# Patient Record
Sex: Female | Born: 1955
Health system: Southern US, Community
[De-identification: ages and names within clinical notes are randomized; demographics above are authoritative.]

## PROBLEM LIST (undated history)

## (undated) DIAGNOSIS — E8809 Other disorders of plasma-protein metabolism, not elsewhere classified: Secondary | ICD-10-CM

## (undated) DIAGNOSIS — E559 Vitamin D deficiency, unspecified: Secondary | ICD-10-CM

## (undated) DIAGNOSIS — F411 Generalized anxiety disorder: Secondary | ICD-10-CM

## (undated) DIAGNOSIS — R630 Anorexia: Secondary | ICD-10-CM

## (undated) DIAGNOSIS — R32 Unspecified urinary incontinence: Secondary | ICD-10-CM

## (undated) DIAGNOSIS — N816 Rectocele: Secondary | ICD-10-CM

## (undated) DIAGNOSIS — C9000 Multiple myeloma not having achieved remission: Secondary | ICD-10-CM

## (undated) DIAGNOSIS — G4700 Insomnia, unspecified: Secondary | ICD-10-CM

## (undated) DIAGNOSIS — E7800 Pure hypercholesterolemia, unspecified: Secondary | ICD-10-CM

## (undated) DIAGNOSIS — R5383 Other fatigue: Secondary | ICD-10-CM

## (undated) DIAGNOSIS — R609 Edema, unspecified: Secondary | ICD-10-CM

## (undated) DIAGNOSIS — R338 Other retention of urine: Secondary | ICD-10-CM

## (undated) DIAGNOSIS — I519 Heart disease, unspecified: Secondary | ICD-10-CM

## (undated) DIAGNOSIS — C801 Malignant (primary) neoplasm, unspecified: Secondary | ICD-10-CM

## (undated) DIAGNOSIS — K219 Gastro-esophageal reflux disease without esophagitis: Secondary | ICD-10-CM

## (undated) DIAGNOSIS — M5137 Other intervertebral disc degeneration, lumbosacral region: Secondary | ICD-10-CM

## (undated) DIAGNOSIS — M2170 Unequal limb length (acquired), unspecified site: Secondary | ICD-10-CM

## (undated) DIAGNOSIS — I351 Nonrheumatic aortic (valve) insufficiency: Secondary | ICD-10-CM

## (undated) DIAGNOSIS — G2581 Restless legs syndrome: Secondary | ICD-10-CM

## (undated) DIAGNOSIS — R112 Nausea with vomiting, unspecified: Secondary | ICD-10-CM

## (undated) DIAGNOSIS — M51379 Other intervertebral disc degeneration, lumbosacral region without mention of lumbar back pain or lower extremity pain: Secondary | ICD-10-CM

## (undated) DIAGNOSIS — M5030 Other cervical disc degeneration, unspecified cervical region: Secondary | ICD-10-CM

## (undated) DIAGNOSIS — Z9889 Other specified postprocedural states: Secondary | ICD-10-CM

## (undated) DIAGNOSIS — G4730 Sleep apnea, unspecified: Secondary | ICD-10-CM

## (undated) DIAGNOSIS — K760 Fatty (change of) liver, not elsewhere classified: Secondary | ICD-10-CM

## (undated) DIAGNOSIS — M47812 Spondylosis without myelopathy or radiculopathy, cervical region: Secondary | ICD-10-CM

## (undated) DIAGNOSIS — D649 Anemia, unspecified: Secondary | ICD-10-CM

## (undated) DIAGNOSIS — K5900 Constipation, unspecified: Secondary | ICD-10-CM

## (undated) DIAGNOSIS — I340 Nonrheumatic mitral (valve) insufficiency: Secondary | ICD-10-CM

## (undated) DIAGNOSIS — J343 Hypertrophy of nasal turbinates: Secondary | ICD-10-CM

## (undated) DIAGNOSIS — I253 Aneurysm of heart: Secondary | ICD-10-CM

## (undated) DIAGNOSIS — R7303 Prediabetes: Secondary | ICD-10-CM

## (undated) DIAGNOSIS — D219 Benign neoplasm of connective and other soft tissue, unspecified: Secondary | ICD-10-CM

## (undated) DIAGNOSIS — N9989 Other postprocedural complications and disorders of genitourinary system: Secondary | ICD-10-CM

## (undated) DIAGNOSIS — Z87448 Personal history of other diseases of urinary system: Secondary | ICD-10-CM

## (undated) DIAGNOSIS — R600 Localized edema: Secondary | ICD-10-CM

## (undated) DIAGNOSIS — R109 Unspecified abdominal pain: Secondary | ICD-10-CM

## (undated) DIAGNOSIS — D892 Hypergammaglobulinemia, unspecified: Secondary | ICD-10-CM

## (undated) DIAGNOSIS — R911 Solitary pulmonary nodule: Secondary | ICD-10-CM

## (undated) DIAGNOSIS — M47816 Spondylosis without myelopathy or radiculopathy, lumbar region: Secondary | ICD-10-CM

## (undated) DIAGNOSIS — R87629 Unspecified abnormal cytological findings in specimens from vagina: Secondary | ICD-10-CM

## (undated) DIAGNOSIS — N390 Urinary tract infection, site not specified: Secondary | ICD-10-CM

## (undated) DIAGNOSIS — J342 Deviated nasal septum: Secondary | ICD-10-CM

## (undated) DIAGNOSIS — T63441A Toxic effect of venom of bees, accidental (unintentional), initial encounter: Secondary | ICD-10-CM

## (undated) DIAGNOSIS — I071 Rheumatic tricuspid insufficiency: Secondary | ICD-10-CM

## (undated) DIAGNOSIS — R55 Syncope and collapse: Secondary | ICD-10-CM

## (undated) DIAGNOSIS — E785 Hyperlipidemia, unspecified: Secondary | ICD-10-CM

## (undated) DIAGNOSIS — G43109 Migraine with aura, not intractable, without status migrainosus: Secondary | ICD-10-CM

## (undated) DIAGNOSIS — G43909 Migraine, unspecified, not intractable, without status migrainosus: Secondary | ICD-10-CM

## (undated) DIAGNOSIS — Z973 Presence of spectacles and contact lenses: Secondary | ICD-10-CM

## (undated) DIAGNOSIS — I5189 Other ill-defined heart diseases: Secondary | ICD-10-CM

## (undated) DIAGNOSIS — R253 Fasciculation: Secondary | ICD-10-CM

## (undated) HISTORY — DX: Rectocele: N81.6

## (undated) HISTORY — DX: Anorexia: R63.0

## (undated) HISTORY — DX: Insomnia, unspecified: G47.00

## (undated) HISTORY — DX: Unspecified abnormal cytological findings in specimens from vagina: R87.629

## (undated) HISTORY — DX: Generalized anxiety disorder: F41.1

## (undated) HISTORY — DX: Prediabetes: R73.03

## (undated) HISTORY — DX: Pure hypercholesterolemia, unspecified: E78.00

## (undated) HISTORY — DX: Multiple myeloma not having achieved remission: C90.00

## (undated) HISTORY — DX: Constipation, unspecified: K59.00

## (undated) HISTORY — DX: Migraine with aura, not intractable, without status migrainosus: G43.109

## (undated) HISTORY — DX: Vitamin D deficiency, unspecified: E55.9

## (undated) HISTORY — DX: Unspecified abdominal pain: R10.9

## (undated) HISTORY — DX: Benign neoplasm of connective and other soft tissue, unspecified: D21.9

## (undated) HISTORY — PX: WISDOM TOOTH EXTRACTION: SHX21

## (undated) HISTORY — DX: Unequal limb length (acquired), unspecified site: M21.70

## (undated) HISTORY — DX: Unspecified urinary incontinence: R32

## (undated) HISTORY — DX: Migraine, unspecified, not intractable, without status migrainosus: G43.909

## (undated) HISTORY — DX: Toxic effect of venom of bees, accidental (unintentional), initial encounter: T63.441A

## (undated) HISTORY — PX: COLONOSCOPY: SHX174

## (undated) HISTORY — DX: Malignant (primary) neoplasm, unspecified: C80.1

## (undated) HISTORY — PX: BREAST BIOPSY: SHX20

## (undated) HISTORY — DX: Urinary tract infection, site not specified: N39.0

## (undated) HISTORY — DX: Other fatigue: R53.83

---

## 1995-09-26 HISTORY — PX: ABDOMINAL HYSTERECTOMY: SHX81

## 1997-09-25 DIAGNOSIS — C801 Malignant (primary) neoplasm, unspecified: Secondary | ICD-10-CM

## 1997-09-25 HISTORY — PX: THYMECTOMY: SHX1063

## 1997-09-25 HISTORY — DX: Malignant (primary) neoplasm, unspecified: C80.1

## 1998-08-13 ENCOUNTER — Ambulatory Visit (HOSPITAL_COMMUNITY): Admission: RE | Admit: 1998-08-13 | Discharge: 1998-08-13 | Payer: Self-pay | Admitting: Endocrinology

## 1998-08-13 ENCOUNTER — Encounter: Payer: Self-pay | Admitting: Endocrinology

## 1998-08-25 ENCOUNTER — Inpatient Hospital Stay (HOSPITAL_COMMUNITY): Admission: AD | Admit: 1998-08-25 | Discharge: 1998-08-28 | Payer: Self-pay | Admitting: General Surgery

## 1998-08-27 ENCOUNTER — Encounter: Payer: Self-pay | Admitting: Endocrinology

## 1998-09-02 ENCOUNTER — Encounter: Payer: Self-pay | Admitting: Thoracic Surgery

## 1998-09-02 ENCOUNTER — Inpatient Hospital Stay (HOSPITAL_COMMUNITY): Admission: RE | Admit: 1998-09-02 | Discharge: 1998-09-07 | Payer: Self-pay | Admitting: Thoracic Surgery

## 1998-09-03 ENCOUNTER — Encounter: Payer: Self-pay | Admitting: Thoracic Surgery

## 1998-09-04 ENCOUNTER — Encounter: Payer: Self-pay | Admitting: Thoracic Surgery

## 1998-09-05 ENCOUNTER — Encounter: Payer: Self-pay | Admitting: Thoracic Surgery

## 1998-09-06 ENCOUNTER — Encounter: Payer: Self-pay | Admitting: Thoracic Surgery

## 1998-09-07 ENCOUNTER — Encounter: Payer: Self-pay | Admitting: Thoracic Surgery

## 1998-09-08 ENCOUNTER — Encounter: Admission: RE | Admit: 1998-09-08 | Discharge: 1998-12-07 | Payer: Self-pay | Admitting: Radiation Oncology

## 1998-12-08 ENCOUNTER — Encounter: Admission: RE | Admit: 1998-12-08 | Discharge: 1999-03-08 | Payer: Self-pay | Admitting: Radiation Oncology

## 1999-08-26 ENCOUNTER — Encounter: Admission: RE | Admit: 1999-08-26 | Discharge: 1999-08-26 | Payer: Self-pay | Admitting: Hematology and Oncology

## 1999-08-26 ENCOUNTER — Encounter: Payer: Self-pay | Admitting: Hematology and Oncology

## 1999-09-08 ENCOUNTER — Ambulatory Visit (HOSPITAL_COMMUNITY): Admission: RE | Admit: 1999-09-08 | Discharge: 1999-09-08 | Payer: Self-pay | Admitting: Hematology and Oncology

## 1999-09-08 ENCOUNTER — Encounter: Payer: Self-pay | Admitting: Hematology and Oncology

## 1999-09-21 ENCOUNTER — Encounter: Admission: RE | Admit: 1999-09-21 | Discharge: 1999-11-07 | Payer: Self-pay | Admitting: Neurological Surgery

## 2000-01-03 ENCOUNTER — Encounter: Payer: Self-pay | Admitting: Obstetrics and Gynecology

## 2000-01-03 ENCOUNTER — Encounter: Admission: RE | Admit: 2000-01-03 | Discharge: 2000-01-03 | Payer: Self-pay | Admitting: Obstetrics and Gynecology

## 2000-02-24 ENCOUNTER — Other Ambulatory Visit: Admission: RE | Admit: 2000-02-24 | Discharge: 2000-02-24 | Payer: Self-pay | Admitting: Obstetrics and Gynecology

## 2000-02-28 ENCOUNTER — Encounter: Payer: Self-pay | Admitting: Hematology & Oncology

## 2000-02-28 ENCOUNTER — Encounter: Admission: RE | Admit: 2000-02-28 | Discharge: 2000-02-28 | Payer: Self-pay | Admitting: Hematology & Oncology

## 2000-09-04 ENCOUNTER — Encounter: Payer: Self-pay | Admitting: Hematology & Oncology

## 2000-09-04 ENCOUNTER — Encounter: Admission: RE | Admit: 2000-09-04 | Discharge: 2000-09-04 | Payer: Self-pay | Admitting: Hematology & Oncology

## 2001-02-20 ENCOUNTER — Encounter: Admission: RE | Admit: 2001-02-20 | Discharge: 2001-02-20 | Payer: Self-pay | Admitting: Hematology & Oncology

## 2001-02-20 ENCOUNTER — Encounter: Payer: Self-pay | Admitting: Hematology & Oncology

## 2001-03-27 ENCOUNTER — Other Ambulatory Visit: Admission: RE | Admit: 2001-03-27 | Discharge: 2001-03-27 | Payer: Self-pay | Admitting: Obstetrics and Gynecology

## 2001-08-12 ENCOUNTER — Encounter: Payer: Self-pay | Admitting: Hematology & Oncology

## 2001-08-12 ENCOUNTER — Encounter: Admission: RE | Admit: 2001-08-12 | Discharge: 2001-08-12 | Payer: Self-pay | Admitting: Hematology & Oncology

## 2001-09-04 ENCOUNTER — Encounter: Payer: Self-pay | Admitting: Thoracic Surgery

## 2001-09-04 ENCOUNTER — Encounter: Admission: RE | Admit: 2001-09-04 | Discharge: 2001-09-04 | Payer: Self-pay | Admitting: Thoracic Surgery

## 2002-02-04 ENCOUNTER — Encounter: Admission: RE | Admit: 2002-02-04 | Discharge: 2002-02-04 | Payer: Self-pay | Admitting: Family Medicine

## 2002-02-04 ENCOUNTER — Encounter: Payer: Self-pay | Admitting: Family Medicine

## 2002-03-05 ENCOUNTER — Encounter: Payer: Self-pay | Admitting: Thoracic Surgery

## 2002-03-05 ENCOUNTER — Encounter: Admission: RE | Admit: 2002-03-05 | Discharge: 2002-03-05 | Payer: Self-pay | Admitting: Thoracic Surgery

## 2002-04-04 ENCOUNTER — Encounter: Payer: Self-pay | Admitting: Family Medicine

## 2002-04-04 ENCOUNTER — Encounter: Admission: RE | Admit: 2002-04-04 | Discharge: 2002-04-04 | Payer: Self-pay | Admitting: Family Medicine

## 2002-09-23 ENCOUNTER — Encounter: Admission: RE | Admit: 2002-09-23 | Discharge: 2002-09-23 | Payer: Self-pay | Admitting: Thoracic Surgery

## 2002-09-23 ENCOUNTER — Encounter: Payer: Self-pay | Admitting: Thoracic Surgery

## 2002-10-29 ENCOUNTER — Encounter: Admission: RE | Admit: 2002-10-29 | Discharge: 2002-10-29 | Payer: Self-pay | Admitting: Obstetrics and Gynecology

## 2002-10-29 ENCOUNTER — Encounter: Payer: Self-pay | Admitting: Obstetrics and Gynecology

## 2003-03-26 ENCOUNTER — Encounter: Payer: Self-pay | Admitting: Thoracic Surgery

## 2003-03-26 ENCOUNTER — Encounter: Admission: RE | Admit: 2003-03-26 | Discharge: 2003-03-26 | Payer: Self-pay | Admitting: Thoracic Surgery

## 2003-09-29 ENCOUNTER — Encounter: Admission: RE | Admit: 2003-09-29 | Discharge: 2003-09-29 | Payer: Self-pay | Admitting: Thoracic Surgery

## 2004-04-28 ENCOUNTER — Encounter: Admission: RE | Admit: 2004-04-28 | Discharge: 2004-04-28 | Payer: Self-pay | Admitting: Hematology and Oncology

## 2004-06-13 ENCOUNTER — Ambulatory Visit (HOSPITAL_COMMUNITY): Admission: RE | Admit: 2004-06-13 | Discharge: 2004-06-13 | Payer: Self-pay | Admitting: Obstetrics and Gynecology

## 2004-10-20 ENCOUNTER — Encounter: Admission: RE | Admit: 2004-10-20 | Discharge: 2004-10-20 | Payer: Self-pay | Admitting: Hematology and Oncology

## 2004-11-16 ENCOUNTER — Ambulatory Visit (HOSPITAL_COMMUNITY): Admission: RE | Admit: 2004-11-16 | Discharge: 2004-11-16 | Payer: Self-pay | Admitting: Gastroenterology

## 2005-06-23 ENCOUNTER — Ambulatory Visit (HOSPITAL_BASED_OUTPATIENT_CLINIC_OR_DEPARTMENT_OTHER): Admission: RE | Admit: 2005-06-23 | Discharge: 2005-06-23 | Payer: Self-pay | Admitting: Family Medicine

## 2005-07-02 ENCOUNTER — Ambulatory Visit: Payer: Self-pay | Admitting: Internal Medicine

## 2005-07-26 ENCOUNTER — Ambulatory Visit (HOSPITAL_COMMUNITY): Admission: RE | Admit: 2005-07-26 | Discharge: 2005-07-26 | Payer: Self-pay | Admitting: Obstetrics and Gynecology

## 2005-10-23 ENCOUNTER — Encounter: Admission: RE | Admit: 2005-10-23 | Discharge: 2005-10-23 | Payer: Self-pay | Admitting: Hematology and Oncology

## 2006-01-24 ENCOUNTER — Encounter: Payer: Self-pay | Admitting: General Surgery

## 2006-07-16 ENCOUNTER — Ambulatory Visit: Payer: Self-pay | Admitting: Pulmonary Disease

## 2006-07-30 ENCOUNTER — Ambulatory Visit (HOSPITAL_COMMUNITY): Admission: RE | Admit: 2006-07-30 | Discharge: 2006-07-30 | Payer: Self-pay | Admitting: Obstetrics and Gynecology

## 2006-08-09 ENCOUNTER — Encounter: Admission: RE | Admit: 2006-08-09 | Discharge: 2006-08-09 | Payer: Self-pay | Admitting: Obstetrics and Gynecology

## 2006-08-15 ENCOUNTER — Ambulatory Visit: Payer: Self-pay | Admitting: Pulmonary Disease

## 2006-10-22 ENCOUNTER — Encounter: Admission: RE | Admit: 2006-10-22 | Discharge: 2006-10-22 | Payer: Self-pay | Admitting: Hematology and Oncology

## 2007-03-19 ENCOUNTER — Ambulatory Visit (HOSPITAL_BASED_OUTPATIENT_CLINIC_OR_DEPARTMENT_OTHER): Admission: RE | Admit: 2007-03-19 | Discharge: 2007-03-19 | Payer: Self-pay | Admitting: Pulmonary Disease

## 2007-03-30 ENCOUNTER — Ambulatory Visit: Payer: Self-pay | Admitting: Pulmonary Disease

## 2007-04-23 ENCOUNTER — Ambulatory Visit: Payer: Self-pay | Admitting: Pulmonary Disease

## 2007-04-23 LAB — CONVERTED CEMR LAB: Ferritin: 29.2 ng/mL (ref 10.0–291.0)

## 2007-08-12 ENCOUNTER — Ambulatory Visit (HOSPITAL_COMMUNITY): Admission: RE | Admit: 2007-08-12 | Discharge: 2007-08-12 | Payer: Self-pay | Admitting: Obstetrics and Gynecology

## 2007-08-16 ENCOUNTER — Encounter: Admission: RE | Admit: 2007-08-16 | Discharge: 2007-08-16 | Payer: Self-pay | Admitting: Obstetrics and Gynecology

## 2007-08-21 ENCOUNTER — Encounter: Admission: RE | Admit: 2007-08-21 | Discharge: 2007-08-21 | Payer: Self-pay | Admitting: Obstetrics and Gynecology

## 2007-10-25 ENCOUNTER — Encounter: Admission: RE | Admit: 2007-10-25 | Discharge: 2007-10-25 | Payer: Self-pay | Admitting: Hematology and Oncology

## 2007-11-06 ENCOUNTER — Encounter: Payer: Self-pay | Admitting: Pulmonary Disease

## 2007-12-03 DIAGNOSIS — G4733 Obstructive sleep apnea (adult) (pediatric): Secondary | ICD-10-CM

## 2007-12-03 DIAGNOSIS — C37 Malignant neoplasm of thymus: Secondary | ICD-10-CM | POA: Insufficient documentation

## 2007-12-03 DIAGNOSIS — G2581 Restless legs syndrome: Secondary | ICD-10-CM | POA: Insufficient documentation

## 2007-12-03 HISTORY — DX: Malignant neoplasm of thymus: C37

## 2007-12-03 HISTORY — DX: Obstructive sleep apnea (adult) (pediatric): G47.33

## 2007-12-04 ENCOUNTER — Ambulatory Visit: Payer: Self-pay | Admitting: Pulmonary Disease

## 2007-12-04 DIAGNOSIS — R0602 Shortness of breath: Secondary | ICD-10-CM

## 2007-12-04 DIAGNOSIS — E785 Hyperlipidemia, unspecified: Secondary | ICD-10-CM

## 2007-12-04 HISTORY — DX: Shortness of breath: R06.02

## 2007-12-04 HISTORY — DX: Hyperlipidemia, unspecified: E78.5

## 2007-12-05 ENCOUNTER — Ambulatory Visit (HOSPITAL_COMMUNITY): Admission: RE | Admit: 2007-12-05 | Discharge: 2007-12-05 | Payer: Self-pay | Admitting: Pulmonary Disease

## 2007-12-06 ENCOUNTER — Telehealth: Payer: Self-pay | Admitting: Pulmonary Disease

## 2007-12-06 ENCOUNTER — Ambulatory Visit: Admission: RE | Admit: 2007-12-06 | Discharge: 2007-12-06 | Payer: Self-pay | Admitting: Pulmonary Disease

## 2007-12-06 ENCOUNTER — Encounter: Payer: Self-pay | Admitting: Pulmonary Disease

## 2007-12-10 ENCOUNTER — Telehealth: Payer: Self-pay | Admitting: Pulmonary Disease

## 2007-12-16 ENCOUNTER — Telehealth: Payer: Self-pay | Admitting: Pulmonary Disease

## 2007-12-16 ENCOUNTER — Encounter: Payer: Self-pay | Admitting: Pulmonary Disease

## 2007-12-16 DIAGNOSIS — R0902 Hypoxemia: Secondary | ICD-10-CM | POA: Insufficient documentation

## 2007-12-16 HISTORY — DX: Hypoxemia: R09.02

## 2007-12-18 ENCOUNTER — Ambulatory Visit (HOSPITAL_COMMUNITY): Admission: RE | Admit: 2007-12-18 | Discharge: 2007-12-18 | Payer: Self-pay | Admitting: Pulmonary Disease

## 2007-12-27 ENCOUNTER — Telehealth: Payer: Self-pay | Admitting: Pulmonary Disease

## 2007-12-30 ENCOUNTER — Encounter: Payer: Self-pay | Admitting: Pulmonary Disease

## 2007-12-30 ENCOUNTER — Telehealth (INDEPENDENT_AMBULATORY_CARE_PROVIDER_SITE_OTHER): Payer: Self-pay | Admitting: *Deleted

## 2008-02-26 ENCOUNTER — Encounter: Payer: Self-pay | Admitting: Pulmonary Disease

## 2008-03-11 ENCOUNTER — Telehealth (INDEPENDENT_AMBULATORY_CARE_PROVIDER_SITE_OTHER): Payer: Self-pay | Admitting: *Deleted

## 2008-03-12 ENCOUNTER — Telehealth (INDEPENDENT_AMBULATORY_CARE_PROVIDER_SITE_OTHER): Payer: Self-pay | Admitting: *Deleted

## 2008-03-16 ENCOUNTER — Inpatient Hospital Stay (HOSPITAL_BASED_OUTPATIENT_CLINIC_OR_DEPARTMENT_OTHER): Admission: RE | Admit: 2008-03-16 | Discharge: 2008-03-16 | Payer: Self-pay | Admitting: Interventional Cardiology

## 2008-08-12 ENCOUNTER — Encounter: Admission: RE | Admit: 2008-08-12 | Discharge: 2008-08-12 | Payer: Self-pay | Admitting: Obstetrics and Gynecology

## 2008-10-21 ENCOUNTER — Encounter: Admission: RE | Admit: 2008-10-21 | Discharge: 2008-10-21 | Payer: Self-pay | Admitting: Hematology and Oncology

## 2009-11-25 ENCOUNTER — Encounter: Admission: RE | Admit: 2009-11-25 | Discharge: 2009-11-25 | Payer: Self-pay | Admitting: Hematology and Oncology

## 2009-11-29 ENCOUNTER — Encounter: Admission: RE | Admit: 2009-11-29 | Discharge: 2009-11-29 | Payer: Self-pay | Admitting: Obstetrics and Gynecology

## 2010-10-16 ENCOUNTER — Encounter: Payer: Self-pay | Admitting: Obstetrics and Gynecology

## 2010-11-03 ENCOUNTER — Other Ambulatory Visit (HOSPITAL_COMMUNITY): Payer: Self-pay | Admitting: Obstetrics and Gynecology

## 2010-11-03 DIAGNOSIS — Z1231 Encounter for screening mammogram for malignant neoplasm of breast: Secondary | ICD-10-CM

## 2010-12-06 ENCOUNTER — Ambulatory Visit (HOSPITAL_COMMUNITY): Payer: 59

## 2010-12-08 ENCOUNTER — Ambulatory Visit (HOSPITAL_COMMUNITY)
Admission: RE | Admit: 2010-12-08 | Discharge: 2010-12-08 | Disposition: A | Discharge status: Home / Self Care | Payer: 59 | Source: Ambulatory Visit | Attending: Obstetrics and Gynecology | Admitting: Obstetrics and Gynecology

## 2010-12-08 DIAGNOSIS — Z1231 Encounter for screening mammogram for malignant neoplasm of breast: Secondary | ICD-10-CM

## 2011-02-07 NOTE — Procedures (Signed)
Paula Johnson, Paula Johnson                   ACCOUNT NO.:  [id number]   MEDICAL RECORD NO.:  [id number]          PATIENT TYPE:  OUT   LOCATION:  SLEEP CENTER                 FACILITY:  [facility]   PHYSICIAN:  Barbaraann Share, MD,FCCPDATE OF BIRTH:  June 14, 1956   DATE OF STUDY:  03/19/2007                            NOCTURNAL POLYSOMNOGRAM   REFERRING PHYSICIAN:  Barbaraann Share, MD,FCCP   LOCATION:  Sleep Lab.   INDICATION FOR THE STUDY:  Hypersomnia with sleep apnea.   EPWORTH SLEEPINESS SCORE:  13.   MEDICATIONS:   SLEEP ARCHITECTURE:  The patient had a total sleep time of 375 minutes  with adequate slow-wave sleep for age but decreased REM.  Sleep-onset  latency was normal and REM onset was normal as well.  Sleep efficiency  was decreased at 83%.   RESPIRATORY DATA:  The patient was found to have 6 hypopneas and one  obstructive apnea for an apnea-hypopnea index of 1.1 events per hour.  The events were not positional and there was only light intermittent  snoring noted.  It should be noted that the patient's study was done  with a bruxism guard in place.   OXYGEN DATA:  There was O2 desaturation transiently to 86%, however no  prolonged episodes of O2 desaturation were noted.   CARDIAC DATA:  No clinically significant arrhythmias were present.   MOVEMENT-PARASOMNIA:  The patient was found to have 171 leg jerks with 6  per hour resulting in arousal or awakening.   IMPRESSIONS-RECOMMENDATIONS:  1. Small numbers of obstructive events which do not meet the apnea-      hypopnea index criteria for the obstructive sleep apnea syndrome.  2. Very large numbers of leg jerks with significant sleep disruption.      I suspect this represents a primary movement disorder of sleep and      is probably the etiology for the patient's disturbed sleep.      Barbaraann Share, MD,FCCP  Diplomate, American Board of Sleep  Medicine  Electronically Signed     KMC/MEDQ  D:  03/28/2007 18:47:09  T:   03/29/2007 10:49:09  Job:  [id number]

## 2011-02-07 NOTE — Cardiovascular Report (Signed)
Paula Johnson, Paula Johnson NO.:  [id number]   MEDICAL RECORD NO.:  [id number]          PATIENT TYPE:  OIB   LOCATION:  1963                         FACILITY:  [facility]   PHYSICIAN:  Lyn Records, M.D.   DATE OF BIRTH:  [birth year]   DATE OF PROCEDURE:  DATE OF DISCHARGE:                            CARDIAC CATHETERIZATION   INDICATIONS FOR PROCEDURE:  Ms. [name] has been having exertional dyspnea  and increased heart rate with moderate physical activity.  LV function  was demonstrated to be normal by echo and Cardiolite by anteroapical  ischemia was demonstrated by perfusion analysis.  This study is being  done to rule out the possibility of obstructive coronary disease related  to prior radiation therapy greater than 10 years ago as part of  management of a thymoma.   PROCEDURES PERFORMED:  1. Left heart cath.  2. Selective coronary angio.  3. Left ventriculography.   DESCRIPTION:  After informed consent, and no sedation at the patient's  request, a 4-French sheath was placed in the right femoral artery using  a modified Seldinger technique.  A 4-French A2 multipurpose catheter was  used for hemodynamic recordings, the left ventriculography by hand  injection, and native right coronary angiography.  The patient also  underwent left coronary angiography using a preformed 4-French #4 left  Judkins catheter.  The patient tolerated the procedure without  complications.   RESULTS:  1. Hemodynamic data.      a.     Aortic pressure 113/70.      b.     Left ventricular pressure 119/810.   1. Left ventriculography:  The LV cavity size and function are normal.      The end-diastolic pressure is elevated.  No mitral regurgitation is      noted.  EF is estimated to be 60%.   1. Coronary angiography.      a.     Left main coronary:  The left main coronary artery is       normal.      b.     Left anterior descending coronary:  LAD reaches the distal       anterior  interventricular groove.  It does not wrap around the       apex.  A large diagonal arises proximally.  This is in the same       region as origin of the large septal perforator.  LAD system is       normal..      c.     The circumflex artery:  Circumflex coronary artery contains       a large first obtuse marginal, a smaller second obtuse marginal,       and a large third obtuse marginal.  PDA arises distally from the       circumflex.  The entire circumflex system is normal.   D.  The right coronary:  The right coronary is nondominant and is free  of obstruction.   CONCLUSION:  1. Normal coronary arteries.  2. Normal left ventricular systolic function.  The  upper normal to      mildly elevated LVEDP raises the possibility of diastolic      dysfunction.  This could be related to some radiation-induced      myocardial fibrosis.   PLAN:  No restrictions on physical activities.  We encouraged exertional  aerobic activities.  Consider beta-blocker therapy for diastolic  dysfunction if exertional intolerance continues.      Lyn Records, M.D.  Electronically Signed     Lyn Records, M.D.  Electronically Signed    HWS/MEDQ  D:  03/16/2008  T:  03/16/2008  Job:  [id number]

## 2011-02-10 NOTE — Assessment & Plan Note (Signed)
[facility]                               PULMONARY OFFICE NOTE   Paula Johnson, Paula Johnson                            MRN:          [id number]  DATE:07/16/2006                            DOB:          14-Aug-1956    HISTORY OF PRESENT ILLNESS:  This is a 55 year old female whom I have been  asked to see for obstructive sleep apnea.  The patient was diagnosed with  mild to moderate sleep apnea in 2006, with a respiratory disturbance index  of 17 events per hour.  She ultimately underwent a CPAP titration to a final  pressure of 10 cm.  The patient was also noted to have very large numbers of  leg jerks with some sleep disruption.  The patient currently is on a full  face mask, due to frequent mouth opening, as well as heat and humidity.  Her  CPAP machine remains at 10 cm of pressure.  Her main complaint at this time  is that she has significant air gulping and will awake, where she is very,  very bloated.  This can lead to significant abdominal pain in the mornings.  She typically will awaken multiple times during the night with mask leaks,  as well as pain over the bridge of her nose.  She does not feel that mask  fit is adequate currently.  Prior to the CPAP, the patient states that she  did have snoring as well as snoring arousals.  No one had noted pauses in  her breathing during her sleep.  The patient did have sleep pressure  appearance of inactivity and would often take a nap in her car at lunch.  She has had no significant symptoms of restless leg syndrome and no one has  ever commented to her that she had difficulty with leg jerks during the  night; however, she does not have a bed partner currently.  She will  typically go to bed between 11 and 11:30, and get up at 6 a.m. to start her  day.  She is not rested upon arising prior to CPAP.   PAST MEDICAL HISTORY:  1. Significant for a history of thymic cancer with prior resection.  2. Status post  hysterectomy.   CURRENT MEDICATIONS:  1. A multivitamin daily.  2. Calcium daily.   ALLERGIES:  PENICILLIN.   SOCIAL HISTORY:  She is married and has children.  She has never smoked.   FAMILY HISTORY:  Remarkable for her father having had emphysema, as well as  heart disease.  Her son also had asthma and allergies.   REVIEW OF SYSTEMS:  As per the history of present illness, also as  documented in the chart.   PHYSICAL EXAMINATION:  GENERAL:  She is an overweight white female, in no  acute distress.  VITAL SIGNS:  Blood pressure 126/78, pulse 77, temperature 98.2 degrees,  weight 154 pounds.  O2 saturation on room air is 98%.  HEENT:  Pupils equal, round, reactive to light and accommodation.  Extraocular muscles intact.  Nares show mild septal deviation.  Oropharynx  shows normal uvula and palate.  NECK:  Supple, without jugular venous distention or lymphadenopathy.  There  is no palpable thyromegaly.  CHEST:  Totally clear.  HEART:  A regular rate and rhythm.  No murmurs, rubs or gallops.  ABDOMEN:  Soft, nontender with good bowel sounds.  GENITOURINARY/RECTAL/BREASTS:  Exam was not done and not indicated.  EXTREMITIES:  The lower extremities are without edema.  Good pulses  distally.  There is no calf tenderness.  NEUROLOGIC:  Alert and oriented, without any obvious motor deficits.   IMPRESSION:  Mild to moderate obstructive sleep apnea in the past, with  difficulty tolerating CPAP, secondary to air gulping with bloating and  abdominal pain.  Perhaps the patient would do better with a nasal mask and  the addition of a chin strap to help with mouth opening.  Patients who use a  full face mask tend to have a little bit more air gulping than those who do  not.  I also had a long discussion with the patient about the use of an oral  appliance for mild to moderate sleep apnea, and how some patients do very  well with this.  She will look this up on the Internet and give me a call  if  she is interested.   PLAN:  1. Will change the patient to a nasal mask with chin strap and decrease      her pressure to 8 cm for now, to help with air gulping and see if we      can improve tolerance.  2. The patient will do some research on her oral appliance and give me a      call if she is interested.  3. The patient will follow up in four weeks, or sooner if there are      problems.    ______________________________  Barbaraann Share, MD,FCCP    [facility]/MedQ  DD: 07/30/2006  DT: 07/31/2006  Job #: [id number]   cc:   Onalee Hua L. Annalee Genta, M.D.  Danielle L. Mahaffey, M.D.

## 2011-02-10 NOTE — Assessment & Plan Note (Signed)
[facility]                             PULMONARY OFFICE NOTE   [name], [name]                          MRN:          [id number]  DATE:04/23/2007                            DOB:          01-08-1956    SUBJECTIVE:  [name] comes in today for followup after her fitting for  an oral appliance and a follow-up sleep study.  This was done on March 19, 2007, where the patient was found to have six hypopneas and one  obstructive apnea for an apnea/hypopnea index of 1.1 per hour.  This is  much improved from her prior study that showed 17 events per hour in  2006.  The patient then wearing the oral appliance and is quite happy  with it.  She is sleeping well and has increased energy and alertness  during the day.  She was found on her follow-up study to have 171 leg  jerks with six per hour, resulting in arousal or awakening.  When I  questioned her closer, she does have symptoms that are classic with a  restless leg syndrome.   PHYSICAL EXAMINATION:  GENERAL:  She is a thin white female, in no acute  distress.  VITAL SIGNS:  Blood pressure 118/70, pulse 85, temperature 97.8 degrees,  weight 147 pounds.  O2 saturation on room air 97%.   IMPRESSION:  1. Obstructive sleep apnea, which has responded very nicely to therapy      with oral appliance:  The patient seems to be doing very well with      this and will continue.  Follow-up study shows no clinically      significant sleep disordered breathing.  2. Restless leg syndrome:  The patient has a classic history for this      and very large numbers of leg jerks on her most recent sleep study.      I really think that we need to give her a trial of Requip, to see      if this will help.  Will start it in an escalating fashion, up to 1      mg q.h.s.  She is to call me in approximately two to three weeks      and let me know how things have gone on this.   PLAN:  1. Start Requip in an escalating dose to 1  mg q.h.s.  The patient is      to give me followup on how things are going.  2. Check iron panel, in light of her restless leg syndrome.  3. The patient will follow up with Dr. [name] as needed for      maintenance of her oral appliance.  4. She is to call me if she has any return of symptoms, consistent      with sleep disordered breathing.     Barbaraann Share, MD,FCCP  Electronically Signed   KMC/MedQ  DD: 05/07/2007  DT: 05/08/2007  Job #: [id number]   cc:   [name], M.D.  Kinnie Scales. Annalee Genta, M.D.  Danielle L.  Mahaffey, M.D.

## 2011-02-10 NOTE — Op Note (Signed)
NAMECHEROKEE, BOCCIO                   ACCOUNT NO.:  [id number]   MEDICAL RECORD NO.:  [id number]          PATIENT TYPE:  AMB   LOCATION:  ENDO                         FACILITY:  [facility]   PHYSICIAN:  Anselmo Rod, M.D.  DATE OF BIRTH:  1956/05/16   DATE OF PROCEDURE:  11/16/2004  DATE OF DISCHARGE:                                 OPERATIVE REPORT   PROCEDURE PERFORMED:  Screening colonoscopy.   ENDOSCOPIST:  Charna Elizabeth, M.D.   INSTRUMENT USED:  Olympus video colonoscope.   INDICATIONS FOR PROCEDURE:  The patient is a 55 year old white female with a  personal history of cancer of the thymus (squamous cell carcinoma)  undergoing screening colonoscopy.  She has had a history of guaiac positive  stools as well.  Rule out colonic polyps, masses, etc.   PREPROCEDURE PREPARATION:  Informed consent was procured from the patient.  The patient was fasted for eight hours prior to the procedure and prepped  with a bottle of magnesium citrate and a gallon of GoLYTELY the night prior  to the procedure.  The risks and benefits of the procedure including a 10%  miss rate for colon polyps or cancers was discussed with the patient as  well.   PREPROCEDURE PHYSICAL:  The patient had stable vital signs.  Neck supple.  Surgical scar present at the base of the neck from previous thymectomy.  Chest clear to auscultation.  S1 and S2 regular.  Abdomen soft with normal  bowel sounds.   DESCRIPTION OF PROCEDURE:  The patient was placed in left lateral decubitus  position and sedated with 75 mg of Demerol and 7.5 mg of Versed in slow  incremental doses.  Once the patient was adequately sedated and maintained  on low flow oxygen and continuous cardiac monitoring, the Olympus video  colonoscope was advanced from the rectum with difficulty.  There was a large  amount of residual stool in the colon. Multiple washes were done.  Small  lesions could have been missed.  No masses, polyps, erosions or ulcerations  or diverticula were seen.  Retroflexion in the rectum revealed no  abnormalities.   IMPRESSION:  1.  Unrevealing colonoscopy up to the cecum.  2.  Significant amount of residual stool in the colon.  Multiple washes      done, small lesions could have been missed.   RECOMMENDATIONS:  1.  Repeat colonoscopy is recommended in the next five years unless the      patient develops any abnormal symptoms in the interim.  2.  Repeat guaiacs will be done in the near future and recommendations made      thereafter.      JNM/MEDQ  D:  11/16/2004  T:  11/16/2004  Job:  [id number]   cc:   Duwayne Heck L. Adonis Housekeeper, M.D.  [address]  [place] [address]  [place] [address]  Fax: [phone]   Malachi Pro. Ambrose Mantle, M.D.  [address]  [place] [address]  Fax: [phone]   Lynett Fish, M.D.  [address]  [place]  [place] [address]  Fax: [phone]

## 2011-02-10 NOTE — Procedures (Signed)
Paula Johnson, Paula Johnson                   ACCOUNT NO.:  [id number]   MEDICAL RECORD NO.:  [id number]          PATIENT TYPE:  OUT   LOCATION:  SLEEP CENTER                 FACILITY:  [facility]   PHYSICIAN:  Clinton D. Maple Hudson, M.D. DATE OF BIRTH:  [birth year]   DATE OF STUDY:  06/23/2005                              NOCTURNAL POLYSOMNOGRAM   REFERRING PHYSICIAN:  Dr. Doreatha Lew   DATE OF STUDY:  June 23, 2005   INDICATION FOR STUDY:  Insomnia with sleep apnea. Epworth sleepiness score  14/24, BMI 21.4, weight 145 pounds.   SLEEP ARCHITECTURE:  Total sleep time 329 minutes with sleep efficiency 75%.  Stage I was 19%, stage II 55%, stages III and IV 17%, REM 9% of total sleep  time. Sleep latency 5 minutes, REM latency 72 minutes, awake after sleep  onset 103 minutes, arousal index 61.6 which is increased. No bedtime  medication taken.   RESPIRATORY DATA:  Split study protocol. Apnea hypopnea index (AHI, RDI)  16.6 obstructive events per hour indicating moderate obstructive sleep  apnea/hypopnea syndrome before CPAP. There were 5 obstructive apneas and 38  hypopneas before CPAP. Events were not positional. REM AHI 2.1. CPAP was  titrated to 10 CWP, AHI 0 per hour. A petite Respironics ComfortGel nasal  mask was used with heated humidifier. Titration was difficult because of  frequent movement arousals, see below.   OXYGEN DATA:  Mild to moderate snoring with oxygen desaturation to a nadir  of 91% before CPAP. Mean oxygen saturation with CPAP control was 97% on room  air.   CARDIAC DATA:  Normal sinus rhythm.   MOVEMENT/PARASOMNIA:  A total of 246 limb jerks were recorded of which 66  were associated with arousal or awakening for an abnormal periodic limb  movement with arousal index of 12 per hour.   IMPRESSION/RECOMMENDATION:  1.  Moderate obstructive sleep apnea/hypopnea syndrome, AHI 16.6 per hour      with mild to moderate snoring and normal oxygenation. Events were not   positional.  2.  Successful continuous positive airway pressure titration to 10 CWP, AHI      0 per hour. A petite Respironics ComfortGel nasal mask was used with      heated humidifier.  3.  Significant periodic limb movement with arousal syndrome, 12 per hour.      This score may be increased during sleep disturbance associated with      continuous positive airway pressure titration but it is still likely to      reflect a significant factor affecting sleep. She may benefit from use      of a sleeping pill during initial home trial of      continuous positive airway pressure. Consider reevaluating leg jerks      clinically once she has adjusted to continuous positive airway pressure      to determine whether ongoing therapy is needed for this problem as well.      Clinton D. Maple Hudson, M.D.  Diplomate, Biomedical engineer of Sleep Medicine  Electronically Signed     CDY/MEDQ  D:  07/02/2005 84:13:24  T:  07/02/2005 10:53:07  Job:  [id number]

## 2011-11-08 ENCOUNTER — Other Ambulatory Visit (HOSPITAL_COMMUNITY): Payer: Self-pay | Admitting: Obstetrics and Gynecology

## 2011-11-08 DIAGNOSIS — Z1231 Encounter for screening mammogram for malignant neoplasm of breast: Secondary | ICD-10-CM

## 2011-12-12 ENCOUNTER — Ambulatory Visit (HOSPITAL_COMMUNITY)
Admission: RE | Admit: 2011-12-12 | Discharge: 2011-12-12 | Disposition: A | Discharge status: Home / Self Care | Payer: 59 | Source: Ambulatory Visit | Attending: Obstetrics and Gynecology | Admitting: Obstetrics and Gynecology

## 2011-12-12 DIAGNOSIS — Z1231 Encounter for screening mammogram for malignant neoplasm of breast: Secondary | ICD-10-CM | POA: Insufficient documentation

## 2011-12-25 ENCOUNTER — Ambulatory Visit
Admission: RE | Admit: 2011-12-25 | Discharge: 2011-12-25 | Disposition: A | Discharge status: Home / Self Care | Payer: 59 | Source: Ambulatory Visit | Attending: Hematology and Oncology | Admitting: Hematology and Oncology

## 2011-12-25 ENCOUNTER — Other Ambulatory Visit: Payer: Self-pay | Admitting: Hematology and Oncology

## 2011-12-25 DIAGNOSIS — C37 Malignant neoplasm of thymus: Secondary | ICD-10-CM

## 2012-10-08 ENCOUNTER — Other Ambulatory Visit: Payer: Self-pay | Admitting: Dermatology

## 2013-01-22 ENCOUNTER — Other Ambulatory Visit (HOSPITAL_COMMUNITY): Payer: Self-pay | Admitting: Obstetrics and Gynecology

## 2013-01-22 DIAGNOSIS — Z1231 Encounter for screening mammogram for malignant neoplasm of breast: Secondary | ICD-10-CM

## 2013-01-23 ENCOUNTER — Ambulatory Visit (HOSPITAL_COMMUNITY)
Admission: RE | Admit: 2013-01-23 | Discharge: 2013-01-23 | Disposition: A | Discharge status: Home / Self Care | Payer: 59 | Source: Ambulatory Visit | Attending: Obstetrics and Gynecology | Admitting: Obstetrics and Gynecology

## 2013-01-23 DIAGNOSIS — Z1231 Encounter for screening mammogram for malignant neoplasm of breast: Secondary | ICD-10-CM | POA: Insufficient documentation

## 2013-01-27 ENCOUNTER — Other Ambulatory Visit: Payer: Self-pay | Admitting: Obstetrics and Gynecology

## 2013-01-27 DIAGNOSIS — R928 Other abnormal and inconclusive findings on diagnostic imaging of breast: Secondary | ICD-10-CM

## 2013-02-06 ENCOUNTER — Other Ambulatory Visit: Payer: Self-pay | Admitting: Obstetrics and Gynecology

## 2013-02-06 ENCOUNTER — Ambulatory Visit
Admission: RE | Admit: 2013-02-06 | Discharge: 2013-02-06 | Disposition: A | Discharge status: Home / Self Care | Payer: 59 | Source: Ambulatory Visit | Attending: Obstetrics and Gynecology | Admitting: Obstetrics and Gynecology

## 2013-02-06 DIAGNOSIS — R928 Other abnormal and inconclusive findings on diagnostic imaging of breast: Secondary | ICD-10-CM

## 2013-02-11 ENCOUNTER — Other Ambulatory Visit: Payer: Self-pay | Admitting: Obstetrics and Gynecology

## 2013-02-11 ENCOUNTER — Ambulatory Visit
Admission: RE | Admit: 2013-02-11 | Discharge: 2013-02-11 | Disposition: A | Discharge status: Home / Self Care | Payer: 59 | Source: Ambulatory Visit | Attending: Obstetrics and Gynecology | Admitting: Obstetrics and Gynecology

## 2013-02-11 DIAGNOSIS — R928 Other abnormal and inconclusive findings on diagnostic imaging of breast: Secondary | ICD-10-CM

## 2015-01-11 ENCOUNTER — Encounter: Payer: Self-pay | Admitting: Obstetrics and Gynecology

## 2015-01-27 ENCOUNTER — Encounter: Payer: Self-pay | Admitting: Obstetrics and Gynecology

## 2015-01-27 ENCOUNTER — Ambulatory Visit (INDEPENDENT_AMBULATORY_CARE_PROVIDER_SITE_OTHER): Payer: 59 | Admitting: Obstetrics and Gynecology

## 2015-01-27 VITALS — BP 120/74 | HR 80 | Resp 16 | Ht 68.0 in | Wt 132.0 lb

## 2015-01-27 DIAGNOSIS — N993 Prolapse of vaginal vault after hysterectomy: Secondary | ICD-10-CM

## 2015-01-27 DIAGNOSIS — N8110 Cystocele, unspecified: Secondary | ICD-10-CM

## 2015-01-27 DIAGNOSIS — IMO0002 Reserved for concepts with insufficient information to code with codable children: Secondary | ICD-10-CM

## 2015-01-27 DIAGNOSIS — N393 Stress incontinence (female) (male): Secondary | ICD-10-CM

## 2015-01-27 DIAGNOSIS — N816 Rectocele: Secondary | ICD-10-CM | POA: Diagnosis not present

## 2015-01-27 NOTE — Progress Notes (Signed)
Patient ID: Paula Johnson, female   DOB: 1956/03/28, 59 y.o.   MRN: [id number] GYNECOLOGY  VISIT   HPI: 59 y.o.   Married  Caucasian  female   G2P2002 with No LMP recorded. Patient has had a hysterectomy.   here for evaluation of vaginal prolapse which is progressive. Status post TAH - fibroids - 1997 - Dr. Ulanda Edison. Ovaries remain.   Pushes the prolapse back up.  Some decrease in stream of flow of urine.  Uncertain if emptying completely.  Told she has a cystocele and rectocele.  After a BM has to replace the prolapse.  No loss of control of stool.  Occasional leak of urine with sneeze or exercise such as yoga.  Just a little leak.   History of microscopic hematuria on occasion.  Told unremarkable by urology. History of UTIs in past.  Saw Dr. Terance Hart for this 20 years ago and was on abx for 3 months.  History of radiation to thymus due to malignancy.  Told she has already has the maximum possible.  She is very concerned about mammograms.   Used progesterone cream OTC.  Uses this to treat headaches.   Sees Dr. Ulanda Edison for annual exams.   GYNECOLOGIC HISTORY: No LMP recorded. Patient has had a hysterectomy. Contraception:  Hysterectomy  Menopausal hormone therapy: none Last pap:  2015 NFA:OZHYQM of HPV testing Last mammogram: 01-24-13 fibroglandular pattern; Rt. Breast possible mass, underwent ultrasound guided biopsy and cyst collapsed during biopsy procedure:[facility]        OB History    Gravida Para Term Preterm AB TAB SAB Ectopic Multiple Living   2 2 2       2          Patient Active Problem List   Diagnosis Date Noted  . HYPOXEMIA 12/16/2007  . HYPERLIPIDEMIA 12/04/2007  . DYSPNEA 12/04/2007  . MALIGNANT NEOPLASM OF THYMUS 12/03/2007  . OBSTRUCTIVE SLEEP APNEA 12/03/2007  . RESTLESS LEGS SYNDROME 12/03/2007    Past Medical History  Diagnosis Date  . Cancer 1999    squamous cell carcinoma of thymus  . Prediabetes   . Fibroid     reason for  Hysterectomy  . Urinary incontinence     Past Surgical History  Procedure Laterality Date  . Thymectomy  1999  . Abdominal hysterectomy  1997    TAH--ovaries remain--Dr. Newton Pigg    Current Outpatient Prescriptions  Medication Sig Dispense Refill  . co-enzyme Q-10 30 MG capsule Take 30 mg by mouth 3 (three) times daily.    Marland Kitchen UNABLE TO FIND Med Name: Provex CV Takes daily    . UNABLE TO FIND Med Name:  Recover A1 Takes daily    . UNABLE TO FIND Med Name: [place] UNABLE TO FIND Med Name: Replenex Extra Strength Takes daily    . UNABLE TO FIND Med Name: Vitality [place] Omega-3 Takes daily    . UNABLE TO FIND Med Name: Acuity [place] Takes daily    . UNABLE TO FIND Med Name: CardiOmega EPA Takes daily    . UNABLE TO FIND Med Name: NutraView Takes daily    . UNABLE TO FIND Med Name: CellWise Takes daily    . UNABLE TO FIND Med Name: GHR Complex (growth hormone releaser) Takes daily     No current facility-administered medications for this visit.     ALLERGIES: Ampicillin  Family History  Problem Relation Age of Onset  . COPD Father  dec age 72  . Diabetes Brother   . Other Brother     committed suicide age 56  . Hyperlipidemia Mother   . Diabetes Maternal Grandmother     History   Social History  . Marital Status: Widowed    Spouse Name: N/A  . Number of Children: N/A  . Years of Education: N/A   Occupational History  . Not on file.   Social History Main Topics  . Smoking status: Never Smoker   . Smokeless tobacco: Not on file  . Alcohol Use: No  . Drug Use: No  . Sexual Activity:    Partners: Male    Birth Control/ Protection: Surgical     Comment: TAH--ovaries remain   Other Topics Concern  . Not on file   Social History Narrative  . No narrative on file    ROS:  Pertinent items are noted in HPI.  PHYSICAL EXAMINATION:    BP 120/74 mmHg  Pulse 80  Resp 16  Ht 5\' 8"  (1.727 m)  Wt 132 lb (59.875 kg)  BMI 20.08 kg/m2      General appearance: alert, cooperative and appears stated age Lungs: clear to auscultation bilaterally Heart: regular rate and rhythm Abdomen: soft, non-tender; no masses,  no organomegaly No abnormal inguinal nodes palpated  Pelvic: External genitalia:  no lesions              Urethra:  normal appearing urethra with no masses, tenderness or lesions              Bartholins and Skenes: normal                 Vagina: normal appearing vagina with normal color and discharge, no lesions.  Third degree cystocele.  Almost second vaginal vault prolapse.  First degree rectocele.               Cervix: normal appearance                   Bimanual Exam:  Uterus:   absent                                      Adnexa: normal adnexa in size, nontender and no masses                                      Rectovaginal:  Yes.                                                                Confirms                                      Anus:  normal sphincter tone, no lesions  ASSESSMENT  Status post TAH.  Pelvic organ prolapse.  Mild genuine stress incontinence. History of microscopic hematuria.   PLAN  I have had a comprehensive discussion with the patient regarding prolapse and urinary incontinence.  I have provided reading materials from [employer] regarding prolapse and incontinence in general as  well as medical and surgical treatment for these conditions. Medical treatments may include physical therapy, pessary use, anticholinergic/antimuscarinic therapy.   We discussed routes of prolapse surgery including - abdominal sacrocolpopexy with permanent graft  and anterior and posterior colporrhaphy. - laparoscopic sacrocolpopexy with permanent graft and anterior and posterior colporrhaphy.  Patient understands that I currently do not perform this procedure.  - vaginal approach with anterior and posterior colporrhaphy with possible sacrospinous fixation using native tissue repair or augmentation with bovine  or porcine graft.  We discussed graft issues using either biological or permanent grafts.   We reviewed risks of recurrence of prolapse being up to 30% and that a sacrocolpopexy would be less likely to result in recurrent prolapse.   We discussed midurethral slings for urinary stress incontinence.  We reviewed urodynamic testing prior to proceeding with any surgical repair.  Patient is interested in returning for a pessary fitting and will schedule a follow up appointment for this.   She may consider physical therapy with Ileana Roup at [facility].    An After Visit Summary was printed and given to the patient.  __45____ minutes face to face time of which over 50% was spent in counseling.

## 2015-02-03 ENCOUNTER — Ambulatory Visit (INDEPENDENT_AMBULATORY_CARE_PROVIDER_SITE_OTHER): Payer: 59 | Admitting: Obstetrics and Gynecology

## 2015-02-03 ENCOUNTER — Encounter: Payer: Self-pay | Admitting: Obstetrics and Gynecology

## 2015-02-03 VITALS — BP 110/70 | HR 92 | Resp 16 | Ht 68.0 in | Wt 132.0 lb

## 2015-02-03 DIAGNOSIS — N993 Prolapse of vaginal vault after hysterectomy: Secondary | ICD-10-CM

## 2015-02-03 NOTE — Progress Notes (Signed)
GYNECOLOGY  VISIT   HPI: 59 y.o.   Widowed  Caucasian  female   G2P2002 with No LMP recorded. Patient has had a hysterectomy.   here for  Pessary Fitting. Has mild urinary incontinence also - stress.   GYNECOLOGIC HISTORY: No LMP recorded. Patient has had a hysterectomy. Contraception: Post Hysterectomy  Menopausal hormone therapy: OTC Progesterone cream Last mammogram: 01/2013 BIRADS4: suspicious abnormality. [place] right breast Bx revealed fibrocystic change, No evidence of malignancy.  Last pap smear: 2015 Normal         OB History    Gravida Para Term Preterm AB TAB SAB Ectopic Multiple Living   2 2 2       2          Patient Active Problem List   Diagnosis Date Noted  . HYPOXEMIA 12/16/2007  . HYPERLIPIDEMIA 12/04/2007  . DYSPNEA 12/04/2007  . MALIGNANT NEOPLASM OF THYMUS 12/03/2007  . OBSTRUCTIVE SLEEP APNEA 12/03/2007  . RESTLESS LEGS SYNDROME 12/03/2007    Past Medical History  Diagnosis Date  . Cancer 1999    squamous cell carcinoma of thymus  . Prediabetes   . Fibroid     reason for Hysterectomy  . Urinary incontinence     Past Surgical History  Procedure Laterality Date  . Thymectomy  1999  . Abdominal hysterectomy  1997    TAH--ovaries remain--Dr. Newton Pigg    Current Outpatient Prescriptions  Medication Sig Dispense Refill  . co-enzyme Q-10 30 MG capsule Take 30 mg by mouth 3 (three) times daily.    . Estradiol-Estriol-Progesterone (BIEST/PROGESTERONE) CREA Place onto the skin.    Marland Kitchen UNABLE TO FIND Med Name: Provex CV Takes daily    . UNABLE TO FIND Med Name:  Recover A1 Takes daily    . UNABLE TO FIND Med Name: [place] UNABLE TO FIND Med Name: Replenex Extra Strength Takes daily    . UNABLE TO FIND Med Name: Vitality [place] Omega-3 Takes daily    . UNABLE TO FIND Med Name: Acuity [place] Takes daily    . UNABLE TO FIND Med Name: CardiOmega EPA Takes daily    . UNABLE TO FIND Med Name: NutraView Takes daily    . UNABLE TO FIND  Med Name: CellWise Takes daily    . UNABLE TO FIND Med Name: GHR Complex (growth hormone releaser) Takes daily     No current facility-administered medications for this visit.     ALLERGIES: Ampicillin  Family History  Problem Relation Age of Onset  . COPD Father     dec age 54  . Diabetes Brother   . Other Brother     committed suicide age 15  . Hyperlipidemia Mother   . Diabetes Maternal Grandmother     History   Social History  . Marital Status: Widowed    Spouse Name: N/A  . Number of Children: N/A  . Years of Education: N/A   Occupational History  . Not on file.   Social History Main Topics  . Smoking status: Never Smoker   . Smokeless tobacco: Not on file  . Alcohol Use: No  . Drug Use: No  . Sexual Activity:    Partners: Male    Birth Control/ Protection: Surgical     Comment: TAH--ovaries remain   Other Topics Concern  . Not on file   Social History Narrative  . No narrative on file    ROS:  Pertinent items are noted in HPI.  PHYSICAL EXAMINATION:    BP 110/70 mmHg  Pulse 92  Resp 16  Ht 5\' 8"  (1.727 m)  Wt 132 lb (59.875 kg)  BMI 20.08 kg/m2     General appearance: alert, cooperative and appears stated age  Pelvic: External genitalia:  no lesions              Urethra:  normal appearing urethra with no masses, tenderness or lesions              Bartholins and Skenes: normal                 Vagina: normal appearing vagina with normal color and discharge, no lesions.  Third degree cystocele and second degree apical prolapse.               Cervix:  absent                   Bimanual Exam:  Uterus:   absent                                      Adnexa: normal adnexa in size, nontender and no masses                                     Pessary fitting performed.  Patient was able to push out largest incontinence dish #5) with support.  Ring pessary with support #6 comfortable and did not come out with maneuvers.    ASSESSMENT  Mild stress  incontinence.  Post hysterectomy vaginal prolapse.  PLAN  #6 ring with support pessary fitted.  Will have patient return when pessary arrives.  Pessary care discussed. Discussed local vaginal estrogen which we will not use at this time.   An After Visit Summary was printed and given to the patient.  __15____ minutes face to face time of which over 50% was spent in counseling.

## 2015-02-08 ENCOUNTER — Telehealth: Payer: Self-pay | Admitting: *Deleted

## 2015-02-08 NOTE — Telephone Encounter (Signed)
Left voicemail for patient to call back to make new pessary insertion appt.

## 2015-02-11 ENCOUNTER — Telehealth: Payer: Self-pay | Admitting: Obstetrics and Gynecology

## 2015-02-11 NOTE — Telephone Encounter (Signed)
Pt states she was just made aware of a conference call that she is scheduled for that is the same time as her 9:30am appointment on 02/17/15 with Dr Quincy Simmonds. Pt would like to reschedule her office visit to something sooner if possible.

## 2015-02-11 NOTE — Telephone Encounter (Signed)
Left message to call Kaitlyn at [phone]. 

## 2015-02-12 NOTE — Telephone Encounter (Signed)
Spoke with patient. Patient would like to move 02/17/2015 for pessary placement up if possible. Advised no current appointments available before 5/25 as Dr>Silva will be out of the office 5/23 and 5/24 performing surgery. Patient is agreeable and will keep appointment as scheduled for 5/25 at 9:30am with Dr.Silva.  Routing to provider for final review. Patient agreeable to disposition. Will close encounter.

## 2015-02-17 ENCOUNTER — Ambulatory Visit (INDEPENDENT_AMBULATORY_CARE_PROVIDER_SITE_OTHER): Payer: 59 | Admitting: Obstetrics and Gynecology

## 2015-02-17 ENCOUNTER — Encounter: Payer: Self-pay | Admitting: Obstetrics and Gynecology

## 2015-02-17 VITALS — BP 118/80 | HR 80 | Resp 16 | Wt 131.0 lb

## 2015-02-17 DIAGNOSIS — N993 Prolapse of vaginal vault after hysterectomy: Secondary | ICD-10-CM

## 2015-02-17 NOTE — Patient Instructions (Signed)
Please clean pessary with antibacterial soap and water.   Call for any vaginal bleeding.

## 2015-02-17 NOTE — Progress Notes (Signed)
Patient ID: Paula Johnson, female   DOB: [birth year], 59 y.o.   MRN: [id number] GYNECOLOGY  VISIT   HPI: 59 y.o.   Widowed  Caucasian  female   G2P2002 with No LMP recorded. Patient has had a hysterectomy.   here for pessary insertion.    GYNECOLOGIC HISTORY: No LMP recorded. Patient has had a hysterectomy. Contraception: hysterectomy Menopausal hormone therapy: OTC Progesterone cream Last mammogram: 01/2013 Bi Rad 4: suspicious abnormality,  [place] Rt. Breast Bx revealed fibrocystic change, no evidence of malignancy. Last pap smear: 2015 normal        OB History    Gravida Para Term Preterm AB TAB SAB Ectopic Multiple Living   2 2 2       2          Patient Active Problem List   Diagnosis Date Noted  . HYPOXEMIA 12/16/2007  . HYPERLIPIDEMIA 12/04/2007  . DYSPNEA 12/04/2007  . MALIGNANT NEOPLASM OF THYMUS 12/03/2007  . OBSTRUCTIVE SLEEP APNEA 12/03/2007  . RESTLESS LEGS SYNDROME 12/03/2007    Past Medical History  Diagnosis Date  . Cancer 1999    squamous cell carcinoma of thymus  . Prediabetes   . Fibroid     reason for Hysterectomy  . Urinary incontinence     Past Surgical History  Procedure Laterality Date  . Thymectomy  1999  . Abdominal hysterectomy  1997    TAH--ovaries remain--Dr. Newton Pigg    Current Outpatient Prescriptions  Medication Sig Dispense Refill  . co-enzyme Q-10 30 MG capsule Take 30 mg by mouth 3 (three) times daily.    . Estradiol-Estriol-Progesterone (BIEST/PROGESTERONE) CREA Place onto the skin.    Marland Kitchen UNABLE TO FIND Med Name: Provex CV Takes daily    . UNABLE TO FIND Med Name:  Recover A1 Takes daily    . UNABLE TO FIND Med Name: [place] UNABLE TO FIND Med Name: Replenex Extra Strength Takes daily    . UNABLE TO FIND Med Name: Vitality [place] Omega-3 Takes daily    . UNABLE TO FIND Med Name: Acuity [place] Takes daily    . UNABLE TO FIND Med Name: CardiOmega EPA Takes daily    . UNABLE TO FIND Med Name: NutraView Takes  daily    . UNABLE TO FIND Med Name: CellWise Takes daily    . UNABLE TO FIND Med Name: GHR Complex (growth hormone releaser) Takes daily     No current facility-administered medications for this visit.     ALLERGIES: Ampicillin  Family History  Problem Relation Age of Onset  . COPD Father     dec age 38  . Diabetes Brother   . Other Brother     committed suicide age 78  . Hyperlipidemia Mother   . Diabetes Maternal Grandmother     History   Social History  . Marital Status: Widowed    Spouse Name: N/A  . Number of Children: N/A  . Years of Education: N/A   Occupational History  . Not on file.   Social History Main Topics  . Smoking status: Never Smoker   . Smokeless tobacco: Not on file  . Alcohol Use: No  . Drug Use: No  . Sexual Activity:    Partners: Male    Birth Control/ Protection: Surgical     Comment: TAH--ovaries remain   Other Topics Concern  . Not on file   Social History Narrative    ROS:  Pertinent items are noted in  HPI.  PHYSICAL EXAMINATION:    There were no vitals taken for this visit.    General appearance: alert, cooperative and appears stated age   56 fitting and placement.   Premier ring with support pessary - Lot number [id number] - fitted and placed.  Comfortable with many maneuvers.  Patient able to place, remove and void following placement.   Chaperone was present for exam.  ASSESSMENT  Post hysterectomy prolapse of vaginal vault. Successful pessary placement.   PLAN  Counseled regarding care of pessary. Return in  1 -2 weeks, sooner if develops bleeding.   An After Visit Summary was printed and given to the patient.  __10____ minutes face to face time of which over 50% was spent in counseling.

## 2015-03-01 ENCOUNTER — Telehealth: Payer: Self-pay | Admitting: Obstetrics and Gynecology

## 2015-03-01 NOTE — Telephone Encounter (Signed)
Spoke with patient. Patient had pessary inserted on 02/17/2015 with Dr.Silva. Patient would like to set up pessary recheck at this time. Requesting appointment for this week as she will be out of town next week. Appointment scheduled for 6/8 at 11am with Dr.Silva. Patient is agreeable to date and time.  Routing to provider for final review. Patient agreeable to disposition. Will close encounter.

## 2015-03-01 NOTE — Telephone Encounter (Signed)
Patient states that Dr. Quincy Simmonds said she wanted her to come in for a check up. Patient states she is going out of town in a week so she needs call back ASAP.

## 2015-03-03 ENCOUNTER — Ambulatory Visit (INDEPENDENT_AMBULATORY_CARE_PROVIDER_SITE_OTHER): Payer: 59 | Admitting: Obstetrics and Gynecology

## 2015-03-03 ENCOUNTER — Encounter: Payer: Self-pay | Admitting: Obstetrics and Gynecology

## 2015-03-03 VITALS — BP 110/70 | HR 70 | Ht 68.0 in | Wt 131.0 lb

## 2015-03-03 DIAGNOSIS — N993 Prolapse of vaginal vault after hysterectomy: Secondary | ICD-10-CM | POA: Diagnosis not present

## 2015-03-03 NOTE — Progress Notes (Signed)
Patient ID: Paula Johnson, female   DOB: 09-08-1956, 59 y.o.   MRN: [id number] GYNECOLOGY  VISIT   HPI: 59 y.o.   Widowed  Caucasian  female   G2P2002 with No LMP recorded. Patient has had a hysterectomy.   here for pessary check. Patient states leaking more with urination since pessary insertion.  She also states at times, pessary does come out with bowel movements.  Premier ring with support pessary - Lot number [id number] - fitted and placed with last office visit.  Having stress incontinence.  Wears a panty line.  Takes pessary out to have a BM.  Overall still feels this is better than having surgery.  No pain or bleeding.   GYNECOLOGIC HISTORY: No LMP recorded. Patient has had a hysterectomy. Contraception: Hysterectomy Menopausal hormone therapy: n/a Last mammogram:  01/2013 Bi Rads 4: suspicious abnormality.  [place] Rt. Breast Bx revealed fibrocystic change, no evidence of malignancy. Last pap smear: 2015 normal        OB History    Gravida Para Term Preterm AB TAB SAB Ectopic Multiple Living   2 2 2       2          Patient Active Problem List   Diagnosis Date Noted  . HYPOXEMIA 12/16/2007  . HYPERLIPIDEMIA 12/04/2007  . DYSPNEA 12/04/2007  . MALIGNANT NEOPLASM OF THYMUS 12/03/2007  . OBSTRUCTIVE SLEEP APNEA 12/03/2007  . RESTLESS LEGS SYNDROME 12/03/2007    Past Medical History  Diagnosis Date  . Cancer 1999    squamous cell carcinoma of thymus  . Prediabetes   . Fibroid     reason for Hysterectomy  . Urinary incontinence     Past Surgical History  Procedure Laterality Date  . Thymectomy  1999  . Abdominal hysterectomy  1997    TAH--ovaries remain--Dr. Newton Pigg    Current Outpatient Prescriptions  Medication Sig Dispense Refill  . co-enzyme Q-10 30 MG capsule Take 30 mg by mouth 3 (three) times daily.    . Estradiol-Estriol-Progesterone (BIEST/PROGESTERONE) CREA Place onto the skin.    Marland Kitchen UNABLE TO FIND Med Name: Provex CV Takes daily    . UNABLE TO FIND  Med Name:  Recover A1 Takes daily    . UNABLE TO FIND Med Name: [place] UNABLE TO FIND Med Name: Replenex Extra Strength Takes daily    . UNABLE TO FIND Med Name: Vitality [place] Omega-3 Takes daily    . UNABLE TO FIND Med Name: Acuity [place] Takes daily    . UNABLE TO FIND Med Name: CardiOmega EPA Takes daily    . UNABLE TO FIND Med Name: NutraView Takes daily    . UNABLE TO FIND Med Name: CellWise Takes daily    . UNABLE TO FIND Med Name: GHR Complex (growth hormone releaser) Takes daily     No current facility-administered medications for this visit.     ALLERGIES: Ampicillin  Family History  Problem Relation Age of Onset  . COPD Father     dec age 61  . Diabetes Brother   . Other Brother     committed suicide age 66  . Hyperlipidemia Mother   . Diabetes Maternal Grandmother     History   Social History  . Marital Status: Widowed    Spouse Name: N/A  . Number of Children: N/A  . Years of Education: N/A   Occupational History  . Not on file.   Social History Main Topics  .  Smoking status: Never Smoker   . Smokeless tobacco: Never Used  . Alcohol Use: No  . Drug Use: No  . Sexual Activity:    Partners: Male    Birth Control/ Protection: Surgical     Comment: TAH--ovaries remain   Other Topics Concern  . Not on file   Social History Narrative    ROS:  Pertinent items are noted in HPI.  PHYSICAL EXAMINATION:    BP 110/70 mmHg  Pulse 70  Ht 5\' 8"  (1.727 m)  Wt 131 lb (59.421 kg)  BMI 19.92 kg/m2    General appearance: alert, cooperative and appears stated age    Pelvic: External genitalia:  no lesions              Urethra:  normal appearing urethra with no masses, tenderness or lesions              Bartholins and Skenes: normal                 Vagina:  Vaginal vault with 3 cm erythema at cuff.              Cervix: absent             Pessary ring with support removed, cleansed and replaced at the end of the visit.   #5  incontinence dish fitted and comfortable.  Did maneuvers and did not fall out . Will order this for patient per her request.   Chaperone was present for exam.  ASSESSMENT  Incomplete vaginal vault prolapse.  Ring with support causing stress incontinence. Mild erythema at cuff but no erosion or ulcer noted.   PLAN   Will order #5 incontinence dish.  Mechanism of action of in incontinence dish discussed.  Patient would like to try this. Consider estrogen cream.   An After Visit Summary was printed and given to the patient.  __15____ minutes face to face time of which over 50% was spent in counseling.

## 2015-03-10 ENCOUNTER — Telehealth: Payer: Self-pay

## 2015-03-10 NOTE — Telephone Encounter (Signed)
Spoke with patient and notified received pessary.  Made appointment for pessary insertion for 04-05-15 2:30pm.

## 2015-03-26 ENCOUNTER — Ambulatory Visit: Payer: Self-pay | Admitting: Obstetrics and Gynecology

## 2015-04-05 ENCOUNTER — Ambulatory Visit (INDEPENDENT_AMBULATORY_CARE_PROVIDER_SITE_OTHER): Payer: 59 | Admitting: Obstetrics and Gynecology

## 2015-04-05 ENCOUNTER — Encounter: Payer: Self-pay | Admitting: Obstetrics and Gynecology

## 2015-04-05 VITALS — BP 120/78 | HR 86 | Ht 68.0 in | Wt 130.2 lb

## 2015-04-05 DIAGNOSIS — N393 Stress incontinence (female) (male): Secondary | ICD-10-CM | POA: Diagnosis not present

## 2015-04-05 DIAGNOSIS — N993 Prolapse of vaginal vault after hysterectomy: Secondary | ICD-10-CM

## 2015-04-05 NOTE — Progress Notes (Signed)
GYNECOLOGY  VISIT   HPI: 59 y.o.   Widowed  Caucasian  female   G2P2002 with No LMP recorded. Patient has had a hysterectomy.   here for pessary insertion #5 incontinence dish has arrived.  Had stress incontinence with pessary ring with support.  Patient is able to remove and replace pessary.   Not ready for surgical intervention at this time.  GYNECOLOGIC HISTORY: No LMP recorded. Patient has had a hysterectomy. Contraception: TAH Menopausal hormone therapy: Biest/Progesterone Cream Last mammogram: 02/11/13 breast biopsy fibrocystic changes-benign Last pap smear: 2015 wnl        OB History    Gravida Para Term Preterm AB TAB SAB Ectopic Multiple Living   2 2 2       2          Patient Active Problem List   Diagnosis Date Noted  . HYPOXEMIA 12/16/2007  . HYPERLIPIDEMIA 12/04/2007  . DYSPNEA 12/04/2007  . MALIGNANT NEOPLASM OF THYMUS 12/03/2007  . OBSTRUCTIVE SLEEP APNEA 12/03/2007  . RESTLESS LEGS SYNDROME 12/03/2007    Past Medical History  Diagnosis Date  . Cancer 1999    squamous cell carcinoma of thymus  . Prediabetes   . Fibroid     reason for Hysterectomy  . Urinary incontinence     Past Surgical History  Procedure Laterality Date  . Thymectomy  1999  . Abdominal hysterectomy  1997    TAH--ovaries remain--Dr. Newton Pigg    Current Outpatient Prescriptions  Medication Sig Dispense Refill  . co-enzyme Q-10 30 MG capsule Take 30 mg by mouth 3 (three) times daily.    . Estradiol-Estriol-Progesterone (BIEST/PROGESTERONE) CREA Place onto the skin.    Marland Kitchen UNABLE TO FIND Med Name: Provex CV Takes daily    . UNABLE TO FIND Med Name:  Recover A1 Takes daily    . UNABLE TO FIND Med Name: [place] UNABLE TO FIND Med Name: Replenex Extra Strength Takes daily    . UNABLE TO FIND Med Name: Vitality [place] Omega-3 Takes daily    . UNABLE TO FIND Med Name: Acuity [place] Takes daily    . UNABLE TO FIND Med Name: CardiOmega EPA Takes daily    . UNABLE  TO FIND Med Name: NutraView Takes daily    . UNABLE TO FIND Med Name: CellWise Takes daily    . UNABLE TO FIND Med Name: GHR Complex (growth hormone releaser) Takes daily     No current facility-administered medications for this visit.     ALLERGIES: Ampicillin  Family History  Problem Relation Age of Onset  . COPD Father     dec age 15  . Diabetes Brother   . Other Brother     committed suicide age 79  . Hyperlipidemia Mother   . Diabetes Maternal Grandmother     History   Social History  . Marital Status: Widowed    Spouse Name: N/A  . Number of Children: N/A  . Years of Education: N/A   Occupational History  . Not on file.   Social History Main Topics  . Smoking status: Never Smoker   . Smokeless tobacco: Never Used  . Alcohol Use: No  . Drug Use: No  . Sexual Activity:    Partners: Male    Birth Control/ Protection: Surgical     Comment: TAH--ovaries remain   Other Topics Concern  . Not on file   Social History Narrative    ROS:  Pertinent items are noted in  HPI.  PHYSICAL EXAMINATION:    Ht 5\' 8"  (1.727 m)    General appearance: alert, cooperative and appears stated age   Pessary ring with support removed, cleansed, and given to patient in a biobag. New pessary placed Integra Miltex. #5 incontinence dish - lot number [id number].  Pessary comfortable and patient able to do maneuvers and maintain the pessary comfortably.   Chaperone was present for exam.  ASSESSMENT  Vaginal vault prolapse.  Stress incontinence with reduction of prolapse. Status post TAH.  PLAN  Counseled regarding use and goals of the incontinence dish. Will return prn.    An After Visit Summary was printed and given to the patient.  ___10__ minutes face to face time of which over 50% was spent in counseling.

## 2015-04-21 ENCOUNTER — Encounter: Payer: Self-pay | Admitting: Obstetrics and Gynecology

## 2015-05-10 ENCOUNTER — Telehealth: Payer: Self-pay | Admitting: Obstetrics and Gynecology

## 2015-05-10 NOTE — Telephone Encounter (Signed)
Spoke with patient. Patient states that she had light spotting on 05/06/2015 with pessary. Was also experiencing a thick "mucous" discharge. Patient removed her pessary on 05/07/2015. Has not experiencing any further bleeding or discharge. Can not remember the last time she removed and replaced her pessary but feels it was close to the time of having some bleeding. Placed and removed pessary on Saturday 8/13 to go dancing. Denies any problems with reinsertion and removal. Requesting to be seen with Dr.Silva for recheck. Offered appointment on Wednesday but patient declines due to having another appointment. Appointment scheduled for 8/19 at 3:45pm with Dr.Silva. Agreeable to date and time.  Routing to provider for final review. Patient agreeable to disposition. Will close encounter.   Patient aware provider will review message and nurse will return call if any additional advice or change of disposition.

## 2015-05-10 NOTE — Telephone Encounter (Signed)
Patient is asking to see Dr.Silva for a recheck on her pessary. Patient is having some problems with her pessary. Last seen 04/05/15.

## 2015-05-14 ENCOUNTER — Ambulatory Visit (INDEPENDENT_AMBULATORY_CARE_PROVIDER_SITE_OTHER): Payer: 59 | Admitting: Obstetrics and Gynecology

## 2015-05-14 ENCOUNTER — Encounter: Payer: Self-pay | Admitting: Obstetrics and Gynecology

## 2015-05-14 VITALS — BP 130/70 | HR 80 | Ht 68.0 in | Wt 133.4 lb

## 2015-05-14 DIAGNOSIS — N760 Acute vaginitis: Secondary | ICD-10-CM

## 2015-05-14 DIAGNOSIS — N993 Prolapse of vaginal vault after hysterectomy: Secondary | ICD-10-CM | POA: Diagnosis not present

## 2015-05-14 DIAGNOSIS — T836XXA Infection and inflammatory reaction due to prosthetic device, implant and graft in genital tract, initial encounter: Secondary | ICD-10-CM | POA: Diagnosis not present

## 2015-05-14 DIAGNOSIS — N952 Postmenopausal atrophic vaginitis: Secondary | ICD-10-CM

## 2015-05-14 DIAGNOSIS — T8369XA Infection and inflammatory reaction due to other prosthetic device, implant and graft in genital tract, initial encounter: Principal | ICD-10-CM

## 2015-05-14 NOTE — Progress Notes (Signed)
Patient ID: Paula Johnson, female   DOB: [birth year], 59 y.o.   MRN: [id number] GYNECOLOGY  VISIT   HPI: 58 y.o.   Widowed  Caucasian  female   G2P2002 with No LMP recorded. Patient has had a hysterectomy.   here for pessary check.  Wearing every day until bleeding started.  Had some bleeding but this has resolved.   Incontinence dish and the ring with support do not stay in place behind the pubic symphysis.  The incontinence dish is uncomfortable.  The ring with support also protrudes but it is not uncomfortable on the urethral the way the incontinence dish is.   No painful urination or bowel movements.  GYNECOLOGIC HISTORY: No LMP recorded. Patient has had a hysterectomy. Contraception: Hysterectomy Menopausal hormone therapy: Estradiol/Progesterone cream Last mammogram: 01/2013 [facility] 4:Suspicious abnormality;US Rt.Breast revealed irregular bordered hypoechoic lesion at 4:00 position 3cm from nipple--US guided bx revealed fibrocystic changes in Rt.Br.   Last pap smear: 2015 Neg        OB History    Gravida Para Term Preterm AB TAB SAB Ectopic Multiple Living   2 2 2       2          Patient Active Problem List   Diagnosis Date Noted  . HYPOXEMIA 12/16/2007  . HYPERLIPIDEMIA 12/04/2007  . DYSPNEA 12/04/2007  . MALIGNANT NEOPLASM OF THYMUS 12/03/2007  . OBSTRUCTIVE SLEEP APNEA 12/03/2007  . RESTLESS LEGS SYNDROME 12/03/2007    Past Medical History  Diagnosis Date  . Cancer 1999    squamous cell carcinoma of thymus  . Prediabetes   . Fibroid     reason for Hysterectomy  . Urinary incontinence     Past Surgical History  Procedure Laterality Date  . Thymectomy  1999  . Abdominal hysterectomy  1997    TAH--ovaries remain--Dr. Newton Pigg    Current Outpatient Prescriptions  Medication Sig Dispense Refill  . co-enzyme Q-10 30 MG capsule Take 30 mg by mouth 3 (three) times daily.    . Estradiol-Estriol-Progesterone (BIEST/PROGESTERONE) CREA Place onto the skin.     Marland Kitchen UNABLE TO FIND Med Name: Provex CV Takes daily    . UNABLE TO FIND Med Name:  Recover A1 Takes daily    . UNABLE TO FIND Med Name: [place] UNABLE TO FIND Med Name: Replenex Extra Strength Takes daily    . UNABLE TO FIND Med Name: Vitality [place] Omega-3 Takes daily    . UNABLE TO FIND Med Name: Acuity [place] Takes daily    . UNABLE TO FIND Med Name: CardiOmega EPA Takes daily    . UNABLE TO FIND Med Name: NutraView Takes daily    . UNABLE TO FIND Med Name: CellWise Takes daily    . UNABLE TO FIND Med Name: GHR Complex (growth hormone releaser) Takes daily     No current facility-administered medications for this visit.     ALLERGIES: Ampicillin  Family History  Problem Relation Age of Onset  . COPD Father     dec age 33  . Diabetes Brother   . Other Brother     committed suicide age 32  . Hyperlipidemia Mother   . Diabetes Maternal Grandmother     Social History   Social History  . Marital Status: Widowed    Spouse Name: N/A  . Number of Children: N/A  . Years of Education: N/A   Occupational History  . Not on file.   Social History Main  Topics  . Smoking status: Never Smoker   . Smokeless tobacco: Never Used  . Alcohol Use: No  . Drug Use: No  . Sexual Activity:    Partners: Male    Birth Control/ Protection: Surgical     Comment: TAH--ovaries remain   Other Topics Concern  . Not on file   Social History Narrative    ROS:  Pertinent items are noted in HPI.  PHYSICAL EXAMINATION:    BP 130/70 mmHg  Pulse 80  Ht 5\' 8"  (1.727 m)  Wt 133 lb 6.4 oz (60.51 kg)  BMI 20.29 kg/m2    General appearance: alert, cooperative and appears stated age   Pelvic: External genitalia:  no lesions              Urethra:  normal appearing urethra with no masses, tenderness or lesions              Bartholins and Skenes: normal                 Vagina: normal appearing vagina with normal color and discharge, no lesions.  Mild erythema along the left  vaginal wall.  No ulceration or granulation tissue. Vault prolapse noted.              Cervix: absent     Bimanual Exam:  Uterus:  uterus absent              Adnexa: no mass, fullness, tenderness          Chaperone was present for exam.  ASSESSMENT  Vaginal vault prolapse.  Vaginal atrophy.  Some intolerance of tissues to pessary use.  Using [place] for mammogram.   PLAN  Counseled regarding vaginal atrophy and use of local vaginal estrogen.  This will improve the tolerance of the vaginal mucosa to the pessary.  Discussed the need to do mammogram prior to prescribing this medication.  I did discuss the potential risks of DVT, PE, MI, stroke, and breast cancer with estrogen use.  We reviewed that there are other pessaries with more holding power such as the Gelhorn, and patient understands this is more difficult for patients to manage.   ACOG handout on prolapse to patient.   An After Visit Summary was printed and given to the patient.  ___15___ minutes face to face time of which over 50% was spent in counseling.

## 2015-05-16 ENCOUNTER — Encounter: Payer: Self-pay | Admitting: Obstetrics and Gynecology

## 2015-05-28 ENCOUNTER — Other Ambulatory Visit: Payer: Self-pay

## 2015-05-28 DIAGNOSIS — Z1231 Encounter for screening mammogram for malignant neoplasm of breast: Secondary | ICD-10-CM

## 2015-06-23 ENCOUNTER — Ambulatory Visit
Admission: RE | Admit: 2015-06-23 | Discharge: 2015-06-23 | Disposition: A | Discharge status: Home / Self Care | Payer: 59 | Source: Ambulatory Visit

## 2015-06-23 DIAGNOSIS — Z1231 Encounter for screening mammogram for malignant neoplasm of breast: Secondary | ICD-10-CM

## 2015-06-25 ENCOUNTER — Telehealth: Payer: Self-pay | Admitting: Obstetrics and Gynecology

## 2015-06-25 NOTE — Telephone Encounter (Signed)
Patient says she had her mammogram done 06/23/2015 and Dr. Quincy Simmonds was going to prescribe some vaginal estrogen for her.  Patient is going out of town for 2 weeks tomorrow, needs this sent in if not she'll have to wait a couple weeks.  Best contact # [phone] (Work) [facility]acy: [facility]

## 2015-06-25 NOTE — Telephone Encounter (Signed)
Spoke with patient. Advised the final result from her mammogram performed on 06/23/2015 has not been released. Will need this result report for Dr.Silva to review prior to prescribing vaginal estrogen. Advised will continue to look for the result through out the day. If result comes through I will have Dr.Silva review them so that rx can be sent in if appropriate. Advised I will let her know towards the end of the work day if we have or have not received this report. Patient is agreeable.

## 2015-06-25 NOTE — Telephone Encounter (Signed)
Left detailed message for patient at number provided [phone]. Okay per ROI. Advised final result from mammogram has not return yet. Once we have received this it will be reviewed by Dr.Silva and rx will be prescribed if appropriate. Advised to return call with any further questions.  Routing to provider for final review. Patient agreeable to disposition. Will close encounter.

## 2015-07-15 ENCOUNTER — Other Ambulatory Visit: Payer: Self-pay | Admitting: Obstetrics and Gynecology

## 2015-07-15 ENCOUNTER — Telehealth: Payer: Self-pay | Admitting: Obstetrics and Gynecology

## 2015-07-15 MED ORDER — ESTRADIOL 0.1 MG/GM VA CREA: TOPICAL_CREAM | VAGINAL | Status: DC

## 2015-07-15 NOTE — Telephone Encounter (Signed)
I sent her Rx in to [facility] for Estrace vaginal cream.

## 2015-07-15 NOTE — Telephone Encounter (Signed)
Patient calling to check on status of her mammogram results from [facility]. She said, "Once those results are confirmed I'd like my estradiol called in please." Pharmacy on file is correct.

## 2015-07-15 NOTE — Telephone Encounter (Signed)
Patient had bilateral screening mammogram on 06/23/2015 at the [facility]. Results are available in EPIC. Patient is requesting review for start on Estradiol. Routing to Oval for review and advise.

## 2015-07-16 NOTE — Telephone Encounter (Signed)
Left message to call Paula Johnson at [phone]. 

## 2015-07-20 NOTE — Telephone Encounter (Signed)
Left message to call Kaitlyn at [phone]. 

## 2015-07-20 NOTE — Telephone Encounter (Signed)
Spoke with patient. Advised of message as seen below from Dr.Silva. Patient is agreeable.  Routing to provider for final review. Patient agreeable to disposition. Will close encounter.  

## 2015-08-26 DIAGNOSIS — C9000 Multiple myeloma not having achieved remission: Secondary | ICD-10-CM

## 2015-08-26 HISTORY — DX: Multiple myeloma not having achieved remission: C90.00

## 2015-09-03 ENCOUNTER — Telehealth: Payer: Self-pay | Admitting: Hematology

## 2015-09-03 NOTE — Telephone Encounter (Signed)
Pt aware of np appt. 09/29/15@10 :70 Referring  Dr. Moreen Fowler DX -Anemia

## 2015-09-13 ENCOUNTER — Ambulatory Visit: Payer: Self-pay | Admitting: Hematology

## 2015-09-26 HISTORY — PX: BONE MARROW TRANSPLANT: SHX200

## 2015-09-29 ENCOUNTER — Ambulatory Visit (HOSPITAL_BASED_OUTPATIENT_CLINIC_OR_DEPARTMENT_OTHER): Payer: BLUE CROSS/BLUE SHIELD | Admitting: Hematology

## 2015-09-29 ENCOUNTER — Encounter: Payer: Self-pay | Admitting: Hematology

## 2015-09-29 ENCOUNTER — Other Ambulatory Visit: Payer: Self-pay | Admitting: Family Medicine

## 2015-09-29 ENCOUNTER — Ambulatory Visit
Admission: RE | Admit: 2015-09-29 | Discharge: 2015-09-29 | Disposition: A | Discharge status: Home / Self Care | Payer: BLUE CROSS/BLUE SHIELD | Source: Ambulatory Visit | Attending: Family Medicine | Admitting: Family Medicine

## 2015-09-29 VITALS — BP 146/89 | HR 101 | Temp 97.7°F | Resp 18 | Ht 68.0 in | Wt 130.6 lb

## 2015-09-29 DIAGNOSIS — D649 Anemia, unspecified: Secondary | ICD-10-CM | POA: Diagnosis not present

## 2015-09-29 DIAGNOSIS — D892 Hypergammaglobulinemia, unspecified: Secondary | ICD-10-CM | POA: Insufficient documentation

## 2015-09-29 DIAGNOSIS — R0789 Other chest pain: Secondary | ICD-10-CM

## 2015-09-29 DIAGNOSIS — Z85230 Personal history of malignant carcinoid tumor of thymus: Secondary | ICD-10-CM

## 2015-09-29 DIAGNOSIS — E8809 Other disorders of plasma-protein metabolism, not elsewhere classified: Secondary | ICD-10-CM

## 2015-09-29 DIAGNOSIS — C9000 Multiple myeloma not having achieved remission: Secondary | ICD-10-CM | POA: Diagnosis not present

## 2015-09-29 NOTE — Progress Notes (Signed)
.    HEMATOLOGY/ONCOLOGY CONSULTATION NOTE  Date of Service: 09/29/2015  Patient Care Team: David Swayne, MD as PCP - General (Family Medicine)  CHIEF COMPLAINTS/PURPOSE OF CONSULTATION:  anemia  HISTORY OF PRESENTING ILLNESS:  Referring office. Paula Johnson is a wonderful 60 y.o. female who has been referred to us by Dr PCP@ for evaluation and management of "Anemia"  Patient has a history of thymic carcinoma diagnosed in 1999 treated with thymectomy and postoperative radiation, obstructive sleep apnea treated with an oral appliance, restless leg syndrome, prediabetes was apparently in her usual state of health until a few months ago. She noted some gradually increasing fatigue, frequent headaches and numbness in the feet over the last few months. Blood work done at work and with her primary care physician showed normal hemoglobin of 12.3 in 06/2014. Her hemoglobin dropped down to 11 on 05/04/2015 and then 9.9 on 07/13/2015 with normal MCV. No evidence of bleeding. She had a colonoscopy and EGD on 08/16/2015 which he reports was unrevealing.  Last available outside labs from 07/27/2015 showed a hemoglobin of 10.1 with normal MCV. Her total protein and gammaglobulin fraction were noted to progressively increase since last year based on outside available labs. She notes no fevers or chills. No requirement for blood transfusions. No  Insect bites. No onset travel.  She notes that she was yawning/stretching and felt like one of her ribs popped. She subsequently had a thickened anterior chest wall pain and had an extended did not show any issues. Would still continues to have chest pain with palpation. No cough.  Increasing headaches over the last few months. No focal neurological deficits. Normal aura. Difficult to localize.  No significant weight loss. Good appetite. Overall feels well. Has been working full time. Has been on several different nutritional supplements.  Her labs showed normal  calcium levels. No overt signs of kidney failure.   No other specific focal symptoms. MEDICAL HISTORY:  Past Medical History  Diagnosis Date  . Cancer (HCC) 1999    squamous cell carcinoma of thymus  . Prediabetes   . Fibroid     reason for Hysterectomy  . Urinary incontinence   Vitamin D deficiency Hyperlipidemia-diet-controlled History of obstructive sleep apnea followed by Dr. Clance -currently using an oral appliance at night. History of restless leg syndrome Follows with Dr. Smith in cardiology for palpitations. Has had a previous workup with stress echo and cardiac catheterization in June 2009 History of thymic carcinoma in 1999 status post thymectomy and radiation.  SURGICAL HISTORY: Past Surgical History  Procedure Laterality Date  . Thymectomy  1999  . Abdominal hysterectomy  1997    TAH--ovaries remain--Dr. Thomas Henley    SOCIAL HISTORY: Social History   Social History  . Marital Status: Widowed    Spouse Name: N/A  . Number of Children: N/A  . Years of Education: N/A   Occupational History  . Not on file.   Social History Main Topics  . Smoking status: Never Smoker   . Smokeless tobacco: Never Used  . Alcohol Use: No  . Drug Use: No  . Sexual Activity:    Partners: Male    Birth Control/ Protection: Surgical     Comment: TAH--ovaries remain   Other Topics Concern  . 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  FAMILY HISTORY: Family History  Problem Relation Age of Onset  . COPD Father     dec age 85  .   Diabetes Brother   . Other Brother     committed suicide age 47  . Hyperlipidemia Mother   . Diabetes Maternal Grandmother     ALLERGIES:  is allergic to ampicillin.  MEDICATIONS:  Current Outpatient Prescriptions  Medication Sig Dispense Refill  . co-enzyme Q-10 30 MG capsule Take 30 mg by mouth 3 (three) times daily.    Marland Kitchen estradiol (ESTRACE) 0.1 MG/GM vaginal cream Use 1/2 g vaginally every  night for the first 2 weeks, then use 1/2 g vaginally two or three times per week. 30 g 3  . meloxicam (MOBIC) 15 MG tablet Take 15 mg by mouth daily.    Marland Kitchen UNABLE TO FIND Med Name: Provex CV Takes daily    . UNABLE TO FIND Med Name:  Recover A1 Takes daily    . UNABLE TO FIND Med Name: [place] UNABLE TO FIND Med Name: Replenex Extra Strength Takes daily    . UNABLE TO FIND Med Name: Vitality [place] Omega-3 Takes daily    . UNABLE TO FIND Med Name: Acuity [place] Takes daily    . UNABLE TO FIND Med Name: CardiOmega EPA Takes daily    . UNABLE TO FIND Med Name: NutraView Takes daily    . UNABLE TO FIND Med Name: CellWise Takes daily    . UNABLE TO FIND Med Name: GHR Complex (growth hormone releaser) Takes daily     No current facility-administered medications for this visit.   Facility-Administered Medications Ordered in Other Visits  Medication Dose Route Frequency Provider Last Rate Last Dose  . fentaNYL (SUBLIMAZE) 100 MCG/2ML injection           . midazolam (VERSED) 2 MG/2ML injection             REVIEW OF SYSTEMS:    10 Point review of Systems was done is negative except as noted above.  PHYSICAL EXAMINATION: ECOG PERFORMANCE STATUS: 1 - Symptomatic but completely ambulatory  . Filed Vitals:   09/29/15 1118  BP: 146/89  Pulse: 101  Temp: 97.7 F (36.5 C)  Resp: 18   Filed Weights   09/29/15 1118  Weight: 130 lb 9.6 oz (59.24 kg)   .Body mass index is 19.86 kg/(m^2).  GENERAL:alert, in no acute distress and comfortable SKIN: skin color, texture, turgor are normal, no rashes or significant lesions EYES: normal, conjunctiva are pink and non-injected, sclera clear OROPHARYNX:no exudate, no erythema and lips, buccal mucosa, and tongue normal  NECK: supple, no JVD, thyroid normal size, non-tender, without nodularity LYMPH:  no palpable lymphadenopathy in the cervical, axillary or inguinal LUNGS:tenderness to palpation over her right costochondral  junction. clear to auscultation with normal respiratory effort HEART: regular rate & rhythm,  no murmurs and no lower extremity edema ABDOMEN: abdomen soft, non-tender, normoactive bowel sounds  Musculoskeletal: no cyanosis of digits and no clubbing no focal tenderness to palpation over spine. PSYCH: alert & oriented x 3 with fluent speech NEURO: no focal motor/sensory deficits  LABORATORY DATA:  I have reviewed the data as listed                      Peripheral Blood Smear 09/30/2015:  Significant rouleaux formation. No increased schistocytes. Adequate platelets. No platelet clumping. No increased blasts.  RADIOGRAPHIC STUDIES: I have personally reviewed the radiological images as listed and agreed with the findings in the report. Dg Chest 2 View  09/29/2015  CLINICAL DATA:  Cough.  Heard pop in  center of chest.  Pain. EXAM: CHEST  2 VIEW COMPARISON:  12/25/2011 FINDINGS: Prior median sternotomy. Heart and mediastinal contours are within normal limits. No focal opacities or effusions. No acute bony abnormality. IMPRESSION: No active cardiopulmonary disease. Electronically Signed   By: Kevin  Dover M.D.   On: 09/29/2015 16:43   Nm Pet Image Initial (pi) Whole Body  10/12/2015  CLINICAL DATA:  Initial treatment strategy for concern for multiple myeloma. Staging. History of malignant neoplasm of thymus. EXAM: NUCLEAR MEDICINE PET WHOLE BODY TECHNIQUE: 7.2 mCi F-18 FDG was injected intravenously. Full-ring PET imaging was performed from the vertex to the feet after the radiotracer. CT data was obtained and used for attenuation correction and anatomic localization. FASTING BLOOD GLUCOSE:  Value:  93 mg/dl COMPARISON:  Chest radiograph 09/29/2015. FINDINGS: HEAD/ NECK No areas of abnormal hypermetabolism. CHEST No extraosseous hypermetabolism identified. ABDOMEN/PELVIS No extraosseous hypermetabolism identified. SKELETON Hypermetabolism which corresponds to subtle fracture involving  the lateral left fourth rib. This measures a S.U.V. max of 5.5, including on image 94/ series 4. No well-defined lytic lesion identified. Hypermetabolism within the sternum is favored to be postsurgical, status post median sternotomy. Equivocal heterogeneous activity involving the pelvis and thoracolumbar spine. There is paucity of activity involving the upper thoracic spine, favored to be due to prior radiation. CT IMAGES PERFORMED FOR ATTENUATION CORRECTION Innumerable lytic lesions throughout the calvarium, including on image 10/series 4. No cervical adenopathy. Left greater than right carotid atherosclerosis. Mild cardiomegaly. Vague increased density in the left upper lobe is favored to represent subsegmental atelectasis. Punctate upper pole left renal collecting system calculus. Hysterectomy. Pessary in place. Heterogeneous marrow density throughout. Innumerable small lytic lesions including within the iliac bones bilaterally. Right-sided vertebral body lesion at approximately T7 is relatively ill-defined, including on image 89/ series 4. Approximately 13 mm. IMPRESSION: 1. Extensive heterogeneous marrow density with innumerable lytic lesions, especially in the calvarium. Findings are consistent with multiple myeloma. Disease is relatively PET occutl. A left lateral fourth rib fracture is hypermetabolic and could be pathologic. Heterogeneous activity within the pelvis and spine is indeterminate. 2.   No evidence of extraosseous myeloma. Electronically Signed   By: Kyle  Talbot M.D.   On: 10/12/2015 16:02   Ct Biopsy  10/13/2015  CLINICAL DATA:  Anemia, plasma cell dyscrasia, concern for myeloma EXAM: CT GUIDED RIGHT ILIAC BONE MARROW ASPIRATION AND CORE BIOPSY Date:  1/18/20171/18/2017 9:34 am Radiologist:  M. Trevor Shick, MD Guidance:  CT FLUOROSCOPY TIME:  NONE. MEDICATIONS AND MEDICAL HISTORY: 2 mg Versed, 75 mcg fentanyl ANESTHESIA/SEDATION: 10 minutes, the patient's level of consciousness and  physiological status was monitored by radiology nursing. CONTRAST:  None. COMPLICATIONS: None immediate PROCEDURE: Informed consent was obtained from the patient following explanation of the procedure, risks, benefits and alternatives. The patient understands, agrees and consents for the procedure. All questions were addressed. A time out was performed. The patient was positioned prone and noncontrast localization CT was performed of the pelvis to demonstrate the iliac marrow spaces. Maximal barrier sterile technique utilized including caps, mask, sterile gowns, sterile gloves, large sterile drape, hand hygiene, and betadine prep. Under sterile conditions and local anesthesia, an 11 gauge coaxial bone biopsy needle was advanced into the right iliac marrow space. Needle position was confirmed with CT imaging. Initially, bone marrow aspiration was performed. Next, the 11 gauge outer cannula was utilized to obtain a right iliac bone marrow core biopsy. Needle was removed. Hemostasis was obtained with compression. The patient tolerated the procedure well. Samples   were prepared with the cytotechnologist. No immediate complications. IMPRESSION: CT guided right iliac bone marrow aspiration and core biopsy. Electronically Signed   By: Jerilynn Mages.  Shick M.D.   On: 10/13/2015 10:00    ASSESSMENT & PLAN:   61 year old female with  #1 worsening normocytic anemia likely due to IgA lambda multiple myeloma. #2 IgA lambda multiple myeloma. Patient has an M protein spike of 4.9 g/dL. Creatinine within normal limits. No hypercalcemia. PET CT scan shows innumerable small lytic lesions.elevated sedimentation rate and B2 microglobulin as noted above.  #3 numbness in feet could be related to paraproteinemia associated neuropathy. #4 history of thymic carcinoma status post resection and postoperative radiation in 1999. Thoracic spine marrow paucity likely due to previous radiation. #5 prediabetes #6 mild sleep apnea treated with oral  prosthesis Plan -Patient is to have her bone marrow biopsy today on 10/13/2015. We will follow-up on the results of these. -I discussed findings with her previous oncologist Dr. Sonny Dandy as per patient's request. -We will likely plan to treat her with 3 drug regimen likely VRd given normal kidney function. -Plan to consolidate with an Auto HSCT is patient has decent response. -Patient is been set up to return to care to discuss bone marrow biopsy results and define final treatment plan on 10/20/2015 with [name].   All of the patients questions were answered to her apparent satisfaction. The patient knows to call the clinic with any problems, questions or concerns.  I spent 60 minutes counseling the patient face to face. The total time spent in the appointment was 70 minutes and more than 50% was on counseling and direct patient cares.    Sullivan Lone MD [place] AAHIVMS [facility] Hematology/Oncology Physician [facility]  (Office):       [phone] (Work cell):  [phone] (Fax):           [phone]  09/29/2015 11:27 AM

## 2015-10-04 ENCOUNTER — Telehealth: Payer: Self-pay | Admitting: *Deleted

## 2015-10-04 NOTE — Telephone Encounter (Signed)
[facility] called results of 09-30-2015 Total Protein Electrophoresis = 10.3 high alert.  Will notify [name].

## 2015-10-05 ENCOUNTER — Other Ambulatory Visit: Payer: Self-pay | Admitting: Orthopedic Surgery

## 2015-10-05 ENCOUNTER — Telehealth: Payer: Self-pay | Admitting: Hematology

## 2015-10-05 DIAGNOSIS — E8809 Other disorders of plasma-protein metabolism, not elsewhere classified: Secondary | ICD-10-CM | POA: Insufficient documentation

## 2015-10-05 DIAGNOSIS — R109 Unspecified abdominal pain: Secondary | ICD-10-CM

## 2015-10-05 NOTE — Telephone Encounter (Signed)
Lt mess for pt regarding future appt 10/20/15 at 9:30am and informed that she will be contacted regarding a PET and CT Biopsy

## 2015-10-07 ENCOUNTER — Other Ambulatory Visit: Payer: Self-pay

## 2015-10-08 ENCOUNTER — Encounter: Payer: Self-pay | Admitting: Hematology

## 2015-10-08 ENCOUNTER — Other Ambulatory Visit: Payer: Self-pay | Admitting: *Deleted

## 2015-10-08 NOTE — Progress Notes (Signed)
Called Paula Johnson to confirm appt. on 10/19/14 with [name]. Patient requested to have her lab work done by [facility]. Forwarded to Dr. Grier Mitts nurse to call Paula Johnson regarding lab request.

## 2015-10-12 ENCOUNTER — Other Ambulatory Visit: Payer: Self-pay | Admitting: Physician Assistant

## 2015-10-12 ENCOUNTER — Encounter (HOSPITAL_COMMUNITY)
Admission: RE | Admit: 2015-10-12 | Discharge: 2015-10-12 | Disposition: A | Discharge status: Home / Self Care | Payer: BLUE CROSS/BLUE SHIELD | Source: Ambulatory Visit | Attending: Hematology | Admitting: Hematology

## 2015-10-12 ENCOUNTER — Other Ambulatory Visit: Payer: Self-pay | Admitting: Radiology

## 2015-10-12 DIAGNOSIS — E8809 Other disorders of plasma-protein metabolism, not elsewhere classified: Secondary | ICD-10-CM | POA: Diagnosis present

## 2015-10-12 LAB — GLUCOSE, CAPILLARY: GLUCOSE-CAPILLARY: 93 mg/dL (ref 65–99)

## 2015-10-12 MED ORDER — FLUDEOXYGLUCOSE F - 18 (FDG) INJECTION
7.2000 | Freq: Once | INTRAVENOUS | Status: AC | PRN
  Administered 2015-10-12: 7.2 via INTRAVENOUS

## 2015-10-13 ENCOUNTER — Other Ambulatory Visit: Payer: Self-pay | Admitting: Hematology

## 2015-10-13 ENCOUNTER — Ambulatory Visit (HOSPITAL_COMMUNITY)
Admission: RE | Admit: 2015-10-13 | Discharge: 2015-10-13 | Disposition: A | Discharge status: Home / Self Care | Payer: BLUE CROSS/BLUE SHIELD | Source: Ambulatory Visit | Attending: Hematology | Admitting: Hematology

## 2015-10-13 ENCOUNTER — Encounter (HOSPITAL_COMMUNITY): Payer: Self-pay

## 2015-10-13 DIAGNOSIS — C801 Malignant (primary) neoplasm, unspecified: Secondary | ICD-10-CM | POA: Diagnosis not present

## 2015-10-13 DIAGNOSIS — D649 Anemia, unspecified: Secondary | ICD-10-CM | POA: Diagnosis present

## 2015-10-13 DIAGNOSIS — Z01812 Encounter for preprocedural laboratory examination: Secondary | ICD-10-CM | POA: Diagnosis present

## 2015-10-13 DIAGNOSIS — Z8249 Family history of ischemic heart disease and other diseases of the circulatory system: Secondary | ICD-10-CM | POA: Diagnosis not present

## 2015-10-13 DIAGNOSIS — Z833 Family history of diabetes mellitus: Secondary | ICD-10-CM | POA: Insufficient documentation

## 2015-10-13 DIAGNOSIS — E8809 Other disorders of plasma-protein metabolism, not elsewhere classified: Secondary | ICD-10-CM

## 2015-10-13 DIAGNOSIS — R7303 Prediabetes: Secondary | ICD-10-CM | POA: Diagnosis not present

## 2015-10-13 DIAGNOSIS — Z85238 Personal history of other malignant neoplasm of thymus: Secondary | ICD-10-CM | POA: Insufficient documentation

## 2015-10-13 HISTORY — DX: Other specified postprocedural states: R11.2

## 2015-10-13 HISTORY — DX: Other specified postprocedural states: Z98.890

## 2015-10-13 LAB — BONE MARROW EXAM

## 2015-10-13 LAB — CBC
HEMATOCRIT: 27.1 % — AB (ref 36.0–46.0)
HEMOGLOBIN: 8.8 g/dL — AB (ref 12.0–15.0)
MCH: 30.8 pg (ref 26.0–34.0)
MCHC: 32.5 g/dL (ref 30.0–36.0)
MCV: 94.8 fL (ref 78.0–100.0)
Platelets: 335 10*3/uL (ref 150–400)
RBC: 2.86 MIL/uL — ABNORMAL LOW (ref 3.87–5.11)
RDW: 16.9 % — AB (ref 11.5–15.5)
WBC: 5.2 10*3/uL (ref 4.0–10.5)

## 2015-10-13 LAB — PROTIME-INR
INR: 1.13 (ref 0.00–1.49)
Prothrombin Time: 14.7 seconds (ref 11.6–15.2)

## 2015-10-13 LAB — APTT: aPTT: 30 seconds (ref 24–37)

## 2015-10-13 MED ORDER — FENTANYL CITRATE (PF) 100 MCG/2ML IJ SOLN
INTRAMUSCULAR | Status: AC
  Filled 2015-10-13: qty 2

## 2015-10-13 MED ORDER — SODIUM CHLORIDE 0.9 % IV SOLN
Freq: Once | INTRAVENOUS | Status: AC
  Administered 2015-10-13: 07:00:00 via INTRAVENOUS

## 2015-10-13 MED ORDER — MIDAZOLAM HCL 2 MG/2ML IJ SOLN
INTRAMUSCULAR | Status: AC | PRN
  Administered 2015-10-13 (×2): 0.5 mg via INTRAVENOUS
  Administered 2015-10-13: 1 mg via INTRAVENOUS

## 2015-10-13 MED ORDER — MIDAZOLAM HCL 2 MG/2ML IJ SOLN
INTRAMUSCULAR | Status: AC
  Filled 2015-10-13: qty 4

## 2015-10-13 MED ORDER — FENTANYL CITRATE (PF) 100 MCG/2ML IJ SOLN
INTRAMUSCULAR | Status: AC | PRN
  Administered 2015-10-13: 50 ug via INTRAVENOUS
  Administered 2015-10-13: 25 ug via INTRAVENOUS

## 2015-10-13 NOTE — H&P (Signed)
Chief Complaint: Patient was seen in consultation today for CT-guided bone marrow biopsy  Referring Physician(s): Kale,Gautam Kishore  History of Present Illness: Paula Johnson is a 60 y.o. female with history of squamous cell carcinoma of the thymus in 1999. She now presents with an abnormal serum protein electrophoresis and bony lytic lesions on recent PET scan. She is scheduled today for CT-guided bone marrow biopsy for further evaluation to rule out multiple myeloma.  Past Medical History  Diagnosis Date  . Cancer ([place]) 1999    squamous cell carcinoma of thymus  . Prediabetes   . Fibroid     reason for Hysterectomy  . Urinary incontinence   . PONV (postoperative nausea and vomiting)     thinks morphine caused nausea  . Pain aggravated by activities of daily living     states she pulled something at right sternum in dec 2016    Past Surgical History  Procedure Laterality Date  . Thymectomy  1999  . Abdominal hysterectomy  1997    TAH--ovaries remain--Dr. Newton Pigg    Allergies: Ampicillin  Medications: Prior to Admission medications   Medication Sig Start Date End Date Taking? Authorizing Provider  co-enzyme Q-10 30 MG capsule Take 30 mg by mouth 3 (three) times daily.    Historical Provider, MD  estradiol (ESTRACE) 0.1 MG/GM vaginal cream Use 1/2 g vaginally every night for the first 2 weeks, then use 1/2 g vaginally two or three times per week. 07/15/15   [place], MD  meloxicam (MOBIC) 15 MG tablet Take 15 mg by mouth daily.    Historical Provider, MD  UNABLE TO FIND Med Name: Provex CV Takes daily    Historical Provider, MD  UNABLE TO FIND Med Name:  Recover A1 Takes daily    Historical Provider, MD  UNABLE TO FIND Med Name: [place] Provider, MD  UNABLE TO FIND Med Name: Replenex Extra Strength Takes daily    Historical Provider, MD  UNABLE TO FIND Med Name: Vitality [place] Omega-3 Takes daily    Historical  Provider, MD  [place] Name: Acuity [place] Takes daily    Historical Provider, MD  UNABLE TO FIND Med Name: CardiOmega EPA Takes daily    Historical Provider, MD  UNABLE TO FIND Med Name: Charlynn Grimes daily    Historical Provider, MD  UNABLE TO FIND Med Name: CellWise Takes daily    Historical Provider, MD  UNABLE TO FIND Med Name: GHR Complex (growth hormone releaser) Takes daily    Historical Provider, MD     Family History  Problem Relation Age of Onset  . COPD Father     dec age 15  . Diabetes Brother   . Other Brother     committed suicide age 62  . Hyperlipidemia Mother   . Diabetes Maternal Grandmother     Social History   Social History  . Marital Status: Widowed    Spouse Name: N/A  . Number of Children: N/A  . Years of Education: N/A   Social History Main Topics  . Smoking status: Never Smoker   . Smokeless tobacco: Never Used  . Alcohol Use: No  . Drug Use: No  . Sexual Activity:    Partners: Male    Birth Control/ Protection: Surgical     Comment: TAH--ovaries remain   Other Topics Concern  . None   Social History Narrative  Review of Systems  Constitutional: Negative for fever and chills.  Respiratory: Negative for cough and shortness of breath.   Cardiovascular: Positive for chest pain.  Gastrointestinal: Negative for nausea, vomiting, abdominal pain and blood in stool.  Genitourinary: Negative for dysuria and hematuria.  Musculoskeletal: Negative for back pain.  Neurological: Negative for headaches.    Vital Signs: Blood pressure 144/97, temperature 98.6, heart rate 80, respirations 18, oxygen saturation is 100% room air   Physical Exam  Constitutional: She is oriented to person, place, and time. She appears well-developed and well-nourished.  Cardiovascular: Normal rate and regular rhythm.   Pulmonary/Chest: Effort normal.  Few right basilar crackles, left lung clear  Abdominal: Soft. Bowel sounds are normal. There is no  tenderness.  Musculoskeletal: Normal range of motion. She exhibits no edema.  Neurological: She is alert and oriented to person, place, and time.    Mallampati Score:     Imaging: Dg Chest 2 View  09/29/2015  CLINICAL DATA:  Cough.  Heard pop in center of chest.  Pain. EXAM: CHEST  2 VIEW COMPARISON:  12/25/2011 FINDINGS: Prior median sternotomy. Heart and mediastinal contours are within normal limits. No focal opacities or effusions. No acute bony abnormality. IMPRESSION: No active cardiopulmonary disease. Electronically Signed   By: Rolm Baptise M.D.   On: 09/29/2015 16:43   Nm Pet Image Initial (pi) Whole Body  10/12/2015  CLINICAL DATA:  Initial treatment strategy for concern for multiple myeloma. Staging. History of malignant neoplasm of thymus. EXAM: NUCLEAR MEDICINE PET WHOLE BODY TECHNIQUE: 7.2 mCi F-18 FDG was injected intravenously. Full-ring PET imaging was performed from the vertex to the feet after the radiotracer. CT data was obtained and used for attenuation correction and anatomic localization. FASTING BLOOD GLUCOSE:  Value:  93 mg/dl COMPARISON:  Chest radiograph 09/29/2015. FINDINGS: HEAD/ NECK No areas of abnormal hypermetabolism. CHEST No extraosseous hypermetabolism identified. ABDOMEN/PELVIS No extraosseous hypermetabolism identified. SKELETON Hypermetabolism which corresponds to subtle fracture involving the lateral left fourth rib. This measures a S.U.V. max of 5.5, including on image 94/ series 4. No well-defined lytic lesion identified. Hypermetabolism within the sternum is favored to be postsurgical, status post median sternotomy. Equivocal heterogeneous activity involving the pelvis and thoracolumbar spine. There is paucity of activity involving the upper thoracic spine, favored to be due to prior radiation. CT IMAGES PERFORMED FOR ATTENUATION CORRECTION Innumerable lytic lesions throughout the calvarium, including on image 10/series 4. No cervical adenopathy. Left greater  than right carotid atherosclerosis. Mild cardiomegaly. Vague increased density in the left upper lobe is favored to represent subsegmental atelectasis. Punctate upper pole left renal collecting system calculus. Hysterectomy. Pessary in place. Heterogeneous marrow density throughout. Innumerable small lytic lesions including within the iliac bones bilaterally. Right-sided vertebral body lesion at approximately T7 is relatively ill-defined, including on image 89/ series 4. Approximately 13 mm. IMPRESSION: 1. Extensive heterogeneous marrow density with innumerable lytic lesions, especially in the calvarium. Findings are consistent with multiple myeloma. Disease is relatively PET occutl. A left lateral fourth rib fracture is hypermetabolic and could be pathologic. Heterogeneous activity within the pelvis and spine is indeterminate. 2.   No evidence of extraosseous myeloma. Electronically Signed   By: Abigail Miyamoto M.D.   On: 10/12/2015 16:02    Labs:  CBC:  Recent Labs  10/13/15 0715  WBC 5.2  HGB 8.8*  HCT 27.1*  PLT 335    COAGS:  Recent Labs  10/13/15 0715  INR 1.13  APTT 30    BMP: No  results for input(s): NA, K, CL, CO2, GLUCOSE, BUN, CALCIUM, CREATININE, GFRNONAA, GFRAA in the last 8760 hours.  Invalid input(s): CMP  LIVER FUNCTION TESTS: No results for input(s): BILITOT, AST, ALT, ALKPHOS, PROT, ALBUMIN in the last 8760 hours.  TUMOR MARKERS: No results for input(s): AFPTM, CEA, CA199, CHROMGRNA in the last 8760 hours.  Assessment and Plan: 60 y.o. female with history of squamous cell carcinoma of the thymus in 1999. She now presents with an abnormal serum protein electrophoresis and bony lytic lesions on recent PET scan. She is scheduled today for CT-guided bone marrow biopsy for further evaluation to rule out multiple myeloma.Risks and benefits discussed with the patient /husband including, but not limited to bleeding, infection, damage to adjacent structures or low yield  requiring additional tests.All of the patient's questions were answered, patient is agreeable to proceed.Consent signed and in chart.      Thank you for this interesting consult.  I greatly enjoyed meeting KALEISHA BHARGAVA and look forward to participating in their care.  A copy of this report was sent to the requesting provider on this date.  Electronically Signed: D. Rowe Robert 10/13/2015, 8:22 AM   I spent a total of   15 minutes in face to face in clinical consultation, greater than 50% of which was counseling/coordinating care for CT-guided bone marrow biopsy

## 2015-10-13 NOTE — Procedures (Signed)
Successful RT ILIAC BM ASP AND CORE BX No comp Stable Path pending Full report in PACS EBL 0

## 2015-10-13 NOTE — Discharge Instructions (Signed)
Moderate Conscious Sedation, Adult Sedation is the use of medicines to promote relaxation and relieve discomfort and anxiety. Moderate conscious sedation is a type of sedation. Under moderate conscious sedation you are less alert than normal but are still able to respond to instructions or stimulation. Moderate conscious sedation is used during short medical and dental procedures. It is milder than deep sedation or general anesthesia and allows you to return to your regular activities sooner. LET [facility] CARE PROVIDER KNOW ABOUT:   Any allergies you have.  All medicines you are taking, including vitamins, herbs, eye drops, creams, and over-the-counter medicines.  Use of steroids (by mouth or creams).  Previous problems you or members of your family have had with the use of anesthetics.  Any blood disorders you have.  Previous surgeries you have had.  Medical conditions you have.  Possibility of pregnancy, if this applies.  Use of cigarettes, alcohol, or illegal drugs. RISKS AND COMPLICATIONS Generally, this is a safe procedure. However, as with any procedure, problems can occur. Possible problems include:  Oversedation.  Trouble breathing on your own. You may need to have a breathing tube until you are awake and breathing on your own.  Allergic reaction to any of the medicines used for the procedure. BEFORE THE PROCEDURE  You may have blood tests done. These tests can help show how well your kidneys and liver are working. They can also show how well your blood clots.  A physical exam will be done.  Only take medicines as directed by your health care provider. You may need to stop taking medicines (such as blood thinners, aspirin, or nonsteroidal anti-inflammatory drugs) before the procedure.   Do not eat or drink at least 6 hours before the procedure or as directed by your health care provider.  Arrange for a responsible adult, family member, or friend to take you home  after the procedure. He or she should stay with you for at least 24 hours after the procedure, until the medicine has worn off. PROCEDURE   An intravenous (IV) catheter will be inserted into one of your veins. Medicine will be able to flow directly into your body through this catheter. You may be given medicine through this tube to help prevent pain and help you relax.  The medical or dental procedure will be done. AFTER THE PROCEDURE  You will stay in a recovery area until the medicine has worn off. Your blood pressure and pulse will be checked.   Depending on the procedure you had, you may be allowed to go home when you can tolerate liquids and your pain is under control.   This information is not intended to replace advice given to you by your health care provider. Make sure you discuss any questions you have with your health care provider.   Document Released: 06/06/2001 Document Revised: 10/02/2014 Document Reviewed: 05/19/2013 Elsevier Interactive Patient Education 2016 [employer]  Moderate Conscious Sedation, Adult, Care After Refer to this sheet in the next few weeks. These instructions provide you with information on caring for yourself after your procedure. Your health care provider may also give you more specific instructions. Your treatment has been planned according to current medical practices, but problems sometimes occur. Call your health care provider if you have any problems or questions after your procedure. WHAT TO EXPECT AFTER THE PROCEDURE  After your procedure:  You may feel sleepy, clumsy, and have poor balance for several hours.  Vomiting may occur if you eat too  soon after the procedure. HOME CARE INSTRUCTIONS  Do not participate in any activities where you could become injured for at least 24 hours. Do not:  Drive.  Swim.  Ride a bicycle.  Operate heavy machinery.  Cook.  Use power tools.  Climb ladders.  Work from a high place.  Do not  make important decisions or sign legal documents until you are improved.  If you vomit, drink water, juice, or soup when you can drink without vomiting. Make sure you have little or no nausea before eating solid foods.  Only take over-the-counter or prescription medicines for pain, discomfort, or fever as directed by your health care provider.  Make sure you and your family fully understand everything about the medicines given to you, including what side effects may occur.  You should not drink alcohol, take sleeping pills, or take medicines that cause drowsiness for at least 24 hours.  If you smoke, do not smoke without supervision.  If you are feeling better, you may resume normal activities 24 hours after you were sedated.  Keep all appointments with your health care provider. SEEK MEDICAL CARE IF:  Your skin is pale or bluish in color.  You continue to feel nauseous or vomit.  Your pain is getting worse and is not helped by medicine.  You have bleeding or swelling.  You are still sleepy or feeling clumsy after 24 hours. SEEK IMMEDIATE MEDICAL CARE IF:  You develop a rash.  You have difficulty breathing.  You develop any type of allergic problem.  You have a fever. MAKE SURE YOU:  Understand these instructions.  Will watch your condition.  Will get help right away if you are not doing well or get worse.   This information is not intended to replace advice given to you by your health care provider. Make sure you discuss any questions you have with your health care provider.   Document Released: 07/02/2013 Document Revised: 10/02/2014 Document Reviewed: 07/02/2013 Elsevier Interactive Patient Education 2016 [place].  Bone Marrow Aspiration and Bone Marrow Biopsy, Care After Refer to this sheet in the next few weeks. These instructions provide you with information about caring for yourself after your procedure. Your health care provider may also give you more  specific instructions. Your treatment has been planned according to current medical practices, but problems sometimes occur. Call your health care provider if you have any problems or questions after your procedure. WHAT TO EXPECT AFTER THE PROCEDURE After your procedure, it is common to have:  Soreness or tenderness around the puncture site.  Bruising. HOME CARE INSTRUCTIONS  Take medicines only as directed by your health care provider.  Follow your health care provider's instructions about:  Puncture site care.  Bandage (dressing) changes and removal.  Bathe and shower as directed by your health care provider.  Check your puncture site every day for signs of infection. Watch for:  Redness, swelling, or pain.  Fluid, blood, or pus.  Return to your normal activities as directed by your health care provider.  Keep all follow-up visits as directed by your health care provider. This is important. SEEK MEDICAL CARE IF:  You have a fever.  You have uncontrollable bleeding.  You have redness, swelling, or pain at the site of your puncture.  You have fluid, blood, or pus coming from your puncture site.   This information is not intended to replace advice given to you by your health care provider. Make sure you discuss any questions you  have with your health care provider.   Document Released: 03/31/2005 Document Revised: 01/26/2015 Document Reviewed: 09/02/2014 Elsevier Interactive Patient Education 2016 [place]. Bone Marrow Aspiration and Bone Marrow Biopsy Bone marrow aspiration and bone marrow biopsy are procedures that are done to diagnose blood disorders. You may also have one of these procedures to help diagnose infections or some types of cancer. Bone marrow is the soft tissue that is inside your bones. Blood cells are produced in bone marrow. For bone marrow aspiration, a sample of tissue in liquid form is removed from inside your bone. For a bone marrow biopsy, a  small core of bone marrow tissue is removed. Then these samples are examined under a microscope or tested in a lab. You may need these procedures if you have an abnormal complete blood count (CBC). The aspiration or biopsy sample is usually taken from the top of your hip bone. Sometimes, an aspiration sample is taken from your chest bone (sternum). LET [facility] CARE PROVIDER KNOW ABOUT:  Any allergies you have.  All medicines you are taking, including vitamins, herbs, eye drops, creams, and over-the-counter medicines.  Previous problems you or members of your family have had with the use of anesthetics.  Any blood disorders you have.  Previous surgeries you have had.  Any medical conditions you may have.  Whether you are pregnant or you think that you may be pregnant. RISKS AND COMPLICATIONS Generally, this is a safe procedure. However, problems may occur, including:  Infection.  Bleeding. BEFORE THE PROCEDURE  Ask your health care provider about:  Changing or stopping your regular medicines. This is especially important if you are taking diabetes medicines or blood thinners.  Taking medicines such as aspirin and ibuprofen. These medicines can thin your blood. Do not take these medicines before your procedure if your health care provider instructs you not to.  Plan to have someone take you home after the procedure.  If you go home right after the procedure, plan to have someone with you for 24 hours. PROCEDURE   An IV tube may be inserted into one of your veins.  The injection site will be cleaned with a germ-killing solution (antiseptic).  You will be given one or more of the following:  A medicine that helps you relax (sedative).  A medicine that numbs the area (local anesthetic).  The bone marrow sample will be removed as follows:  For an aspiration, a hollow needle will be inserted through your skin and into your bone. Bone marrow fluid will be drawn up into a  syringe.  For a biopsy, your health care provider will use a hollow needle to remove a core of tissue from your bone marrow.  The needle will be removed.  A bandage (dressing) will be placed over the insertion site and taped in place. The procedure may vary among health care providers and hospitals. AFTER THE PROCEDURE  Your blood pressure, heart rate, breathing rate, and blood oxygen level will be monitored often until the medicines you were given have worn off.  Return to your normal activities as directed by your health care provider.   This information is not intended to replace advice given to you by your health care provider. Make sure you discuss any questions you have with your health care provider.   Document Released: 09/14/2004 Document Revised: 01/26/2015 Document Reviewed: 09/02/2014 Elsevier Interactive Patient Education [employer].

## 2015-10-19 ENCOUNTER — Other Ambulatory Visit: Payer: Self-pay | Admitting: Hematology

## 2015-10-19 DIAGNOSIS — C9000 Multiple myeloma not having achieved remission: Secondary | ICD-10-CM

## 2015-10-19 HISTORY — DX: Multiple myeloma not having achieved remission: C90.00

## 2015-10-19 MED ORDER — PROCHLORPERAZINE MALEATE 10 MG PO TABS: 10.0000 mg | ORAL_TABLET | Freq: Four times a day (QID) | ORAL | Status: DC | PRN

## 2015-10-19 MED ORDER — LENALIDOMIDE 25 MG PO CAPS: 25.0000 mg | ORAL_CAPSULE | Freq: Every day | ORAL | Status: DC

## 2015-10-19 MED ORDER — DEXAMETHASONE 4 MG PO TABS: ORAL_TABLET | ORAL | Status: DC

## 2015-10-19 MED ORDER — ACYCLOVIR 400 MG PO TABS: 400.0000 mg | ORAL_TABLET | Freq: Two times a day (BID) | ORAL | Status: DC

## 2015-10-19 MED ORDER — ONDANSETRON HCL 8 MG PO TABS: 8.0000 mg | ORAL_TABLET | Freq: Two times a day (BID) | ORAL | Status: DC | PRN

## 2015-10-19 MED ORDER — LORAZEPAM 0.5 MG PO TABS: 0.5000 mg | ORAL_TABLET | Freq: Four times a day (QID) | ORAL | Status: DC | PRN

## 2015-10-20 ENCOUNTER — Encounter: Payer: Self-pay | Admitting: Hematology

## 2015-10-20 ENCOUNTER — Telehealth: Payer: Self-pay | Admitting: Hematology

## 2015-10-20 ENCOUNTER — Other Ambulatory Visit: Payer: Self-pay | Admitting: *Deleted

## 2015-10-20 ENCOUNTER — Other Ambulatory Visit: Payer: Self-pay

## 2015-10-20 ENCOUNTER — Ambulatory Visit (HOSPITAL_BASED_OUTPATIENT_CLINIC_OR_DEPARTMENT_OTHER): Payer: BLUE CROSS/BLUE SHIELD | Admitting: Hematology

## 2015-10-20 ENCOUNTER — Telehealth: Payer: Self-pay | Admitting: *Deleted

## 2015-10-20 VITALS — BP 137/80 | HR 95 | Temp 98.3°F | Resp 18 | Ht 68.0 in | Wt 128.5 lb

## 2015-10-20 DIAGNOSIS — C9000 Multiple myeloma not having achieved remission: Secondary | ICD-10-CM

## 2015-10-20 DIAGNOSIS — D630 Anemia in neoplastic disease: Secondary | ICD-10-CM | POA: Diagnosis not present

## 2015-10-20 DIAGNOSIS — R530 Neoplastic (malignant) related fatigue: Secondary | ICD-10-CM

## 2015-10-20 MED ORDER — ASPIRIN EC 325 MG PO TBEC: 325.0000 mg | DELAYED_RELEASE_TABLET | Freq: Every day | ORAL | Status: DC

## 2015-10-20 MED ORDER — LENALIDOMIDE 25 MG PO CAPS: 25.0000 mg | ORAL_CAPSULE | Freq: Every day | ORAL | Status: DC

## 2015-10-20 MED ORDER — LORAZEPAM 0.5 MG PO TABS: 0.5000 mg | ORAL_TABLET | Freq: Four times a day (QID) | ORAL | Status: DC | PRN

## 2015-10-20 NOTE — Telephone Encounter (Signed)
Per staff message and POF I have scheduled appts. Advised scheduler of appts. JMW  

## 2015-10-20 NOTE — Telephone Encounter (Signed)
per pof to sch pt appt-MW sch pt trmt-gave pt copyo f avs °

## 2015-10-21 ENCOUNTER — Other Ambulatory Visit: Payer: Self-pay

## 2015-10-21 ENCOUNTER — Encounter: Payer: Self-pay | Admitting: *Deleted

## 2015-10-21 NOTE — Progress Notes (Signed)
Reviewed side effects of Revlimid, Decadron and Velcade in chemo class today.  See education flowsheet

## 2015-10-21 NOTE — Progress Notes (Signed)
Paula Johnson    HEMATOLOGY/ONCOLOGY CONSULTATION NOTE  Date of Service: 10/21/2015  Patient Care Team: [name], MD as PCP - General (Family Medicine)  CHIEF COMPLAINTS/PURPOSE OF CONSULTATION:  Follow up for new diagnosis of multiple myeloma  DIAGNOSIS  IgA lambda R-ISS stage II multiple myeloma with innumerable lytic lesions in the calvarium, lesion in T7 vertebra and multiple lesions in the pelvis.  No significant bone pain at this time.  Planned treatment VRd x 4 cycles Zometa q4weeks Consolidation with Autologous HSCT at [facility] treatment  Prophylactic medications - aspirin 325 mg by mouth daily. - Acyclovir 400 mg oral twice daily.  HISTORY OF PRESENTING ILLNESS: Please see my initial consultation for details of her initial presentation.  INTERVAL HISTORY  Paula Johnson is here for her scheduled followup to review the results of her PET/CT scan, bone marrow biopsy and for further discussions regarding treatment of her underlying condition.  We discussed in details with results of these examinations that confirm a diagnosis of IgA lambda multiple myeloma. We discussed the meaning of his diagnosis, natural history, prognosis based on R-ISS stage II disease and treatment options.  She was given copies of all her imaging results as well as bone marrow biopsy results. She notes some fatigue from her anemia but no other acute new concerns.  MEDICAL HISTORY:  Past Medical History  Diagnosis Date  . Cancer ([place]) 1999    squamous cell carcinoma of thymus  . Prediabetes   . Fibroid     reason for Hysterectomy  . Urinary incontinence   . PONV (postoperative nausea and vomiting)     thinks morphine caused nausea  . Pain aggravated by activities of daily living     states she pulled something at right sternum in dec 2016    SURGICAL HISTORY: Past Surgical History  Procedure Laterality Date  . Thymectomy  1999  . Abdominal hysterectomy  1997    [facility]--ovaries remain--Dr. Newton Pigg      SOCIAL HISTORY: Social History   Social History  . Marital Status: Widowed    Spouse Name: N/A  . Number of Children: N/A  . Years of Education: N/A   Occupational History  . Not on file.   Social History Main Topics  . Smoking status: Never Smoker   . Smokeless tobacco: Never Used  . Alcohol Use: No  . Drug Use: No  . Sexual Activity:    Partners: Male    Birth Control/ Protection: Surgical     Comment: [facility]--ovaries remain   Other Topics Concern  . Not on file   Social History Narrative    FAMILY HISTORY: Family History  Problem Relation Age of Onset  . COPD Father     dec age 76  . Diabetes Brother   . Other Brother     committed suicide age 54  . Hyperlipidemia Mother   . Diabetes Maternal Grandmother     ALLERGIES:  is allergic to ampicillin.  MEDICATIONS:  Current Outpatient Prescriptions  Medication Sig Dispense Refill  . acyclovir (ZOVIRAX) 400 MG tablet Take 1 tablet (400 mg total) by mouth 2 (two) times daily. 60 tablet 3  . aspirin EC 325 MG tablet Take 1 tablet (325 mg total) by mouth daily. 60 tablet 3  . dexamethasone (DECADRON) 4 MG tablet Take 10 tablets (40 mg) on days 1, 8, and 15 of chemo. Repeat every 21 days. 30 tablet 3  . lenalidomide (REVLIMID) 25 MG capsule Take 1 capsule (25 mg total) by  mouth daily. Take 1 capsule on days 1-14. Repeat every 21 days. Authorization # [id number] (Valid for 30days) 14 capsule 3  . LORazepam (ATIVAN) 0.5 MG tablet Take 1 tablet (0.5 mg total) by mouth every 6 (six) hours as needed (Nausea or vomiting). 30 tablet 0  . ondansetron (ZOFRAN) 8 MG tablet Take 1 tablet (8 mg total) by mouth 2 (two) times daily as needed (Nausea or vomiting). 30 tablet 1  . prochlorperazine (COMPAZINE) 10 MG tablet Take 1 tablet (10 mg total) by mouth every 6 (six) hours as needed (Nausea or vomiting). 30 tablet 1   No current facility-administered medications for this visit.    REVIEW OF SYSTEMS:    10 Point review of  Systems was done is negative except as noted above.  PHYSICAL EXAMINATION: ECOG PERFORMANCE STATUS: 1 - Symptomatic but completely ambulatory  . Filed Vitals:   10/20/15 1012  BP: 137/80  Pulse: 95  Temp: 98.3 F (36.8 C)  Resp: 18   Filed Weights   10/20/15 1012  Weight: 128 lb 8 oz (58.287 kg)   .Body mass index is 19.54 kg/(m^2).  GENERAL:alert, in no acute distress and comfortable SKIN: skin color, texture, turgor are normal, no rashes or significant lesions EYES: normal, conjunctiva are pink and non-injected, sclera clear OROPHARYNX:no exudate, no erythema and lips, buccal mucosa, and tongue normal  NECK: supple, no JVD, thyroid normal size, non-tender, without nodularity LYMPH:  no palpable lymphadenopathy in the cervical, axillary or inguinal LUNGS: clear to auscultation with normal respiratory effort HEART: regular rate & rhythm,  no murmurs and no lower extremity edema ABDOMEN: abdomen soft, non-tender, normoactive bowel sounds  Musculoskeletal: no cyanosis of digits and no clubbing  PSYCH: alert & oriented x 3 with fluent speech NEURO: no focal motor/sensory deficits  LABORATORY DATA:  I have reviewed the data as listed Outside labs from 10/19/2015 showed  CBC with a hemoglobin of 9.3, MCV 28.7, WBC count of 5.7k, platelets 366k CMP showed sodium of 136, potassium 4.3, creatinine 0.76, calcium 9.5, albumin 3.1, total protein 10.6. Paula Johnson CBC Latest Ref Rng 10/13/2015  WBC 4.0 - 10.5 K/uL 5.2  Hemoglobin 12.0 - 15.0 g/dL 8.8(L)  Hematocrit 36.0 - 46.0 % 27.1(L)  Platelets 150 - 400 K/uL 335    .No flowsheet data found.   Outside labs SPEP with 2 monoclonal bands 3.7 g/dL and the second band 1.2 g/dL (total 4.9 g/dl M protein). IFE Showed monoclonal IgA lambda protein.   LDH 141  Sedimentation rate 143 Beta-2 microglobulin 4.85 Albumin 3.5     CYTOGENETICS:  About 75% clone of plasma cells showing t(11:14) mutation consistent with multiple myeloma on  FISH  RADIOGRAPHIC STUDIES: I have personally reviewed the radiological images as listed and agreed with the findings in the report. Dg Chest 2 View  09/29/2015  CLINICAL DATA:  Cough.  Heard pop in center of chest.  Pain. EXAM: CHEST  2 VIEW COMPARISON:  12/25/2011 FINDINGS: Prior median sternotomy. Heart and mediastinal contours are within normal limits. No focal opacities or effusions. No acute bony abnormality. IMPRESSION: No active cardiopulmonary disease. Electronically Signed   By: Rolm Baptise M.D.   On: 09/29/2015 16:43   Nm Pet Image Initial (pi) Whole Body  10/12/2015  CLINICAL DATA:  Initial treatment strategy for concern for multiple myeloma. Staging. History of malignant neoplasm of thymus. EXAM: NUCLEAR MEDICINE PET WHOLE BODY TECHNIQUE: 7.2 mCi F-18 FDG was injected intravenously. Full-ring PET imaging was performed from the vertex to the  feet after the radiotracer. CT data was obtained and used for attenuation correction and anatomic localization. FASTING BLOOD GLUCOSE:  Value:  93 mg/dl COMPARISON:  Chest radiograph 09/29/2015. FINDINGS: HEAD/ NECK No areas of abnormal hypermetabolism. CHEST No extraosseous hypermetabolism identified. ABDOMEN/PELVIS No extraosseous hypermetabolism identified. SKELETON Hypermetabolism which corresponds to subtle fracture involving the lateral left fourth rib. This measures a S.U.V. max of 5.5, including on image 94/ series 4. No well-defined lytic lesion identified. Hypermetabolism within the sternum is favored to be postsurgical, status post median sternotomy. Equivocal heterogeneous activity involving the pelvis and thoracolumbar spine. There is paucity of activity involving the upper thoracic spine, favored to be due to prior radiation. CT IMAGES PERFORMED FOR ATTENUATION CORRECTION Innumerable lytic lesions throughout the calvarium, including on image 10/series 4. No cervical adenopathy. Left greater than right carotid atherosclerosis. Mild cardiomegaly.  Vague increased density in the left upper lobe is favored to represent subsegmental atelectasis. Punctate upper pole left renal collecting system calculus. Hysterectomy. Pessary in place. Heterogeneous marrow density throughout. Innumerable small lytic lesions including within the iliac bones bilaterally. Right-sided vertebral body lesion at approximately T7 is relatively ill-defined, including on image 89/ series 4. Approximately 13 mm. IMPRESSION: 1. Extensive heterogeneous marrow density with innumerable lytic lesions, especially in the calvarium. Findings are consistent with multiple myeloma. Disease is relatively PET occutl. A left lateral fourth rib fracture is hypermetabolic and could be pathologic. Heterogeneous activity within the pelvis and spine is indeterminate. 2.   No evidence of extraosseous myeloma. Electronically Signed   By: Abigail Miyamoto M.D.   On: 10/12/2015 16:02   Ct Biopsy  10/13/2015  CLINICAL DATA:  Anemia, plasma cell dyscrasia, concern for myeloma EXAM: CT GUIDED RIGHT ILIAC BONE MARROW ASPIRATION AND CORE BIOPSY Date:  1/18/20171/18/2017 9:34 am Radiologist:  M. Daryll Brod, MD Guidance:  CT FLUOROSCOPY TIME:  NONE. MEDICATIONS AND MEDICAL HISTORY: 2 mg Versed, 75 mcg fentanyl ANESTHESIA/SEDATION: 10 minutes, the patient's level of consciousness and physiological status was monitored by radiology nursing. CONTRAST:  None. COMPLICATIONS: None immediate PROCEDURE: Informed consent was obtained from the patient following explanation of the procedure, risks, benefits and alternatives. The patient understands, agrees and consents for the procedure. All questions were addressed. A time out was performed. The patient was positioned prone and noncontrast localization CT was performed of the pelvis to demonstrate the iliac marrow spaces. Maximal barrier sterile technique utilized including caps, mask, sterile gowns, sterile gloves, large sterile drape, hand hygiene, and betadine prep. Under  sterile conditions and local anesthesia, an 11 gauge coaxial bone biopsy needle was advanced into the right iliac marrow space. Needle position was confirmed with CT imaging. Initially, bone marrow aspiration was performed. Next, the 11 gauge outer cannula was utilized to obtain a right iliac bone marrow core biopsy. Needle was removed. Hemostasis was obtained with compression. The patient tolerated the procedure well. Samples were prepared with the cytotechnologist. No immediate complications. IMPRESSION: CT guided right iliac bone marrow aspiration and core biopsy. Electronically Signed   By: Jerilynn Mages.  Shick M.D.   On: 10/13/2015 10:00    ASSESSMENT & PLAN:    Very pleasant 60 year old Caucasian female with  1) newly diagnosed R-ISS Stage II IgA lambda multiple myeloma with standard risk cytogenetics t (11;14). PET/CT shows evidence of lytic bone lesions in the calvarium, iliac bones  and T7. No hypercalcemia or renal failure at this time. Pretreatment M spike of 4.9 in 2 monoclonal bands.  #2 anemia due to multiple myeloma #3 fatigue due  to multiple abdomen anemia. #4 multiple bone metastases from multiple myeloma. Plan  -A discussed in detail the patient's diagnosis, test results, prognosis, natural history of disease and treatment options. -After significant discussion patient is willing to proceed with VRd regimen  -Zometa every 4 weekly for bone metastases. -Aspirin daily for VTE prophylaxis with Revlimid -acyclovir for shingles prophylaxis ( patient notes that she did get a shingles vaccine about 2 years ago). -given information for Dr. Dione Housekeeper for a 2nd opinion at [facility] -Would plan to do 4 cycles of VRd and then consider consolidation with an Autologus HSCT. -Recommended the patient stop all her multiple nutritional supplements to avoid risk of medication interaction - patient is agreeable to doing this. -She will also stop her esterase ointment to reduce her risk of  VTE -pre-medications for the treatment protocol ordered. -chemo-counselling appointment with chemo-nurses.  RTC with Dr Irene Limbo in 2 weeks for toxicity check.  All of the patients and her partners questions were answered to their apparent satisfaction. The patient knows to call the clinic with any problems, questions or concerns.  I spent 40 minutes counseling the patient face to face. The total time spent in the appointment was 50 minutes and more than 50% was on counseling and direct patient cares.    Sullivan Lone MD [place] AAHIVMS [facility] Hematology/Oncology Physician [facility]  (Office):       [phone] (Work cell):  [phone] (Fax):           [phone]

## 2015-10-22 LAB — CHROMOSOME ANALYSIS, BONE MARROW

## 2015-10-22 LAB — TISSUE HYBRIDIZATION (BONE MARROW)-NCBH

## 2015-10-25 ENCOUNTER — Ambulatory Visit (HOSPITAL_BASED_OUTPATIENT_CLINIC_OR_DEPARTMENT_OTHER): Payer: BLUE CROSS/BLUE SHIELD

## 2015-10-25 ENCOUNTER — Other Ambulatory Visit (HOSPITAL_BASED_OUTPATIENT_CLINIC_OR_DEPARTMENT_OTHER): Payer: BLUE CROSS/BLUE SHIELD

## 2015-10-25 DIAGNOSIS — Z5112 Encounter for antineoplastic immunotherapy: Secondary | ICD-10-CM | POA: Diagnosis not present

## 2015-10-25 DIAGNOSIS — C9000 Multiple myeloma not having achieved remission: Secondary | ICD-10-CM | POA: Diagnosis not present

## 2015-10-25 LAB — COMPREHENSIVE METABOLIC PANEL
ALBUMIN: 3 g/dL — AB (ref 3.5–5.0)
ALK PHOS: 57 U/L (ref 40–150)
ALT: 15 U/L (ref 0–55)
AST: 16 U/L (ref 5–34)
Anion Gap: 16 mEq/L — ABNORMAL HIGH (ref 3–11)
BILIRUBIN TOTAL: 0.36 mg/dL (ref 0.20–1.20)
BUN: 29.8 mg/dL — AB (ref 7.0–26.0)
CALCIUM: 10.4 mg/dL (ref 8.4–10.4)
CO2: 18 mEq/L — ABNORMAL LOW (ref 22–29)
CREATININE: 1 mg/dL (ref 0.6–1.1)
Chloride: 104 mEq/L (ref 98–109)
EGFR: 61 mL/min/{1.73_m2} — ABNORMAL LOW (ref >90)
Glucose: 168 mg/dl — ABNORMAL HIGH (ref 70–140)
POTASSIUM: 4.4 meq/L (ref 3.5–5.1)
Sodium: 138 mEq/L (ref 136–145)

## 2015-10-25 LAB — CBC & DIFF AND RETIC
BASO%: 0.7 % (ref 0.0–2.0)
BASOS ABS: 0 10*3/uL (ref 0.0–0.1)
EOS%: 0.1 % (ref 0.0–7.0)
Eosinophils Absolute: 0 10*3/uL (ref 0.0–0.5)
HEMATOCRIT: 27.8 % — AB (ref 34.8–46.6)
HEMOGLOBIN: 9.2 g/dL — AB (ref 11.6–15.9)
Immature Retic Fract: 15.2 % — ABNORMAL HIGH (ref 1.60–10.00)
LYMPH%: 15.7 % (ref 14.0–49.7)
MCH: 30.1 pg (ref 25.1–34.0)
MCHC: 33.2 g/dL (ref 31.5–36.0)
MCV: 90.7 fL (ref 79.5–101.0)
MONO#: 0.1 10*3/uL (ref 0.1–0.9)
MONO%: 1.6 % (ref 0.0–14.0)
NEUT#: 5.2 10*3/uL (ref 1.5–6.5)
NEUT%: 81.9 % — ABNORMAL HIGH (ref 38.4–76.8)
Platelets: 357 10*3/uL (ref 145–400)
RBC: 3.07 10*6/uL — ABNORMAL LOW (ref 3.70–5.45)
RDW: 17 % — AB (ref 11.2–14.5)
RETIC %: 1.63 % (ref 0.70–2.10)
Retic Ct Abs: 50.04 10*3/uL (ref 33.70–90.70)
WBC: 6.3 10*3/uL (ref 3.9–10.3)
lymph#: 1 10*3/uL (ref 0.9–3.3)

## 2015-10-25 MED ORDER — SODIUM CHLORIDE 0.9 % IV SOLN: Freq: Once | INTRAVENOUS | Status: DC

## 2015-10-25 MED ORDER — ZOLEDRONIC ACID 4 MG/100ML IV SOLN
4.0000 mg | Freq: Once | INTRAVENOUS | Status: AC
  Administered 2015-10-25: 4 mg via INTRAVENOUS
  Filled 2015-10-25: qty 100

## 2015-10-25 MED ORDER — BORTEZOMIB CHEMO SQ INJECTION 3.5 MG (2.5MG/ML)
1.3000 mg/m2 | Freq: Once | INTRAMUSCULAR | Status: AC
  Administered 2015-10-25: 2.25 mg via SUBCUTANEOUS
  Filled 2015-10-25: qty 2.25

## 2015-10-25 MED ORDER — PROCHLORPERAZINE MALEATE 10 MG PO TABS
ORAL_TABLET | ORAL | Status: AC
  Filled 2015-10-25: qty 1

## 2015-10-25 MED ORDER — PROCHLORPERAZINE MALEATE 10 MG PO TABS
10.0000 mg | ORAL_TABLET | Freq: Once | ORAL | Status: AC
  Administered 2015-10-25: 10 mg via ORAL

## 2015-10-25 MED ORDER — SODIUM CHLORIDE 0.9 % IV SOLN
Freq: Once | INTRAVENOUS | Status: AC
  Administered 2015-10-25: 16:00:00 via INTRAVENOUS

## 2015-10-25 MED ORDER — BORTEZOMIB CHEMO IV INJECTION 3.5 MG: 1.3000 mg/m2 | Freq: Once | INTRAMUSCULAR | Status: DC

## 2015-10-25 MED ORDER — PROCHLORPERAZINE MALEATE 10 MG PO TABS: 10.0000 mg | ORAL_TABLET | Freq: Once | ORAL | Status: DC

## 2015-10-25 NOTE — Patient Instructions (Signed)
[facility] Discharge Instructions for Patients Receiving Chemotherapy  Today you received the following chemotherapy agents velcade injection.   If you develop nausea and vomiting that is not controlled by your nausea medication, call the clinic.   BELOW ARE SYMPTOMS THAT SHOULD BE REPORTED IMMEDIATELY:  *FEVER GREATER THAN 100.5 F  *CHILLS WITH OR WITHOUT FEVER  NAUSEA AND VOMITING THAT IS NOT CONTROLLED WITH YOUR NAUSEA MEDICATION  *UNUSUAL SHORTNESS OF BREATH  *UNUSUAL BRUISING OR BLEEDING  TENDERNESS IN MOUTH AND THROAT WITH OR WITHOUT PRESENCE OF ULCERS  *URINARY PROBLEMS  *BOWEL PROBLEMS  UNUSUAL RASH Items with * indicate a potential emergency and should be followed up as soon as possible.  Feel free to call the clinic you have any questions or concerns. The clinic phone number is [phone].  Please show the CHEMO ALERT CARD at check-in to the Emergency Department and triage nurse.   

## 2015-10-26 ENCOUNTER — Other Ambulatory Visit: Payer: Self-pay | Admitting: Hematology

## 2015-10-26 LAB — VITAMIN D 25 HYDROXY (VIT D DEFICIENCY, FRACTURES): VIT D 25 HYDROXY: 50.3 ng/mL (ref 30.0–100.0)

## 2015-10-27 LAB — MULTIPLE MYELOMA PANEL, SERUM
ALBUMIN/GLOB SERPL: 0.6 — AB (ref 0.7–1.7)
Albumin SerPl Elph-Mcnc: 4.1 g/dL (ref 2.9–4.4)
Alpha 1: 0.3 g/dL (ref 0.0–0.4)
Alpha2 Glob SerPl Elph-Mcnc: 0.7 g/dL (ref 0.4–1.0)
B-GLOBULIN SERPL ELPH-MCNC: 6 g/dL — AB (ref 0.7–1.3)
GAMMA GLOB SERPL ELPH-MCNC: 0.2 g/dL — AB (ref 0.4–1.8)
GLOBULIN, TOTAL: 7.2 g/dL — AB (ref 2.2–3.9)
IgA, Qn, Serum: 6305 mg/dL — ABNORMAL HIGH (ref 87–352)
IgG, Qn, Serum: 311 mg/dL — ABNORMAL LOW (ref 700–1600)
IgM, Qn, Serum: 10 mg/dL — ABNORMAL LOW (ref 26–217)
M PROTEIN SERPL ELPH-MCNC: 5 g/dL — AB
TOTAL PROTEIN: 11.3 g/dL — AB (ref 6.0–8.5)

## 2015-10-28 ENCOUNTER — Other Ambulatory Visit (HOSPITAL_BASED_OUTPATIENT_CLINIC_OR_DEPARTMENT_OTHER): Payer: BLUE CROSS/BLUE SHIELD

## 2015-10-28 ENCOUNTER — Telehealth: Payer: Self-pay | Admitting: Pharmacist

## 2015-10-28 ENCOUNTER — Ambulatory Visit (HOSPITAL_BASED_OUTPATIENT_CLINIC_OR_DEPARTMENT_OTHER): Payer: BLUE CROSS/BLUE SHIELD

## 2015-10-28 DIAGNOSIS — Z5112 Encounter for antineoplastic immunotherapy: Secondary | ICD-10-CM

## 2015-10-28 DIAGNOSIS — C9000 Multiple myeloma not having achieved remission: Secondary | ICD-10-CM

## 2015-10-28 LAB — COMPREHENSIVE METABOLIC PANEL
ALBUMIN: 2.7 g/dL — AB (ref 3.5–5.0)
ALK PHOS: 55 U/L (ref 40–150)
ALT: 14 U/L (ref 0–55)
ANION GAP: 13 meq/L — AB (ref 3–11)
AST: 17 U/L (ref 5–34)
BUN: 25.4 mg/dL (ref 7.0–26.0)
CALCIUM: 8.4 mg/dL (ref 8.4–10.4)
CHLORIDE: 106 meq/L (ref 98–109)
CO2: 21 mEq/L — ABNORMAL LOW (ref 22–29)
CREATININE: 1.1 mg/dL (ref 0.6–1.1)
EGFR: 53 mL/min/{1.73_m2} — ABNORMAL LOW (ref >90)
Glucose: 97 mg/dl (ref 70–140)
Potassium: 4.2 mEq/L (ref 3.5–5.1)
Sodium: 140 mEq/L (ref 136–145)
TOTAL PROTEIN: 11.1 g/dL — AB (ref 6.4–8.3)

## 2015-10-28 LAB — CBC WITH DIFFERENTIAL/PLATELET
BASO%: 0.4 % (ref 0.0–2.0)
Basophils Absolute: 0 10*3/uL (ref 0.0–0.1)
EOS%: 2 % (ref 0.0–7.0)
Eosinophils Absolute: 0.1 10*3/uL (ref 0.0–0.5)
HEMATOCRIT: 26.6 % — AB (ref 34.8–46.6)
HEMOGLOBIN: 8.5 g/dL — AB (ref 11.6–15.9)
LYMPH#: 1.9 10*3/uL (ref 0.9–3.3)
LYMPH%: 42.2 % (ref 14.0–49.7)
MCH: 29.7 pg (ref 25.1–34.0)
MCHC: 32 g/dL (ref 31.5–36.0)
MCV: 93 fL (ref 79.5–101.0)
MONO#: 0.6 10*3/uL (ref 0.1–0.9)
MONO%: 12.5 % (ref 0.0–14.0)
NEUT#: 2 10*3/uL (ref 1.5–6.5)
NEUT%: 42.9 % (ref 38.4–76.8)
PLATELETS: 284 10*3/uL (ref 145–400)
RBC: 2.86 10*6/uL — ABNORMAL LOW (ref 3.70–5.45)
RDW: 17.2 % — AB (ref 11.2–14.5)
WBC: 4.6 10*3/uL (ref 3.9–10.3)

## 2015-10-28 MED ORDER — BORTEZOMIB CHEMO SQ INJECTION 3.5 MG (2.5MG/ML)
1.3000 mg/m2 | Freq: Once | INTRAMUSCULAR | Status: AC
  Administered 2015-10-28: 2.25 mg via SUBCUTANEOUS
  Filled 2015-10-28: qty 2.25

## 2015-10-28 MED ORDER — PROCHLORPERAZINE MALEATE 10 MG PO TABS
10.0000 mg | ORAL_TABLET | Freq: Once | ORAL | Status: AC
  Administered 2015-10-28: 10 mg via ORAL

## 2015-10-28 MED ORDER — PROCHLORPERAZINE MALEATE 10 MG PO TABS
ORAL_TABLET | ORAL | Status: AC
Start: 2015-10-28 — End: 2015-10-28
  Filled 2015-10-28: qty 1

## 2015-10-28 NOTE — Telephone Encounter (Signed)
10/28/15: Revlimid still not delivered to the patient. Rx has been sent to multiple specialty pharmacies. First sent to [facility]. Once insurance authorization obtained it was determined that [facility] specialty would need to fill Revlimid. Rx faxed to [facility] on 10/27/15. I verified the Rx with [facility] for Revlimid on 2/2. Patient will call today to request delivery. Per [name]: at this point since today is Day 4 and patient will not receive Revlimid until day 5 cycle 1 at the earliest we will hold off on starting Revlimid until cycle 2 (11/15/15).

## 2015-10-28 NOTE — Patient Instructions (Signed)
[place] Discharge Instructions for Patients Receiving Chemotherapy  Today you received the following chemotherapy agents: Velcade.  To help prevent nausea and vomiting after your treatment, we encourage you to take your nausea medication: compazine 10 mg every 6 hours as needed, Zofran 8 mg every 12 hours as needed.   If you develop nausea and vomiting that is not controlled by your nausea medication, call the clinic.   BELOW ARE SYMPTOMS THAT SHOULD BE REPORTED IMMEDIATELY:  *FEVER GREATER THAN 100.5 F  *CHILLS WITH OR WITHOUT FEVER  NAUSEA AND VOMITING THAT IS NOT CONTROLLED WITH YOUR NAUSEA MEDICATION  *UNUSUAL SHORTNESS OF BREATH  *UNUSUAL BRUISING OR BLEEDING  TENDERNESS IN MOUTH AND THROAT WITH OR WITHOUT PRESENCE OF ULCERS  *URINARY PROBLEMS  *BOWEL PROBLEMS  UNUSUAL RASH Items with * indicate a potential emergency and should be followed up as soon as possible.  Feel free to call the clinic you have any questions or concerns. The clinic phone number is [phone].  Please show the [place] at check-in to the Emergency Department and triage nurse.

## 2015-11-01 ENCOUNTER — Encounter: Payer: Self-pay | Admitting: Hematology

## 2015-11-01 ENCOUNTER — Telehealth: Payer: Self-pay | Admitting: Hematology

## 2015-11-01 ENCOUNTER — Other Ambulatory Visit (HOSPITAL_BASED_OUTPATIENT_CLINIC_OR_DEPARTMENT_OTHER): Payer: BLUE CROSS/BLUE SHIELD

## 2015-11-01 ENCOUNTER — Ambulatory Visit (HOSPITAL_BASED_OUTPATIENT_CLINIC_OR_DEPARTMENT_OTHER): Payer: BLUE CROSS/BLUE SHIELD

## 2015-11-01 ENCOUNTER — Ambulatory Visit (HOSPITAL_BASED_OUTPATIENT_CLINIC_OR_DEPARTMENT_OTHER): Payer: BLUE CROSS/BLUE SHIELD | Admitting: Hematology

## 2015-11-01 VITALS — BP 148/82 | HR 92 | Temp 98.3°F | Resp 18 | Ht 68.0 in | Wt 130.7 lb

## 2015-11-01 DIAGNOSIS — R21 Rash and other nonspecific skin eruption: Secondary | ICD-10-CM | POA: Insufficient documentation

## 2015-11-01 DIAGNOSIS — Z5112 Encounter for antineoplastic immunotherapy: Secondary | ICD-10-CM

## 2015-11-01 DIAGNOSIS — C9000 Multiple myeloma not having achieved remission: Secondary | ICD-10-CM | POA: Diagnosis not present

## 2015-11-01 DIAGNOSIS — D630 Anemia in neoplastic disease: Secondary | ICD-10-CM

## 2015-11-01 DIAGNOSIS — R530 Neoplastic (malignant) related fatigue: Secondary | ICD-10-CM | POA: Diagnosis not present

## 2015-11-01 HISTORY — DX: Rash and other nonspecific skin eruption: R21

## 2015-11-01 LAB — COMPREHENSIVE METABOLIC PANEL
ALT: 23 U/L (ref 0–55)
ANION GAP: 15 meq/L — AB (ref 3–11)
AST: 18 U/L (ref 5–34)
Albumin: 2.9 g/dL — ABNORMAL LOW (ref 3.5–5.0)
Alkaline Phosphatase: 56 U/L (ref 40–150)
BILIRUBIN TOTAL: 0.31 mg/dL (ref 0.20–1.20)
BUN: 21.6 mg/dL (ref 7.0–26.0)
CHLORIDE: 103 meq/L (ref 98–109)
CO2: 21 meq/L — AB (ref 22–29)
CREATININE: 0.9 mg/dL (ref 0.6–1.1)
Calcium: 9.2 mg/dL (ref 8.4–10.4)
EGFR: 71 mL/min/{1.73_m2} — ABNORMAL LOW (ref >90)
GLUCOSE: 179 mg/dL — AB (ref 70–140)
Potassium: 4.6 mEq/L (ref 3.5–5.1)
Sodium: 139 mEq/L (ref 136–145)
Total Protein: 12 g/dL — ABNORMAL HIGH (ref 6.4–8.3)

## 2015-11-01 LAB — CBC WITH DIFFERENTIAL/PLATELET
BASO%: 0.2 % (ref 0.0–2.0)
Basophils Absolute: 0 10*3/uL (ref 0.0–0.1)
EOS ABS: 0 10*3/uL (ref 0.0–0.5)
EOS%: 0.2 % (ref 0.0–7.0)
HEMATOCRIT: 28.6 % — AB (ref 34.8–46.6)
HGB: 9.2 g/dL — ABNORMAL LOW (ref 11.6–15.9)
LYMPH#: 0.8 10*3/uL — AB (ref 0.9–3.3)
LYMPH%: 17.3 % (ref 14.0–49.7)
MCH: 30 pg (ref 25.1–34.0)
MCHC: 32.2 g/dL (ref 31.5–36.0)
MCV: 93.2 fL (ref 79.5–101.0)
MONO#: 0.2 10*3/uL (ref 0.1–0.9)
MONO%: 4.3 % (ref 0.0–14.0)
NEUT#: 3.7 10*3/uL (ref 1.5–6.5)
NEUT%: 78 % — AB (ref 38.4–76.8)
PLATELETS: 276 10*3/uL (ref 145–400)
RBC: 3.07 10*6/uL — ABNORMAL LOW (ref 3.70–5.45)
RDW: 17.5 % — ABNORMAL HIGH (ref 11.2–14.5)
WBC: 4.7 10*3/uL (ref 3.9–10.3)

## 2015-11-01 MED ORDER — PROCHLORPERAZINE MALEATE 10 MG PO TABS
ORAL_TABLET | ORAL | Status: AC
  Filled 2015-11-01: qty 1

## 2015-11-01 MED ORDER — BORTEZOMIB CHEMO SQ INJECTION 3.5 MG (2.5MG/ML)
1.3000 mg/m2 | Freq: Once | INTRAMUSCULAR | Status: AC
  Administered 2015-11-01: 2.25 mg via SUBCUTANEOUS
  Filled 2015-11-01: qty 2.25

## 2015-11-01 MED ORDER — ONDANSETRON HCL 8 MG PO TABS
ORAL_TABLET | ORAL | Status: AC
  Filled 2015-11-01: qty 1

## 2015-11-01 MED ORDER — PROCHLORPERAZINE MALEATE 10 MG PO TABS
10.0000 mg | ORAL_TABLET | Freq: Once | ORAL | Status: AC
  Administered 2015-11-01: 10 mg via ORAL

## 2015-11-01 NOTE — Patient Instructions (Signed)
[place] Discharge Instructions for Patients Receiving Chemotherapy  Today you received the following chemotherapy agents: Velcade.  To help prevent nausea and vomiting after your treatment, we encourage you to take your nausea medication: compazine 10 mg every 6 hours as needed, Zofran 8 mg every 12 hours as needed.   If you develop nausea and vomiting that is not controlled by your nausea medication, call the clinic.   BELOW ARE SYMPTOMS THAT SHOULD BE REPORTED IMMEDIATELY:  *FEVER GREATER THAN 100.5 F  *CHILLS WITH OR WITHOUT FEVER  NAUSEA AND VOMITING THAT IS NOT CONTROLLED WITH YOUR NAUSEA MEDICATION  *UNUSUAL SHORTNESS OF BREATH  *UNUSUAL BRUISING OR BLEEDING  TENDERNESS IN MOUTH AND THROAT WITH OR WITHOUT PRESENCE OF ULCERS  *URINARY PROBLEMS  *BOWEL PROBLEMS  UNUSUAL RASH Items with * indicate a potential emergency and should be followed up as soon as possible.  Feel free to call the clinic you have any questions or concerns. The clinic phone number is [phone].  Please show the [place] at check-in to the Emergency Department and triage nurse.

## 2015-11-01 NOTE — Telephone Encounter (Signed)
per pof tos ch pt appt-sent MW email to sch pt trmt-will call pt after reply °

## 2015-11-02 ENCOUNTER — Telehealth: Payer: Self-pay | Admitting: *Deleted

## 2015-11-02 NOTE — Telephone Encounter (Signed)
Per staff message and POF I have scheduled appts. Advised scheduler of appts. JMW  

## 2015-11-03 ENCOUNTER — Telehealth: Payer: Self-pay | Admitting: Hematology

## 2015-11-03 NOTE — Telephone Encounter (Signed)
per pof to sch pt appt-cld & spoke to pt and gave appts

## 2015-11-04 ENCOUNTER — Encounter: Payer: Self-pay | Admitting: Hematology

## 2015-11-04 ENCOUNTER — Other Ambulatory Visit (HOSPITAL_BASED_OUTPATIENT_CLINIC_OR_DEPARTMENT_OTHER): Payer: BLUE CROSS/BLUE SHIELD

## 2015-11-04 ENCOUNTER — Ambulatory Visit (HOSPITAL_BASED_OUTPATIENT_CLINIC_OR_DEPARTMENT_OTHER): Payer: BLUE CROSS/BLUE SHIELD

## 2015-11-04 VITALS — BP 152/92 | HR 80 | Temp 97.2°F

## 2015-11-04 DIAGNOSIS — C9000 Multiple myeloma not having achieved remission: Secondary | ICD-10-CM

## 2015-11-04 DIAGNOSIS — Z5112 Encounter for antineoplastic immunotherapy: Secondary | ICD-10-CM | POA: Diagnosis not present

## 2015-11-04 LAB — CBC WITH DIFFERENTIAL/PLATELET
BASO%: 1.2 % (ref 0.0–2.0)
BASOS ABS: 0 10*3/uL (ref 0.0–0.1)
EOS ABS: 0.1 10*3/uL (ref 0.0–0.5)
EOS%: 1.4 % (ref 0.0–7.0)
HEMATOCRIT: 24.5 % — AB (ref 34.8–46.6)
HEMOGLOBIN: 8.1 g/dL — AB (ref 11.6–15.9)
LYMPH#: 1.5 10*3/uL (ref 0.9–3.3)
LYMPH%: 36.5 % (ref 14.0–49.7)
MCH: 29.9 pg (ref 25.1–34.0)
MCHC: 32.9 g/dL (ref 31.5–36.0)
MCV: 90.9 fL (ref 79.5–101.0)
MONO#: 0.7 10*3/uL (ref 0.1–0.9)
MONO%: 17.9 % — ABNORMAL HIGH (ref 0.0–14.0)
NEUT%: 43 % (ref 38.4–76.8)
NEUTROS ABS: 1.8 10*3/uL (ref 1.5–6.5)
Platelets: 229 10*3/uL (ref 145–400)
RBC: 2.7 10*6/uL — ABNORMAL LOW (ref 3.70–5.45)
RDW: 17.1 % — AB (ref 11.2–14.5)
WBC: 4.1 10*3/uL (ref 3.9–10.3)

## 2015-11-04 LAB — COMPREHENSIVE METABOLIC PANEL
ALBUMIN: 2.7 g/dL — AB (ref 3.5–5.0)
ALK PHOS: 58 U/L (ref 40–150)
ALT: 25 U/L (ref 0–55)
AST: 19 U/L (ref 5–34)
Anion Gap: 14 mEq/L — ABNORMAL HIGH (ref 3–11)
BUN: 18.6 mg/dL (ref 7.0–26.0)
CO2: 23 meq/L (ref 22–29)
Calcium: 9.1 mg/dL (ref 8.4–10.4)
Chloride: 104 mEq/L (ref 98–109)
Creatinine: 0.9 mg/dL (ref 0.6–1.1)
EGFR: 74 mL/min/{1.73_m2} — ABNORMAL LOW (ref >90)
GLUCOSE: 104 mg/dL (ref 70–140)
POTASSIUM: 3.9 meq/L (ref 3.5–5.1)
SODIUM: 140 meq/L (ref 136–145)
TOTAL PROTEIN: 10.9 g/dL — AB (ref 6.4–8.3)
Total Bilirubin: 0.38 mg/dL (ref 0.20–1.20)

## 2015-11-04 MED ORDER — PROCHLORPERAZINE MALEATE 10 MG PO TABS
ORAL_TABLET | ORAL | Status: AC
  Filled 2015-11-04: qty 1

## 2015-11-04 MED ORDER — PROCHLORPERAZINE MALEATE 10 MG PO TABS
10.0000 mg | ORAL_TABLET | Freq: Once | ORAL | Status: AC
  Administered 2015-11-04: 10 mg via ORAL

## 2015-11-04 MED ORDER — BORTEZOMIB CHEMO SQ INJECTION 3.5 MG (2.5MG/ML)
1.3000 mg/m2 | Freq: Once | INTRAMUSCULAR | Status: AC
  Administered 2015-11-04: 2.25 mg via SUBCUTANEOUS
  Filled 2015-11-04: qty 2.25

## 2015-11-04 NOTE — Patient Instructions (Signed)
[facility] Discharge Instructions for Patients Receiving Chemotherapy  Today you received the following chemotherapy agents: Velcade.  To help prevent nausea and vomiting after your treatment, we encourage you to take your nausea medication: Compazine 10 mg every 6 hours as needed, Zofran 8 mg every 12 hours as needed.   If you develop nausea and vomiting that is not controlled by your nausea medication, call the clinic.   BELOW ARE SYMPTOMS THAT SHOULD BE REPORTED IMMEDIATELY:  *FEVER GREATER THAN 100.5 F  *CHILLS WITH OR WITHOUT FEVER  NAUSEA AND VOMITING THAT IS NOT CONTROLLED WITH YOUR NAUSEA MEDICATION  *UNUSUAL SHORTNESS OF BREATH  *UNUSUAL BRUISING OR BLEEDING  TENDERNESS IN MOUTH AND THROAT WITH OR WITHOUT PRESENCE OF ULCERS  *URINARY PROBLEMS  *BOWEL PROBLEMS  UNUSUAL RASH Items with * indicate a potential emergency and should be followed up as soon as possible.  Feel free to call the clinic you have any questions or concerns. The clinic phone number is [phone].  Please show the [place] at check-in to the Emergency Department and triage nurse.

## 2015-11-04 NOTE — Progress Notes (Signed)
Reviewed labs and pt status. Pt states she is feeling very fatigued, HGB 8.1 today and BP 152/92. Notified [name]. OK to treat per Darden Dates, RN per [name].  Pt will have labs again on Monday-may need transfusion next week. Pt advised of this and also to report any increasing fatigue, weakness, lightheadedness. Pt. Voiced understanding.

## 2015-11-04 NOTE — Progress Notes (Signed)
recd form in box

## 2015-11-05 ENCOUNTER — Ambulatory Visit (HOSPITAL_BASED_OUTPATIENT_CLINIC_OR_DEPARTMENT_OTHER): Payer: BLUE CROSS/BLUE SHIELD | Admitting: Nurse Practitioner

## 2015-11-05 ENCOUNTER — Encounter: Payer: Self-pay | Admitting: Hematology

## 2015-11-05 ENCOUNTER — Encounter (HOSPITAL_COMMUNITY): Payer: Self-pay

## 2015-11-05 ENCOUNTER — Telehealth: Payer: Self-pay | Admitting: *Deleted

## 2015-11-05 ENCOUNTER — Ambulatory Visit (HOSPITAL_COMMUNITY)
Admission: RE | Admit: 2015-11-05 | Discharge: 2015-11-05 | Disposition: A | Discharge status: Home / Self Care | Payer: BLUE CROSS/BLUE SHIELD | Source: Ambulatory Visit | Attending: Nurse Practitioner | Admitting: Nurse Practitioner

## 2015-11-05 ENCOUNTER — Ambulatory Visit (HOSPITAL_BASED_OUTPATIENT_CLINIC_OR_DEPARTMENT_OTHER): Payer: BLUE CROSS/BLUE SHIELD

## 2015-11-05 ENCOUNTER — Other Ambulatory Visit: Payer: Self-pay | Admitting: *Deleted

## 2015-11-05 VITALS — BP 155/74 | HR 117 | Temp 101.4°F | Resp 20 | Wt 135.1 lb

## 2015-11-05 DIAGNOSIS — C9000 Multiple myeloma not having achieved remission: Secondary | ICD-10-CM | POA: Diagnosis not present

## 2015-11-05 DIAGNOSIS — R509 Fever, unspecified: Secondary | ICD-10-CM | POA: Diagnosis not present

## 2015-11-05 DIAGNOSIS — R319 Hematuria, unspecified: Secondary | ICD-10-CM

## 2015-11-05 DIAGNOSIS — R21 Rash and other nonspecific skin eruption: Secondary | ICD-10-CM

## 2015-11-05 DIAGNOSIS — C37 Malignant neoplasm of thymus: Secondary | ICD-10-CM

## 2015-11-05 DIAGNOSIS — N200 Calculus of kidney: Secondary | ICD-10-CM | POA: Diagnosis not present

## 2015-11-05 DIAGNOSIS — R310 Gross hematuria: Secondary | ICD-10-CM | POA: Insufficient documentation

## 2015-11-05 LAB — COMPREHENSIVE METABOLIC PANEL
ALBUMIN: 2.8 g/dL — AB (ref 3.5–5.0)
ALK PHOS: 57 U/L (ref 40–150)
ALT: 26 U/L (ref 0–55)
AST: 25 U/L (ref 5–34)
Anion Gap: 15 mEq/L — ABNORMAL HIGH (ref 3–11)
BUN: 19.6 mg/dL (ref 7.0–26.0)
CO2: 19 mEq/L — ABNORMAL LOW (ref 22–29)
CREATININE: 0.8 mg/dL (ref 0.6–1.1)
Calcium: 9 mg/dL (ref 8.4–10.4)
Chloride: 104 mEq/L (ref 98–109)
EGFR: 75 mL/min/{1.73_m2} — ABNORMAL LOW (ref >90)
GLUCOSE: 88 mg/dL (ref 70–140)
Potassium: 3.9 mEq/L (ref 3.5–5.1)
SODIUM: 139 meq/L (ref 136–145)
TOTAL PROTEIN: 11.1 g/dL — AB (ref 6.4–8.3)
Total Bilirubin: 0.36 mg/dL (ref 0.20–1.20)

## 2015-11-05 LAB — URINALYSIS, MICROSCOPIC - CHCC
BILIRUBIN (URINE): NEGATIVE
GLUCOSE UR CHCC: NEGATIVE mg/dL
KETONES: NEGATIVE mg/dL
Leukocyte Esterase: NEGATIVE
Nitrite: NEGATIVE
PROTEIN: 100 mg/dL
Specific Gravity, Urine: 1.02 (ref 1.003–1.035)
Urobilinogen, UR: 0.2 mg/dL (ref 0.2–1)
pH: 5 (ref 4.6–8.0)

## 2015-11-05 LAB — CBC WITH DIFFERENTIAL/PLATELET
BASO%: 0.2 % (ref 0.0–2.0)
BASOS ABS: 0 10*3/uL (ref 0.0–0.1)
EOS ABS: 0 10*3/uL (ref 0.0–0.5)
EOS%: 0.4 % (ref 0.0–7.0)
HEMATOCRIT: 26.8 % — AB (ref 34.8–46.6)
HEMOGLOBIN: 8.8 g/dL — AB (ref 11.6–15.9)
LYMPH#: 0.7 10*3/uL — AB (ref 0.9–3.3)
LYMPH%: 8.7 % — ABNORMAL LOW (ref 14.0–49.7)
MCH: 29.9 pg (ref 25.1–34.0)
MCHC: 32.8 g/dL (ref 31.5–36.0)
MCV: 91.2 fL (ref 79.5–101.0)
MONO#: 0.4 10*3/uL (ref 0.1–0.9)
MONO%: 5.7 % (ref 0.0–14.0)
NEUT%: 85 % — ABNORMAL HIGH (ref 38.4–76.8)
NEUTROS ABS: 6.5 10*3/uL (ref 1.5–6.5)
Platelets: 245 10*3/uL (ref 145–400)
RBC: 2.94 10*6/uL — ABNORMAL LOW (ref 3.70–5.45)
RDW: 17.1 % — AB (ref 11.2–14.5)
WBC: 7.7 10*3/uL (ref 3.9–10.3)

## 2015-11-05 MED ORDER — ACETAMINOPHEN 325 MG PO TABS
ORAL_TABLET | ORAL | Status: AC
  Filled 2015-11-05: qty 2

## 2015-11-05 MED ORDER — SODIUM CHLORIDE 0.9 % IV SOLN
INTRAVENOUS | Status: AC
  Administered 2015-11-05: 13:00:00 via INTRAVENOUS

## 2015-11-05 MED ORDER — ACETAMINOPHEN 325 MG PO TABS
650.0000 mg | ORAL_TABLET | Freq: Once | ORAL | Status: AC
  Administered 2015-11-05: 650 mg via ORAL

## 2015-11-05 NOTE — Telephone Encounter (Signed)
Pt called and left message re: Pt had SQ Velcade yesterday ( not first time ).  Pt complained of fatigue, had chills and aching all over last night - woke up at 1 am and had difficulty going back to sleep.  After getting to work, pt developed shaking chills , " I could hardly hold the cursor to work with the computer " per pt.  Pt denied fever, denied nausea/vomiting.  Darden Dates, RN desk nurse notified.  OK for pt to see symptom management provider as per desk nurse. Spoke with pt and instructed pt to come in for labs and to see Jenny Reichmann, NP symptom management today.  Pt voiced understanding. Pt's  Work  Designer, industrial/product     [phone].

## 2015-11-05 NOTE — Progress Notes (Signed)
i placed form for [name] to sign

## 2015-11-05 NOTE — Patient Instructions (Signed)

## 2015-11-06 LAB — URINE CULTURE

## 2015-11-06 NOTE — Progress Notes (Signed)
[facility]    HEMATOLOGY/ONCOLOGY CONSULTATION NOTE  Date of Service: .11/01/2015  Patient Care Team: [name], MD as PCP - General (Family Medicine)  CHIEF COMPLAINTS/PURPOSE OF CONSULTATION:  Follow up for new diagnosis of multiple myeloma  DIAGNOSIS  IgA lambda R-ISS stage II multiple myeloma with innumerable lytic lesions in the calvarium, lesion in T7 vertebra and multiple lesions in the pelvis.  No significant bone pain at this time.  Planned treatment Vd for cycle 1 since insurance dealys with obtaining Revlimid VRd x 3-4 cycles after Vd Zometa q4weeks Consolidation with Autologous HSCT at [facility] treatment  Prophylactic medications - aspirin 325 mg by mouth daily. - Acyclovir 400 mg oral twice daily.  HISTORY OF PRESENTING ILLNESS: Please see my initial consultation for details of her initial presentation.  INTERVAL HISTORY  [facility] is here for her scheduled followup for MM. She is here for C1D8. Tolerated D1 well but had some fatigue and nausea and a little macular rash on her RLE extremity over anterior thigh that is nearly resolved now. No mucosal involvement. No lip /tongue or throat swelling. Notes some fatigue but has been trying to work full time. No fevers/chills no other acute new symptoms  She notes some fatigue from her anemia but no other acute new concerns. She understandably expresses some frustration with dealing with insurance company to acquire the Revlimid and is quite thankful to [place] our pharmacist for his help with this.   MEDICAL HISTORY:  Past Medical History  Diagnosis Date  . Prediabetes   . Fibroid     reason for Hysterectomy  . Urinary incontinence   . PONV (postoperative nausea and vomiting)     thinks morphine caused nausea  . Pain aggravated by activities of daily living     states she pulled something at right sternum in dec 2016  . Cancer ([place]) 1999    squamous cell carcinoma of thymus    SURGICAL HISTORY: Past Surgical History   Procedure Laterality Date  . Thymectomy  1999  . Abdominal hysterectomy  1997    TA[facility]--ovaries remain--Dr. Newton Pigg    SOCIAL HISTORY: Social History   Social History  . Marital Status: Widowed    Spouse Name: N/A  . Number of Children: N/A  . Years of Education: N/A   Occupational History  . Not on file.   Social History Main Topics  . Smoking status: Never Smoker   . Smokeless tobacco: Never Used  . Alcohol Use: No  . Drug Use: No  . Sexual Activity:    Partners: Male    Birth Control/ Protection: Surgical     Comment: TAH--ovaries remain   Other Topics Concern  . Not on file   Social History Narrative    FAMILY HISTORY: Family History  Problem Relation Age of Onset  . COPD Father     dec age 9  . Diabetes Brother   . Other Brother     committed suicide age 16  . Hyperlipidemia Mother   . Diabetes Maternal Grandmother     ALLERGIES:  is allergic to ampicillin.  MEDICATIONS:  Current Outpatient Prescriptions  Medication Sig Dispense Refill  . acyclovir (ZOVIRAX) 400 MG tablet Take 1 tablet (400 mg total) by mouth 2 (two) times daily. 60 tablet 3  . aspirin EC 325 MG tablet Take 1 tablet (325 mg total) by mouth daily. 60 tablet 3  . cetirizine (ZYRTEC) 5 MG tablet Take 5 mg by mouth daily.    [facility]  dexamethasone (DECADRON) 4 MG tablet Take 10 tablets (40 mg) on days 1, 8, and 15 of chemo. Repeat every 21 days. 30 tablet 3  . famotidine (PEPCID) 10 MG tablet Take 10 mg by mouth 2 (two) times daily.    [facility] lenalidomide (REVLIMID) 25 MG capsule Take 1 capsule (25 mg total) by mouth daily. Take 1 capsule on days 1-14. Repeat every 21 days. Authorization # [id number] (Valid for 30days) 14 capsule 3  . LORazepam (ATIVAN) 0.5 MG tablet Take 1 tablet (0.5 mg total) by mouth every 6 (six) hours as needed (Nausea or vomiting). 30 tablet 0  . ondansetron (ZOFRAN) 8 MG tablet Take 1 tablet (8 mg total) by mouth 2 (two) times daily as needed (Nausea or vomiting). 30 tablet  1  . prochlorperazine (COMPAZINE) 10 MG tablet Take 1 tablet (10 mg total) by mouth every 6 (six) hours as needed (Nausea or vomiting). 30 tablet 1   No current facility-administered medications for this visit.    REVIEW OF SYSTEMS:    10 Point review of Systems was done is negative except as noted above.  PHYSICAL EXAMINATION: ECOG PERFORMANCE STATUS: 1 - Symptomatic but completely ambulatory  . Filed Vitals:   11/01/15 1526  BP: 148/82  Pulse: 92  Temp: 98.3 F (36.8 C)  Resp: 18   Filed Weights   11/01/15 1526  Weight: 130 lb 11.2 oz (59.285 kg)   .Body mass index is 19.88 kg/(m^2).  GENERAL:alert, in no acute distress and comfortable SKIN: skin color, texture, turgor are normal, no rashes or significant lesions EYES: normal, conjunctiva are pink and non-injected, sclera clear OROPHARYNX:no exudate, no erythema and lips, buccal mucosa, and tongue normal  NECK: supple, no JVD, thyroid normal size, non-tender, without nodularity LYMPH:  no palpable lymphadenopathy in the cervical, axillary or inguinal LUNGS: clear to auscultation with normal respiratory effort HEART: regular rate & rhythm,  no murmurs and no lower extremity edema ABDOMEN: abdomen soft, non-tender, normoactive bowel sounds  Musculoskeletal: no cyanosis of digits and no clubbing  PSYCH: alert & oriented x 3 with fluent speech NEURO: no focal motor/sensory deficits  LABORATORY DATA:  I have reviewed the data as listed Outside labs from 10/19/2015 showed  CBC with a hemoglobin of 9.3, MCV 28.7, WBC count of 5.7k, platelets 366k CMP showed sodium of 136, potassium 4.3, creatinine 0.76, calcium 9.5, albumin 3.1, total protein 10.6. [facility] CBC Latest Ref Rng 11/01/2015  WBC 3.9 - 10.3 10e3/uL 4.7  Hemoglobin 11.6 - 15.9 g/dL 9.2(L)  Hematocrit 34.8 - 46.6 % 28.6(L)  Platelets 145 - 400 10e3/uL 276    . CMP Latest Ref Rng 11/01/2015  Glucose 70 - 140 mg/dl 179(H)  BUN 7.0 - 26.0 mg/dL 21.6  Creatinine 0.6 -  1.1 mg/dL 0.9  Sodium 136 - 145 mEq/L 139  Potassium 3.5 - 5.1 mEq/L 4.6  CO2 22 - 29 mEq/L 21(L)  Calcium 8.4 - 10.4 mg/dL 9.2  Total Protein 6.4 - 8.3 g/dL 12.0(H)  Total Bilirubin 0.20 - 1.20 mg/dL 0.31  Alkaline Phos 40 - 150 U/L 56  AST 5 - 34 U/L 18  ALT 0 - 55 U/L 23     Outside labs SPEP with 2 monoclonal bands 3.7 g/dL and the second band 1.2 g/dL (total 4.9 g/dl M protein). IFE Showed monoclonal IgA lambda protein.   LDH 141  Sedimentation rate 143 Beta-2 microglobulin 4.85 Albumin 3.5     CYTOGENETICS:  About 75% clone of plasma cells showing t(11:14) mutation  consistent with multiple myeloma on FISH  RADIOGRAPHIC STUDIES: I have personally reviewed the radiological images as listed and agreed with the findings in the report. Ct Abdomen Pelvis Wo Contrast  11/05/2015  CLINICAL DATA:  Gross hematuria. Fever and chills. Multiple myeloma. EXAM: CT ABDOMEN AND PELVIS WITHOUT CONTRAST TECHNIQUE: Multidetector CT imaging of the abdomen and pelvis was performed following the standard protocol without IV contrast. COMPARISON:  PET-CT on 10/12/2015 FINDINGS: Lower chest:  No acute findings. Hepatobiliary: No mass visualized on this un-enhanced exam. Gallbladder is unremarkable. Pancreas: No mass or inflammatory process identified on this un-enhanced exam. Spleen: Within normal limits in size. Adrenals/Urinary Tract: A 2 mm nonobstructive calculus is again seen in the upper pole of the left kidney. No right renal calculi identified. No evidence of ureteral calculi or hydronephrosis involving either kidney. No bladder calculi identified. Stomach/Bowel: No evidence of obstruction, inflammatory process, or abnormal fluid collections. Large colonic stool burden again noted. Vascular/Lymphatic: No pathologically enlarged lymph nodes. No evidence of abdominal aortic aneurysm. Reproductive: Prior hysterectomy again noted. Adnexal regions are unremarkable. Vaginal pessary again noted. Other:  None. Musculoskeletal: Multiple small lucent bone lesions again seen throughout the visualized skeleton, without significant change. This is consistent with patient's known multiple myeloma. No pathologic fractures identified. IMPRESSION: Tiny nonobstructive left renal calculus. No evidence of ureteral calculi, hydronephrosis, or other acute findings. Large stool burden again noted; suggest clinical correlation for possible constipation. No significant change and widespread small lytic bone lesions, consistent with known multiple myeloma. Electronically Signed   By: Earle Gell M.D.   On: 11/05/2015 14:57   Nm Pet Image Initial (pi) Whole Body  10/12/2015  CLINICAL DATA:  Initial treatment strategy for concern for multiple myeloma. Staging. History of malignant neoplasm of thymus. EXAM: NUCLEAR MEDICINE PET WHOLE BODY TECHNIQUE: 7.2 mCi F-18 FDG was injected intravenously. Full-ring PET imaging was performed from the vertex to the feet after the radiotracer. CT data was obtained and used for attenuation correction and anatomic localization. FASTING BLOOD GLUCOSE:  Value:  93 mg/dl COMPARISON:  Chest radiograph 09/29/2015. FINDINGS: HEAD/ NECK No areas of abnormal hypermetabolism. CHEST No extraosseous hypermetabolism identified. ABDOMEN/PELVIS No extraosseous hypermetabolism identified. SKELETON Hypermetabolism which corresponds to subtle fracture involving the lateral left fourth rib. This measures a S.U.V. max of 5.5, including on image 94/ series 4. No well-defined lytic lesion identified. Hypermetabolism within the sternum is favored to be postsurgical, status post median sternotomy. Equivocal heterogeneous activity involving the pelvis and thoracolumbar spine. There is paucity of activity involving the upper thoracic spine, favored to be due to prior radiation. CT IMAGES PERFORMED FOR ATTENUATION CORRECTION Innumerable lytic lesions throughout the calvarium, including on image 10/series 4. No cervical  adenopathy. Left greater than right carotid atherosclerosis. Mild cardiomegaly. Vague increased density in the left upper lobe is favored to represent subsegmental atelectasis. Punctate upper pole left renal collecting system calculus. Hysterectomy. Pessary in place. Heterogeneous marrow density throughout. Innumerable small lytic lesions including within the iliac bones bilaterally. Right-sided vertebral body lesion at approximately T7 is relatively ill-defined, including on image 89/ series 4. Approximately 13 mm. IMPRESSION: 1. Extensive heterogeneous marrow density with innumerable lytic lesions, especially in the calvarium. Findings are consistent with multiple myeloma. Disease is relatively PET occutl. A left lateral fourth rib fracture is hypermetabolic and could be pathologic. Heterogeneous activity within the pelvis and spine is indeterminate. 2.   No evidence of extraosseous myeloma. Electronically Signed   By: Abigail Miyamoto M.D.   On: 10/12/2015 16:02  Ct Biopsy  10/13/2015  CLINICAL DATA:  Anemia, plasma cell dyscrasia, concern for myeloma EXAM: CT GUIDED RIGHT ILIAC BONE MARROW ASPIRATION AND CORE BIOPSY Date:  1/18/20171/18/2017 9:34 am Radiologist:  M. Daryll Brod, MD Guidance:  CT FLUOROSCOPY TIME:  NONE. MEDICATIONS AND MEDICAL HISTORY: 2 mg Versed, 75 mcg fentanyl ANESTHESIA/SEDATION: 10 minutes, the patient's level of consciousness and physiological status was monitored by radiology nursing. CONTRAST:  None. COMPLICATIONS: None immediate PROCEDURE: Informed consent was obtained from the patient following explanation of the procedure, risks, benefits and alternatives. The patient understands, agrees and consents for the procedure. All questions were addressed. A time out was performed. The patient was positioned prone and noncontrast localization CT was performed of the pelvis to demonstrate the iliac marrow spaces. Maximal barrier sterile technique utilized including caps, mask, sterile gowns,  sterile gloves, large sterile drape, hand hygiene, and betadine prep. Under sterile conditions and local anesthesia, an 11 gauge coaxial bone biopsy needle was advanced into the right iliac marrow space. Needle position was confirmed with CT imaging. Initially, bone marrow aspiration was performed. Next, the 11 gauge outer cannula was utilized to obtain a right iliac bone marrow core biopsy. Needle was removed. Hemostasis was obtained with compression. The patient tolerated the procedure well. Samples were prepared with the cytotechnologist. No immediate complications. IMPRESSION: CT guided right iliac bone marrow aspiration and core biopsy. Electronically Signed   By: Jerilynn Mages.  Shick M.D.   On: 10/13/2015 10:00    ASSESSMENT & PLAN:    Very pleasant 60 year old Caucasian female with  1) newly diagnosed R-ISS Stage II IgA lambda multiple myeloma with standard risk cytogenetics t (11;14). PET/CT shows evidence of lytic bone lesions in the calvarium, iliac bones  and T7. No hypercalcemia or renal failure at this time. Pretreatment M spike of 4.9 in 2 monoclonal bands. BM bx with 69% plasma cells with Lambda light chain restriction  #2 anemia due to multiple myeloma #3 fatigue due to multiple myeloma and Anemia #4 multiple bone metastases from multiple myeloma. #5 Mild RLE macular rash likely from Velcade - resolving. Plan -patient received 1st cycle with Vd alone with twice weekly Velcade. -will add Revlimid from cycle 2 once available. Would avoid starting it mid-cycle. --Zometa every 4 weekly for bone metastases. -might consider splitting Dexamethasone dose if intolerance to D4 and D11 Velcade. -Aspirin daily for VTE prophylaxis with Revlimid -acyclovir for shingles prophylaxis ( patient notes that she did get a shingles vaccine about 2 years ago). -Would plan to do 4 cycles of VRd and then consider consolidation with an [employer] HSCT. -counseled on need for good hydration and focus on good po  intake. -OTC Cetrizine and Famotidine for rash. May use topical hydrocortisone for localized rash. She was educated to call us immediately if the rash is worsening.  RTC with Dr Irene Limbo in 2 weeks for continued followup.  I spent 20 minutes counseling the patient face to face. The total time spent in the appointment was 50 minutes and more than 50% was on counseling and direct patient cares.    Sullivan Lone MD [place] AAHIVMS [facility] Hematology/Oncology Physician [facility]  (Office):       [phone] (Work cell):  [phone] (Fax):           [phone]

## 2015-11-08 ENCOUNTER — Telehealth: Payer: Self-pay | Admitting: *Deleted

## 2015-11-08 ENCOUNTER — Other Ambulatory Visit: Payer: Self-pay | Admitting: *Deleted

## 2015-11-08 ENCOUNTER — Other Ambulatory Visit (HOSPITAL_BASED_OUTPATIENT_CLINIC_OR_DEPARTMENT_OTHER): Payer: BLUE CROSS/BLUE SHIELD

## 2015-11-08 ENCOUNTER — Encounter: Payer: Self-pay | Admitting: Nurse Practitioner

## 2015-11-08 ENCOUNTER — Encounter: Payer: Self-pay | Admitting: Hematology

## 2015-11-08 DIAGNOSIS — C9000 Multiple myeloma not having achieved remission: Secondary | ICD-10-CM | POA: Diagnosis not present

## 2015-11-08 DIAGNOSIS — R319 Hematuria, unspecified: Secondary | ICD-10-CM

## 2015-11-08 HISTORY — DX: Hematuria, unspecified: R31.9

## 2015-11-08 LAB — COMPREHENSIVE METABOLIC PANEL
ALT: 26 U/L (ref 0–55)
AST: 16 U/L (ref 5–34)
Albumin: 2.9 g/dL — ABNORMAL LOW (ref 3.5–5.0)
Alkaline Phosphatase: 55 U/L (ref 40–150)
Anion Gap: 14 mEq/L — ABNORMAL HIGH (ref 3–11)
BUN: 24.4 mg/dL (ref 7.0–26.0)
CHLORIDE: 103 meq/L (ref 98–109)
CO2: 20 meq/L — AB (ref 22–29)
Calcium: 9.5 mg/dL (ref 8.4–10.4)
Creatinine: 0.9 mg/dL (ref 0.6–1.1)
EGFR: 72 mL/min/{1.73_m2} — AB (ref >90)
GLUCOSE: 142 mg/dL — AB (ref 70–140)
POTASSIUM: 4.6 meq/L (ref 3.5–5.1)
SODIUM: 137 meq/L (ref 136–145)
Total Bilirubin: 0.46 mg/dL (ref 0.20–1.20)
Total Protein: 10.6 g/dL — ABNORMAL HIGH (ref 6.4–8.3)

## 2015-11-08 LAB — CBC WITH DIFFERENTIAL/PLATELET
BASO%: 0.1 % (ref 0.0–2.0)
BASOS ABS: 0 10*3/uL (ref 0.0–0.1)
EOS ABS: 0 10*3/uL (ref 0.0–0.5)
EOS%: 0.1 % (ref 0.0–7.0)
HCT: 27.6 % — ABNORMAL LOW (ref 34.8–46.6)
HGB: 9 g/dL — ABNORMAL LOW (ref 11.6–15.9)
LYMPH%: 12.1 % — AB (ref 14.0–49.7)
MCH: 29.4 pg (ref 25.1–34.0)
MCHC: 32.5 g/dL (ref 31.5–36.0)
MCV: 90.4 fL (ref 79.5–101.0)
MONO#: 0.1 10*3/uL (ref 0.1–0.9)
MONO%: 2.7 % (ref 0.0–14.0)
NEUT#: 3.3 10*3/uL (ref 1.5–6.5)
NEUT%: 85 % — ABNORMAL HIGH (ref 38.4–76.8)
Platelets: 215 10*3/uL (ref 145–400)
RBC: 3.05 10*6/uL — AB (ref 3.70–5.45)
RDW: 17 % — ABNORMAL HIGH (ref 11.2–14.5)
WBC: 3.9 10*3/uL (ref 3.9–10.3)
lymph#: 0.5 10*3/uL — ABNORMAL LOW (ref 0.9–3.3)

## 2015-11-08 LAB — INFLUENZA A AND B
Influenza A Ag, EIA: NEGATIVE
Influenza B Ag, EIA: NEGATIVE

## 2015-11-08 NOTE — Assessment & Plan Note (Signed)
Patient has a scattered, pink/red rash to her lower abdomen and to the tops of her thighs.  She states it is only occasionally pruritic.  She has been taking Benadryl intermittently for treatment of the rash.  It does appear that the rash is a drug rash; most likely secondary to the Velcade injections.  Will continue to monitor closely.  Patient was advised that she may try Benadryl 25 mg every 6 hours and Pepcid 20 mg every 12 hours.

## 2015-11-08 NOTE — Progress Notes (Signed)
Faxed [employer] [phone] and leaving copy for patient to pk up today.

## 2015-11-08 NOTE — Assessment & Plan Note (Signed)
Patient received cycle 1, day 11 of her Velcade injection just just today, 11/04/2015.  She received her last Zometa infusion on 10/25/2015.  Blood counts obtained today revealed a WBC of 7.7, ANC 6.5, hemoglobin has improved from 8.1 up to 8.8, and platelet count is 245.  Patient is scheduled to return for labs only on 11/08/2015.  She is scheduled for labs, visit, and her next Velcade injection on 11/15/2015.

## 2015-11-08 NOTE — Telephone Encounter (Signed)
Called pt to follow up after visit to [facility] clinic on Friday.  Pt states she is feeling well and has not had any additional fever over the weekend.  Informed pt that blood/urine cultures are negative to date.  Pt appreciative for call.

## 2015-11-08 NOTE — Assessment & Plan Note (Addendum)
Patient states that she has developed fever as high as 103.0 within the past 24-48 hours.  She has also had some chills.  She denies any URI, UTI, or GI upset symptoms.  She feels very tired and generally weak.  She has taken intermittent Tylenol with only minimal effectiveness.  On presentation to the [facility].  Patient's heart rate was 117 and her temperature is 103.0.  Labs obtained reveal a white count of 7.7.  O2 sat was 99% on room air.  Urinalysis obtained today revealed gross hematuria; but no symptoms of infection.  Lung sounds clear bilaterally; and patient appears nontoxic.  Currently awaiting both blood cultures 2 and urine cultures.  Also, stopped patient for influenza; which was negative.  Patient was given Tylenol; and temperature came down to 1-1.3.  Patient appeared to feel much better once her fever went down.  [name] in; and had a long discussion with the patient and her family member.  Pending results of the urine and blood cultures-and negative influenza swabbing; elevated fevers post Velcade injections could be a side effect of the patient's Velcade.  [name] .  Advised patient that she may very well need to take dexamethasone for a little longer following each Velcade injections for better tolerance.  Patient was advised to go directly to the emergency department for any worsening symptoms whatsoever.

## 2015-11-08 NOTE — Progress Notes (Signed)
SYMPTOM MANAGEMENT CLINIC   HPI: Paula Johnson 60 y.o. female diagnosed with multiple myeloma with bone metastasis.  Currently undergoing Velcade and Zometa therapy.   Patient states that she has developed fever as high as 103.0 within the past 24-48 hours.  She has also had some chills.  She denies any URI, UTI, or GI upset symptoms.  She feels very tired and generally weak.  She has taken intermittent Tylenol with only minimal effectiveness.  On presentation to the [place].  Patient's heart rate was 117 and her temperature is 103.0.  Labs obtained reveal a white count of 7.7.  O2 sat was 99% on room air.  Urinalysis obtained today revealed gross hematuria; but no symptoms of infection.  Lung sounds clear bilaterally; and patient appears nontoxic.  Currently awaiting both blood cultures 2 and urine cultures.  Also, stopped patient for influenza; which was negative.  Patient was given Tylenol; and temperature came down to 1-1.3.  Patient appeared to feel much better once her fever went down.  [name] in; and had a long discussion with the patient and her family member.  Pending results of the urine and blood cultures-and negative influenza swabbing; elevated fevers post Velcade injections could be a side effect of the patient's Velcade.  [name] .  Advised patient that she may very well need to take dexamethasone for a little longer following each Velcade injections for better tolerance.  Patient was advised to go directly to the emergency department for any worsening symptoms whatsoever.   HPI  Review of Systems  Constitutional: Positive for fever, chills and malaise/fatigue.  Skin: Positive for itching and rash.  Neurological: Positive for weakness.  All other systems reviewed and are negative.   Past Medical History  Diagnosis Date  . Prediabetes   . Fibroid     reason for Hysterectomy  . Urinary incontinence   . PONV (postoperative nausea and vomiting)    thinks morphine caused nausea  . Pain aggravated by activities of daily living     states she pulled something at right sternum in dec 2016  . Cancer ([place]) 1999    squamous cell carcinoma of thymus    Past Surgical History  Procedure Laterality Date  . Thymectomy  1999  . Abdominal hysterectomy  1997    TAH--ovaries remain--Dr. Newton Pigg    has MALIGNANT NEOPLASM OF THYMUS; HYPERLIPIDEMIA; OBSTRUCTIVE SLEEP APNEA; RESTLESS LEGS SYNDROME; DYSPNEA; HYPOXEMIA; Anemia; Hypergammaglobulinemia; Plasma cell dyscrasia; Absolute anemia; Multiple myeloma ([place]); Metastatic multiple myeloma to bone ([place]); Rash; Fever; and Hematuria on her problem list.    is allergic to ampicillin.    Medication List       This list is accurate as of: 11/05/15 11:59 PM.  Always use your most recent med list.               acyclovir 400 MG tablet  Commonly known as:  ZOVIRAX  Take 1 tablet (400 mg total) by mouth 2 (two) times daily.     aspirin EC 325 MG tablet  Take 1 tablet (325 mg total) by mouth daily.     cetirizine 5 MG tablet  Commonly known as:  ZYRTEC  Take 5 mg by mouth daily.     dexamethasone 4 MG tablet  Commonly known as:  DECADRON  Take 10 tablets (40 mg) on days 1, 8, and 15 of chemo. Repeat every 21 days.     famotidine 10 MG tablet  Commonly known as:  PEPCID  Take 10 mg by mouth 2 (two) times daily.     lenalidomide 25 MG capsule  Commonly known as:  REVLIMID  Take 1 capsule (25 mg total) by mouth daily. Take 1 capsule on days 1-14. Repeat every 21 days. Authorization # [id number] (Valid for 30days)     LORazepam 0.5 MG tablet  Commonly known as:  ATIVAN  Take 1 tablet (0.5 mg total) by mouth every 6 (six) hours as needed (Nausea or vomiting).     ondansetron 8 MG tablet  Commonly known as:  ZOFRAN  Take 1 tablet (8 mg total) by mouth 2 (two) times daily as needed (Nausea or vomiting).     prochlorperazine 10 MG tablet  Commonly known as:  COMPAZINE  Take 1 tablet  (10 mg total) by mouth every 6 (six) hours as needed (Nausea or vomiting).         PHYSICAL EXAMINATION  Oncology Vitals 11/05/2015 11/05/2015  Height - -  Weight - 61.281 kg  Weight (lbs) - 135 lbs 2 oz  BMI (kg/m2) - -  Temp 101.4 103  Pulse - 117  Resp - 20  SpO2 - 99  BSA (m2) - -   BP Readings from Last 2 Encounters:  11/05/15 155/74  11/04/15 152/92    Physical Exam  Constitutional: She is oriented to person, place, and time. She appears malnourished. She appears unhealthy. She appears cachectic.  HENT:  Head: Normocephalic and atraumatic.  Mouth/Throat: Oropharynx is clear and moist.  Eyes: Conjunctivae and EOM are normal. Pupils are equal, round, and reactive to light. Right eye exhibits no discharge. Left eye exhibits no discharge. No scleral icterus.  Neck: Normal range of motion. Neck supple. No JVD present. No tracheal deviation present. No thyromegaly present.  Cardiovascular: Regular rhythm, normal heart sounds and intact distal pulses.   Pulmonary/Chest: Effort normal and breath sounds normal. No respiratory distress. She has no wheezes. She has no rales. She exhibits no tenderness.  Abdominal: Soft. Bowel sounds are normal. She exhibits no distension and no mass. There is no tenderness. There is no rebound and no guarding.  Musculoskeletal: Normal range of motion. She exhibits no edema or tenderness.  Lymphadenopathy:    She has no cervical adenopathy.  Neurological: She is alert and oriented to person, place, and time. Gait normal.  Skin: Skin is warm and dry. Rash noted. No erythema. There is pallor.  Rash to lower abdomen and thighs.  Psychiatric: Affect normal.    LABORATORY DATA:. Appointment on 11/05/2015  Component Date Value Ref Range Status  . WBC 11/05/2015 7.7  3.9 - 10.3 10e3/uL Final  . NEUT# 11/05/2015 6.5  1.5 - 6.5 10e3/uL Final  . HGB 11/05/2015 8.8* 11.6 - 15.9 g/dL Final  . HCT 11/05/2015 26.8* 34.8 - 46.6 % Final  . Platelets  11/05/2015 245  145 - 400 10e3/uL Final  . MCV 11/05/2015 91.2  79.5 - 101.0 fL Final  . MCH 11/05/2015 29.9  25.1 - 34.0 pg Final  . MCHC 11/05/2015 32.8  31.5 - 36.0 g/dL Final  . RBC 11/05/2015 2.94* 3.70 - 5.45 10e6/uL Final  . RDW 11/05/2015 17.1* 11.2 - 14.5 % Final  . lymph# 11/05/2015 0.7* 0.9 - 3.3 10e3/uL Final  . MONO# 11/05/2015 0.4  0.1 - 0.9 10e3/uL Final  . Eosinophils Absolute 11/05/2015 0.0  0.0 - 0.5 10e3/uL Final  . Basophils Absolute 11/05/2015 0.0  0.0 - 0.1 10e3/uL Final  . NEUT% 11/05/2015 85.0* 38.4 - 76.8 %  Final  . LYMPH% 11/05/2015 8.7* 14.0 - 49.7 % Final  . MONO% 11/05/2015 5.7  0.0 - 14.0 % Final  . EOS% 11/05/2015 0.4  0.0 - 7.0 % Final  . BASO% 11/05/2015 0.2  0.0 - 2.0 % Final  . Sodium 11/05/2015 139  136 - 145 mEq/L Final  . Potassium 11/05/2015 3.9  3.5 - 5.1 mEq/L Final  . Chloride 11/05/2015 104  98 - 109 mEq/L Final  . CO2 11/05/2015 19* 22 - 29 mEq/L Final  . Glucose 11/05/2015 88  70 - 140 mg/dl Final   Glucose reference range is for nonfasting patients. Fasting glucose reference range is 70- 100.  Marland Kitchen BUN 11/05/2015 19.6  7.0 - 26.0 mg/dL Final  . Creatinine 11/05/2015 0.8  0.6 - 1.1 mg/dL Final  . Total Bilirubin 11/05/2015 0.36  0.20 - 1.20 mg/dL Final  . Alkaline Phosphatase 11/05/2015 57  40 - 150 U/L Final  . AST 11/05/2015 25  5 - 34 U/L Final  . ALT 11/05/2015 26  0 - 55 U/L Final  . Total Protein 11/05/2015 11.1* 6.4 - 8.3 g/dL Final  . Albumin 11/05/2015 2.8* 3.5 - 5.0 g/dL Final  . Calcium 11/05/2015 9.0  8.4 - 10.4 mg/dL Final  . Anion Gap 11/05/2015 15* 3 - 11 mEq/L Final  . EGFR 11/05/2015 75* >90 ml/min/1.73 m2 Final   eGFR is calculated using the CKD-EPI Creatinine Equation (2009)  . Glucose 11/05/2015 Negative  Negative mg/dL Final  . Bilirubin (Urine) 11/05/2015 Negative  Negative Final  . Ketones 11/05/2015 Negative  Negative mg/dL Final  . Specific Gravity, Urine 11/05/2015 1.020  1.003 - 1.035 Final  . Blood 11/05/2015  Large  Negative Final  . pH 11/05/2015 5.0  4.6 - 8.0 Final  . Protein 11/05/2015 100  Negative- <30 mg/dL Final  . Urobilinogen, UR 11/05/2015 0.2  0.2 - 1 mg/dL Final  . Nitrite 11/05/2015 Negative  Negative Final  . Leukocyte Esterase 11/05/2015 Negative  Negative Final  . RBC / HPF 11/05/2015 3-6  0 - 2 Final  . WBC, UA 11/05/2015 0-2  0 - 2 Final  . Epithelial Cells 11/05/2015 Few  Negative- Few Final  . Urine Culture, Routine 11/05/2015 Final report   Final  . Urine Culture result 1 11/05/2015 Comment   Final   Comment: Mixed urogenital flora Less than 10,000 colonies/mL   . BLOOD CULTURE, ROUTINE 11/05/2015 Preliminary report   Preliminary  . RESULT 1 11/05/2015 Comment   Preliminary   No growth in 36 - 48 hours.  Marland Kitchen BLOOD CULTURE, ROUTINE 11/05/2015 Preliminary report   Preliminary  . RESULT 1 11/05/2015 Comment   Preliminary   No growth in 36 - 48 hours.  . Influenza A Ag, EIA 11/05/2015 Negative  Negative Final  . Influenza B Ag, EIA 11/05/2015 Negative  Negative Final  . Influenza Comment 11/05/2015 See note   Final  . Please note: 11/05/2015 Comment   Final   Comment: Because a negative rapid influenza antigen EIA test may not rule-out influenza viral infection, the CDC and other public health agencies have recommended that confirmatory testing using viral culture or nucleic acid amplification should be considered when clinical signs and symptoms are consistent with influenza-like illness, as well as to assist in detecting other respiratory viruses that can produce similar clinical symptoms. (Reference: https://lee-mcguire.com/ guidance_ridt.pdf)   Appointment on 11/04/2015  Component Date Value Ref Range Status  . WBC 11/04/2015 4.1  3.9 - 10.3 10e3/uL Final  .  NEUT# 11/04/2015 1.8  1.5 - 6.5 10e3/uL Final  . HGB 11/04/2015 8.1* 11.6 - 15.9 g/dL Final  . HCT 11/04/2015 24.5* 34.8 - 46.6 % Final  . Platelets 11/04/2015 229  145 - 400  10e3/uL Final  . MCV 11/04/2015 90.9  79.5 - 101.0 fL Final  . MCH 11/04/2015 29.9  25.1 - 34.0 pg Final  . MCHC 11/04/2015 32.9  31.5 - 36.0 g/dL Final  . RBC 11/04/2015 2.70* 3.70 - 5.45 10e6/uL Final  . RDW 11/04/2015 17.1* 11.2 - 14.5 % Final  . lymph# 11/04/2015 1.5  0.9 - 3.3 10e3/uL Final  . MONO# 11/04/2015 0.7  0.1 - 0.9 10e3/uL Final  . Eosinophils Absolute 11/04/2015 0.1  0.0 - 0.5 10e3/uL Final  . Basophils Absolute 11/04/2015 0.0  0.0 - 0.1 10e3/uL Final  . NEUT% 11/04/2015 43.0  38.4 - 76.8 % Final  . LYMPH% 11/04/2015 36.5  14.0 - 49.7 % Final  . MONO% 11/04/2015 17.9* 0.0 - 14.0 % Final  . EOS% 11/04/2015 1.4  0.0 - 7.0 % Final  . BASO% 11/04/2015 1.2  0.0 - 2.0 % Final  . Sodium 11/04/2015 140  136 - 145 mEq/L Final  . Potassium 11/04/2015 3.9  3.5 - 5.1 mEq/L Final  . Chloride 11/04/2015 104  98 - 109 mEq/L Final  . CO2 11/04/2015 23  22 - 29 mEq/L Final  . Glucose 11/04/2015 104  70 - 140 mg/dl Final   Glucose reference range is for nonfasting patients. Fasting glucose reference range is 70- 100.  Marland Kitchen BUN 11/04/2015 18.6  7.0 - 26.0 mg/dL Final  . Creatinine 11/04/2015 0.9  0.6 - 1.1 mg/dL Final  . Total Bilirubin 11/04/2015 0.38  0.20 - 1.20 mg/dL Final  . Alkaline Phosphatase 11/04/2015 58  40 - 150 U/L Final  . AST 11/04/2015 19  5 - 34 U/L Final  . ALT 11/04/2015 25  0 - 55 U/L Final  . Total Protein 11/04/2015 10.9* 6.4 - 8.3 g/dL Final  . Albumin 11/04/2015 2.7* 3.5 - 5.0 g/dL Final  . Calcium 11/04/2015 9.1  8.4 - 10.4 mg/dL Final  . Anion Gap 11/04/2015 14* 3 - 11 mEq/L Final  . EGFR 11/04/2015 74* >90 ml/min/1.73 m2 Final   eGFR is calculated using the CKD-EPI Creatinine Equation (2009)     RADIOGRAPHIC STUDIES: Ct Abdomen Pelvis Wo Contrast  11/05/2015  CLINICAL DATA:  Gross hematuria. Fever and chills. Multiple myeloma. EXAM: CT ABDOMEN AND PELVIS WITHOUT CONTRAST TECHNIQUE: Multidetector CT imaging of the abdomen and pelvis was performed following the  standard protocol without IV contrast. COMPARISON:  PET-CT on 10/12/2015 FINDINGS: Lower chest:  No acute findings. Hepatobiliary: No mass visualized on this un-enhanced exam. Gallbladder is unremarkable. Pancreas: No mass or inflammatory process identified on this un-enhanced exam. Spleen: Within normal limits in size. Adrenals/Urinary Tract: A 2 mm nonobstructive calculus is again seen in the upper pole of the left kidney. No right renal calculi identified. No evidence of ureteral calculi or hydronephrosis involving either kidney. No bladder calculi identified. Stomach/Bowel: No evidence of obstruction, inflammatory process, or abnormal fluid collections. Large colonic stool burden again noted. Vascular/Lymphatic: No pathologically enlarged lymph nodes. No evidence of abdominal aortic aneurysm. Reproductive: Prior hysterectomy again noted. Adnexal regions are unremarkable. Vaginal pessary again noted. Other: None. Musculoskeletal: Multiple small lucent bone lesions again seen throughout the visualized skeleton, without significant change. This is consistent with patient's known multiple myeloma. No pathologic fractures identified. IMPRESSION: Tiny nonobstructive left renal calculus.  No evidence of ureteral calculi, hydronephrosis, or other acute findings. Large stool burden again noted; suggest clinical correlation for possible constipation. No significant change and widespread small lytic bone lesions, consistent with known multiple myeloma. Electronically Signed   By: Earle Gell M.D.   On: 11/05/2015 14:57    ASSESSMENT/PLAN:    Rash Patient has a scattered, pink/red rash to her lower abdomen and to the tops of her thighs.  She states it is only occasionally pruritic.  She has been taking Benadryl intermittently for treatment of the rash.  It does appear that the rash is a drug rash; most likely secondary to the Velcade injections.  Will continue to monitor closely.  Patient was advised that she may  try Benadryl 25 mg every 6 hours and Pepcid 20 mg every 12 hours.  Multiple myeloma ([place]) Patient received cycle 1, day 11 of her Velcade injection just just today, 11/04/2015.  She received her last Zometa infusion on 10/25/2015.  Blood counts obtained today revealed a WBC of 7.7, ANC 6.5, hemoglobin has improved from 8.1 up to 8.8, and platelet count is 245.  Patient is scheduled to return for labs only on 11/08/2015.  She is scheduled for labs, visit, and her next Velcade injection on 11/15/2015.  Fever Patient states that she has developed fever as high as 103.0 within the past 24-48 hours.  She has also had some chills.  She denies any URI, UTI, or GI upset symptoms.  She feels very tired and generally weak.  She has taken intermittent Tylenol with only minimal effectiveness.  On presentation to the [facility].  Patient's heart rate was 117 and her temperature is 103.0.  Labs obtained reveal a white count of 7.7.  O2 sat was 99% on room air.  Urinalysis obtained today revealed gross hematuria; but no symptoms of infection.  Lung sounds clear bilaterally; and patient appears nontoxic.  Currently awaiting both blood cultures 2 and urine cultures.  Also, stopped patient for influenza; which was negative.  Patient was given Tylenol; and temperature came down to 1-1.3.  Patient appeared to feel much better once her fever went down.  [name] in; and had a long discussion with the patient and her family member.  Pending results of the urine and blood cultures-and negative influenza swabbing; elevated fevers post Velcade injections could be a side effect of the patient's Velcade.  [name] .  Advised patient that she may very well need to take dexamethasone for a little longer following each Velcade injections for better tolerance.  Patient was advised to go directly to the emergency department for any worsening symptoms whatsoever.   Hematuria Urinalysis was completely normal; with the  exception of gross hematuria.  Patient denies any UTI symptoms whatsoever.  However, she does have a fever that was elevated at 103.0 earlier today.  Pending urine culture results at present time.  Did obtain a CT without contrast of the abdomen/pelvis stone study; which revealed: IMPRESSION: Tiny nonobstructive left renal calculus. No evidence of ureteral calculi, hydronephrosis, or other acute findings.  Large stool burden again noted; suggest clinical correlation for possible constipation.  No significant change and widespread small lytic bone lesions, consistent with known multiple myeloma.  Advised patient that CT findings revealed a small nonobstructive stone only; which could be associated with hematuria.  Will continue to monitor closely.    Patient stated understanding of all instructions; and was in agreement with this plan of care. The patient knows to call  the clinic with any problems, questions or concerns.   This was a shared visit with [name] today.   Total time spent with patient was 40 minutes;  with greater than 75 percent of that time spent in face to face counseling regarding patient's symptoms,  and coordination of care and follow up.  Disclaimer:This dictation was prepared with Dragon/digital dictation along with [employer]. Any transcriptional errors that result from this process are unintentional.  Drue Second, NP 11/08/2015     Addendum  Patient was personally seen and examined and discussed in details with Selena Lesser and all relevant labs reviewed. Above documentation reflected our combined assessment and plan.  Sullivan Lone MD MS

## 2015-11-08 NOTE — Assessment & Plan Note (Signed)
Urinalysis was completely normal; with the exception of gross hematuria.  Patient denies any UTI symptoms whatsoever.  However, she does have a fever that was elevated at 103.0 earlier today.  Pending urine culture results at present time.  Did obtain a CT without contrast of the abdomen/pelvis stone study; which revealed: IMPRESSION: Tiny nonobstructive left renal calculus. No evidence of ureteral calculi, hydronephrosis, or other acute findings.  Large stool burden again noted; suggest clinical correlation for possible constipation.  No significant change and widespread small lytic bone lesions, consistent with known multiple myeloma.  Advised patient that CT findings revealed a small nonobstructive stone only; which could be associated with hematuria.  Will continue to monitor closely.

## 2015-11-08 NOTE — Telephone Encounter (Signed)
LVM with patient instructing her to take 20mg  Dexamethasone on day 1,4,8,11 of chemo cycle instead of the 40mg  dexamethasone previous prescribed.  Instructed patient to call for any questions.  Will see patient in office on first day of chemo to reiterate directions.

## 2015-11-10 ENCOUNTER — Telehealth: Payer: Self-pay | Admitting: *Deleted

## 2015-11-10 NOTE — Telephone Encounter (Signed)
Per staff message patient request later appt times. I have moved appts and called patient with appts

## 2015-11-11 LAB — CULTURE, BLOOD (SINGLE)

## 2015-11-15 ENCOUNTER — Ambulatory Visit (HOSPITAL_BASED_OUTPATIENT_CLINIC_OR_DEPARTMENT_OTHER): Payer: BLUE CROSS/BLUE SHIELD

## 2015-11-15 ENCOUNTER — Ambulatory Visit (HOSPITAL_BASED_OUTPATIENT_CLINIC_OR_DEPARTMENT_OTHER): Payer: BLUE CROSS/BLUE SHIELD | Admitting: Hematology

## 2015-11-15 ENCOUNTER — Other Ambulatory Visit (HOSPITAL_BASED_OUTPATIENT_CLINIC_OR_DEPARTMENT_OTHER): Payer: BLUE CROSS/BLUE SHIELD

## 2015-11-15 ENCOUNTER — Encounter: Payer: Self-pay | Admitting: Hematology

## 2015-11-15 VITALS — BP 127/78 | HR 104 | Temp 97.7°F | Resp 18 | Ht 68.0 in | Wt 131.9 lb

## 2015-11-15 DIAGNOSIS — C9000 Multiple myeloma not having achieved remission: Secondary | ICD-10-CM

## 2015-11-15 DIAGNOSIS — D630 Anemia in neoplastic disease: Secondary | ICD-10-CM

## 2015-11-15 DIAGNOSIS — Z5112 Encounter for antineoplastic immunotherapy: Secondary | ICD-10-CM | POA: Diagnosis not present

## 2015-11-15 LAB — COMPREHENSIVE METABOLIC PANEL
ALBUMIN: 3.1 g/dL — AB (ref 3.5–5.0)
ALK PHOS: 82 U/L (ref 40–150)
ALT: 14 U/L (ref 0–55)
ANION GAP: 11 meq/L (ref 3–11)
AST: 14 U/L (ref 5–34)
BUN: 21.9 mg/dL (ref 7.0–26.0)
CALCIUM: 9.3 mg/dL (ref 8.4–10.4)
CHLORIDE: 104 meq/L (ref 98–109)
CO2: 23 mEq/L (ref 22–29)
Creatinine: 1.1 mg/dL (ref 0.6–1.1)
EGFR: 53 mL/min/{1.73_m2} — AB (ref >90)
Glucose: 143 mg/dl — ABNORMAL HIGH (ref 70–140)
POTASSIUM: 4.8 meq/L (ref 3.5–5.1)
Sodium: 138 mEq/L (ref 136–145)
Total Bilirubin: 0.36 mg/dL (ref 0.20–1.20)
Total Protein: 10.7 g/dL — ABNORMAL HIGH (ref 6.4–8.3)

## 2015-11-15 LAB — CBC WITH DIFFERENTIAL/PLATELET
BASO%: 0 % (ref 0.0–2.0)
BASOS ABS: 0 10*3/uL (ref 0.0–0.1)
EOS ABS: 0 10*3/uL (ref 0.0–0.5)
EOS%: 0 % (ref 0.0–7.0)
HEMATOCRIT: 28.3 % — AB (ref 34.8–46.6)
HEMOGLOBIN: 9.2 g/dL — AB (ref 11.6–15.9)
LYMPH#: 0.7 10*3/uL — AB (ref 0.9–3.3)
LYMPH%: 10.3 % — ABNORMAL LOW (ref 14.0–49.7)
MCH: 30.3 pg (ref 25.1–34.0)
MCHC: 32.5 g/dL (ref 31.5–36.0)
MCV: 93.1 fL (ref 79.5–101.0)
MONO#: 0.2 10*3/uL (ref 0.1–0.9)
MONO%: 2.6 % (ref 0.0–14.0)
NEUT#: 5.6 10*3/uL (ref 1.5–6.5)
NEUT%: 87.1 % — AB (ref 38.4–76.8)
PLATELETS: 416 10*3/uL — AB (ref 145–400)
RBC: 3.04 10*6/uL — ABNORMAL LOW (ref 3.70–5.45)
RDW: 17.4 % — AB (ref 11.2–14.5)
WBC: 6.4 10*3/uL (ref 3.9–10.3)

## 2015-11-15 MED ORDER — PROCHLORPERAZINE MALEATE 10 MG PO TABS
ORAL_TABLET | ORAL | Status: AC
  Filled 2015-11-15: qty 1

## 2015-11-15 MED ORDER — BORTEZOMIB CHEMO SQ INJECTION 3.5 MG (2.5MG/ML)
1.3000 mg/m2 | Freq: Once | INTRAMUSCULAR | Status: AC
  Administered 2015-11-15: 2.25 mg via SUBCUTANEOUS
  Filled 2015-11-15: qty 2.25

## 2015-11-15 MED ORDER — SENNOSIDES-DOCUSATE SODIUM 8.6-50 MG PO TABS: 2.0000 | ORAL_TABLET | Freq: Every day | ORAL | Status: DC

## 2015-11-15 MED ORDER — PROCHLORPERAZINE MALEATE 10 MG PO TABS
10.0000 mg | ORAL_TABLET | Freq: Once | ORAL | Status: AC
  Administered 2015-11-15: 10 mg via ORAL

## 2015-11-15 NOTE — Patient Instructions (Signed)
[facility] Discharge Instructions for Patients Receiving Chemotherapy  Today you received the following chemotherapy agents: Velcade.  To help prevent nausea and vomiting after your treatment, we encourage you to take your nausea medication: Compazine 10 mg every 6 hours as needed, Zofran 8 mg every 12 hours as needed.   If you develop nausea and vomiting that is not controlled by your nausea medication, call the clinic.   BELOW ARE SYMPTOMS THAT SHOULD BE REPORTED IMMEDIATELY:  *FEVER GREATER THAN 100.5 F  *CHILLS WITH OR WITHOUT FEVER  NAUSEA AND VOMITING THAT IS NOT CONTROLLED WITH YOUR NAUSEA MEDICATION  *UNUSUAL SHORTNESS OF BREATH  *UNUSUAL BRUISING OR BLEEDING  TENDERNESS IN MOUTH AND THROAT WITH OR WITHOUT PRESENCE OF ULCERS  *URINARY PROBLEMS  *BOWEL PROBLEMS  UNUSUAL RASH Items with * indicate a potential emergency and should be followed up as soon as possible.  Feel free to call the clinic you have any questions or concerns. The clinic phone number is [phone].  Please show the [facility] at check-in to the Emergency Department and triage nurse.

## 2015-11-16 ENCOUNTER — Encounter: Payer: Self-pay | Admitting: Pharmacist

## 2015-11-16 NOTE — Progress Notes (Signed)
Oral Chemotherapy Follow-Up Form  Original Start date of oral chemotherapy: __2/20/17__   Called patient today to follow up regarding patient's oral chemotherapy medication: _Revlimid/velcade_  Pt is doing well today. Started Revlimid with cycle 2 yesterday. Also received velcade yesterday. No issues so far with treatment. There was some delay and issues with insurance regarding her Revlimid. She finally received this from [place] I believe. No other issues to report    Will follow up and call patient again in _2 weeks at end of 2nd cycle___   Thank you,  Montel Clock, PharmD, [facility]

## 2015-11-17 ENCOUNTER — Other Ambulatory Visit: Payer: Self-pay | Admitting: *Deleted

## 2015-11-17 ENCOUNTER — Telehealth: Payer: Self-pay | Admitting: *Deleted

## 2015-11-17 DIAGNOSIS — C9000 Multiple myeloma not having achieved remission: Secondary | ICD-10-CM

## 2015-11-17 MED ORDER — LENALIDOMIDE 25 MG PO CAPS: 25.0000 mg | ORAL_CAPSULE | Freq: Every day | ORAL | Status: DC

## 2015-11-17 NOTE — Telephone Encounter (Signed)
"  I'm calling to let you all know I woke up at 1:30 am with temperature of 100.9.  At 5:30 am temp = 99.3.  I did feel cold when Temperature was up.  No cough, congestion.  Bladder and bowels working well.  Skin is red as usual from injection.  I hurt in my back but I took a fall over the weekend.  No nausea.  I'm at work and can be reached via mobile [phone]."

## 2015-11-17 NOTE — Telephone Encounter (Signed)
Called patient to discuss signs/symptoms. Patient is currently feeling well with no complaints. No fever at this current time. Patient denies SOB/chest pain/congestion. Informed patient to call if she has any other symptoms. Patient verbalized understanding.

## 2015-11-17 NOTE — Progress Notes (Signed)
Marland Kitchen    HEMATOLOGY/ONCOLOGY CONSULTATION NOTE  Date of Service: .11/15/2015  Patient Care Team: [name], MD as PCP - General (Family Medicine)  CHIEF COMPLAINTS/PURPOSE OF CONSULTATION:  Follow up for new diagnosis of multiple myeloma  DIAGNOSIS  IgA lambda R-ISS stage II multiple myeloma with innumerable lytic lesions in the calvarium, lesion in T7 vertebra and multiple lesions in the pelvis.  No significant bone pain at this time.  Planned treatment Vd for cycle 1 VRd x 3-4 cycles after Vd Zometa q4weeks Consolidation with Autologous HSCT after treatment  Prophylactic medications - aspirin 325 mg by mouth daily. - Acyclovir 400 mg oral twice daily.  HISTORY OF PRESENTING ILLNESS: Please see my initial consultation for details of her initial presentation.  INTERVAL HISTORY  Paula Johnson is here for her scheduled followup for MM. She was seen with my nurse practitioner on 11/08/2015 when she had some new rash on her thigh and fevers off of 103 cultures were negative. Patient's fevers resolved.  thought to be likely related to her Velcade. We decided to split her dexamethasone so she takes 20 mg before day 1, day 4, day 8 and day 11 Velcade and the full dose of 40 mg on day 15. After doing that her rash has resolved .no further fevers . She is starting cycle 2 today and has taken her first dose of Revlimid and has had no issues with that thus far. Notes some fatigue and wishes she could get more time off of work to focus on other well less activities including meditation and exercise but notes that she does have to continue work and wants to save her vacation time for the bone marrow transplant.   MEDICAL HISTORY:  Past Medical History  Diagnosis Date  . Prediabetes   . Fibroid     reason for Hysterectomy  . Urinary incontinence   . PONV (postoperative nausea and vomiting)     thinks morphine caused nausea  . Pain aggravated by activities of daily living     states she  pulled something at right sternum in dec 2016  . Cancer ([place]) 1999    squamous cell carcinoma of thymus    SURGICAL HISTORY: Past Surgical History  Procedure Laterality Date  . Thymectomy  1999  . Abdominal hysterectomy  1997    TAH--ovaries remain--Paula. Newton Pigg    SOCIAL HISTORY: Social History   Social History  . Marital Status: Widowed    Spouse Name: N/A  . Number of Children: N/A  . Years of Education: N/A   Occupational History  . Not on file.   Social History Main Topics  . Smoking status: Never Smoker   . Smokeless tobacco: Never Used  . Alcohol Use: No  . Drug Use: No  . Sexual Activity:    Partners: Male    Birth Control/ Protection: Surgical     Comment: TAH--ovaries remain   Other Topics Concern  . Not on file   Social History Narrative    FAMILY HISTORY: Family History  Problem Relation Age of Onset  . COPD Father     dec age 27  . Diabetes Brother   . Other Brother     committed suicide age 10  . Hyperlipidemia Mother   . Diabetes Maternal Grandmother     ALLERGIES:  is allergic to ampicillin.  MEDICATIONS:  Current Outpatient Prescriptions  Medication Sig Dispense Refill  . acyclovir (ZOVIRAX) 400 MG tablet Take 1 tablet (400 mg total) by mouth  2 (two) times daily. 60 tablet 3  . aspirin EC 325 MG tablet Take 1 tablet (325 mg total) by mouth daily. 60 tablet 3  . cetirizine (ZYRTEC) 5 MG tablet Take 5 mg by mouth daily.    Marland Kitchen dexamethasone (DECADRON) 4 MG tablet Take 10 tablets (40 mg) on days 1, 8, and 15 of chemo. Repeat every 21 days. 30 tablet 3  . famotidine (PEPCID) 10 MG tablet Take 10 mg by mouth 2 (two) times daily.    Derrill Memo ON 12/06/2015] lenalidomide (REVLIMID) 25 MG capsule Take 1 capsule (25 mg total) by mouth daily. Take 1 capsule on days 1-14. Repeat every 21 days. Authorization # [id number] (Valid for 30days) 14 capsule 3  . LORazepam (ATIVAN) 0.5 MG tablet Take 1 tablet (0.5 mg total) by mouth every 6 (six) hours as  needed (Nausea or vomiting). 30 tablet 0  . ondansetron (ZOFRAN) 8 MG tablet Take 1 tablet (8 mg total) by mouth 2 (two) times daily as needed (Nausea or vomiting). 30 tablet 1  . prochlorperazine (COMPAZINE) 10 MG tablet Take 1 tablet (10 mg total) by mouth every 6 (six) hours as needed (Nausea or vomiting). 30 tablet 1  . senna-docusate (SENNA S) 8.6-50 MG tablet Take 2 tablets by mouth at bedtime. 60 tablet 1   No current facility-administered medications for this visit.    REVIEW OF SYSTEMS:    10 Point review of Systems was done is negative except as noted above.  PHYSICAL EXAMINATION: ECOG PERFORMANCE STATUS: 1 - Symptomatic but completely ambulatory  . Filed Vitals:   11/15/15 1515  BP: 127/78  Pulse: 104  Temp: 97.7 F (36.5 C)  Resp: 18   Filed Weights   11/15/15 1515  Weight: 131 lb 14.4 oz (59.829 kg)   .Body mass index is 20.06 kg/(m^2).  GENERAL:alert, in no acute distress and comfortable SKIN: skin color, texture, turgor are normal, no rashes or significant lesions EYES: normal, conjunctiva are pink and non-injected, sclera clear OROPHARYNX:no exudate, no erythema and lips, buccal mucosa, and tongue normal  NECK: supple, no JVD, thyroid normal size, non-tender, without nodularity LYMPH:  no palpable lymphadenopathy in the cervical, axillary or inguinal LUNGS: clear to auscultation with normal respiratory effort HEART: regular rate & rhythm,  no murmurs and no lower extremity edema ABDOMEN: abdomen soft, non-tender, normoactive bowel sounds  Musculoskeletal: no cyanosis of digits and no clubbing  PSYCH: alert & oriented x 3 with fluent speech NEURO: no focal motor/sensory deficits  LABORATORY DATA:  I have reviewed the data as listed   Outside labs SPEP with 2 monoclonal bands 3.7 g/dL and the second band 1.2 g/dL (total 4.9 g/dl M protein). IFE Showed monoclonal IgA lambda protein.   LDH 141  Sedimentation rate 143 Beta-2 microglobulin 4.85 Albumin  3.5     CYTOGENETICS:  About 75% clone of plasma cells showing t(11:14) mutation consistent with multiple myeloma on FISH  RADIOGRAPHIC STUDIES: I have personally reviewed the radiological images as listed and agreed with the findings in the report. Ct Abdomen Pelvis Wo Contrast  11/05/2015  CLINICAL DATA:  Gross hematuria. Fever and chills. Multiple myeloma. EXAM: CT ABDOMEN AND PELVIS WITHOUT CONTRAST TECHNIQUE: Multidetector CT imaging of the abdomen and pelvis was performed following the standard protocol without IV contrast. COMPARISON:  PET-CT on 10/12/2015 FINDINGS: Lower chest:  No acute findings. Hepatobiliary: No mass visualized on this un-enhanced exam. Gallbladder is unremarkable. Pancreas: No mass or inflammatory process identified on this un-enhanced exam. Spleen:  Within normal limits in size. Adrenals/Urinary Tract: A 2 mm nonobstructive calculus is again seen in the upper pole of the left kidney. No right renal calculi identified. No evidence of ureteral calculi or hydronephrosis involving either kidney. No bladder calculi identified. Stomach/Bowel: No evidence of obstruction, inflammatory process, or abnormal fluid collections. Large colonic stool burden again noted. Vascular/Lymphatic: No pathologically enlarged lymph nodes. No evidence of abdominal aortic aneurysm. Reproductive: Prior hysterectomy again noted. Adnexal regions are unremarkable. Vaginal pessary again noted. Other: None. Musculoskeletal: Multiple small lucent bone lesions again seen throughout the visualized skeleton, without significant change. This is consistent with patient's known multiple myeloma. No pathologic fractures identified. IMPRESSION: Tiny nonobstructive left renal calculus. No evidence of ureteral calculi, hydronephrosis, or other acute findings. Large stool burden again noted; suggest clinical correlation for possible constipation. No significant change and widespread small lytic bone lesions, consistent  with known multiple myeloma. Electronically Signed   By: Earle Gell M.D.   On: 11/05/2015 14:57    ASSESSMENT & PLAN:    Very pleasant 60 year old Caucasian female with  1) Newly diagnosed R-ISS Stage II IgA lambda multiple myeloma with standard risk cytogenetics t (11;14). PET/CT shows evidence of lytic bone lesions in the calvarium, iliac bones  and T7. No hypercalcemia or renal failure at this time. Pretreatment M spike of 4.9 in 2 monoclonal bands. BM bx with 69% plasma cells with Lambda light chain restriction Patient is status post cycle 1 of Vd she tolerated well other than some grade 1 rash. Also noted to have fevers from Velcade. Symptoms have been well controlled after we split dosed her dexamethasone to take 20 mg before each dose of Velcade on day 1, 4, 8 and 11 and 40 mg on day 15.  #2 anemia due to multiple myeloma- gradually improving #3 fatigue due to multiple myeloma and Anemia -improving #4 multiple bone metastases from multiple myeloma. - On Zometa no bone pains currently. #5 Mild B/L thigh macular rash likely from Velcade - resolved. Has been taking antihistamines and is using dexamethasone pre-Velcade as instructed and this seems to have resolved the rash. Plan -patient has completed 1st cycle with Vd alone with twice weekly Velcade. -She has been started her second cycle of treatment with VRd today. Has  the first dose of lenalidomide without any issues. --Zometa every 4 weekly for bone metastases. -Aspirin daily for VTE prophylaxis with Revlimid -acyclovir for shingles prophylaxis ( patient notes that she did get a shingles vaccine about 2 years ago). -Would plan to do 4 cycles of VRd and then consider consolidation with an Autologus HSCT. -counseled on need for good hydration and focus on good po intake. -OTC Cetrizine and Famotidine for rash. May use topical hydrocortisone for localized rash. She was educated to call us   immediately if the rash is  worsening.  RTC with Paula Johnson in 2 weeks for continued followup. Cbc, cmp Weekly and myeloma panel q 3weeks before each cycle of treatment.  I spent 25 minutes counseling the patient face to face. The total time spent in the appointment was 30 minutes and more than 50% was on counseling and direct patient cares.    Paula Lone MD [place] AAHIVMS [facility] Hematology/Oncology Physician [facility]  (Office):       [phone] (Work cell):  [phone] (Fax):           [phone]

## 2015-11-18 ENCOUNTER — Telehealth: Payer: Self-pay | Admitting: Hematology

## 2015-11-18 ENCOUNTER — Telehealth: Payer: Self-pay | Admitting: *Deleted

## 2015-11-18 ENCOUNTER — Ambulatory Visit (HOSPITAL_BASED_OUTPATIENT_CLINIC_OR_DEPARTMENT_OTHER): Payer: BLUE CROSS/BLUE SHIELD

## 2015-11-18 ENCOUNTER — Other Ambulatory Visit: Payer: Self-pay

## 2015-11-18 VITALS — BP 132/80 | HR 91 | Temp 97.5°F | Resp 16

## 2015-11-18 DIAGNOSIS — C9000 Multiple myeloma not having achieved remission: Secondary | ICD-10-CM

## 2015-11-18 DIAGNOSIS — Z5112 Encounter for antineoplastic immunotherapy: Secondary | ICD-10-CM | POA: Diagnosis not present

## 2015-11-18 MED ORDER — PROCHLORPERAZINE MALEATE 10 MG PO TABS
10.0000 mg | ORAL_TABLET | Freq: Once | ORAL | Status: AC
  Administered 2015-11-18: 10 mg via ORAL

## 2015-11-18 MED ORDER — BORTEZOMIB CHEMO SQ INJECTION 3.5 MG (2.5MG/ML)
1.3000 mg/m2 | Freq: Once | INTRAMUSCULAR | Status: AC
  Administered 2015-11-18: 2.25 mg via SUBCUTANEOUS
  Filled 2015-11-18: qty 2.25

## 2015-11-18 MED ORDER — PROCHLORPERAZINE MALEATE 10 MG PO TABS
ORAL_TABLET | ORAL | Status: AC
  Filled 2015-11-18: qty 1

## 2015-11-18 NOTE — Telephone Encounter (Signed)
Per staff message and POF I have scheduled appts. Advised scheduler of appts. JMW  

## 2015-11-18 NOTE — Telephone Encounter (Signed)
per pof to sch pt appt-sent MW email to [facility] trmt-pt to ge updated copy today @ appt

## 2015-11-18 NOTE — Patient Instructions (Signed)
[facility] Discharge Instructions for Patients Receiving Chemotherapy  Today you received the following chemotherapy agents velcade   To help prevent nausea and vomiting after your treatment, we encourage you to take your nausea medication as directed  If you develop nausea and vomiting that is not controlled by your nausea medication, call the clinic.   BELOW ARE SYMPTOMS THAT SHOULD BE REPORTED IMMEDIATELY:  *FEVER GREATER THAN 100.5 F  *CHILLS WITH OR WITHOUT FEVER  NAUSEA AND VOMITING THAT IS NOT CONTROLLED WITH YOUR NAUSEA MEDICATION  *UNUSUAL SHORTNESS OF BREATH  *UNUSUAL BRUISING OR BLEEDING  TENDERNESS IN MOUTH AND THROAT WITH OR WITHOUT PRESENCE OF ULCERS  *URINARY PROBLEMS  *BOWEL PROBLEMS  UNUSUAL RASH Items with * indicate a potential emergency and should be followed up as soon as possible.  Feel free to call the clinic you have any questions or concerns. The clinic phone number is [phone].  

## 2015-11-20 ENCOUNTER — Encounter (HOSPITAL_COMMUNITY): Payer: Self-pay | Admitting: Emergency Medicine

## 2015-11-20 ENCOUNTER — Emergency Department (HOSPITAL_COMMUNITY): Payer: BLUE CROSS/BLUE SHIELD

## 2015-11-20 ENCOUNTER — Emergency Department (HOSPITAL_COMMUNITY)
Admission: EM | Admit: 2015-11-20 | Discharge: 2015-11-20 | Disposition: A | Discharge status: Home / Self Care | Payer: BLUE CROSS/BLUE SHIELD | Attending: Emergency Medicine | Admitting: Emergency Medicine

## 2015-11-20 DIAGNOSIS — Z79899 Other long term (current) drug therapy: Secondary | ICD-10-CM | POA: Diagnosis not present

## 2015-11-20 DIAGNOSIS — Z880 Allergy status to penicillin: Secondary | ICD-10-CM | POA: Diagnosis not present

## 2015-11-20 DIAGNOSIS — Z7982 Long term (current) use of aspirin: Secondary | ICD-10-CM | POA: Insufficient documentation

## 2015-11-20 DIAGNOSIS — Z8579 Personal history of other malignant neoplasms of lymphoid, hematopoietic and related tissues: Secondary | ICD-10-CM | POA: Diagnosis not present

## 2015-11-20 DIAGNOSIS — R109 Unspecified abdominal pain: Secondary | ICD-10-CM | POA: Diagnosis present

## 2015-11-20 DIAGNOSIS — Z85828 Personal history of other malignant neoplasm of skin: Secondary | ICD-10-CM | POA: Diagnosis not present

## 2015-11-20 DIAGNOSIS — Z86018 Personal history of other benign neoplasm: Secondary | ICD-10-CM | POA: Diagnosis not present

## 2015-11-20 LAB — CBC WITH DIFFERENTIAL/PLATELET
BASOS PCT: 1 %
Basophils Absolute: 0 10*3/uL (ref 0.0–0.1)
EOS ABS: 0 10*3/uL (ref 0.0–0.7)
EOS PCT: 2 %
HCT: 28.4 % — ABNORMAL LOW (ref 36.0–46.0)
HEMOGLOBIN: 9.3 g/dL — AB (ref 12.0–15.0)
Lymphocytes Relative: 29 %
Lymphs Abs: 0.6 10*3/uL — ABNORMAL LOW (ref 0.7–4.0)
MCH: 30.6 pg (ref 26.0–34.0)
MCHC: 32.7 g/dL (ref 30.0–36.0)
MCV: 93.4 fL (ref 78.0–100.0)
MONOS PCT: 13 %
Monocytes Absolute: 0.3 10*3/uL (ref 0.1–1.0)
NEUTROS PCT: 55 %
Neutro Abs: 1.1 10*3/uL — ABNORMAL LOW (ref 1.7–7.7)
PLATELETS: 325 10*3/uL (ref 150–400)
RBC: 3.04 MIL/uL — AB (ref 3.87–5.11)
RDW: 16.8 % — ABNORMAL HIGH (ref 11.5–15.5)
WBC: 2 10*3/uL — AB (ref 4.0–10.5)

## 2015-11-20 LAB — COMPREHENSIVE METABOLIC PANEL
ALBUMIN: 3.2 g/dL — AB (ref 3.5–5.0)
ALK PHOS: 70 U/L (ref 38–126)
ALT: 15 U/L (ref 14–54)
ANION GAP: 11 (ref 5–15)
AST: 16 U/L (ref 15–41)
BUN: 21 mg/dL — ABNORMAL HIGH (ref 6–20)
CALCIUM: 9 mg/dL (ref 8.9–10.3)
CO2: 26 mmol/L (ref 22–32)
Chloride: 104 mmol/L (ref 101–111)
Creatinine, Ser: 0.79 mg/dL (ref 0.44–1.00)
GFR calc non Af Amer: >60 mL/min (ref >60)
GLUCOSE: 82 mg/dL (ref 65–99)
POTASSIUM: 4.1 mmol/L (ref 3.5–5.1)
SODIUM: 141 mmol/L (ref 135–145)
TOTAL PROTEIN: 9.2 g/dL — AB (ref 6.5–8.1)
Total Bilirubin: 0.4 mg/dL (ref 0.3–1.2)

## 2015-11-20 LAB — URINALYSIS, ROUTINE W REFLEX MICROSCOPIC
BILIRUBIN URINE: NEGATIVE
Glucose, UA: NEGATIVE mg/dL
KETONES UR: NEGATIVE mg/dL
LEUKOCYTES UA: NEGATIVE
NITRITE: NEGATIVE
PROTEIN: 30 mg/dL — AB
Specific Gravity, Urine: 1.02 (ref 1.005–1.030)
pH: 7 (ref 5.0–8.0)

## 2015-11-20 LAB — I-STAT CG4 LACTIC ACID, ED: Lactic Acid, Venous: 0.76 mmol/L (ref 0.5–2.0)

## 2015-11-20 LAB — URINE MICROSCOPIC-ADD ON

## 2015-11-20 NOTE — ED Notes (Signed)
Pt reports R flank pain since yesterday afternoon. Denies dysuria or blood in urine. Pt on chemo; last dose Thursday. Has had a fever at home.

## 2015-11-20 NOTE — ED Provider Notes (Signed)
CSN: [id number]     Arrival date & time 11/20/15  1137 History   First MD Initiated Contact with Patient 11/20/15 1303     Chief Complaint  Patient presents with  . Flank Pain  . on chemo    (Consider location/radiation/quality/duration/timing/severity/associated sxs/prior Treatment) HPI 60 y.o. female with a hx of Multiple Myeloma, presents to the Emergency Department today complaining of right flank pain that started yesterday afternoon. Notes the pain was gradual throughout the day. States that the pain is 7/10 stabbing sensation with movement. When lying still she does not have any pain. No dysuria. No hematuria. Patient is currently being treated for Multiple Myeloma with chemotherapy. Usually develops fever after treatments. No fever currently. No CP/SOB/ABD pain. No N/V/D. No headache. No numbness/tingling.    Past Medical History  Diagnosis Date  . Prediabetes   . Fibroid     reason for Hysterectomy  . Urinary incontinence   . PONV (postoperative nausea and vomiting)     thinks morphine caused nausea  . Pain aggravated by activities of daily living     states she pulled something at right sternum in dec 2016  . Cancer ([place]) 1999    squamous cell carcinoma of thymus   Past Surgical History  Procedure Laterality Date  . Thymectomy  1999  . Abdominal hysterectomy  1997    TAH--ovaries remain--Dr. Newton Pigg   Family History  Problem Relation Age of Onset  . COPD Father     dec age 65  . Diabetes Brother   . Other Brother     committed suicide age 44  . Hyperlipidemia Mother   . Diabetes Maternal Grandmother    Social History  Substance Use Topics  . Smoking status: Never Smoker   . Smokeless tobacco: Never Used  . Alcohol Use: No   OB History    Gravida Para Term Preterm AB TAB SAB Ectopic Multiple Living   2 2 2       2      Review of Systems ROS reviewed and all are negative for acute change except as noted in the HPI.  Allergies  Ampicillin  Home  Medications   Prior to Admission medications   Medication Sig Start Date End Date Taking? Authorizing Provider  acyclovir (ZOVIRAX) 400 MG tablet Take 1 tablet (400 mg total) by mouth 2 (two) times daily. 10/19/15   Brunetta Genera, MD  aspirin EC 325 MG tablet Take 1 tablet (325 mg total) by mouth daily. 10/20/15   Brunetta Genera, MD  cetirizine (ZYRTEC) 5 MG tablet Take 5 mg by mouth daily.    Historical Provider, MD  dexamethasone (DECADRON) 4 MG tablet Take 10 tablets (40 mg) on days 1, 8, and 15 of chemo. Repeat every 21 days. 10/19/15   Brunetta Genera, MD  famotidine (PEPCID) 10 MG tablet Take 10 mg by mouth 2 (two) times daily.    Historical Provider, MD  lenalidomide (REVLIMID) 25 MG capsule Take 1 capsule (25 mg total) by mouth daily. Take 1 capsule on days 1-14. Repeat every 21 days. Authorization # [id number] (Valid for 30days) 12/06/15   Brunetta Genera, MD  LORazepam (ATIVAN) 0.5 MG tablet Take 1 tablet (0.5 mg total) by mouth every 6 (six) hours as needed (Nausea or vomiting). 10/20/15   Brunetta Genera, MD  ondansetron (ZOFRAN) 8 MG tablet Take 1 tablet (8 mg total) by mouth 2 (two) times daily as needed (Nausea or vomiting). 10/19/15   Gautam  Juleen China, MD  prochlorperazine (COMPAZINE) 10 MG tablet Take 1 tablet (10 mg total) by mouth every 6 (six) hours as needed (Nausea or vomiting). 10/19/15   Brunetta Genera, MD  senna-docusate (SENNA S) 8.6-50 MG tablet Take 2 tablets by mouth at bedtime. 11/15/15   Brunetta Genera, MD   BP 115/75 mmHg  Pulse 95  Temp(Src) 97.9 F (36.6 C) (Oral)  Resp 16  SpO2 98%   Physical Exam  Constitutional: She is oriented to person, place, and time. She appears well-developed and well-nourished.  HENT:  Head: Normocephalic and atraumatic.  Eyes: EOM are normal. Pupils are equal, round, and reactive to light.  Neck: Normal range of motion. Neck supple. No tracheal deviation present.  Cardiovascular: Normal rate, regular  rhythm and normal heart sounds.   Pulmonary/Chest: Effort normal and breath sounds normal. No respiratory distress. She has no wheezes. She has no rales.  Abdominal: Soft. Normal appearance and bowel sounds are normal. There is no tenderness. There is CVA tenderness. There is no rebound, no tenderness at McBurney's point and negative Murphy's sign.  Musculoskeletal: Normal range of motion.  Neurological: She is alert and oriented to person, place, and time.  Skin: Skin is warm and dry.  Psychiatric: She has a normal mood and affect. Her behavior is normal. Thought content normal.  Nursing note and vitals reviewed.  ED Course  Procedures (including critical care time) Labs Review Labs Reviewed  COMPREHENSIVE METABOLIC PANEL - Abnormal; Notable for the following:    BUN 21 (*)    Total Protein 9.2 (*)    Albumin 3.2 (*)    All other components within normal limits  URINALYSIS, ROUTINE W REFLEX MICROSCOPIC (NOT AT [facility]) - Abnormal; Notable for the following:    Hgb urine dipstick LARGE (*)    Protein, ur 30 (*)    All other components within normal limits  CBC WITH DIFFERENTIAL/PLATELET - Abnormal; Notable for the following:    WBC 2.0 (*)    RBC 3.04 (*)    Hemoglobin 9.3 (*)    HCT 28.4 (*)    RDW 16.8 (*)    Neutro Abs 1.1 (*)    Lymphs Abs 0.6 (*)    All other components within normal limits  URINE MICROSCOPIC-ADD ON - Abnormal; Notable for the following:    Squamous Epithelial / LPF 0-5 (*)    Bacteria, UA RARE (*)    All other components within normal limits  URINE CULTURE  I-STAT CG4 LACTIC ACID, ED  I-STAT CG4 LACTIC ACID, ED   Imaging Review Ct Renal Stone Study  11/20/2015  CLINICAL DATA:  Right flank pain EXAM: CT ABDOMEN AND PELVIS WITHOUT CONTRAST TECHNIQUE: Multidetector CT imaging of the abdomen and pelvis was performed following the standard protocol without IV contrast. COMPARISON:  11/05/2015 FINDINGS: The lung bases are free of acute infiltrate or sizable  effusion. The liver, gallbladder, spleen, adrenal glands and pancreas are within normal limits. Ingested material is noted within the proximal duodenum. The left kidney again demonstrates some nonobstructing renal calculi. No ureteral stones are noted. The right kidney shows no obstructive change or renal calculi. The bladder is partially distended. A cervical cerclage ring is noted. Persistent fecal material is noted throughout the colon consistent with a degree of constipation. The appendix is within normal limits. The uterus has been surgically removed. No pelvic mass lesion is noted. No acute bony abnormality is seen. Scattered spinal lucencies are again identified throughout the bony structures consistent  with the given clinical history of multiple myeloma. IMPRESSION: Stable nonobstructing left renal calculi. Stable changes of multiple myeloma. Large stool burden consistent with constipation stable from the prior study. Electronically Signed   By: Inez Catalina M.D.   On: 11/20/2015 14:39   I have personally reviewed and evaluated these images and lab results as part of my medical decision-making.   EKG Interpretation None      MDM  I have reviewed and evaluated the relevant laboratory values. I have reviewed and evaluated the relevant imaging studies. I have reviewed the relevant previous healthcare records. I obtained HPI from historian. Patient discussed with supervising physician  ED Course:  Assessment: 34y F with hx Multiple Myeloma presents with right flank pain since yesterday. On exam, pt in NAD. Nontoxic/nonseptic appearing. VSS. Afebrile. Lungs CTA. Heart RRR. Abdomen nontender/soft. Positive CVA tenderness. Due to fever, I ordered a lactate, which was negative. CMP WNL. CBC showed leukopenia, but patient had just received chemotherapy this week. Cautioned on infection risk. UA showed large Hgb. Ordered CT Renal, which was unremarkable. Most likely flank pain is musculoskeletal. No  signs of infection/pyelonephritis. Will DC home with symptomatic relief and follow up with PCP. Patient understands and agrees with plan. At time of discharge, Patient is in no acute distress. Vital Signs are stable. Patient is able to ambulate. Patient able to tolerate PO.     Disposition/Plan:  DC Home Additional Verbal discharge instructions given and discussed with patient.  Pt Instructed to f/u with PCP in the next 48-72 hours for evaluation and treatment of symptoms. Return precautions given Pt acknowledges and agrees with plan  Supervising Physician Nat Christen, MD   Final diagnoses:  Flank pain      Shary Decamp, PA-C 11/20/15 [place], MD 11/21/15 [phone]

## 2015-11-20 NOTE — ED Notes (Signed)
Family at bedside. 

## 2015-11-20 NOTE — ED Notes (Signed)
Patient is resting comfortably. 

## 2015-11-20 NOTE — Discharge Instructions (Signed)
Please read and follow all provided instructions.  Your diagnoses today include:  1. Flank pain    Tests performed today include:  Vital signs. See below for your results today.   Medications prescribed:  None   Home care instructions:  Follow any educational materials contained in this packet.  Follow-up instructions: Please follow-up with your primary care provider in the next 48-72 hours for further evaluation of symptoms and treatment   Return instructions:   Please return to the Emergency Department if you do not get better, if you get worse, or new symptoms OR  - Fever (temperature greater than 101.28F)  - Bleeding that does not stop with holding pressure to the area    -Severe pain (please note that you may be more sore the day after your accident)  - Chest Pain  - Difficulty breathing  - Severe nausea or vomiting  - Inability to tolerate food and liquids  - Passing out  - Skin becoming red around your wounds  - Change in mental status (confusion or lethargy)  - New numbness or weakness     Please return if you have any other emergent concerns.  Additional Information:  Your vital signs today were: BP 117/74 mmHg   Pulse 97   Temp(Src) 97.9 F (36.6 C) (Oral)   Resp 16   SpO2 99% If your blood pressure (BP) was elevated above 135/85 this visit, please have this repeated by your doctor within one month. ---------------

## 2015-11-22 ENCOUNTER — Other Ambulatory Visit (HOSPITAL_BASED_OUTPATIENT_CLINIC_OR_DEPARTMENT_OTHER): Payer: BLUE CROSS/BLUE SHIELD

## 2015-11-22 ENCOUNTER — Ambulatory Visit (HOSPITAL_BASED_OUTPATIENT_CLINIC_OR_DEPARTMENT_OTHER): Payer: BLUE CROSS/BLUE SHIELD

## 2015-11-22 ENCOUNTER — Telehealth: Payer: Self-pay | Admitting: Hematology

## 2015-11-22 VITALS — BP 125/67 | HR 87 | Temp 98.2°F | Resp 16

## 2015-11-22 DIAGNOSIS — Z5112 Encounter for antineoplastic immunotherapy: Secondary | ICD-10-CM | POA: Diagnosis not present

## 2015-11-22 DIAGNOSIS — C9000 Multiple myeloma not having achieved remission: Secondary | ICD-10-CM | POA: Diagnosis not present

## 2015-11-22 LAB — CBC & DIFF AND RETIC
BASO%: 0.4 % (ref 0.0–2.0)
BASOS ABS: 0 10*3/uL (ref 0.0–0.1)
EOS ABS: 0 10*3/uL (ref 0.0–0.5)
EOS%: 0 % (ref 0.0–7.0)
HEMATOCRIT: 28.4 % — AB (ref 34.8–46.6)
HEMOGLOBIN: 9.2 g/dL — AB (ref 11.6–15.9)
IMMATURE RETIC FRACT: 5.5 % (ref 1.60–10.00)
LYMPH#: 0.5 10*3/uL — AB (ref 0.9–3.3)
LYMPH%: 18.1 % (ref 14.0–49.7)
MCH: 29.8 pg (ref 25.1–34.0)
MCHC: 32.4 g/dL (ref 31.5–36.0)
MCV: 91.9 fL (ref 79.5–101.0)
MONO#: 0.1 10*3/uL (ref 0.1–0.9)
MONO%: 3.5 % (ref 0.0–14.0)
NEUT#: 2 10*3/uL (ref 1.5–6.5)
NEUT%: 78 % — AB (ref 38.4–76.8)
PLATELETS: 260 10*3/uL (ref 145–400)
RBC: 3.09 10*6/uL — ABNORMAL LOW (ref 3.70–5.45)
RDW: 16.8 % — ABNORMAL HIGH (ref 11.2–14.5)
RETIC CT ABS: 22.87 10*3/uL — AB (ref 33.70–90.70)
Retic %: 0.74 % (ref 0.70–2.10)
WBC: 2.6 10*3/uL — ABNORMAL LOW (ref 3.9–10.3)

## 2015-11-22 LAB — COMPREHENSIVE METABOLIC PANEL
ALBUMIN: 3.3 g/dL — AB (ref 3.5–5.0)
ALT: 14 U/L (ref 0–55)
ANION GAP: 14 meq/L — AB (ref 3–11)
AST: 15 U/L (ref 5–34)
Alkaline Phosphatase: 81 U/L (ref 40–150)
BILIRUBIN TOTAL: 0.4 mg/dL (ref 0.20–1.20)
BUN: 24.8 mg/dL (ref 7.0–26.0)
CALCIUM: 9 mg/dL (ref 8.4–10.4)
CO2: 19 mEq/L — ABNORMAL LOW (ref 22–29)
CREATININE: 0.9 mg/dL (ref 0.6–1.1)
Chloride: 100 mEq/L (ref 98–109)
EGFR: 71 mL/min/{1.73_m2} — ABNORMAL LOW (ref >90)
Glucose: 165 mg/dl — ABNORMAL HIGH (ref 70–140)
Potassium: 4.5 mEq/L (ref 3.5–5.1)
Sodium: 133 mEq/L — ABNORMAL LOW (ref 136–145)
TOTAL PROTEIN: 9.7 g/dL — AB (ref 6.4–8.3)

## 2015-11-22 LAB — URINE CULTURE: Culture: 20000

## 2015-11-22 MED ORDER — SODIUM CHLORIDE 0.9 % IV SOLN
Freq: Once | INTRAVENOUS | Status: AC
  Administered 2015-11-22: 16:00:00 via INTRAVENOUS

## 2015-11-22 MED ORDER — PROCHLORPERAZINE MALEATE 10 MG PO TABS
ORAL_TABLET | ORAL | Status: AC
  Filled 2015-11-22: qty 1

## 2015-11-22 MED ORDER — PROCHLORPERAZINE MALEATE 10 MG PO TABS
10.0000 mg | ORAL_TABLET | Freq: Once | ORAL | Status: AC
  Administered 2015-11-22: 10 mg via ORAL

## 2015-11-22 MED ORDER — BORTEZOMIB CHEMO SQ INJECTION 3.5 MG (2.5MG/ML)
1.3000 mg/m2 | Freq: Once | INTRAMUSCULAR | Status: AC
  Administered 2015-11-22: 2.25 mg via SUBCUTANEOUS
  Filled 2015-11-22: qty 2.25

## 2015-11-22 MED ORDER — ZOLEDRONIC ACID 4 MG/100ML IV SOLN
4.0000 mg | Freq: Once | INTRAVENOUS | Status: AC
  Administered 2015-11-22: 4 mg via INTRAVENOUS
  Filled 2015-11-22: qty 100

## 2015-11-22 NOTE — Telephone Encounter (Signed)
Patient called to cx 3/2 and 3/16 lab. Per patient labs were once every Monday/Thursday but is now only done on Mondays - confirmed with desk nurse and cxd labs for 3/2 and 3/16 - patient aware and has infusion times for 3/2 and 3/16. Other appointments remain the same.

## 2015-11-22 NOTE — Patient Instructions (Addendum)
[facility] Discharge Instructions for Patients Receiving Chemotherapy  Today you received the following chemotherapy agents: Velcade.  To help prevent nausea and vomiting after your treatment, we encourage you to take your nausea medication: Lorazepam, Compazine and Zofran as directed.   If you develop nausea and vomiting that is not controlled by your nausea medication, call the clinic.   BELOW ARE SYMPTOMS THAT SHOULD BE REPORTED IMMEDIATELY:  *FEVER GREATER THAN 100.5 F  *CHILLS WITH OR WITHOUT FEVER  NAUSEA AND VOMITING THAT IS NOT CONTROLLED WITH YOUR NAUSEA MEDICATION  *UNUSUAL SHORTNESS OF BREATH  *UNUSUAL BRUISING OR BLEEDING  TENDERNESS IN MOUTH AND THROAT WITH OR WITHOUT PRESENCE OF ULCERS  *URINARY PROBLEMS  *BOWEL PROBLEMS  UNUSUAL RASH Items with * indicate a potential emergency and should be followed up as soon as possible.  Feel free to call the clinic you have any questions or concerns. The clinic phone number is [phone].  Please show the [facility] at check-in to the Emergency Department and triage nurse.  Zoledronic Acid injection (Paget's Disease, Osteoporosis) What is this medicine? ZOLEDRONIC ACID (ZOE le dron ik AS id) lowers the amount of calcium loss from bone. It is used to treat Paget's disease and osteoporosis in women. This medicine may be used for other purposes; ask your health care provider or pharmacist if you have questions. What should I tell my health care provider before I take this medicine? They need to know if you have any of these conditions: -aspirin-sensitive asthma -cancer, especially if you are receiving medicines used to treat cancer -dental disease or wear dentures -infection -kidney disease -low levels of calcium in the blood -past surgery on the parathyroid gland or intestines -receiving corticosteroids like dexamethasone or prednisone -an unusual or allergic reaction to zoledronic acid, other  medicines, foods, dyes, or preservatives -pregnant or trying to get pregnant -breast-feeding How should I use this medicine? This medicine is for infusion into a vein. It is given by a health care professional in a hospital or clinic setting. Talk to your pediatrician regarding the use of this medicine in children. This medicine is not approved for use in children. Overdosage: If you think you have taken too much of this medicine contact a poison control center or emergency room at once. NOTE: This medicine is only for you. Do not share this medicine with others. What if I miss a dose? It is important not to miss your dose. Call your doctor or health care professional if you are unable to keep an appointment. What may interact with this medicine? -certain antibiotics given by injection -NSAIDs, medicines for pain and inflammation, like ibuprofen or naproxen -some diuretics like bumetanide, furosemide -teriparatide This list may not describe all possible interactions. Give your health care provider a list of all the medicines, herbs, non-prescription drugs, or dietary supplements you use. Also tell them if you smoke, drink alcohol, or use illegal drugs. Some items may interact with your medicine. What should I watch for while using this medicine? Visit your doctor or health care professional for regular checkups. It may be some time before you see the benefit from this medicine. Do not stop taking your medicine unless your doctor tells you to. Your doctor may order blood tests or other tests to see how you are doing. Women should inform their doctor if they wish to become pregnant or think they might be pregnant. There is a potential for serious side effects to an unborn child. Talk  to your health care professional or pharmacist for more information. You should make sure that you get enough calcium and vitamin D while you are taking this medicine. Discuss the foods you eat and the vitamins you take  with your health care professional. Some people who take this medicine have severe bone, joint, and/or muscle pain. This medicine may also increase your risk for jaw problems or a broken thigh bone. Tell your doctor right away if you have severe pain in your jaw, bones, joints, or muscles. Tell your doctor if you have any pain that does not go away or that gets worse. Tell your dentist and dental surgeon that you are taking this medicine. You should not have major dental surgery while on this medicine. See your dentist to have a dental exam and fix any dental problems before starting this medicine. Take good care of your teeth while on this medicine. Make sure you see your dentist for regular follow-up appointments. What side effects may I notice from receiving this medicine? Side effects that you should report to your doctor or health care professional as soon as possible: -allergic reactions like skin rash, itching or hives, swelling of the face, lips, or tongue -anxiety, confusion, or depression -breathing problems -changes in vision -eye pain -feeling faint or lightheaded, falls -jaw pain, especially after dental work -mouth sores -muscle cramps, stiffness, or weakness -redness, blistering, peeling or loosening of the skin, including inside the mouth -trouble passing urine or change in the amount of urine Side effects that usually do not require medical attention (report to your doctor or health care professional if they continue or are bothersome): -bone, joint, or muscle pain -constipation -diarrhea -fever -hair loss -irritation at site where injected -loss of appetite -nausea, vomiting -stomach upset -trouble sleeping -trouble swallowing -weak or tired This list may not describe all possible side effects. Call your doctor for medical advice about side effects. You may report side effects to FDA at 1-800-FDA-1088. Where should I keep my medicine? This drug is given in a hospital  or clinic and will not be stored at home. NOTE: This sheet is a summary. It may not cover all possible information. If you have questions about this medicine, talk to your doctor, pharmacist, or health care provider.    2016, [employer]. (2014-02-07 14:19:57)

## 2015-11-23 ENCOUNTER — Other Ambulatory Visit: Payer: Self-pay | Admitting: Hematology

## 2015-11-23 ENCOUNTER — Other Ambulatory Visit: Payer: Self-pay | Admitting: *Deleted

## 2015-11-23 ENCOUNTER — Telehealth: Payer: Self-pay | Admitting: *Deleted

## 2015-11-23 ENCOUNTER — Telehealth: Payer: Self-pay | Admitting: Hematology and Oncology

## 2015-11-23 DIAGNOSIS — C9000 Multiple myeloma not having achieved remission: Secondary | ICD-10-CM

## 2015-11-23 LAB — KAPPA/LAMBDA LIGHT CHAINS
IG LAMBDA FREE LIGHT CHAIN: 80.95 mg/L — AB (ref 5.71–26.30)
Ig Kappa Free Light Chain: 8.4 mg/L (ref 3.30–19.40)
KAPPA/LAMBDA FLC RATIO: 0.1 — AB (ref 0.26–1.65)

## 2015-11-23 NOTE — Telephone Encounter (Signed)
per pof to r/s pt appt to [facility] & Friday per pt req-sent MW email to sch trmt-will call pt after reply

## 2015-11-23 NOTE — Telephone Encounter (Signed)
"  I received Velcade yesterday.  I'm having same symptoms of chills, increase temp = 99.9 and nausea.   I've taken compazine and no vomiting.  Able to eat and drink.  I am having numbness to the tip of my rt. Finger but I had this before ai was diagnosed and it's getting back to normal.  I have pain in my left leg.  I'm at [facility].  Does he need to see me or should I go home?"   Mobile number [phone]."  Provider seeing patient at time of call.  Collaborative nurse notified and will notify provider and call patient.  Shafer notified.  These symptoms are not new, says she will head home.

## 2015-11-24 ENCOUNTER — Telehealth: Payer: Self-pay | Admitting: *Deleted

## 2015-11-24 ENCOUNTER — Telehealth: Payer: Self-pay | Admitting: Hematology

## 2015-11-24 LAB — MULTIPLE MYELOMA PANEL, SERUM
Albumin SerPl Elph-Mcnc: 3.8 g/dL (ref 2.9–4.4)
Albumin/Glob SerPl: 0.7 (ref 0.7–1.7)
Alpha 1: 0.4 g/dL (ref 0.0–0.4)
Alpha2 Glob SerPl Elph-Mcnc: 0.9 g/dL (ref 0.4–1.0)
B-GLOBULIN SERPL ELPH-MCNC: 3.9 g/dL — AB (ref 0.7–1.3)
Gamma Glob SerPl Elph-Mcnc: 0.3 g/dL — ABNORMAL LOW (ref 0.4–1.8)
Globulin, Total: 5.5 g/dL — ABNORMAL HIGH (ref 2.2–3.9)
IGM (IMMUNOGLOBIN M), SRM: 10 mg/dL — AB (ref 26–217)
IgA, Qn, Serum: 2943 mg/dL — ABNORMAL HIGH (ref 87–352)
IgG, Qn, Serum: 328 mg/dL — ABNORMAL LOW (ref 700–1600)
M Protein SerPl Elph-Mcnc: 2.8 g/dL — ABNORMAL HIGH
TOTAL PROTEIN: 9.3 g/dL — AB (ref 6.0–8.5)

## 2015-11-24 NOTE — Telephone Encounter (Signed)
per pof to sch pt appt-cld & spoke to pt and gave pt time & date of appt °

## 2015-11-24 NOTE — Telephone Encounter (Signed)
I have canceled appts for treatment next week, and labs on Friday

## 2015-11-24 NOTE — Telephone Encounter (Signed)
Per staff message and POF I have scheduled appts. Advised scheduler of appts. JMW  

## 2015-11-25 ENCOUNTER — Other Ambulatory Visit: Payer: Self-pay

## 2015-11-25 ENCOUNTER — Other Ambulatory Visit: Payer: Self-pay | Admitting: *Deleted

## 2015-11-25 ENCOUNTER — Ambulatory Visit: Payer: Self-pay

## 2015-11-25 NOTE — Progress Notes (Signed)
Faxed new revlimid prescription to [facility] #: [phone]

## 2015-11-26 ENCOUNTER — Ambulatory Visit (HOSPITAL_BASED_OUTPATIENT_CLINIC_OR_DEPARTMENT_OTHER): Payer: BLUE CROSS/BLUE SHIELD

## 2015-11-26 ENCOUNTER — Encounter (HOSPITAL_COMMUNITY): Payer: Self-pay

## 2015-11-26 VITALS — BP 124/74 | HR 84 | Temp 98.4°F | Resp 18

## 2015-11-26 DIAGNOSIS — C9000 Multiple myeloma not having achieved remission: Secondary | ICD-10-CM

## 2015-11-26 DIAGNOSIS — Z5112 Encounter for antineoplastic immunotherapy: Secondary | ICD-10-CM | POA: Diagnosis not present

## 2015-11-26 MED ORDER — PROCHLORPERAZINE MALEATE 10 MG PO TABS
10.0000 mg | ORAL_TABLET | Freq: Once | ORAL | Status: AC
  Administered 2015-11-26: 10 mg via ORAL

## 2015-11-26 MED ORDER — PROCHLORPERAZINE MALEATE 10 MG PO TABS
ORAL_TABLET | ORAL | Status: AC
  Filled 2015-11-26: qty 1

## 2015-11-26 MED ORDER — BORTEZOMIB CHEMO SQ INJECTION 3.5 MG (2.5MG/ML)
1.3000 mg/m2 | Freq: Once | INTRAMUSCULAR | Status: AC
  Administered 2015-11-26: 2.25 mg via SUBCUTANEOUS
  Filled 2015-11-26: qty 2.25

## 2015-11-29 MED ORDER — LENALIDOMIDE 25 MG PO CAPS: 25.0000 mg | ORAL_CAPSULE | Freq: Every day | ORAL | Status: DC

## 2015-11-30 ENCOUNTER — Ambulatory Visit (INDEPENDENT_AMBULATORY_CARE_PROVIDER_SITE_OTHER): Payer: BLUE CROSS/BLUE SHIELD | Admitting: Internal Medicine

## 2015-11-30 ENCOUNTER — Encounter: Payer: Self-pay | Admitting: Internal Medicine

## 2015-11-30 ENCOUNTER — Ambulatory Visit: Payer: Self-pay

## 2015-11-30 VITALS — BP 112/72 | HR 88 | Ht 68.5 in | Wt 132.8 lb

## 2015-11-30 DIAGNOSIS — R05 Cough: Secondary | ICD-10-CM

## 2015-11-30 DIAGNOSIS — R059 Cough, unspecified: Secondary | ICD-10-CM

## 2015-11-30 MED ORDER — PANTOPRAZOLE SODIUM 40 MG PO TBEC: 40.0000 mg | DELAYED_RELEASE_TABLET | Freq: Every day | ORAL | Status: DC

## 2015-11-30 MED ORDER — PREDNISONE 10 MG PO TABS: ORAL_TABLET | ORAL | Status: DC

## 2015-11-30 NOTE — Patient Instructions (Addendum)
Pantoprazole (protonix) 40 mg   Take  30-60 min before first meal of the day and Pepcid (famotidine)  20 mg one  One hour before  bedtime until return to office - this is the best way to tell whether stomach acid is contributing to your problem.    Prednisone 10 mg take  4 each am x 2 days,   2 each am x 2 days,  1 each am x 2 days and stop   For drainage / throat tickle try take CHLORPHENIRAMINE  4 mg - take one every 4 hours as needed - available over the counter- may cause drowsiness so start with just a bedtime dose or two an hour before bed  and see how you tolerate it before trying in daytime    GERD (REFLUX)  is an extremely common cause of respiratory symptoms just like yours , many times with no obvious heartburn at all.    It can be treated with medication, but also with lifestyle changes including elevation of the head of your bed (ideally with 6 inch  bed blocks),  Smoking cessation, avoidance of late meals, excessive alcohol, and avoid fatty foods, chocolate, peppermint, colas, red wine, and acidic juices such as orange juice.  NO MINT OR MENTHOL PRODUCTS SO NO COUGH DROPS  USE SUGARLESS CANDY INSTEAD (Jolley ranchers or Stover's or Life Savers) or even ice chips will also do - the key is to swallow to prevent all throat clearing. NO OIL BASED VITAMINS - use powdered substitutes.   For cough try Delsym liquid 2 tsp every 12 hours as needed   If not satisfied return in one month

## 2015-11-30 NOTE — Progress Notes (Signed)
Subjective:    Patient ID: Paula Johnson, female    DOB: 1956-07-04,     MRN: [id number]  HPI  7 yowf never smoker / passive smoke only as child/ healthy until around summer of 2015 with incessant daily cough assoc self referred for cough    11/30/2015 1st [facility] office visit/ Oseias Horsey   Chief Complaint  Patient presents with  . Pulmonary Consult    Self referral. Pt c/o cough for the past 1.5 yrs. She has noticed that cough is triggered by laughing and taking a deep breath. She has recently started on zyrtec and cough has improved. Cough is non prod.   zyrtec/pepcid (taking for itching from chemo for MM)  did seem to help but otherwise daily dry cough x 1.5 years sporadic pattern during the day assoc with urge to clear throat not disturbing sleep     Kouffman Reflux v Neurogenic Cough Differentiator Reflux Comments  Do you awaken from a sound sleep coughing violently?                            With trouble breathing? No   Do you have choking episodes when you cannot  Get enough air, gasping for air ?              sometimes   Do you usually cough when you lie down into  The bed, or when you just lie down to rest ?                          Yes   Do you usually cough after meals or eating?         Yes   Do you cough when (or after) you bend over?    no   GERD SCORE     Kouffman Reflux v Neurogenic Cough Differentiator Neurogenic   Do you more-or-less cough all day long? spells   Does change of temperature make you cough? no   Does laughing or chuckling cause you to cough? YES   Do fumes (perfume, automobile fumes, burned  Toast, etc.,) cause you to cough ?      no   Does speaking, singing, or talking on the phone cause you to cough   ?               YES    Neurogenic/Airway score     Worse on lying down and then resolved until gets out of the shower   No obvious other patterns in day to day or daytime variabilty or assoc sob or cp or chest tightness, subjective wheeze  overt sinus or hb symptoms. No unusual exp hx or h/o childhood pna/ asthma or knowledge of premature birth.  Sleeping ok without nocturnal  or early am exacerbation  of respiratory  c/o's or need for noct saba. Also denies any obvious fluctuation of symptoms with weather or environmental changes or other aggravating or alleviating factors except as outlined above   Current Medications, Allergies, Complete Past Medical History, Past Surgical History, Family History, and Social History were reviewed in [employer] record.              Review of Systems  Constitutional: Negative for fever, chills and unexpected weight change.  HENT: Positive for postnasal drip. Negative for congestion, dental problem, ear pain, nosebleeds, rhinorrhea, sinus pressure, sneezing, sore throat, trouble swallowing and voice  change.   Eyes: Negative for visual disturbance.  Respiratory: Positive for cough. Negative for choking and shortness of breath.   Cardiovascular: Negative for chest pain and leg swelling.  Gastrointestinal: Negative for vomiting, abdominal pain and diarrhea.  Genitourinary: Negative for difficulty urinating.  Musculoskeletal: Negative for arthralgias.  Skin: Negative for rash.  Neurological: Negative for tremors, syncope and headaches.  Hematological: Does not bruise/bleed easily.       Objective:   Physical Exam  amb wf nad  Wt Readings from Last 3 Encounters:  11/30/15 132 lb 12.8 oz (60.238 kg)  11/15/15 131 lb 14.4 oz (59.829 kg)  11/05/15 135 lb 1.6 oz (61.281 kg)    Vital signs reviewed   HEENT: nl dentition, turbinates, and oropharynx. Nl external ear canals without cough reflex   NECK :  without JVD/Nodes/TM/ nl carotid upstrokes bilaterally   LUNGS: no acc muscle use,  Nl contour chest which is clear to A and P bilaterally without cough on insp or exp maneuvers   CV:  RRR  no s3 or murmur or increase in P2, no edema   ABD:  soft and  nontender with nl inspiratory excursion in the supine position. No bruits or organomegaly, bowel sounds nl  MS:  Nl gait/ ext warm without deformities, calf tenderness, cyanosis or clubbing No obvious joint restrictions   SKIN: warm and dry without lesions    NEURO:  alert, approp, nl sensorium with  no motor deficits    I personally reviewed images and agree with radiology impression as follows:  CXR:  09/29/15 No active cardiopulmonary disease.               Assessment & Plan:

## 2015-12-01 ENCOUNTER — Other Ambulatory Visit (HOSPITAL_BASED_OUTPATIENT_CLINIC_OR_DEPARTMENT_OTHER): Payer: BLUE CROSS/BLUE SHIELD

## 2015-12-01 ENCOUNTER — Encounter: Payer: Self-pay | Admitting: Hematology

## 2015-12-01 ENCOUNTER — Telehealth: Payer: Self-pay | Admitting: Hematology

## 2015-12-01 ENCOUNTER — Ambulatory Visit (HOSPITAL_BASED_OUTPATIENT_CLINIC_OR_DEPARTMENT_OTHER): Payer: BLUE CROSS/BLUE SHIELD | Admitting: Hematology

## 2015-12-01 VITALS — BP 121/71 | HR 96 | Temp 98.5°F | Resp 18 | Ht 68.5 in | Wt 132.0 lb

## 2015-12-01 DIAGNOSIS — R05 Cough: Secondary | ICD-10-CM | POA: Diagnosis not present

## 2015-12-01 DIAGNOSIS — D630 Anemia in neoplastic disease: Secondary | ICD-10-CM

## 2015-12-01 DIAGNOSIS — C9000 Multiple myeloma not having achieved remission: Secondary | ICD-10-CM

## 2015-12-01 DIAGNOSIS — D6489 Other specified anemias: Secondary | ICD-10-CM

## 2015-12-01 LAB — CBC & DIFF AND RETIC
BASO%: 0.4 % (ref 0.0–2.0)
BASOS ABS: 0 10*3/uL (ref 0.0–0.1)
EOS ABS: 0 10*3/uL (ref 0.0–0.5)
EOS%: 1.1 % (ref 0.0–7.0)
HEMATOCRIT: 27 % — AB (ref 34.8–46.6)
HEMOGLOBIN: 8.7 g/dL — AB (ref 11.6–15.9)
Immature Retic Fract: 13.1 % — ABNORMAL HIGH (ref 1.60–10.00)
LYMPH%: 38.7 % (ref 14.0–49.7)
MCH: 29.7 pg (ref 25.1–34.0)
MCHC: 32.2 g/dL (ref 31.5–36.0)
MCV: 92.2 fL (ref 79.5–101.0)
MONO#: 0.5 10*3/uL (ref 0.1–0.9)
MONO%: 18.3 % — AB (ref 0.0–14.0)
NEUT%: 41.5 % (ref 38.4–76.8)
NEUTROS ABS: 1.2 10*3/uL — AB (ref 1.5–6.5)
PLATELETS: 150 10*3/uL (ref 145–400)
RBC: 2.93 10*6/uL — ABNORMAL LOW (ref 3.70–5.45)
RDW: 16.6 % — ABNORMAL HIGH (ref 11.2–14.5)
Retic %: 1.08 % (ref 0.70–2.10)
Retic Ct Abs: 31.64 10*3/uL — ABNORMAL LOW (ref 33.70–90.70)
WBC: 2.8 10*3/uL — AB (ref 3.9–10.3)
lymph#: 1.1 10*3/uL (ref 0.9–3.3)

## 2015-12-01 LAB — COMPREHENSIVE METABOLIC PANEL
ALBUMIN: 3.4 g/dL — AB (ref 3.5–5.0)
ALK PHOS: 100 U/L (ref 40–150)
ALT: 21 U/L (ref 0–55)
ANION GAP: 12 meq/L — AB (ref 3–11)
AST: 15 U/L (ref 5–34)
BILIRUBIN TOTAL: 0.63 mg/dL (ref 0.20–1.20)
BUN: 20.8 mg/dL (ref 7.0–26.0)
CALCIUM: 8.7 mg/dL (ref 8.4–10.4)
CO2: 23 mEq/L (ref 22–29)
Chloride: 103 mEq/L (ref 98–109)
Creatinine: 0.8 mg/dL (ref 0.6–1.1)
EGFR: 76 mL/min/{1.73_m2} — AB (ref >90)
Glucose: 94 mg/dl (ref 70–140)
POTASSIUM: 4.2 meq/L (ref 3.5–5.1)
Sodium: 138 mEq/L (ref 136–145)
Total Protein: 7.9 g/dL (ref 6.4–8.3)

## 2015-12-01 NOTE — Telephone Encounter (Signed)
perp of to sch pt appt-pt aware °

## 2015-12-01 NOTE — Progress Notes (Signed)
Marland Kitchen    HEMATOLOGY/ONCOLOGY CONSULTATION NOTE  Date of Service: .12/01/2015  Patient Care Team: [name], MD as PCP - General (Family Medicine)  CHIEF COMPLAINTS/PURPOSE OF CONSULTATION:  Follow up for multiple myeloma  DIAGNOSIS  IgA lambda R-ISS stage II multiple myeloma with innumerable lytic lesions in the calvarium, lesion in T7 vertebra and multiple lesions in the pelvis.  No significant bone pain at this time.  Planned treatment Vd for cycle 1 VRd x 3-4 cycles after Vd Zometa q4weeks Consolidation with Autologous HSCT after treatment  Prophylactic medications - aspirin 325 mg by mouth daily. - Acyclovir 400 mg oral twice daily.  HISTORY OF PRESENTING ILLNESS: Please see my initial consultation for details of her initial presentation.  INTERVAL HISTORY  Paula Johnson is here for her scheduled followup for MM  After having completed her second cycle with Velcade twice weekly and Revlimid plus dexamethasone.  She notes that naproxen has  Prevented fevers from her Velcade shock.  No further skin rashes noted.  Feels a little less focused with Revlimid but has tolerated it well otherwise.  No other acute new concerns at this time.  MEDICAL HISTORY:  Past Medical History  Diagnosis Date  . Prediabetes   . Fibroid     reason for Hysterectomy  . Urinary incontinence   . PONV (postoperative nausea and vomiting)     thinks morphine caused nausea  . Pain aggravated by activities of daily living     states she pulled something at right sternum in dec 2016  . Cancer ([place]) 1999    squamous cell carcinoma of thymus    SURGICAL HISTORY: Past Surgical History  Procedure Laterality Date  . Thymectomy  1999  . Abdominal hysterectomy  1997    [facility]--ovaries remain--Dr. Newton Pigg    SOCIAL HISTORY: Social History   Social History  . Marital Status: Widowed    Spouse Name: N/A  . Number of Children: N/A  . Years of Education: N/A   Occupational History  . Not on  file.   Social History Main Topics  . Smoking status: Never Smoker   . Smokeless tobacco: Never Used  . Alcohol Use: No  . Drug Use: No  . Sexual Activity:    Partners: Male    Birth Control/ Protection: Surgical     Comment: [facility]--ovaries remain   Other Topics Concern  . Not on file   Social History Narrative    FAMILY HISTORY: Family History  Problem Relation Age of Onset  . COPD Father     dec age 34/s-smoked  . Diabetes Brother   . Other Brother     committed suicide age 41  . Hyperlipidemia Mother   . Diabetes Maternal Grandmother     ALLERGIES:  is allergic to ampicillin.  MEDICATIONS:  Current Outpatient Prescriptions  Medication Sig Dispense Refill  . acyclovir (ZOVIRAX) 400 MG tablet Take 1 tablet (400 mg total) by mouth 2 (two) times daily. 60 tablet 3  . aspirin EC 325 MG tablet Take 1 tablet (325 mg total) by mouth daily. 60 tablet 3  . cetirizine (ZYRTEC) 10 MG tablet Take 10 mg by mouth daily.    . cholecalciferol (VITAMIN D) 1000 units tablet Take 2,000 Units by mouth daily.    Marland Kitchen dexamethasone (DECADRON) 4 MG tablet Take 10 tablets (40 mg) on days 1, 8, and 15 of chemo. Repeat every 21 days. 30 tablet 3  . famotidine (PEPCID) 10 MG tablet Take 10 mg by mouth  2 (two) times daily.    Derrill Memo ON 12/06/2015] lenalidomide (REVLIMID) 25 MG capsule Take 1 capsule (25 mg total) by mouth daily. Take 1 capsule on days 1-14. Repeat every 21 days. Authorization # [id number] 14 capsule 0  . LORazepam (ATIVAN) 0.5 MG tablet Take 1 tablet (0.5 mg total) by mouth every 6 (six) hours as needed (Nausea or vomiting). 30 tablet 0  . ondansetron (ZOFRAN) 8 MG tablet Take 1 tablet (8 mg total) by mouth 2 (two) times daily as needed (Nausea or vomiting). 30 tablet 1  . pantoprazole (PROTONIX) 40 MG tablet Take 1 tablet (40 mg total) by mouth daily. Take 30-60 min before first meal of the day 30 tablet 2  . predniSONE (DELTASONE) 10 MG tablet Take  4 each am x 2 days,   2 each am x 2  days,  1 each am x 2 days and stop 14 tablet 0  . prochlorperazine (COMPAZINE) 10 MG tablet Take 1 tablet (10 mg total) by mouth every 6 (six) hours as needed (Nausea or vomiting). 30 tablet 1  . senna-docusate (SENNA S) 8.6-50 MG tablet Take 2 tablets by mouth at bedtime. 60 tablet 1   No current facility-administered medications for this visit.    REVIEW OF SYSTEMS:    10 Point review of Systems was done is negative except as noted above.  PHYSICAL EXAMINATION: ECOG PERFORMANCE STATUS: 1 - Symptomatic but completely ambulatory  . Filed Vitals:   12/01/15 1522  BP: 121/71  Pulse: 96  Temp: 98.5 F (36.9 C)  Resp: 18   Filed Weights   12/01/15 1522  Weight: 132 lb (59.875 kg)   .Body mass index is 19.78 kg/(m^2).  GENERAL:alert, in no acute distress and comfortable SKIN: skin color, texture, turgor are normal, no rashes or significant lesions EYES: normal, conjunctiva are pink and non-injected, sclera clear OROPHARYNX:no exudate, no erythema and lips, buccal mucosa, and tongue normal  NECK: supple, no JVD, thyroid normal size, non-tender, without nodularity LYMPH:  no palpable lymphadenopathy in the cervical, axillary or inguinal LUNGS: clear to auscultation with normal respiratory effort HEART: regular rate & rhythm,  no murmurs and no lower extremity edema ABDOMEN: abdomen soft, non-tender, normoactive bowel sounds  Musculoskeletal: no cyanosis of digits and no clubbing  PSYCH: alert & oriented x 3 with fluent speech NEURO: no focal motor/sensory deficits  LABORATORY DATA:  I have reviewed the data as listed   Outside labs SPEP with 2 monoclonal bands 3.7 g/dL and the second band 1.2 g/dL (total 4.9 g/dl M protein). IFE Showed monoclonal IgA lambda protein.   LDH 141  Sedimentation rate 143 Beta-2 microglobulin 4.85 Albumin 3.5       CYTOGENETICS:  About 75% clone of plasma cells showing t(11:14) mutation consistent with multiple myeloma on  FISH  RADIOGRAPHIC STUDIES: I have personally reviewed the radiological images as listed and agreed with the findings in the report. Ct Abdomen Pelvis Wo Contrast  11/05/2015  CLINICAL DATA:  Gross hematuria. Fever and chills. Multiple myeloma. EXAM: CT ABDOMEN AND PELVIS WITHOUT CONTRAST TECHNIQUE: Multidetector CT imaging of the abdomen and pelvis was performed following the standard protocol without IV contrast. COMPARISON:  PET-CT on 10/12/2015 FINDINGS: Lower chest:  No acute findings. Hepatobiliary: No mass visualized on this un-enhanced exam. Gallbladder is unremarkable. Pancreas: No mass or inflammatory process identified on this un-enhanced exam. Spleen: Within normal limits in size. Adrenals/Urinary Tract: A 2 mm nonobstructive calculus is again seen in the upper pole of  the left kidney. No right renal calculi identified. No evidence of ureteral calculi or hydronephrosis involving either kidney. No bladder calculi identified. Stomach/Bowel: No evidence of obstruction, inflammatory process, or abnormal fluid collections. Large colonic stool burden again noted. Vascular/Lymphatic: No pathologically enlarged lymph nodes. No evidence of abdominal aortic aneurysm. Reproductive: Prior hysterectomy again noted. Adnexal regions are unremarkable. Vaginal pessary again noted. Other: None. Musculoskeletal: Multiple small lucent bone lesions again seen throughout the visualized skeleton, without significant change. This is consistent with patient's known multiple myeloma. No pathologic fractures identified. IMPRESSION: Tiny nonobstructive left renal calculus. No evidence of ureteral calculi, hydronephrosis, or other acute findings. Large stool burden again noted; suggest clinical correlation for possible constipation. No significant change and widespread small lytic bone lesions, consistent with known multiple myeloma. Electronically Signed   By: Earle Gell M.D.   On: 11/05/2015 14:57   Ct Renal Stone  Study  11/20/2015  CLINICAL DATA:  Right flank pain EXAM: CT ABDOMEN AND PELVIS WITHOUT CONTRAST TECHNIQUE: Multidetector CT imaging of the abdomen and pelvis was performed following the standard protocol without IV contrast. COMPARISON:  11/05/2015 FINDINGS: The lung bases are free of acute infiltrate or sizable effusion. The liver, gallbladder, spleen, adrenal glands and pancreas are within normal limits. Ingested material is noted within the proximal duodenum. The left kidney again demonstrates some nonobstructing renal calculi. No ureteral stones are noted. The right kidney shows no obstructive change or renal calculi. The bladder is partially distended. A cervical cerclage ring is noted. Persistent fecal material is noted throughout the colon consistent with a degree of constipation. The appendix is within normal limits. The uterus has been surgically removed. No pelvic mass lesion is noted. No acute bony abnormality is seen. Scattered spinal lucencies are again identified throughout the bony structures consistent with the given clinical history of multiple myeloma. IMPRESSION: Stable nonobstructing left renal calculi. Stable changes of multiple myeloma. Large stool burden consistent with constipation stable from the prior study. Electronically Signed   By: Inez Catalina M.D.   On: 11/20/2015 14:39    ASSESSMENT & PLAN:    Very pleasant 60 year old Caucasian female with  1) Newly diagnosed R-ISS Stage II IgA lambda multiple myeloma with standard risk cytogenetics t (11;14). PET/CT shows evidence of lytic bone lesions in the calvarium, iliac bones  and T7. No hypercalcemia or renal failure at this time. Pretreatment M spike of 5 in 2 monoclonal bands. BM bx with 69% plasma cells with Lambda light chain restriction Patient is status post cycle 1 of Vd and 1 cycle of VRd and has she tolerated well other than some grade 1 rash. Also noted to have fevers from Velcade which are controlled with naproxen.    #2 anemia due to multiple myeloma- gradually improving #3 fatigue due to multiple myeloma and Anemia -improving #4 multiple bone metastases from multiple myeloma. - On Zometa no bone pains currently. #5 Mild B/L thigh macular rash likely from Velcade - resolved. Has been taking antihistamines and is using dexamethasone pre-Velcade as instructed and this seems to have resolved the rash. Plan -- Patient's labs are stable. No  Prohibitive toxicities -- M protein has improved from 5 to 2.8 after 2 cycles of treatment. -- continue with treatment as per current plan. --Zometa every 4 weekly for bone metastases. -Aspirin daily for VTE prophylaxis with Revlimid -acyclovir for shingles prophylaxis ( patient notes that she did get a shingles vaccine about 2 years ago). -Would plan to do 4 cycles of VRd and  then consider consolidation with an Autologus HSCT. -counseled on need for good hydration and focus on good po intake. -OTC Cetrizine and Famotidine for rash. May use topical hydrocortisone for localized rash.  - we'll give the patient had early referral to Dr. Samule Ohm at [facility] so that she has adequate time to setup the patient for consideration of Auto HSCT. -patient would need a Rpt PET/CT and BM Bx post treatment prior to HD ctx + Auto HSCT -- would allow transplant team to have this done.   #6 chronic cough likely related to postnasal drip vs GERD.  Has improved with Zyrtec and Pepcid.  Plan - continue antihistamines - saline nasal spray - steam inhalation. - consider Flonase if required.  RTC with Dr Irene Limbo in 2 weeks for continued followup. Cbc, cmp Weekly and myeloma panel q 3weeks before each cycle of treatment.  I spent 25 minutes counseling the patient face to face. The total time spent in the appointment was 30 minutes and more than 50% was on counseling and direct patient cares.    Sullivan Lone MD [place] AAHIVMS [facility] Hematology/Oncology Physician [facility]  (Office):       [phone] (Work cell):  [phone] (Fax):           [phone]

## 2015-12-01 NOTE — Assessment & Plan Note (Signed)
The most common causes of chronic cough in immunocompetent adults include the following: upper airway cough syndrome (UACS), previously referred to as postnasal drip syndrome (PNDS), which is caused by variety of rhinosinus conditions; (2) asthma; (3) GERD; (4) chronic bronchitis from cigarette smoking or other inhaled environmental irritants; (5) nonasthmatic eosinophilic bronchitis; and (6) bronchiectasis.   These conditions, singly or in combination, have accounted for up to 94% of the causes of chronic cough in prospective studies.   Other conditions have constituted no >6% of the causes in prospective studies These have included bronchogenic carcinoma, chronic interstitial pneumonia, sarcoidosis, left ventricular failure, ACEI-induced cough, and aspiration from a condition associated with pharyngeal dysfunction.    Chronic cough is often simultaneously caused by more than one condition. A single cause has been found from 38 to 82% of the time, multiple causes from 18 to 62%. Multiply caused cough has been the result of three diseases up to 42% of the time.       Based on hx and exam, this is most likely:  Classic Upper airway cough syndrome, so named because it's frequently impossible to sort out how much is  CR/sinusitis with freq throat clearing (which can be related to primary GERD)   vs  causing  secondary (" extra esophageal")  GERD from wide swings in gastric pressure that occur with throat clearing, often  promoting self use of mint and menthol lozenges that reduce the lower esophageal sphincter tone and exacerbate the problem further in a cyclical fashion.   These are the same pts (now being labeled as having "irritable larynx syndrome" by some cough centers) who not infrequently have a history of having failed to tolerate ace inhibitors,  dry powder inhalers or biphosphonates or report having atypical reflux symptoms that don't respond to standard doses of PPI , and are easily confused as  having aecopd or asthma flares by even experienced allergists/ pulmonologists.   The first step is to maximize GERD rx  and eliminate pnds with H1 if tolerates and use hard rock candy for cyclical coughing then regroup if the cough persists in 4 weeks allergy eval and ? Trial of neurontin next for she surely has a component of neurogenic cough as well.  I had an extended discussion with the patient reviewing all relevant studies completed to date and  lasting 35 min  1) Explained: The standardized cough guidelines published in Chest by Lissa Morales in 2006 are still the best available and consist of a multiple step process (up to 12!) , not a single office visit,  and are intended  to address this problem logically,  with an alogrithm dependent on response to empiric treatment at  each progressive step  to determine a specific diagnosis with  minimal addtional testing needed. Therefore if adherence is an issue or can't be accurately verified,  it's very unlikely the standard evaluation and treatment will be successful here.    Furthermore, response to therapy (other than acute cough suppression, which should only be used short term with avoidance of narcotic containing cough syrups if possible), can be a gradual process for which the patient may perceive immediate benefit.  Unlike going to an eye doctor where the best perscription is almost always the first one and is immediately effective, this is almost never the case in the management of chronic cough syndromes. Therefore the patient needs to commit up front to consistently adhere to recommendations  for up to 6 weeks of therapy directed at the  likely underlying problem(s) before the response can be reasonably evaluated.     2) Each maintenance medication was reviewed in detail including most importantly the difference between maintenance and prns and under what circumstances the prns are to be triggered using an action plan format that is not  reflected in the computer generated alphabetically organized AVS.    Please see instructions for details which were reviewed in writing and the patient given a copy highlighting the part that I personally wrote and discussed at today's ov.   See instructions for specific recommendations which were reviewed directly with the patient who was given a copy with highlighter outlining the key components.

## 2015-12-03 ENCOUNTER — Telehealth: Payer: Self-pay | Admitting: Pharmacist

## 2015-12-03 ENCOUNTER — Ambulatory Visit: Payer: Self-pay

## 2015-12-03 NOTE — Telephone Encounter (Signed)
12/03/15: Attempted to reach patient for follow up on oral medication: Revlimid/Velcade. No answer. Left VM for patient to call back with any questions or issues.   Thank you,  Montel Clock, PharmD, [facility] [phone]

## 2015-12-06 ENCOUNTER — Ambulatory Visit: Payer: Self-pay

## 2015-12-06 ENCOUNTER — Other Ambulatory Visit: Payer: Self-pay

## 2015-12-07 ENCOUNTER — Other Ambulatory Visit (HOSPITAL_BASED_OUTPATIENT_CLINIC_OR_DEPARTMENT_OTHER): Payer: BLUE CROSS/BLUE SHIELD

## 2015-12-07 ENCOUNTER — Ambulatory Visit (HOSPITAL_BASED_OUTPATIENT_CLINIC_OR_DEPARTMENT_OTHER): Payer: BLUE CROSS/BLUE SHIELD

## 2015-12-07 VITALS — BP 104/62 | HR 88 | Resp 18

## 2015-12-07 DIAGNOSIS — C9000 Multiple myeloma not having achieved remission: Secondary | ICD-10-CM | POA: Diagnosis not present

## 2015-12-07 DIAGNOSIS — Z5112 Encounter for antineoplastic immunotherapy: Secondary | ICD-10-CM | POA: Diagnosis not present

## 2015-12-07 LAB — COMPREHENSIVE METABOLIC PANEL
ALT: 16 U/L (ref 0–55)
ANION GAP: 10 meq/L (ref 3–11)
AST: 14 U/L (ref 5–34)
Albumin: 3.6 g/dL (ref 3.5–5.0)
Alkaline Phosphatase: 118 U/L (ref 40–150)
BILIRUBIN TOTAL: 0.52 mg/dL (ref 0.20–1.20)
BUN: 16.5 mg/dL (ref 7.0–26.0)
CO2: 22 meq/L (ref 22–29)
CREATININE: 0.8 mg/dL (ref 0.6–1.1)
Calcium: 8.8 mg/dL (ref 8.4–10.4)
Chloride: 107 mEq/L (ref 98–109)
EGFR: 83 mL/min/{1.73_m2} — ABNORMAL LOW (ref >90)
Glucose: 145 mg/dl — ABNORMAL HIGH (ref 70–140)
Potassium: 4.6 mEq/L (ref 3.5–5.1)
Sodium: 139 mEq/L (ref 136–145)
TOTAL PROTEIN: 8 g/dL (ref 6.4–8.3)

## 2015-12-07 LAB — CBC & DIFF AND RETIC
BASO%: 0 % (ref 0.0–2.0)
BASOS ABS: 0 10*3/uL (ref 0.0–0.1)
EOS ABS: 0 10*3/uL (ref 0.0–0.5)
EOS%: 0 % (ref 0.0–7.0)
HEMATOCRIT: 26.7 % — AB (ref 34.8–46.6)
HGB: 8.8 g/dL — ABNORMAL LOW (ref 11.6–15.9)
IMMATURE RETIC FRACT: 13.8 % — AB (ref 1.60–10.00)
LYMPH#: 0.4 10*3/uL — AB (ref 0.9–3.3)
LYMPH%: 16.2 % (ref 14.0–49.7)
MCH: 30.1 pg (ref 25.1–34.0)
MCHC: 33 g/dL (ref 31.5–36.0)
MCV: 91.4 fL (ref 79.5–101.0)
MONO#: 0.1 10*3/uL (ref 0.1–0.9)
MONO%: 5.1 % (ref 0.0–14.0)
NEUT#: 1.8 10*3/uL (ref 1.5–6.5)
NEUT%: 78.7 % — AB (ref 38.4–76.8)
PLATELETS: 445 10*3/uL — AB (ref 145–400)
RBC: 2.92 10*6/uL — AB (ref 3.70–5.45)
RDW: 16.9 % — ABNORMAL HIGH (ref 11.2–14.5)
RETIC CT ABS: 40 10*3/uL (ref 33.70–90.70)
Retic %: 1.37 % (ref 0.70–2.10)
WBC: 2.3 10*3/uL — ABNORMAL LOW (ref 3.9–10.3)

## 2015-12-07 MED ORDER — PROCHLORPERAZINE MALEATE 10 MG PO TABS
ORAL_TABLET | ORAL | Status: AC
  Filled 2015-12-07: qty 1

## 2015-12-07 MED ORDER — PROCHLORPERAZINE MALEATE 10 MG PO TABS
10.0000 mg | ORAL_TABLET | Freq: Once | ORAL | Status: AC
  Administered 2015-12-07: 10 mg via ORAL

## 2015-12-07 MED ORDER — BORTEZOMIB CHEMO SQ INJECTION 3.5 MG (2.5MG/ML)
1.3000 mg/m2 | Freq: Once | INTRAMUSCULAR | Status: AC
  Administered 2015-12-07: 2.25 mg via SUBCUTANEOUS
  Filled 2015-12-07: qty 2.25

## 2015-12-07 NOTE — Patient Instructions (Signed)
[facility] Discharge Instructions for Patients Receiving Chemotherapy  Today you received the following chemotherapy agents: Velcade.  To help prevent nausea and vomiting after your treatment, we encourage you to take your nausea medication: Lorazepam, Compazine and Zofran as directed.   If you develop nausea and vomiting that is not controlled by your nausea medication, call the clinic.   BELOW ARE SYMPTOMS THAT SHOULD BE REPORTED IMMEDIATELY:  *FEVER GREATER THAN 100.5 F  *CHILLS WITH OR WITHOUT FEVER  NAUSEA AND VOMITING THAT IS NOT CONTROLLED WITH YOUR NAUSEA MEDICATION  *UNUSUAL SHORTNESS OF BREATH  *UNUSUAL BRUISING OR BLEEDING  TENDERNESS IN MOUTH AND THROAT WITH OR WITHOUT PRESENCE OF ULCERS  *URINARY PROBLEMS  *BOWEL PROBLEMS  UNUSUAL RASH Items with * indicate a potential emergency and should be followed up as soon as possible.  Feel free to call the clinic you have any questions or concerns. The clinic phone number is [phone].  Please show the [place] at check-in to the Emergency Department and triage nurse.  Zoledronic Acid injection (Paget's Disease, Osteoporosis) What is this medicine? ZOLEDRONIC ACID (ZOE le dron ik AS id) lowers the amount of calcium loss from bone. It is used to treat Paget's disease and osteoporosis in women. This medicine may be used for other purposes; ask your health care provider or pharmacist if you have questions. What should I tell my health care provider before I take this medicine? They need to know if you have any of these conditions: -aspirin-sensitive asthma -cancer, especially if you are receiving medicines used to treat cancer -dental disease or wear dentures -infection -kidney disease -low levels of calcium in the blood -past surgery on the parathyroid gland or intestines -receiving corticosteroids like dexamethasone or prednisone -an unusual or allergic reaction to zoledronic acid, other  medicines, foods, dyes, or preservatives -pregnant or trying to get pregnant -breast-feeding How should I use this medicine? This medicine is for infusion into a vein. It is given by a health care professional in a hospital or clinic setting. Talk to your pediatrician regarding the use of this medicine in children. This medicine is not approved for use in children. Overdosage: If you think you have taken too much of this medicine contact a poison control center or emergency room at once. NOTE: This medicine is only for you. Do not share this medicine with others. What if I miss a dose? It is important not to miss your dose. Call your doctor or health care professional if you are unable to keep an appointment. What may interact with this medicine? -certain antibiotics given by injection -NSAIDs, medicines for pain and inflammation, like ibuprofen or naproxen -some diuretics like bumetanide, furosemide -teriparatide This list may not describe all possible interactions. Give your health care provider a list of all the medicines, herbs, non-prescription drugs, or dietary supplements you use. Also tell them if you smoke, drink alcohol, or use illegal drugs. Some items may interact with your medicine. What should I watch for while using this medicine? Visit your doctor or health care professional for regular checkups. It may be some time before you see the benefit from this medicine. Do not stop taking your medicine unless your doctor tells you to. Your doctor may order blood tests or other tests to see how you are doing. Women should inform their doctor if they wish to become pregnant or think they might be pregnant. There is a potential for serious side effects to an unborn child. Talk  to your health care professional or pharmacist for more information. You should make sure that you get enough calcium and vitamin D while you are taking this medicine. Discuss the foods you eat and the vitamins you take  with your health care professional. Some people who take this medicine have severe bone, joint, and/or muscle pain. This medicine may also increase your risk for jaw problems or a broken thigh bone. Tell your doctor right away if you have severe pain in your jaw, bones, joints, or muscles. Tell your doctor if you have any pain that does not go away or that gets worse. Tell your dentist and dental surgeon that you are taking this medicine. You should not have major dental surgery while on this medicine. See your dentist to have a dental exam and fix any dental problems before starting this medicine. Take good care of your teeth while on this medicine. Make sure you see your dentist for regular follow-up appointments. What side effects may I notice from receiving this medicine? Side effects that you should report to your doctor or health care professional as soon as possible: -allergic reactions like skin rash, itching or hives, swelling of the face, lips, or tongue -anxiety, confusion, or depression -breathing problems -changes in vision -eye pain -feeling faint or lightheaded, falls -jaw pain, especially after dental work -mouth sores -muscle cramps, stiffness, or weakness -redness, blistering, peeling or loosening of the skin, including inside the mouth -trouble passing urine or change in the amount of urine Side effects that usually do not require medical attention (report to your doctor or health care professional if they continue or are bothersome): -bone, joint, or muscle pain -constipation -diarrhea -fever -hair loss -irritation at site where injected -loss of appetite -nausea, vomiting -stomach upset -trouble sleeping -trouble swallowing -weak or tired This list may not describe all possible side effects. Call your doctor for medical advice about side effects. You may report side effects to FDA at 1-800-FDA-1088. Where should I keep my medicine? This drug is given in a hospital  or clinic and will not be stored at home. NOTE: This sheet is a summary. It may not cover all possible information. If you have questions about this medicine, talk to your doctor, pharmacist, or health care provider.    2016, Elsevier/Gold Standard. (2014-02-07 14:19:57)

## 2015-12-09 ENCOUNTER — Ambulatory Visit: Payer: Self-pay

## 2015-12-09 ENCOUNTER — Other Ambulatory Visit: Payer: Self-pay

## 2015-12-10 ENCOUNTER — Other Ambulatory Visit: Payer: Self-pay

## 2015-12-10 ENCOUNTER — Ambulatory Visit (HOSPITAL_BASED_OUTPATIENT_CLINIC_OR_DEPARTMENT_OTHER): Payer: BLUE CROSS/BLUE SHIELD

## 2015-12-10 VITALS — BP 127/74 | HR 92 | Temp 97.9°F | Resp 16

## 2015-12-10 DIAGNOSIS — Z5112 Encounter for antineoplastic immunotherapy: Secondary | ICD-10-CM

## 2015-12-10 DIAGNOSIS — C9000 Multiple myeloma not having achieved remission: Secondary | ICD-10-CM | POA: Diagnosis not present

## 2015-12-10 MED ORDER — BORTEZOMIB CHEMO SQ INJECTION 3.5 MG (2.5MG/ML)
1.3000 mg/m2 | Freq: Once | INTRAMUSCULAR | Status: AC
  Administered 2015-12-10: 2.25 mg via SUBCUTANEOUS
  Filled 2015-12-10: qty 2.25

## 2015-12-10 MED ORDER — PROCHLORPERAZINE MALEATE 10 MG PO TABS
ORAL_TABLET | ORAL | Status: AC
  Filled 2015-12-10: qty 1

## 2015-12-10 MED ORDER — PROCHLORPERAZINE MALEATE 10 MG PO TABS
10.0000 mg | ORAL_TABLET | Freq: Once | ORAL | Status: AC
  Administered 2015-12-10: 10 mg via ORAL

## 2015-12-10 NOTE — Patient Instructions (Signed)
[facility] Discharge Instructions for Patients Receiving Chemotherapy  Today you received the following chemotherapy agents velcade   To help prevent nausea and vomiting after your treatment, we encourage you to take your nausea medication as directed  If you develop nausea and vomiting that is not controlled by your nausea medication, call the clinic.   BELOW ARE SYMPTOMS THAT SHOULD BE REPORTED IMMEDIATELY:  *FEVER GREATER THAN 100.5 F  *CHILLS WITH OR WITHOUT FEVER  NAUSEA AND VOMITING THAT IS NOT CONTROLLED WITH YOUR NAUSEA MEDICATION  *UNUSUAL SHORTNESS OF BREATH  *UNUSUAL BRUISING OR BLEEDING  TENDERNESS IN MOUTH AND THROAT WITH OR WITHOUT PRESENCE OF ULCERS  *URINARY PROBLEMS  *BOWEL PROBLEMS  UNUSUAL RASH Items with * indicate a potential emergency and should be followed up as soon as possible.  Feel free to call the clinic you have any questions or concerns. The clinic phone number is [phone].  

## 2015-12-13 ENCOUNTER — Ambulatory Visit: Payer: Self-pay

## 2015-12-14 ENCOUNTER — Encounter: Payer: Self-pay | Admitting: Hematology

## 2015-12-14 ENCOUNTER — Other Ambulatory Visit (HOSPITAL_BASED_OUTPATIENT_CLINIC_OR_DEPARTMENT_OTHER): Payer: BLUE CROSS/BLUE SHIELD

## 2015-12-14 ENCOUNTER — Ambulatory Visit (HOSPITAL_BASED_OUTPATIENT_CLINIC_OR_DEPARTMENT_OTHER): Payer: BLUE CROSS/BLUE SHIELD

## 2015-12-14 ENCOUNTER — Ambulatory Visit (HOSPITAL_BASED_OUTPATIENT_CLINIC_OR_DEPARTMENT_OTHER): Payer: BLUE CROSS/BLUE SHIELD | Admitting: Hematology

## 2015-12-14 ENCOUNTER — Other Ambulatory Visit: Payer: Self-pay

## 2015-12-14 VITALS — BP 116/70 | HR 87 | Temp 98.2°F | Resp 18 | Ht 68.5 in | Wt 132.0 lb

## 2015-12-14 DIAGNOSIS — R530 Neoplastic (malignant) related fatigue: Secondary | ICD-10-CM

## 2015-12-14 DIAGNOSIS — D6481 Anemia due to antineoplastic chemotherapy: Secondary | ICD-10-CM | POA: Diagnosis not present

## 2015-12-14 DIAGNOSIS — C9000 Multiple myeloma not having achieved remission: Secondary | ICD-10-CM

## 2015-12-14 DIAGNOSIS — Z5112 Encounter for antineoplastic immunotherapy: Secondary | ICD-10-CM | POA: Diagnosis not present

## 2015-12-14 DIAGNOSIS — D630 Anemia in neoplastic disease: Secondary | ICD-10-CM

## 2015-12-14 DIAGNOSIS — R21 Rash and other nonspecific skin eruption: Secondary | ICD-10-CM

## 2015-12-14 DIAGNOSIS — R05 Cough: Secondary | ICD-10-CM

## 2015-12-14 LAB — CBC & DIFF AND RETIC
BASO%: 0.4 % (ref 0.0–2.0)
BASOS ABS: 0 10*3/uL (ref 0.0–0.1)
EOS ABS: 0 10*3/uL (ref 0.0–0.5)
EOS%: 0 % (ref 0.0–7.0)
HEMATOCRIT: 27.9 % — AB (ref 34.8–46.6)
HGB: 9 g/dL — ABNORMAL LOW (ref 11.6–15.9)
Immature Retic Fract: 14.3 % — ABNORMAL HIGH (ref 1.60–10.00)
LYMPH#: 0.4 10*3/uL — AB (ref 0.9–3.3)
LYMPH%: 15.4 % (ref 14.0–49.7)
MCH: 29.9 pg (ref 25.1–34.0)
MCHC: 32.3 g/dL (ref 31.5–36.0)
MCV: 92.7 fL (ref 79.5–101.0)
MONO#: 0 10*3/uL — ABNORMAL LOW (ref 0.1–0.9)
MONO%: 0.7 % (ref 0.0–14.0)
NEUT%: 83.5 % — ABNORMAL HIGH (ref 38.4–76.8)
NEUTROS ABS: 2.2 10*3/uL (ref 1.5–6.5)
Platelets: 320 10*3/uL (ref 145–400)
RBC: 3.01 10*6/uL — ABNORMAL LOW (ref 3.70–5.45)
RDW: 17.5 % — AB (ref 11.2–14.5)
RETIC %: 2.82 % — AB (ref 0.70–2.10)
RETIC CT ABS: 84.88 10*3/uL (ref 33.70–90.70)
WBC: 2.7 10*3/uL — AB (ref 3.9–10.3)

## 2015-12-14 LAB — COMPREHENSIVE METABOLIC PANEL
ALK PHOS: 113 U/L (ref 40–150)
ALT: 24 U/L (ref 0–55)
AST: 20 U/L (ref 5–34)
Albumin: 3.9 g/dL (ref 3.5–5.0)
Anion Gap: 9 mEq/L (ref 3–11)
BILIRUBIN TOTAL: 0.78 mg/dL (ref 0.20–1.20)
BUN: 17.4 mg/dL (ref 7.0–26.0)
CALCIUM: 8.7 mg/dL (ref 8.4–10.4)
CO2: 24 mEq/L (ref 22–29)
Chloride: 107 mEq/L (ref 98–109)
Creatinine: 0.7 mg/dL (ref 0.6–1.1)
EGFR: 87 mL/min/{1.73_m2} — AB (ref >90)
GLUCOSE: 152 mg/dL — AB (ref 70–140)
Potassium: 4.2 mEq/L (ref 3.5–5.1)
SODIUM: 140 meq/L (ref 136–145)
Total Protein: 7.7 g/dL (ref 6.4–8.3)

## 2015-12-14 MED ORDER — BORTEZOMIB CHEMO SQ INJECTION 3.5 MG (2.5MG/ML)
1.3000 mg/m2 | Freq: Once | INTRAMUSCULAR | Status: AC
  Administered 2015-12-14: 2.25 mg via SUBCUTANEOUS
  Filled 2015-12-14: qty 2.25

## 2015-12-14 MED ORDER — PROCHLORPERAZINE MALEATE 10 MG PO TABS
10.0000 mg | ORAL_TABLET | Freq: Once | ORAL | Status: AC
  Administered 2015-12-14: 10 mg via ORAL

## 2015-12-14 MED ORDER — LENALIDOMIDE 25 MG PO CAPS: 25.0000 mg | ORAL_CAPSULE | Freq: Every day | ORAL | Status: DC

## 2015-12-14 MED ORDER — PROCHLORPERAZINE MALEATE 10 MG PO TABS
ORAL_TABLET | ORAL | Status: AC
  Filled 2015-12-14: qty 1

## 2015-12-14 NOTE — Patient Instructions (Signed)
[facility] Discharge Instructions for Patients Receiving Chemotherapy  Today you received the following chemotherapy agents velcade   To help prevent nausea and vomiting after your treatment, we encourage you to take your nausea medication as directed  If you develop nausea and vomiting that is not controlled by your nausea medication, call the clinic.   BELOW ARE SYMPTOMS THAT SHOULD BE REPORTED IMMEDIATELY:  *FEVER GREATER THAN 100.5 F  *CHILLS WITH OR WITHOUT FEVER  NAUSEA AND VOMITING THAT IS NOT CONTROLLED WITH YOUR NAUSEA MEDICATION  *UNUSUAL SHORTNESS OF BREATH  *UNUSUAL BRUISING OR BLEEDING  TENDERNESS IN MOUTH AND THROAT WITH OR WITHOUT PRESENCE OF ULCERS  *URINARY PROBLEMS  *BOWEL PROBLEMS  UNUSUAL RASH Items with * indicate a potential emergency and should be followed up as soon as possible.  Feel free to call the clinic you have any questions or concerns. The clinic phone number is [phone].  

## 2015-12-14 NOTE — Progress Notes (Signed)
Patient states she took her decadron at home this morning.

## 2015-12-15 LAB — KAPPA/LAMBDA LIGHT CHAINS
Ig Kappa Free Light Chain: 10.22 mg/L (ref 3.30–19.40)
Ig Lambda Free Light Chain: 39.18 mg/L — ABNORMAL HIGH (ref 5.71–26.30)
Kappa/Lambda FluidC Ratio: 0.26 (ref 0.26–1.65)

## 2015-12-15 NOTE — Progress Notes (Signed)
Marland Kitchen    HEMATOLOGY/ONCOLOGY CLINIC NOTE  Date of Service: .12/14/2015   Patient Care Team: [name], MD as PCP - General (Family Medicine)  CHIEF COMPLAINTS/PURPOSE OF CONSULTATION:  Follow up for multiple myeloma  DIAGNOSIS  IgA lambda R-ISS stage II multiple myeloma with innumerable lytic lesions in the calvarium, lesion in T7 vertebra and multiple lesions in the pelvis.  No significant bone pain at this time.  Current treatment Current C3 D8 of treatment.  Planned treatment Vd for cycle 1 VRd x 3-4 cycles after Vd Zometa q4weeks Consolidation with Autologous HSCT after treatment  Prophylactic medications - aspirin 325 mg by mouth daily. - Acyclovir 400 mg oral twice daily.  HISTORY OF PRESENTING ILLNESS: Please see my initial consultation for details of her initial presentation.  INTERVAL HISTORY  Paula Johnson is here for her scheduled followup for MM .   She is here for her cycle 3 day 8 of treatment.  Notes some new mild rash on her abdomen bilaterally which is improving.  With significant improvement in her rash . She continues to be on her antihistamines and has been using some topical hydrocortisone with improvement in her rash.  grade 2 fatigue but is working full-time. No fevers/chills -controlled with the use of naproxen. Notes no tingling and numbness in her hands and feet. Notes a little bit of moodiness. No other acute new concerns.   No dental ulcers or other issues. Continue to receive Zometa q 4weeks without any issues.  MEDICAL HISTORY:  Past Medical History  Diagnosis Date  . Prediabetes   . Fibroid     reason for Hysterectomy  . Urinary incontinence   . PONV (postoperative nausea and vomiting)     thinks morphine caused nausea  . Pain aggravated by activities of daily living     states she pulled something at right sternum in dec 2016  . Cancer ([place]) 1999    squamous cell carcinoma of thymus    SURGICAL HISTORY: Past Surgical History    Procedure Laterality Date  . Thymectomy  1999  . Abdominal hysterectomy  1997    TAH--ovaries remain--Dr. Newton Pigg    SOCIAL HISTORY: Social History   Social History  . Marital Status: Widowed    Spouse Name: N/A  . Number of Children: N/A  . Years of Education: N/A   Occupational History  . Not on file.   Social History Main Topics  . Smoking status: Never Smoker   . Smokeless tobacco: Never Used  . Alcohol Use: No  . Drug Use: No  . Sexual Activity:    Partners: Male    Birth Control/ Protection: Surgical     Comment: TAH--ovaries remain   Other Topics Concern  . Not on file   Social History Narrative    FAMILY HISTORY: Family History  Problem Relation Age of Onset  . COPD Father     dec age 97/s-smoked  . Diabetes Brother   . Other Brother     committed suicide age 67  . Hyperlipidemia Mother   . Diabetes Maternal Grandmother     ALLERGIES:  is allergic to ampicillin.  MEDICATIONS:  Current Outpatient Prescriptions  Medication Sig Dispense Refill  . acyclovir (ZOVIRAX) 400 MG tablet Take 1 tablet (400 mg total) by mouth 2 (two) times daily. 60 tablet 3  . aspirin EC 325 MG tablet Take 1 tablet (325 mg total) by mouth daily. 60 tablet 3  . cetirizine (ZYRTEC) 10 MG tablet Take 10  mg by mouth daily.    . cholecalciferol (VITAMIN D) 1000 units tablet Take 2,000 Units by mouth daily.    Marland Kitchen dexamethasone (DECADRON) 4 MG tablet Take 10 tablets (40 mg) on days 1, 8, and 15 of chemo. Repeat every 21 days. (Patient taking differently: Take 5 tablets (20 mg) on days 1, 8, and 15 of chemo. Repeat every 21 days.) 30 tablet 3  . famotidine (PEPCID) 10 MG tablet Take 10 mg by mouth 2 (two) times daily.    Marland Kitchen lenalidomide (REVLIMID) 25 MG capsule Take 1 capsule (25 mg total) by mouth daily. Take 1 capsule on days 1-14. Repeat every 21 days. Authorization # [id number] 22 capsule 1  . LORazepam (ATIVAN) 0.5 MG tablet Take 1 tablet (0.5 mg total) by mouth every 6 (six)  hours as needed (Nausea or vomiting). 30 tablet 0  . ondansetron (ZOFRAN) 8 MG tablet Take 1 tablet (8 mg total) by mouth 2 (two) times daily as needed (Nausea or vomiting). 30 tablet 1  . prochlorperazine (COMPAZINE) 10 MG tablet Take 1 tablet (10 mg total) by mouth every 6 (six) hours as needed (Nausea or vomiting). 30 tablet 1  . senna-docusate (SENNA S) 8.6-50 MG tablet Take 2 tablets by mouth at bedtime. 60 tablet 1   No current facility-administered medications for this visit.    REVIEW OF SYSTEMS:    10 Point review of Systems was done is negative except as noted above.  PHYSICAL EXAMINATION: ECOG PERFORMANCE STATUS: 1 - Symptomatic but completely ambulatory  . Filed Vitals:   12/14/15 1521  BP: 116/70  Pulse: 87  Temp: 98.2 F (36.8 C)  Resp: 18   Filed Weights   12/14/15 1521  Weight: 132 lb (59.875 kg)   .Body mass index is 19.78 kg/(m^2).  GENERAL:alert, in no acute distress and comfortable SKIN: skin color, texture, turgor are normal, no rashes or significant lesions EYES: normal, conjunctiva are pink and non-injected, sclera clear OROPHARYNX:no exudate, no erythema and lips, buccal mucosa, and tongue normal  NECK: supple, no JVD, thyroid normal size, non-tender, without nodularity LYMPH:  no palpable lymphadenopathy in the cervical, axillary or inguinal LUNGS: clear to auscultation with normal respiratory effort HEART: regular rate & rhythm,  no murmurs and no lower extremity edema ABDOMEN: abdomen soft, non-tender, normoactive bowel sounds  Musculoskeletal: no cyanosis of digits and no clubbing  PSYCH: alert & oriented x 3 with fluent speech NEURO: no focal motor/sensory deficits  LABORATORY DATA:  I have reviewed the data as listed . CBC Latest Ref Rng 12/14/2015 12/07/2015 12/01/2015  WBC 3.9 - 10.3 10e3/uL 2.7(L) 2.3(L) 2.8(L)  Hemoglobin 11.6 - 15.9 g/dL 9.0(L) 8.8(L) 8.7(L)  Hematocrit 34.8 - 46.6 % 27.9(L) 26.7(L) 27.0(L)  Platelets 145 - 400 10e3/uL  320 445(H) 150    . CMP Latest Ref Rng 12/14/2015 12/07/2015 12/01/2015  Glucose 70 - 140 mg/dl 152(H) 145(H) 94  BUN 7.0 - 26.0 mg/dL 17.4 16.5 20.8  Creatinine 0.6 - 1.1 mg/dL 0.7 0.8 0.8  Sodium 136 - 145 mEq/L 140 139 138  Potassium 3.5 - 5.1 mEq/L 4.2 4.6 4.2  CO2 22 - 29 mEq/L 24 22 23   Calcium 8.4 - 10.4 mg/dL 8.7 8.8 8.7  Total Protein 6.4 - 8.3 g/dL 7.7 8.0 7.9  Total Bilirubin 0.20 - 1.20 mg/dL 0.78 0.52 0.63  Alkaline Phos 40 - 150 U/L 113 118 100  AST 5 - 34 U/L 20 14 15   ALT 0 - 55 U/L 24 16 21  LDH 141  Sedimentation rate 143 Beta-2 microglobulin 4.85 Albumin 3.5       CYTOGENETICS:  About 75% clone of plasma cells showing t(11:14) mutation consistent with multiple myeloma on FISH  RADIOGRAPHIC STUDIES: I have personally reviewed the radiological images as listed and agreed with the findings in the report. Ct Renal Stone Study  11/20/2015  CLINICAL DATA:  Right flank pain EXAM: CT ABDOMEN AND PELVIS WITHOUT CONTRAST TECHNIQUE: Multidetector CT imaging of the abdomen and pelvis was performed following the standard protocol without IV contrast. COMPARISON:  11/05/2015 FINDINGS: The lung bases are free of acute infiltrate or sizable effusion. The liver, gallbladder, spleen, adrenal glands and pancreas are within normal limits. Ingested material is noted within the proximal duodenum. The left kidney again demonstrates some nonobstructing renal calculi. No ureteral stones are noted. The right kidney shows no obstructive change or renal calculi. The bladder is partially distended. A cervical cerclage ring is noted. Persistent fecal material is noted throughout the colon consistent with a degree of constipation. The appendix is within normal limits. The uterus has been surgically removed. No pelvic mass lesion is noted. No acute bony abnormality is seen. Scattered spinal lucencies are again identified throughout the bony structures consistent with the given clinical history  of multiple myeloma. IMPRESSION: Stable nonobstructing left renal calculi. Stable changes of multiple myeloma. Large stool burden consistent with constipation stable from the prior study. Electronically Signed   By: Inez Catalina M.D.   On: 11/20/2015 14:39    ASSESSMENT & PLAN:    Very pleasant 60 year old Caucasian female with  1) Newly diagnosed R-ISS Stage II IgA lambda multiple myeloma with standard risk cytogenetics t (11;14). PET/CT shows evidence of lytic bone lesions in the calvarium, iliac bones  and T7. No hypercalcemia or renal failure at this time. Pretreatment M spike of 5 in 2 monoclonal bands. BM bx with 69% plasma cells with Lambda light chain restriction Patient is status post cycle 1 of Vd and 1 cycle of VRd and has she tolerated well other than some grade 1 rash. Also noted to have fevers from Velcade which are controlled with naproxen.   After 2 cycles M-protein was down from 5g/dl to 2.8g/dl. Total protein continues to improve. #2 anemia due to multiple myeloma and treatment medications- gradually improving #3 fatigue due to multiple myeloma and Anemia -improving #4 multiple bone metastases from multiple myeloma. - On Zometa no bone pains currently. #5 Mild B/L thigh macular rash likely from Velcade - resolved. Now has developed mild rash over the b/l anterior abdominal wall. Has been taking antihistamines and is using dexamethasone pre-Velcade as instructed. Plan -- Patient's labs are stable. No  Prohibitive toxicitie -- continue with treatment as per current plan. --Zometa every 4 weekly for bone metastases. -Aspirin daily for VTE prophylaxis with Revlimid -acyclovir for shingles prophylaxis ( patient notes that she did get a shingles vaccine about 2 years ago). -Would plan to do 4 cycles of VRd and then consider consolidation with an Autologus HSCT. -counseled on need for good hydration and focus on good po intake. -OTC Cetrizine and Famotidine for rash. May use  topical hydrocortisone for localized rash.  - we have sent in referral to Dr. Samule Ohm at [facility] so that she has adequate time to setup the patient for consideration of Auto HSCT. -patient would need a Rpt PET/CT and BM Bx post treatment prior to HD ctx + Auto HSCT -- would allow transplant team to have this done.   #6  chronic cough likely related to postnasal drip vs GERD.  Has improved with Zyrtec and Pepcid. resolved  Plan - continue antihistamines - saline nasal spray prn - steam inhalation.prn  RTC with Dr Irene Limbo in 2 weeks for continued followup. Cbc, cmp Weekly and myeloma panel q 3weeks before each cycle of treatment.  I spent 15 minutes counseling the patient face to face. The total time spent in the appointment was 20 minutes and more than 50% was on counseling and direct patient cares.    Sullivan Lone MD [place] AAHIVMS [facility] Hematology/Oncology Physician [facility]  (Office):       [phone] (Work cell):  [phone] (Fax):           [phone]

## 2015-12-16 ENCOUNTER — Ambulatory Visit: Payer: Self-pay

## 2015-12-17 ENCOUNTER — Other Ambulatory Visit: Payer: Self-pay

## 2015-12-17 ENCOUNTER — Ambulatory Visit (HOSPITAL_BASED_OUTPATIENT_CLINIC_OR_DEPARTMENT_OTHER): Payer: BLUE CROSS/BLUE SHIELD | Admitting: Nurse Practitioner

## 2015-12-17 ENCOUNTER — Ambulatory Visit (HOSPITAL_BASED_OUTPATIENT_CLINIC_OR_DEPARTMENT_OTHER): Payer: BLUE CROSS/BLUE SHIELD

## 2015-12-17 VITALS — BP 116/72 | HR 86 | Temp 96.9°F | Resp 16

## 2015-12-17 DIAGNOSIS — R600 Localized edema: Secondary | ICD-10-CM

## 2015-12-17 DIAGNOSIS — R609 Edema, unspecified: Secondary | ICD-10-CM

## 2015-12-17 DIAGNOSIS — C9000 Multiple myeloma not having achieved remission: Secondary | ICD-10-CM | POA: Diagnosis not present

## 2015-12-17 DIAGNOSIS — Z5112 Encounter for antineoplastic immunotherapy: Secondary | ICD-10-CM | POA: Diagnosis not present

## 2015-12-17 LAB — MULTIPLE MYELOMA PANEL, SERUM
ALBUMIN SERPL ELPH-MCNC: 3.9 g/dL (ref 2.9–4.4)
ALBUMIN/GLOB SERPL: 1.2 (ref 0.7–1.7)
ALPHA 1: 0.3 g/dL (ref 0.0–0.4)
ALPHA2 GLOB SERPL ELPH-MCNC: 0.8 g/dL (ref 0.4–1.0)
B-GLOBULIN SERPL ELPH-MCNC: 1 g/dL (ref 0.7–1.3)
GAMMA GLOB SERPL ELPH-MCNC: 1.3 g/dL (ref 0.4–1.8)
GLOBULIN, TOTAL: 3.3 g/dL (ref 2.2–3.9)
IGG (IMMUNOGLOBIN G), SERUM: 515 mg/dL — AB (ref 700–1600)
IgA, Qn, Serum: 995 mg/dL — ABNORMAL HIGH (ref 87–352)
IgM, Qn, Serum: 17 mg/dL — ABNORMAL LOW (ref 26–217)
M PROTEIN SERPL ELPH-MCNC: 0.5 g/dL — AB
Total Protein: 7.2 g/dL (ref 6.0–8.5)

## 2015-12-17 MED ORDER — PROCHLORPERAZINE MALEATE 10 MG PO TABS
10.0000 mg | ORAL_TABLET | Freq: Once | ORAL | Status: AC
  Administered 2015-12-17: 10 mg via ORAL

## 2015-12-17 MED ORDER — BORTEZOMIB CHEMO SQ INJECTION 3.5 MG (2.5MG/ML)
1.3000 mg/m2 | Freq: Once | INTRAMUSCULAR | Status: AC
  Administered 2015-12-17: 2.25 mg via SUBCUTANEOUS
  Filled 2015-12-17: qty 2.25

## 2015-12-17 MED ORDER — PROCHLORPERAZINE MALEATE 10 MG PO TABS
ORAL_TABLET | ORAL | Status: AC
  Filled 2015-12-17: qty 1

## 2015-12-17 MED ORDER — ZOLEDRONIC ACID 4 MG/100ML IV SOLN
4.0000 mg | Freq: Once | INTRAVENOUS | Status: AC
  Administered 2015-12-17: 4 mg via INTRAVENOUS
  Filled 2015-12-17: qty 100

## 2015-12-17 NOTE — Patient Instructions (Signed)
[facility] Discharge Instructions for Patients Receiving Chemotherapy  Today you received the following chemotherapy agent: Velcade  You also received Zometa today   To help prevent nausea and vomiting after your treatment, we encourage you to take your nausea medication as prescribed.    If you develop nausea and vomiting that is not controlled by your nausea medication, call the clinic.   BELOW ARE SYMPTOMS THAT SHOULD BE REPORTED IMMEDIATELY:  *FEVER GREATER THAN 100.5 F  *CHILLS WITH OR WITHOUT FEVER  NAUSEA AND VOMITING THAT IS NOT CONTROLLED WITH YOUR NAUSEA MEDICATION  *UNUSUAL SHORTNESS OF BREATH  *UNUSUAL BRUISING OR BLEEDING  TENDERNESS IN MOUTH AND THROAT WITH OR WITHOUT PRESENCE OF ULCERS  *URINARY PROBLEMS  *BOWEL PROBLEMS  UNUSUAL RASH Items with * indicate a potential emergency and should be followed up as soon as possible.  Feel free to call the clinic you have any questions or concerns. The clinic phone number is [phone].  Please show the [place] at check-in to the Emergency Department and triage nurse.

## 2015-12-17 NOTE — Progress Notes (Signed)
Patient reports new onset swelling in ankles x3 days. right slightly more than left. Selena Lesser, NP at chairside to assess. Patient on aspirin per [name]. Denies CV/resp symptoms such as chest pain, shortness or breath, difficulty breathing. Recommended elevating legs while resting, decrease salt, OK to wear compression hose. Doppler ordered, [facility] RN to contact with schedule on Monday.  Patient informed to report chest pain, SOB, increased swelling in one extremity. Patient verbalizes understanding.

## 2015-12-19 ENCOUNTER — Encounter: Payer: Self-pay | Admitting: Nurse Practitioner

## 2015-12-19 ENCOUNTER — Other Ambulatory Visit: Payer: Self-pay | Admitting: Nurse Practitioner

## 2015-12-19 DIAGNOSIS — R609 Edema, unspecified: Secondary | ICD-10-CM | POA: Insufficient documentation

## 2015-12-19 NOTE — Assessment & Plan Note (Signed)
Patient presented to the cancer Center to receive cycle 3, day 11 of her Velcade injections.  He further notes for details of today's visit.  Patient is scheduled to return on 12/28/2015 for labs, visit, and her next Velcade injection.

## 2015-12-19 NOTE — Assessment & Plan Note (Signed)
Patient reports trace edema to bilateral lower extremities.  She denies any calf pain, chest pain, or shortness of breath.  Exam today revealed minimal edema to her bilateral lower legs; with the right slightly larger than the left.  There was no calf tenderness.  Due to the late hour of the day-discussed option of going to the emergency department for Doppler ultrasound to rule out DVT.  Patient prefers to wait until Monday, 12/20/2015 to undergo a Doppler ultrasound to rule out DVT.  Patient was advised to go directly to the emergency department over the weekend she develops any worsening symptoms whatsoever.  The plan is to arrange the Doppler ultrasound for Monday morning, 12/20/2015.

## 2015-12-19 NOTE — Progress Notes (Signed)
SYMPTOM MANAGEMENT CLINIC   HPI: Paula Johnson 60 y.o. female diagnosed with multiple myeloma with bone metastasis.  Currently undergoing Velcade injections.     HPI  Review of Systems  Musculoskeletal:       Edema to bilateral lower extremities.  All other systems reviewed and are negative.   Past Medical History  Diagnosis Date  . Prediabetes   . Fibroid     reason for Hysterectomy  . Urinary incontinence   . PONV (postoperative nausea and vomiting)     thinks morphine caused nausea  . Pain aggravated by activities of daily living     states she pulled something at right sternum in dec 2016  . Cancer ([place]) 1999    squamous cell carcinoma of thymus    Past Surgical History  Procedure Laterality Date  . Thymectomy  1999  . Abdominal hysterectomy  1997    TAH--ovaries remain--Dr. Newton Pigg    has MALIGNANT NEOPLASM OF THYMUS; HYPERLIPIDEMIA; OBSTRUCTIVE SLEEP APNEA; RESTLESS LEGS SYNDROME; DYSPNEA; HYPOXEMIA; Anemia; Hypergammaglobulinemia; Plasma cell dyscrasia; Absolute anemia; Multiple myeloma ([place]); Metastatic multiple myeloma to bone ([place]); Rash; Fever; Hematuria; Cough; and Peripheral edema on her problem list.    is allergic to ampicillin.    Medication List       This list is accurate as of: 12/17/15 11:59 PM.  Always use your most recent med list.               acyclovir 400 MG tablet  Commonly known as:  ZOVIRAX  Take 1 tablet (400 mg total) by mouth 2 (two) times daily.     aspirin EC 325 MG tablet  Take 1 tablet (325 mg total) by mouth daily.     cetirizine 10 MG tablet  Commonly known as:  ZYRTEC  Take 10 mg by mouth daily.     cholecalciferol 1000 units tablet  Commonly known as:  VITAMIN D  Take 2,000 Units by mouth daily.     dexamethasone 4 MG tablet  Commonly known as:  DECADRON  Take 10 tablets (40 mg) on days 1, 8, and 15 of chemo. Repeat every 21 days.     famotidine 10 MG tablet  Commonly known as:  PEPCID  Take 10  mg by mouth 2 (two) times daily.     lenalidomide 25 MG capsule  Commonly known as:  REVLIMID  Take 1 capsule (25 mg total) by mouth daily. Take 1 capsule on days 1-14. Repeat every 21 days. Authorization # [id number]     LORazepam 0.5 MG tablet  Commonly known as:  ATIVAN  Take 1 tablet (0.5 mg total) by mouth every 6 (six) hours as needed (Nausea or vomiting).     ondansetron 8 MG tablet  Commonly known as:  ZOFRAN  Take 1 tablet (8 mg total) by mouth 2 (two) times daily as needed (Nausea or vomiting).     prochlorperazine 10 MG tablet  Commonly known as:  COMPAZINE  Take 1 tablet (10 mg total) by mouth every 6 (six) hours as needed (Nausea or vomiting).     senna-docusate 8.6-50 MG tablet  Commonly known as:  SENNA S  Take 2 tablets by mouth at bedtime.         PHYSICAL EXAMINATION  Oncology Vitals 12/17/2015 12/14/2015  Height - 174 cm  Weight - 59.875 kg  Weight (lbs) - 132 lbs  BMI (kg/m2) - 19.78 kg/m2  Temp 96.9 98.2  Pulse 86 87  Resp 16  18  SpO2 100 100  BSA (m2) - 1.7 m2   BP Readings from Last 2 Encounters:  12/17/15 116/72  12/14/15 116/70    Physical Exam  Constitutional: She is well-developed, well-nourished, and in no distress.  HENT:  Head: Normocephalic and atraumatic.  Eyes: Conjunctivae and EOM are normal. Pupils are equal, round, and reactive to light. Right eye exhibits no discharge. Left eye exhibits no discharge. No scleral icterus.  Musculoskeletal: She exhibits edema. She exhibits no tenderness.  Trace bilateral lower extremity edema; with the right slightly larger than the left.  No Tenderness with palpation.    LABORATORY DATA:. No visits with results within 3 Day(s) from this visit. Latest known visit with results is:  Appointment on 12/14/2015  Component Date Value Ref Range Status  . IgG, Qn, Serum 12/14/2015 515* 700 - 1600 mg/dL Final  . IgA, Qn, Serum 12/14/2015 995* 87 - 352 mg/dL Final   Comment: Results confirmed  on dilution.   . IgM, Qn, Serum 12/14/2015 17* 26 - 217 mg/dL Final   Result confirmed on concentration.  . Total Protein 12/14/2015 7.2  6.0 - 8.5 g/dL Final  . Albumin SerPl Elph-Mcnc 12/14/2015 3.9  2.9 - 4.4 g/dL Final  . Alpha 1 12/14/2015 0.3  0.0 - 0.4 g/dL Final  . Alpha2 Glob SerPl Elph-Mcnc 12/14/2015 0.8  0.4 - 1.0 g/dL Final  . B-Globulin SerPl Elph-Mcnc 12/14/2015 1.0  0.7 - 1.3 g/dL Final  . Gamma Glob SerPl Elph-Mcnc 12/14/2015 1.3  0.4 - 1.8 g/dL Final  . M Protein SerPl Elph-Mcnc 12/14/2015 0.5* Not Observed g/dL Final  . Globulin, Total 12/14/2015 3.3  2.2 - 3.9 g/dL Final  . Albumin/Glob SerPl 12/14/2015 1.2  0.7 - 1.7 Final  . IFE 1 12/14/2015 Comment   Final   Comment: Immunofixation shows IgA monoclonal protein with lambda light chain specificity.   . Please Note 12/14/2015 Comment   Final   Comment: Protein electrophoresis scan will follow via computer, mail, or courier delivery.   Loletha Grayer Kappa Free Light Chain 12/14/2015 10.22  3.30 - 19.40 mg/L Final  . Ig Lambda Free Light Chain 12/14/2015 39.18* 5.71 - 26.30 mg/L Final  . Kappa/Lambda FluidC Ratio 12/14/2015 0.26  0.26 - 1.65 Final  . WBC 12/14/2015 2.7* 3.9 - 10.3 10e3/uL Final  . NEUT# 12/14/2015 2.2  1.5 - 6.5 10e3/uL Final  . HGB 12/14/2015 9.0* 11.6 - 15.9 g/dL Final  . HCT 12/14/2015 27.9* 34.8 - 46.6 % Final  . Platelets 12/14/2015 320  145 - 400 10e3/uL Final  . MCV 12/14/2015 92.7  79.5 - 101.0 fL Final  . MCH 12/14/2015 29.9  25.1 - 34.0 pg Final  . MCHC 12/14/2015 32.3  31.5 - 36.0 g/dL Final  . RBC 12/14/2015 3.01* 3.70 - 5.45 10e6/uL Final  . RDW 12/14/2015 17.5* 11.2 - 14.5 % Final  . lymph# 12/14/2015 0.4* 0.9 - 3.3 10e3/uL Final  . MONO# 12/14/2015 0.0* 0.1 - 0.9 10e3/uL Final  . Eosinophils Absolute 12/14/2015 0.0  0.0 - 0.5 10e3/uL Final  . Basophils Absolute 12/14/2015 0.0  0.0 - 0.1 10e3/uL Final  . NEUT% 12/14/2015 83.5* 38.4 - 76.8 % Final  . LYMPH% 12/14/2015 15.4  14.0 - 49.7 %  Final  . MONO% 12/14/2015 0.7  0.0 - 14.0 % Final  . EOS% 12/14/2015 0.0  0.0 - 7.0 % Final  . BASO% 12/14/2015 0.4  0.0 - 2.0 % Final  . Retic % 12/14/2015 2.82* 0.70 - 2.10 %  Final  . Retic Ct Abs 12/14/2015 84.88  33.70 - 90.70 10e3/uL Final  . Immature Retic Fract 12/14/2015 14.30* 1.60 - 10.00 % Final  . Sodium 12/14/2015 140  136 - 145 mEq/L Final  . Potassium 12/14/2015 4.2  3.5 - 5.1 mEq/L Final  . Chloride 12/14/2015 107  98 - 109 mEq/L Final  . CO2 12/14/2015 24  22 - 29 mEq/L Final  . Glucose 12/14/2015 152* 70 - 140 mg/dl Final   Glucose reference range is for nonfasting patients. Fasting glucose reference range is 70- 100.  Marland Kitchen BUN 12/14/2015 17.4  7.0 - 26.0 mg/dL Final  . Creatinine 12/14/2015 0.7  0.6 - 1.1 mg/dL Final  . Total Bilirubin 12/14/2015 0.78  0.20 - 1.20 mg/dL Final  . Alkaline Phosphatase 12/14/2015 113  40 - 150 U/L Final  . AST 12/14/2015 20  5 - 34 U/L Final  . ALT 12/14/2015 24  0 - 55 U/L Final  . Total Protein 12/14/2015 7.7  6.4 - 8.3 g/dL Final  . Albumin 12/14/2015 3.9  3.5 - 5.0 g/dL Final  . Calcium 12/14/2015 8.7  8.4 - 10.4 mg/dL Final  . Anion Gap 12/14/2015 9  3 - 11 mEq/L Final  . EGFR 12/14/2015 87* >90 ml/min/1.73 m2 Final   eGFR is calculated using the CKD-EPI Creatinine Equation (2009)     RADIOGRAPHIC STUDIES: No results found.  ASSESSMENT/PLAN:    Multiple myeloma ([place]) Patient presented to the cancer Center to receive cycle 3, day 11 of her Velcade injections.  He further notes for details of today's visit.  Patient is scheduled to return on 12/28/2015 for labs, visit, and her next Velcade injection.  Peripheral edema Patient reports trace edema to bilateral lower extremities.  She denies any calf pain, chest pain, or shortness of breath.  Exam today revealed minimal edema to her bilateral lower legs; with the right slightly larger than the left.  There was no calf tenderness.  Due to the late hour of the day-discussed option  of going to the emergency department for Doppler ultrasound to rule out DVT.  Patient prefers to wait until Monday, 12/20/2015 to undergo a Doppler ultrasound to rule out DVT.  Patient was advised to go directly to the emergency department over the weekend she develops any worsening symptoms whatsoever.  The plan is to arrange the Doppler ultrasound for Monday morning, 12/20/2015.  Patient stated understanding of all instructions; and was in agreement with this plan of care. The patient knows to call the clinic with any problems, questions or concerns.   Review/collaboration with [name] regarding all aspects of patient's visit today.   Total time spent with patient was 15 minutes;  with greater than 75 percent of that time spent in face to face counseling regarding patient's symptoms,  and coordination of care and follow up.  Disclaimer:This dictation was prepared with Dragon/digital dictation along with [employer]. Any transcriptional errors that result from this process are unintentional.  Drue Second, NP 12/19/2015

## 2015-12-20 ENCOUNTER — Telehealth: Payer: Self-pay | Admitting: *Deleted

## 2015-12-20 ENCOUNTER — Encounter (HOSPITAL_COMMUNITY): Payer: Self-pay

## 2015-12-20 ENCOUNTER — Telehealth: Payer: Self-pay | Admitting: Hematology

## 2015-12-20 ENCOUNTER — Ambulatory Visit (HOSPITAL_COMMUNITY)
Admission: RE | Admit: 2015-12-20 | Discharge: 2015-12-20 | Disposition: A | Discharge status: Home / Self Care | Payer: BLUE CROSS/BLUE SHIELD | Source: Ambulatory Visit | Attending: Nurse Practitioner | Admitting: Nurse Practitioner

## 2015-12-20 ENCOUNTER — Other Ambulatory Visit: Payer: Self-pay | Admitting: Hematology

## 2015-12-20 DIAGNOSIS — R609 Edema, unspecified: Secondary | ICD-10-CM | POA: Diagnosis not present

## 2015-12-20 DIAGNOSIS — R7303 Prediabetes: Secondary | ICD-10-CM | POA: Diagnosis not present

## 2015-12-20 DIAGNOSIS — R600 Localized edema: Secondary | ICD-10-CM | POA: Diagnosis present

## 2015-12-20 MED ORDER — LENALIDOMIDE 25 MG PO CAPS
25.0000 mg | ORAL_CAPSULE | Freq: Every day | ORAL | Status: DC
Start: 2015-12-20 — End: 2016-01-06

## 2015-12-20 NOTE — Progress Notes (Signed)
Received call from Ms Turnquist to send a new prescription to [place] for her Revlimid.  She will need to restart therapy next Tuesday December 28, 2015.

## 2015-12-20 NOTE — Telephone Encounter (Signed)
per pogf to sch pt appt-cld pt and gave tp time & date of appt /location for doppler sch

## 2015-12-20 NOTE — Progress Notes (Signed)
VASCULAR LAB PRELIMINARY  PRELIMINARY  PRELIMINARY  PRELIMINARY  Right lower extremity venous duplex completed.    Preliminary report:  Right:  No evidence of DVT, superficial thrombosis, or Baker's cyst.  Brinton Brandel, RVS 12/20/2015, 5:29 PM

## 2015-12-20 NOTE — Telephone Encounter (Signed)
Vascular US/Doppler Right leg ordered. Urgent POF sent to schedule doppler for today and notify pt.

## 2015-12-20 NOTE — Telephone Encounter (Signed)
"  I did not hear from [place] last week for Bone Marrow Transplant.  [name] wanted me to call his nurse if I did not hear from [facility]."  Call transferred ext [phone].

## 2015-12-21 ENCOUNTER — Ambulatory Visit: Payer: Self-pay

## 2015-12-21 ENCOUNTER — Other Ambulatory Visit: Payer: Self-pay

## 2015-12-21 ENCOUNTER — Telehealth: Payer: Self-pay | Admitting: *Deleted

## 2015-12-21 ENCOUNTER — Ambulatory Visit: Payer: Self-pay | Admitting: Hematology

## 2015-12-21 NOTE — Progress Notes (Signed)
RX for Revlimid e-scribed to [employer].  Female of non child bearing potential Auth# [id number]

## 2015-12-21 NOTE — Telephone Encounter (Signed)
LM to advise pt result of the doppler was negative.

## 2015-12-24 ENCOUNTER — Encounter: Payer: Self-pay | Admitting: Hematology

## 2015-12-24 ENCOUNTER — Telehealth: Payer: Self-pay | Admitting: Pharmacist

## 2015-12-24 ENCOUNTER — Ambulatory Visit: Payer: Self-pay

## 2015-12-24 ENCOUNTER — Other Ambulatory Visit: Payer: Self-pay

## 2015-12-24 NOTE — Progress Notes (Signed)
10/29/15 faxed - I sent to medical records

## 2015-12-24 NOTE — Telephone Encounter (Signed)
Attempted to reach pt by phone to discuss Revlimid tx. Left vm to have pt call [facility] to discuss any concerns/issues. Kennith Center, Pharm.D., CPP 12/24/2015@12 :42 PM

## 2015-12-28 ENCOUNTER — Ambulatory Visit (HOSPITAL_BASED_OUTPATIENT_CLINIC_OR_DEPARTMENT_OTHER): Payer: BLUE CROSS/BLUE SHIELD

## 2015-12-28 ENCOUNTER — Encounter: Payer: Self-pay | Admitting: Pharmacist

## 2015-12-28 ENCOUNTER — Other Ambulatory Visit (HOSPITAL_BASED_OUTPATIENT_CLINIC_OR_DEPARTMENT_OTHER): Payer: BLUE CROSS/BLUE SHIELD

## 2015-12-28 ENCOUNTER — Ambulatory Visit (HOSPITAL_BASED_OUTPATIENT_CLINIC_OR_DEPARTMENT_OTHER): Payer: BLUE CROSS/BLUE SHIELD | Admitting: Hematology

## 2015-12-28 ENCOUNTER — Encounter: Payer: Self-pay | Admitting: Hematology

## 2015-12-28 VITALS — BP 106/67 | HR 87 | Temp 98.1°F | Resp 17 | Wt 129.8 lb

## 2015-12-28 DIAGNOSIS — C9000 Multiple myeloma not having achieved remission: Secondary | ICD-10-CM

## 2015-12-28 DIAGNOSIS — Z5112 Encounter for antineoplastic immunotherapy: Secondary | ICD-10-CM

## 2015-12-28 DIAGNOSIS — R21 Rash and other nonspecific skin eruption: Secondary | ICD-10-CM

## 2015-12-28 DIAGNOSIS — D630 Anemia in neoplastic disease: Secondary | ICD-10-CM | POA: Diagnosis not present

## 2015-12-28 DIAGNOSIS — R5383 Other fatigue: Secondary | ICD-10-CM | POA: Diagnosis not present

## 2015-12-28 DIAGNOSIS — R05 Cough: Secondary | ICD-10-CM

## 2015-12-28 LAB — CBC WITH DIFFERENTIAL/PLATELET
BASO%: 0.3 % (ref 0.0–2.0)
BASOS ABS: 0 10*3/uL (ref 0.0–0.1)
EOS%: 0 % (ref 0.0–7.0)
Eosinophils Absolute: 0 10*3/uL (ref 0.0–0.5)
HEMATOCRIT: 29.7 % — AB (ref 34.8–46.6)
HGB: 9.7 g/dL — ABNORMAL LOW (ref 11.6–15.9)
LYMPH#: 0.4 10*3/uL — AB (ref 0.9–3.3)
LYMPH%: 10.1 % — ABNORMAL LOW (ref 14.0–49.7)
MCH: 30.5 pg (ref 25.1–34.0)
MCHC: 32.9 g/dL (ref 31.5–36.0)
MCV: 92.9 fL (ref 79.5–101.0)
MONO#: 0.1 10*3/uL (ref 0.1–0.9)
MONO%: 3.6 % (ref 0.0–14.0)
NEUT#: 3.1 10*3/uL (ref 1.5–6.5)
NEUT%: 86 % — AB (ref 38.4–76.8)
PLATELETS: 432 10*3/uL — AB (ref 145–400)
RBC: 3.19 10*6/uL — ABNORMAL LOW (ref 3.70–5.45)
RDW: 19 % — ABNORMAL HIGH (ref 11.2–14.5)
WBC: 3.6 10*3/uL — ABNORMAL LOW (ref 3.9–10.3)

## 2015-12-28 LAB — COMPREHENSIVE METABOLIC PANEL
ALK PHOS: 107 U/L (ref 40–150)
ALT: 28 U/L (ref 0–55)
AST: 19 U/L (ref 5–34)
Albumin: 4 g/dL (ref 3.5–5.0)
Anion Gap: 8 mEq/L (ref 3–11)
BUN: 18.5 mg/dL (ref 7.0–26.0)
CALCIUM: 9.2 mg/dL (ref 8.4–10.4)
CHLORIDE: 109 meq/L (ref 98–109)
CO2: 24 meq/L (ref 22–29)
Creatinine: 0.8 mg/dL (ref 0.6–1.1)
EGFR: 83 mL/min/{1.73_m2} — AB (ref >90)
GLUCOSE: 128 mg/dL (ref 70–140)
POTASSIUM: 4.2 meq/L (ref 3.5–5.1)
Sodium: 141 mEq/L (ref 136–145)
Total Bilirubin: 0.65 mg/dL (ref 0.20–1.20)
Total Protein: 7.7 g/dL (ref 6.4–8.3)

## 2015-12-28 MED ORDER — PROCHLORPERAZINE MALEATE 10 MG PO TABS
ORAL_TABLET | ORAL | Status: AC
  Filled 2015-12-28: qty 1

## 2015-12-28 MED ORDER — BORTEZOMIB CHEMO SQ INJECTION 3.5 MG (2.5MG/ML)
1.3000 mg/m2 | Freq: Once | INTRAMUSCULAR | Status: AC
  Administered 2015-12-28: 2.25 mg via SUBCUTANEOUS
  Filled 2015-12-28: qty 2.25

## 2015-12-28 MED ORDER — PROCHLORPERAZINE MALEATE 10 MG PO TABS
10.0000 mg | ORAL_TABLET | Freq: Once | ORAL | Status: AC
  Administered 2015-12-28: 10 mg via ORAL

## 2015-12-28 NOTE — Progress Notes (Signed)
Oral Chemotherapy Follow-Up Form  Original Start date of oral chemotherapy: __2/20/17__   follow up regarding patient's oral chemotherapy medication: _Revlimid/velcade_  Pt is doing well today. Saw patient in the infusion area. Started Revlimid with cycle 4 today. Also received velcade today. No issues so far with treatment. Some fatigue but otherwise doing well. She would have 1 day of chills/sweats etc after each velcade but after using Naproxen prior to velcade those symptoms have not returned. No other issues to report  Will follow up and call patient again in _2 weeks at end of 4th cycle___   Thank you,  Montel Clock, PharmD, [facility]

## 2015-12-28 NOTE — Patient Instructions (Signed)
Bortezomib injection What is this medicine? BORTEZOMIB (bor TEZ oh mib) is a medicine that targets proteins in cancer cells and stops the cancer cells from growing. It is used to treat multiple myeloma and mantle-cell lymphoma. This medicine may be used for other purposes; ask your health care provider or pharmacist if you have questions. What should I tell my health care provider before I take this medicine? They need to know if you have any of these conditions: -diabetes -heart disease -irregular heartbeat -liver disease -on hemodialysis -low blood counts, like low white blood cells, platelets, or hemoglobin -peripheral neuropathy -taking medicine for blood pressure -an unusual or allergic reaction to bortezomib, mannitol, boron, other medicines, foods, dyes, or preservatives -pregnant or trying to get pregnant -breast-feeding How should I use this medicine? This medicine is for injection into a vein or for injection under the skin. It is given by a health care professional in a hospital or clinic setting. Talk to your pediatrician regarding the use of this medicine in children. Special care may be needed. Overdosage: If you think you have taken too much of this medicine contact a poison control center or emergency room at once. NOTE: This medicine is only for you. Do not share this medicine with others. What if I miss a dose? It is important not to miss your dose. Call your doctor or health care professional if you are unable to keep an appointment. What may interact with this medicine? This medicine may interact with the following medications: -ketoconazole -rifampin -ritonavir -St. John's Wort This list may not describe all possible interactions. Give your health care provider a list of all the medicines, herbs, non-prescription drugs, or dietary supplements you use. Also tell them if you smoke, drink alcohol, or use illegal drugs. Some items may interact with your medicine. What  should I watch for while using this medicine? Visit your doctor for checks on your progress. This drug may make you feel generally unwell. This is not uncommon, as chemotherapy can affect healthy cells as well as cancer cells. Report any side effects. Continue your course of treatment even though you feel ill unless your doctor tells you to stop. You may get drowsy or dizzy. Do not drive, use machinery, or do anything that needs mental alertness until you know how this medicine affects you. Do not stand or sit up quickly, especially if you are an older patient. This reduces the risk of dizzy or fainting spells. In some cases, you may be given additional medicines to help with side effects. Follow all directions for their use. Call your doctor or health care professional for advice if you get a fever, chills or sore throat, or other symptoms of a cold or flu. Do not treat yourself. This drug decreases your body's ability to fight infections. Try to avoid being around people who are sick. This medicine may increase your risk to bruise or bleed. Call your doctor or health care professional if you notice any unusual bleeding. You may need blood work done while you are taking this medicine. In some patients, this medicine may cause a serious brain infection that may cause death. If you have any problems seeing, thinking, speaking, walking, or standing, tell your doctor right away. If you cannot reach your doctor, urgently seek other source of medical care. Do not become pregnant while taking this medicine. Women should inform their doctor if they wish to become pregnant or think they might be pregnant. There is a potential for serious  side effects to an unborn child. Talk to your health care professional or pharmacist for more information. Do not breast-feed an infant while taking this medicine. Check with your doctor or health care professional if you get an attack of severe diarrhea, nausea and vomiting, or if  you sweat a lot. The loss of too much body fluid can make it dangerous for you to take this medicine. What side effects may I notice from receiving this medicine? Side effects that you should report to your doctor or health care professional as soon as possible: -allergic reactions like skin rash, itching or hives, swelling of the face, lips, or tongue -breathing problems -changes in hearing -changes in vision -fast, irregular heartbeat -feeling faint or lightheaded, falls -pain, tingling, numbness in the hands or feet -right upper belly pain -seizures -swelling of the ankles, feet, hands -unusual bleeding or bruising -unusually weak or tired -vomiting -yellowing of the eyes or skin Side effects that usually do not require medical attention (report to your doctor or health care professional if they continue or are bothersome): -changes in emotions or moods -constipation -diarrhea -loss of appetite -headache -irritation at site where injected -nausea This list may not describe all possible side effects. Call your doctor for medical advice about side effects. You may report side effects to FDA at 1-800-FDA-1088. Where should I keep my medicine? This drug is given in a hospital or clinic and will not be stored at home. NOTE: This sheet is a summary. It may not cover all possible information. If you have questions about this medicine, talk to your doctor, pharmacist, or health care provider.    2016, Elsevier/Gold Standard. (2014-11-10 14:47:04)

## 2015-12-29 ENCOUNTER — Telehealth: Payer: Self-pay | Admitting: Hematology

## 2015-12-29 NOTE — Progress Notes (Signed)
Paula Johnson    HEMATOLOGY/ONCOLOGY CLINIC NOTE  Date of Service: .12/28/2015  Patient Care Team: [name], MD as PCP - General (Family Medicine)  CHIEF COMPLAINTS/PURPOSE OF CONSULTATION:  Follow up for multiple myeloma  DIAGNOSIS  IgA lambda R-ISS stage II multiple myeloma with innumerable lytic lesions in the calvarium, lesion in T7 vertebra and multiple lesions in the pelvis.  No significant bone pain at this time.  Current treatment Current C3 D8 of treatment.  Planned treatment Vd for cycle 1 VRd x 3-4 cycles after Vd Zometa q4weeks Consolidation with Autologous HSCT after treatment  Prophylactic medications - aspirin 325 mg by mouth daily. - Acyclovir 400 mg oral twice daily.  HISTORY OF PRESENTING ILLNESS: Please see my initial consultation for details of her initial presentation.  INTERVAL HISTORY  Paula Johnson is here for her scheduled followup for MM . She has now completed 3 cycles of her planned treatment.  Notes minimal resolving rash on her flanks similar to previous.   No fevers or chills.  His glad that her M protein is down to 0.5 g/dL. She was able to see with Dr. Samule Ohm at [facility] her consultation note is not available at this time.  Patient notes that she was told that her transplant will probably be considered in June 2017. Labs today are stable.  She is due to start her fourth cycle of treatment today. No other acute new concerns.   MEDICAL HISTORY:  Past Medical History  Diagnosis Date  . Prediabetes   . Fibroid     reason for Hysterectomy  . Urinary incontinence   . PONV (postoperative nausea and vomiting)     thinks morphine caused nausea  . Pain aggravated by activities of daily living     states she pulled something at right sternum in dec 2016  . Cancer ([place]) 1999    squamous cell carcinoma of thymus    SURGICAL HISTORY: Past Surgical History  Procedure Laterality Date  . Thymectomy  1999  . Abdominal hysterectomy  1997   TA[facility]--ovaries remain--Dr. Newton Pigg    SOCIAL HISTORY: Social History   Social History  . Marital Status: Widowed    Spouse Name: N/A  . Number of Children: N/A  . Years of Education: N/A   Occupational History  . Not on file.   Social History Main Topics  . Smoking status: Never Smoker   . Smokeless tobacco: Never Used  . Alcohol Use: No  . Drug Use: No  . Sexual Activity:    Partners: Male    Birth Control/ Protection: Surgical     Comment: TAH--ovaries remain   Other Topics Concern  . Not on file   Social History Narrative    FAMILY HISTORY: Family History  Problem Relation Age of Onset  . COPD Father     dec age 40/s-smoked  . Diabetes Brother   . Other Brother     committed suicide age 59  . Hyperlipidemia Mother   . Diabetes Maternal Grandmother     ALLERGIES:  is allergic to ampicillin.  MEDICATIONS:  Current Outpatient Prescriptions  Medication Sig Dispense Refill  . acyclovir (ZOVIRAX) 400 MG tablet Take 1 tablet (400 mg total) by mouth 2 (two) times daily. 60 tablet 3  . aspirin EC 325 MG tablet Take 1 tablet (325 mg total) by mouth daily. 60 tablet 3  . cetirizine (ZYRTEC) 10 MG tablet Take 10 mg by mouth daily.    . cholecalciferol (VITAMIN D) 1000 units tablet  Take 2,000 Units by mouth daily.    Paula Johnson dexamethasone (DECADRON) 4 MG tablet Take 10 tablets (40 mg) on days 1, 8, and 15 of chemo. Repeat every 21 days. (Patient taking differently: Take 5 tablets (20 mg) on days 1, 8, and 15 of chemo. Repeat every 21 days.) 30 tablet 3  . famotidine (PEPCID) 10 MG tablet Take 10 mg by mouth 2 (two) times daily.    Paula Johnson lenalidomide (REVLIMID) 25 MG capsule Take 1 capsule (25 mg total) by mouth daily. Take 1 capsule on days 1-14. Repeat every 21 days.  Authorization # [id number] 71 capsule 1  . LORazepam (ATIVAN) 0.5 MG tablet Take 1 tablet (0.5 mg total) by mouth every 6 (six) hours as needed (Nausea or vomiting). 30 tablet 0  . ondansetron (ZOFRAN) 8 MG tablet  Take 1 tablet (8 mg total) by mouth 2 (two) times daily as needed (Nausea or vomiting). 30 tablet 1  . prochlorperazine (COMPAZINE) 10 MG tablet Take 1 tablet (10 mg total) by mouth every 6 (six) hours as needed (Nausea or vomiting). 30 tablet 1  . senna-docusate (SENNA S) 8.6-50 MG tablet Take 2 tablets by mouth at bedtime. 60 tablet 1   No current facility-administered medications for this visit.    REVIEW OF SYSTEMS:    10 Point review of Systems was done is negative except as noted above.  PHYSICAL EXAMINATION: ECOG PERFORMANCE STATUS: 1 - Symptomatic but completely ambulatory  . Filed Vitals:   12/28/15 1533  BP: 106/67  Pulse: 87  Temp: 98.1 F (36.7 C)  Resp: 17   Filed Weights   12/28/15 1533  Weight: 129 lb 12.8 oz (58.877 kg)   .Body mass index is 19.45 kg/(m^2).  GENERAL:alert, in no acute distress and comfortable SKIN: skin color, texture, turgor are normal, no rashes or significant lesions EYES: normal, conjunctiva are pink and non-injected, sclera clear OROPHARYNX:no exudate, no erythema and lips, buccal mucosa, and tongue normal  NECK: supple, no JVD, thyroid normal size, non-tender, without nodularity LYMPH:  no palpable lymphadenopathy in the cervical, axillary or inguinal LUNGS: clear to auscultation with normal respiratory effort HEART: regular rate & rhythm,  no murmurs and no lower extremity edema ABDOMEN: abdomen soft, non-tender, normoactive bowel sounds  Musculoskeletal: no cyanosis of digits and no clubbing  PSYCH: alert & oriented x 3 with fluent speech NEURO: no focal motor/sensory deficits  LABORATORY DATA:  I have reviewed the data as listed . CBC Latest Ref Rng 12/28/2015 12/14/2015 12/07/2015  WBC 3.9 - 10.3 10e3/uL 3.6(L) 2.7(L) 2.3(L)  Hemoglobin 11.6 - 15.9 g/dL 9.7(L) 9.0(L) 8.8(L)  Hematocrit 34.8 - 46.6 % 29.7(L) 27.9(L) 26.7(L)  Platelets 145 - 400 10e3/uL 432(H) 320 445(H)    . CMP Latest Ref Rng 12/28/2015 12/14/2015 12/14/2015    Glucose 70 - 140 mg/dl 128 152(H) -  BUN 7.0 - 26.0 mg/dL 18.5 17.4 -  Creatinine 0.6 - 1.1 mg/dL 0.8 0.7 -  Sodium 136 - 145 mEq/L 141 140 -  Potassium 3.5 - 5.1 mEq/L 4.2 4.2 -  CO2 22 - 29 mEq/L 24 24 -  Calcium 8.4 - 10.4 mg/dL 9.2 8.7 -  Total Protein 6.4 - 8.3 g/dL 7.7 7.7 7.2  Total Bilirubin 0.20 - 1.20 mg/dL 0.65 0.78 -  Alkaline Phos 40 - 150 U/L 107 113 -  AST 5 - 34 U/L 19 20 -  ALT 0 - 55 U/L 28 24 -        CYTOGENETICS:  About 75%  clone of plasma cells showing t(11:14) mutation consistent with multiple myeloma on FISH  RADIOGRAPHIC STUDIES: I have personally reviewed the radiological images as listed and agreed with the findings in the report. No results found.  ASSESSMENT & PLAN:    Very pleasant 60 year old Caucasian female with  1) Newly diagnosed R-ISS Stage II IgA lambda multiple myeloma with standard risk cytogenetics t (11;14). PET/CT shows evidence of lytic bone lesions in the calvarium, iliac bones  and T7. No hypercalcemia or renal failure at this time. Pretreatment M spike of 5 in 2 monoclonal bands. BM bx with 69% plasma cells with Lambda light chain restriction Patient is status post cycle 1 of Vd and 1 cycle of VRd and has she tolerated well other than some grade 1 rash. Also noted to have fevers from Velcade which are controlled with naproxen.   After 2 cycles M-protein was down from 5g/dl to 2.8g/dl to 0.5g/dl (after 3 cycles of treatment) Total protein continues to improve. #2 anemia due to multiple myeloma and treatment medications- gradually improving #3 fatigue due to multiple myeloma and Anemia -improving #4 multiple bone metastases from multiple myeloma. - On Zometa no bone pains currently. #5 Mild B/L flank macular rash likely from Velcade - resolving. Has been taking antihistamines and is using dexamethasone pre-Velcade as instructed. Plan -- Patient's labs are stable. No  Prohibitive toxicities --she is glad that her improvement is  down to 0.5g/dl -- continue with treatment as per current plan. --Zometa every 4 weekly for bone metastases. -Aspirin daily for VTE prophylaxis with Revlimid -acyclovir for shingles prophylaxis ( patient notes that she did get a shingles vaccine about 2 years ago). --counseled on need for good hydration and focus on good po intake. -OTC Cetrizine and Famotidine for rash. May use topical hydrocortisone for localized rash.  - patient has been evaluated by Dr. Samule Ohm at [facility] for Auto HSCT.  Consultation note not available yet.  Patient was informed that the transplant might occur in June.  She would likely need another 3 cycles of treatment. With the typically limited Revlimid used to 4 cycles with a maximum of 6 months of treatment to allow for optimal stem cell collection. -patient would need a Rpt PET/CT and BM Bx post treatment prior to HD ctx + Auto HSCT -- would allow transplant team to have this done.   #6 chronic cough likely related to postnasal drip vs GERD.  Has improved with Zyrtec and Pepcid. resolved  Plan - continue antihistamines - saline nasal spray prn - steam inhalation.prn  RTC with Dr Irene Limbo in 2 weeks for continued followup. Cbc, cmp Weekly and myeloma panel q 3weeks before each cycle of treatment.  I spent 15 minutes counseling the patient face to face. The total time spent in the appointment was 20 minutes and more than 50% was on counseling and direct patient cares.    Sullivan Lone MD [place] AAHIVMS [facility] [address] Hematology/Oncology Physician [facility]  (Office):       [phone] (Work cell):  [phone] (Fax):           [phone]

## 2015-12-29 NOTE — Telephone Encounter (Signed)
cld pt and left message of time & date of appt for 4/18

## 2015-12-30 ENCOUNTER — Telehealth: Payer: Self-pay | Admitting: *Deleted

## 2015-12-30 NOTE — Telephone Encounter (Signed)
Per staff message and POF I have scheduled appts. Advised scheduler of appts. JMW  

## 2015-12-31 ENCOUNTER — Telehealth: Payer: Self-pay | Admitting: Hematology

## 2015-12-31 ENCOUNTER — Ambulatory Visit (HOSPITAL_BASED_OUTPATIENT_CLINIC_OR_DEPARTMENT_OTHER): Payer: BLUE CROSS/BLUE SHIELD

## 2015-12-31 VITALS — BP 118/64 | HR 87 | Temp 98.0°F | Resp 18

## 2015-12-31 DIAGNOSIS — C9000 Multiple myeloma not having achieved remission: Secondary | ICD-10-CM | POA: Diagnosis not present

## 2015-12-31 DIAGNOSIS — Z5112 Encounter for antineoplastic immunotherapy: Secondary | ICD-10-CM

## 2015-12-31 MED ORDER — PROCHLORPERAZINE MALEATE 10 MG PO TABS
ORAL_TABLET | ORAL | Status: AC
  Filled 2015-12-31: qty 1

## 2015-12-31 MED ORDER — BORTEZOMIB CHEMO SQ INJECTION 3.5 MG (2.5MG/ML)
1.3000 mg/m2 | Freq: Once | INTRAMUSCULAR | Status: AC
  Administered 2015-12-31: 2.25 mg via SUBCUTANEOUS
  Filled 2015-12-31: qty 2.25

## 2015-12-31 MED ORDER — PROCHLORPERAZINE MALEATE 10 MG PO TABS
10.0000 mg | ORAL_TABLET | Freq: Once | ORAL | Status: AC
  Administered 2015-12-31: 10 mg via ORAL

## 2015-12-31 NOTE — Patient Instructions (Signed)
Bortezomib injection What is this medicine? BORTEZOMIB (bor TEZ oh mib) is a medicine that targets proteins in cancer cells and stops the cancer cells from growing. It is used to treat multiple myeloma and mantle-cell lymphoma. This medicine may be used for other purposes; ask your health care provider or pharmacist if you have questions. What should I tell my health care provider before I take this medicine? They need to know if you have any of these conditions: -diabetes -heart disease -irregular heartbeat -liver disease -on hemodialysis -low blood counts, like low white blood cells, platelets, or hemoglobin -peripheral neuropathy -taking medicine for blood pressure -an unusual or allergic reaction to bortezomib, mannitol, boron, other medicines, foods, dyes, or preservatives -pregnant or trying to get pregnant -breast-feeding How should I use this medicine? This medicine is for injection into a vein or for injection under the skin. It is given by a health care professional in a hospital or clinic setting. Talk to your pediatrician regarding the use of this medicine in children. Special care may be needed. Overdosage: If you think you have taken too much of this medicine contact a poison control center or emergency room at once. NOTE: This medicine is only for you. Do not share this medicine with others. What if I miss a dose? It is important not to miss your dose. Call your doctor or health care professional if you are unable to keep an appointment. What may interact with this medicine? This medicine may interact with the following medications: -ketoconazole -rifampin -ritonavir -St. John's Wort This list may not describe all possible interactions. Give your health care provider a list of all the medicines, herbs, non-prescription drugs, or dietary supplements you use. Also tell them if you smoke, drink alcohol, or use illegal drugs. Some items may interact with your medicine. What  should I watch for while using this medicine? Visit your doctor for checks on your progress. This drug may make you feel generally unwell. This is not uncommon, as chemotherapy can affect healthy cells as well as cancer cells. Report any side effects. Continue your course of treatment even though you feel ill unless your doctor tells you to stop. You may get drowsy or dizzy. Do not drive, use machinery, or do anything that needs mental alertness until you know how this medicine affects you. Do not stand or sit up quickly, especially if you are an older patient. This reduces the risk of dizzy or fainting spells. In some cases, you may be given additional medicines to help with side effects. Follow all directions for their use. Call your doctor or health care professional for advice if you get a fever, chills or sore throat, or other symptoms of a cold or flu. Do not treat yourself. This drug decreases your body's ability to fight infections. Try to avoid being around people who are sick. This medicine may increase your risk to bruise or bleed. Call your doctor or health care professional if you notice any unusual bleeding. You may need blood work done while you are taking this medicine. In some patients, this medicine may cause a serious brain infection that may cause death. If you have any problems seeing, thinking, speaking, walking, or standing, tell your doctor right away. If you cannot reach your doctor, urgently seek other source of medical care. Do not become pregnant while taking this medicine. Women should inform their doctor if they wish to become pregnant or think they might be pregnant. There is a potential for serious   side effects to an unborn child. Talk to your health care professional or pharmacist for more information. Do not breast-feed an infant while taking this medicine. Check with your doctor or health care professional if you get an attack of severe diarrhea, nausea and vomiting, or if  you sweat a lot. The loss of too much body fluid can make it dangerous for you to take this medicine. What side effects may I notice from receiving this medicine? Side effects that you should report to your doctor or health care professional as soon as possible: -allergic reactions like skin rash, itching or hives, swelling of the face, lips, or tongue -breathing problems -changes in hearing -changes in vision -fast, irregular heartbeat -feeling faint or lightheaded, falls -pain, tingling, numbness in the hands or feet -right upper belly pain -seizures -swelling of the ankles, feet, hands -unusual bleeding or bruising -unusually weak or tired -vomiting -yellowing of the eyes or skin Side effects that usually do not require medical attention (report to your doctor or health care professional if they continue or are bothersome): -changes in emotions or moods -constipation -diarrhea -loss of appetite -headache -irritation at site where injected -nausea This list may not describe all possible side effects. Call your doctor for medical advice about side effects. You may report side effects to FDA at 1-800-FDA-1088. Where should I keep my medicine? This drug is given in a hospital or clinic and will not be stored at home. NOTE: This sheet is a summary. It may not cover all possible information. If you have questions about this medicine, talk to your doctor, pharmacist, or health care provider.    2016, Elsevier/Gold Standard. (2014-11-10 14:47:04)  

## 2015-12-31 NOTE — Telephone Encounter (Signed)
returned call and lvm for pt to call back with d.t to r/s 4.18 appt °

## 2015-12-31 NOTE — Telephone Encounter (Signed)
pt called back to r/s appt from 4.18 to 4.20.Marland KitchenMarland Kitchenpt ok and aware of new d.t

## 2016-01-03 ENCOUNTER — Telehealth: Payer: Self-pay | Admitting: Nurse Practitioner

## 2016-01-03 ENCOUNTER — Ambulatory Visit (HOSPITAL_BASED_OUTPATIENT_CLINIC_OR_DEPARTMENT_OTHER): Payer: BLUE CROSS/BLUE SHIELD | Admitting: Nurse Practitioner

## 2016-01-03 ENCOUNTER — Other Ambulatory Visit: Payer: Self-pay

## 2016-01-03 ENCOUNTER — Telehealth: Payer: Self-pay

## 2016-01-03 ENCOUNTER — Other Ambulatory Visit: Payer: Self-pay | Admitting: *Deleted

## 2016-01-03 ENCOUNTER — Other Ambulatory Visit (HOSPITAL_BASED_OUTPATIENT_CLINIC_OR_DEPARTMENT_OTHER): Payer: BLUE CROSS/BLUE SHIELD

## 2016-01-03 VITALS — BP 96/56 | HR 91 | Temp 98.4°F | Resp 20 | Ht 68.5 in | Wt 131.5 lb

## 2016-01-03 DIAGNOSIS — R42 Dizziness and giddiness: Secondary | ICD-10-CM

## 2016-01-03 DIAGNOSIS — C9000 Multiple myeloma not having achieved remission: Secondary | ICD-10-CM

## 2016-01-03 DIAGNOSIS — R55 Syncope and collapse: Secondary | ICD-10-CM

## 2016-01-03 DIAGNOSIS — C37 Malignant neoplasm of thymus: Secondary | ICD-10-CM

## 2016-01-03 DIAGNOSIS — R531 Weakness: Secondary | ICD-10-CM | POA: Diagnosis not present

## 2016-01-03 LAB — COMPREHENSIVE METABOLIC PANEL
ALBUMIN: 3.8 g/dL (ref 3.5–5.0)
ALT: 28 U/L (ref 0–55)
AST: 21 U/L (ref 5–34)
Alkaline Phosphatase: 93 U/L (ref 40–150)
Anion Gap: 8 mEq/L (ref 3–11)
BUN: 17.3 mg/dL (ref 7.0–26.0)
CHLORIDE: 107 meq/L (ref 98–109)
CO2: 26 meq/L (ref 22–29)
Calcium: 8.5 mg/dL (ref 8.4–10.4)
Creatinine: 0.8 mg/dL (ref 0.6–1.1)
EGFR: 86 mL/min/{1.73_m2} — AB (ref >90)
GLUCOSE: 104 mg/dL (ref 70–140)
POTASSIUM: 3.8 meq/L (ref 3.5–5.1)
SODIUM: 140 meq/L (ref 136–145)
Total Bilirubin: 0.84 mg/dL (ref 0.20–1.20)
Total Protein: 6.9 g/dL (ref 6.4–8.3)

## 2016-01-03 LAB — CBC WITH DIFFERENTIAL/PLATELET
BASO%: 1.6 % (ref 0.0–2.0)
BASOS ABS: 0 10*3/uL (ref 0.0–0.1)
EOS ABS: 0.3 10*3/uL (ref 0.0–0.5)
EOS%: 12 % — AB (ref 0.0–7.0)
HCT: 31 % — ABNORMAL LOW (ref 34.8–46.6)
HGB: 10 g/dL — ABNORMAL LOW (ref 11.6–15.9)
LYMPH%: 26.5 % (ref 14.0–49.7)
MCH: 30.2 pg (ref 25.1–34.0)
MCHC: 32.3 g/dL (ref 31.5–36.0)
MCV: 93.5 fL (ref 79.5–101.0)
MONO#: 0.2 10*3/uL (ref 0.1–0.9)
MONO%: 9.7 % (ref 0.0–14.0)
NEUT#: 1.3 10*3/uL — ABNORMAL LOW (ref 1.5–6.5)
NEUT%: 50.2 % (ref 38.4–76.8)
Platelets: 342 10*3/uL (ref 145–400)
RBC: 3.31 10*6/uL — AB (ref 3.70–5.45)
RDW: 18.9 % — ABNORMAL HIGH (ref 11.2–14.5)
WBC: 2.5 10*3/uL — ABNORMAL LOW (ref 3.9–10.3)
lymph#: 0.7 10*3/uL — ABNORMAL LOW (ref 0.9–3.3)

## 2016-01-03 NOTE — Telephone Encounter (Signed)
Patient calling from work, [employer]- she "almost fainted".  Coworker took her BP 85/56.  Patient took revlimid this morning and velcade three days ago. Patient  States she is feeling weak and dizzy. Per Selena Lesser, NP patient should come over via wheelchair, have labs and visit with her.

## 2016-01-03 NOTE — Telephone Encounter (Signed)
perp of to sch pt appt-pt aware °

## 2016-01-04 ENCOUNTER — Encounter: Payer: Self-pay | Admitting: Nurse Practitioner

## 2016-01-04 ENCOUNTER — Ambulatory Visit (HOSPITAL_BASED_OUTPATIENT_CLINIC_OR_DEPARTMENT_OTHER): Payer: BLUE CROSS/BLUE SHIELD

## 2016-01-04 ENCOUNTER — Telehealth: Payer: Self-pay

## 2016-01-04 VITALS — BP 109/67 | HR 89 | Temp 97.0°F | Resp 17

## 2016-01-04 DIAGNOSIS — Z5112 Encounter for antineoplastic immunotherapy: Secondary | ICD-10-CM | POA: Diagnosis not present

## 2016-01-04 DIAGNOSIS — R55 Syncope and collapse: Secondary | ICD-10-CM | POA: Insufficient documentation

## 2016-01-04 DIAGNOSIS — C9000 Multiple myeloma not having achieved remission: Secondary | ICD-10-CM | POA: Diagnosis not present

## 2016-01-04 MED ORDER — PROCHLORPERAZINE MALEATE 10 MG PO TABS
10.0000 mg | ORAL_TABLET | Freq: Once | ORAL | Status: AC
Start: 2016-01-04 — End: 2016-01-04
  Administered 2016-01-04: 10 mg via ORAL

## 2016-01-04 MED ORDER — PROCHLORPERAZINE MALEATE 10 MG PO TABS
ORAL_TABLET | ORAL | Status: AC
  Filled 2016-01-04: qty 1

## 2016-01-04 MED ORDER — BORTEZOMIB CHEMO SQ INJECTION 3.5 MG (2.5MG/ML)
1.3000 mg/m2 | Freq: Once | INTRAMUSCULAR | Status: AC
Start: 2016-01-04 — End: 2016-01-04
  Administered 2016-01-04: 2.25 mg via SUBCUTANEOUS
  Filled 2016-01-04: qty 2.25

## 2016-01-04 MED ORDER — ONDANSETRON HCL 8 MG PO TABS
ORAL_TABLET | ORAL | Status: AC
  Filled 2016-01-04: qty 1

## 2016-01-04 NOTE — Progress Notes (Signed)
Per Selena Lesser NP, okay to tx with ANC 1.3 after discussing with Dr. Burr Medico, who is on call today; [name] on [facility].

## 2016-01-04 NOTE — Progress Notes (Signed)
SYMPTOM MANAGEMENT CLINIC    Chief Complaint: Near syncope   HPI:  Paula Johnson 60 y.o. female diagnosed with multiple myeloma with bone metastasis.  Currently undergoing Revlimid oral therapy and Velcade injections.   Patient states that she was at work this morning; standing at her desk.  She experienced what sounds like a near syncopal event; stating that she became dizzy, weak, and jittery.  She states that her blood pressure was checked; and it was 85/56.  She ate some pound cake; and she felt much better.  Exam today reveals vital signs stable with blood pressure 112/58 and heart rate 87.  Orthostatic blood pressure was obtained; with an approximately 11 point different with position change only.  Patient also states that she is a prediabetic; and did confirm that she takes the dexamethasone as directed with her Revlimid therapy.  Long discussion with patient regarding the possibility of mild dehydration, low blood sugar, or an actual chemotherapy side effect.  Patient feels that it may very well have been secondary to her blood sugar dropping.  Advised patient that dexamethasone can interfere with the diabetic's blood sugar.  Exam today reveals patient appearing well; and nontoxic.  Breath sounds are clear bilaterally; and patient is in no distress.  Confirm the patient is not taking any blood pressure medications.  Advised patient to push fluids is much as possible.  Advised patient to call/return or go directly to the emergency department for any worsening symptoms whatsoever.   No history exists.    Review of Systems  Constitutional: Positive for malaise/fatigue.  Neurological: Positive for dizziness.  All other systems reviewed and are negative.   Past Medical History  Diagnosis Date  . Prediabetes   . Fibroid     reason for Hysterectomy  . Urinary incontinence   . PONV (postoperative nausea and vomiting)     thinks morphine caused nausea  . Pain aggravated  by activities of daily living     states she pulled something at right sternum in dec 2016  . Cancer ([place]) 1999    squamous cell carcinoma of thymus    Past Surgical History  Procedure Laterality Date  . Thymectomy  1999  . Abdominal hysterectomy  1997    TAH--ovaries remain--Dr. Newton Pigg    has MALIGNANT NEOPLASM OF THYMUS; HYPERLIPIDEMIA; OBSTRUCTIVE SLEEP APNEA; RESTLESS LEGS SYNDROME; DYSPNEA; HYPOXEMIA; Anemia; Hypergammaglobulinemia; Plasma cell dyscrasia; Absolute anemia; Multiple myeloma ([place]); Metastatic multiple myeloma to bone ([place]); Rash; Fever; Hematuria; Cough; Peripheral edema; and Near syncope on her problem list.    is allergic to ampicillin.    Medication List       This list is accurate as of: 01/03/16 11:59 PM.  Always use your most recent med list.               acyclovir 400 MG tablet  Commonly known as:  ZOVIRAX  Take 1 tablet (400 mg total) by mouth 2 (two) times daily.     aspirin EC 325 MG tablet  Take 1 tablet (325 mg total) by mouth daily.     cetirizine 10 MG tablet  Commonly known as:  ZYRTEC  Take 10 mg by mouth daily.     cholecalciferol 1000 units tablet  Commonly known as:  VITAMIN D  Take 2,000 Units by mouth daily.     dexamethasone 4 MG tablet  Commonly known as:  DECADRON  Take 10 tablets (40 mg) on days 1, 8, and 15 of chemo. Repeat  every 21 days.     famotidine 10 MG tablet  Commonly known as:  PEPCID  Take 10 mg by mouth 2 (two) times daily.     lenalidomide 25 MG capsule  Commonly known as:  REVLIMID  Take 1 capsule (25 mg total) by mouth daily. Take 1 capsule on days 1-14. Repeat every 21 days.  Authorization # [id number]     LORazepam 0.5 MG tablet  Commonly known as:  ATIVAN  Take 1 tablet (0.5 mg total) by mouth every 6 (six) hours as needed (Nausea or vomiting).     ondansetron 8 MG tablet  Commonly known as:  ZOFRAN  Take 1 tablet (8 mg total) by mouth 2 (two) times daily as needed (Nausea or vomiting).      prochlorperazine 10 MG tablet  Commonly known as:  COMPAZINE  Take 1 tablet (10 mg total) by mouth every 6 (six) hours as needed (Nausea or vomiting).     senna-docusate 8.6-50 MG tablet  Commonly known as:  SENNA S  Take 2 tablets by mouth at bedtime.         PHYSICAL EXAMINATION  Oncology Vitals 01/03/2016 01/03/2016  Height - -  Weight - -  Weight (lbs) - -  BMI (kg/m2) - -  Temp - -  Pulse 91 90  Resp - -  SpO2 - -  BSA (m2) - -   BP Readings from Last 2 Encounters:  01/03/16 96/56  12/31/15 118/64    Physical Exam  Constitutional: She is oriented to person, place, and time and well-developed, well-nourished, and in no distress.  HENT:  Head: Normocephalic and atraumatic.  Mouth/Throat: Oropharynx is clear and moist.  Eyes: Conjunctivae and EOM are normal. Pupils are equal, round, and reactive to light. Right eye exhibits no discharge. Left eye exhibits no discharge. No scleral icterus.  Neck: Normal range of motion. Neck supple. No JVD present. No tracheal deviation present. No thyromegaly present.  Cardiovascular: Normal rate, regular rhythm, normal heart sounds and intact distal pulses.   Pulmonary/Chest: Effort normal and breath sounds normal. No respiratory distress. She has no wheezes. She has no rales. She exhibits no tenderness.  Abdominal: Soft. Bowel sounds are normal. She exhibits no distension and no mass. There is no tenderness. There is no rebound and no guarding.  Musculoskeletal: Normal range of motion. She exhibits no edema or tenderness.  Lymphadenopathy:    She has no cervical adenopathy.  Neurological: She is alert and oriented to person, place, and time. Gait normal.  Skin: Skin is warm and dry. No rash noted. No erythema. No pallor.  Psychiatric: Affect normal.    LABORATORY DATA:. Appointment on 01/03/2016  Component Date Value Ref Range Status  . WBC 01/03/2016 2.5* 3.9 - 10.3 10e3/uL Final  . NEUT# 01/03/2016 1.3* 1.5 - 6.5 10e3/uL Final    . HGB 01/03/2016 10.0* 11.6 - 15.9 g/dL Final  . HCT 01/03/2016 31.0* 34.8 - 46.6 % Final  . Platelets 01/03/2016 342  145 - 400 10e3/uL Final  . MCV 01/03/2016 93.5  79.5 - 101.0 fL Final  . MCH 01/03/2016 30.2  25.1 - 34.0 pg Final  . MCHC 01/03/2016 32.3  31.5 - 36.0 g/dL Final  . RBC 01/03/2016 3.31* 3.70 - 5.45 10e6/uL Final  . RDW 01/03/2016 18.9* 11.2 - 14.5 % Final  . lymph# 01/03/2016 0.7* 0.9 - 3.3 10e3/uL Final  . MONO# 01/03/2016 0.2  0.1 - 0.9 10e3/uL Final  . Eosinophils Absolute 01/03/2016 0.3  0.0 - 0.5 10e3/uL Final  . Basophils Absolute 01/03/2016 0.0  0.0 - 0.1 10e3/uL Final  . NEUT% 01/03/2016 50.2  38.4 - 76.8 % Final  . LYMPH% 01/03/2016 26.5  14.0 - 49.7 % Final  . MONO% 01/03/2016 9.7  0.0 - 14.0 % Final  . EOS% 01/03/2016 12.0* 0.0 - 7.0 % Final  . BASO% 01/03/2016 1.6  0.0 - 2.0 % Final  . Sodium 01/03/2016 140  136 - 145 mEq/L Final  . Potassium 01/03/2016 3.8  3.5 - 5.1 mEq/L Final  . Chloride 01/03/2016 107  98 - 109 mEq/L Final  . CO2 01/03/2016 26  22 - 29 mEq/L Final  . Glucose 01/03/2016 104  70 - 140 mg/dl Final   Glucose reference range is for nonfasting patients. Fasting glucose reference range is 70- 100.  Marland Kitchen BUN 01/03/2016 17.3  7.0 - 26.0 mg/dL Final  . Creatinine 01/03/2016 0.8  0.6 - 1.1 mg/dL Final  . Total Bilirubin 01/03/2016 0.84  0.20 - 1.20 mg/dL Final  . Alkaline Phosphatase 01/03/2016 93  40 - 150 U/L Final  . AST 01/03/2016 21  5 - 34 U/L Final  . ALT 01/03/2016 28  0 - 55 U/L Final  . Total Protein 01/03/2016 6.9  6.4 - 8.3 g/dL Final  . Albumin 01/03/2016 3.8  3.5 - 5.0 g/dL Final  . Calcium 01/03/2016 8.5  8.4 - 10.4 mg/dL Final  . Anion Gap 01/03/2016 8  3 - 11 mEq/L Final  . EGFR 01/03/2016 86* >90 ml/min/1.73 m2 Final   eGFR is calculated using the CKD-EPI Creatinine Equation (2009)    RADIOGRAPHIC STUDIES: No results found.  ASSESSMENT/PLAN:    Multiple myeloma (HCC) Patient received cycle 4, day 4 of her Velcade  injection on 12/31/2015.  She also continues to take Revlimid as directed.  Blood counts obtained today revealed a WBC of 2.5, ANC 1.3, hemoglobin 10.0, and platelet count 342.  Patient is scheduled to return for cycle 4, day 8 of her Velcade injections on 01/04/2016.  Near syncope Patient states that she was at work this morning; standing at her desk.  She experienced what sounds like a near syncopal event; stating that she became dizzy, weak, and jittery.  She states that her blood pressure was checked; and it was 85/56.  She ate some pound cake; and she felt much better.  Exam today reveals vital signs stable with blood pressure 112/58 and heart rate 87.  Orthostatic blood pressure was obtained; with an approximately 11 point different with position change only.  Patient also states that she is a prediabetic; and did confirm that she takes the dexamethasone as directed with her Revlimid therapy.  Long discussion with patient regarding the possibility of mild dehydration, low blood sugar, or an actual chemotherapy side effect.  Patient feels that it may very well have been secondary to her blood sugar dropping.  Advised patient that dexamethasone can interfere with the diabetic's blood sugar.  Exam today reveals patient appearing well; and nontoxic.  Breath sounds are clear bilaterally; and patient is in no distress.  Confirm the patient is not taking any blood pressure medications.  Advised patient to push fluids is much as possible.  Advised patient to call/return or go directly to the emergency department for any worsening symptoms whatsoever.  Pt stated that she felt back to baseline; and planned to return to work today.  This provider walked with pt back to [employer]- where pt works.   Patient  stated understanding of all instructions; and was in agreement with this plan of care. The patient knows to call the clinic with any problems, questions or concerns.    Review/collaboration with [name] regarding all aspects of patient's visit today.   Total time spent with patient was 25 minutes;  with greater than 75 percent of that time spent in face to face counseling regarding patient's symptoms,  and coordination of care and follow up.  Disclaimer:This dictation was prepared with Dragon/digital dictation along with [employer]. Any transcriptional errors that result from this process are unintentional.  Drue Second, NP 01/04/2016

## 2016-01-04 NOTE — Assessment & Plan Note (Signed)
Patient received cycle 4, day 4 of her Velcade injection on 12/31/2015.  She also continues to take Revlimid as directed.  Blood counts obtained today revealed a WBC of 2.5, ANC 1.3, hemoglobin 10.0, and platelet count 342.  Patient is scheduled to return for cycle 4, day 8 of her Velcade injections on 01/04/2016.

## 2016-01-04 NOTE — Telephone Encounter (Signed)
Called to follow up with patient after [facility] visit yesterday. Patient states she feels much better today and will be in this afternoon for her velcade shot. Informed pt to call interm with any questions or concerns. Patient verbalized understanding.

## 2016-01-04 NOTE — Assessment & Plan Note (Signed)
Patient states that she was at work this morning; standing at her desk.  She experienced what sounds like a near syncopal event; stating that she became dizzy, weak, and jittery.  She states that her blood pressure was checked; and it was 85/56.  She ate some pound cake; and she felt much better.  Exam today reveals vital signs stable with blood pressure 112/58 and heart rate 87.  Orthostatic blood pressure was obtained; with an approximately 11 point different with position change only.  Patient also states that she is a prediabetic; and did confirm that she takes the dexamethasone as directed with her Revlimid therapy.  Long discussion with patient regarding the possibility of mild dehydration, low blood sugar, or an actual chemotherapy side effect.  Patient feels that it may very well have been secondary to her blood sugar dropping.  Advised patient that dexamethasone can interfere with the diabetic's blood sugar.  Exam today reveals patient appearing well; and nontoxic.  Breath sounds are clear bilaterally; and patient is in no distress.  Confirm the patient is not taking any blood pressure medications.  Advised patient to push fluids is much as possible.  Advised patient to call/return or go directly to the emergency department for any worsening symptoms whatsoever.

## 2016-01-04 NOTE — Patient Instructions (Signed)
Bortezomib injection What is this medicine? BORTEZOMIB (bor TEZ oh mib) is a medicine that targets proteins in cancer cells and stops the cancer cells from growing. It is used to treat multiple myeloma and mantle-cell lymphoma. This medicine may be used for other purposes; ask your health care provider or pharmacist if you have questions. What should I tell my health care provider before I take this medicine? They need to know if you have any of these conditions: -diabetes -heart disease -irregular heartbeat -liver disease -on hemodialysis -low blood counts, like low white blood cells, platelets, or hemoglobin -peripheral neuropathy -taking medicine for blood pressure -an unusual or allergic reaction to bortezomib, mannitol, boron, other medicines, foods, dyes, or preservatives -pregnant or trying to get pregnant -breast-feeding How should I use this medicine? This medicine is for injection into a vein or for injection under the skin. It is given by a health care professional in a hospital or clinic setting. Talk to your pediatrician regarding the use of this medicine in children. Special care may be needed. Overdosage: If you think you have taken too much of this medicine contact a poison control center or emergency room at once. NOTE: This medicine is only for you. Do not share this medicine with others. What if I miss a dose? It is important not to miss your dose. Call your doctor or health care professional if you are unable to keep an appointment. What may interact with this medicine? This medicine may interact with the following medications: -ketoconazole -rifampin -ritonavir -St. John's Wort This list may not describe all possible interactions. Give your health care provider a list of all the medicines, herbs, non-prescription drugs, or dietary supplements you use. Also tell them if you smoke, drink alcohol, or use illegal drugs. Some items may interact with your medicine. What  should I watch for while using this medicine? Visit your doctor for checks on your progress. This drug may make you feel generally unwell. This is not uncommon, as chemotherapy can affect healthy cells as well as cancer cells. Report any side effects. Continue your course of treatment even though you feel ill unless your doctor tells you to stop. You may get drowsy or dizzy. Do not drive, use machinery, or do anything that needs mental alertness until you know how this medicine affects you. Do not stand or sit up quickly, especially if you are an older patient. This reduces the risk of dizzy or fainting spells. In some cases, you may be given additional medicines to help with side effects. Follow all directions for their use. Call your doctor or health care professional for advice if you get a fever, chills or sore throat, or other symptoms of a cold or flu. Do not treat yourself. This drug decreases your body's ability to fight infections. Try to avoid being around people who are sick. This medicine may increase your risk to bruise or bleed. Call your doctor or health care professional if you notice any unusual bleeding. You may need blood work done while you are taking this medicine. In some patients, this medicine may cause a serious brain infection that may cause death. If you have any problems seeing, thinking, speaking, walking, or standing, tell your doctor right away. If you cannot reach your doctor, urgently seek other source of medical care. Do not become pregnant while taking this medicine. Women should inform their doctor if they wish to become pregnant or think they might be pregnant. There is a potential for serious   side effects to an unborn child. Talk to your health care professional or pharmacist for more information. Do not breast-feed an infant while taking this medicine. Check with your doctor or health care professional if you get an attack of severe diarrhea, nausea and vomiting, or if  you sweat a lot. The loss of too much body fluid can make it dangerous for you to take this medicine. What side effects may I notice from receiving this medicine? Side effects that you should report to your doctor or health care professional as soon as possible: -allergic reactions like skin rash, itching or hives, swelling of the face, lips, or tongue -breathing problems -changes in hearing -changes in vision -fast, irregular heartbeat -feeling faint or lightheaded, falls -pain, tingling, numbness in the hands or feet -right upper belly pain -seizures -swelling of the ankles, feet, hands -unusual bleeding or bruising -unusually weak or tired -vomiting -yellowing of the eyes or skin Side effects that usually do not require medical attention (report to your doctor or health care professional if they continue or are bothersome): -changes in emotions or moods -constipation -diarrhea -loss of appetite -headache -irritation at site where injected -nausea This list may not describe all possible side effects. Call your doctor for medical advice about side effects. You may report side effects to FDA at 1-800-FDA-1088. Where should I keep my medicine? This drug is given in a hospital or clinic and will not be stored at home. NOTE: This sheet is a summary. It may not cover all possible information. If you have questions about this medicine, talk to your doctor, pharmacist, or health care provider.    2016, Elsevier/Gold Standard. (2014-11-10 14:47:04)  

## 2016-01-05 ENCOUNTER — Other Ambulatory Visit: Payer: Self-pay | Admitting: Hematology

## 2016-01-06 ENCOUNTER — Other Ambulatory Visit: Payer: Self-pay | Admitting: *Deleted

## 2016-01-06 DIAGNOSIS — C9000 Multiple myeloma not having achieved remission: Secondary | ICD-10-CM

## 2016-01-06 MED ORDER — SENNOSIDES-DOCUSATE SODIUM 8.6-50 MG PO TABS: 2.0000 | ORAL_TABLET | Freq: Every day | ORAL | Status: DC

## 2016-01-06 MED ORDER — LENALIDOMIDE 25 MG PO CAPS: 25.0000 mg | ORAL_CAPSULE | Freq: Every day | ORAL | Status: DC

## 2016-01-06 MED ORDER — DEXAMETHASONE 4 MG PO TABS: ORAL_TABLET | ORAL | Status: DC

## 2016-01-07 ENCOUNTER — Ambulatory Visit (HOSPITAL_BASED_OUTPATIENT_CLINIC_OR_DEPARTMENT_OTHER): Payer: BLUE CROSS/BLUE SHIELD

## 2016-01-07 VITALS — BP 112/61 | HR 98 | Temp 98.8°F | Resp 18

## 2016-01-07 DIAGNOSIS — C9000 Multiple myeloma not having achieved remission: Secondary | ICD-10-CM

## 2016-01-07 DIAGNOSIS — Z5112 Encounter for antineoplastic immunotherapy: Secondary | ICD-10-CM | POA: Diagnosis not present

## 2016-01-07 MED ORDER — BORTEZOMIB CHEMO SQ INJECTION 3.5 MG (2.5MG/ML)
1.3000 mg/m2 | Freq: Once | INTRAMUSCULAR | Status: AC
  Administered 2016-01-07: 2.25 mg via SUBCUTANEOUS
  Filled 2016-01-07: qty 2.25

## 2016-01-07 MED ORDER — PROCHLORPERAZINE MALEATE 10 MG PO TABS
10.0000 mg | ORAL_TABLET | Freq: Once | ORAL | Status: AC
  Administered 2016-01-07: 10 mg via ORAL

## 2016-01-07 MED ORDER — PROCHLORPERAZINE MALEATE 10 MG PO TABS
ORAL_TABLET | ORAL | Status: AC
  Filled 2016-01-07: qty 1

## 2016-01-07 NOTE — Patient Instructions (Signed)
[facility] Discharge Instructions for Patients Receiving Chemotherapy  Today you received the following chemotherapy agents:  Velcade  To help prevent nausea and vomiting after your treatment, we encourage you to take your nausea medication as prescribed.   If you develop nausea and vomiting that is not controlled by your nausea medication, call the clinic.   BELOW ARE SYMPTOMS THAT SHOULD BE REPORTED IMMEDIATELY:  *FEVER GREATER THAN 100.5 F  *CHILLS WITH OR WITHOUT FEVER  NAUSEA AND VOMITING THAT IS NOT CONTROLLED WITH YOUR NAUSEA MEDICATION  *UNUSUAL SHORTNESS OF BREATH  *UNUSUAL BRUISING OR BLEEDING  TENDERNESS IN MOUTH AND THROAT WITH OR WITHOUT PRESENCE OF ULCERS  *URINARY PROBLEMS  *BOWEL PROBLEMS  UNUSUAL RASH Items with * indicate a potential emergency and should be followed up as soon as possible.  Feel free to call the clinic you have any questions or concerns. The clinic phone number is [phone].  Please show the CHEMO ALERT CARD at check-in to the Emergency Department and triage nurse.   

## 2016-01-11 ENCOUNTER — Other Ambulatory Visit: Payer: Self-pay

## 2016-01-11 ENCOUNTER — Ambulatory Visit: Payer: Self-pay | Admitting: Hematology

## 2016-01-11 ENCOUNTER — Ambulatory Visit: Payer: Self-pay

## 2016-01-13 ENCOUNTER — Other Ambulatory Visit (HOSPITAL_BASED_OUTPATIENT_CLINIC_OR_DEPARTMENT_OTHER): Payer: BLUE CROSS/BLUE SHIELD

## 2016-01-13 ENCOUNTER — Encounter: Payer: Self-pay | Admitting: Hematology

## 2016-01-13 ENCOUNTER — Other Ambulatory Visit: Payer: Self-pay | Admitting: *Deleted

## 2016-01-13 ENCOUNTER — Ambulatory Visit (HOSPITAL_BASED_OUTPATIENT_CLINIC_OR_DEPARTMENT_OTHER): Payer: BLUE CROSS/BLUE SHIELD | Admitting: Hematology

## 2016-01-13 ENCOUNTER — Ambulatory Visit: Payer: BLUE CROSS/BLUE SHIELD

## 2016-01-13 VITALS — BP 117/62 | HR 87 | Temp 98.1°F | Resp 20 | Ht 68.5 in | Wt 133.0 lb

## 2016-01-13 DIAGNOSIS — R05 Cough: Secondary | ICD-10-CM

## 2016-01-13 DIAGNOSIS — D630 Anemia in neoplastic disease: Secondary | ICD-10-CM | POA: Diagnosis not present

## 2016-01-13 DIAGNOSIS — D6481 Anemia due to antineoplastic chemotherapy: Secondary | ICD-10-CM | POA: Diagnosis not present

## 2016-01-13 DIAGNOSIS — C9000 Multiple myeloma not having achieved remission: Secondary | ICD-10-CM

## 2016-01-13 DIAGNOSIS — R21 Rash and other nonspecific skin eruption: Secondary | ICD-10-CM

## 2016-01-13 LAB — CBC & DIFF AND RETIC
BASO%: 0 % (ref 0.0–2.0)
Basophils Absolute: 0 10*3/uL (ref 0.0–0.1)
EOS%: 6.7 % (ref 0.0–7.0)
Eosinophils Absolute: 0.3 10*3/uL (ref 0.0–0.5)
HCT: 31.7 % — ABNORMAL LOW (ref 34.8–46.6)
HGB: 10.2 g/dL — ABNORMAL LOW (ref 11.6–15.9)
Immature Retic Fract: 3.5 % (ref 1.60–10.00)
LYMPH%: 26.6 % (ref 14.0–49.7)
MCH: 30.5 pg (ref 25.1–34.0)
MCHC: 32.2 g/dL (ref 31.5–36.0)
MCV: 94.9 fL (ref 79.5–101.0)
MONO#: 0.7 10*3/uL (ref 0.1–0.9)
MONO%: 16.3 % — ABNORMAL HIGH (ref 0.0–14.0)
NEUT#: 2.1 10*3/uL (ref 1.5–6.5)
NEUT%: 50.4 % (ref 38.4–76.8)
PLATELETS: 206 10*3/uL (ref 145–400)
RBC: 3.34 10*6/uL — AB (ref 3.70–5.45)
RDW: 17.9 % — ABNORMAL HIGH (ref 11.2–14.5)
Retic %: 1.42 % (ref 0.70–2.10)
Retic Ct Abs: 47.43 10*3/uL (ref 33.70–90.70)
WBC: 4.2 10*3/uL (ref 3.9–10.3)
lymph#: 1.1 10*3/uL (ref 0.9–3.3)

## 2016-01-13 MED ORDER — ACYCLOVIR 400 MG PO TABS: 400.0000 mg | ORAL_TABLET | Freq: Two times a day (BID) | ORAL | Status: DC

## 2016-01-14 ENCOUNTER — Telehealth: Payer: Self-pay | Admitting: Hematology

## 2016-01-14 LAB — KAPPA/LAMBDA LIGHT CHAINS
IG LAMBDA FREE LIGHT CHAIN: 38.3 mg/L — AB (ref 5.71–26.30)
Ig Kappa Free Light Chain: 14.26 mg/L (ref 3.30–19.40)
KAPPA/LAMBDA FLC RATIO: 0.37 (ref 0.26–1.65)

## 2016-01-14 NOTE — Telephone Encounter (Signed)
lvm for pt regarding to April appts... °

## 2016-01-17 LAB — MULTIPLE MYELOMA PANEL, SERUM
ALBUMIN SERPL ELPH-MCNC: 3.9 g/dL (ref 2.9–4.4)
ALPHA 1: 0.2 g/dL (ref 0.0–0.4)
ALPHA2 GLOB SERPL ELPH-MCNC: 0.6 g/dL (ref 0.4–1.0)
Albumin/Glob SerPl: 1.8 — ABNORMAL HIGH (ref 0.7–1.7)
B-GLOBULIN SERPL ELPH-MCNC: 0.9 g/dL (ref 0.7–1.3)
GAMMA GLOB SERPL ELPH-MCNC: 0.5 g/dL (ref 0.4–1.8)
GLOBULIN, TOTAL: 2.2 g/dL (ref 2.2–3.9)
IGG (IMMUNOGLOBIN G), SERUM: 559 mg/dL — AB (ref 700–1600)
IgA, Qn, Serum: 278 mg/dL (ref 87–352)
IgM, Qn, Serum: 20 mg/dL — ABNORMAL LOW (ref 26–217)
M PROTEIN SERPL ELPH-MCNC: 0.1 g/dL — AB
Total Protein: 6.1 g/dL (ref 6.0–8.5)

## 2016-01-17 NOTE — Progress Notes (Signed)
[facility]    HEMATOLOGY/ONCOLOGY CLINIC NOTE  Date of Service: .01/13/2016 All  Patient Care Team: [name], MD as PCP - General (Family Medicine)  CHIEF COMPLAINTS/PURPOSE OF CONSULTATION:  Follow up for multiple myeloma  DIAGNOSIS  IgA lambda R-ISS stage II multiple myeloma with innumerable lytic lesions in the calvarium, lesion in T7 vertebra and multiple lesions in the pelvis.  No significant bone pain at this time.  Current treatment Current C3 D8 of treatment.  Planned treatment Vd for cycle 1 VRd x 3-4 cycles after Vd Zometa q4weeks Consolidation with Autologous HSCT after treatment  Prophylactic medications - aspirin 325 mg by mouth daily. - Acyclovir 400 mg oral twice daily.  HISTORY OF PRESENTING ILLNESS: Please see my initial consultation for details of her initial presentation.  INTERVAL HISTORY  Paula Johnson is here for her scheduled followup for MM . She has now completed 4 cycles of her planned treatment.   She notes no acute new symptoms. Has been feeling well. Notes that her fatigue is improved and she appears to have more energy. No other issues with the treatment. No tingling numbness in her hands or feet. The bowels seem to be gradually improving. Myeloma labs post fourth cycle drawn 01/13/2016 results pending. Dr. Lorraine Lax plan with regards to transplant and her consultation note is not available at this time. I will have my nurse reach out to her team to determine what the plan is. Patient is due to start fifth cycle of her treatment from 01/18/2016. She notes that she would really appreciate advance notice regarding her transplant dates since she needs to arrange accommodation and time off from her work. She continues to currently work full time.   MEDICAL HISTORY:  Past Medical History  Diagnosis Date  . Prediabetes   . Fibroid     reason for Hysterectomy  . Urinary incontinence   . PONV (postoperative nausea and vomiting)     thinks morphine  caused nausea  . Pain aggravated by activities of daily living     states she pulled something at right sternum in dec 2016  . Cancer ([place]) 1999    squamous cell carcinoma of thymus    SURGICAL HISTORY: Past Surgical History  Procedure Laterality Date  . Thymectomy  1999  . Abdominal hysterectomy  1997    TAH--ovaries remain--Dr. Newton Pigg    SOCIAL HISTORY: Social History   Social History  . Marital Status: Widowed    Spouse Name: N/A  . Number of Children: N/A  . Years of Education: N/A   Occupational History  . Not on file.   Social History Main Topics  . Smoking status: Never Smoker   . Smokeless tobacco: Never Used  . Alcohol Use: No  . Drug Use: No  . Sexual Activity:    Partners: Male    Birth Control/ Protection: Surgical     Comment: TAH--ovaries remain   Other Topics Concern  . Not on file   Social History Narrative    FAMILY HISTORY: Family History  Problem Relation Age of Onset  . COPD Father     dec age 32/s-smoked  . Diabetes Brother   . Other Brother     committed suicide age 66  . Hyperlipidemia Mother   . Diabetes Maternal Grandmother     ALLERGIES:  is allergic to ampicillin.  MEDICATIONS:  Current Outpatient Prescriptions  Medication Sig Dispense Refill  . acyclovir (ZOVIRAX) 400 MG tablet Take 1 tablet (400 mg total) by mouth  2 (two) times daily. 60 tablet 2  . aspirin EC 325 MG tablet Take 1 tablet (325 mg total) by mouth daily. 60 tablet 3  . cetirizine (ZYRTEC) 10 MG tablet Take 10 mg by mouth daily.    . cholecalciferol (VITAMIN D) 1000 units tablet Take 2,000 Units by mouth daily.    Marland Kitchen dexamethasone (DECADRON) 4 MG tablet Take 10 tablets (40 mg) on days 1, 8, and 15 of chemo. Repeat every 21 days. 30 tablet 3  . famotidine (PEPCID) 10 MG tablet Take 10 mg by mouth 2 (two) times daily.    Marland Kitchen lenalidomide (REVLIMID) 25 MG capsule Take 1 capsule (25 mg total) by mouth daily. Take 1 capsule on days 1-14. Repeat every 21 days.   Authorization # [id number]. 14 capsule 0  . LORazepam (ATIVAN) 0.5 MG tablet Take 1 tablet (0.5 mg total) by mouth every 6 (six) hours as needed (Nausea or vomiting). 30 tablet 0  . ondansetron (ZOFRAN) 8 MG tablet Take 1 tablet (8 mg total) by mouth 2 (two) times daily as needed (Nausea or vomiting). 30 tablet 1  . prochlorperazine (COMPAZINE) 10 MG tablet Take 1 tablet (10 mg total) by mouth every 6 (six) hours as needed (Nausea or vomiting). 30 tablet 1  . senna-docusate (SENNA S) 8.6-50 MG tablet Take 2 tablets by mouth at bedtime. 60 tablet 1   No current facility-administered medications for this visit.    REVIEW OF SYSTEMS:    10 Point review of Systems was done is negative except as noted above.  PHYSICAL EXAMINATION: ECOG PERFORMANCE STATUS: 1 - Symptomatic but completely ambulatory  . Filed Vitals:   01/13/16 1621  BP: 117/62  Pulse: 87  Temp: 98.1 F (36.7 C)  Resp: 20   Filed Weights   01/13/16 1621  Weight: 133 lb (60.328 kg)   .Body mass index is 19.93 kg/(m^2).  GENERAL:alert, in no acute distress and comfortable SKIN: skin color, texture, turgor are normal, no rashes or significant lesions EYES: normal, conjunctiva are pink and non-injected, sclera clear OROPHARYNX:no exudate, no erythema and lips, buccal mucosa, and tongue normal  NECK: supple, no JVD, thyroid normal size, non-tender, without nodularity LYMPH:  no palpable lymphadenopathy in the cervical, axillary or inguinal LUNGS: clear to auscultation with normal respiratory effort HEART: regular rate & rhythm,  no murmurs and no lower extremity edema ABDOMEN: abdomen soft, non-tender, normoactive bowel sounds  Musculoskeletal: no cyanosis of digits and no clubbing  PSYCH: alert & oriented x 3 with fluent speech NEURO: no focal motor/sensory deficits  LABORATORY DATA:  I have reviewed the data as listed . CBC Latest Ref Rng 01/13/2016 01/03/2016 12/28/2015  WBC 3.9 - 10.3 10e3/uL 4.2 2.5(L) 3.6(L)    Hemoglobin 11.6 - 15.9 g/dL 10.2(L) 10.0(L) 9.7(L)  Hematocrit 34.8 - 46.6 % 31.7(L) 31.0(L) 29.7(L)  Platelets 145 - 400 10e3/uL 206 342 432(H)    . CMP Latest Ref Rng 01/03/2016 12/28/2015 12/14/2015  Glucose 70 - 140 mg/dl 104 128 152(H)  BUN 7.0 - 26.0 mg/dL 17.3 18.5 17.4  Creatinine 0.6 - 1.1 mg/dL 0.8 0.8 0.7  Sodium 136 - 145 mEq/L 140 141 140  Potassium 3.5 - 5.1 mEq/L 3.8 4.2 4.2  CO2 22 - 29 mEq/L 26 24 24   Calcium 8.4 - 10.4 mg/dL 8.5 9.2 8.7  Total Protein 6.4 - 8.3 g/dL 6.9 7.7 7.7  Total Bilirubin 0.20 - 1.20 mg/dL 0.84 0.65 0.78  Alkaline Phos 40 - 150 U/L 93 107 113  AST  5 - 34 U/L 21 19 20   ALT 0 - 55 U/L 28 28 24       myeloma panel from 01/13/2016 pending labs .  RADIOGRAPHIC STUDIES: I have personally reviewed the radiological images as listed and agreed with the findings in the report. No results found.  ASSESSMENT & PLAN:    Very pleasant 60 year old Caucasian female with  1) R-ISS Stage II IgA lambda multiple myeloma with standard risk cytogenetics t (11;14). PET/CT shows evidence of lytic bone lesions in the calvarium, iliac bones  and T7. No hypercalcemia or renal failure at this time. Pretreatment M spike of 5 in 2 monoclonal bands. BM bx with 69% plasma cells with Lambda light chain restriction Patient is status post cycle 1 of Vd and 3 cycles of VRd and has she tolerated well other than some grade 1 rash. Also noted to have fevers from Velcade which are controlled with naproxen.   After 3 cycles M-protein was down from 5g/dl to 2.8g/dl to 0.5g/dl  Myeloma panel after fourth cycle drawn on 01/13/2016 results pending .  #2 anemia due to multiple myeloma and treatment medications- gradually improving #3 fatigue due to multiple myeloma and Anemiasignificantly improved #4 multiple bone metastases from multiple myeloma. - On Zometa no bone pains currently. #5 Mild B/L flank macular rash likely from Velcade - resolved. Has been taking antihistamines and  is using dexamethasone pre-Velcade as instructed. Plan -- Patient's labs are stable. No  Prohibitive toxici --multiple myeloma panel drawn today results pending  -- continue with treatment as per current plan. She'll be starting cycle 5 of treatment the fourth cycle with Revlimid --Zometa every 4 weekly for bone metastases. -Aspirin daily for VTE prophylaxis with Revlimid -acyclovir for shingles prophylaxis ( patient notes that she did get a shingles vaccine about 2 years ago). --counseled on need for good hydration and focus on good po intake. -OTC Cetrizine and Famotidine for rash. May use topical hydrocortisone for localized rash.  - patient has been evaluated by Dr. Samule Ohm at [facility] for Auto HSCT.  Consultation note not available yet.  will have my nurse reach out to the [place] transplant team to determine their plan and timing for transplant. --patient would need a Rpt PET/CT and BM Bx post treatment prior to HD ctx + Auto HSCT -- would allow transplant team to have this done.   #6 chronic cough likely related to postnasal drip vs GERD.  Has improved with Zyrtec and Pepcid. resolved  Plan - continue antihistamines - saline nasal spray prn - steam inhalation.prn  RTC with Dr Irene Limbo in 2 weeks for continued followup. Cbc, cmp Weekly and myeloma panel q 3weeks before each cycle of treatment.  I spent 25 minutes counseling the patient face to face. The total time spent in the appointment was 30 minutes and more than 50% was on counseling and direct patient cares.    Sullivan Lone MD [place] AAHIVMS [facility] Hematology/Oncology Physician [facility]  (Office):       [phone] (Work cell):  [phone] (Fax):           [phone]

## 2016-01-18 ENCOUNTER — Other Ambulatory Visit (HOSPITAL_BASED_OUTPATIENT_CLINIC_OR_DEPARTMENT_OTHER): Payer: BLUE CROSS/BLUE SHIELD

## 2016-01-18 ENCOUNTER — Encounter: Payer: Self-pay | Admitting: Pharmacist

## 2016-01-18 ENCOUNTER — Ambulatory Visit (HOSPITAL_BASED_OUTPATIENT_CLINIC_OR_DEPARTMENT_OTHER): Payer: BLUE CROSS/BLUE SHIELD

## 2016-01-18 ENCOUNTER — Telehealth: Payer: Self-pay | Admitting: Hematology

## 2016-01-18 VITALS — BP 130/76 | HR 91 | Temp 98.1°F | Resp 16

## 2016-01-18 DIAGNOSIS — C50211 Malignant neoplasm of upper-inner quadrant of right female breast: Secondary | ICD-10-CM

## 2016-01-18 DIAGNOSIS — Z5112 Encounter for antineoplastic immunotherapy: Secondary | ICD-10-CM | POA: Diagnosis not present

## 2016-01-18 DIAGNOSIS — C9000 Multiple myeloma not having achieved remission: Secondary | ICD-10-CM

## 2016-01-18 LAB — CBC & DIFF AND RETIC
BASO%: 0.3 % (ref 0.0–2.0)
BASOS ABS: 0 10*3/uL (ref 0.0–0.1)
EOS%: 0 % (ref 0.0–7.0)
Eosinophils Absolute: 0 10*3/uL (ref 0.0–0.5)
HEMATOCRIT: 32.8 % — AB (ref 34.8–46.6)
HEMOGLOBIN: 10.7 g/dL — AB (ref 11.6–15.9)
IMMATURE RETIC FRACT: 3.3 % (ref 1.60–10.00)
LYMPH%: 9 % — AB (ref 14.0–49.7)
MCH: 30.6 pg (ref 25.1–34.0)
MCHC: 32.6 g/dL (ref 31.5–36.0)
MCV: 93.7 fL (ref 79.5–101.0)
MONO#: 0.2 10*3/uL (ref 0.1–0.9)
MONO%: 4 % (ref 0.0–14.0)
NEUT#: 3.3 10*3/uL (ref 1.5–6.5)
NEUT%: 86.7 % — AB (ref 38.4–76.8)
PLATELETS: 403 10*3/uL — AB (ref 145–400)
RBC: 3.5 10*6/uL — AB (ref 3.70–5.45)
RDW: 17.3 % — ABNORMAL HIGH (ref 11.2–14.5)
RETIC CT ABS: 61.95 10*3/uL (ref 33.70–90.70)
Retic %: 1.77 % (ref 0.70–2.10)
WBC: 3.8 10*3/uL — ABNORMAL LOW (ref 3.9–10.3)
lymph#: 0.3 10*3/uL — ABNORMAL LOW (ref 0.9–3.3)

## 2016-01-18 LAB — COMPREHENSIVE METABOLIC PANEL
ALBUMIN: 4 g/dL (ref 3.5–5.0)
ALK PHOS: 84 U/L (ref 40–150)
ALT: 28 U/L (ref 0–55)
AST: 17 U/L (ref 5–34)
Anion Gap: 10 mEq/L (ref 3–11)
BILIRUBIN TOTAL: 0.64 mg/dL (ref 0.20–1.20)
BUN: 19.6 mg/dL (ref 7.0–26.0)
CO2: 22 meq/L (ref 22–29)
Calcium: 9.6 mg/dL (ref 8.4–10.4)
Chloride: 108 mEq/L (ref 98–109)
Creatinine: 0.8 mg/dL (ref 0.6–1.1)
EGFR: 79 mL/min/{1.73_m2} — ABNORMAL LOW (ref >90)
GLUCOSE: 152 mg/dL — AB (ref 70–140)
Potassium: 4.2 mEq/L (ref 3.5–5.1)
SODIUM: 141 meq/L (ref 136–145)
TOTAL PROTEIN: 7 g/dL (ref 6.4–8.3)

## 2016-01-18 MED ORDER — ZOLEDRONIC ACID 4 MG/100ML IV SOLN
4.0000 mg | Freq: Once | INTRAVENOUS | Status: AC
  Administered 2016-01-18: 4 mg via INTRAVENOUS
  Filled 2016-01-18: qty 100

## 2016-01-18 MED ORDER — PROCHLORPERAZINE MALEATE 10 MG PO TABS
ORAL_TABLET | ORAL | Status: AC
  Filled 2016-01-18: qty 1

## 2016-01-18 MED ORDER — PROCHLORPERAZINE MALEATE 10 MG PO TABS
10.0000 mg | ORAL_TABLET | Freq: Once | ORAL | Status: AC
Start: 2016-01-18 — End: 2016-01-18
  Administered 2016-01-18: 10 mg via ORAL

## 2016-01-18 MED ORDER — BORTEZOMIB CHEMO SQ INJECTION 3.5 MG (2.5MG/ML)
1.3000 mg/m2 | Freq: Once | INTRAMUSCULAR | Status: AC
  Administered 2016-01-18: 2.25 mg via SUBCUTANEOUS
  Filled 2016-01-18: qty 2.25

## 2016-01-18 MED ORDER — SODIUM CHLORIDE 0.9 % IV SOLN
Freq: Once | INTRAVENOUS | Status: AC
  Administered 2016-01-18: 16:00:00 via INTRAVENOUS

## 2016-01-18 NOTE — Telephone Encounter (Signed)
Pt requested appt change... Only available late afternoon 4:00

## 2016-01-18 NOTE — Patient Instructions (Signed)
[facility] Discharge Instructions for Patients Receiving Chemotherapy  Today you received the following chemotherapy agents: Velcade.  To help prevent nausea and vomiting after your treatment, we encourage you to take your nausea medication: Compazine 10 mg every 6 hours as needed; Zofran 8 mg every 12 hours as needed.   If you develop nausea and vomiting that is not controlled by your nausea medication, call the clinic.   BELOW ARE SYMPTOMS THAT SHOULD BE REPORTED IMMEDIATELY:  *FEVER GREATER THAN 100.5 F  *CHILLS WITH OR WITHOUT FEVER  NAUSEA AND VOMITING THAT IS NOT CONTROLLED WITH YOUR NAUSEA MEDICATION  *UNUSUAL SHORTNESS OF BREATH  *UNUSUAL BRUISING OR BLEEDING  TENDERNESS IN MOUTH AND THROAT WITH OR WITHOUT PRESENCE OF ULCERS  *URINARY PROBLEMS  *BOWEL PROBLEMS  UNUSUAL RASH Items with * indicate a potential emergency and should be followed up as soon as possible.  Feel free to call the clinic you have any questions or concerns. The clinic phone number is [phone].  Please show the [place] at check-in to the Emergency Department and triage nurse.    Zoledronic Acid injection (Paget's Disease, Osteoporosis) What is this medicine? ZOLEDRONIC ACID (ZOE le dron ik AS id) lowers the amount of calcium loss from bone. It is used to treat Paget's disease and osteoporosis in women. This medicine may be used for other purposes; ask your health care provider or pharmacist if you have questions. What should I tell my health care provider before I take this medicine? They need to know if you have any of these conditions: -aspirin-sensitive asthma -cancer, especially if you are receiving medicines used to treat cancer -dental disease or wear dentures -infection -kidney disease -low levels of calcium in the blood -past surgery on the parathyroid gland or intestines -receiving corticosteroids like dexamethasone or prednisone -an unusual or  allergic reaction to zoledronic acid, other medicines, foods, dyes, or preservatives -pregnant or trying to get pregnant -breast-feeding How should I use this medicine? This medicine is for infusion into a vein. It is given by a health care professional in a hospital or clinic setting. Talk to your pediatrician regarding the use of this medicine in children. This medicine is not approved for use in children. Overdosage: If you think you have taken too much of this medicine contact a poison control center or emergency room at once. NOTE: This medicine is only for you. Do not share this medicine with others. What if I miss a dose? It is important not to miss your dose. Call your doctor or health care professional if you are unable to keep an appointment. What may interact with this medicine? -certain antibiotics given by injection -NSAIDs, medicines for pain and inflammation, like ibuprofen or naproxen -some diuretics like bumetanide, furosemide -teriparatide This list may not describe all possible interactions. Give your health care provider a list of all the medicines, herbs, non-prescription drugs, or dietary supplements you use. Also tell them if you smoke, drink alcohol, or use illegal drugs. Some items may interact with your medicine. What should I watch for while using this medicine? Visit your doctor or health care professional for regular checkups. It may be some time before you see the benefit from this medicine. Do not stop taking your medicine unless your doctor tells you to. Your doctor may order blood tests or other tests to see how you are doing. Women should inform their doctor if they wish to become pregnant or think they might be pregnant. There  is a potential for serious side effects to an unborn child. Talk to your health care professional or pharmacist for more information. You should make sure that you get enough calcium and vitamin D while you are taking this medicine. Discuss  the foods you eat and the vitamins you take with your health care professional. Some people who take this medicine have severe bone, joint, and/or muscle pain. This medicine may also increase your risk for jaw problems or a broken thigh bone. Tell your doctor right away if you have severe pain in your jaw, bones, joints, or muscles. Tell your doctor if you have any pain that does not go away or that gets worse. Tell your dentist and dental surgeon that you are taking this medicine. You should not have major dental surgery while on this medicine. See your dentist to have a dental exam and fix any dental problems before starting this medicine. Take good care of your teeth while on this medicine. Make sure you see your dentist for regular follow-up appointments. What side effects may I notice from receiving this medicine? Side effects that you should report to your doctor or health care professional as soon as possible: -allergic reactions like skin rash, itching or hives, swelling of the face, lips, or tongue -anxiety, confusion, or depression -breathing problems -changes in vision -eye pain -feeling faint or lightheaded, falls -jaw pain, especially after dental work -mouth sores -muscle cramps, stiffness, or weakness -redness, blistering, peeling or loosening of the skin, including inside the mouth -trouble passing urine or change in the amount of urine Side effects that usually do not require medical attention (report to your doctor or health care professional if they continue or are bothersome): -bone, joint, or muscle pain -constipation -diarrhea -fever -hair loss -irritation at site where injected -loss of appetite -nausea, vomiting -stomach upset -trouble sleeping -trouble swallowing -weak or tired This list may not describe all possible side effects. Call your doctor for medical advice about side effects. You may report side effects to FDA at 1-800-FDA-1088. Where should I keep my  medicine? This drug is given in a hospital or clinic and will not be stored at home. NOTE: This sheet is a summary. It may not cover all possible information. If you have questions about this medicine, talk to your doctor, pharmacist, or health care provider.    2016, Elsevier/Gold Standard. (2014-02-07 14:19:57)

## 2016-01-18 NOTE — Progress Notes (Signed)
Oral Chemotherapy Follow-Up Form  Original Start date of oral chemotherapy: 11/15/15   Called patient today to follow up regarding patient's oral chemotherapy medication: Revlimid   Pt reports 0 tablets/doses missed in the last week/month.   I spoke with Paula Johnson today in the infusion room.  She is here to start her next cycle of RVD.  She has received her Revlimid in mail and has had not problems or side effects to report on this regimen.  I encouraged Paula Johnson to call if she has any issues.  Will follow up and call patient again in 1 month   Thank you,  Carolynne Edouard, PharmD Oral Chemotherapy Clinic

## 2016-01-21 ENCOUNTER — Other Ambulatory Visit: Payer: Self-pay

## 2016-01-21 ENCOUNTER — Ambulatory Visit (HOSPITAL_BASED_OUTPATIENT_CLINIC_OR_DEPARTMENT_OTHER): Payer: BLUE CROSS/BLUE SHIELD

## 2016-01-21 VITALS — BP 121/78 | HR 96 | Temp 98.3°F | Resp 18

## 2016-01-21 DIAGNOSIS — C9000 Multiple myeloma not having achieved remission: Secondary | ICD-10-CM

## 2016-01-21 DIAGNOSIS — Z5112 Encounter for antineoplastic immunotherapy: Secondary | ICD-10-CM | POA: Diagnosis not present

## 2016-01-21 MED ORDER — BORTEZOMIB CHEMO SQ INJECTION 3.5 MG (2.5MG/ML)
1.3000 mg/m2 | Freq: Once | INTRAMUSCULAR | Status: AC
  Administered 2016-01-21: 2.25 mg via SUBCUTANEOUS
  Filled 2016-01-21: qty 2.25

## 2016-01-21 MED ORDER — PROCHLORPERAZINE MALEATE 10 MG PO TABS
10.0000 mg | ORAL_TABLET | Freq: Once | ORAL | Status: AC
  Administered 2016-01-21: 10 mg via ORAL

## 2016-01-21 MED ORDER — PROCHLORPERAZINE MALEATE 10 MG PO TABS
ORAL_TABLET | ORAL | Status: AC
  Filled 2016-01-21: qty 1

## 2016-01-21 NOTE — Patient Instructions (Signed)
[facility] Discharge Instructions for Patients Receiving Chemotherapy  Today you received the following chemotherapy agents: Velcade.  To help prevent nausea and vomiting after your treatment, we encourage you to take your nausea medication: Compazine 10 mg every 6 hours as needed; Zofran 8 mg every 12 hours as needed.   If you develop nausea and vomiting that is not controlled by your nausea medication, call the clinic.   BELOW ARE SYMPTOMS THAT SHOULD BE REPORTED IMMEDIATELY:  *FEVER GREATER THAN 100.5 F  *CHILLS WITH OR WITHOUT FEVER  NAUSEA AND VOMITING THAT IS NOT CONTROLLED WITH YOUR NAUSEA MEDICATION  *UNUSUAL SHORTNESS OF BREATH  *UNUSUAL BRUISING OR BLEEDING  TENDERNESS IN MOUTH AND THROAT WITH OR WITHOUT PRESENCE OF ULCERS  *URINARY PROBLEMS  *BOWEL PROBLEMS  UNUSUAL RASH Items with * indicate a potential emergency and should be followed up as soon as possible.  Feel free to call the clinic you have any questions or concerns. The clinic phone number is [phone].  Please show the [place] at check-in to the Emergency Department and triage nurse.    Zoledronic Acid injection (Paget's Disease, Osteoporosis) What is this medicine? ZOLEDRONIC ACID (ZOE le dron ik AS id) lowers the amount of calcium loss from bone. It is used to treat Paget's disease and osteoporosis in women. This medicine may be used for other purposes; ask your health care provider or pharmacist if you have questions. What should I tell my health care provider before I take this medicine? They need to know if you have any of these conditions: -aspirin-sensitive asthma -cancer, especially if you are receiving medicines used to treat cancer -dental disease or wear dentures -infection -kidney disease -low levels of calcium in the blood -past surgery on the parathyroid gland or intestines -receiving corticosteroids like dexamethasone or prednisone -an unusual or  allergic reaction to zoledronic acid, other medicines, foods, dyes, or preservatives -pregnant or trying to get pregnant -breast-feeding How should I use this medicine? This medicine is for infusion into a vein. It is given by a health care professional in a hospital or clinic setting. Talk to your pediatrician regarding the use of this medicine in children. This medicine is not approved for use in children. Overdosage: If you think you have taken too much of this medicine contact a poison control center or emergency room at once. NOTE: This medicine is only for you. Do not share this medicine with others. What if I miss a dose? It is important not to miss your dose. Call your doctor or health care professional if you are unable to keep an appointment. What may interact with this medicine? -certain antibiotics given by injection -NSAIDs, medicines for pain and inflammation, like ibuprofen or naproxen -some diuretics like bumetanide, furosemide -teriparatide This list may not describe all possible interactions. Give your health care provider a list of all the medicines, herbs, non-prescription drugs, or dietary supplements you use. Also tell them if you smoke, drink alcohol, or use illegal drugs. Some items may interact with your medicine. What should I watch for while using this medicine? Visit your doctor or health care professional for regular checkups. It may be some time before you see the benefit from this medicine. Do not stop taking your medicine unless your doctor tells you to. Your doctor may order blood tests or other tests to see how you are doing. Women should inform their doctor if they wish to become pregnant or think they might be pregnant. There  is a potential for serious side effects to an unborn child. Talk to your health care professional or pharmacist for more information. You should make sure that you get enough calcium and vitamin D while you are taking this medicine. Discuss  the foods you eat and the vitamins you take with your health care professional. Some people who take this medicine have severe bone, joint, and/or muscle pain. This medicine may also increase your risk for jaw problems or a broken thigh bone. Tell your doctor right away if you have severe pain in your jaw, bones, joints, or muscles. Tell your doctor if you have any pain that does not go away or that gets worse. Tell your dentist and dental surgeon that you are taking this medicine. You should not have major dental surgery while on this medicine. See your dentist to have a dental exam and fix any dental problems before starting this medicine. Take good care of your teeth while on this medicine. Make sure you see your dentist for regular follow-up appointments. What side effects may I notice from receiving this medicine? Side effects that you should report to your doctor or health care professional as soon as possible: -allergic reactions like skin rash, itching or hives, swelling of the face, lips, or tongue -anxiety, confusion, or depression -breathing problems -changes in vision -eye pain -feeling faint or lightheaded, falls -jaw pain, especially after dental work -mouth sores -muscle cramps, stiffness, or weakness -redness, blistering, peeling or loosening of the skin, including inside the mouth -trouble passing urine or change in the amount of urine Side effects that usually do not require medical attention (report to your doctor or health care professional if they continue or are bothersome): -bone, joint, or muscle pain -constipation -diarrhea -fever -hair loss -irritation at site where injected -loss of appetite -nausea, vomiting -stomach upset -trouble sleeping -trouble swallowing -weak or tired This list may not describe all possible side effects. Call your doctor for medical advice about side effects. You may report side effects to FDA at 1-800-FDA-1088. Where should I keep my  medicine? This drug is given in a hospital or clinic and will not be stored at home. NOTE: This sheet is a summary. It may not cover all possible information. If you have questions about this medicine, talk to your doctor, pharmacist, or health care provider.    2016, Elsevier/Gold Standard. (2014-02-07 14:19:57)

## 2016-01-25 ENCOUNTER — Other Ambulatory Visit: Payer: Self-pay | Admitting: *Deleted

## 2016-01-25 ENCOUNTER — Ambulatory Visit: Payer: Self-pay

## 2016-01-25 ENCOUNTER — Other Ambulatory Visit (HOSPITAL_BASED_OUTPATIENT_CLINIC_OR_DEPARTMENT_OTHER): Payer: BLUE CROSS/BLUE SHIELD

## 2016-01-25 ENCOUNTER — Other Ambulatory Visit: Payer: Self-pay

## 2016-01-25 ENCOUNTER — Other Ambulatory Visit: Payer: Self-pay | Admitting: Hematology

## 2016-01-25 ENCOUNTER — Other Ambulatory Visit: Payer: Self-pay | Admitting: Radiology

## 2016-01-25 ENCOUNTER — Ambulatory Visit (HOSPITAL_BASED_OUTPATIENT_CLINIC_OR_DEPARTMENT_OTHER): Payer: BLUE CROSS/BLUE SHIELD

## 2016-01-25 VITALS — BP 122/63 | HR 94 | Temp 97.8°F | Resp 17

## 2016-01-25 DIAGNOSIS — C9000 Multiple myeloma not having achieved remission: Secondary | ICD-10-CM | POA: Diagnosis not present

## 2016-01-25 DIAGNOSIS — Z5112 Encounter for antineoplastic immunotherapy: Secondary | ICD-10-CM

## 2016-01-25 LAB — COMPREHENSIVE METABOLIC PANEL
ALBUMIN: 4.3 g/dL (ref 3.5–5.0)
ALK PHOS: 82 U/L (ref 40–150)
ALT: 20 U/L (ref 0–55)
AST: 14 U/L (ref 5–34)
Anion Gap: 9 mEq/L (ref 3–11)
BILIRUBIN TOTAL: 0.75 mg/dL (ref 0.20–1.20)
BUN: 16.7 mg/dL (ref 7.0–26.0)
CO2: 25 mEq/L (ref 22–29)
Calcium: 9.4 mg/dL (ref 8.4–10.4)
Chloride: 106 mEq/L (ref 98–109)
Creatinine: 0.8 mg/dL (ref 0.6–1.1)
EGFR: 83 mL/min/{1.73_m2} — ABNORMAL LOW (ref >90)
GLUCOSE: 140 mg/dL (ref 70–140)
Potassium: 4.1 mEq/L (ref 3.5–5.1)
SODIUM: 141 meq/L (ref 136–145)
TOTAL PROTEIN: 7.4 g/dL (ref 6.4–8.3)

## 2016-01-25 LAB — CBC & DIFF AND RETIC
BASO%: 0.3 % (ref 0.0–2.0)
BASOS ABS: 0 10*3/uL (ref 0.0–0.1)
EOS%: 0 % (ref 0.0–7.0)
Eosinophils Absolute: 0 10*3/uL (ref 0.0–0.5)
HCT: 37.6 % (ref 34.8–46.6)
HEMOGLOBIN: 12 g/dL (ref 11.6–15.9)
LYMPH#: 0.4 10*3/uL — AB (ref 0.9–3.3)
LYMPH%: 9.1 % — ABNORMAL LOW (ref 14.0–49.7)
MCH: 30 pg (ref 25.1–34.0)
MCHC: 31.8 g/dL (ref 31.5–36.0)
MCV: 94.2 fL (ref 79.5–101.0)
MONO#: 0.2 10*3/uL (ref 0.1–0.9)
MONO%: 3.6 % (ref 0.0–14.0)
NEUT#: 4 10*3/uL (ref 1.5–6.5)
NEUT%: 87 % — ABNORMAL HIGH (ref 38.4–76.8)
Platelets: 301 10*3/uL (ref 145–400)
RBC: 3.99 10*6/uL (ref 3.70–5.45)
RDW: 17.2 % — AB (ref 11.2–14.5)
WBC: 4.6 10*3/uL (ref 3.9–10.3)

## 2016-01-25 MED ORDER — LENALIDOMIDE 25 MG PO CAPS: ORAL_CAPSULE | ORAL | Status: DC

## 2016-01-25 MED ORDER — PROCHLORPERAZINE MALEATE 10 MG PO TABS
10.0000 mg | ORAL_TABLET | Freq: Once | ORAL | Status: AC
  Administered 2016-01-25: 10 mg via ORAL

## 2016-01-25 MED ORDER — PROCHLORPERAZINE MALEATE 10 MG PO TABS
ORAL_TABLET | ORAL | Status: AC
  Filled 2016-01-25: qty 1

## 2016-01-25 MED ORDER — BORTEZOMIB CHEMO SQ INJECTION 3.5 MG (2.5MG/ML)
1.3000 mg/m2 | Freq: Once | INTRAMUSCULAR | Status: AC
  Administered 2016-01-25: 2.25 mg via SUBCUTANEOUS
  Filled 2016-01-25: qty 2.25

## 2016-01-25 NOTE — Patient Instructions (Signed)
[facility] Discharge Instructions for Patients Receiving Chemotherapy  Today you received the following chemotherapy agents Velcade. To help prevent nausea and vomiting after your treatment, we encourage you to take your nausea medication as directed.  If you develop nausea and vomiting that is not controlled by your nausea medication, call the clinic.   BELOW ARE SYMPTOMS THAT SHOULD BE REPORTED IMMEDIATELY:  *FEVER GREATER THAN 100.5 F  *CHILLS WITH OR WITHOUT FEVER  NAUSEA AND VOMITING THAT IS NOT CONTROLLED WITH YOUR NAUSEA MEDICATION  *UNUSUAL SHORTNESS OF BREATH  *UNUSUAL BRUISING OR BLEEDING  TENDERNESS IN MOUTH AND THROAT WITH OR WITHOUT PRESENCE OF ULCERS  *URINARY PROBLEMS  *BOWEL PROBLEMS  UNUSUAL RASH Items with * indicate a potential emergency and should be followed up as soon as possible.  Feel free to call the clinic you have any questions or concerns. The clinic phone number is [phone].  Please show the CHEMO ALERT CARD at check-in to the Emergency Department and triage nurse.    

## 2016-01-26 ENCOUNTER — Other Ambulatory Visit: Payer: Self-pay | Admitting: Radiology

## 2016-01-26 LAB — RETICULOCYTES: Reticulocyte Count: 1.5 % (ref 0.6–2.6)

## 2016-01-27 ENCOUNTER — Telehealth: Payer: Self-pay | Admitting: *Deleted

## 2016-01-27 NOTE — Telephone Encounter (Signed)
"  I called a week ago and have not received a return call.  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  Having problems with employment.  After I have transplant I am going to have to find a new job.  Please call me at [phone]."

## 2016-01-28 ENCOUNTER — Ambulatory Visit (HOSPITAL_COMMUNITY)
Admission: RE | Admit: 2016-01-28 | Discharge: 2016-01-28 | Disposition: A | Discharge status: Home / Self Care | Payer: BLUE CROSS/BLUE SHIELD | Source: Ambulatory Visit | Attending: Hematology | Admitting: Hematology

## 2016-01-28 ENCOUNTER — Other Ambulatory Visit: Payer: Self-pay

## 2016-01-28 ENCOUNTER — Encounter (HOSPITAL_COMMUNITY): Payer: Self-pay

## 2016-01-28 ENCOUNTER — Ambulatory Visit (HOSPITAL_BASED_OUTPATIENT_CLINIC_OR_DEPARTMENT_OTHER): Payer: BLUE CROSS/BLUE SHIELD

## 2016-01-28 VITALS — BP 125/71 | HR 93 | Temp 98.4°F | Resp 16

## 2016-01-28 DIAGNOSIS — C9000 Multiple myeloma not having achieved remission: Secondary | ICD-10-CM

## 2016-01-28 DIAGNOSIS — Z5112 Encounter for antineoplastic immunotherapy: Secondary | ICD-10-CM | POA: Diagnosis not present

## 2016-01-28 DIAGNOSIS — D649 Anemia, unspecified: Secondary | ICD-10-CM | POA: Insufficient documentation

## 2016-01-28 DIAGNOSIS — Z79899 Other long term (current) drug therapy: Secondary | ICD-10-CM | POA: Insufficient documentation

## 2016-01-28 DIAGNOSIS — D72819 Decreased white blood cell count, unspecified: Secondary | ICD-10-CM | POA: Diagnosis not present

## 2016-01-28 DIAGNOSIS — Z7982 Long term (current) use of aspirin: Secondary | ICD-10-CM | POA: Insufficient documentation

## 2016-01-28 LAB — CBC WITH DIFFERENTIAL/PLATELET
BASOS PCT: 1 %
Basophils Absolute: 0 10*3/uL (ref 0.0–0.1)
Eosinophils Absolute: 0.2 10*3/uL (ref 0.0–0.7)
Eosinophils Relative: 8 %
HCT: 36 % (ref 36.0–46.0)
HEMOGLOBIN: 11.6 g/dL — AB (ref 12.0–15.0)
LYMPHS ABS: 0.7 10*3/uL (ref 0.7–4.0)
Lymphocytes Relative: 31 %
MCH: 30.6 pg (ref 26.0–34.0)
MCHC: 32.2 g/dL (ref 30.0–36.0)
MCV: 95 fL (ref 78.0–100.0)
MONOS PCT: 15 %
Monocytes Absolute: 0.3 10*3/uL (ref 0.1–1.0)
NEUTROS ABS: 1.1 10*3/uL — AB (ref 1.7–7.7)
NEUTROS PCT: 45 %
Platelets: 206 10*3/uL (ref 150–400)
RBC: 3.79 MIL/uL — ABNORMAL LOW (ref 3.87–5.11)
RDW: 16.4 % — ABNORMAL HIGH (ref 11.5–15.5)
WBC: 2.3 10*3/uL — ABNORMAL LOW (ref 4.0–10.5)

## 2016-01-28 LAB — BONE MARROW EXAM

## 2016-01-28 LAB — PROTIME-INR
INR: 1 (ref 0.00–1.49)
Prothrombin Time: 13 seconds (ref 11.6–15.2)

## 2016-01-28 MED ORDER — MIDAZOLAM HCL 2 MG/2ML IJ SOLN
INTRAMUSCULAR | Status: AC
  Filled 2016-01-28: qty 6

## 2016-01-28 MED ORDER — BORTEZOMIB CHEMO SQ INJECTION 3.5 MG (2.5MG/ML)
1.3000 mg/m2 | Freq: Once | INTRAMUSCULAR | Status: AC
  Administered 2016-01-28: 2.25 mg via SUBCUTANEOUS
  Filled 2016-01-28: qty 2.25

## 2016-01-28 MED ORDER — FENTANYL CITRATE (PF) 100 MCG/2ML IJ SOLN
INTRAMUSCULAR | Status: AC
  Filled 2016-01-28: qty 4

## 2016-01-28 MED ORDER — FENTANYL CITRATE (PF) 100 MCG/2ML IJ SOLN
INTRAMUSCULAR | Status: AC | PRN
  Administered 2016-01-28: 50 ug via INTRAVENOUS

## 2016-01-28 MED ORDER — MIDAZOLAM HCL 2 MG/2ML IJ SOLN
INTRAMUSCULAR | Status: AC | PRN
  Administered 2016-01-28 (×3): 1 mg via INTRAVENOUS

## 2016-01-28 MED ORDER — PROCHLORPERAZINE MALEATE 10 MG PO TABS
ORAL_TABLET | ORAL | Status: AC
  Filled 2016-01-28: qty 1

## 2016-01-28 MED ORDER — PROCHLORPERAZINE MALEATE 10 MG PO TABS
10.0000 mg | ORAL_TABLET | Freq: Once | ORAL | Status: AC
  Administered 2016-01-28: 10 mg via ORAL

## 2016-01-28 MED ORDER — SODIUM CHLORIDE 0.9 % IV SOLN: INTRAVENOUS | Status: DC

## 2016-01-28 NOTE — Patient Instructions (Signed)
[facility] Discharge Instructions for Patients Receiving Chemotherapy  Today you received the following chemotherapy agents: Velcade.  To help prevent nausea and vomiting after your treatment, we encourage you to take your nausea medication: Compazine 10 mg every 6 hours as needed.   If you develop nausea and vomiting that is not controlled by your nausea medication, call the clinic.   BELOW ARE SYMPTOMS THAT SHOULD BE REPORTED IMMEDIATELY:  *FEVER GREATER THAN 100.5 F  *CHILLS WITH OR WITHOUT FEVER  NAUSEA AND VOMITING THAT IS NOT CONTROLLED WITH YOUR NAUSEA MEDICATION  *UNUSUAL SHORTNESS OF BREATH  *UNUSUAL BRUISING OR BLEEDING  TENDERNESS IN MOUTH AND THROAT WITH OR WITHOUT PRESENCE OF ULCERS  *URINARY PROBLEMS  *BOWEL PROBLEMS  UNUSUAL RASH Items with * indicate a potential emergency and should be followed up as soon as possible.  Feel free to call the clinic you have any questions or concerns. The clinic phone number is [phone].  Please show the CHEMO ALERT CARD at check-in to the Emergency Department and triage nurse.   

## 2016-01-28 NOTE — Progress Notes (Signed)
OK to treat with ANC of 1.1 per Darden Dates, RN per [name].

## 2016-01-28 NOTE — Discharge Instructions (Signed)
Bone Marrow Aspiration and Bone Marrow Biopsy °Bone marrow aspiration and bone marrow biopsy are procedures that are done to diagnose blood disorders. You may also have one of these procedures to help diagnose infections or some types of cancer. °Bone marrow is the soft tissue that is inside your bones. Blood cells are produced in bone marrow. For bone marrow aspiration, a sample of tissue in liquid form is removed from inside your bone. For a bone marrow biopsy, a small core of bone marrow tissue is removed. Then these samples are examined under a microscope or tested in a lab. °You may need these procedures if you have an abnormal complete blood count (CBC). The aspiration or biopsy sample is usually taken from the top of your hip bone. Sometimes, an aspiration sample is taken from your chest bone (sternum). °LET YOUR HEALTH CARE PROVIDER KNOW ABOUT: °· Any allergies you have. °· All medicines you are taking, including vitamins, herbs, eye drops, creams, and over-the-counter medicines. °· Previous problems you or members of your family have had with the use of anesthetics. °· Any blood disorders you have. °· Previous surgeries you have had. °· Any medical conditions you may have. °· Whether you are pregnant or you think that you may be pregnant. °RISKS AND COMPLICATIONS °Generally, this is a safe procedure. However, problems may occur, including: °· Infection. °· Bleeding. °BEFORE THE PROCEDURE °· Ask your health care provider about: °¨ Changing or stopping your regular medicines. This is especially important if you are taking diabetes medicines or blood thinners. °¨ Taking medicines such as aspirin and ibuprofen. These medicines can thin your blood. Do not take these medicines before your procedure if your health care provider instructs you not to. °· Plan to have someone take you home after the procedure. °· If you go home right after the procedure, plan to have someone with you for 24 hours. °PROCEDURE  °· An  IV tube may be inserted into one of your veins. °· The injection site will be cleaned with a germ-killing solution (antiseptic). °· You will be given one or more of the following: °¨ A medicine that helps you relax (sedative). °¨ A medicine that numbs the area (local anesthetic). °· The bone marrow sample will be removed as follows: °¨ For an aspiration, a hollow needle will be inserted through your skin and into your bone. Bone marrow fluid will be drawn up into a syringe. °¨ For a biopsy, your health care provider will use a hollow needle to remove a core of tissue from your bone marrow. °· The needle will be removed. °· A bandage (dressing) will be placed over the insertion site and taped in place. °The procedure may vary among health care providers and hospitals. °AFTER THE PROCEDURE °· Your blood pressure, heart rate, breathing rate, and blood oxygen level will be monitored often until the medicines you were given have worn off. °· Return to your normal activities as directed by your health care provider. °  °This information is not intended to replace advice given to you by your health care provider. Make sure you discuss any questions you have with your health care provider. °  °Document Released: 09/14/2004 Document Revised: 01/26/2015 Document Reviewed: 09/02/2014 °Elsevier Interactive Patient Education ©2016 Elsevier Inc. °Bone Marrow Aspiration and Bone Marrow Biopsy, Care After °Refer to this sheet in the next few weeks. These instructions provide you with information about caring for yourself after your procedure. Your health care provider may also give you   more specific instructions. Your treatment has been planned according to current medical practices, but problems sometimes occur. Call your health care provider if you have any problems or questions after your procedure. WHAT TO EXPECT AFTER THE PROCEDURE After your procedure, it is common to have:  Soreness or tenderness around the puncture  site.  Bruising. HOME CARE INSTRUCTIONS  Take medicines only as directed by your health care provider.  Follow your health care provider's instructions about:  Puncture site care.  Bandage (dressing) changes and removal.  Bathe and shower as directed by your health care provider.  Check your puncture site every day for signs of infection. Watch for:  Redness, swelling, or pain.  Fluid, blood, or pus.  Return to your normal activities as directed by your health care provider.  Keep all follow-up visits as directed by your health care provider. This is important. SEEK MEDICAL CARE IF:  You have a fever.  You have uncontrollable bleeding.  You have redness, swelling, or pain at the site of your puncture.  You have fluid, blood, or pus coming from your puncture site.   This information is not intended to replace advice given to you by your health care provider. Make sure you discuss any questions you have with your health care provider.   Document Released: 03/31/2005 Document Revised: 01/26/2015 Document Reviewed: 09/02/2014 Elsevier Interactive Patient Education 2016 Elsevier Inc. Moderate Conscious Sedation, Adult Sedation is the use of medicines to promote relaxation and relieve discomfort and anxiety. Moderate conscious sedation is a type of sedation. Under moderate conscious sedation you are less alert than normal but are still able to respond to instructions or stimulation. Moderate conscious sedation is used during short medical and dental procedures. It is milder than deep sedation or general anesthesia and allows you to return to your regular activities sooner. LET [facility] CARE PROVIDER KNOW ABOUT:   Any allergies you have.  All medicines you are taking, including vitamins, herbs, eye drops, creams, and over-the-counter medicines.  Use of steroids (by mouth or creams).  Previous problems you or members of your family have had with the use of  anesthetics.  Any blood disorders you have.  Previous surgeries you have had.  Medical conditions you have.  Possibility of pregnancy, if this applies.  Use of cigarettes, alcohol, or illegal drugs. RISKS AND COMPLICATIONS Generally, this is a safe procedure. However, as with any procedure, problems can occur. Possible problems include:  Oversedation.  Trouble breathing on your own. You may need to have a breathing tube until you are awake and breathing on your own.  Allergic reaction to any of the medicines used for the procedure. BEFORE THE PROCEDURE  You may have blood tests done. These tests can help show how well your kidneys and liver are working. They can also show how well your blood clots.  A physical exam will be done.  Only take medicines as directed by your health care provider. You may need to stop taking medicines (such as blood thinners, aspirin, or nonsteroidal anti-inflammatory drugs) before the procedure.   Do not eat or drink at least 6 hours before the procedure or as directed by your health care provider.  Arrange for a responsible adult, family member, or friend to take you home after the procedure. He or she should stay with you for at least 24 hours after the procedure, until the medicine has worn off. PROCEDURE   An intravenous (IV) catheter will be inserted into one of your  veins. Medicine will be able to flow directly into your body through this catheter. You may be given medicine through this tube to help prevent pain and help you relax.  The medical or dental procedure will be done. AFTER THE PROCEDURE  You will stay in a recovery area until the medicine has worn off. Your blood pressure and pulse will be checked.   Depending on the procedure you had, you may be allowed to go home when you can tolerate liquids and your pain is under control.   This information is not intended to replace advice given to you by your health care provider. Make  sure you discuss any questions you have with your health care provider.   Document Released: 06/06/2001 Document Revised: 10/02/2014 Document Reviewed: 05/19/2013 Elsevier Interactive Patient Education [employer].

## 2016-01-28 NOTE — Procedures (Signed)
Successful RT ILIAC BM ASP AND CORE BX NO COMP STABLE PATH PENDING FULL REPORT IN PACS  

## 2016-01-28 NOTE — H&P (Signed)
Referring Physician(s): Brunetta Genera  Supervising Physician: Daryll Brod  Patient Status: OP  Chief Complaint:  "I'm here for another bone marrow biopsy"  Subjective: Patient familiar to IR service from prior bone marrow biopsy in January 2017. She has a history of multiple myeloma and is currently receiving chemotherapy. She presents again today for repeat CT-guided bone marrow biopsy prior to anticipated stem cell transplant. She currently denies fever, headache, chest pain, dyspnea, cough, abdominal/back pain, nausea/vomiting or abnormal bleeding. She does have some mild fatigue.   Allergies: Ampicillin  Medications: Prior to Admission medications   Medication Sig Start Date End Date Taking? Authorizing Provider  acyclovir (ZOVIRAX) 400 MG tablet Take 1 tablet (400 mg total) by mouth 2 (two) times daily. 01/13/16  Yes Brunetta Genera, MD  cetirizine (ZYRTEC) 10 MG tablet Take 10 mg by mouth daily.   Yes Historical Provider, MD  famotidine (PEPCID) 10 MG tablet Take 10 mg by mouth 2 (two) times daily.   Yes Historical Provider, MD  lenalidomide (REVLIMID) 25 MG capsule Take 1 capsule on days 1-14. Repeat every 21 days.  Authorization # [id number]. 01/25/16  Yes Brunetta Genera, MD  aspirin EC 325 MG tablet Take 1 tablet (325 mg total) by mouth daily. 10/20/15   Brunetta Genera, MD  cholecalciferol (VITAMIN D) 1000 units tablet Take 2,000 Units by mouth daily.    Historical Provider, MD  dexamethasone (DECADRON) 4 MG tablet Take 10 tablets (40 mg) on days 1, 8, and 15 of chemo. Repeat every 21 days. 01/06/16   Brunetta Genera, MD  LORazepam (ATIVAN) 0.5 MG tablet Take 1 tablet (0.5 mg total) by mouth every 6 (six) hours as needed (Nausea or vomiting). 10/20/15   Brunetta Genera, MD  ondansetron (ZOFRAN) 8 MG tablet Take 1 tablet (8 mg total) by mouth 2 (two) times daily as needed (Nausea or vomiting). 10/19/15   Brunetta Genera, MD  prochlorperazine  (COMPAZINE) 10 MG tablet Take 1 tablet (10 mg total) by mouth every 6 (six) hours as needed (Nausea or vomiting). 10/19/15   Brunetta Genera, MD  senna-docusate (SENNA S) 8.6-50 MG tablet Take 2 tablets by mouth at bedtime. 01/06/16   Brunetta Genera, MD     Vital Signs:Blood pressure 113/68, heart rate 76, temperature 98.5, respirations 16, O2 sat 100% room air   Physical Exam  Constitutional: She is oriented to person, place, and time. She appears well-developed and well-nourished.  Cardiovascular: Normal rate and regular rhythm.   Pulmonary/Chest: Effort normal and breath sounds normal.  Abdominal: Soft. Bowel sounds are normal. There is no tenderness.  Musculoskeletal: Normal range of motion. She exhibits no edema.  Neurological: She is alert and oriented to person, place, and time.    Imaging: No results found.  Labs:  CBC:  Recent Labs  01/13/16 1610 01/18/16 1543 01/25/16 1620 01/28/16 0720  WBC 4.2 3.8* 4.6 2.3*  HGB 10.2* 10.7* 12.0 11.6*  HCT 31.7* 32.8* 37.6 36.0  PLT 206 403* 301 206    COAGS:  Recent Labs  10/13/15 0715 01/28/16 0720  INR 1.13 1.00  APTT 30  --     BMP:  Recent Labs  11/20/15 1217  12/28/15 1525 01/03/16 1105 01/18/16 1543 01/25/16 1621  NA 141  < > 141 140 141 141  K 4.1  < > 4.2 3.8 4.2 4.1  CL 104  --   --   --   --   --  CO2 26  < > 24 26 22 25   GLUCOSE 82  < > 128 104 152* 140  BUN 21*  < > 18.5 17.3 19.6 16.7  CALCIUM 9.0  < > 9.2 8.5 9.6 9.4  CREATININE 0.79  < > 0.8 0.8 0.8 0.8  GFRNONAA >60  --   --   --   --   --   GFRAA >60  --   --   --   --   --   < > = values in this interval not displayed.  LIVER FUNCTION TESTS:  Recent Labs  12/28/15 1525 01/03/16 1105 01/13/16 1610 01/18/16 1543 01/25/16 1621  BILITOT 0.65 0.84  --  0.64 0.75  AST 19 21  --  17 14  ALT 28 28  --  28 20  ALKPHOS 107 93  --  84 82  PROT 7.7 6.9 6.1 7.0 7.4  ALBUMIN 4.0 3.8  --  4.0 4.3    Assessment and Plan: Pt  with history of multiple myeloma and currently receiving chemotherapy. Prior bone marrow biopsy performed in January 2017. She presents again today for repeat CT-guided bone marrow biopsy prior to anticipated stem cell transplant. Risks and benefits discussed with the patient including, but not limited to bleeding, infection, damage to adjacent structures or low yield requiring additional tests.All of the patient's questions were answered, patient is agreeable to proceed.Consent signed and in chart.     Electronically Signed: D. Rowe Robert 01/28/2016, 8:12 AM   I spent a total of 20 minutes at the the patient's bedside AND on the patient's hospital floor or unit, greater than 50% of which was counseling/coordinating care for CT guided bone marrow biopsy

## 2016-02-01 ENCOUNTER — Other Ambulatory Visit (HOSPITAL_BASED_OUTPATIENT_CLINIC_OR_DEPARTMENT_OTHER): Payer: BLUE CROSS/BLUE SHIELD

## 2016-02-01 ENCOUNTER — Encounter: Payer: Self-pay | Admitting: Hematology

## 2016-02-01 ENCOUNTER — Other Ambulatory Visit: Payer: Self-pay

## 2016-02-01 ENCOUNTER — Ambulatory Visit (HOSPITAL_BASED_OUTPATIENT_CLINIC_OR_DEPARTMENT_OTHER): Payer: BLUE CROSS/BLUE SHIELD | Admitting: Hematology

## 2016-02-01 ENCOUNTER — Ambulatory Visit: Payer: Self-pay

## 2016-02-01 ENCOUNTER — Ambulatory Visit: Payer: Self-pay | Admitting: Hematology

## 2016-02-01 VITALS — BP 131/72 | HR 95 | Temp 98.0°F | Resp 18 | Ht 68.5 in | Wt 134.4 lb

## 2016-02-01 DIAGNOSIS — D630 Anemia in neoplastic disease: Secondary | ICD-10-CM | POA: Diagnosis not present

## 2016-02-01 DIAGNOSIS — D6481 Anemia due to antineoplastic chemotherapy: Secondary | ICD-10-CM

## 2016-02-01 DIAGNOSIS — C9000 Multiple myeloma not having achieved remission: Secondary | ICD-10-CM

## 2016-02-01 DIAGNOSIS — R5383 Other fatigue: Secondary | ICD-10-CM

## 2016-02-01 LAB — COMPREHENSIVE METABOLIC PANEL
ALBUMIN: 4.3 g/dL (ref 3.5–5.0)
ALK PHOS: 79 U/L (ref 40–150)
ALT: 25 U/L (ref 0–55)
ANION GAP: 9 meq/L (ref 3–11)
AST: 25 U/L (ref 5–34)
BILIRUBIN TOTAL: 0.75 mg/dL (ref 0.20–1.20)
BUN: 19.7 mg/dL (ref 7.0–26.0)
CO2: 25 mEq/L (ref 22–29)
CREATININE: 0.8 mg/dL (ref 0.6–1.1)
Calcium: 9.4 mg/dL (ref 8.4–10.4)
Chloride: 107 mEq/L (ref 98–109)
EGFR: 84 mL/min/{1.73_m2} — ABNORMAL LOW (ref >90)
Glucose: 129 mg/dl (ref 70–140)
Potassium: 4.1 mEq/L (ref 3.5–5.1)
Sodium: 141 mEq/L (ref 136–145)
TOTAL PROTEIN: 7.4 g/dL (ref 6.4–8.3)

## 2016-02-01 LAB — CBC & DIFF AND RETIC
BASO%: 0 % (ref 0.0–2.0)
BASOS ABS: 0 10*3/uL (ref 0.0–0.1)
EOS ABS: 0 10*3/uL (ref 0.0–0.5)
EOS%: 0 % (ref 0.0–7.0)
HCT: 36.6 % (ref 34.8–46.6)
HGB: 11.9 g/dL (ref 11.6–15.9)
IMMATURE RETIC FRACT: 3.5 % (ref 1.60–10.00)
LYMPH#: 0.4 10*3/uL — AB (ref 0.9–3.3)
LYMPH%: 9.8 % — ABNORMAL LOW (ref 14.0–49.7)
MCH: 30.7 pg (ref 25.1–34.0)
MCHC: 32.5 g/dL (ref 31.5–36.0)
MCV: 94.3 fL (ref 79.5–101.0)
MONO#: 0.1 10*3/uL (ref 0.1–0.9)
MONO%: 2.8 % (ref 0.0–14.0)
NEUT%: 87.4 % — ABNORMAL HIGH (ref 38.4–76.8)
NEUTROS ABS: 3.1 10*3/uL (ref 1.5–6.5)
PLATELETS: 169 10*3/uL (ref 145–400)
RBC: 3.88 10*6/uL (ref 3.70–5.45)
RDW: 16.3 % — AB (ref 11.2–14.5)
RETIC %: 1.11 % (ref 0.70–2.10)
RETIC CT ABS: 43.07 10*3/uL (ref 33.70–90.70)
WBC: 3.6 10*3/uL — AB (ref 3.9–10.3)

## 2016-02-02 LAB — KAPPA/LAMBDA LIGHT CHAINS
IG KAPPA FREE LIGHT CHAIN: 13.04 mg/L (ref 3.30–19.40)
Ig Lambda Free Light Chain: 21.06 mg/L (ref 5.71–26.30)
Kappa/Lambda FluidC Ratio: 0.62 (ref 0.26–1.65)

## 2016-02-04 ENCOUNTER — Ambulatory Visit: Payer: Self-pay

## 2016-02-04 ENCOUNTER — Other Ambulatory Visit: Payer: Self-pay

## 2016-02-04 LAB — MULTIPLE MYELOMA PANEL, SERUM
ALBUMIN SERPL ELPH-MCNC: 4.2 g/dL (ref 2.9–4.4)
ALPHA 1: 0.3 g/dL (ref 0.0–0.4)
Albumin/Glob SerPl: 1.7 (ref 0.7–1.7)
Alpha2 Glob SerPl Elph-Mcnc: 0.6 g/dL (ref 0.4–1.0)
B-GLOBULIN SERPL ELPH-MCNC: 0.9 g/dL (ref 0.7–1.3)
GAMMA GLOB SERPL ELPH-MCNC: 0.7 g/dL (ref 0.4–1.8)
GLOBULIN, TOTAL: 2.5 g/dL (ref 2.2–3.9)
IGA/IMMUNOGLOBULIN A, SERUM: 262 mg/dL (ref 87–352)
IgG, Qn, Serum: 604 mg/dL — ABNORMAL LOW (ref 700–1600)
IgM, Qn, Serum: 23 mg/dL — ABNORMAL LOW (ref 26–217)
M Protein SerPl Elph-Mcnc: 0.1 g/dL — ABNORMAL HIGH
Total Protein: 6.7 g/dL (ref 6.0–8.5)

## 2016-02-07 ENCOUNTER — Other Ambulatory Visit: Payer: Self-pay | Admitting: Hematology

## 2016-02-07 ENCOUNTER — Telehealth: Payer: Self-pay | Admitting: Hematology

## 2016-02-07 ENCOUNTER — Other Ambulatory Visit: Payer: Self-pay | Admitting: *Deleted

## 2016-02-07 NOTE — Telephone Encounter (Signed)
vm for pt regarding to 5.16 appt

## 2016-02-08 ENCOUNTER — Telehealth: Payer: Self-pay | Admitting: *Deleted

## 2016-02-08 ENCOUNTER — Other Ambulatory Visit (HOSPITAL_BASED_OUTPATIENT_CLINIC_OR_DEPARTMENT_OTHER): Payer: BLUE CROSS/BLUE SHIELD

## 2016-02-08 ENCOUNTER — Ambulatory Visit (HOSPITAL_BASED_OUTPATIENT_CLINIC_OR_DEPARTMENT_OTHER): Payer: BLUE CROSS/BLUE SHIELD

## 2016-02-08 ENCOUNTER — Other Ambulatory Visit: Payer: Self-pay

## 2016-02-08 VITALS — BP 118/74 | HR 97 | Temp 98.1°F | Resp 18

## 2016-02-08 DIAGNOSIS — C9000 Multiple myeloma not having achieved remission: Secondary | ICD-10-CM | POA: Diagnosis not present

## 2016-02-08 DIAGNOSIS — Z5112 Encounter for antineoplastic immunotherapy: Secondary | ICD-10-CM

## 2016-02-08 LAB — CBC & DIFF AND RETIC
BASO%: 0.2 % (ref 0.0–2.0)
BASOS ABS: 0 10*3/uL (ref 0.0–0.1)
EOS ABS: 0 10*3/uL (ref 0.0–0.5)
EOS%: 0 % (ref 0.0–7.0)
HEMATOCRIT: 38.2 % (ref 34.8–46.6)
HEMOGLOBIN: 12.4 g/dL (ref 11.6–15.9)
Immature Retic Fract: 3.6 % (ref 1.60–10.00)
LYMPH%: 7.9 % — AB (ref 14.0–49.7)
MCH: 30.8 pg (ref 25.1–34.0)
MCHC: 32.5 g/dL (ref 31.5–36.0)
MCV: 94.8 fL (ref 79.5–101.0)
MONO#: 0.2 10*3/uL (ref 0.1–0.9)
MONO%: 3.5 % (ref 0.0–14.0)
NEUT%: 88.4 % — ABNORMAL HIGH (ref 38.4–76.8)
NEUTROS ABS: 4.1 10*3/uL (ref 1.5–6.5)
Platelets: 368 10*3/uL (ref 145–400)
RBC: 4.03 10*6/uL (ref 3.70–5.45)
RDW: 16 % — ABNORMAL HIGH (ref 11.2–14.5)
Retic %: 0.96 % (ref 0.70–2.10)
Retic Ct Abs: 38.69 10*3/uL (ref 33.70–90.70)
WBC: 4.6 10*3/uL (ref 3.9–10.3)
lymph#: 0.4 10*3/uL — ABNORMAL LOW (ref 0.9–3.3)

## 2016-02-08 LAB — COMPREHENSIVE METABOLIC PANEL
ALBUMIN: 4.3 g/dL (ref 3.5–5.0)
ALK PHOS: 84 U/L (ref 40–150)
ALT: 20 U/L (ref 0–55)
AST: 19 U/L (ref 5–34)
Anion Gap: 10 mEq/L (ref 3–11)
BUN: 20.4 mg/dL (ref 7.0–26.0)
CALCIUM: 10 mg/dL (ref 8.4–10.4)
CO2: 25 mEq/L (ref 22–29)
Chloride: 108 mEq/L (ref 98–109)
Creatinine: 0.8 mg/dL (ref 0.6–1.1)
EGFR: 82 mL/min/{1.73_m2} — AB (ref >90)
Glucose: 132 mg/dl (ref 70–140)
POTASSIUM: 4.2 meq/L (ref 3.5–5.1)
Sodium: 142 mEq/L (ref 136–145)
Total Bilirubin: 0.66 mg/dL (ref 0.20–1.20)
Total Protein: 7.8 g/dL (ref 6.4–8.3)

## 2016-02-08 LAB — CHROMOSOME ANALYSIS, BONE MARROW

## 2016-02-08 MED ORDER — PROCHLORPERAZINE MALEATE 10 MG PO TABS
10.0000 mg | ORAL_TABLET | Freq: Once | ORAL | Status: AC
Start: 2016-02-08 — End: 2016-02-08
  Administered 2016-02-08: 10 mg via ORAL

## 2016-02-08 MED ORDER — PROCHLORPERAZINE MALEATE 10 MG PO TABS
ORAL_TABLET | ORAL | Status: AC
  Filled 2016-02-08: qty 1

## 2016-02-08 MED ORDER — BORTEZOMIB CHEMO SQ INJECTION 3.5 MG (2.5MG/ML)
1.3000 mg/m2 | Freq: Once | INTRAMUSCULAR | Status: AC
  Administered 2016-02-08: 2.25 mg via SUBCUTANEOUS
  Filled 2016-02-08: qty 2.25

## 2016-02-08 NOTE — Telephone Encounter (Signed)
Fixed patient appts

## 2016-02-08 NOTE — Patient Instructions (Signed)
[facility] Discharge Instructions for Patients Receiving Chemotherapy  Today you received the following chemotherapy agents: Velcade.  To help prevent nausea and vomiting after your treatment, we encourage you to take your nausea medication: Compazine 10 mg every 6 hours as needed.   If you develop nausea and vomiting that is not controlled by your nausea medication, call the clinic.   BELOW ARE SYMPTOMS THAT SHOULD BE REPORTED IMMEDIATELY:  *FEVER GREATER THAN 100.5 F  *CHILLS WITH OR WITHOUT FEVER  NAUSEA AND VOMITING THAT IS NOT CONTROLLED WITH YOUR NAUSEA MEDICATION  *UNUSUAL SHORTNESS OF BREATH  *UNUSUAL BRUISING OR BLEEDING  TENDERNESS IN MOUTH AND THROAT WITH OR WITHOUT PRESENCE OF ULCERS  *URINARY PROBLEMS  *BOWEL PROBLEMS  UNUSUAL RASH Items with * indicate a potential emergency and should be followed up as soon as possible.  Feel free to call the clinic you have any questions or concerns. The clinic phone number is [phone].  Please show the CHEMO ALERT CARD at check-in to the Emergency Department and triage nurse.   

## 2016-02-09 ENCOUNTER — Ambulatory Visit: Payer: Self-pay

## 2016-02-10 ENCOUNTER — Ambulatory Visit: Payer: Self-pay

## 2016-02-10 NOTE — Progress Notes (Signed)
[facility]    HEMATOLOGY/ONCOLOGY CLINIC NOTE  Date of Service: .02/01/2016   Patient Care Team: [name], MD as PCP - General (Family Medicine)  CHIEF COMPLAINTS/PURPOSE OF CONSULTATION:  Follow up for multiple myeloma  DIAGNOSIS  IgA lambda R-ISS stage II multiple myeloma with innumerable lytic lesions in the calvarium, lesion in T7 vertebra and multiple lesions in the pelvis.  No significant bone pain at this time.  Current treatment  Status post Vd x 1 cycle VRd x 4 cycles  Planned treatment  Consolidation with Autologous HSCT after 6th cycle of treatment.  Prophylactic medications - aspirin 325 mg by mouth daily. - Acyclovir 400 mg oral twice daily.  HISTORY OF PRESENTING ILLNESS: Please see my initial consultation for details of her initial presentation.  INTERVAL HISTORY  Paula Johnson is here for her scheduled followup for MM . She has now completed 5 cycles of her planned treatment.   She notes no acute new symptoms. Has been feeling well. Bone marrow biopsy showed between 5-10% residual plasma cells. Cytogenetics are pending.  Patient followed up with Dr. Samule Ohm who recommended one additional cycle of treatment prior to transplant.  MEDICAL HISTORY:  Past Medical History  Diagnosis Date  . Prediabetes   . Fibroid     reason for Hysterectomy  . Urinary incontinence   . PONV (postoperative nausea and vomiting)     thinks morphine caused nausea  . Pain aggravated by activities of daily living     states she pulled something at right sternum in dec 2016  . Cancer ([place]) 1999    squamous cell carcinoma of thymus    SURGICAL HISTORY: Past Surgical History  Procedure Laterality Date  . Thymectomy  1999  . Abdominal hysterectomy  1997    TAH--ovaries remain--Dr. Newton Pigg    SOCIAL HISTORY: Social History   Social History  . Marital Status: Widowed    Spouse Name: N/A  . Number of Children: N/A  . Years of Education: N/A   Occupational History    . Not on file.   Social History Main Topics  . Smoking status: Never Smoker   . Smokeless tobacco: Never Used  . Alcohol Use: No  . Drug Use: No  . Sexual Activity:    Partners: Male    Birth Control/ Protection: Surgical     Comment: TAH--ovaries remain   Other Topics Concern  . Not on file   Social History Narrative    FAMILY HISTORY: Family History  Problem Relation Age of Onset  . COPD Father     dec age 54/s-smoked  . Diabetes Brother   . Other Brother     committed suicide age 72  . Hyperlipidemia Mother   . Diabetes Maternal Grandmother     ALLERGIES:  is allergic to ampicillin.  MEDICATIONS:  Current Outpatient Prescriptions  Medication Sig Dispense Refill  . acyclovir (ZOVIRAX) 400 MG tablet Take 1 tablet (400 mg total) by mouth 2 (two) times daily. 60 tablet 2  . aspirin EC 325 MG tablet Take 1 tablet (325 mg total) by mouth daily. 60 tablet 3  . cetirizine (ZYRTEC) 10 MG tablet Take 10 mg by mouth daily.    . cholecalciferol (VITAMIN D) 1000 units tablet Take 2,000 Units by mouth daily.    [facility] dexamethasone (DECADRON) 4 MG tablet Take 10 tablets (40 mg) on days 1, 8, and 15 of chemo. Repeat every 21 days. 30 tablet 3  . famotidine (PEPCID) 10 MG tablet Take 10  mg by mouth 2 (two) times daily.    [facility] Kitchen lenalidomide (REVLIMID) 25 MG capsule Take 1 capsule on days 1-14. Repeat every 21 days.  Authorization # [id number]. 14 capsule 0  . LORazepam (ATIVAN) 0.5 MG tablet Take 1 tablet (0.5 mg total) by mouth every 6 (six) hours as needed (Nausea or vomiting). 30 tablet 0  . ondansetron (ZOFRAN) 8 MG tablet Take 1 tablet (8 mg total) by mouth 2 (two) times daily as needed (Nausea or vomiting). 30 tablet 1  . prochlorperazine (COMPAZINE) 10 MG tablet Take 1 tablet (10 mg total) by mouth every 6 (six) hours as needed (Nausea or vomiting). 30 tablet 1  . senna-docusate (SENNA S) 8.6-50 MG tablet Take 2 tablets by mouth at bedtime. (Patient taking differently: Take 2 tablets by  mouth 2 (two) times daily at 8 am and 10 pm. ) 60 tablet 1   No current facility-administered medications for this visit.    REVIEW OF SYSTEMS:    10 Point review of Systems was done is negative except as noted above.  PHYSICAL EXAMINATION: ECOG PERFORMANCE STATUS: 1 - Symptomatic but completely ambulatory  . Filed Vitals:   02/01/16 1602  BP: 131/72  Pulse: 95  Temp: 98 F (36.7 C)  Resp: 18   Filed Weights   02/01/16 1602  Weight: 134 lb 6.4 oz (60.963 kg)   .Body mass index is 20.14 kg/(m^2).  GENERAL:alert, in no acute distress and comfortable SKIN: skin color, texture, turgor are normal, no rashes or significant lesions EYES: normal, conjunctiva are pink and non-injected, sclera clear OROPHARYNX:no exudate, no erythema and lips, buccal mucosa, and tongue normal  NECK: supple, no JVD, thyroid normal size, non-tender, without nodularity LYMPH:  no palpable lymphadenopathy in the cervical, axillary or inguinal LUNGS: clear to auscultation with normal respiratory effort HEART: regular rate & rhythm,  no murmurs and no lower extremity edema ABDOMEN: abdomen soft, non-tender, normoactive bowel sounds  Musculoskeletal: no cyanosis of digits and no clubbing  PSYCH: alert & oriented x 3 with fluent speech NEURO: no focal motor/sensory deficits  LABORATORY DATA:  I have reviewed the data as listed . CBC Latest Ref Rng 02/08/2016 02/01/2016 01/28/2016  WBC 3.9 - 10.3 10e3/uL 4.6 3.6(L) 2.3(L)  Hemoglobin 11.6 - 15.9 g/dL 12.4 11.9 11.6(L)  Hematocrit 34.8 - 46.6 % 38.2 36.6 36.0  Platelets 145 - 400 10e3/uL 368 169 206    . CMP Latest Ref Rng 02/08/2016 02/01/2016 02/01/2016  Glucose 70 - 140 mg/dl 132 129 -  BUN 7.0 - 26.0 mg/dL 20.4 19.7 -  Creatinine 0.6 - 1.1 mg/dL 0.8 0.8 -  Sodium 136 - 145 mEq/L 142 141 -  Potassium 3.5 - 5.1 mEq/L 4.2 4.1 -  CO2 22 - 29 mEq/L 25 25 -  Calcium 8.4 - 10.4 mg/dL 10.0 9.4 -  Total Protein 6.4 - 8.3 g/dL 7.8 7.4 6.7  Total Bilirubin  0.20 - 1.20 mg/dL 0.66 0.75 -  Alkaline Phos 40 - 150 U/L 84 79 -  AST 5 - 34 U/L 19 25 -  ALT 0 - 55 U/L 20 25 -        RADIOGRAPHIC STUDIES: I have personally reviewed the radiological images as listed and agreed with the findings in the report. Ct Biopsy  01/28/2016  INDICATION: MULTIPLE MYELOMA, RECEIVING CHEMOTHERAPY, PLANNING FOR STEM-CELL TRANSPLANT EXAM: CT GUIDED RIGHT ILIAC BONE MARROW ASPIRATION AND CORE BIOPSY Date:  5/5/20175/01/2016 9:46 am Radiologist:  M. Daryll Brod, MD Guidance:  CT FLUOROSCOPY  TIME:  Fluoroscopy Time: None. MEDICATIONS: 1% lidocaine locally ANESTHESIA/SEDATION: 3.0 mg IV Versed; 50 mcg IV Fentanyl Moderate Sedation Time:  10 minutes The patient was continuously monitored during the procedure by the interventional radiology nurse under my direct supervision. CONTRAST:  None. COMPLICATIONS: None PROCEDURE: Informed consent was obtained from the patient following explanation of the procedure, risks, benefits and alternatives. The patient understands, agrees and consents for the procedure. All questions were addressed. A time out was performed. The patient was positioned prone and non-contrast localization CT was performed of the pelvis to demonstrate the iliac marrow spaces. Maximal barrier sterile technique utilized including caps, mask, sterile gowns, sterile gloves, large sterile drape, hand hygiene, and Betadine prep. Under sterile conditions and local anesthesia, an 11 gauge coaxial bone biopsy needle was advanced into the right iliac marrow space. Needle position was confirmed with CT imaging. Initially, bone marrow aspiration was performed. Next, the 11 gauge outer cannula was utilized to obtain a right iliac bone marrow core biopsy. Needle was removed. Hemostasis was obtained with compression. The patient tolerated the procedure well. Samples were prepared with the cytotechnologist. No immediate complications. IMPRESSION: CT guided right iliac bone marrow  aspiration and core biopsy. Electronically Signed   By: Jerilynn Mages.  Shick M.D.   On: 01/28/2016 09:48    ASSESSMENT & PLAN:    Very pleasant 60 year old Caucasian female with  1) R-ISS Stage II IgA lambda multiple myeloma with standard risk cytogenetics t (11;14). PET/CT shows evidence of lytic bone lesions in the calvarium, iliac bones  and T7. No hypercalcemia or renal failure at this time. Pretreatment M spike of 5 in 2 monoclonal bands. BM bx with 69% plasma cells with Lambda light chain restriction Patient is status post cycle 1 of Vd and 3 cycles of VRd and has she tolerated well other than some grade 1 rash. Also noted to have fevers from Velcade which are controlled with naproxen.   After 5 cycles M-protein was down from 5g/dl to 2.8g/dl to 0.5g/dl to 0.1g/dl Bone marrow biopsy after 5 cycles of treatment shows mild monoclonal plasmacytosis of 5-10%. Mild dyserythropoiesis and dysmegakaryopoiesis also noted  #2 anemia due to multiple myeloma and treatment medications- gradually improving #3 fatigue due to multiple myeloma and Anemiasignificantly improved #4 multiple bone metastases from multiple myeloma. - On Zometa no bone pains currently. #5 Mild B/L flank macular rash likely from Velcade - resolved. Has been taking antihistamines and is using dexamethasone pre-Velcade as instructed. Plan --bone marrow biopsy results were discussed with the patient and her significant other in details. --pending cytogenetics, myeloma and MDS fish panels -- Patient's labs are stable. No  Prohibitive toxicities -- complete cycle 6 of treatment the fifth cycle with Revlimid as per recommendation by Dr Samule Ohm. She will then proceed for Auto HSCT at [place] every 4 weekly for bone metastases last dose this month then will hold for transplant. -Aspirin daily for VTE prophylaxis with Revlimid -acyclovir for shingles prophylaxis ( patient notes that she did get a shingles vaccine about 2 years  ago). --counseled on need for good hydration and focus on good po intake. -OTC Cetrizine and Famotidine for rash. May use topical hydrocortisone for localized rash.   RTC with Dr Irene Limbo in 3 weeks for continued followup. Cbc, cmp Weekly and myeloma panel q 3weeks   I spent 25 minutes counseling the patient face to face. The total time spent in the appointment was 30 minutes and more than 50% was on counseling and direct patient  cares.    Sullivan Lone MD [place] AAHIVMS [facility] Hematology/Oncology Physician [facility]  (Office):       [phone] (Work cell):  [phone] (Fax):           [phone]

## 2016-02-11 ENCOUNTER — Encounter (HOSPITAL_COMMUNITY): Payer: Self-pay

## 2016-02-11 ENCOUNTER — Ambulatory Visit (HOSPITAL_BASED_OUTPATIENT_CLINIC_OR_DEPARTMENT_OTHER): Payer: BLUE CROSS/BLUE SHIELD

## 2016-02-11 VITALS — BP 118/76 | HR 91 | Temp 97.2°F | Resp 18

## 2016-02-11 DIAGNOSIS — Z5112 Encounter for antineoplastic immunotherapy: Secondary | ICD-10-CM

## 2016-02-11 DIAGNOSIS — C9000 Multiple myeloma not having achieved remission: Secondary | ICD-10-CM | POA: Diagnosis not present

## 2016-02-11 MED ORDER — PROCHLORPERAZINE MALEATE 10 MG PO TABS
ORAL_TABLET | ORAL | Status: AC
  Filled 2016-02-11: qty 1

## 2016-02-11 MED ORDER — BORTEZOMIB CHEMO SQ INJECTION 3.5 MG (2.5MG/ML)
1.3000 mg/m2 | Freq: Once | INTRAMUSCULAR | Status: AC
  Administered 2016-02-11: 2.25 mg via SUBCUTANEOUS
  Filled 2016-02-11: qty 2.25

## 2016-02-11 MED ORDER — PROCHLORPERAZINE MALEATE 10 MG PO TABS
10.0000 mg | ORAL_TABLET | Freq: Once | ORAL | Status: AC
  Administered 2016-02-11: 10 mg via ORAL

## 2016-02-14 ENCOUNTER — Ambulatory Visit: Payer: Self-pay

## 2016-02-15 ENCOUNTER — Other Ambulatory Visit: Payer: Self-pay | Admitting: *Deleted

## 2016-02-15 ENCOUNTER — Other Ambulatory Visit (HOSPITAL_BASED_OUTPATIENT_CLINIC_OR_DEPARTMENT_OTHER): Payer: BLUE CROSS/BLUE SHIELD

## 2016-02-15 ENCOUNTER — Ambulatory Visit: Payer: Self-pay

## 2016-02-15 ENCOUNTER — Ambulatory Visit (HOSPITAL_BASED_OUTPATIENT_CLINIC_OR_DEPARTMENT_OTHER): Payer: BLUE CROSS/BLUE SHIELD

## 2016-02-15 ENCOUNTER — Other Ambulatory Visit: Payer: Self-pay

## 2016-02-15 VITALS — BP 116/72 | HR 98 | Temp 98.0°F | Resp 17

## 2016-02-15 DIAGNOSIS — Z5112 Encounter for antineoplastic immunotherapy: Secondary | ICD-10-CM | POA: Diagnosis not present

## 2016-02-15 DIAGNOSIS — C9000 Multiple myeloma not having achieved remission: Secondary | ICD-10-CM | POA: Diagnosis not present

## 2016-02-15 LAB — CBC & DIFF AND RETIC
BASO%: 0 % (ref 0.0–2.0)
BASOS ABS: 0 10*3/uL (ref 0.0–0.1)
EOS%: 0 % (ref 0.0–7.0)
Eosinophils Absolute: 0 10*3/uL (ref 0.0–0.5)
HEMATOCRIT: 38.1 % (ref 34.8–46.6)
HGB: 12.4 g/dL (ref 11.6–15.9)
Immature Retic Fract: 2.7 % (ref 1.60–10.00)
LYMPH#: 0.4 10*3/uL — AB (ref 0.9–3.3)
LYMPH%: 8.1 % — AB (ref 14.0–49.7)
MCH: 30.5 pg (ref 25.1–34.0)
MCHC: 32.5 g/dL (ref 31.5–36.0)
MCV: 93.8 fL (ref 79.5–101.0)
MONO#: 0.1 10*3/uL (ref 0.1–0.9)
MONO%: 2.9 % (ref 0.0–14.0)
NEUT#: 4.3 10*3/uL (ref 1.5–6.5)
NEUT%: 89 % — AB (ref 38.4–76.8)
PLATELETS: 284 10*3/uL (ref 145–400)
RBC: 4.06 10*6/uL (ref 3.70–5.45)
RDW: 15.5 % — ABNORMAL HIGH (ref 11.2–14.5)
RETIC CT ABS: 46.28 10*3/uL (ref 33.70–90.70)
Retic %: 1.14 % (ref 0.70–2.10)
WBC: 4.8 10*3/uL (ref 3.9–10.3)

## 2016-02-15 LAB — COMPREHENSIVE METABOLIC PANEL
ALT: 17 U/L (ref 0–55)
ANION GAP: 10 meq/L (ref 3–11)
AST: 14 U/L (ref 5–34)
Albumin: 4.1 g/dL (ref 3.5–5.0)
Alkaline Phosphatase: 84 U/L (ref 40–150)
BILIRUBIN TOTAL: 0.56 mg/dL (ref 0.20–1.20)
BUN: 17.6 mg/dL (ref 7.0–26.0)
CALCIUM: 9.5 mg/dL (ref 8.4–10.4)
CHLORIDE: 104 meq/L (ref 98–109)
CO2: 24 mEq/L (ref 22–29)
CREATININE: 0.8 mg/dL (ref 0.6–1.1)
EGFR: 86 mL/min/{1.73_m2} — AB (ref >90)
Glucose: 148 mg/dl — ABNORMAL HIGH (ref 70–140)
Potassium: 4.2 mEq/L (ref 3.5–5.1)
Sodium: 139 mEq/L (ref 136–145)
TOTAL PROTEIN: 7.1 g/dL (ref 6.4–8.3)

## 2016-02-15 MED ORDER — BORTEZOMIB CHEMO SQ INJECTION 3.5 MG (2.5MG/ML)
1.3000 mg/m2 | Freq: Once | INTRAMUSCULAR | Status: AC
  Administered 2016-02-15: 2.25 mg via SUBCUTANEOUS
  Filled 2016-02-15: qty 2.25

## 2016-02-15 MED ORDER — PROCHLORPERAZINE MALEATE 10 MG PO TABS
ORAL_TABLET | ORAL | Status: AC
  Filled 2016-02-15: qty 1

## 2016-02-15 MED ORDER — LENALIDOMIDE 25 MG PO CAPS: ORAL_CAPSULE | ORAL | Status: DC

## 2016-02-15 MED ORDER — PROCHLORPERAZINE MALEATE 10 MG PO TABS
10.0000 mg | ORAL_TABLET | Freq: Once | ORAL | Status: AC
  Administered 2016-02-15: 10 mg via ORAL

## 2016-02-15 NOTE — Patient Instructions (Signed)
[facility] Discharge Instructions for Patients Receiving Chemotherapy  Today you received the following chemotherapy agents Velcade. To help prevent nausea and vomiting after your treatment, we encourage you to take your nausea medication as directed.  If you develop nausea and vomiting that is not controlled by your nausea medication, call the clinic.   BELOW ARE SYMPTOMS THAT SHOULD BE REPORTED IMMEDIATELY:  *FEVER GREATER THAN 100.5 F  *CHILLS WITH OR WITHOUT FEVER  NAUSEA AND VOMITING THAT IS NOT CONTROLLED WITH YOUR NAUSEA MEDICATION  *UNUSUAL SHORTNESS OF BREATH  *UNUSUAL BRUISING OR BLEEDING  TENDERNESS IN MOUTH AND THROAT WITH OR WITHOUT PRESENCE OF ULCERS  *URINARY PROBLEMS  *BOWEL PROBLEMS  UNUSUAL RASH Items with * indicate a potential emergency and should be followed up as soon as possible.  Feel free to call the clinic you have any questions or concerns. The clinic phone number is [phone].  Please show the CHEMO ALERT CARD at check-in to the Emergency Department and triage nurse.    

## 2016-02-16 ENCOUNTER — Ambulatory Visit: Payer: Self-pay

## 2016-02-18 ENCOUNTER — Ambulatory Visit (HOSPITAL_BASED_OUTPATIENT_CLINIC_OR_DEPARTMENT_OTHER): Payer: BLUE CROSS/BLUE SHIELD

## 2016-02-18 ENCOUNTER — Telehealth: Payer: Self-pay

## 2016-02-18 VITALS — BP 111/77 | HR 97 | Temp 97.0°F | Resp 18

## 2016-02-18 DIAGNOSIS — C9000 Multiple myeloma not having achieved remission: Secondary | ICD-10-CM

## 2016-02-18 DIAGNOSIS — Z5112 Encounter for antineoplastic immunotherapy: Secondary | ICD-10-CM

## 2016-02-18 MED ORDER — PROCHLORPERAZINE MALEATE 10 MG PO TABS
10.0000 mg | ORAL_TABLET | Freq: Once | ORAL | Status: AC
  Administered 2016-02-18: 10 mg via ORAL

## 2016-02-18 MED ORDER — PROCHLORPERAZINE MALEATE 10 MG PO TABS
ORAL_TABLET | ORAL | Status: AC
  Filled 2016-02-18: qty 1

## 2016-02-18 MED ORDER — BORTEZOMIB CHEMO SQ INJECTION 3.5 MG (2.5MG/ML)
1.3000 mg/m2 | Freq: Once | INTRAMUSCULAR | Status: AC
  Administered 2016-02-18: 2.25 mg via SUBCUTANEOUS
  Filled 2016-02-18: qty 2.25

## 2016-02-18 NOTE — Telephone Encounter (Signed)
.  Oral Chemotherapy Follow-Up Form  Original Start date of oral chemotherapy: _2-10-17______   Called patient today to follow up regarding patient's oral chemotherapy medication: ___Revlimid_  Pt is doing well today and this is her last cycle and she is now scheduled for transplant.  Patient feels well and looks great.  No issues expressed and we wish her the best in her transplant.   Pt reports __0__ tablets/doses missed in the last week/month.  Missed dose(s) attributed to: ____________  Pt reports the following side effects: ___________  Other Issues: _________   Will follow up and call patient again in _TBA________   Thank you,  Henreitta Leber, PharmD Oral Oncology [facility]

## 2016-02-18 NOTE — Patient Instructions (Signed)
[facility] Discharge Instructions for Patients Receiving Chemotherapy  Today you received the following chemotherapy agents Velcade. To help prevent nausea and vomiting after your treatment, we encourage you to take your nausea medication as directed.  If you develop nausea and vomiting that is not controlled by your nausea medication, call the clinic.   BELOW ARE SYMPTOMS THAT SHOULD BE REPORTED IMMEDIATELY:  *FEVER GREATER THAN 100.5 F  *CHILLS WITH OR WITHOUT FEVER  NAUSEA AND VOMITING THAT IS NOT CONTROLLED WITH YOUR NAUSEA MEDICATION  *UNUSUAL SHORTNESS OF BREATH  *UNUSUAL BRUISING OR BLEEDING  TENDERNESS IN MOUTH AND THROAT WITH OR WITHOUT PRESENCE OF ULCERS  *URINARY PROBLEMS  *BOWEL PROBLEMS  UNUSUAL RASH Items with * indicate a potential emergency and should be followed up as soon as possible.  Feel free to call the clinic you have any questions or concerns. The clinic phone number is [phone].  Please show the CHEMO ALERT CARD at check-in to the Emergency Department and triage nurse.    

## 2016-02-22 ENCOUNTER — Ambulatory Visit (HOSPITAL_BASED_OUTPATIENT_CLINIC_OR_DEPARTMENT_OTHER): Payer: BLUE CROSS/BLUE SHIELD | Admitting: Hematology

## 2016-02-22 VITALS — BP 112/65 | HR 91 | Temp 98.3°F | Resp 18 | Ht 68.5 in | Wt 135.5 lb

## 2016-02-22 DIAGNOSIS — D630 Anemia in neoplastic disease: Secondary | ICD-10-CM | POA: Diagnosis not present

## 2016-02-22 DIAGNOSIS — C9000 Multiple myeloma not having achieved remission: Secondary | ICD-10-CM

## 2016-02-22 NOTE — Progress Notes (Signed)
Marland Kitchen    HEMATOLOGY/ONCOLOGY CLINIC NOTE  Date of Service: 02/22/2016  Patient Care Team: [name], MD as PCP - General (Family Medicine)  CHIEF COMPLAINTS/PURPOSE OF CONSULTATION:  Follow up for multiple myeloma  DIAGNOSIS  IgA lambda R-ISS stage II multiple myeloma with innumerable lytic lesions in the calvarium, lesion in T7 vertebra and multiple lesions in the pelvis.  No significant bone pain at this time.  Current treatment  Status post Vd x 1 cycle VRd x 5 cycles  Planned treatment  Consolidation with Autologous HSCT after 6th cycle of treatment.  Prophylactic medications - aspirin 325 mg by mouth daily. - Acyclovir 400 mg oral twice daily.  HISTORY OF PRESENTING ILLNESS: Please see my initial consultation for details of her initial presentation.  INTERVAL HISTORY  Mrs. Chamorro is here for her scheduled followup for MM . She has now completed 6 cycles of her planned treatment.   She notes no acute new symptoms. Has been feeling well. Bone marrow biopsy showed between 5-10% residual plasma cells. Cytogenetics are show no residual clonal t(11;14) translocation. Patient is set up to follow-up for her high-dose chemotherapy and autologous stem cell transplantation early June 2017. She has taken next 3 months off for her transplantation. 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 Notes that she is a little anxious about the transplant but otherwise doing well. Had an SPEP and IFE done at [facility] on 02/09/2016 that showed no M protein but IFE was positive for clonal IgA lambda paraprotein.  MEDICAL HISTORY:  Past Medical History  Diagnosis Date  . Prediabetes   . Fibroid     reason for Hysterectomy  . Urinary incontinence   . PONV (postoperative nausea and vomiting)     thinks morphine caused nausea  . Pain  aggravated by activities of daily living     states she pulled something at right sternum in dec 2016  . Cancer ([place]) 1999    squamous cell carcinoma of thymus    SURGICAL HISTORY: Past Surgical History  Procedure Laterality Date  . Thymectomy  1999  . Abdominal hysterectomy  1997    TAH--ovaries remain--Dr. Newton Pigg    SOCIAL HISTORY: Social History   Social History  . Marital Status: Widowed    Spouse Name: N/A  . Number of Children: N/A  . Years of Education: N/A   Occupational History  . Not on file.   Social History Main Topics  . Smoking status: Never Smoker   . Smokeless tobacco: Never Used  . Alcohol Use: No  . Drug Use: No  . Sexual Activity:    Partners: Male    Birth Control/ Protection: Surgical     Comment: TAH--ovaries remain   Other Topics Concern  . Not on file   Social History Narrative    FAMILY HISTORY: Family History  Problem Relation Age of Onset  . COPD Father     dec age 44/s-smoked  . Diabetes Brother   . Other Brother     committed suicide age 31  . Hyperlipidemia Mother   . Diabetes Maternal Grandmother     ALLERGIES:  is allergic to ampicillin.  MEDICATIONS:  Current Outpatient Prescriptions  Medication Sig Dispense Refill  . acyclovir (ZOVIRAX) 400 MG tablet Take 1 tablet (400 mg total) by mouth 2 (two) times daily. 60 tablet 2  .  aspirin EC 325 MG tablet Take 1 tablet (325 mg total) by mouth daily. 60 tablet 3  . cetirizine (ZYRTEC) 10 MG tablet Take 10 mg by mouth daily.    . cholecalciferol (VITAMIN D) 1000 units tablet Take 2,000 Units by mouth daily.    Marland Kitchen dexamethasone (DECADRON) 4 MG tablet Take 10 tablets (40 mg) on days 1, 8, and 15 of chemo. Repeat every 21 days. 30 tablet 3  . famotidine (PEPCID) 10 MG tablet Take 10 mg by mouth 2 (two) times daily.    Marland Kitchen LORazepam (ATIVAN) 0.5 MG tablet Take 1 tablet (0.5 mg total) by mouth every 6 (six) hours as needed (Nausea or vomiting). 30 tablet 0  . ondansetron  (ZOFRAN) 8 MG tablet Take 1 tablet (8 mg total) by mouth 2 (two) times daily as needed (Nausea or vomiting). 30 tablet 1  . prochlorperazine (COMPAZINE) 10 MG tablet Take 1 tablet (10 mg total) by mouth every 6 (six) hours as needed (Nausea or vomiting). 30 tablet 1  . senna-docusate (SENNA S) 8.6-50 MG tablet Take 2 tablets by mouth at bedtime. (Patient taking differently: Take 2 tablets by mouth 2 (two) times daily at 8 am and 10 pm. ) 60 tablet 1   No current facility-administered medications for this visit.    REVIEW OF SYSTEMS:    10 Point review of Systems was done is negative except as noted above.  PHYSICAL EXAMINATION: ECOG PERFORMANCE STATUS: 1 - Symptomatic but completely ambulatory  . Filed Vitals:   02/22/16 1324  BP: 112/65  Pulse: 91  Temp: 98.3 F (36.8 C)  Resp: 18   Filed Weights   02/22/16 1324  Weight: 135 lb 8 oz (61.462 kg)   .Body mass index is 20.3 kg/(m^2).  GENERAL:alert, in no acute distress and comfortable SKIN: skin color, texture, turgor are normal, no rashes or significant lesions EYES: normal, conjunctiva are pink and non-injected, sclera clear OROPHARYNX:no exudate, no erythema and lips, buccal mucosa, and tongue normal  NECK: supple, no JVD, thyroid normal size, non-tender, without nodularity LYMPH:  no palpable lymphadenopathy in the cervical, axillary or inguinal LUNGS: clear to auscultation with normal respiratory effort HEART: regular rate & rhythm,  no murmurs and no lower extremity edema ABDOMEN: abdomen soft, non-tender, normoactive bowel sounds  Musculoskeletal: no cyanosis of digits and no clubbing  PSYCH: alert & oriented x 3 with fluent speech NEURO: no focal motor/sensory deficits  LABORATORY DATA:  I have reviewed the data as listed . CBC Latest Ref Rng 02/15/2016 02/08/2016 02/01/2016  WBC 3.9 - 10.3 10e3/uL 4.8 4.6 3.6(L)  Hemoglobin 11.6 - 15.9 g/dL 12.4 12.4 11.9  Hematocrit 34.8 - 46.6 % 38.1 38.2 36.6  Platelets 145 -  400 10e3/uL 284 368 169    . CMP Latest Ref Rng 02/15/2016 02/08/2016 02/01/2016  Glucose 70 - 140 mg/dl 148(H) 132 129  BUN 7.0 - 26.0 mg/dL 17.6 20.4 19.7  Creatinine 0.6 - 1.1 mg/dL 0.8 0.8 0.8  Sodium 136 - 145 mEq/L 139 142 141  Potassium 3.5 - 5.1 mEq/L 4.2 4.2 4.1  CO2 22 - 29 mEq/L 24 25 25   Calcium 8.4 - 10.4 mg/dL 9.5 10.0 9.4  Total Protein 6.4 - 8.3 g/dL 7.1 7.8 7.4  Total Bilirubin 0.20 - 1.20 mg/dL 0.56 0.66 0.75  Alkaline Phos 40 - 150 U/L 84 84 79  AST 5 - 34 U/L 14 19 25   ALT 0 - 55 U/L 17 20 25     RADIOGRAPHIC STUDIES: I  have personally reviewed the radiological images as listed and agreed with the findings in the report. Ct Biopsy  01/28/2016  INDICATION: MULTIPLE MYELOMA, RECEIVING CHEMOTHERAPY, PLANNING FOR STEM-CELL TRANSPLANT EXAM: CT GUIDED RIGHT ILIAC BONE MARROW ASPIRATION AND CORE BIOPSY Date:  5/5/20175/01/2016 9:46 am Radiologist:  M. Daryll Brod, MD Guidance:  CT FLUOROSCOPY TIME:  Fluoroscopy Time: None. MEDICATIONS: 1% lidocaine locally ANESTHESIA/SEDATION: 3.0 mg IV Versed; 50 mcg IV Fentanyl Moderate Sedation Time:  10 minutes The patient was continuously monitored during the procedure by the interventional radiology nurse under my direct supervision. CONTRAST:  None. COMPLICATIONS: None PROCEDURE: Informed consent was obtained from the patient following explanation of the procedure, risks, benefits and alternatives. The patient understands, agrees and consents for the procedure. All questions were addressed. A time out was performed. The patient was positioned prone and non-contrast localization CT was performed of the pelvis to demonstrate the iliac marrow spaces. Maximal barrier sterile technique utilized including caps, mask, sterile gowns, sterile gloves, large sterile drape, hand hygiene, and Betadine prep. Under sterile conditions and local anesthesia, an 11 gauge coaxial bone biopsy needle was advanced into the right iliac marrow space. Needle position was  confirmed with CT imaging. Initially, bone marrow aspiration was performed. Next, the 11 gauge outer cannula was utilized to obtain a right iliac bone marrow core biopsy. Needle was removed. Hemostasis was obtained with compression. The patient tolerated the procedure well. Samples were prepared with the cytotechnologist. No immediate complications. IMPRESSION: CT guided right iliac bone marrow aspiration and core biopsy. Electronically Signed   By: Jerilynn Mages.  Shick M.D.   On: 01/28/2016 09:48    ASSESSMENT & PLAN:    Very pleasant 60 year old Caucasian female with  1) R-ISS Stage II IgA lambda multiple myeloma with standard risk cytogenetics t (11;14). PET/CT shows evidence of lytic bone lesions in the calvarium, iliac bones  and T7. No hypercalcemia or renal failure at this time. Pretreatment M spike of 5 in 2 monoclonal bands. BM bx with 69% plasma cells with Lambda light chain restriction Patient is status post cycle 1 of Vd and 5 cycles of VRd and has she tolerated well other than some grade 1 rash. Also noted to have fevers from Velcade which are controlled with naproxen.   After 5 cycles M-protein was down from 5g/dl to 2.8g/dl to 0.5g/dl to 0.1g/dl Bone marrow biopsy after 5 cycles of treatment shows mild monoclonal plasmacytosis of 5-10%. Mild dyserythropoiesis and dysmegakaryopoiesis also noted  #2 anemia due to multiple myeloma and treatment medications- resolved #3 fatigue due to multiple myeloma and Anemia - resolved #4 multiple bone metastases from multiple myeloma. - Completed Zometa x 5 doses currently on hold for transplant. #5 Mild B/L flank macular rash likely from Velcade - resolved. Has been taking antihistamines and is using dexamethasone pre-Velcade as instructed. Plan --Patient has completed her planned 6 cycles of treatment. Patient's labs are stable. No  Prohibitive toxicities. No M spike on SPEP at [facility] on 02/09/2016. Monoclonal IgA lambda protein present on IFE. -- As  per recommendation by Dr Samule Ohm she will then proceed for Auto HSCT at [facility]. --We'll defer the use of aspirin and acyclovir peritransplant to the transplant team. --counseled on need for good hydration and focus on good po intake.  Peritransplant management as per Dr. Kendell Bane team at [facility]. I informed the patient that I shall be available for any concerns or questions.   RTC with Dr Irene Limbo after day 100 posttransplant for continued follow-up and maintenance treatment .  I spent 20 minutes counseling the patient face to face. The total time spent in the appointment was 20 minutes and more than 50% was on counseling and direct patient cares.    Sullivan Lone MD [place] AAHIVMS [facility] Hematology/Oncology Physician [facility]  (Office):       [phone] (Work cell):  [phone] (Fax):           [phone]

## 2016-02-23 ENCOUNTER — Telehealth: Payer: Self-pay | Admitting: Hematology

## 2016-02-23 NOTE — Telephone Encounter (Signed)
cld & spoke the pt and adv of appt of 8/31 @ 3:00

## 2016-02-28 ENCOUNTER — Telehealth: Payer: Self-pay | Admitting: Obstetrics and Gynecology

## 2016-02-28 NOTE — Telephone Encounter (Signed)
Patient returned call

## 2016-02-28 NOTE — Telephone Encounter (Signed)
Return call to patient. She states that "for a couple of months" She has been having some vaginal bleeding when placing her pessary. She has removed her pessary and is no longer having any vaginal bleeding. She states she feels well with it out, no urinary complaints, no difficulty voiding or having a bowel movement.  She currently has plans to set up a bone marrow transplant and wants to have a recheck of her pessary before she plans procedure.  She is traveling out town from 03/31/16 to 04/07/16. Requests office visit for evaluation when returns to town.  She denies fevers or heavy vaginal bleeding. Denies pain. Office visit with Dr. Quincy Simmonds 03/09/16 at 1530.  She is advised to call back with any concerns or increasing symptoms for office visit. She verbalizes understanding and will call back with any concerns.

## 2016-02-28 NOTE — Telephone Encounter (Signed)
Message left to return call to [place] at [phone].   Last office visit 05/14/15, had vaginal inflammation with pessary. Estrace Cream ordered after appointment.

## 2016-02-28 NOTE — Telephone Encounter (Signed)
Patient is having bleeding with her pessary and requests to see Dr. Quincy Simmonds.

## 2016-03-09 ENCOUNTER — Encounter: Payer: Self-pay | Admitting: Obstetrics and Gynecology

## 2016-03-09 ENCOUNTER — Ambulatory Visit (INDEPENDENT_AMBULATORY_CARE_PROVIDER_SITE_OTHER): Payer: BLUE CROSS/BLUE SHIELD | Admitting: Obstetrics and Gynecology

## 2016-03-09 VITALS — BP 110/64 | HR 84 | Resp 16 | Ht 67.0 in | Wt 132.0 lb

## 2016-03-09 DIAGNOSIS — N993 Prolapse of vaginal vault after hysterectomy: Secondary | ICD-10-CM

## 2016-03-09 MED ORDER — SURGILUBE EX GEL: CUTANEOUS | Status: DC | PRN

## 2016-03-09 NOTE — Progress Notes (Signed)
Patient ID: Paula Johnson, female   DOB: [birth year], 60 y.o.   MRN: [id number] GYNECOLOGY  VISIT   HPI: 60 y.o.   Widowed  Caucasian  female   G2P2002 with No LMP recorded. Patient has had a hysterectomy.   here for pessary check.  Bleeding with pessary for about 2 months as per patient. Pessary is currently out.   Patient is not using the estrogen cream due to the multiple myeloma.  Still using the pessary but not the estrogen. Using water only.   GSI is worse with pessary but overall happy with it.  No pain.   Has an upcoming bone marrow transplant.   GYNECOLOGIC HISTORY: No LMP recorded. Patient has had a hysterectomy. Contraception:  Hysterectomy Menopausal hormone therapy:  none Last mammogram:  06-25-15 Density C/Neg/BiRads1:[facility] Last pap smear:   2015 Neg        OB History    Gravida Para Term Preterm AB TAB SAB Ectopic Multiple Living   2 2 2       2          Patient Active Problem List   Diagnosis Date Noted  . Near syncope 01/04/2016  . Peripheral edema 12/19/2015  . Cough 11/30/2015  . Fever 11/08/2015  . Hematuria 11/08/2015  . Rash 11/01/2015  . Multiple myeloma ([place]) 10/19/2015  . Metastatic multiple myeloma to bone ([place]) 10/19/2015  . Absolute anemia   . Plasma cell dyscrasia 10/05/2015  . Anemia 09/29/2015  . Hypergammaglobulinemia 09/29/2015  . HYPOXEMIA 12/16/2007  . HYPERLIPIDEMIA 12/04/2007  . DYSPNEA 12/04/2007  . MALIGNANT NEOPLASM OF THYMUS 12/03/2007  . OBSTRUCTIVE SLEEP APNEA 12/03/2007  . RESTLESS LEGS SYNDROME 12/03/2007    Past Medical History  Diagnosis Date  . Prediabetes   . Fibroid     reason for Hysterectomy  . Urinary incontinence   . PONV (postoperative nausea and vomiting)     thinks morphine caused nausea  . Pain aggravated by activities of daily living     states she pulled something at right sternum in dec 2016  . Cancer ([place]) 1999    squamous cell carcinoma of thymus    Past Surgical History   Procedure Laterality Date  . Thymectomy  1999  . Abdominal hysterectomy  1997    TAH--ovaries remain--Dr. Newton Pigg    Current Outpatient Prescriptions  Medication Sig Dispense Refill  . acyclovir (ZOVIRAX) 400 MG tablet Take 1 tablet (400 mg total) by mouth 2 (two) times daily. 60 tablet 2  . aspirin EC 325 MG tablet Take 1 tablet (325 mg total) by mouth daily. 60 tablet 3  . cetirizine (ZYRTEC) 10 MG tablet Take 10 mg by mouth daily.    . cholecalciferol (VITAMIN D) 1000 units tablet Take 2,000 Units by mouth daily.    Marland Kitchen dexamethasone (DECADRON) 4 MG tablet Take 10 tablets (40 mg) on days 1, 8, and 15 of chemo. Repeat every 21 days. 30 tablet 3  . famotidine (PEPCID) 10 MG tablet Take 10 mg by mouth 2 (two) times daily.    Marland Kitchen LORazepam (ATIVAN) 0.5 MG tablet Take 1 tablet (0.5 mg total) by mouth every 6 (six) hours as needed (Nausea or vomiting). 30 tablet 0  . ondansetron (ZOFRAN) 8 MG tablet Take 1 tablet (8 mg total) by mouth 2 (two) times daily as needed (Nausea or vomiting). 30 tablet 1  . prochlorperazine (COMPAZINE) 10 MG tablet Take 1 tablet (10 mg total) by mouth every 6 (six) hours as  needed (Nausea or vomiting). 30 tablet 1  . senna-docusate (SENNA S) 8.6-50 MG tablet Take 2 tablets by mouth at bedtime. (Patient taking differently: Take 2 tablets by mouth 2 (two) times daily at 8 am and 10 pm. ) 60 tablet 1   No current facility-administered medications for this visit.     ALLERGIES: Ampicillin  Family History  Problem Relation Age of Onset  . COPD Father     dec age 73/s-smoked  . Diabetes Brother   . Other Brother     committed suicide age 79  . Hyperlipidemia Mother   . Diabetes Maternal Grandmother     Social History   Social History  . Marital Status: Widowed    Spouse Name: N/A  . Number of Children: N/A  . Years of Education: N/A   Occupational History  . Not on file.   Social History Main Topics  . Smoking status: Never Smoker   . Smokeless  tobacco: Never Used  . Alcohol Use: No  . Drug Use: No  . Sexual Activity:    Partners: Male    Birth Control/ Protection: Surgical     Comment: TAH--ovaries remain   Other Topics Concern  . Not on file   Social History Narrative    ROS:  Pertinent items are noted in HPI.  PHYSICAL EXAMINATION:    There were no vitals taken for this visit.    General appearance: alert, cooperative and appears stated age   Pelvic: External genitalia:  no lesions              Urethra:  normal appearing urethra with no masses, tenderness or lesions              Bartholins and Skenes: normal                 Vagina: normal appearing vagina with normal color and discharge, no lesions.  Left vaginal wall with thin mucosa but no ulceration of erythema.  No granulation tissue.               Cervix:  Absent..   Bimanual Exam:  Uterus:  uterus absent              Adnexa: no mass, fullness, tenderness           Chaperone was present for exam.  ASSESSMENT  Vaginal bleeding with pessary use.  Unable to use estrogen tx due to upcoming bone marrow transplant.  Use bacteriostatic water based lubricant.  Rx to pharmacy.  Can also use cooking oils but the bacteriostatic lubricant is much preferred at this time due to her future transplant. Follow up prn.   PLAN     An After Visit Summary was printed and given to the patient.  ___15___ minutes face to face time of which over 50% was spent in counseling.

## 2016-03-10 ENCOUNTER — Telehealth: Payer: Self-pay | Admitting: Obstetrics and Gynecology

## 2016-03-10 MED ORDER — SURGILUBE EX GEL
CUTANEOUS | Status: DC | PRN
Start: 2016-03-10 — End: 2016-05-23

## 2016-03-10 NOTE — Telephone Encounter (Signed)
Patient was seen yesterday and her prescription has not been sent to her pharmacy. Patient does not know the name of the prescription (bacteriostatic.gel?) Confirmed pharmacy with patient.

## 2016-03-10 NOTE — Telephone Encounter (Signed)
Per review of patient's chart rx for lubricant (surgical lubricant) Apply topically as needed. Prescription is to be bacteriostatic. Box 120.49 grams. Was printed at the patient's appointment. Rx has been resent electronically to the patient's pharmacy on file.   Left message for patient to call [place] at [phone].  Please notify patient rx has been sent to pharmacy on file.

## 2016-03-10 NOTE — Telephone Encounter (Signed)
Spoke with patient. Advised patient has been sent to pharmacy on file at this time as rx was printed at her OV. She is agreeable and verbalizes understanding.  Routing to provider for final review. Patient agreeable to disposition. Will close encounter.

## 2016-03-13 ENCOUNTER — Telehealth: Payer: Self-pay | Admitting: *Deleted

## 2016-03-13 ENCOUNTER — Other Ambulatory Visit: Payer: Self-pay | Admitting: *Deleted

## 2016-03-13 NOTE — Telephone Encounter (Signed)
Pharmacy sent a fax saying that they do not carry the surgical lubricant that was sent in last week. - please advise, thanks-eh

## 2016-03-13 NOTE — Telephone Encounter (Signed)
Left message to call -eh 

## 2016-03-13 NOTE — Telephone Encounter (Signed)
Ok to use regular water based lubricant - KY jelly, Replens, or Astroglide.  If she finds a pharmacy that carries a bacteriostatic water based lubricant, I am happy to prescribe this.  I do not know of one to direct her to at this time.  (In reality, the vaginal ecosystem does have bacteria in it.)

## 2016-03-29 DIAGNOSIS — Z9481 Bone marrow transplant status: Secondary | ICD-10-CM

## 2016-03-29 HISTORY — DX: Bone marrow transplant status: Z94.81

## 2016-04-03 ENCOUNTER — Telehealth: Payer: Self-pay | Admitting: Hematology

## 2016-04-03 NOTE — Telephone Encounter (Signed)
physician called to schedule hospital f/u with [facility]. Confirmed appt and will review appt with pt at discharge.

## 2016-04-11 ENCOUNTER — Telehealth: Payer: Self-pay | Admitting: Hematology

## 2016-04-11 ENCOUNTER — Ambulatory Visit (HOSPITAL_BASED_OUTPATIENT_CLINIC_OR_DEPARTMENT_OTHER): Payer: BLUE CROSS/BLUE SHIELD | Admitting: Hematology

## 2016-04-11 ENCOUNTER — Other Ambulatory Visit (HOSPITAL_BASED_OUTPATIENT_CLINIC_OR_DEPARTMENT_OTHER): Payer: BLUE CROSS/BLUE SHIELD

## 2016-04-11 VITALS — BP 105/63 | HR 108 | Temp 98.3°F | Resp 18 | Ht 67.0 in | Wt 125.4 lb

## 2016-04-11 DIAGNOSIS — C9000 Multiple myeloma not having achieved remission: Secondary | ICD-10-CM

## 2016-04-11 LAB — CBC & DIFF AND RETIC
BASO%: 0.9 % (ref 0.0–2.0)
BASOS ABS: 0 10*3/uL (ref 0.0–0.1)
EOS%: 0.4 % (ref 0.0–7.0)
Eosinophils Absolute: 0 10*3/uL (ref 0.0–0.5)
HCT: 33.4 % — ABNORMAL LOW (ref 34.8–46.6)
HGB: 11.4 g/dL — ABNORMAL LOW (ref 11.6–15.9)
IMMATURE RETIC FRACT: 2.8 % (ref 1.60–10.00)
LYMPH#: 1.1 10*3/uL (ref 0.9–3.3)
LYMPH%: 23.2 % (ref 14.0–49.7)
MCH: 30.3 pg (ref 25.1–34.0)
MCHC: 34.1 g/dL (ref 31.5–36.0)
MCV: 88.8 fL (ref 79.5–101.0)
MONO#: 0.8 10*3/uL (ref 0.1–0.9)
MONO%: 17.2 % — ABNORMAL HIGH (ref 0.0–14.0)
NEUT#: 2.7 10*3/uL (ref 1.5–6.5)
NEUT%: 58.3 % (ref 38.4–76.8)
PLATELETS: 188 10*3/uL (ref 145–400)
RBC: 3.76 10*6/uL (ref 3.70–5.45)
RDW: 14.7 % — ABNORMAL HIGH (ref 11.2–14.5)
RETIC CT ABS: 40.61 10*3/uL (ref 33.70–90.70)
Retic %: 1.08 % (ref 0.70–2.10)
WBC: 4.7 10*3/uL (ref 3.9–10.3)

## 2016-04-11 LAB — COMPREHENSIVE METABOLIC PANEL
ALT: 25 U/L (ref 0–55)
ANION GAP: 8 meq/L (ref 3–11)
AST: 18 U/L (ref 5–34)
Albumin: 3.4 g/dL — ABNORMAL LOW (ref 3.5–5.0)
Alkaline Phosphatase: 72 U/L (ref 40–150)
BUN: 17.7 mg/dL (ref 7.0–26.0)
CHLORIDE: 107 meq/L (ref 98–109)
CO2: 28 meq/L (ref 22–29)
Calcium: 9.6 mg/dL (ref 8.4–10.4)
Creatinine: 0.7 mg/dL (ref 0.6–1.1)
EGFR: >90 mL/min/{1.73_m2} (ref >90)
Glucose: 105 mg/dl (ref 70–140)
POTASSIUM: 4.1 meq/L (ref 3.5–5.1)
Sodium: 142 mEq/L (ref 136–145)
TOTAL PROTEIN: 6.7 g/dL (ref 6.4–8.3)
Total Bilirubin: <0.3 mg/dL (ref 0.20–1.20)

## 2016-04-11 NOTE — Telephone Encounter (Signed)
spoke w/ pt confirmed apt times

## 2016-04-12 NOTE — Progress Notes (Signed)
Paula Johnson Kitchen    HEMATOLOGY/ONCOLOGY CLINIC NOTE  Date of Service: .04/11/2016  Patient Care Team: [name], MD as PCP - General (Family Medicine)  CHIEF COMPLAINTS/PURPOSE OF CONSULTATION:  Follow up for multiple myeloma  DIAGNOSIS  IgA lambda R-ISS stage II multiple myeloma with innumerable lytic lesions in the calvarium, lesion in T7 vertebra and multiple lesions in the pelvis.  No significant bone pain at this time.  Current treatment  Status post Vd x 1 cycle VRd x 6 cycles  HD Melphalan 284m/m2 on 03/22/2016 Autologous HSCT on 03/23/2016 [facility](Dose of 6.4 x10^6/kg)  Prophylactic medications - Acyclovir 400 mg oral twice daily. -now of Ciprofloxacin and Fluconazole.  HISTORY OF PRESENTING ILLNESS: Please see my initial consultation for details of her initial presentation.  INTERVAL HISTORY  [name] here for her scheduled followup for MM . [facility] and has done exceptionally well post transplant with rapid engraftment, no issues with fever or infections and no transfusion requirements. She received G-CSF for early resolution of neutropenia and reports bilateral shoulder and hip pains as a result of the medication which have now nearly resolved. She notes some mild but improving fatigue.  No other new focal symptoms. She is following up with [place] lab [place] and has a followup with Dr. GAlvie Heidelbergin early August 2017.  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 We discussed that this would not be a good idea and that her employer showed tried to provide her alternative work options if possible.   MEDICAL HISTORY:  Past Medical History  Diagnosis Date  . Prediabetes   . Fibroid     reason for Hysterectomy  . Urinary incontinence   . PONV (postoperative nausea and vomiting)     thinks morphine caused nausea   . Pain aggravated by activities of daily living     states she pulled something at right sternum in dec 2016  . [place] ([place]    squamous cell carcinoma of thymus  . Multiple myeloma ([place]   . Multiple myeloma ([place]     SURGICAL HISTORY: Past Surgical History  Procedure Laterality Date  . Thymectomy  1999  . Abdominal hysterectomy  1997    TAH--ovaries remain--Dr. TNewton Pigg   SOCIAL HISTORY: Social History   Social History  . Marital Status: Widowed    Spouse Name: N/A  . Number of Children: N/A  . Years of Education: N/A   Occupational History  . Not on file.   Social History Main Topics  . Smoking status: Never Smoker   . Smokeless tobacco: Never Used  . Alcohol Use: No  . Drug Use: No  . Sexual Activity:    Partners: Male    Birth Control/ Protection: Surgical     Comment: TAH--ovaries remain   Other Topics Concern  . Not on file   Social History Narrative    FAMILY HISTORY: Family History  Problem Relation Age of Onset  . COPD Father     dec age 13/s-smoked  . Diabetes Brother   . Other Brother     committed suicide age 62 . Hyperlipidemia Mother   . Diabetes Maternal Grandmother     ALLERGIES:  is allergic to penicillins and ampicillin.  MEDICATIONS:  Current Outpatient Prescriptions  Medication Sig Dispense Refill  . acyclovir (ZOVIRAX) 400 MG tablet  Take 1 tablet (400 mg total) by mouth 2 (two) times daily. 60 tablet 2  . aspirin EC 325 MG tablet Take 1 tablet (325 mg total) by mouth daily. 60 tablet 3  . cholecalciferol (VITAMIN D) 1000 units tablet Take 2,000 Units by mouth daily.    Paula Johnson Kitchen dexamethasone (DECADRON) 4 MG tablet Take 10 tablets (40 mg) on days 1, 8, and 15 of chemo. Repeat every 21 days. 30 tablet 3  . LORazepam (ATIVAN) 0.5 MG tablet Take 1 tablet (0.5 mg total) by mouth every 6 (six) hours as needed (Nausea or vomiting). 30 tablet 0  . Lubricants (SURGICAL LUBRICANT) gel Apply topically as needed. Prescription is to be  bacteriostatic.Box 120.49 grams. 60 g 1  . NEUPOGEN 300 MCG/0.5ML SOSY injection   0  . ondansetron (ZOFRAN) 8 MG tablet Take 1 tablet (8 mg total) by mouth 2 (two) times daily as needed (Nausea or vomiting). 30 tablet 1  . prochlorperazine (COMPAZINE) 10 MG tablet Take 1 tablet (10 mg total) by mouth every 6 (six) hours as needed (Nausea or vomiting). 30 tablet 1  . senna-docusate (SENNA S) 8.6-50 MG tablet Take 2 tablets by mouth at bedtime. (Patient taking differently: Take 2 tablets by mouth 2 (two) times daily at 8 am and 10 pm. ) 60 tablet 1   No current facility-administered medications for this visit.    REVIEW OF SYSTEMS:    10 Point review of Systems was done is negative except as noted above.  PHYSICAL EXAMINATION: ECOG PERFORMANCE STATUS: 1 - Symptomatic but completely ambulatory  . Filed Vitals:   04/11/16 1301  BP: 105/63  Pulse: 108  Temp: 98.3 F (36.8 C)  Resp: 18   Filed Weights   04/11/16 1301  Weight: 125 lb 6.4 oz (56.881 kg)   .Body mass index is 19.64 kg/(m^2).  GENERAL:alert, in no acute distress and comfortable SKIN: skin color, texture, turgor are normal, no rashes or significant lesions EYES: normal, conjunctiva are pink and non-injected, sclera clear OROPHARYNX:no exudate, no erythema and lips, buccal mucosa, and tongue normal  NECK: supple, no JVD, thyroid normal size, non-tender, without nodularity LYMPH:  no palpable lymphadenopathy in the cervical, axillary or inguinal LUNGS: clear to auscultation with normal respiratory effort HEART: regular rate & rhythm,  no murmurs and no lower extremity edema ABDOMEN: abdomen soft, non-tender, normoactive bowel sounds  Musculoskeletal: no cyanosis of digits and no clubbing  PSYCH: alert & oriented x 3 with fluent speech NEURO: no focal motor/sensory deficits  LABORATORY DATA:  I have reviewed the data as listed . CBC Latest Ref Rng 04/11/2016 02/15/2016 02/08/2016  WBC 3.9 - 10.3 10e3/uL 4.7 4.8 4.6    Hemoglobin 11.6 - 15.9 g/dL 11.4(L) 12.4 12.4  Hematocrit 34.8 - 46.6 % 33.4(L) 38.1 38.2  Platelets 145 - 400 10e3/uL 188 284 368   . CBC    Component Value Date/Time   WBC 4.7 04/11/2016 1233   WBC 2.3* 01/28/2016 0720   RBC 3.76 04/11/2016 1233   RBC 3.79* 01/28/2016 0720   HGB 11.4* 04/11/2016 1233   HGB 11.6* 01/28/2016 0720   HCT 33.4* 04/11/2016 1233   HCT 36.0 01/28/2016 0720   PLT 188 04/11/2016 1233   PLT 206 01/28/2016 0720   MCV 88.8 04/11/2016 1233   MCV 95.0 01/28/2016 0720   MCH 30.3 04/11/2016 1233   MCH 30.6 01/28/2016 0720   MCHC 34.1 04/11/2016 1233   MCHC 32.2 01/28/2016 0720   RDW 14.7* 04/11/2016 1233  RDW 16.4* 01/28/2016 0720   LYMPHSABS 1.1 04/11/2016 1233   LYMPHSABS 0.7 01/28/2016 0720   MONOABS 0.8 04/11/2016 1233   MONOABS 0.3 01/28/2016 0720   EOSABS 0.0 04/11/2016 1233   EOSABS 0.2 01/28/2016 0720   BASOSABS 0.0 04/11/2016 1233   BASOSABS 0.0 01/28/2016 0720     . CMP Latest Ref Rng 04/11/2016 02/15/2016 02/08/2016  Glucose 70 - 140 mg/dl 105 148(H) 132  BUN 7.0 - 26.0 mg/dL 17.7 17.6 20.4  Creatinine 0.6 - 1.1 mg/dL 0.7 0.8 0.8  Sodium 136 - 145 mEq/L 142 139 142  Potassium 3.5 - 5.1 mEq/L 4.1 4.2 4.2  CO2 22 - 29 mEq/L 28 24 25   Calcium 8.4 - 10.4 mg/dL 9.6 9.5 10.0  Total Protein 6.4 - 8.3 g/dL 6.7 7.1 7.8  Total Bilirubin 0.20 - 1.20 mg/dL <0.30 0.56 0.66  Alkaline Phos 40 - 150 U/L 72 84 84  AST 5 - 34 U/L 18 14 19   ALT 0 - 55 U/L 25 17 20     RADIOGRAPHIC STUDIES: I have personally reviewed the radiological images as listed and agreed with the findings in the report. No results found.  ASSESSMENT & PLAN:    Very pleasant 60 year old Caucasian female with  1) R-ISS Stage II IgA lambda multiple myeloma with standard risk cytogenetics t (11;14). PET/CT shows evidence of lytic bone lesions in the calvarium, iliac bones  and T7. No hypercalcemia or renal failure at this time. Pretreatment M spike of 5 in 2 monoclonal  bands. BM bx with 69% plasma cells with Lambda light chain restriction Patient is status post cycle 1 of Vd and 5 cycles of VRd and has she tolerated well other than some grade 1 rash. Also noted to have fevers from Velcade which are controlled with naproxen.   S/p 6 cycles of VRd S[facility] 267[facility]  Mild dyserythropoiesis and dysmegakaryopoiesis noted on pre transplant marrow - likely treatment effect.  #2 anemia due to multiple myeloma and treatment medications- resolved #3 fatigue due to multiple myeloma and Anemia - resolved #4 multiple bone metastases from multiple myeloma. - Completed Zometa x 5 doses . Not required currently. #5 hip and shoulder pains related to use of G-CSF- resolving.  Patient now off G-CSF. Plan --Patient has successfully engrafted after her recent high-dose melphalan [facility]. She has not had any fever or infectious complications. Has not had any transfusion requirements at this time. -She is currently off prophylactic ciprofloxacin and fluconazole. -she will continue her prophylactic acyclovir for at least one year post transplant. -She is not currently on steroids. -We will plan to give him weekly CBC with differential. -Transfuse only irradiated, leukocyte reduced and CMV negative blood products. PRBC for hemoglobin less than 8 and platelets for platelet count of less than 10k. -Might need G-CSF support if ANC less than 500. Not indicated at this time. -Resolving fatigue. -Patient was recommended to call u[place] her transplanting immediately for any fevers. -will need flu vaccination in the season. -for fevers and respiratory infections will need an extended viral panel for evaluation. -for any shortness of breath fevers chills or cough patient will need to get PFT with DLCO -counseled on infection prevention strategies. -Continue follow up with Dr. GSamule Ohmas per  her f/u plan. Next visit early August 2017.   [facility]   RTC with Dr KIrene Limboin 2  months. RTC for weekly labs as per recommendation of [facility] transplant team.  I spent 40 minutes counseling the patient face to face. The total time spent in the appointment was 40 minutes and more than 50% was on counseling and direct patient cares.    Paula Lone MD [place] AAHIVMS [facility] Hematology/Oncology Physician [facility]  (Office):       [phone] (Work cell):  [phone] (Fax):           [phone]

## 2016-04-18 ENCOUNTER — Other Ambulatory Visit (HOSPITAL_BASED_OUTPATIENT_CLINIC_OR_DEPARTMENT_OTHER): Payer: BLUE CROSS/BLUE SHIELD

## 2016-04-18 DIAGNOSIS — C9000 Multiple myeloma not having achieved remission: Secondary | ICD-10-CM

## 2016-04-18 LAB — CBC & DIFF AND RETIC
BASO%: 1 % (ref 0.0–2.0)
BASOS ABS: 0.1 10*3/uL (ref 0.0–0.1)
EOS ABS: 0.4 10*3/uL (ref 0.0–0.5)
EOS%: 8.6 % — ABNORMAL HIGH (ref 0.0–7.0)
HEMATOCRIT: 33.7 % — AB (ref 34.8–46.6)
HEMOGLOBIN: 11.5 g/dL — AB (ref 11.6–15.9)
IMMATURE RETIC FRACT: 1.6 % (ref 1.60–10.00)
LYMPH%: 26.1 % (ref 14.0–49.7)
MCH: 30.4 pg (ref 25.1–34.0)
MCHC: 34.1 g/dL (ref 31.5–36.0)
MCV: 89.2 fL (ref 79.5–101.0)
MONO#: 1 10*3/uL — AB (ref 0.1–0.9)
MONO%: 20.4 % — ABNORMAL HIGH (ref 0.0–14.0)
NEUT%: 43.9 % (ref 38.4–76.8)
NEUTROS ABS: 2.2 10*3/uL (ref 1.5–6.5)
Platelets: 248 10*3/uL (ref 145–400)
RBC: 3.78 10*6/uL (ref 3.70–5.45)
RDW: 15.1 % — AB (ref 11.2–14.5)
RETIC %: 0.97 % (ref 0.70–2.10)
Retic Ct Abs: 36.67 10*3/uL (ref 33.70–90.70)
WBC: 4.9 10*3/uL (ref 3.9–10.3)
lymph#: 1.3 10*3/uL (ref 0.9–3.3)

## 2016-04-18 LAB — COMPREHENSIVE METABOLIC PANEL
ALBUMIN: 3.5 g/dL (ref 3.5–5.0)
ALK PHOS: 70 U/L (ref 40–150)
ALT: 27 U/L (ref 0–55)
AST: 23 U/L (ref 5–34)
Anion Gap: 8 mEq/L (ref 3–11)
BUN: 18.6 mg/dL (ref 7.0–26.0)
CO2: 23 mEq/L (ref 22–29)
CREATININE: 0.7 mg/dL (ref 0.6–1.1)
Calcium: 8.9 mg/dL (ref 8.4–10.4)
Chloride: 108 mEq/L (ref 98–109)
EGFR: >90 mL/min/{1.73_m2} (ref >90)
GLUCOSE: 88 mg/dL (ref 70–140)
Potassium: 3.9 mEq/L (ref 3.5–5.1)
SODIUM: 139 meq/L (ref 136–145)
TOTAL PROTEIN: 6.7 g/dL (ref 6.4–8.3)

## 2016-04-25 ENCOUNTER — Other Ambulatory Visit (HOSPITAL_BASED_OUTPATIENT_CLINIC_OR_DEPARTMENT_OTHER): Payer: BLUE CROSS/BLUE SHIELD

## 2016-04-25 DIAGNOSIS — C9000 Multiple myeloma not having achieved remission: Secondary | ICD-10-CM | POA: Diagnosis not present

## 2016-04-25 LAB — CBC & DIFF AND RETIC
BASO%: 0.4 % (ref 0.0–2.0)
Basophils Absolute: 0 10*3/uL (ref 0.0–0.1)
EOS ABS: 0.5 10*3/uL (ref 0.0–0.5)
EOS%: 6.3 % (ref 0.0–7.0)
HCT: 35.9 % (ref 34.8–46.6)
HEMOGLOBIN: 12.1 g/dL (ref 11.6–15.9)
IMMATURE RETIC FRACT: 2.1 % (ref 1.60–10.00)
LYMPH%: 20.7 % (ref 14.0–49.7)
MCH: 30.6 pg (ref 25.1–34.0)
MCHC: 33.7 g/dL (ref 31.5–36.0)
MCV: 90.7 fL (ref 79.5–101.0)
MONO#: 0.7 10*3/uL (ref 0.1–0.9)
MONO%: 9.6 % (ref 0.0–14.0)
NEUT%: 63 % (ref 38.4–76.8)
NEUTROS ABS: 4.9 10*3/uL (ref 1.5–6.5)
Platelets: 306 10*3/uL (ref 145–400)
RBC: 3.96 10*6/uL (ref 3.70–5.45)
RDW: 15.5 % — AB (ref 11.2–14.5)
RETIC %: 1.16 % (ref 0.70–2.10)
Retic Ct Abs: 45.94 10*3/uL (ref 33.70–90.70)
WBC: 7.7 10*3/uL (ref 3.9–10.3)
lymph#: 1.6 10*3/uL (ref 0.9–3.3)

## 2016-04-25 LAB — COMPREHENSIVE METABOLIC PANEL
ALBUMIN: 3.8 g/dL (ref 3.5–5.0)
ALT: 36 U/L (ref 0–55)
AST: 24 U/L (ref 5–34)
Alkaline Phosphatase: 70 U/L (ref 40–150)
Anion Gap: 7 mEq/L (ref 3–11)
BUN: 14.1 mg/dL (ref 7.0–26.0)
CO2: 25 meq/L (ref 22–29)
Calcium: 9 mg/dL (ref 8.4–10.4)
Chloride: 109 mEq/L (ref 98–109)
Creatinine: 0.8 mg/dL (ref 0.6–1.1)
EGFR: 84 mL/min/{1.73_m2} — AB (ref >90)
GLUCOSE: 138 mg/dL (ref 70–140)
POTASSIUM: 3.6 meq/L (ref 3.5–5.1)
SODIUM: 141 meq/L (ref 136–145)
Total Bilirubin: 0.44 mg/dL (ref 0.20–1.20)
Total Protein: 7.2 g/dL (ref 6.4–8.3)

## 2016-05-02 ENCOUNTER — Other Ambulatory Visit (HOSPITAL_BASED_OUTPATIENT_CLINIC_OR_DEPARTMENT_OTHER): Payer: BLUE CROSS/BLUE SHIELD

## 2016-05-02 DIAGNOSIS — C9000 Multiple myeloma not having achieved remission: Secondary | ICD-10-CM

## 2016-05-02 LAB — CBC & DIFF AND RETIC
BASO%: 0.2 % (ref 0.0–2.0)
Basophils Absolute: 0 10*3/uL (ref 0.0–0.1)
EOS%: 5.3 % (ref 0.0–7.0)
Eosinophils Absolute: 0.5 10*3/uL (ref 0.0–0.5)
HCT: 35.2 % (ref 34.8–46.6)
HGB: 12 g/dL (ref 11.6–15.9)
IMMATURE RETIC FRACT: 3 % (ref 1.60–10.00)
LYMPH%: 14.6 % (ref 14.0–49.7)
MCH: 30.8 pg (ref 25.1–34.0)
MCHC: 34.1 g/dL (ref 31.5–36.0)
MCV: 90.5 fL (ref 79.5–101.0)
MONO#: 0.8 10*3/uL (ref 0.1–0.9)
MONO%: 8.1 % (ref 0.0–14.0)
NEUT#: 6.7 10*3/uL — ABNORMAL HIGH (ref 1.5–6.5)
NEUT%: 71.8 % (ref 38.4–76.8)
PLATELETS: 314 10*3/uL (ref 145–400)
RBC: 3.89 10*6/uL (ref 3.70–5.45)
RDW: 15.6 % — ABNORMAL HIGH (ref 11.2–14.5)
Retic %: 1.35 % (ref 0.70–2.10)
Retic Ct Abs: 52.52 10*3/uL (ref 33.70–90.70)
WBC: 9.4 10*3/uL (ref 3.9–10.3)
lymph#: 1.4 10*3/uL (ref 0.9–3.3)

## 2016-05-04 ENCOUNTER — Other Ambulatory Visit: Payer: Self-pay | Admitting: Nurse Practitioner

## 2016-05-08 ENCOUNTER — Other Ambulatory Visit (HOSPITAL_BASED_OUTPATIENT_CLINIC_OR_DEPARTMENT_OTHER): Payer: BLUE CROSS/BLUE SHIELD

## 2016-05-08 DIAGNOSIS — C9000 Multiple myeloma not having achieved remission: Secondary | ICD-10-CM | POA: Diagnosis not present

## 2016-05-08 LAB — COMPREHENSIVE METABOLIC PANEL
ALT: 13 U/L (ref 0–55)
ANION GAP: 7 meq/L (ref 3–11)
AST: 18 U/L (ref 5–34)
Albumin: 3.6 g/dL (ref 3.5–5.0)
Alkaline Phosphatase: 65 U/L (ref 40–150)
BUN: 15.1 mg/dL (ref 7.0–26.0)
CHLORIDE: 109 meq/L (ref 98–109)
CO2: 26 meq/L (ref 22–29)
CREATININE: 0.8 mg/dL (ref 0.6–1.1)
Calcium: 9.9 mg/dL (ref 8.4–10.4)
EGFR: 79 mL/min/{1.73_m2} — AB (ref >90)
Glucose: 93 mg/dl (ref 70–140)
POTASSIUM: 4.2 meq/L (ref 3.5–5.1)
Sodium: 142 mEq/L (ref 136–145)
Total Bilirubin: 0.55 mg/dL (ref 0.20–1.20)
Total Protein: 6.9 g/dL (ref 6.4–8.3)

## 2016-05-08 LAB — CBC & DIFF AND RETIC
BASO%: 0.3 % (ref 0.0–2.0)
Basophils Absolute: 0 10*3/uL (ref 0.0–0.1)
EOS%: 4.8 % (ref 0.0–7.0)
Eosinophils Absolute: 0.4 10*3/uL (ref 0.0–0.5)
HCT: 35.2 % (ref 34.8–46.6)
HGB: 11.8 g/dL (ref 11.6–15.9)
IMMATURE RETIC FRACT: 2.3 % (ref 1.60–10.00)
LYMPH#: 1.6 10*3/uL (ref 0.9–3.3)
LYMPH%: 17.5 % (ref 14.0–49.7)
MCH: 30.7 pg (ref 25.1–34.0)
MCHC: 33.5 g/dL (ref 31.5–36.0)
MCV: 91.7 fL (ref 79.5–101.0)
MONO#: 0.7 10*3/uL (ref 0.1–0.9)
MONO%: 7.3 % (ref 0.0–14.0)
NEUT%: 70.1 % (ref 38.4–76.8)
NEUTROS ABS: 6.3 10*3/uL (ref 1.5–6.5)
PLATELETS: 299 10*3/uL (ref 145–400)
RBC: 3.84 10*6/uL (ref 3.70–5.45)
RDW: 15.9 % — AB (ref 11.2–14.5)
RETIC %: 1.31 % (ref 0.70–2.10)
RETIC CT ABS: 50.3 10*3/uL (ref 33.70–90.70)
WBC: 9 10*3/uL (ref 3.9–10.3)

## 2016-05-09 ENCOUNTER — Other Ambulatory Visit: Payer: Self-pay

## 2016-05-15 ENCOUNTER — Telehealth: Payer: Self-pay | Admitting: Hematology

## 2016-05-15 NOTE — Telephone Encounter (Signed)
05/25/2016 Appointment rescheduled to 05/23/2016 per patient request. Scheduling conflict with original appointment date.

## 2016-05-16 ENCOUNTER — Other Ambulatory Visit (HOSPITAL_BASED_OUTPATIENT_CLINIC_OR_DEPARTMENT_OTHER): Payer: BLUE CROSS/BLUE SHIELD

## 2016-05-16 DIAGNOSIS — C9000 Multiple myeloma not having achieved remission: Secondary | ICD-10-CM

## 2016-05-16 LAB — CBC & DIFF AND RETIC
BASO%: 0.3 % (ref 0.0–2.0)
Basophils Absolute: 0 10*3/uL (ref 0.0–0.1)
EOS ABS: 0.4 10*3/uL (ref 0.0–0.5)
EOS%: 5.3 % (ref 0.0–7.0)
HCT: 35.1 % (ref 34.8–46.6)
HEMOGLOBIN: 11.8 g/dL (ref 11.6–15.9)
IMMATURE RETIC FRACT: 0.7 % — AB (ref 1.60–10.00)
LYMPH%: 19.8 % (ref 14.0–49.7)
MCH: 30.6 pg (ref 25.1–34.0)
MCHC: 33.6 g/dL (ref 31.5–36.0)
MCV: 91.2 fL (ref 79.5–101.0)
MONO#: 0.7 10*3/uL (ref 0.1–0.9)
MONO%: 9.4 % (ref 0.0–14.0)
NEUT%: 65.2 % (ref 38.4–76.8)
NEUTROS ABS: 4.6 10*3/uL (ref 1.5–6.5)
PLATELETS: 306 10*3/uL (ref 145–400)
RBC: 3.85 10*6/uL (ref 3.70–5.45)
RDW: 15.6 % — ABNORMAL HIGH (ref 11.2–14.5)
Retic %: 1.3 % (ref 0.70–2.10)
Retic Ct Abs: 50.05 10*3/uL (ref 33.70–90.70)
WBC: 7.1 10*3/uL (ref 3.9–10.3)
lymph#: 1.4 10*3/uL (ref 0.9–3.3)

## 2016-05-23 ENCOUNTER — Ambulatory Visit (HOSPITAL_BASED_OUTPATIENT_CLINIC_OR_DEPARTMENT_OTHER): Payer: BLUE CROSS/BLUE SHIELD | Admitting: Hematology

## 2016-05-23 ENCOUNTER — Other Ambulatory Visit (HOSPITAL_BASED_OUTPATIENT_CLINIC_OR_DEPARTMENT_OTHER): Payer: BLUE CROSS/BLUE SHIELD

## 2016-05-23 ENCOUNTER — Encounter: Payer: Self-pay | Admitting: Hematology

## 2016-05-23 VITALS — BP 115/69 | HR 98 | Temp 98.0°F | Resp 16 | Ht 67.0 in | Wt 127.5 lb

## 2016-05-23 DIAGNOSIS — C9000 Multiple myeloma not having achieved remission: Secondary | ICD-10-CM | POA: Diagnosis not present

## 2016-05-23 DIAGNOSIS — Z9481 Bone marrow transplant status: Secondary | ICD-10-CM

## 2016-05-23 DIAGNOSIS — C9001 Multiple myeloma in remission: Secondary | ICD-10-CM

## 2016-05-23 LAB — CBC & DIFF AND RETIC
BASO%: 0.5 % (ref 0.0–2.0)
BASOS ABS: 0 10*3/uL (ref 0.0–0.1)
EOS%: 5.5 % (ref 0.0–7.0)
Eosinophils Absolute: 0.3 10*3/uL (ref 0.0–0.5)
HCT: 36.3 % (ref 34.8–46.6)
HGB: 12.3 g/dL (ref 11.6–15.9)
Immature Retic Fract: 2 % (ref 1.60–10.00)
LYMPH#: 1.5 10*3/uL (ref 0.9–3.3)
LYMPH%: 25.8 % (ref 14.0–49.7)
MCH: 30.8 pg (ref 25.1–34.0)
MCHC: 33.9 g/dL (ref 31.5–36.0)
MCV: 91 fL (ref 79.5–101.0)
MONO#: 0.6 10*3/uL (ref 0.1–0.9)
MONO%: 9.8 % (ref 0.0–14.0)
NEUT%: 58.4 % (ref 38.4–76.8)
NEUTROS ABS: 3.4 10*3/uL (ref 1.5–6.5)
Platelets: 298 10*3/uL (ref 145–400)
RBC: 3.99 10*6/uL (ref 3.70–5.45)
RDW: 15.1 % — AB (ref 11.2–14.5)
RETIC %: 1.43 % (ref 0.70–2.10)
RETIC CT ABS: 57.06 10*3/uL (ref 33.70–90.70)
WBC: 5.8 10*3/uL (ref 3.9–10.3)

## 2016-05-25 ENCOUNTER — Ambulatory Visit: Payer: Self-pay | Admitting: Hematology

## 2016-05-25 ENCOUNTER — Other Ambulatory Visit: Payer: Self-pay | Admitting: Hematology

## 2016-05-25 ENCOUNTER — Other Ambulatory Visit: Payer: Self-pay

## 2016-05-25 DIAGNOSIS — C9000 Multiple myeloma not having achieved remission: Secondary | ICD-10-CM

## 2016-05-26 ENCOUNTER — Other Ambulatory Visit: Payer: Self-pay | Admitting: *Deleted

## 2016-05-26 DIAGNOSIS — C9000 Multiple myeloma not having achieved remission: Secondary | ICD-10-CM

## 2016-05-26 MED ORDER — ACYCLOVIR 400 MG PO TABS: 400.0000 mg | ORAL_TABLET | Freq: Two times a day (BID) | ORAL | 2 refills | Status: DC

## 2016-05-28 ENCOUNTER — Telehealth: Payer: Self-pay | Admitting: Hematology

## 2016-05-28 NOTE — Telephone Encounter (Signed)
SPOKE WITH PATIENT RE NEW APPOINTMENTS FOR 9/19 AND 10/10. PATIENT AWARE 9/5 AND 9/12 CXD.

## 2016-05-30 ENCOUNTER — Other Ambulatory Visit: Payer: Self-pay

## 2016-06-05 ENCOUNTER — Telehealth: Payer: Self-pay | Admitting: *Deleted

## 2016-06-05 NOTE — Telephone Encounter (Signed)
FYI "Hinton Dyer with [employer] calling to check ob status of record request we faxed 05-24-2016 and again on 05-26-2016 to [phone]."  Provided fax number [phone].

## 2016-06-06 ENCOUNTER — Other Ambulatory Visit: Payer: Self-pay

## 2016-06-06 ENCOUNTER — Telehealth: Payer: Self-pay | Admitting: Hematology

## 2016-06-06 ENCOUNTER — Ambulatory Visit: Payer: Self-pay | Admitting: Hematology

## 2016-06-06 NOTE — Telephone Encounter (Signed)
06/13/2016 Appointment canceled per patient request. The patient has a prior appointment with [facility] and they will draw labs so she didn't want to keep the appointment. 07/04/2016 Appointment time changed per patient request. Patient preferred a late afternoon appointment.

## 2016-06-09 NOTE — Telephone Encounter (Signed)
Paula Johnson called again asking about status of their request for records. They will fax the records request again today.

## 2016-06-12 NOTE — Progress Notes (Signed)
Marland Kitchen    HEMATOLOGY/ONCOLOGY CLINIC NOTE  Date of Service: .05/23/2016  Patient Care Team: [name], MD as PCP - General (Family Medicine)  CHIEF COMPLAINTS/PURPOSE OF CONSULTATION:  Follow up for multiple myeloma  DIAGNOSIS  IgA lambda R-ISS stage II multiple myeloma with innumerable lytic lesions in the calvarium, lesion in T7 vertebra and multiple lesions in the pelvis.  No significant bone pain at this time.  Current treatment  S/p HSCT transplant at [facility] on 03/23/2016  Previous Treatment  Status post Vd x 1 cycle VRd x 6 cycles  HD Melphalan 24m/m2 on 03/22/2016 Autologous HSCT [facility] 03/23/2016 [facility](Dose of 6.4 x10^6/kg)  Prophylactic medications - Acyclovir 400 mg oral twice daily. -now of Ciprofloxacin and Fluconazole.  HISTORY OF PRESENTING ILLNESS: Please see my initial consultation for details of her initial presentation.  INTERVAL HISTORY  Paula Johnson here for her scheduled followup for MM . [facility] and is now out about 60 days from her transplant. No other acute new symptoms at this time. No fevers or chills. [facility] She is here for follow-up today for lab checks. She continues to be on acyclovir for VZV prophylaxis.   MEDICAL HISTORY:  Past Medical History:  Diagnosis Date  . [place]   squamous cell carcinoma of thymus  . Fibroid    reason for Hysterectomy  . Multiple myeloma ([place]   . Multiple myeloma ([place]   . Pain aggravated by activities of daily living    states she pulled something at right sternum in dec 2016  . PONV (postoperative nausea and vomiting)    thinks morphine caused nausea  . Prediabetes   . Urinary incontinence     SURGICAL HISTORY: Past Surgical History:  Procedure Laterality Date  . ABDOMINAL HYSTERECTOMY  1997   TAH--ovaries remain--Dr. TNewton Pigg . THYMECTOMY  1999    SOCIAL  HISTORY: Social History   Social History  . Marital status: Widowed    Spouse name: N/A  . Number of children: N/A  . Years of education: N/A   Occupational History  . Not on file.   Social History Main Topics  . Smoking status: Never Smoker  . Smokeless tobacco: Never Used  . Alcohol use No  . Drug use: No  . Sexual activity: Yes    Partners: Male    Birth control/ protection: Surgical     Comment: TAH--ovaries remain   Other Topics Concern  . Not on file   Social History Narrative  . No narrative on file    FAMILY HISTORY: Family History  Problem Relation Age of Onset  . COPD Father     dec age 488/s-smoked  . Diabetes Brother   . Other Brother     committed suicide age 62 . Hyperlipidemia Mother   . Diabetes Maternal Grandmother     ALLERGIES:  is allergic to penicillins and ampicillin.  MEDICATIONS:  Current Outpatient Prescriptions  Medication Sig Dispense Refill  . cholecalciferol (VITAMIN D) 1000 units tablet Take 2,000 Units by mouth daily.    .Marland Kitchensenna-docusate (SENNA S) 8.6-50 MG tablet Take 2 tablets by mouth at bedtime. (Patient taking differently: Take 2 tablets by mouth 2 (two) times daily at 8 am and 10 pm. ) 60 tablet 1  . acyclovir (ZOVIRAX) 400 MG tablet Take 1 tablet (400 mg total) by mouth 2 (  two) times daily. 60 tablet 2   No current facility-administered medications for this visit.     REVIEW OF SYSTEMS:    10 Point review of Systems was done is negative except as noted above.  PHYSICAL EXAMINATION: ECOG PERFORMANCE STATUS: 1 - Symptomatic but completely ambulatory  . Vitals:   05/23/16 1610  BP: 115/69  Pulse: 98  Resp: 16  Temp: 98 F (36.7 C)   Filed Weights   05/23/16 1610  Weight: 127 lb 8 oz (57.8 kg)   .Body mass index is 19.97 kg/m.  GENERAL:alert, in no acute distress and comfortable SKIN: skin color, texture, turgor are normal, no rashes or significant lesions EYES: normal, conjunctiva are pink and  non-injected, sclera clear OROPHARYNX:no exudate, no erythema and lips, buccal mucosa, and tongue normal  NECK: supple, no JVD, thyroid normal size, non-tender, without nodularity LYMPH:  no palpable lymphadenopathy in the cervical, axillary or inguinal LUNGS: clear to auscultation with normal respiratory effort HEART: regular rate & rhythm,  no murmurs and no lower extremity edema ABDOMEN: abdomen soft, non-tender, normoactive bowel sounds  Musculoskeletal: no cyanosis of digits and no clubbing  PSYCH: alert & oriented x 3 with fluent speech NEURO: no focal motor/sensory deficits  LABORATORY DATA:  I have reviewed the data as listed . CBC Latest Ref Rng & Units 05/23/2016 05/16/2016 05/08/2016  WBC 3.9 - 10.3 10e3/uL 5.8 7.1 9.0  Hemoglobin 11.6 - 15.9 g/dL 12.3 11.8 11.8  Hematocrit 34.8 - 46.6 % 36.3 35.1 35.2  Platelets 145 - 400 10e3/uL 298 306 299   . CBC    Component Value Date/Time   WBC 5.8 05/23/2016 1529   WBC 2.3 (L) 01/28/2016 0720   RBC 3.99 05/23/2016 1529   RBC 3.79 (L) 01/28/2016 0720   HGB 12.3 05/23/2016 1529   HCT 36.3 05/23/2016 1529   PLT 298 05/23/2016 1529   MCV 91.0 05/23/2016 1529   MCH 30.8 05/23/2016 1529   MCH 30.6 01/28/2016 0720   MCHC 33.9 05/23/2016 1529   MCHC 32.2 01/28/2016 0720   RDW 15.1 (H) 05/23/2016 1529   LYMPHSABS 1.5 05/23/2016 1529   MONOABS 0.6 05/23/2016 1529   EOSABS 0.3 05/23/2016 1529   BASOSABS 0.0 05/23/2016 1529     . CMP Latest Ref Rng & Units 05/08/2016 04/25/2016 04/18/2016  Glucose 70 - 140 mg/dl 93 138 88  BUN 7.0 - 26.0 mg/dL 15.1 14.1 18.6  Creatinine 0.6 - 1.1 mg/dL 0.8 0.8 0.7  Sodium 136 - 145 mEq/L 142 141 139  Potassium 3.5 - 5.1 mEq/L 4.2 3.6 3.9  Chloride 101 - 111 mmol/L - - -  CO2 22 - 29 mEq/L 26 25 23   Calcium 8.4 - 10.4 mg/dL 9.9 9.0 8.9  Total Protein 6.4 - 8.3 g/dL 6.9 7.2 6.7  Total Bilirubin 0.20 - 1.20 mg/dL 0.55 0.44 <0.30  Alkaline Phos 40 - 150 U/L 65 70 70  AST 5 - 34 U/L 18 24 23   ALT  0 - 55 U/L 13 36 27           RADIOGRAPHIC STUDIES: I have personally reviewed the radiological images as listed and agreed with the findings in the report. No results found.  ASSESSMENT & PLAN:    Very pleasant 60 year old Caucasian female with  1) R-ISS Stage II IgA lambda multiple myeloma with standard risk cytogenetics t (11;14). PET/CT shows evidence of lytic bone lesions in the calvarium, iliac bones  and T7. No hypercalcemia or renal failure at this  time. Pretreatment M spike of 5 in 2 monoclonal bands. BM bx with 69% plasma cells with Lambda light chain restriction Patient is status post cycle 1 of Vd and 5 cycles of VRd and has she tolerated well other than some grade 1 rash. Also noted to have fevers from Velcade which are controlled with naproxen.   S/p 6 cycles of VRd S/p [facility] 236m[facility] Patient is about Day 60 after Auto HSCT. [facility] spike of 0.07 with IFE IgG kappa band and very faint IgA lambda  #2 anemia due to multiple myeloma and treatment medications- resolved #3 fatigue due to multiple myeloma and Anemia - resolved #4 multiple bone metastases from multiple myeloma. - Completed Zometa x 5 doses . Not required currently. #5 hip and shoulder pains related to use of G-CSF- resolving.  Patient now off G-CSF. Plan - Patient is doing well with stable labs. -Continue acyclovir for recently prophylaxis -She has follow-up with Dr GSamule Ohmaround the end of September 201 to discuss maintenance treatment options -Will defer decision regarding the need for day 100 bone marrow to Dr GSamule Ohm-will need flu vaccination in the season. -for fevers and respiratory infections will need an extended viral panel for evaluation. -for any shortness of breath fevers chills or cough patient will need to get PFT with DLCO -counseled on infection prevention strategies. -Patient has been counseled  to avoid working in any capacity that would involve significant exposure to infectious agents due to high risk of infections for atleast the first 2 yrs posttransplant. -Patient requested a FMLA paperwork   RTC with Dr KIrene Limboin 6-8 weeks with labs. Continue labs every 3 weeks to monitor counts .  I spent 25 minutes counseling the patient face to face. The total time spent in the appointment was 25 minutes and more than 50% was on counseling and direct patient cares.    GSullivan LoneMD MLone JackAAHIVMS SStra[facility] SurgeryCM[facility] And Health Care CenterHematology/Oncology Physician [facility] (Office):       [phone](Work cell):  [phone](Fax):           [phone]

## 2016-06-13 ENCOUNTER — Other Ambulatory Visit: Payer: Self-pay

## 2016-06-15 ENCOUNTER — Telehealth: Payer: Self-pay

## 2016-06-15 NOTE — Telephone Encounter (Signed)
Faxed fmla paperwork to [facility] 06/12/16

## 2016-06-19 ENCOUNTER — Other Ambulatory Visit: Payer: Self-pay | Admitting: Obstetrics and Gynecology

## 2016-06-19 DIAGNOSIS — Z1231 Encounter for screening mammogram for malignant neoplasm of breast: Secondary | ICD-10-CM

## 2016-06-22 ENCOUNTER — Other Ambulatory Visit: Payer: Self-pay | Admitting: Hematology

## 2016-06-22 MED ORDER — LENALIDOMIDE 10 MG PO CAPS: 10.0000 mg | ORAL_CAPSULE | Freq: Every day | ORAL | 2 refills | Status: DC

## 2016-06-22 NOTE — Progress Notes (Deleted)
re

## 2016-06-23 ENCOUNTER — Telehealth: Payer: Self-pay | Admitting: *Deleted

## 2016-06-23 ENCOUNTER — Other Ambulatory Visit: Payer: Self-pay | Admitting: *Deleted

## 2016-06-23 ENCOUNTER — Encounter: Payer: Self-pay | Admitting: Pharmacist

## 2016-06-23 ENCOUNTER — Ambulatory Visit
Admission: RE | Admit: 2016-06-23 | Discharge: 2016-06-23 | Disposition: A | Discharge status: Home / Self Care | Payer: BLUE CROSS/BLUE SHIELD | Source: Ambulatory Visit | Attending: Obstetrics and Gynecology | Admitting: Obstetrics and Gynecology

## 2016-06-23 DIAGNOSIS — Z1231 Encounter for screening mammogram for malignant neoplasm of breast: Secondary | ICD-10-CM

## 2016-06-23 MED ORDER — LENALIDOMIDE 10 MG PO CAPS: 10.0000 mg | ORAL_CAPSULE | Freq: Every day | ORAL | 2 refills | Status: DC

## 2016-06-23 NOTE — Telephone Encounter (Signed)
FYI "My daughter and granddaughter are coming to visit this weekend.  They both received flu vaccine within the past 24 to 48 hours.  Can I be around them?  I also need to know when I am to receive a flu vaccine.  I had a Bone Marrow Transplant three months ago."  Advised to consult with Bone Marrow Transplant Clinic.  "They're closed." Family has received live vaccine and to avoid family if they have fever or any symptoms of illness.  If they are symptom free, this short weekend visit should be okay.

## 2016-06-23 NOTE — Progress Notes (Signed)
Oral Chemotherapy Pharmacist Encounter New medication was received by Oral Chemo clinic, reviewed for DDI. The prescription for Revlimid has been sent to [facility] for benefit analysis and approval. This is where the patient previously filled Revlimid.  Spoke with patient and provided initial medication education. Counseled patient on administration, dosing, side effects, safe handling, and monitoring. Side effects include but not limited to: N/V/D and rash.  She voiced understanding and appreciation. She has had previous experience with this medication pre-transplant and reported no issues in the past.  Revlimid was written for 10mg  daily. Verified frequency with [name] as it differs slightly from what Dr. Alvie Heidelberg ([place], Care Everywhere) mentioned to the patient. [name] plans to evaluate the patient at the next visit to make sure she is tolerating dosing daily. Made sure patient was clear on current dosing instructions and plan.  Per [name] patient is to also take ASA 81mg  daily while on Revlimid. This was reviewed with the patient and medication was added to her medication list.   All questions answered.  Will follow up with patient regarding insurance and pharmacy. Will follow up in 2 weeks for adherence and toxicity management.   Thank you,  Nuala Alpha, PharmD Oral Chemotherapy Clinic [phone]

## 2016-06-24 ENCOUNTER — Telehealth: Payer: Self-pay

## 2016-06-24 NOTE — Telephone Encounter (Signed)
Faxed disability forms to [facility] 06/21/16

## 2016-06-26 ENCOUNTER — Other Ambulatory Visit: Payer: Self-pay | Admitting: Hematology

## 2016-06-26 DIAGNOSIS — C9000 Multiple myeloma not having achieved remission: Secondary | ICD-10-CM

## 2016-06-27 ENCOUNTER — Other Ambulatory Visit: Payer: Self-pay | Admitting: *Deleted

## 2016-06-27 DIAGNOSIS — C9000 Multiple myeloma not having achieved remission: Secondary | ICD-10-CM

## 2016-06-27 MED ORDER — LENALIDOMIDE 10 MG PO CAPS: 10.0000 mg | ORAL_CAPSULE | Freq: Every day | ORAL | 0 refills | Status: DC

## 2016-06-29 ENCOUNTER — Other Ambulatory Visit: Payer: Self-pay | Admitting: *Deleted

## 2016-06-29 ENCOUNTER — Encounter: Payer: Self-pay | Admitting: *Deleted

## 2016-06-29 DIAGNOSIS — C9000 Multiple myeloma not having achieved remission: Secondary | ICD-10-CM

## 2016-06-29 MED ORDER — ACYCLOVIR 400 MG PO TABS: 400.0000 mg | ORAL_TABLET | Freq: Two times a day (BID) | ORAL | 0 refills | Status: DC

## 2016-06-29 NOTE — Progress Notes (Signed)
RN spoke with patient to confirm shipment of revlimid. Patient states that she has received her medication and started her first dose 06/28/16

## 2016-06-30 ENCOUNTER — Ambulatory Visit
Admission: RE | Admit: 2016-06-30 | Discharge: 2016-06-30 | Disposition: A | Discharge status: Home / Self Care | Payer: BLUE CROSS/BLUE SHIELD | Source: Ambulatory Visit | Attending: Family Medicine | Admitting: Family Medicine

## 2016-06-30 ENCOUNTER — Other Ambulatory Visit: Payer: Self-pay | Admitting: Family Medicine

## 2016-06-30 DIAGNOSIS — M542 Cervicalgia: Secondary | ICD-10-CM

## 2016-07-04 ENCOUNTER — Ambulatory Visit (HOSPITAL_BASED_OUTPATIENT_CLINIC_OR_DEPARTMENT_OTHER): Payer: BLUE CROSS/BLUE SHIELD | Admitting: Hematology

## 2016-07-04 ENCOUNTER — Other Ambulatory Visit (HOSPITAL_BASED_OUTPATIENT_CLINIC_OR_DEPARTMENT_OTHER): Payer: BLUE CROSS/BLUE SHIELD

## 2016-07-04 VITALS — BP 111/68 | HR 103 | Temp 98.3°F | Resp 18 | Wt 132.1 lb

## 2016-07-04 DIAGNOSIS — Z23 Encounter for immunization: Secondary | ICD-10-CM

## 2016-07-04 DIAGNOSIS — C9001 Multiple myeloma in remission: Secondary | ICD-10-CM

## 2016-07-04 DIAGNOSIS — C9000 Multiple myeloma not having achieved remission: Secondary | ICD-10-CM

## 2016-07-04 LAB — CBC & DIFF AND RETIC
BASO%: 0.6 % (ref 0.0–2.0)
Basophils Absolute: 0 10*3/uL (ref 0.0–0.1)
EOS%: 3.6 % (ref 0.0–7.0)
Eosinophils Absolute: 0.2 10*3/uL (ref 0.0–0.5)
HCT: 38.4 % (ref 34.8–46.6)
HGB: 12.9 g/dL (ref 11.6–15.9)
Immature Retic Fract: 2.2 % (ref 1.60–10.00)
LYMPH%: 19.8 % (ref 14.0–49.7)
MCH: 31.3 pg (ref 25.1–34.0)
MCHC: 33.6 g/dL (ref 31.5–36.0)
MCV: 93.2 fL (ref 79.5–101.0)
MONO#: 0.5 10*3/uL (ref 0.1–0.9)
MONO%: 8.6 % (ref 0.0–14.0)
NEUT%: 67.4 % (ref 38.4–76.8)
NEUTROS ABS: 3.5 10*3/uL (ref 1.5–6.5)
Platelets: 251 10*3/uL (ref 145–400)
RBC: 4.12 10*6/uL (ref 3.70–5.45)
RDW: 13.6 % (ref 11.2–14.5)
RETIC %: 1.37 % (ref 0.70–2.10)
Retic Ct Abs: 56.44 10*3/uL (ref 33.70–90.70)
WBC: 5.2 10*3/uL (ref 3.9–10.3)
lymph#: 1 10*3/uL (ref 0.9–3.3)

## 2016-07-04 LAB — COMPREHENSIVE METABOLIC PANEL
ALK PHOS: 77 U/L (ref 40–150)
ALT: 15 U/L (ref 0–55)
ANION GAP: 10 meq/L (ref 3–11)
AST: 18 U/L (ref 5–34)
Albumin: 4 g/dL (ref 3.5–5.0)
BILIRUBIN TOTAL: 0.56 mg/dL (ref 0.20–1.20)
BUN: 15.1 mg/dL (ref 7.0–26.0)
CALCIUM: 9.9 mg/dL (ref 8.4–10.4)
CHLORIDE: 107 meq/L (ref 98–109)
CO2: 26 mEq/L (ref 22–29)
CREATININE: 0.8 mg/dL (ref 0.6–1.1)
EGFR: 80 mL/min/{1.73_m2} — ABNORMAL LOW (ref >90)
Glucose: 111 mg/dl (ref 70–140)
Potassium: 3.8 mEq/L (ref 3.5–5.1)
Sodium: 142 mEq/L (ref 136–145)
Total Protein: 7.4 g/dL (ref 6.4–8.3)

## 2016-07-04 MED ORDER — LENALIDOMIDE 10 MG PO CAPS: 10.0000 mg | ORAL_CAPSULE | Freq: Every day | ORAL | 3 refills | Status: DC

## 2016-07-04 MED ORDER — INFLUENZA VAC SPLIT QUAD 0.5 ML IM SUSY
0.5000 mL | PREFILLED_SYRINGE | Freq: Once | INTRAMUSCULAR | Status: AC
  Administered 2016-07-04: 0.5 mL via INTRAMUSCULAR
  Filled 2016-07-04: qty 0.5

## 2016-07-04 NOTE — Patient Instructions (Signed)

## 2016-07-05 LAB — KAPPA/LAMBDA LIGHT CHAINS
IG KAPPA FREE LIGHT CHAIN: 14.1 mg/L (ref 3.3–19.4)
Ig Lambda Free Light Chain: 18.6 mg/L (ref 5.7–26.3)
KAPPA/LAMBDA FLC RATIO: 0.76 (ref 0.26–1.65)

## 2016-07-07 LAB — MULTIPLE MYELOMA PANEL, SERUM
ALBUMIN/GLOB SERPL: 1.5 (ref 0.7–1.7)
ALPHA 1: 0.2 g/dL (ref 0.0–0.4)
ALPHA2 GLOB SERPL ELPH-MCNC: 0.7 g/dL (ref 0.4–1.0)
Albumin SerPl Elph-Mcnc: 4 g/dL (ref 2.9–4.4)
B-Globulin SerPl Elph-Mcnc: 1.1 g/dL (ref 0.7–1.3)
Gamma Glob SerPl Elph-Mcnc: 0.8 g/dL (ref 0.4–1.8)
Globulin, Total: 2.8 g/dL (ref 2.2–3.9)
IGG (IMMUNOGLOBIN G), SERUM: 828 mg/dL (ref 700–1600)
IGM (IMMUNOGLOBIN M), SRM: 33 mg/dL (ref 26–217)
IgA, Qn, Serum: 175 mg/dL (ref 87–352)
Total Protein: 6.8 g/dL (ref 6.0–8.5)

## 2016-07-10 NOTE — Progress Notes (Signed)
Paula Johnson Kitchen    HEMATOLOGY/ONCOLOGY CLINIC NOTE  Date of Service: .07/04/2016  Patient Care Team: [name], MD as PCP - General (Family Medicine)  CHIEF COMPLAINTS/PURPOSE OF CONSULTATION:  Follow up for multiple myeloma  DIAGNOSIS  IgA lambda R-ISS stage II multiple myeloma with innumerable lytic lesions in the calvarium, lesion in T7 vertebra and multiple lesions in the pelvis.  No significant bone pain at this time.  Current treatment  -Revlimid maintenance 59m po daily -Zometa  Previous Treatment  Status post Vd x 1 cycle VRd x 6 cycles  HD Melphalan 2065mm2 on 03/22/2016 Autologous HSCT on 03/23/2016 [facility]ule [facility] x10^6/kg)    HISTORY OF PRESENTING ILLNESS: Please see my initial consultation for details of her initial presentation.  INTERVAL HISTORY  Paula Johnson here for her scheduled followup for MM . [facility] Maintenance treatment with Revlimid and to restart Zometa. She has been started on Revlimid and notes no issues with tolerance at this time. She notes no other acute new symptoms. She continues to be on acyclovir for VZV prophylaxis.   MEDICAL HISTORY:  Past Medical History:  Diagnosis Date  . Cancer (HCLamesa1999   squamous cell carcinoma of thymus  . Fibroid    reason for Hysterectomy  . Multiple myeloma (HCEvans  . Multiple myeloma (HCLa Paz  . Pain aggravated by activities of daily living    states she pulled something at right sternum in dec 2016  . PONV (postoperative nausea and vomiting)    thinks morphine caused nausea  . Prediabetes   . Urinary incontinence     SURGICAL HISTORY: Past Surgical History:  Procedure Laterality Date  . ABDOMINAL HYSTERECTOMY  1997   TAH--ovaries remain--Dr. ThNewton Pigg. THYMECTOMY  1999    SOCIAL HISTORY: Social History   Social History  . Marital status: Widowed    Spouse name: N/A  . Number of  children: N/A  . Years of education: N/A   Occupational History  . Not on file.   Social History Main Topics  . Smoking status: Never Smoker  . Smokeless tobacco: Never Used  . Alcohol use No  . Drug use: No  . Sexual activity: Yes    Partners: Male    Birth control/ protection: Surgical     Comment: TAH--ovaries remain   Other Topics Concern  . Not on file   Social History Narrative  . No narrative on file    FAMILY HISTORY: Family History  Problem Relation Age of Onset  . COPD Father     dec age 53/s-smoked  . Diabetes Brother   . Other Brother     committed suicide age 62. Hyperlipidemia Mother   . Diabetes Maternal Grandmother     ALLERGIES:  is allergic to penicillins and ampicillin.  MEDICATIONS:  Current Outpatient Prescriptions  Medication Sig Dispense Refill  . [START ON 07/26/2016] acyclovir (ZOVIRAX) 400 MG tablet Take 1 tablet (400 mg total) by mouth 2 (two) times daily. 180 tablet 0  . aspirin EC 81 MG tablet Take 81 mg by mouth daily.    . cholecalciferol (VITAMIN D) 1000 units tablet Take 2,000 Units by mouth daily.    . Paula Johnson Kitchenenalidomide (REVLIMID) 10 MG capsule Take 1 capsule (10 mg total) by mouth daily. Authorization # [id number] for 30 days) 28 capsule 3  . senna-docusate (SENNA S) 8.6-50 MG tablet  Take 2 tablets by mouth at bedtime. (Patient taking differently: Take 2 tablets by mouth 2 (two) times daily at 8 am and 10 pm. ) 60 tablet 1   No current facility-administered medications for this visit.     REVIEW OF SYSTEMS:    10 Point review of Systems was done is negative except as noted above.  PHYSICAL EXAMINATION: ECOG PERFORMANCE STATUS: 1 - Symptomatic but completely ambulatory  . Vitals:   07/04/16 1517  BP: 111/68  Pulse: (!) 103  Resp: 18  Temp: 98.3 F (36.8 C)   Filed Weights   07/04/16 1517  Weight: 132 lb 1.6 oz (59.9 kg)   .Body mass index is 20.69 kg/m.  GENERAL:alert, in no acute distress and comfortable SKIN:  skin color, texture, turgor are normal, no rashes or significant lesions EYES: normal, conjunctiva are pink and non-injected, sclera clear OROPHARYNX:no exudate, no erythema and lips, buccal mucosa, and tongue normal  NECK: supple, no JVD, thyroid normal size, non-tender, without nodularity LYMPH:  no palpable lymphadenopathy in the cervical, axillary or inguinal LUNGS: clear to auscultation with normal respiratory effort HEART: regular rate & rhythm,  no murmurs and no lower extremity edema ABDOMEN: abdomen soft, non-tender, normoactive bowel sounds  Musculoskeletal: no cyanosis of digits and no clubbing  PSYCH: alert & oriented x 3 with fluent speech NEURO: no focal motor/sensory deficits  LABORATORY DATA:  I have reviewed the data as listed . CBC Latest Ref Rng & Units 07/04/2016 05/23/2016 05/16/2016  WBC 3.9 - 10.3 10e3/uL 5.2 5.8 7.1  Hemoglobin 11.6 - 15.9 g/dL 12.9 12.3 11.8  Hematocrit 34.8 - 46.6 % 38.4 36.3 35.1  Platelets 145 - 400 10e3/uL 251 298 306   . CBC    Component Value Date/Time   WBC 5.2 07/04/2016 1500   WBC 2.3 (L) 01/28/2016 0720   RBC 4.12 07/04/2016 1500   RBC 3.79 (L) 01/28/2016 0720   HGB 12.9 07/04/2016 1500   HCT 38.4 07/04/2016 1500   PLT 251 07/04/2016 1500   MCV 93.2 07/04/2016 1500   MCH 31.3 07/04/2016 1500   MCH 30.6 01/28/2016 0720   MCHC 33.6 07/04/2016 1500   MCHC 32.2 01/28/2016 0720   RDW 13.6 07/04/2016 1500   LYMPHSABS 1.0 07/04/2016 1500   MONOABS 0.5 07/04/2016 1500   EOSABS 0.2 07/04/2016 1500   BASOSABS 0.0 07/04/2016 1500     . CMP Latest Ref Rng & Units 07/04/2016 07/04/2016 05/08/2016  Glucose 70 - 140 mg/dl 111 - 93  BUN 7.0 - 26.0 mg/dL 15.1 - 15.1  Creatinine 0.6 - 1.1 mg/dL 0.8 - 0.8  Sodium 136 - 145 mEq/L 142 - 142  Potassium 3.5 - 5.1 mEq/L 3.8 - 4.2  Chloride 101 - 111 mmol/L - - -  CO2 22 - 29 mEq/L 26 - 26  Calcium 8.4 - 10.4 mg/dL 9.9 - 9.9  Total Protein 6.0 - 8.5 g/dL 7.4 6.8 6.9  Total Bilirubin 0.20  - 1.20 mg/dL 0.56 - 0.55  Alkaline Phos 40 - 150 U/L 77 - 65  AST 5 - 34 U/L 18 - 18  ALT 0 - 55 U/L 15 - 13           RADIOGRAPHIC STUDIES: I have personally reviewed the radiological images as listed and agreed with the findings in the report. Dg Cervical Spine 2 Or 3 Views  Result Date: 06/30/2016 CLINICAL DATA:  Bilateral upper extremity weakness for a few months. Neck pain. EXAM: CERVICAL SPINE - 2-3 VIEW  COMPARISON:  None. FINDINGS: Degenerative disc disease with disc space narrowing at at C5-6 and C6-7. Normal alignment. Prevertebral soft tissues are normal. Diffuse degenerative facet disease bilaterally. No fracture or subluxation. IMPRESSION: Degenerative disc and facet disease.  No acute findings. Electronically Signed   By: Rolm Baptise M.D.   On: 06/30/2016 17:03   Mm Digital Screening Bilateral  Result Date: 06/27/2016 CLINICAL DATA:  Screening. EXAM: DIGITAL SCREENING BILATERAL MAMMOGRAM WITH CAD COMPARISON:  Previous exam(s). ACR Breast Density Category b: There are scattered areas of fibroglandular density. FINDINGS: There are no findings suspicious for malignancy. Images were processed with CAD. IMPRESSION: No mammographic evidence of malignancy. A result letter of this screening mammogram will be mailed directly to the patient. RECOMMENDATION: Screening mammogram in one year. (Code:SM-B-01Y) BI-RADS CATEGORY  1: Negative. Electronically Signed   By: Abelardo Diesel M.D.   On: 06/27/2016 10:00    ASSESSMENT & PLAN:    Very pleasant 60 year old Caucasian female with  1) R-ISS Stage II IgA lambda multiple myeloma with standard risk cytogenetics t (11;14). PET/CT shows evidence of lytic bone lesions in the calvarium, iliac bones  and T7. No hypercalcemia or renal failure at this time. Pretreatment M spike of 5 in 2 monoclonal bands. BM bx with 69% plasma cells with Lambda light chain restriction Patient is status post cycle 1 of Vd and 5 cycles of VRd and has she  tolerated well other than some grade 1 rash. Also noted to have fevers from Velcade which are controlled with naproxen.   S/p 6 cycles of VRd- VGPR pre-transplant S/p [facility] 258[facility] Patient is about Day 60 after Auto HSCT. [facility] 0.07 with IFE IgG kappa band and very faint IgA lambda  #2 anemia due to multiple myeloma and treatment medications- resolved #3 fatigue due to multiple myeloma and Anemia - resolved #4 multiple bone metastases from multiple myeloma. - Completed Zometa x 5 doses .  Plan - Patient is doing well with stable labs. -on maintenance Revlimid 10 mg po daily - tolerating it well. Would consider increasing to 162mpo daily in 3 months if tolerated. -continue ASA daily for VTE prophylaxis while on Revlimid -Continue acyclovir VZV prophylaxis for 1 year post-transplant. -restarted on Zometa per [facility] recommendations. - flu vaccination today -Prevnar at 6 months. -for fevers and respiratory infections will need an extended viral panel for evaluation. -for any shortness of breath fevers chills or cough patient will need to get PFT with DLCO -counseled on infection prevention strategies.   RTC with Dr KaIrene Limbon 4 weeks with labs   I spent 20 minutes counseling the patient face to face. The total time spent in the appointment was 25 minutes and more than 50% was on counseling and direct patient cares.    GaSullivan LoneD MSRock RiverAHIVMS SCUc Health Pikes Peak Regional HospitalTNovant Health Prespyterian Medical Centerematology/Oncology Physician CoCartersville Medical Center(Office):       [phone] cell):  [phone]):           [phone]

## 2016-07-19 ENCOUNTER — Other Ambulatory Visit: Payer: Self-pay | Admitting: *Deleted

## 2016-07-19 DIAGNOSIS — C9000 Multiple myeloma not having achieved remission: Secondary | ICD-10-CM

## 2016-07-19 MED ORDER — LENALIDOMIDE 10 MG PO CAPS: 10.0000 mg | ORAL_CAPSULE | Freq: Every day | ORAL | 0 refills | Status: DC

## 2016-07-20 ENCOUNTER — Other Ambulatory Visit: Payer: Self-pay | Admitting: *Deleted

## 2016-07-20 DIAGNOSIS — C9000 Multiple myeloma not having achieved remission: Secondary | ICD-10-CM

## 2016-07-20 MED ORDER — LENALIDOMIDE 10 MG PO CAPS: 10.0000 mg | ORAL_CAPSULE | Freq: Every day | ORAL | 0 refills | Status: DC

## 2016-07-26 ENCOUNTER — Telehealth: Payer: Self-pay | Admitting: General Practice

## 2016-07-26 NOTE — Telephone Encounter (Signed)
Spoke with pt confirmed appt 08/01/2016.

## 2016-08-01 ENCOUNTER — Ambulatory Visit (HOSPITAL_BASED_OUTPATIENT_CLINIC_OR_DEPARTMENT_OTHER): Payer: BLUE CROSS/BLUE SHIELD

## 2016-08-01 ENCOUNTER — Ambulatory Visit: Payer: BLUE CROSS/BLUE SHIELD | Admitting: Hematology

## 2016-08-01 ENCOUNTER — Telehealth: Payer: Self-pay | Admitting: Hematology

## 2016-08-01 ENCOUNTER — Other Ambulatory Visit (HOSPITAL_BASED_OUTPATIENT_CLINIC_OR_DEPARTMENT_OTHER): Payer: BLUE CROSS/BLUE SHIELD

## 2016-08-01 VITALS — BP 108/70 | HR 85 | Temp 98.2°F | Resp 16

## 2016-08-01 DIAGNOSIS — C9000 Multiple myeloma not having achieved remission: Secondary | ICD-10-CM | POA: Diagnosis not present

## 2016-08-01 LAB — CBC & DIFF AND RETIC
BASO%: 0.6 % (ref 0.0–2.0)
BASOS ABS: 0 10*3/uL (ref 0.0–0.1)
EOS ABS: 0.2 10*3/uL (ref 0.0–0.5)
EOS%: 4.8 % (ref 0.0–7.0)
HEMATOCRIT: 40.5 % (ref 34.8–46.6)
HEMOGLOBIN: 13.6 g/dL (ref 11.6–15.9)
IMMATURE RETIC FRACT: 2.8 % (ref 1.60–10.00)
LYMPH#: 1.1 10*3/uL (ref 0.9–3.3)
LYMPH%: 23.4 % (ref 14.0–49.7)
MCH: 31.6 pg (ref 25.1–34.0)
MCHC: 33.6 g/dL (ref 31.5–36.0)
MCV: 94.2 fL (ref 79.5–101.0)
MONO#: 0.6 10*3/uL (ref 0.1–0.9)
MONO%: 11.6 % (ref 0.0–14.0)
NEUT#: 2.9 10*3/uL (ref 1.5–6.5)
NEUT%: 59.6 % (ref 38.4–76.8)
Platelets: 270 10*3/uL (ref 145–400)
RBC: 4.3 10*6/uL (ref 3.70–5.45)
RDW: 13.3 % (ref 11.2–14.5)
RETIC %: 1.14 % (ref 0.70–2.10)
RETIC CT ABS: 49.02 10*3/uL (ref 33.70–90.70)
WBC: 4.8 10*3/uL (ref 3.9–10.3)
nRBC: 0 % (ref 0–0)

## 2016-08-01 LAB — COMPREHENSIVE METABOLIC PANEL
ALBUMIN: 3.7 g/dL (ref 3.5–5.0)
ALK PHOS: 75 U/L (ref 40–150)
ALT: 18 U/L (ref 0–55)
AST: 15 U/L (ref 5–34)
Anion Gap: 9 mEq/L (ref 3–11)
BUN: 17.6 mg/dL (ref 7.0–26.0)
CALCIUM: 9.9 mg/dL (ref 8.4–10.4)
CO2: 26 mEq/L (ref 22–29)
CREATININE: 0.8 mg/dL (ref 0.6–1.1)
Chloride: 106 mEq/L (ref 98–109)
EGFR: 78 mL/min/{1.73_m2} — ABNORMAL LOW (ref >90)
GLUCOSE: 98 mg/dL (ref 70–140)
Potassium: 3.8 mEq/L (ref 3.5–5.1)
SODIUM: 142 meq/L (ref 136–145)
TOTAL PROTEIN: 7.2 g/dL (ref 6.4–8.3)
Total Bilirubin: 0.49 mg/dL (ref 0.20–1.20)

## 2016-08-01 MED ORDER — ZOLEDRONIC ACID 4 MG/100ML IV SOLN
4.0000 mg | Freq: Once | INTRAVENOUS | Status: AC
  Administered 2016-08-01: 4 mg via INTRAVENOUS
  Filled 2016-08-01: qty 100

## 2016-08-01 MED ORDER — SODIUM CHLORIDE 0.9 % IV SOLN
Freq: Once | INTRAVENOUS | Status: AC
  Administered 2016-08-01: 14:00:00 via INTRAVENOUS

## 2016-08-01 NOTE — Telephone Encounter (Signed)
PER GK DUE TO PAL TODAY 11/7 PATIENT F/U MOVED TO 12/5 W/NEXT ZOMETA - LAB ADDED TO 12/5.  S/W PT SHE IS AWARE AND WILL CONFIRM VIA MYCHART.

## 2016-08-01 NOTE — Patient Instructions (Signed)

## 2016-08-16 ENCOUNTER — Other Ambulatory Visit: Payer: Self-pay | Admitting: *Deleted

## 2016-08-16 DIAGNOSIS — C9000 Multiple myeloma not having achieved remission: Secondary | ICD-10-CM

## 2016-08-16 MED ORDER — LENALIDOMIDE 10 MG PO CAPS: 10.0000 mg | ORAL_CAPSULE | Freq: Every day | ORAL | 0 refills | Status: DC

## 2016-08-29 ENCOUNTER — Other Ambulatory Visit (HOSPITAL_BASED_OUTPATIENT_CLINIC_OR_DEPARTMENT_OTHER): Payer: BLUE CROSS/BLUE SHIELD

## 2016-08-29 ENCOUNTER — Ambulatory Visit (HOSPITAL_BASED_OUTPATIENT_CLINIC_OR_DEPARTMENT_OTHER): Payer: BLUE CROSS/BLUE SHIELD | Admitting: Hematology

## 2016-08-29 ENCOUNTER — Ambulatory Visit: Payer: BLUE CROSS/BLUE SHIELD

## 2016-08-29 ENCOUNTER — Encounter: Payer: Self-pay | Admitting: Hematology

## 2016-08-29 ENCOUNTER — Ambulatory Visit (HOSPITAL_BASED_OUTPATIENT_CLINIC_OR_DEPARTMENT_OTHER): Payer: BLUE CROSS/BLUE SHIELD

## 2016-08-29 VITALS — BP 112/65 | HR 87 | Temp 98.4°F | Resp 18 | Ht 67.0 in | Wt 135.1 lb

## 2016-08-29 DIAGNOSIS — C9000 Multiple myeloma not having achieved remission: Secondary | ICD-10-CM

## 2016-08-29 DIAGNOSIS — C9001 Multiple myeloma in remission: Secondary | ICD-10-CM

## 2016-08-29 DIAGNOSIS — D630 Anemia in neoplastic disease: Secondary | ICD-10-CM

## 2016-08-29 DIAGNOSIS — Z9481 Bone marrow transplant status: Secondary | ICD-10-CM

## 2016-08-29 LAB — COMPREHENSIVE METABOLIC PANEL
ALT: 19 U/L (ref 0–55)
ANION GAP: 8 meq/L (ref 3–11)
AST: 17 U/L (ref 5–34)
Albumin: 3.7 g/dL (ref 3.5–5.0)
Alkaline Phosphatase: 79 U/L (ref 40–150)
BILIRUBIN TOTAL: 0.64 mg/dL (ref 0.20–1.20)
BUN: 16.8 mg/dL (ref 7.0–26.0)
CALCIUM: 9 mg/dL (ref 8.4–10.4)
CHLORIDE: 106 meq/L (ref 98–109)
CO2: 24 mEq/L (ref 22–29)
CREATININE: 0.8 mg/dL (ref 0.6–1.1)
EGFR: 84 mL/min/{1.73_m2} — AB (ref >90)
Glucose: 94 mg/dl (ref 70–140)
Potassium: 3.9 mEq/L (ref 3.5–5.1)
Sodium: 139 mEq/L (ref 136–145)
Total Protein: 7.5 g/dL (ref 6.4–8.3)

## 2016-08-29 LAB — CBC & DIFF AND RETIC
BASO%: 0.6 % (ref 0.0–2.0)
BASOS ABS: 0 10*3/uL (ref 0.0–0.1)
EOS%: 4.4 % (ref 0.0–7.0)
Eosinophils Absolute: 0.2 10*3/uL (ref 0.0–0.5)
HEMATOCRIT: 39.3 % (ref 34.8–46.6)
HGB: 13 g/dL (ref 11.6–15.9)
Immature Retic Fract: 2.9 % (ref 1.60–10.00)
LYMPH#: 0.8 10*3/uL — AB (ref 0.9–3.3)
LYMPH%: 21.1 % (ref 14.0–49.7)
MCH: 30.4 pg (ref 25.1–34.0)
MCHC: 33.1 g/dL (ref 31.5–36.0)
MCV: 91.8 fL (ref 79.5–101.0)
MONO#: 0.4 10*3/uL (ref 0.1–0.9)
MONO%: 11.4 % (ref 0.0–14.0)
NEUT#: 2.3 10*3/uL (ref 1.5–6.5)
NEUT%: 62.5 % (ref 38.4–76.8)
PLATELETS: 268 10*3/uL (ref 145–400)
RBC: 4.28 10*6/uL (ref 3.70–5.45)
RDW: 14.1 % (ref 11.2–14.5)
RETIC CT ABS: 55.64 10*3/uL (ref 33.70–90.70)
Retic %: 1.3 % (ref 0.70–2.10)
WBC: 3.6 10*3/uL — ABNORMAL LOW (ref 3.9–10.3)

## 2016-08-29 MED ORDER — ZOLEDRONIC ACID 4 MG/100ML IV SOLN
4.0000 mg | Freq: Once | INTRAVENOUS | Status: AC
  Administered 2016-08-29: 4 mg via INTRAVENOUS
  Filled 2016-08-29: qty 100

## 2016-08-29 MED ORDER — SODIUM CHLORIDE 0.9 % IV SOLN
Freq: Once | INTRAVENOUS | Status: AC
  Administered 2016-08-29: 16:00:00 via INTRAVENOUS

## 2016-08-29 MED ORDER — LENALIDOMIDE 10 MG PO CAPS: 10.0000 mg | ORAL_CAPSULE | Freq: Every day | ORAL | 1 refills | Status: DC

## 2016-08-29 NOTE — Progress Notes (Signed)
Paula Johnson Kitchen    HEMATOLOGY/ONCOLOGY CLINIC NOTE  Date of Service: .08/29/2016  Patient Care Team: Paula Contras, MD as PCP - General (Family Medicine)  CHIEF COMPLAINTS/PURPOSE OF CONSULTATION:  Follow up for multiple myeloma  DIAGNOSIS  IgA lambda R-ISS stage II multiple myeloma with innumerable lytic lesions in the calvarium, lesion in T7 vertebra and multiple lesions in the pelvis.  No significant bone pain at this time. Diagnosed in January 2017.  Current treatment  -Revlimid maintenance 52m po daily -Zometa q 8 weeks  Previous Treatment  Status post Vd x 1 cycle VRd x 6 cycles  HD Melphalan 2036mm2 on 03/22/2016 Autologous HSCT on 03/23/2016 with Dr [facility]Samule Ohmt [facility] 6.4 x10^6/kg)    HISTORY OF PRESENTING ILLNESS: Please see my initial consultation for details of her initial presentation.  INTERVAL HISTORY  Paula Johnson for her scheduled followup for MM . She has taking her Revlimid 10 mg 3 weeks on and one-week off and has tolerated this well without acute concerns. Had a mild upper respiratory tract infection which has resolved. 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 She notes no other acute new symptoms. She continues to be on acyclovir for VZV prophylaxis. No new bone pains.  MEDICAL HISTORY:  Past Medical History:  Diagnosis Date  . Cancer (HCFort [place]1999   squamous cell carcinoma of thymus  . Fibroid    reason for Hysterectomy  . Multiple myeloma (HCDiablo [place]  . Multiple myeloma (HCDana  . Pain aggravated by activities of daily living    states she pulled something at right sternum in dec 2016  . PONV (postoperative nausea and vomiting)    thinks morphine caused nausea  . Prediabetes   . Urinary incontinence     SURGICAL HISTORY: Past Surgical History:  Procedure Laterality Date  . ABDOMINAL HYSTERECTOMY  1997   TAH--ovaries remain--Dr. ThNewton Pigg. THYMECTOMY  1999    SOCIAL  HISTORY: Social History   Social History  . Marital status: Widowed    Spouse name: N/A  . Number of children: N/A  . Years of education: N/A   Occupational History  . Not on file.   Social History Main Topics  . Smoking status: Never Smoker  . Smokeless tobacco: Never Used  . Alcohol use No  . Drug use: No  . Sexual activity: Yes    Partners: Male    Birth control/ protection: Surgical     Comment: TAH--ovaries remain   Other Topics Concern  . Not on file   Social History Narrative  . No narrative on file    FAMILY HISTORY: Family History  Problem Relation Age of Onset  . COPD Father     dec age 24/s-smoked  . Diabetes Brother   . Other Brother     committed suicide age 62. Hyperlipidemia Mother   . Diabetes Maternal Grandmother     ALLERGIES:  is allergic to penicillins and ampicillin.  MEDICATIONS:  Current Outpatient Prescriptions  Medication Sig Dispense Refill  . acyclovir (ZOVIRAX) 400 MG tablet Take 1 tablet (400 mg total) by mouth 2 (two) times daily. 180 tablet 0  . aspirin EC 81 MG tablet Take 81 mg by mouth daily.    . cholecalciferol (VITAMIN D) 1000 units tablet Take 2,000 Units by mouth daily.    . Paula Johnson Kitchenenalidomide (REVLIMID) 10 MG capsule Take 1 capsule (10 mg total) by mouth daily. Authorization #  [id number] (Valid for 30 days) 28 capsule 1  . senna-docusate (SENNA S) 8.6-50 MG tablet Take 2 tablets by mouth at bedtime. (Patient taking differently: Take 2 tablets by mouth 2 (two) times daily at 8 am and 10 pm. ) 60 tablet 1   No current facility-administered medications for this visit.     REVIEW OF SYSTEMS:    10 Point review of Systems was done is negative except as noted above.  PHYSICAL EXAMINATION: ECOG PERFORMANCE STATUS: 1 - Symptomatic but completely ambulatory  . Vitals:   08/29/16 1331  BP: 112/65  Pulse: 87  Resp: 18  Temp: 98.4 F (36.9 C)   Filed Weights   08/29/16 1331  Weight: 135 lb 1.6 oz (61.3 kg)   .Body mass  index is 21.16 kg/m.  GENERAL:alert, in no acute distress and comfortable SKIN: skin color, texture, turgor are normal, no rashes or significant lesions EYES: normal, conjunctiva are pink and non-injected, sclera clear OROPHARYNX:no exudate, no erythema and lips, buccal mucosa, and tongue normal  NECK: supple, no JVD, thyroid normal size, non-tender, without nodularity LYMPH:  no palpable lymphadenopathy in the cervical, axillary or inguinal LUNGS: clear to auscultation with normal respiratory effort HEART: regular rate & rhythm,  no murmurs and no lower extremity edema ABDOMEN: abdomen soft, non-tender, normoactive bowel sounds  Musculoskeletal: no cyanosis of digits and no clubbing  PSYCH: alert & oriented x 3 with fluent speech NEURO: no focal motor/sensory deficits  LABORATORY DATA:  I have reviewed the data as listed . CBC Latest Ref Rng & Units 08/29/2016 08/01/2016 07/04/2016  WBC 3.9 - 10.3 10e3/uL 3.6(L) 4.8 5.2  Hemoglobin 11.6 - 15.9 g/dL 13.0 13.6 12.9  Hematocrit 34.8 - 46.6 % 39.3 40.5 38.4  Platelets 145 - 400 10e3/uL 268 270 251   . CBC    Component Value Date/Time   WBC 3.6 (L) 08/29/2016 1311   WBC 2.3 (L) 01/28/2016 0720   RBC 4.28 08/29/2016 1311   RBC 3.79 (L) 01/28/2016 0720   HGB 13.0 08/29/2016 1311   HCT 39.3 08/29/2016 1311   PLT 268 08/29/2016 1311   MCV 91.8 08/29/2016 1311   MCH 30.4 08/29/2016 1311   MCH 30.6 01/28/2016 0720   MCHC 33.1 08/29/2016 1311   MCHC 32.2 01/28/2016 0720   RDW 14.1 08/29/2016 1311   LYMPHSABS 0.8 (L) 08/29/2016 1311   MONOABS 0.4 08/29/2016 1311   EOSABS 0.2 08/29/2016 1311   BASOSABS 0.0 08/29/2016 1311     . CMP Latest Ref Rng & Units 08/29/2016 08/01/2016 07/04/2016  Glucose 70 - 140 mg/dl 94 98 111  BUN 7.0 - 26.0 mg/dL 16.8 17.6 15.1  Creatinine 0.6 - 1.1 mg/dL 0.8 0.8 0.8  Sodium 136 - 145 mEq/L 139 142 142  Potassium 3.5 - 5.1 mEq/L 3.9 3.8 3.8  Chloride 101 - 111 mmol/L - - -  CO2 22 - 29 mEq/L _0 Calcium 8.4 - 10.4 mg/dL 9.0 9.9 9.9  Total Protein 6.4 - 8.3 g/dL 7.5 7.2 7.4  Total Bilirubin 0.20 - 1.20 mg/dL 0.64 0.49 0.56  Alkaline Phos 40 - 150 U/L 79 75 77  AST 5 - 34 U/L _1 ALT 0 - 55 U/L _2 RADIOGRAPHIC STUDIES: I have personally reviewed the radiological images as listed and agreed with the findings in the report. No results found.  ASSESSMENT & PLAN:  Very pleasant 60 year old Caucasian female with  1) R-ISS Stage II IgA lambda multiple myeloma with standard risk cytogenetics t (11;14). PET/CT shows evidence of lytic bone lesions in the calvarium, iliac bones  and T7. No hypercalcemia or renal failure at this time. Pretreatment M spike of 5 in 2 monoclonal bands. BM bx with 69% plasma cells with Lambda light chain restriction Patient is status post cycle 1 of Vd and 5 cycles of VRd and has she tolerated well other than some grade 1 rash. Also noted to have fevers from Velcade which are controlled with naproxen.   S/p 6 cycles of VRd- VGPR pre-transplant S/p Mel[facility]an 245m[facility] Patient is about Day 60 after Auto HSCT. [facility] spike of 0.07 with IFE IgG kappa band and very faint IgA lambda Repeat myeloma panel on 07/04/2016 showed no M spike and normal serum free light chains #2 anemia due to multiple myeloma and treatment medications- resolved #3 fatigue due to multiple myeloma and Anemia - resolved #4 multiple bone metastases from multiple myeloma. - Completed Zometa x 5 doses .  Plan - Patient is doing well with stable labs. -on maintenance Revlimid 10 mg po 3 weeks on 1 week off- tolerating it well.  -Patient was instructed to go to Revlimid 10 mg by mouth daily without one-week break . -If she tolerates the 10 mg daily Revlimid we would would consider increasing to 126mpo daily on follow-up in about 6 weeks. -continue ASA daily for VTE  prophylaxis while on Revlimid -Continue acyclovir VZV prophylaxis for 1 year post-transplant. -restarted on Zometa per [facility] recommendations. We'll do this every 8 weeks. We'll schedule the patient for this after her next follow-up clinic visit. - flu vaccination today -Prevnar at 6 months. -for fevers and respiratory infections will need an extended viral panel for evaluation. -for any shortness of breath fevers chills or cough patient will need to get PFT with DLCO -counseled on infection prevention strategies.   RTC with Dr KaIrene Limbon 6 weeks with labs and appointment for Zometa infusion after clinic visit.  I spent 20 minutes counseling the patient face to face. The total time spent in the appointment was 25 minutes and more than 50% was on counseling and direct patient cares.    GaSullivan LoneD MSChalkhillAHIVMS SCPinckneyville Community HospitalTDubuis Hospital Of Parisematology/Oncology Physician CoRenaissance Asc LLC(Office):       [phone] cell):  [phone]):           [phone]

## 2016-08-29 NOTE — Patient Instructions (Signed)

## 2016-08-30 LAB — KAPPA/LAMBDA LIGHT CHAINS
IG KAPPA FREE LIGHT CHAIN: 18.2 mg/L (ref 3.3–19.4)
Ig Lambda Free Light Chain: 25 mg/L (ref 5.7–26.3)
KAPPA/LAMBDA FLC RATIO: 0.73 (ref 0.26–1.65)

## 2016-09-01 LAB — MULTIPLE MYELOMA PANEL, SERUM
ALBUMIN SERPL ELPH-MCNC: 3.8 g/dL (ref 2.9–4.4)
ALBUMIN/GLOB SERPL: 1.4 (ref 0.7–1.7)
Alpha 1: 0.3 g/dL (ref 0.0–0.4)
Alpha2 Glob SerPl Elph-Mcnc: 0.7 g/dL (ref 0.4–1.0)
B-Globulin SerPl Elph-Mcnc: 1 g/dL (ref 0.7–1.3)
GAMMA GLOB SERPL ELPH-MCNC: 0.8 g/dL (ref 0.4–1.8)
GLOBULIN, TOTAL: 2.8 g/dL (ref 2.2–3.9)
IGA/IMMUNOGLOBULIN A, SERUM: 191 mg/dL (ref 87–352)
IGM (IMMUNOGLOBIN M), SRM: 55 mg/dL (ref 26–217)
IgG, Qn, Serum: 841 mg/dL (ref 700–1600)
Total Protein: 6.6 g/dL (ref 6.0–8.5)

## 2016-09-14 ENCOUNTER — Telehealth: Payer: Self-pay | Admitting: *Deleted

## 2016-09-14 ENCOUNTER — Other Ambulatory Visit: Payer: Self-pay | Admitting: *Deleted

## 2016-09-14 DIAGNOSIS — C9000 Multiple myeloma not having achieved remission: Secondary | ICD-10-CM

## 2016-09-14 MED ORDER — LENALIDOMIDE 10 MG PO CAPS: 10.0000 mg | ORAL_CAPSULE | Freq: Every day | ORAL | 1 refills | Status: DC

## 2016-09-14 NOTE — Telephone Encounter (Signed)
Patient called stating that she has developing increasing right breast pain for 2 weeks. RN informed MD Burr Medico. MD Burr Medico is willing to see patient 09/15/16. Patient informed and scheduled.

## 2016-10-09 ENCOUNTER — Ambulatory Visit (HOSPITAL_BASED_OUTPATIENT_CLINIC_OR_DEPARTMENT_OTHER): Payer: BLUE CROSS/BLUE SHIELD | Admitting: Hematology

## 2016-10-09 ENCOUNTER — Ambulatory Visit (HOSPITAL_BASED_OUTPATIENT_CLINIC_OR_DEPARTMENT_OTHER): Payer: BLUE CROSS/BLUE SHIELD

## 2016-10-09 ENCOUNTER — Other Ambulatory Visit: Payer: Self-pay | Admitting: *Deleted

## 2016-10-09 ENCOUNTER — Other Ambulatory Visit (HOSPITAL_BASED_OUTPATIENT_CLINIC_OR_DEPARTMENT_OTHER): Payer: BLUE CROSS/BLUE SHIELD

## 2016-10-09 ENCOUNTER — Encounter: Payer: Self-pay | Admitting: Hematology

## 2016-10-09 VITALS — BP 124/79 | HR 84 | Temp 98.2°F | Resp 16 | Wt 132.5 lb

## 2016-10-09 DIAGNOSIS — C9000 Multiple myeloma not having achieved remission: Secondary | ICD-10-CM | POA: Diagnosis not present

## 2016-10-09 DIAGNOSIS — Z23 Encounter for immunization: Secondary | ICD-10-CM | POA: Diagnosis not present

## 2016-10-09 DIAGNOSIS — C9001 Multiple myeloma in remission: Secondary | ICD-10-CM

## 2016-10-09 LAB — COMPREHENSIVE METABOLIC PANEL
ALBUMIN: 4 g/dL (ref 3.5–5.0)
ALK PHOS: 78 U/L (ref 40–150)
ALT: 25 U/L (ref 0–55)
AST: 19 U/L (ref 5–34)
Anion Gap: 10 mEq/L (ref 3–11)
BILIRUBIN TOTAL: 0.99 mg/dL (ref 0.20–1.20)
BUN: 14.4 mg/dL (ref 7.0–26.0)
CO2: 26 meq/L (ref 22–29)
CREATININE: 0.8 mg/dL (ref 0.6–1.1)
Calcium: 9.9 mg/dL (ref 8.4–10.4)
Chloride: 104 mEq/L (ref 98–109)
EGFR: 79 mL/min/{1.73_m2} — AB (ref >90)
GLUCOSE: 88 mg/dL (ref 70–140)
Potassium: 4.2 mEq/L (ref 3.5–5.1)
SODIUM: 140 meq/L (ref 136–145)
TOTAL PROTEIN: 7.4 g/dL (ref 6.4–8.3)

## 2016-10-09 LAB — CBC & DIFF AND RETIC
BASO%: 1.5 % (ref 0.0–2.0)
Basophils Absolute: 0.1 10*3/uL (ref 0.0–0.1)
EOS ABS: 0.2 10*3/uL (ref 0.0–0.5)
EOS%: 5.6 % (ref 0.0–7.0)
HCT: 38.7 % (ref 34.8–46.6)
HEMOGLOBIN: 13 g/dL (ref 11.6–15.9)
IMMATURE RETIC FRACT: 1.9 % (ref 1.60–10.00)
LYMPH#: 1 10*3/uL (ref 0.9–3.3)
LYMPH%: 26.4 % (ref 14.0–49.7)
MCH: 30.6 pg (ref 25.1–34.0)
MCHC: 33.6 g/dL (ref 31.5–36.0)
MCV: 91.1 fL (ref 79.5–101.0)
MONO#: 0.5 10*3/uL (ref 0.1–0.9)
MONO%: 13.1 % (ref 0.0–14.0)
NEUT%: 53.4 % (ref 38.4–76.8)
NEUTROS ABS: 2.1 10*3/uL (ref 1.5–6.5)
Platelets: 258 10*3/uL (ref 145–400)
RBC: 4.25 10*6/uL (ref 3.70–5.45)
RDW: 15.1 % — ABNORMAL HIGH (ref 11.2–14.5)
RETIC CT ABS: 60.78 10*3/uL (ref 33.70–90.70)
Retic %: 1.43 % (ref 0.70–2.10)
WBC: 3.9 10*3/uL (ref 3.9–10.3)

## 2016-10-09 MED ORDER — LENALIDOMIDE 15 MG PO CAPS: 15.0000 mg | ORAL_CAPSULE | Freq: Every day | ORAL | 3 refills | Status: DC

## 2016-10-09 MED ORDER — PNEUMOCOCCAL 13-VAL CONJ VACC IM SUSP
0.5000 mL | Freq: Once | INTRAMUSCULAR | Status: AC
  Administered 2016-10-09: 0.5 mL via INTRAMUSCULAR
  Filled 2016-10-09: qty 0.5

## 2016-10-09 MED ORDER — SODIUM CHLORIDE 0.9 % IV SOLN
Freq: Once | INTRAVENOUS | Status: AC
  Administered 2016-10-09: 16:00:00 via INTRAVENOUS

## 2016-10-09 MED ORDER — ZOLEDRONIC ACID 4 MG/100ML IV SOLN
4.0000 mg | Freq: Once | INTRAVENOUS | Status: AC
  Administered 2016-10-09: 4 mg via INTRAVENOUS
  Filled 2016-10-09: qty 100

## 2016-10-09 NOTE — Progress Notes (Signed)
Per [name] proceed with Zometa today.

## 2016-10-09 NOTE — Patient Instructions (Signed)

## 2016-10-10 LAB — KAPPA/LAMBDA LIGHT CHAINS
Ig Kappa Free Light Chain: 23.5 mg/L — ABNORMAL HIGH (ref 3.3–19.4)
Ig Lambda Free Light Chain: 26.7 mg/L — ABNORMAL HIGH (ref 5.7–26.3)
Kappa/Lambda FluidC Ratio: 0.88 (ref 0.26–1.65)

## 2016-10-10 NOTE — Telephone Encounter (Signed)
This is Paula Johnson calling to clarify the Revlimid order.  We can only fill a 28-day supply.  The quantity is thirty."   Instructed to fill for quantity of 28 pills and no refills allowed.  Every refill requires a new authorization number.

## 2016-10-11 LAB — MULTIPLE MYELOMA PANEL, SERUM
ALBUMIN/GLOB SERPL: 1.2 (ref 0.7–1.7)
ALPHA 1: 0.3 g/dL (ref 0.0–0.4)
ALPHA2 GLOB SERPL ELPH-MCNC: 0.7 g/dL (ref 0.4–1.0)
Albumin SerPl Elph-Mcnc: 3.7 g/dL (ref 2.9–4.4)
B-GLOBULIN SERPL ELPH-MCNC: 1.1 g/dL (ref 0.7–1.3)
GAMMA GLOB SERPL ELPH-MCNC: 1 g/dL (ref 0.4–1.8)
GLOBULIN, TOTAL: 3.1 g/dL (ref 2.2–3.9)
IGA/IMMUNOGLOBULIN A, SERUM: 235 mg/dL (ref 87–352)
IgG, Qn, Serum: 860 mg/dL (ref 700–1600)
IgM, Qn, Serum: 50 mg/dL (ref 26–217)
Total Protein: 6.8 g/dL (ref 6.0–8.5)

## 2016-10-13 ENCOUNTER — Telehealth: Payer: Self-pay | Admitting: Pharmacist

## 2016-10-13 NOTE — Telephone Encounter (Signed)
Oral Chemotherapy Pharmacist Encounter  Received notification from [place], [place] that patient had expressed frustration with [facility] and being able to get her Revlimid filled on time.  I called [facility] at [phone] to inquire about any issue.  Patient's current Revlimid prescription requires prior authorization with the new year and this is in process. Once the pharmacy is able to re-run the patient's prescription, they will handle any copay issues that arise.  The technician went through each of the previous 3 fills (Oct-Dec 2017) and was able to detail each date that counseling was offered, insurance was dealt with, and medication was shipped and delivered.  They had prior authorization delay with the October fill.  There was not delay with November start.  December fill was done later than patient had wanted, but the pharmacy could not dispense more than [employer] would allow.  I did update the fax number associated with patient's account at [facility] with the fax number to Dr. Grier Mitts pod with the hopes this will decrease some delays in the future.  I left a very generic message for the patient with some of the above information and the phone number to the pharmacy as well as the number to the Oral Oncology Clnic.  Please let us know if we can be of more help in the future.  Johny Drilling, PharmD, BCPS, BCOP 10/13/2016  4:16 PM Oral Oncology Clinic [phone]

## 2016-10-15 ENCOUNTER — Telehealth: Payer: Self-pay | Admitting: Hematology

## 2016-10-15 NOTE — Telephone Encounter (Signed)
S/w pt, gave appt for 2/13 @ 3.30pm.

## 2016-10-16 NOTE — Progress Notes (Signed)
Paula Johnson    HEMATOLOGY/ONCOLOGY CLINIC NOTE  Date of Service: .10/09/2016  Patient Care Team: [name], MD as PCP - General (Family Medicine)  CHIEF COMPLAINTS/PURPOSE OF CONSULTATION:  Follow up for multiple myeloma  DIAGNOSIS  IgA lambda R-ISS stage II multiple myeloma with innumerable lytic lesions in the calvarium, lesion in T7 vertebra and multiple lesions in the pelvis.  No significant bone pain at this time. Diagnosed in January 2017.  Current treatment  -Revlimid maintenance 55m po daily -Zometa q 8 weeks  Previous Treatment  Status post Vd x 1 cycle VRd x 6 cycles  HD Melphalan 2024mm2 on 03/22/2016 Autologous HSCT on 03/23/2016 [facility]10^6/kg)    HISTORY OF PRESENTING ILLNESS: Please see my initial consultation for details of her initial presentation.  INTERVAL HISTORY  Mrs. CoShugrues here for her scheduled followup for MM . She has taking her Revlimid 10 mg daily and has tolerated this well without acute concerns. She notes no other acute new symptoms. Has a stress job but is looking forward to some vacation time. She continues to be on acyclovir for VZV prophylaxis. We discussed the pros and cons of dose escalating to Revlimid 1530mo daily for maintenance and she would like to do this if she tolerated this. No new bone pains.  MEDICAL HISTORY:  Past Medical History:  Diagnosis Date  . [place]   squamous cell carcinoma of thymus  . Fibroid    reason for Hysterectomy  . [place] . Pain aggravated by activities of daily living    states she pulled something at right sternum in dec 2016  . PONV (postoperative nausea and vomiting)    thinks morphine caused nausea  . Prediabetes   . Urinary incontinence     SURGICAL HISTORY: Past Surgical History:  Procedure Laterality Date  . ABDOMINAL HYSTERECTOMY  1997   TAH--ovaries remain--Dr. ThoNewton Pigg THYMECTOMY   1999    SOCIAL HISTORY: Social History   Social History  . Marital status: Widowed    Spouse name: N/A  . Number of children: N/A  . Years of education: N/A   Occupational History  . Not on file.   Social History Main Topics  . Smoking status: Never Smoker  . Smokeless tobacco: Never Used  . Alcohol use No  . Drug use: No  . Sexual activity: Yes    Partners: Male    Birth control/ protection: Surgical     Comment: TAH--ovaries remain   Other Topics Concern  . Not on file   Social History Narrative  . No narrative on file    FAMILY HISTORY: Family History  Problem Relation Age of Onset  . COPD Father     dec age 47/s-smoked  . Diabetes Brother   . Other Brother     committed suicide age 17 23 Hyperlipidemia Mother   . Diabetes Maternal Grandmother     ALLERGIES:  is allergic to penicillins and ampicillin.  MEDICATIONS:  Current Outpatient Prescriptions  Medication Sig Dispense Refill  . acyclovir (ZOVIRAX) 400 MG tablet Take 1 tablet (400 mg total) by mouth 2 (two) times daily. 180 tablet 0  . aspirin EC 81 MG tablet Take 81 mg by mouth daily.    . cholecalciferol (VITAMIN D) 1000 units tablet Take 2,000 Units by mouth daily.    . sMarland Kitchennna-docusate (SENNA S) 8.6-50 MG tablet Take 2  tablets by mouth at bedtime. (Patient taking differently: Take 2 tablets by mouth 2 (two) times daily at 8 am and 10 pm. ) 60 tablet 1  . lenalidomide (REVLIMID) 15 MG capsule Take 1 capsule (15 mg total) by mouth daily. Authorization # [id number] (Valid for 30 days) 30 capsule 3   No current facility-administered medications for this visit.     REVIEW OF SYSTEMS:    10 Point review of Systems was done is negative except as noted above.  PHYSICAL EXAMINATION: ECOG PERFORMANCE STATUS: 1 - Symptomatic but completely ambulatory  . Vitals:   10/09/16 1502  BP: 124/79  Pulse: 84  Resp: 16  Temp: 98.2 F (36.8 C)   Filed Weights   10/09/16 1502  Weight: 132 lb 8 oz (60.1 kg)     .Body mass index is 20.75 kg/m.  GENERAL:alert, in no acute distress and comfortable SKIN: skin color, texture, turgor are normal, no rashes or significant lesions EYES: normal, conjunctiva are pink and non-injected, sclera clear OROPHARYNX:no exudate, no erythema and lips, buccal mucosa, and tongue normal  NECK: supple, no JVD, thyroid normal size, non-tender, without nodularity LYMPH:  no palpable lymphadenopathy in the cervical, axillary or inguinal LUNGS: clear to auscultation with normal respiratory effort HEART: regular rate & rhythm,  no murmurs and no lower extremity edema ABDOMEN: abdomen soft, non-tender, normoactive bowel sounds  Musculoskeletal: no cyanosis of digits and no clubbing  PSYCH: alert & oriented x 3 with fluent speech NEURO: no focal motor/sensory deficits  LABORATORY DATA:  I have reviewed the data as listed . CBC Latest Ref Rng & Units 10/09/2016 08/29/2016 08/01/2016  WBC 3.9 - 10.3 10e3/uL 3.9 3.6(L) 4.8  Hemoglobin 11.6 - 15.9 g/dL 13.0 13.0 13.6  Hematocrit 34.8 - 46.6 % 38.7 39.3 40.5  Platelets 145 - 400 10e3/uL 258 268 270   . CBC    Component Value Date/Time   WBC 3.9 10/09/2016 1445   WBC 2.3 (L) 01/28/2016 0720   RBC 4.25 10/09/2016 1445   RBC 3.79 (L) 01/28/2016 0720   HGB 13.0 10/09/2016 1445   HCT 38.7 10/09/2016 1445   PLT 258 10/09/2016 1445   MCV 91.1 10/09/2016 1445   MCH 30.6 10/09/2016 1445   MCH 30.6 01/28/2016 0720   MCHC 33.6 10/09/2016 1445   MCHC 32.2 01/28/2016 0720   RDW 15.1 (H) 10/09/2016 1445   LYMPHSABS 1.0 10/09/2016 1445   MONOABS 0.5 10/09/2016 1445   EOSABS 0.2 10/09/2016 1445   BASOSABS 0.1 10/09/2016 1445     . CMP Latest Ref Rng & Units 10/09/2016 10/09/2016 08/29/2016  Glucose 70 - 140 mg/dl 88 - 94  BUN 7.0 - 26.0 mg/dL 14.4 - 16.8  Creatinine 0.6 - 1.1 mg/dL 0.8 - 0.8  Sodium 136 - 145 mEq/L 140 - 139  Potassium 3.5 - 5.1 mEq/L 4.2 - 3.9  Chloride 101 - 111 mmol/L - - -  CO2 22 - 29 mEq/L 26 - 24   Calcium 8.4 - 10.4 mg/dL 9.9 - 9.0  Total Protein 6.0 - 8.5 g/dL 7.4 6.8 7.5  Total Bilirubin 0.20 - 1.20 mg/dL 0.99 - 0.64  Alkaline Phos 40 - 150 U/L 78 - 79  AST 5 - 34 U/L 19 - 17  ALT 0 - 55 U/L 25 - 19           RADIOGRAPHIC STUDIES: I have personally reviewed the radiological images as listed and agreed with the findings in the report. No results found.  ASSESSMENT &  PLAN:    Very pleasant 61 year old Caucasian female with  1) R-ISS Stage II IgA lambda multiple myeloma with standard risk cytogenetics t (11;14). PET/CT shows evidence of lytic bone lesions in the calvarium, iliac bones  and T7. No hypercalcemia or renal failure at this time. Pretreatment M spike of 5 in 2 monoclonal bands. BM bx with 69% plasma cells with Lambda light chain restriction Patient is status post cycle 1 of Vd and 5 cycles of VRd and has she tolerated well other than some grade 1 rash. Also noted to have fevers from Velcade which are controlled with naproxen.   S/p 6 cycles of VRd- VGPR pre-transplant S[facility] Patient is about Day 60 after Auto HSCT. [facility] 0.07 with IFE IgG kappa band and very faint IgA lambda Repeat myeloma panel on 07/04/2016 showed no M spike and normal serum free light chains Rpt Myeloma panel 10/09/2016 -- no M protein and IFE neg #2 anemia due to multiple myeloma and treatment medications- resolved #3 fatigue due to multiple myeloma and Anemia - resolved #4 multiple bone metastases from multiple myeloma.  Plan - Patient is doing well with stable labs. -will dose escalate maintenance Revlimid to 15 mg po daily since she was tolerating 161mo daily well  -continue ASA daily for VTE prophylaxis while on Revlimid -Continue acyclovir VZV prophylaxis for 1 year post-transplant. -restarted on Zometa per [facility] recommendations. We'll do this every 8 weeks. We'll schedule the  patient for this after her next follow-up clinic visit. - Prevnar done today -Pneumovax on next clinic visit -counseled on infection prevention strategies.   RTC with Dr KaIrene Limbon 4 weeks with labs since we increased dose of Revlimid  I spent 20 minutes counseling the patient face to face. The total time spent in the appointment was 25 minutes and more than 50% was on counseling and direct patient cares.    GaSullivan LoneD MSBellevueAHIVMS SCStarr County Memorial HospitalTDuke University Hospitalematology/Oncology Physician CoMadelia Community Hospital(Office):       [phone] cell):  [phone]):           [phone]

## 2016-10-18 ENCOUNTER — Other Ambulatory Visit: Payer: Self-pay | Admitting: *Deleted

## 2016-10-18 ENCOUNTER — Telehealth: Payer: Self-pay | Admitting: Pharmacist

## 2016-10-18 ENCOUNTER — Telehealth: Payer: Self-pay | Admitting: *Deleted

## 2016-10-18 DIAGNOSIS — C9000 Multiple myeloma not having achieved remission: Secondary | ICD-10-CM

## 2016-10-18 MED ORDER — LENALIDOMIDE 15 MG PO CAPS: 15.0000 mg | ORAL_CAPSULE | Freq: Every day | ORAL | 3 refills | Status: DC

## 2016-10-18 NOTE — Telephone Encounter (Signed)
I s/w Tonye Royalty, RN today after she talked w/ pts husband over the phone re: Revlimid refill delay.  Pts husband, [name] was very frustrated that pt has been off Revlimid for 1 week.  [name] wants answers to why.  I contacted [employer] (ph# [phone]) and was told that the PA was pending.  I asked when the PA was initiated.  This date was not indicated.  I was told that a [employer] rep s/w pt yesterday (1/23) and informed the pt that PA was pending and that a [employer] rep would f/u w/ pts insurance company today (1/24).   I was transferred to the PA dept at [employer] and after holding for 10-15 minutes, I was placed in a voice mailbox.  I left a message requesting a call back in addition to a call from [employer] to the pt/her husband explaining the reason for the delay in receiving the Revlimid.  I called [name] w/ this information.  He is ready to switch pharmacies from [facility].  He will call [facility] for a complete list of pharmacies to which he can transfer the prescription.  I did explain that the insurance company (in this case, [employer]) dictates where the Revlimid is filled and that there are limited pharmacies that are registered w/ [employer] to dispense Revlimid and monitor thru their REMS program.  I also suggested that [name] continue to call [employer] and next time as to speak to a supervisor.  I confirmed that [name] has the correct phone number to [employer].  Charles did not understand the PA process so I explained that to him.  I told him that sometimes it takes up to 1 week for a PA to be completed.  He stated that his wife is having increased side effects from the Revlimid.  He has a long list of her side effects.  He says these side effects are about to cause his wife to lose her job.  Pts husband understands that at this time, we have done all we know to do and that [facility] is ultimately who he should reach out to at this point.  If they decide to use a different pharmacy,  we will need to know so that we can send future refill requests to the correct pharmacy.  Kennith Center, Pharm.D., CPP 10/18/2016@1 :[facility]

## 2016-10-18 NOTE — Telephone Encounter (Signed)
S/w pt husband charles regarding his conversation about Revlimid with [facility] in [facility].  Pt wishes to cancel rx with [facility] and send to [facility].  Fax number [phone].  Rx faxed by this RN.  Pharmacy added to pt pharmacy list.

## 2016-10-18 NOTE — Telephone Encounter (Signed)
S/w pt's husband after previous conversation with [employer], RPh regarding [facility]/Revlimid rx.  Pt now wants rx filled through [facility].  [facility].  Fax:  [phone].  [facility] added to [facility].

## 2016-10-19 NOTE — Telephone Encounter (Signed)
Received insurance notification that Revlimid has been approved through [facility] from 10/16/2016-10/16/2017. Patient states that [facility] have been late with shipment 2 months in a row so she was contemplating [facility]. Informed patient that since she already has insurance approval, we recommend her to stick with [facility]. Patient agrees and states she will track her progress with [facility] shipment and if it continues to be late then she will switch to [facility].

## 2016-10-29 ENCOUNTER — Emergency Department (HOSPITAL_COMMUNITY)
Admission: EM | Admit: 2016-10-29 | Discharge: 2016-10-29 | Disposition: A | Discharge status: Home / Self Care | Payer: BLUE CROSS/BLUE SHIELD | Attending: Emergency Medicine | Admitting: Emergency Medicine

## 2016-10-29 ENCOUNTER — Emergency Department (HOSPITAL_BASED_OUTPATIENT_CLINIC_OR_DEPARTMENT_OTHER)
Admission: EM | Admit: 2016-10-29 | Discharge: 2016-10-29 | Disposition: A | Discharge status: Home / Self Care | Payer: BLUE CROSS/BLUE SHIELD | Source: Home / Self Care

## 2016-10-29 ENCOUNTER — Other Ambulatory Visit: Payer: Self-pay | Admitting: Hematology

## 2016-10-29 ENCOUNTER — Encounter (HOSPITAL_COMMUNITY): Payer: Self-pay | Admitting: Emergency Medicine

## 2016-10-29 DIAGNOSIS — M7989 Other specified soft tissue disorders: Secondary | ICD-10-CM | POA: Diagnosis present

## 2016-10-29 DIAGNOSIS — C9000 Multiple myeloma not having achieved remission: Secondary | ICD-10-CM

## 2016-10-29 DIAGNOSIS — R600 Localized edema: Secondary | ICD-10-CM | POA: Insufficient documentation

## 2016-10-29 DIAGNOSIS — Z85238 Personal history of other malignant neoplasm of thymus: Secondary | ICD-10-CM | POA: Insufficient documentation

## 2016-10-29 DIAGNOSIS — Z85830 Personal history of malignant neoplasm of bone: Secondary | ICD-10-CM | POA: Diagnosis not present

## 2016-10-29 DIAGNOSIS — M79609 Pain in unspecified limb: Secondary | ICD-10-CM | POA: Diagnosis not present

## 2016-10-29 DIAGNOSIS — R609 Edema, unspecified: Secondary | ICD-10-CM

## 2016-10-29 NOTE — ED Provider Notes (Signed)
Emergency Department Provider Note   I have reviewed the triage vital signs and the nursing notes.   HISTORY  Chief Complaint Foot Swelling   HPI Paula Johnson is a 61 y.o. female with PMH of MM presents to the emergency department for evaluation of right foot and ankle swelling for the past 2 days. The foot is not painful. She recently traveled back from [place] and denies any obvious injury. She has been up on her feet and walking frequently. Swelling seemed to decrease and then reaccumulated. No redness or warmth. No fever or chills. The patient called her [facility] oncology group who recommended ED presentation and DVT evaluation. Patient denies any history of DVT, active or recent chest pain, or SOB.    Past Medical History:  Diagnosis Date  . Cancer ([place]) 1999   squamous cell carcinoma of thymus  . Fibroid    reason for Hysterectomy  . Multiple myeloma ([place])   . Multiple myeloma ([place])   . Pain aggravated by activities of daily living    states she pulled something at right sternum in dec 2016  . PONV (postoperative nausea and vomiting)    thinks morphine caused nausea  . Prediabetes   . Urinary incontinence     Patient Active Problem List   Diagnosis Date Noted  . Near syncope 01/04/2016  . Peripheral edema 12/19/2015  . Cough 11/30/2015  . Fever 11/08/2015  . Hematuria 11/08/2015  . Rash 11/01/2015  . Multiple myeloma ([place]) 10/19/2015  . Metastatic multiple myeloma to bone ([place]) 10/19/2015  . Absolute anemia   . Plasma cell dyscrasia 10/05/2015  . Anemia 09/29/2015  . Hypergammaglobulinemia 09/29/2015  . HYPOXEMIA 12/16/2007  . HYPERLIPIDEMIA 12/04/2007  . DYSPNEA 12/04/2007  . MALIGNANT NEOPLASM OF THYMUS 12/03/2007  . OBSTRUCTIVE SLEEP APNEA 12/03/2007  . RESTLESS LEGS SYNDROME 12/03/2007    Past Surgical History:  Procedure Laterality Date  . ABDOMINAL HYSTERECTOMY  1997   TAH--ovaries remain--Dr. Newton Pigg  . THYMECTOMY  1999    Current  Outpatient Rx  . Order #: [id number] Class: Normal  . Order #: [id number] Class: Historical Med  . Order #: [id number] Class: Historical Med  . Order #: [id number] Class: Print  . Order #: [id number] Class: Normal    Allergies Penicillins and Ampicillin  Family History  Problem Relation Age of Onset  . COPD Father     dec age 49/s-smoked  . Diabetes Brother   . Other Brother     committed suicide age 35  . Hyperlipidemia Mother   . Diabetes Maternal Grandmother     Social History Social History  Substance Use Topics  . Smoking status: Never Smoker  . Smokeless tobacco: Never Used  . Alcohol use No    Review of Systems  Constitutional: No fever/chills Eyes: No visual changes. ENT: No sore throat. Cardiovascular: Denies chest pain. Respiratory: Denies shortness of breath. Gastrointestinal: No abdominal pain. No nausea, no vomiting. No diarrhea. No constipation. Genitourinary: Negative for dysuria. Musculoskeletal: Negative for back pain. Positive right foot pain.  Skin: Negative for rash. Neurological: Negative for headaches, focal weakness or numbness.  10-point ROS otherwise negative.  ____________________________________________   PHYSICAL EXAM:  VITAL SIGNS: ED Triage Vitals  Enc Vitals Group     BP 10/29/16 1752 140/89     Pulse Rate 10/29/16 1752 88     Resp 10/29/16 1752 19     Temp 10/29/16 1752 98 F (36.7 C)     Temp Source 10/29/16 1752 Oral  SpO2 10/29/16 1752 99 %     Weight 10/29/16 1752 130 lb (59 kg)     Height 10/29/16 1752 5' 8"  (1.727 m)     Pain Score 10/29/16 1753 0   Constitutional: Alert and oriented. Well appearing and in no acute distress. Eyes: Conjunctivae are normal. Head: Atraumatic. Nose: No congestion/rhinnorhea. Mouth/Throat: Mucous membranes are moist.   Neck: No stridor.   Cardiovascular: Normal rate, regular rhythm. Good peripheral circulation. Grossly normal heart sounds.   Respiratory: Normal respiratory effort.  No  retractions. Lungs CTAB. Gastrointestinal: Soft and nontender. No distention.  Musculoskeletal: No lower extremity tenderness. Positive right ankle/foot swelling. No gross deformities of extremities. Normal gait.  Neurologic:  Normal speech and language. No gross focal neurologic deficits are appreciated.  Skin:  Skin is warm, dry and intact. No rash noted. Psychiatric: Mood and affect are normal. Speech and behavior are normal.  ____________________________________________  RADIOLOGY  No evidence of DVT on RLE [place].  ____________________________________________   PROCEDURES  Procedure(s) performed:   Procedures  None ____________________________________________   INITIAL IMPRESSION / ASSESSMENT AND PLAN / ED COURSE  Pertinent labs & imaging results that were available during my care of the patient were reviewed by me and considered in my medical decision making (see chart for details).  Patient resents to the emergency department for evaluation of right ankle and foot swelling. She has a history of multiple myeloma. She has some mild dilation the superficial veins relatively normal caliber of her calf. No popliteal tenderness. No redness or warmth to the foot or ankle to suggest infectious process. Patient having no pain in the foot or ankle to suggest underlying fracture or injury. No clear indication for x-ray at this time.   Normal US of the RLE. Advised elevation and compression. Will discharge with PCP and Oncology follow up.   At this time, I do not feel there is any life-threatening condition present. I have reviewed and discussed all results (EKG, imaging, lab, urine as appropriate), exam findings with patient. I have reviewed nursing notes and appropriate previous records.  I feel the patient is safe to be discharged home without further emergent workup. Discussed usual and customary return precautions. Patient and family (if present) verbalize understanding and are  comfortable with this plan.  Patient will follow-up with their primary care provider. If they do not have a primary care provider, information for follow-up has been provided to them. All questions have been answered.  ____________________________________________  FINAL CLINICAL IMPRESSION(S) / ED DIAGNOSES  Final diagnoses:  Peripheral edema     MEDICATIONS GIVEN DURING THIS VISIT:  None  NEW OUTPATIENT MEDICATIONS STARTED DURING THIS VISIT:  None   Note:  This document was prepared using Dragon voice recognition software and may include unintentional dictation errors.  Nanda Quinton, MD Emergency Medicine   Margette Fast, MD 10/30/16 1050

## 2016-10-29 NOTE — Discharge Instructions (Signed)
You were seen in the ED today with foot and leg pain. There was no evidence of blood clot on ultrasound. Follow up with your PCP and keep the foot elevated and wrapped with ACE bandage.   Return with worsening pain, swelling, chest pain, or difficulty breathing.

## 2016-10-29 NOTE — Progress Notes (Signed)
VASCULAR LAB PRELIMINARY  PRELIMINARY  PRELIMINARY  PRELIMINARY  Right lower extremity venous duplex completed.    Preliminary report:  There is no DVT or SVT noted in the right lower extremity.  Gave report to everyone in the doctor's reading room as Dr. Laverta Baltimore was not present.  Reise Hietala, RVT 10/29/2016, 6:30 PM

## 2016-10-29 NOTE — ED Triage Notes (Signed)
Patient reports having right foot and ankle swelling x couple days.  Patient states that she was in [place] and flew.  Patient called MD at [facility] and was told to come to ED to rule out DVT.  Patient denies pain.  Patient getting treatments for multiple myeloma.

## 2016-11-07 ENCOUNTER — Other Ambulatory Visit (HOSPITAL_BASED_OUTPATIENT_CLINIC_OR_DEPARTMENT_OTHER): Payer: BLUE CROSS/BLUE SHIELD

## 2016-11-07 ENCOUNTER — Ambulatory Visit (HOSPITAL_BASED_OUTPATIENT_CLINIC_OR_DEPARTMENT_OTHER): Payer: BLUE CROSS/BLUE SHIELD | Admitting: Hematology

## 2016-11-07 ENCOUNTER — Encounter: Payer: Self-pay | Admitting: Hematology

## 2016-11-07 DIAGNOSIS — C9000 Multiple myeloma not having achieved remission: Secondary | ICD-10-CM

## 2016-11-07 DIAGNOSIS — R5383 Other fatigue: Secondary | ICD-10-CM | POA: Diagnosis not present

## 2016-11-07 DIAGNOSIS — D630 Anemia in neoplastic disease: Secondary | ICD-10-CM | POA: Diagnosis not present

## 2016-11-07 DIAGNOSIS — C9001 Multiple myeloma in remission: Secondary | ICD-10-CM | POA: Diagnosis not present

## 2016-11-07 LAB — CBC & DIFF AND RETIC
BASO%: 0.7 % (ref 0.0–2.0)
Basophils Absolute: 0 10*3/uL (ref 0.0–0.1)
EOS ABS: 0.2 10*3/uL (ref 0.0–0.5)
EOS%: 4 % (ref 0.0–7.0)
HCT: 37.1 % (ref 34.8–46.6)
HEMOGLOBIN: 12.2 g/dL (ref 11.6–15.9)
IMMATURE RETIC FRACT: 3.9 % (ref 1.60–10.00)
LYMPH#: 1.1 10*3/uL (ref 0.9–3.3)
LYMPH%: 24 % (ref 14.0–49.7)
MCH: 30.3 pg (ref 25.1–34.0)
MCHC: 32.9 g/dL (ref 31.5–36.0)
MCV: 92.3 fL (ref 79.5–101.0)
MONO#: 0.6 10*3/uL (ref 0.1–0.9)
MONO%: 13.5 % (ref 0.0–14.0)
NEUT#: 2.6 10*3/uL (ref 1.5–6.5)
NEUT%: 57.8 % (ref 38.4–76.8)
PLATELETS: 253 10*3/uL (ref 145–400)
RBC: 4.02 10*6/uL (ref 3.70–5.45)
RDW: 15.8 % — ABNORMAL HIGH (ref 11.2–14.5)
RETIC CT ABS: 52.26 10*3/uL (ref 33.70–90.70)
Retic %: 1.3 % (ref 0.70–2.10)
WBC: 4.5 10*3/uL (ref 3.9–10.3)

## 2016-11-07 LAB — COMPREHENSIVE METABOLIC PANEL
ALBUMIN: 3.8 g/dL (ref 3.5–5.0)
ALT: 17 U/L (ref 0–55)
AST: 16 U/L (ref 5–34)
Alkaline Phosphatase: 79 U/L (ref 40–150)
Anion Gap: 9 mEq/L (ref 3–11)
BILIRUBIN TOTAL: 0.72 mg/dL (ref 0.20–1.20)
BUN: 14.6 mg/dL (ref 7.0–26.0)
CO2: 24 meq/L (ref 22–29)
CREATININE: 0.8 mg/dL (ref 0.6–1.1)
Calcium: 9.3 mg/dL (ref 8.4–10.4)
Chloride: 106 mEq/L (ref 98–109)
EGFR: 79 mL/min/{1.73_m2} — ABNORMAL LOW (ref >90)
GLUCOSE: 98 mg/dL (ref 70–140)
Potassium: 4 mEq/L (ref 3.5–5.1)
SODIUM: 139 meq/L (ref 136–145)
TOTAL PROTEIN: 7 g/dL (ref 6.4–8.3)

## 2016-11-09 ENCOUNTER — Other Ambulatory Visit: Payer: Self-pay | Admitting: *Deleted

## 2016-11-11 ENCOUNTER — Telehealth: Payer: Self-pay | Admitting: Hematology

## 2016-11-11 NOTE — Progress Notes (Signed)
Marland Kitchen    HEMATOLOGY/ONCOLOGY CLINIC NOTE  Date of Service: .11/07/2016  Patient Care Team: [name], MD as PCP - General (Family Medicine)  CHIEF COMPLAINTS/PURPOSE OF CONSULTATION:  Follow up for multiple myeloma  DIAGNOSIS  IgA lambda R-ISS stage II multiple myeloma with innumerable lytic lesions in the calvarium, lesion in T7 vertebra and multiple lesions in the pelvis.  No significant bone pain at this time. Diagnosed in January 2017.  Current treatment  -Revlimid maintenance 79m po daily -Zometa q 8 weeks  Previous Treatment  Status post Vd x 1 cycle VRd x 6 cycles  HD Melphalan 2068mm2 on 03/22/2016 Autologous HSCT on 03/23/2016 [facility]ule [facility] x10^6/kg)    HISTORY OF PRESENTING ILLNESS: Please see my initial consultation for details of her initial presentation.  INTERVAL HISTORY  Mrs. CoStongs here for her scheduled followup for MM . She notes that she feels no differently with the Revlimid 1580mo daily and not no prohibitive toxicities. 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 She does not feel her cognitive issues have changed with the increased dose of Revlimid. We discussed that it possible to have that as a results of long term effects of HDT and Auto HSCT.  MEDICAL HISTORY:  Past Medical History:  Diagnosis Date  . [place]   squamous cell carcinoma of thymus  . Fibroid    reason for Hysterectomy  . [place] ([place] . Multiple [place]oma ([place] . Pain aggravated by activities of daily living    states she pulled something at right sternum in dec 2016  . PONV (postoperative nausea and vomiting)    thinks morphine caused nausea  . Prediabetes   . Urinary incontinence     SURGICAL HISTORY: Past Surgical History:    Procedure Laterality Date  . ABDOMINAL HYSTERECTOMY  1997   TAH--ovaries remain--Dr. ThoNewton Pigg THYMECTOMY  1999    SOCIAL HISTORY: Social History   Social History  . Marital status: Widowed    Spouse name: N/A  . Number of children: N/A  . Years of education: N/A   Occupational History  . Not on file.   Social History Main Topics  . Smoking status: Never Smoker  . Smokeless tobacco: Never Used  . Alcohol use No  . Drug use: No  . Sexual activity: Yes    Partners: Male    Birth control/ protection: Surgical     Comment: TAH--ovaries remain   Other Topics Concern  . Not on file   Social History Narrative  . No narrative on file    FAMILY HISTORY: Family History  Problem Relation Age of Onset  . COPD Father     dec age 61/s-smoked  . Diabetes Brother   . Other Brother     committed suicide age 78 41 Hyperlipidemia Mother   . Diabetes Maternal Grandmother     ALLERGIES:  is allergic to penicillins and ampicillin.  MEDICATIONS:  Current Outpatient Prescriptions  Medication Sig Dispense Refill  . acyclovir (ZOVIRAX) 400 MG tablet TAKE 1 TABLET (400 MG TOTAL) BY MOUTH 2 (TWO) TIMES DAILY. 180 tablet 0  . aspirin EC 81 MG tablet Take 81 mg by mouth daily.    . cholecalciferol (VITAMIN D) 1000 units  tablet Take 2,000 Units by mouth daily.    Marland Kitchen lenalidomide (REVLIMID) 15 MG capsule Take 1 capsule (15 mg total) by mouth daily. Authorization # [id number] 10/09/16 (Valid for 30 days) 30 capsule 3  . senna-docusate (SENNA S) 8.6-50 MG tablet Take 2 tablets by mouth at bedtime. (Patient taking differently: Take 2 tablets by mouth 2 (two) times daily at 8 am and 10 pm. ) 60 tablet 1   No current facility-administered medications for this visit.     REVIEW OF SYSTEMS:    10 Point review of Systems was done is negative except as noted above.  PHYSICAL EXAMINATION: ECOG PERFORMANCE STATUS: 1 - Symptomatic but completely ambulatory  .VSS as per  EPIC GENERAL:alert, in no acute distress and comfortable SKIN: skin color, texture, turgor are normal, no rashes or significant lesions EYES: normal, conjunctiva are pink and non-injected, sclera clear OROPHARYNX:no exudate, no erythema and lips, buccal mucosa, and tongue normal  NECK: supple, no JVD, thyroid normal size, non-tender, without nodularity LYMPH:  no palpable lymphadenopathy in the cervical, axillary or inguinal LUNGS: clear to auscultation with normal respiratory effort HEART: regular rate & rhythm,  no murmurs and no lower extremity edema ABDOMEN: abdomen soft, non-tender, normoactive bowel sounds  Musculoskeletal: no cyanosis of digits and no clubbing  PSYCH: alert & oriented x 3 with fluent speech NEURO: no focal motor/sensory deficits  LABORATORY DATA:  I have reviewed the data as listed . CBC Latest Ref Rng & Units 11/07/2016 10/09/2016 08/29/2016  WBC 3.9 - 10.3 10e3/uL 4.5 3.9 3.6(L)  Hemoglobin 11.6 - 15.9 g/dL 12.2 13.0 13.0  Hematocrit 34.8 - 46.6 % 37.1 38.7 39.3  Platelets 145 - 400 10e3/uL 253 258 268   . CBC    Component Value Date/Time   WBC 4.5 11/07/2016 1536   WBC 2.3 (L) 01/28/2016 0720   RBC 4.02 11/07/2016 1536   RBC 3.79 (L) 01/28/2016 0720   HGB 12.2 11/07/2016 1536   HCT 37.1 11/07/2016 1536   PLT 253 11/07/2016 1536   MCV 92.3 11/07/2016 1536   MCH 30.3 11/07/2016 1536   MCH 30.6 01/28/2016 0720   MCHC 32.9 11/07/2016 1536   MCHC 32.2 01/28/2016 0720   RDW 15.8 (H) 11/07/2016 1536   LYMPHSABS 1.1 11/07/2016 1536   MONOABS 0.6 11/07/2016 1536   EOSABS 0.2 11/07/2016 1536   BASOSABS 0.0 11/07/2016 1536     . CMP Latest Ref Rng & Units 11/07/2016 10/09/2016 10/09/2016  Glucose 70 - 140 mg/dl 98 88 -  BUN 7.0 - 26.0 mg/dL 14.6 14.4 -  Creatinine 0.6 - 1.1 mg/dL 0.8 0.8 -  Sodium 136 - 145 mEq/L 139 140 -  Potassium 3.5 - 5.1 mEq/L 4.0 4.2 -  Chloride 101 - 111 mmol/L - - -  CO2 22 - 29 mEq/L 24 26 -  Calcium 8.4 - 10.4 mg/dL 9.3 9.9  -  Total Protein 6.4 - 8.3 g/dL 7.0 7.4 6.8  Total Bilirubin 0.20 - 1.20 mg/dL 0.72 0.99 -  Alkaline Phos 40 - 150 U/L 79 78 -  AST 5 - 34 U/L 16 19 -  ALT 0 - 55 U/L 17 25 -         RADIOGRAPHIC STUDIES: I have personally reviewed the radiological images as listed and agreed with the findings in the report. No results found.  ASSESSMENT & PLAN:    Very pleasant 61 year old Caucasian female with  1) R-ISS Stage II IgA lambda multiple myeloma with standard risk cytogenetics t (  11;14). PET/CT shows evidence of lytic bone lesions in the calvarium, iliac bones  and T7. No hypercalcemia or renal failure at this time. Pretreatment M spike of 5 in 2 monoclonal bands. BM bx with 69% plasma cells with Lambda light chain restriction Patient is status post cycle 1 of Vd and 5 cycles of VRd and has she tolerated well other than some grade 1 rash. Also noted to have fevers from Velcade which are controlled with naproxen.   S/p 6 cycles of VRd- VGPR pre-transplant S[facility] Patient is about Day 60 after Auto HSCT. [facility] M spike of 0.07 with IFE IgG kappa band and very faint IgA lambda Repeat myeloma panel on 07/04/2016 showed no M spike and normal serum free light chains Rpt Myeloma panel 10/09/2016 -- no M protein and IFE neg and nl K/L SFC ratio. #2 anemia due to multiple myeloma and treatment medications- resolved #3 fatigue due to multiple myeloma and Anemia - resolved #4 multiple bone metastases from multiple myeloma.  Plan - Patient is doing well with stable labs.  patient is tolerating maintenance Revlimid to 15 mg po daily and we shall continue this. -continue ASA daily for VTE prophylaxis while on Revlimid -Continue acyclovir VZV prophylaxis for 1 year post-transplant. -on Zometa q8weeks. - Prevnar done last visit -Pneumovax on next clinic visit -counseled on infection prevention  strategies. #5 Patient notes some cognitive challenges with multitasking at work with her intense job. This has been present since HDT-Auto HSCT. No acute change with increased dose of Revlimid -she is working on consideration of possibly applying for occupation disability.  RTC with Dr KIrene Limboin 2 months with labs Continue zometa q8weeks  I spent 20 minutes counseling the patient face to face. The total time spent in the appointment was 25 minutes and more than 50% was on counseling and direct patient cares.    GSullivan LoneMD MCudahyAAHIVMS SAlexander HospitalCTrident [facility] Surgery Center LPHematology/Oncology Physician [facility]Select [facility] (Office):       [phone](Work cell):  [phone](Fax):           [phone]

## 2016-11-11 NOTE — Telephone Encounter (Signed)
S/w pt, gave appt for 4/17 @ 3.30pm.

## 2016-12-04 ENCOUNTER — Other Ambulatory Visit: Payer: Self-pay | Admitting: *Deleted

## 2016-12-04 DIAGNOSIS — C9000 Multiple myeloma not having achieved remission: Secondary | ICD-10-CM

## 2016-12-04 MED ORDER — LENALIDOMIDE 15 MG PO CAPS: 15.0000 mg | ORAL_CAPSULE | Freq: Every day | ORAL | 0 refills | Status: DC

## 2016-12-25 ENCOUNTER — Telehealth: Payer: Self-pay | Admitting: *Deleted

## 2016-12-25 NOTE — Telephone Encounter (Signed)
"  I need to talk with Dr.Kale's nurse.  I need an email to send my insurance card information to.  I've changed insurance to [facility].  I have eighteen days of Revlimid left.  The nurse will need to begin the order process with a new Specialty Pharmacy.  I do not know what pharmacy needs to be used."  This nurse asked what pharmacy is on the bottom right corner of her insurance card ("[facility] on the card").    Instructed her to send front and back images of her card in a MyChart message.  My chart allows attach an image or video with a message to provider.  On the bottom right corner of the screen, click this option to upload her picture of insurance card front and back for the collaborative nurse to print to be scanned in EPIC. No further questions.  Call transferred to extension [phone] per patient's request.  Added [facility] to preferred Pharmacy list.

## 2016-12-26 NOTE — Telephone Encounter (Signed)
Information was forwarded to Mosetta Pigeon, [facility] for authorization

## 2017-01-02 ENCOUNTER — Telehealth: Payer: Self-pay | Admitting: *Deleted

## 2017-01-02 NOTE — Telephone Encounter (Signed)
Pt called regarding status of prior authorization for Revlimid with new insurance.  Per Mosetta Pigeon, RPh, pt should call [employer] with new insurance information.  This RN instructed pt to call [facility]olog[employer] to initiate prior auth.  Per pt, united [employer] does not use biologics, and she was told that RX would have to go through new specialty pharmacy.  This RN told pt that this would be reviewed with oral chemo RPh.  Pt thankful for call/update.

## 2017-01-03 ENCOUNTER — Other Ambulatory Visit: Payer: Self-pay | Admitting: *Deleted

## 2017-01-03 DIAGNOSIS — C9000 Multiple myeloma not having achieved remission: Secondary | ICD-10-CM

## 2017-01-03 MED ORDER — LENALIDOMIDE 15 MG PO CAPS: 15.0000 mg | ORAL_CAPSULE | Freq: Every day | ORAL | 0 refills | Status: DC

## 2017-01-05 ENCOUNTER — Telehealth: Payer: Self-pay | Admitting: Pharmacist

## 2017-01-05 NOTE — Telephone Encounter (Signed)
Oral Chemotherapy Pharmacist Encounter  Oral oncology Clinic was informed patient had switched prescription insurance providers and would need assistance obtaining a new prior authorization for Revlimid. Patient has been filling her Revlimid through [place], and we also need to know where to send the patient's next Revlimid prescription.  PA submitted on CoverMyMeds Status is denied due to a plan limit exception, they will only approve 21 capsules per fill Patient takes 1 capsule once daily, so this plan limit will need to be appealed.  I called member services at [phone] to get instructions for appealing this PA denial. We will need to include PA determination, medical records, letter of appeal from MD, any guidelines supporting the use of Revlimid daily, and will also include package insert as Revlimid is being used as it is indicated by the FDA. Patient will have to provide the office consent to appeal on her behalf either by calling member services [phone], choose "care notification") and requesting authorization for the use of disclosure of information form, or we can write a consent and have the patient sign it. Once appeal packet complete, we will fax it to the Appeal Department at fax: [phone].  I called and discussed next steps with patient. She will meet me in the lobby of [place] today (4/13) at 4:50pm to sign consent. The rest of the appeal packet will be completed on Monday 4/16.  Patient understands and is in agreement with above plan. Patient noted she will take her last Revlimid capsule on Thursday 4/19.  Johny Drilling, PharmD, BCPS, BCOP 01/05/2017  3:30 PM Oral Oncology Clinic [phone]

## 2017-01-09 ENCOUNTER — Encounter: Payer: Self-pay | Admitting: Hematology

## 2017-01-09 ENCOUNTER — Ambulatory Visit (HOSPITAL_BASED_OUTPATIENT_CLINIC_OR_DEPARTMENT_OTHER): Payer: 59 | Admitting: Hematology

## 2017-01-09 ENCOUNTER — Other Ambulatory Visit (HOSPITAL_BASED_OUTPATIENT_CLINIC_OR_DEPARTMENT_OTHER): Payer: 59

## 2017-01-09 ENCOUNTER — Other Ambulatory Visit: Payer: Self-pay | Admitting: *Deleted

## 2017-01-09 VITALS — BP 110/65 | HR 80 | Temp 97.9°F | Resp 18 | Ht 68.0 in | Wt 133.7 lb

## 2017-01-09 DIAGNOSIS — C9001 Multiple myeloma in remission: Secondary | ICD-10-CM | POA: Diagnosis not present

## 2017-01-09 DIAGNOSIS — R252 Cramp and spasm: Secondary | ICD-10-CM

## 2017-01-09 DIAGNOSIS — C9000 Multiple myeloma not having achieved remission: Secondary | ICD-10-CM

## 2017-01-09 LAB — COMPREHENSIVE METABOLIC PANEL
ALT: 21 U/L (ref 0–55)
AST: 14 U/L (ref 5–34)
Albumin: 4.1 g/dL (ref 3.5–5.0)
Alkaline Phosphatase: 80 U/L (ref 40–150)
Anion Gap: 12 mEq/L — ABNORMAL HIGH (ref 3–11)
BUN: 18.2 mg/dL (ref 7.0–26.0)
CALCIUM: 9.9 mg/dL (ref 8.4–10.4)
CHLORIDE: 103 meq/L (ref 98–109)
CO2: 25 meq/L (ref 22–29)
Creatinine: 0.8 mg/dL (ref 0.6–1.1)
EGFR: 74 mL/min/{1.73_m2} — ABNORMAL LOW (ref >90)
Glucose: 80 mg/dl (ref 70–140)
POTASSIUM: 4 meq/L (ref 3.5–5.1)
Sodium: 140 mEq/L (ref 136–145)
Total Bilirubin: 0.62 mg/dL (ref 0.20–1.20)
Total Protein: 8.1 g/dL (ref 6.4–8.3)

## 2017-01-09 LAB — CBC & DIFF AND RETIC
BASO%: 1.7 % (ref 0.0–2.0)
BASOS ABS: 0.1 10*3/uL (ref 0.0–0.1)
EOS%: 4 % (ref 0.0–7.0)
Eosinophils Absolute: 0.1 10*3/uL (ref 0.0–0.5)
HEMATOCRIT: 40 % (ref 34.8–46.6)
HGB: 13.3 g/dL (ref 11.6–15.9)
Immature Retic Fract: 3.5 % (ref 1.60–10.00)
LYMPH#: 1 10*3/uL (ref 0.9–3.3)
LYMPH%: 27.3 % (ref 14.0–49.7)
MCH: 30.9 pg (ref 25.1–34.0)
MCHC: 33.3 g/dL (ref 31.5–36.0)
MCV: 92.8 fL (ref 79.5–101.0)
MONO#: 0.6 10*3/uL (ref 0.1–0.9)
MONO%: 17 % — AB (ref 0.0–14.0)
NEUT#: 1.7 10*3/uL (ref 1.5–6.5)
NEUT%: 50 % (ref 38.4–76.8)
Platelets: 300 10*3/uL (ref 145–400)
RBC: 4.31 10*6/uL (ref 3.70–5.45)
RDW: 15.2 % — AB (ref 11.2–14.5)
RETIC %: 1.37 % (ref 0.70–2.10)
RETIC CT ABS: 59.05 10*3/uL (ref 33.70–90.70)
WBC: 3.5 10*3/uL — AB (ref 3.9–10.3)

## 2017-01-09 MED ORDER — PNEUMOCOCCAL VAC POLYVALENT 25 MCG/0.5ML IJ INJ: 0.5000 mL | INJECTION | Freq: Once | INTRAMUSCULAR | Status: DC

## 2017-01-09 MED ORDER — LENALIDOMIDE 15 MG PO CAPS: 15.0000 mg | ORAL_CAPSULE | Freq: Every day | ORAL | 0 refills | Status: DC

## 2017-01-09 NOTE — Progress Notes (Signed)
Paula Johnson    HEMATOLOGY/ONCOLOGY CLINIC NOTE  Date of Service: .01/09/2017  Patient Care Team: [name], MD as PCP - General (Family Medicine)  CHIEF COMPLAINTS/PURPOSE OF CONSULTATION:  Follow up for multiple myeloma  DIAGNOSIS  IgA lambda R-ISS stage II multiple myeloma with innumerable lytic lesions in the calvarium, lesion in T7 vertebra and multiple lesions in the pelvis.  No significant bone pain at this time. Diagnosed in January 2017.  Current treatment  -Revlimid maintenance 7m po daily  Previous Treatment  Status post Vd x 1 cycle VRd x 6 cycles  HD Melphalan 2063mm2 on 03/22/2016 Autologous HSCT on 03/23/2016 with Dr GaSamule Ohmt [facility] 6.4 x10^6/kg)    HISTORY OF PRESENTING ILLNESS: Please see my initial consultation for details of her initial presentation.  INTERVAL HISTORY  Paula Johnson here for her scheduled followup for multiple myeloma. She continues to tolerate her Revlimid well with no acute concerns. She notes some intermittent muscle cramps. Notes that she could probably hydrate herself better and was recommended to drink at least 48-64 ounces of water daily. Potassium levels are okay. She could use over-the-counter slow magnesium to see this helps. She has been trying acupuncture. We discussed possibly cutting down the dose of Revlimid to 10 mg by mouth daily but she wants to hold off on dose reduction at this time which is not unreasonable. She notes that she is back to working with [facility] and notes that she is much happier with this. No other acute new symptoms.  MEDICAL HISTORY:  Past Medical History:  Diagnosis Date  . [place]   squamous cell carcinoma of thymus  . Fibroid    reason for Hysterectomy  . Multiple myeloma (HCSawyerville  . Multiple myeloma (HCWeld  . Pain aggravated by activities of daily living    states she pulled something at right sternum in dec 2016  . PONV (postoperative nausea and vomiting)    thinks  morphine caused nausea  . Prediabetes   . Urinary incontinence     SURGICAL HISTORY: Past Surgical History:  Procedure Laterality Date  . ABDOMINAL HYSTERECTOMY  1997   TAH--ovaries remain--Dr. ThNewton Pigg. THYMECTOMY  1999    SOCIAL HISTORY: Social History   Social History  . Marital status: Widowed    Spouse name: N/A  . Number of children: N/A  . Years of education: N/A   Occupational History  . Not on file.   Social History Main Topics  . Smoking status: Never Smoker  . Smokeless tobacco: Never Used  . Alcohol use No  . Drug use: No  . Sexual activity: Yes    Partners: Male    Birth control/ protection: Surgical     Comment: TAH--ovaries remain   Other Topics Concern  . Not on file   Social History Narrative  . No narrative on file    FAMILY HISTORY: Family History  Problem Relation Age of Onset  . COPD Father     dec age 55/s-smoked  . Diabetes Brother   . Other Brother     committed suicide age 63. Hyperlipidemia Mother   . Diabetes Maternal Grandmother     ALLERGIES:  is allergic to penicillins and ampicillin.  MEDICATIONS:  Current Outpatient Prescriptions  Medication Sig Dispense Refill  . acyclovir (ZOVIRAX) 400 MG tablet TAKE 1 TABLET (400 MG TOTAL) BY MOUTH 2 (TWO) TIMES DAILY. 180 tablet 0  . aspirin EC 81 MG tablet Take  81 mg by mouth daily.    . cholecalciferol (VITAMIN D) 1000 units tablet Take 2,000 Units by mouth daily.    Paula Johnson lenalidomide (REVLIMID) 15 MG capsule Take 1 capsule (15 mg total) by mouth daily. Authorization # [id number] 01/03/17 (Valid for 30 days) 28 capsule 0  . senna-docusate (SENNA S) 8.6-50 MG tablet Take 2 tablets by mouth at bedtime. (Patient taking differently: Take 2 tablets by mouth 2 (two) times daily at 8 am and 10 pm. ) 60 tablet 1   No current facility-administered medications for this visit.     REVIEW OF SYSTEMS:    10 Point review of Systems was done is negative except as noted above.  PHYSICAL  EXAMINATION: ECOG PERFORMANCE STATUS: 1 - Symptomatic but completely ambulatory  .VSS as per EPIC GENERAL:alert, in no acute distress and comfortable SKIN: skin color, texture, turgor are normal, no rashes or significant lesions EYES: normal, conjunctiva are pink and non-injected, sclera clear OROPHARYNX:no exudate, no erythema and lips, buccal mucosa, and tongue normal  NECK: supple, no JVD, thyroid normal size, non-tender, without nodularity LYMPH:  no palpable lymphadenopathy in the cervical, axillary or inguinal LUNGS: clear to auscultation with normal respiratory effort HEART: regular rate & rhythm,  no murmurs and no lower extremity edema ABDOMEN: abdomen soft, non-tender, normoactive bowel sounds  Musculoskeletal: no cyanosis of digits and no clubbing  PSYCH: alert & oriented x 3 with fluent speech NEURO: no focal motor/sensory deficits  LABORATORY DATA:  I have reviewed the data as listed . CBC Latest Ref Rng & Units 01/09/2017 11/07/2016 10/09/2016  WBC 3.9 - 10.3 10e3/uL 3.5(L) 4.5 3.9  Hemoglobin 11.6 - 15.9 g/dL 13.3 12.2 13.0  Hematocrit 34.8 - 46.6 % 40.0 37.1 38.7  Platelets 145 - 400 10e3/uL 300 253 258   . CBC    Component Value Date/Time   WBC 3.5 (L) 01/09/2017 1540   WBC 2.3 (L) 01/28/2016 0720   RBC 4.31 01/09/2017 1540   RBC 3.79 (L) 01/28/2016 0720   HGB 13.3 01/09/2017 1540   HCT 40.0 01/09/2017 1540   PLT 300 01/09/2017 1540   MCV 92.8 01/09/2017 1540   MCH 30.9 01/09/2017 1540   MCH 30.6 01/28/2016 0720   MCHC 33.3 01/09/2017 1540   MCHC 32.2 01/28/2016 0720   RDW 15.2 (H) 01/09/2017 1540   LYMPHSABS 1.0 01/09/2017 1540   MONOABS 0.6 01/09/2017 1540   EOSABS 0.1 01/09/2017 1540   BASOSABS 0.1 01/09/2017 1540     . CMP Latest Ref Rng & Units 01/09/2017 11/07/2016 10/09/2016  Glucose 70 - 140 mg/dl 80 98 88  BUN 7.0 - 26.0 mg/dL 18.2 14.6 14.4  Creatinine 0.6 - 1.1 mg/dL 0.8 0.8 0.8  Sodium 136 - 145 mEq/L 140 139 140  Potassium 3.5 - 5.1  mEq/L 4.0 4.0 4.2  Chloride 101 - 111 mmol/L - - -  CO2 22 - 29 mEq/L _0 Calcium 8.4 - 10.4 mg/dL 9.9 9.3 9.9  Total Protein 6.4 - 8.3 g/dL 8.1 7.0 7.4  Total Bilirubin 0.20 - 1.20 mg/dL 0.62 0.72 0.99  Alkaline Phos 40 - 150 U/L 80 79 78  AST 5 - 34 U/L _1 ALT 0 - 55 U/L _2 RADIOGRAPHIC STUDIES: I have personally reviewed the radiological images as listed and agreed with the findings in the report. No results found.  ASSESSMENT & PLAN:    Very pleasant 60 year old Caucasian female  with  1) R-ISS Stage II IgA lambda multiple myeloma with standard risk cytogenetics t (11;14) currently in remission.  ONCOLOGIC HISTORY On diagnosis PET/CT showed evidence of lytic bone lesions in the calvarium, iliac bones  and T7. No hypercalcemia or renal failure at this time. Pretreatment M spike of 5 in 2 monoclonal bands. BM bx with 69% plasma cells with Lambda light chain restriction Patient is status post cycle 1 of Vd and 5 cycles of VRd and has she tolerated well other than some grade 1 rash. Also noted to have fevers from Velcade which are controlled with naproxen.  S/p 6 cycles of VRd- VGPR pre-transplant [facility] 257[facility]  Myeloma panel 10/09/2016 -- no M protein and IFE neg and nl K/L SFC ratio. #2 anemia due to multiple myeloma and treatment medications- resolved #3 fatigue due to multiple myeloma and Anemia - resolved #4 h/o multiple bone metastases from multiple myeloma #5 Intermittent Muscle cramps - could be from Revlimid/dehydration. Plan - Patient is doing well with stable labs. - patient is tolerating maintenance Revlimid to 15 mg po daily and we shall continue this. -She is having some muscle cramps and was recommended to increase water intake and maintain water intake of at least 48-64 ounces daily. Also counseled to consider slow magnesium over-the-counter to see if this helps with muscle cramps. -If  bothersome or worsening we'll back off on the dose of Revlimid. This was discussed with the patient and she prefers to continue the current dose currently. -continue ASA daily for VTE prophylaxis while on Revlimid -Continue acyclovir VZV prophylaxis for 1 year post-transplant. -Pneumovax wil order and have patient come by to do this sometime this week -counseled on infection prevention strategies. -Discussed pros and cons of continued Zometa in the setting of remission of multiple myeloma. At this point the patient has no bone pains. Unclear advantage of continued bisphosphonates in the absence of evidence of active bone disease. Will hold off at this time. -[facility] mid June or July 2018 and might get repeat bone marrow biopsy and possibly PET scan. -She'll follow-up on the myeloma panel and serum free Light chain results from today.  RTC with Dr KIrene Limboin 2 months with labs   I spent 20 minutes counseling the patient face to face. The total time spent in the appointment was 25 minutes and more than 50% was on counseling and direct patient cares.    GSullivan LoneMD MMartin's AdditionsAAHIVMS SSt. James HospitalCWeiser Memorial HospitalHematology/Oncology Physician CPaviliion Surgery [facility] (Office):       [phone](Work cell):  [phone](Fax):           [phone]

## 2017-01-10 ENCOUNTER — Other Ambulatory Visit: Payer: Self-pay | Admitting: *Deleted

## 2017-01-10 LAB — KAPPA/LAMBDA LIGHT CHAINS
Ig Kappa Free Light Chain: 31 mg/L — ABNORMAL HIGH (ref 3.3–19.4)
Ig Lambda Free Light Chain: 34.9 mg/L — ABNORMAL HIGH (ref 5.7–26.3)
KAPPA/LAMBDA FLC RATIO: 0.89 (ref 0.26–1.65)

## 2017-01-11 ENCOUNTER — Other Ambulatory Visit: Payer: Self-pay | Admitting: *Deleted

## 2017-01-11 ENCOUNTER — Telehealth: Payer: Self-pay | Admitting: Hematology

## 2017-01-11 DIAGNOSIS — C9000 Multiple myeloma not having achieved remission: Secondary | ICD-10-CM

## 2017-01-11 NOTE — Telephone Encounter (Signed)
Oral Chemotherapy Pharmacist Encounter  Revlimid prescription sent to [facility] in [place], [place] They are able to fill patient's Revlimid for a max of 21 capsules / fill per patient's insurance. They are able to refill this prescription every 21 days so that patient may remain on daily dose with insurance support.  [facility] was able to enroll patient for [employer] copay card so patient's Revlimid copayment is $25 / fill.  Patient notified on 01/10/17. She would still like for office to appeal quantity limit so that she pays her copayment every 28 days instead of every 21 days.  Oral Oncology Clinic will continue appeal process.  Johny Drilling, PharmD, BCPS, BCOP 01/11/2017  2:21 PM Oral Oncology Clinic [phone]

## 2017-01-11 NOTE — Telephone Encounter (Signed)
Scheduled appt per sch message from [facility]- patient is aware of appt date and time.

## 2017-01-12 ENCOUNTER — Telehealth: Payer: Self-pay | Admitting: *Deleted

## 2017-01-12 LAB — MULTIPLE MYELOMA PANEL, SERUM
ALBUMIN/GLOB SERPL: 1.3 (ref 0.7–1.7)
ALPHA2 GLOB SERPL ELPH-MCNC: 0.7 g/dL (ref 0.4–1.0)
Albumin SerPl Elph-Mcnc: 4 g/dL (ref 2.9–4.4)
Alpha 1: 0.2 g/dL (ref 0.0–0.4)
B-GLOBULIN SERPL ELPH-MCNC: 1.1 g/dL (ref 0.7–1.3)
GAMMA GLOB SERPL ELPH-MCNC: 1.1 g/dL (ref 0.4–1.8)
Globulin, Total: 3.1 g/dL (ref 2.2–3.9)
IGM (IMMUNOGLOBIN M), SRM: 47 mg/dL (ref 26–217)
IgA, Qn, Serum: 377 mg/dL — ABNORMAL HIGH (ref 87–352)
Total Protein: 7.1 g/dL (ref 6.0–8.5)

## 2017-01-12 NOTE — Telephone Encounter (Signed)
Call from pt reporting "low grade fever". Highest temp 99.1. Pt reports she had a cold last week and still has a deep cough. No other signs of infection. Reports some mild vaginal bleeding from pessary but this is unchanged. Pt plans to go to the beach this weekend. Informed her it is OK to take OTC cold remedy as needed. Call office if temp is greater than 100.5. May need to go to urgent care if this occurs. She voiced understanding.

## 2017-01-15 ENCOUNTER — Telehealth: Payer: Self-pay | Admitting: Obstetrics and Gynecology

## 2017-01-15 NOTE — Telephone Encounter (Signed)
Spoke with patient. Patient states that she has had light bleeding with her pessary before. Over the last few days she has been having an increase in dark brown discharge. Denies any pain, red blood, odor to discharge, difficulty urinating, fever, or chills. Patient is requesting a late afternoon appointment for evaluation. Offered 3:30 pm appointment on 4/25, 4/26, and 4/27. Patient declines all appointments due to her work schedule. Requesting an appointment next week. Appointment scheduled for 01/22/2017 at 3:30 pm with Dr.Silva. Aware if symptoms worsen or develops new symptoms she will need to be seen for earlier evaluation. Patient is agreeable.  Routing to provider for final review. Patient agreeable to disposition. Will close encounter.

## 2017-01-15 NOTE — Telephone Encounter (Signed)
Patient says she is having some issues with her pessary.  States there is more brown discharge than normal

## 2017-01-16 ENCOUNTER — Other Ambulatory Visit: Payer: Self-pay | Admitting: *Deleted

## 2017-01-17 ENCOUNTER — Ambulatory Visit (HOSPITAL_BASED_OUTPATIENT_CLINIC_OR_DEPARTMENT_OTHER): Payer: 59

## 2017-01-17 ENCOUNTER — Encounter: Payer: Self-pay | Admitting: Obstetrics and Gynecology

## 2017-01-17 ENCOUNTER — Ambulatory Visit (INDEPENDENT_AMBULATORY_CARE_PROVIDER_SITE_OTHER): Payer: 59 | Admitting: Obstetrics and Gynecology

## 2017-01-17 VITALS — BP 119/69 | HR 67 | Temp 97.8°F | Resp 20

## 2017-01-17 VITALS — BP 104/62 | HR 70 | Ht 67.0 in | Wt 131.6 lb

## 2017-01-17 DIAGNOSIS — N765 Ulceration of vagina: Secondary | ICD-10-CM | POA: Diagnosis not present

## 2017-01-17 DIAGNOSIS — N993 Prolapse of vaginal vault after hysterectomy: Secondary | ICD-10-CM | POA: Diagnosis not present

## 2017-01-17 DIAGNOSIS — N898 Other specified noninflammatory disorders of vagina: Secondary | ICD-10-CM

## 2017-01-17 DIAGNOSIS — C9000 Multiple myeloma not having achieved remission: Secondary | ICD-10-CM

## 2017-01-17 DIAGNOSIS — Z23 Encounter for immunization: Secondary | ICD-10-CM | POA: Diagnosis not present

## 2017-01-17 DIAGNOSIS — K219 Gastro-esophageal reflux disease without esophagitis: Secondary | ICD-10-CM

## 2017-01-17 HISTORY — DX: Gastro-esophageal reflux disease without esophagitis: K21.9

## 2017-01-17 MED ORDER — PNEUMOCOCCAL VAC POLYVALENT 25 MCG/0.5ML IJ INJ
0.5000 mL | INJECTION | Freq: Once | INTRAMUSCULAR | Status: AC
  Administered 2017-01-17: 0.5 mL via INTRAMUSCULAR
  Filled 2017-01-17: qty 0.5

## 2017-01-17 NOTE — Patient Instructions (Signed)

## 2017-01-17 NOTE — Progress Notes (Signed)
GYNECOLOGY  VISIT   HPI: 61 y.o.   Widowed  Caucasian  female   G2P2002 with No LMP recorded. Patient has had a hysterectomy.   here for brownish vaginal discharge, no odor or itching.   Denies any frank bleeding.  No pain.   Has prolapse and uses a pessary.  Currently using the incontinence dish to control her incontinence.   Was told not to use vaginal estrogens following her diagnosis of multiple myeloma and hx bone marrow transplant. Told she is at risk of blood clots.  Chronic constipation.   GYNECOLOGIC HISTORY: No LMP recorded. Patient has had a hysterectomy. Contraception:  Hysterectomy Menopausal hormone therapy:  none Last mammogram: 06-23-16 Density B/Neg/Birads1:TBC Last pap smear: 2015 Neg        OB History    Gravida Para Term Preterm AB Living   2 2 2     2    SAB TAB Ectopic Multiple Live Births                     Patient Active Problem List   Diagnosis Date Noted  . Near syncope 01/04/2016  . Peripheral edema 12/19/2015  . Cough 11/30/2015  . Fever 11/08/2015  . Hematuria 11/08/2015  . Rash 11/01/2015  . Multiple myeloma ([place]) 10/19/2015  . Metastatic multiple myeloma to bone ([place]) 10/19/2015  . Absolute anemia   . Plasma cell dyscrasia 10/05/2015  . Anemia 09/29/2015  . Hypergammaglobulinemia 09/29/2015  . HYPOXEMIA 12/16/2007  . HYPERLIPIDEMIA 12/04/2007  . DYSPNEA 12/04/2007  . MALIGNANT NEOPLASM OF THYMUS 12/03/2007  . OBSTRUCTIVE SLEEP APNEA 12/03/2007  . RESTLESS LEGS SYNDROME 12/03/2007    Past Medical History:  Diagnosis Date  . Cancer ([place]) 1999   squamous cell carcinoma of thymus  . Fibroid    reason for Hysterectomy  . Multiple myeloma ([place]) 08/2015  . Multiple myeloma ([place])   . Pain aggravated by activities of daily living    states she pulled something at right sternum in dec 2016  . PONV (postoperative nausea and vomiting)    thinks morphine caused nausea  . Prediabetes   . Urinary incontinence     Past Surgical  History:  Procedure Laterality Date  . ABDOMINAL HYSTERECTOMY  1997   TAH--ovaries remain--Dr. Newton Pigg  . THYMECTOMY  1999    Current Outpatient Prescriptions  Medication Sig Dispense Refill  . acyclovir (ZOVIRAX) 400 MG tablet TAKE 1 TABLET (400 MG TOTAL) BY MOUTH 2 (TWO) TIMES DAILY. 180 tablet 0  . aspirin EC 81 MG tablet Take 81 mg by mouth daily.    . cholecalciferol (VITAMIN D) 1000 units tablet Take 2,000 Units by mouth daily.    Marland Kitchen lenalidomide (REVLIMID) 15 MG capsule Take 1 capsule (15 mg total) by mouth daily. Authorization # [id number] 01/03/17 (Valid for 30 days) 28 capsule 0  . Magnesium 400 MG CAPS Take 1 tablet by mouth daily.    Marland Kitchen senna-docusate (SENNA S) 8.6-50 MG tablet Take 2 tablets by mouth at bedtime. (Patient taking differently: Take 2 tablets by mouth 2 (two) times daily at 8 am and 10 pm. ) 60 tablet 1   No current facility-administered medications for this visit.      ALLERGIES: Penicillins and Ampicillin  Family History  Problem Relation Age of Onset  . COPD Father     dec age 79/s-smoked  . Diabetes Brother   . Other Brother     committed suicide age 22  . Hyperlipidemia Mother   .  Diabetes Maternal Grandmother     Social History   Social History  . Marital status: Widowed    Spouse name: N/A  . Number of children: N/A  . Years of education: N/A   Occupational History  . Not on file.   Social History Main Topics  . Smoking status: Never Smoker  . Smokeless tobacco: Never Used  . Alcohol use No  . Drug use: No  . Sexual activity: Yes    Partners: Male    Birth control/ protection: Surgical     Comment: TAH--ovaries remain   Other Topics Concern  . Not on file   Social History Narrative  . No narrative on file    ROS:  Pertinent items are noted in HPI.  PHYSICAL EXAMINATION:    BP 104/62 (BP Location: Right Arm, Patient Position: Sitting, Cuff Size: Normal)   Pulse 70   Ht 5' 7"  (1.702 m)   Wt 131 lb 9.6 oz (59.7 kg)    BMI 20.61 kg/m     General appearance: alert, cooperative and appears stated age    Pelvic: External genitalia:  no lesions              Urethra:  normal appearing urethra with no masses, tenderness or lesions              Bartholins and Skenes: normal                 Vagina:  Significant ulceration of left vaginal side wall - 1 cm with surrounding erythema.  Right vaginal apex with granulation tissue measuriing about 1 cm and on a thin stalk.  Area also surrounded by erythema.  Has vulva prolapse.  Granulation tissue tx with AgNO3 and removed with scissors - discarded.              Cervix:  Absent.                 Bimanual Exam:  Uterus:  Absent.               Adnexa: no mass, fullness, tenderness            Pessary removed, cleansed, and given to patient in a biobag.  Chaperone was present for exam.  ASSESSMENT  Vaginal vault prolapse.  Vaginal ulceration and granulation tissue from pessary use.   PLAN  Will leave pessary out for 3 weeks.  Use vaginal water based lubricant starting in one week.  Follow up in 3 weeks and bring both pessaries for [place] to chose one for her to use.  She has goals for surgical repair, maybe in one year.    An After Visit Summary was printed and given to the patient.  __15____ minutes face to face time of which over 50% was spent in counseling.

## 2017-01-18 ENCOUNTER — Ambulatory Visit: Payer: BLUE CROSS/BLUE SHIELD

## 2017-01-22 ENCOUNTER — Ambulatory Visit: Payer: Self-pay | Admitting: Obstetrics and Gynecology

## 2017-01-25 ENCOUNTER — Other Ambulatory Visit: Payer: Self-pay | Admitting: *Deleted

## 2017-01-25 DIAGNOSIS — C9000 Multiple myeloma not having achieved remission: Secondary | ICD-10-CM

## 2017-01-25 MED ORDER — ACYCLOVIR 400 MG PO TABS: 400.0000 mg | ORAL_TABLET | Freq: Two times a day (BID) | ORAL | 0 refills | Status: DC

## 2017-01-29 ENCOUNTER — Other Ambulatory Visit: Payer: Self-pay | Admitting: Hematology

## 2017-01-29 DIAGNOSIS — C9000 Multiple myeloma not having achieved remission: Secondary | ICD-10-CM

## 2017-01-30 ENCOUNTER — Other Ambulatory Visit: Payer: Self-pay | Admitting: *Deleted

## 2017-01-31 ENCOUNTER — Other Ambulatory Visit: Payer: Self-pay | Admitting: *Deleted

## 2017-01-31 DIAGNOSIS — C9000 Multiple myeloma not having achieved remission: Secondary | ICD-10-CM

## 2017-01-31 MED ORDER — LENALIDOMIDE 15 MG PO CAPS: 15.0000 mg | ORAL_CAPSULE | Freq: Every day | ORAL | 0 refills | Status: DC

## 2017-02-01 ENCOUNTER — Other Ambulatory Visit: Payer: Self-pay | Admitting: *Deleted

## 2017-02-01 DIAGNOSIS — C9000 Multiple myeloma not having achieved remission: Secondary | ICD-10-CM

## 2017-02-01 MED ORDER — LENALIDOMIDE 15 MG PO CAPS
15.0000 mg | ORAL_CAPSULE | Freq: Every day | ORAL | 0 refills | Status: DC
Start: 2017-02-01 — End: 2017-02-20

## 2017-02-01 MED ORDER — LENALIDOMIDE 15 MG PO CAPS: 15.0000 mg | ORAL_CAPSULE | Freq: Every day | ORAL | 0 refills | Status: DC

## 2017-02-05 NOTE — Progress Notes (Signed)
GYNECOLOGY  VISIT   HPI: 61 y.o.   Widowed  Caucasian  female   G2P2002 with No LMP recorded. Patient has had a hysterectomy.   here for 3 week follow up.    Brought her incontinence dish and the ring with support pessary.  Prefers the incontinence dish due to control of urinary incontinence.  Seen on 01/17/17 and had vaginal ulceration from her pessary.  She was told not to use vaginal estrogens due to multiple myeloma and hx bone marrow transplant.   Prolapse has recurred.  Voiding ok.   No bleeding or unusual discharge.   GYNECOLOGIC HISTORY: No LMP recorded. Patient has had a hysterectomy. Contraception:  Hysterectomy Menopausal hormone therapy:  none Last mammogram:06-23-16 Density B/Neg/Birads1:TBC  Last pap smear: 2015 Neg        OB History    Gravida Para Term Preterm AB Living   2 2 2     2    SAB TAB Ectopic Multiple Live Births                     Patient Active Problem List   Diagnosis Date Noted  . Near syncope 01/04/2016  . Peripheral edema 12/19/2015  . Cough 11/30/2015  . Fever 11/08/2015  . Hematuria 11/08/2015  . Rash 11/01/2015  . Multiple myeloma ([place]) 10/19/2015  . Metastatic multiple myeloma to bone ([place]) 10/19/2015  . Absolute anemia   . Plasma cell dyscrasia 10/05/2015  . Anemia 09/29/2015  . Hypergammaglobulinemia 09/29/2015  . HYPOXEMIA 12/16/2007  . HYPERLIPIDEMIA 12/04/2007  . DYSPNEA 12/04/2007  . MALIGNANT NEOPLASM OF THYMUS 12/03/2007  . OBSTRUCTIVE SLEEP APNEA 12/03/2007  . RESTLESS LEGS SYNDROME 12/03/2007    Past Medical History:  Diagnosis Date  . Cancer ([place]) 1999   squamous cell carcinoma of thymus  . Fibroid    reason for Hysterectomy  . Multiple myeloma ([place]) 08/2015  . Multiple myeloma ([place])   . Pain aggravated by activities of daily living    states she pulled something at right sternum in dec 2016  . PONV (postoperative nausea and vomiting)    thinks morphine caused nausea  . Prediabetes   . Urinary  incontinence     Past Surgical History:  Procedure Laterality Date  . ABDOMINAL HYSTERECTOMY  1997   TAH--ovaries remain--Dr. Newton Pigg  . THYMECTOMY  1999    Current Outpatient Prescriptions  Medication Sig Dispense Refill  . acyclovir (ZOVIRAX) 400 MG tablet Take 1 tablet (400 mg total) by mouth 2 (two) times daily. 180 tablet 0  . aspirin EC 81 MG tablet Take 81 mg by mouth daily.    . cholecalciferol (VITAMIN D) 1000 units tablet Take 2,000 Units by mouth daily.    Marland Kitchen lenalidomide (REVLIMID) 15 MG capsule Take 1 capsule (15 mg total) by mouth daily. Authorization # [id number] 01/31/17 21 capsule 0  . Magnesium 400 MG CAPS Take 1 tablet by mouth daily.    Marland Kitchen senna-docusate (SENNA S) 8.6-50 MG tablet Take 2 tablets by mouth at bedtime. (Patient taking differently: Take 2 tablets by mouth 2 (two) times daily at 8 am and 10 pm. ) 60 tablet 1   No current facility-administered medications for this visit.      ALLERGIES: Penicillins and Ampicillin  Family History  Problem Relation Age of Onset  . COPD Father        dec age 67/s-smoked  . Diabetes Brother   . Other Brother        committed  suicide age 65  . Hyperlipidemia Mother   . Diabetes Maternal Grandmother     Social History   Social History  . Marital status: Widowed    Spouse name: N/A  . Number of children: N/A  . Years of education: N/A   Occupational History  . Not on file.   Social History Main Topics  . Smoking status: Never Smoker  . Smokeless tobacco: Never Used  . Alcohol use No  . Drug use: No  . Sexual activity: Yes    Partners: Male    Birth control/ protection: Surgical     Comment: TAH--ovaries remain   Other Topics Concern  . Not on file   Social History Narrative  . No narrative on file    ROS:  Pertinent items are noted in HPI.  PHYSICAL EXAMINATION:    There were no vitals taken for this visit.    General appearance: alert, cooperative and appears stated age   Pelvic: External  genitalia:  no lesions              Urethra:  normal appearing urethra with no masses, tenderness or lesions              Bartholins and Skenes: normal                 Vagina: normal appearing vagina with normal color and discharge, minimal posterior vaginal cuff erythema.              Cervix: absent.                 Bimanual Exam:  Uterus:  Absent.                Adnexa: no mass, fullness, tenderness            Chaperone was present for exam.  ASSESSMENT  Vaginal vault prolapse following hysterectomy.  Vaginal ulceration resolved.   PLAN  Incontinence dish replaced with large amount of Surgilube.  I recommend that she remove the pessary twice a week and use KY jelly for lubrication each time.  Follow up in one month or sooner if needed. If needed after next visit, I will contact her oncologist about the use of vaginal estrogen 0.5 grams once or twice weekly.  The risk of using occasional vaginal estrogen is less than the risk of undergoing a vaginal reconstruction surgery.   An After Visit Summary was printed and given to the patient.  ___15___ minutes face to face time of which over 50% was spent in counseling.

## 2017-02-07 ENCOUNTER — Encounter: Payer: Self-pay | Admitting: Obstetrics and Gynecology

## 2017-02-07 ENCOUNTER — Ambulatory Visit (INDEPENDENT_AMBULATORY_CARE_PROVIDER_SITE_OTHER): Payer: 59 | Admitting: Obstetrics and Gynecology

## 2017-02-07 VITALS — BP 110/70 | HR 76 | Resp 14 | Wt 133.0 lb

## 2017-02-07 DIAGNOSIS — N993 Prolapse of vaginal vault after hysterectomy: Secondary | ICD-10-CM

## 2017-02-15 ENCOUNTER — Other Ambulatory Visit: Payer: Self-pay | Admitting: Hematology

## 2017-02-15 DIAGNOSIS — C9000 Multiple myeloma not having achieved remission: Secondary | ICD-10-CM

## 2017-02-20 ENCOUNTER — Other Ambulatory Visit: Payer: Self-pay | Admitting: *Deleted

## 2017-02-20 DIAGNOSIS — C9000 Multiple myeloma not having achieved remission: Secondary | ICD-10-CM

## 2017-02-20 MED ORDER — LENALIDOMIDE 15 MG PO CAPS
15.0000 mg | ORAL_CAPSULE | Freq: Every day | ORAL | 0 refills | Status: DC
Start: 2017-02-20 — End: 2017-03-04

## 2017-02-28 NOTE — Telephone Encounter (Signed)
Oral Chemotherapy Pharmacist Encounter  Prior authorization appeal for quantity limit exception faxed to [employer] at [phone]. Attempting to obtain 28 Revlimid capsules per 28 days vs approved 21 capsules per 21 days.  Oral oncology Clinic will continue to follow.  Johny Drilling, PharmD, BCPS, BCOP 02/28/2017  12:30 PM Oral Oncology Clinic [phone]

## 2017-03-04 ENCOUNTER — Other Ambulatory Visit: Payer: Self-pay | Admitting: Hematology

## 2017-03-04 DIAGNOSIS — C9000 Multiple myeloma not having achieved remission: Secondary | ICD-10-CM

## 2017-03-12 ENCOUNTER — Other Ambulatory Visit: Payer: BLUE CROSS/BLUE SHIELD

## 2017-03-12 ENCOUNTER — Ambulatory Visit: Payer: BLUE CROSS/BLUE SHIELD | Admitting: Hematology

## 2017-03-15 ENCOUNTER — Encounter: Payer: Self-pay | Admitting: Obstetrics and Gynecology

## 2017-03-15 ENCOUNTER — Ambulatory Visit (INDEPENDENT_AMBULATORY_CARE_PROVIDER_SITE_OTHER): Payer: 59 | Admitting: Obstetrics and Gynecology

## 2017-03-15 VITALS — BP 106/64 | HR 68 | Resp 16 | Ht 67.0 in | Wt 137.0 lb

## 2017-03-15 DIAGNOSIS — N898 Other specified noninflammatory disorders of vagina: Secondary | ICD-10-CM | POA: Diagnosis not present

## 2017-03-15 DIAGNOSIS — N993 Prolapse of vaginal vault after hysterectomy: Secondary | ICD-10-CM

## 2017-03-15 NOTE — Progress Notes (Signed)
GYNECOLOGY  VISIT   HPI: 61 y.o.   Married Caucasian  female   G2P2002 with No LMP recorded. Patient has had a hysterectomy.   here for pessary f/u.  The incontinence dish placed on 02/07/17 did not hold well.  She switched back to her regular pessary ring with support.  Removing it twice a week.  Incontinence is a little worse.   Seeing oncology next week for her multiple myeloma.  GYNECOLOGIC HISTORY: No LMP recorded. Patient has had a hysterectomy. Contraception:  Hysterectomy Menopausal hormone therapy:  none Last mammogram:  06-23-16 Density B/Neg/Birads1:TBC  Last pap smear:   2015 Neg        OB History    Gravida Para Term Preterm AB Living   2 2 2     2    SAB TAB Ectopic Multiple Live Births                     Patient Active Problem List   Diagnosis Date Noted  . Near syncope 01/04/2016  . Peripheral edema 12/19/2015  . Cough 11/30/2015  . Fever 11/08/2015  . Hematuria 11/08/2015  . Rash 11/01/2015  . Multiple myeloma ([place]) 10/19/2015  . Metastatic multiple myeloma to bone ([place]) 10/19/2015  . Absolute anemia   . Plasma cell dyscrasia 10/05/2015  . Anemia 09/29/2015  . Hypergammaglobulinemia 09/29/2015  . HYPOXEMIA 12/16/2007  . HYPERLIPIDEMIA 12/04/2007  . DYSPNEA 12/04/2007  . MALIGNANT NEOPLASM OF THYMUS 12/03/2007  . OBSTRUCTIVE SLEEP APNEA 12/03/2007  . RESTLESS LEGS SYNDROME 12/03/2007    Past Medical History:  Diagnosis Date  . Cancer ([place]) 1999   squamous cell carcinoma of thymus  . Fibroid    reason for Hysterectomy  . Multiple myeloma ([place]) 08/2015  . Multiple myeloma ([place])   . Pain aggravated by activities of daily living    states she pulled something at right sternum in dec 2016  . PONV (postoperative nausea and vomiting)    thinks morphine caused nausea  . Prediabetes   . Urinary incontinence     Past Surgical History:  Procedure Laterality Date  . ABDOMINAL HYSTERECTOMY  1997   TAH--ovaries remain--Dr. Newton Pigg  .  THYMECTOMY  1999    Current Outpatient Prescriptions  Medication Sig Dispense Refill  . acyclovir (ZOVIRAX) 400 MG tablet Take by mouth.    Marland Kitchen aspirin EC 81 MG tablet Take 81 mg by mouth daily.    . Bisacodyl (DULCOLAX PO) Take by mouth. 4 daily    . cholecalciferol (VITAMIN D) 1000 units tablet Take 2,000 Units by mouth daily.    . fluticasone (FLONASE) 50 MCG/ACT nasal spray Place into the nose.    . Magnesium 400 MG CAPS Take 1 tablet by mouth daily.    . Polyethylene Glycol 3350 (MIRALAX PO) Take by mouth.    . REVLIMID 15 MG capsule TAKE 1 CAPSULE BY MOUTH ONCE DAILY 21 capsule 0   No current facility-administered medications for this visit.      ALLERGIES: Penicillins and Ampicillin  Family History  Problem Relation Age of Onset  . COPD Father        dec age 29/s-smoked  . Diabetes Brother   . Other Brother        committed suicide age 37  . Hyperlipidemia Mother   . Diabetes Maternal Grandmother     Social History   Social History  . Marital status: Married    Spouse name: N/A  . Number of children: N/A  .  Years of education: N/A   Occupational History  . Not on file.   Social History Main Topics  . Smoking status: Never Smoker  . Smokeless tobacco: Never Used  . Alcohol use No  . Drug use: No  . Sexual activity: No     Comment: TAH--ovaries remain   Other Topics Concern  . Not on file   Social History Narrative  . No narrative on file    ROS:  Pertinent items are noted in HPI.  PHYSICAL EXAMINATION:    BP 106/64   Pulse 68   Resp 16   Ht 5' 7"  (1.702 m)   Wt 137 lb (62.1 kg)   BMI 21.46 kg/m     General appearance: alert, cooperative and appears stated age    Pelvic: External genitalia:  no lesions              Urethra:  normal appearing urethra with no masses, tenderness or lesions              Bartholins and Skenes: normal                 Vagina: normal appearing vagina with normal color and discharge, no lesions                 Cervix: absent.  Vaginal cuff with stringy granulation tissue treated with AgNO3.                Bimanual Exam:  Uterus:   absent              Adnexa: no mass, fullness, tenderness                Ring with support pessary removed, cleansed, and replaced.   Chaperone was present for exam.  ASSESSMENT  Vaginal vault prolapse following hysterectomy.  Vaginal granulation tissue from using pessary.  Multiple myeloma.  PLAN  Will leave pessary out for one week.  While the patient and I were in the exam room, I send a staff message to the patient's oncologist, [name] to see about the possibility of using local vaginal estrogen cream as this is low hormonal exposure.  Final plan to follow pending his response.   An After Visit Summary was printed and given to the patient.  __15____ minutes face to face time of which over 50% was spent in counseling.

## 2017-03-15 NOTE — Patient Instructions (Signed)
Please leave your pessary out for one week.

## 2017-03-19 ENCOUNTER — Ambulatory Visit (HOSPITAL_BASED_OUTPATIENT_CLINIC_OR_DEPARTMENT_OTHER): Payer: 59 | Admitting: Hematology

## 2017-03-19 ENCOUNTER — Encounter: Payer: Self-pay | Admitting: Hematology

## 2017-03-19 ENCOUNTER — Other Ambulatory Visit (HOSPITAL_BASED_OUTPATIENT_CLINIC_OR_DEPARTMENT_OTHER): Payer: 59

## 2017-03-19 VITALS — BP 110/72 | HR 72 | Temp 97.9°F | Resp 18 | Ht 67.0 in | Wt 134.5 lb

## 2017-03-19 DIAGNOSIS — C9001 Multiple myeloma in remission: Secondary | ICD-10-CM | POA: Diagnosis not present

## 2017-03-19 LAB — COMPREHENSIVE METABOLIC PANEL
ALT: 17 U/L (ref 0–55)
AST: 16 U/L (ref 5–34)
Albumin: 4 g/dL (ref 3.5–5.0)
Alkaline Phosphatase: 79 U/L (ref 40–150)
Anion Gap: 10 mEq/L (ref 3–11)
BUN: 17.4 mg/dL (ref 7.0–26.0)
CHLORIDE: 106 meq/L (ref 98–109)
CO2: 26 meq/L (ref 22–29)
CREATININE: 0.8 mg/dL (ref 0.6–1.1)
Calcium: 9.8 mg/dL (ref 8.4–10.4)
EGFR: 76 mL/min/{1.73_m2} — ABNORMAL LOW (ref >90)
GLUCOSE: 82 mg/dL (ref 70–140)
Potassium: 4 mEq/L (ref 3.5–5.1)
Sodium: 142 mEq/L (ref 136–145)
Total Bilirubin: 0.81 mg/dL (ref 0.20–1.20)
Total Protein: 7.8 g/dL (ref 6.4–8.3)

## 2017-03-19 LAB — CBC & DIFF AND RETIC
BASO%: 0.9 % (ref 0.0–2.0)
BASOS ABS: 0 10*3/uL (ref 0.0–0.1)
EOS%: 3.2 % (ref 0.0–7.0)
Eosinophils Absolute: 0.1 10*3/uL (ref 0.0–0.5)
HEMATOCRIT: 40 % (ref 34.8–46.6)
HGB: 13.2 g/dL (ref 11.6–15.9)
Immature Retic Fract: 4.1 % (ref 1.60–10.00)
LYMPH#: 0.8 10*3/uL — AB (ref 0.9–3.3)
LYMPH%: 23.4 % (ref 14.0–49.7)
MCH: 31.9 pg (ref 25.1–34.0)
MCHC: 33 g/dL (ref 31.5–36.0)
MCV: 96.6 fL (ref 79.5–101.0)
MONO#: 0.3 10*3/uL (ref 0.1–0.9)
MONO%: 8.2 % (ref 0.0–14.0)
NEUT#: 2.2 10*3/uL (ref 1.5–6.5)
NEUT%: 64.3 % (ref 38.4–76.8)
Platelets: 229 10*3/uL (ref 145–400)
RBC: 4.14 10*6/uL (ref 3.70–5.45)
RDW: 15.4 % — AB (ref 11.2–14.5)
RETIC %: 1.47 % (ref 0.70–2.10)
RETIC CT ABS: 60.86 10*3/uL (ref 33.70–90.70)
WBC: 3.4 10*3/uL — ABNORMAL LOW (ref 3.9–10.3)

## 2017-03-19 MED ORDER — ACYCLOVIR 400 MG PO TABS: 400.0000 mg | ORAL_TABLET | Freq: Two times a day (BID) | ORAL | 2 refills | Status: DC

## 2017-03-19 NOTE — Patient Instructions (Signed)
Thank you for choosing [facility] to provide your oncology and hematology care.  To afford each patient quality time with our providers, please arrive 30 minutes before your scheduled appointment time.  If you arrive late for your appointment, you may be asked to reschedule.  We strive to give you quality time with our providers, and arriving late affects you and other patients whose appointments are after yours.  If you are a no show for multiple scheduled visits, you may be dismissed from the clinic at the providers discretion.   Again, thank you for choosing [facility], our hope is that these requests will decrease the amount of time that you wait before being seen by our physicians.  ______________________________________________________________________ Should you have questions after your visit to the [facility], please contact our office at [phone] between the hours of 8:30 and 4:30 p.m.    Voicemails left after 4:30p.m will not be returned until the following business day.   For prescription refill requests, please have your pharmacy contact us directly.  Please also try to allow 48 hours for prescription requests.   Please contact the scheduling department for questions regarding scheduling.  For scheduling of procedures such as PET scans, CT scans, MRI, Ultrasound, etc please contact central scheduling at [phone].   Resources For Cancer Patients and Caregivers:  American Cancer Society:  800-227-2345  Can help patients locate various types of support and financial assistance Cancer Care: 1-800-813-HOPE (4673) Provides financial assistance, online support groups, medication/co-pay assistance.   [place] DSS:  336-641-3447 Where to apply for food stamps, Medicaid, and utility assistance Medicare Rights Center: 800-333-4114 Helps people with Medicare understand their rights and benefits, navigate the Medicare system, and secure the  quality healthcare they deserve SCAT: 336-333-6589 [employer] [employer]'s shared-ride transportation service for eligible riders who have a disability that prevents them from riding the fixed route bus.   For additional information on assistance programs please contact our social worker:   Grier Hock/Abigail Elmore:  [phone] 

## 2017-03-20 ENCOUNTER — Telehealth: Payer: Self-pay

## 2017-03-20 NOTE — Telephone Encounter (Signed)
Relayed that [name] okay for pt to use vaginal estrogen cream. He has already sent message to Dr. Quincy Simmonds regarding this.

## 2017-03-20 NOTE — Progress Notes (Signed)
.    HEMATOLOGY/ONCOLOGY CLINIC NOTE  Date of Service: .03/19/2017  Patient Care Team: Paula Johnson, David, Paula Johnson as PCP - General (Family Medicine)  CHIEF COMPLAINTS/PURPOSE OF CONSULTATION:  Follow up for multiple myeloma  DIAGNOSIS  IgA lambda R-ISS stage II multiple myeloma with innumerable lytic lesions in the calvarium, lesion in T7 vertebra and multiple lesions in the pelvis.  No significant bone pain at this time. Diagnosed in January 2017.  Current treatment  -Revlimid maintenance 15mg po daily  Previous Treatment  Status post Vd x 1 cycle VRd x 6 cycles  HD Melphalan 200mg/m2 on 03/22/2016 Autologous HSCT on 03/23/2016 with Dr Gasperetto at [facility] (Dose of 6.4 x10^6/kg)    HISTORY OF PRESENTING ILLNESS: Please see my initial consultation for details of her initial presentation.  INTERVAL HISTORY  Paula Johnson is here for her scheduled followup for multiple myeloma. She continues to tolerate her Revlimid well with no acute concerns. She is in good spirits and having no other acute new symptoms. No fevers no chills no night sweats no unexpected weight loss. Has been staying physically active and eating well.  MEDICAL HISTORY:  Past Medical History:  Diagnosis Date  . Cancer (HCC) 1999   squamous cell carcinoma of thymus  . Fibroid    reason for Hysterectomy  . Multiple myeloma (HCC) 08/2015  . Multiple myeloma (HCC)   . Pain aggravated by activities of daily living    states she pulled something at right sternum in dec 2016  . PONV (postoperative nausea and vomiting)    thinks morphine caused nausea  . Prediabetes   . Urinary incontinence     SURGICAL HISTORY: Past Surgical History:  Procedure Laterality Date  . ABDOMINAL HYSTERECTOMY  1997   TAH--ovaries remain--Dr. Thomas Henley  . THYMECTOMY  1999    SOCIAL HISTORY: Social History   Social History  . Marital status: Married    Spouse name: N/A  . Number of children: N/A  . Years of education: N/A    Occupational History  . Not on file.   Social History Main Topics  . Smoking status: Never Smoker  . Smokeless tobacco: Never Used  . Alcohol use No  . Drug use: No  . Sexual activity: No     Comment: TAH--ovaries remain   Other Topics Concern  . Not on file   Social History Narrative  . No narrative on file    FAMILY HISTORY: Family History  Problem Relation Age of Onset  . COPD Father        dec age 85/s-smoked  . Diabetes Brother   . Other Brother        committed suicide age 51  . Hyperlipidemia Mother   . Diabetes Maternal Grandmother     ALLERGIES:  is allergic to penicillins and ampicillin.  MEDICATIONS:  Current Outpatient Prescriptions  Medication Sig Dispense Refill  . acyclovir (ZOVIRAX) 400 MG tablet Take 1 tablet (400 mg total) by mouth 2 (two) times daily. 60 tablet 2  . aspirin EC 81 MG tablet Take 81 mg by mouth daily.    . Bisacodyl (DULCOLAX PO) Take by mouth. 4 daily    . cholecalciferol (VITAMIN D) 1000 units tablet Take 2,000 Units by mouth daily.    . fluticasone (FLONASE) 50 MCG/ACT nasal spray Place into the nose.    . Magnesium 400 MG CAPS Take 1 tablet by mouth daily.    . Polyethylene Glycol 3350 (MIRALAX PO) Take by mouth.    .   REVLIMID 15 MG capsule TAKE 1 CAPSULE BY MOUTH ONCE DAILY 21 capsule 0   No current facility-administered medications for this visit.     REVIEW OF SYSTEMS:    10 Point review of Systems was done is negative except as noted above.  PHYSICAL EXAMINATION: ECOG PERFORMANCE STATUS: 1 - Symptomatic but completely ambulatory  .VSS as per EPIC GENERAL:alert, in no acute distress and comfortable SKIN: skin color, texture, turgor are normal, no rashes or significant lesions EYES: normal, conjunctiva are pink and non-injected, sclera clear OROPHARYNX:no exudate, no erythema and lips, buccal mucosa, and tongue normal  NECK: supple, no JVD, thyroid normal size, non-tender, without nodularity LYMPH:  no palpable  lymphadenopathy in the cervical, axillary or inguinal LUNGS: clear to auscultation with normal respiratory effort HEART: regular rate & rhythm,  no murmurs and no lower extremity edema ABDOMEN: abdomen soft, non-tender, normoactive bowel sounds  Musculoskeletal: no cyanosis of digits and no clubbing  PSYCH: alert & oriented x 3 with fluent speech NEURO: no focal motor/sensory deficits  LABORATORY DATA:  I have reviewed the data as listed . CBC Latest Ref Rng & Units 03/19/2017 01/09/2017 11/07/2016  WBC 3.9 - 10.3 10e3/uL 3.4(L) 3.5(L) 4.5  Hemoglobin 11.6 - 15.9 g/dL 13.2 13.3 12.2  Hematocrit 34.8 - 46.6 % 40.0 40.0 37.1  Platelets 145 - 400 10e3/uL 229 300 253   ANC 2.2k . CBC    Component Value Date/Time   WBC 3.4 (L) 03/19/2017 1430   WBC 2.3 (L) 01/28/2016 0720   RBC 4.14 03/19/2017 1430   RBC 3.79 (L) 01/28/2016 0720   HGB 13.2 03/19/2017 1430   HCT 40.0 03/19/2017 1430   PLT 229 03/19/2017 1430   MCV 96.6 03/19/2017 1430   MCH 31.9 03/19/2017 1430   MCH 30.6 01/28/2016 0720   MCHC 33.0 03/19/2017 1430   MCHC 32.2 01/28/2016 0720   RDW 15.4 (H) 03/19/2017 1430   LYMPHSABS 0.8 (L) 03/19/2017 1430   MONOABS 0.3 03/19/2017 1430   EOSABS 0.1 03/19/2017 1430   BASOSABS 0.0 03/19/2017 1430     . CMP Latest Ref Rng & Units 03/19/2017 01/09/2017 01/09/2017  Glucose 70 - 140 mg/dl 82 80 -  BUN 7.0 - 26.0 mg/dL 17.4 18.2 -  Creatinine 0.6 - 1.1 mg/dL 0.8 0.8 -  Sodium 136 - 145 mEq/L 142 140 -  Potassium 3.5 - 5.1 mEq/L 4.0 4.0 -  Chloride 101 - 111 mmol/L - - -  CO2 22 - 29 mEq/L 26 25 -  Calcium 8.4 - 10.4 mg/dL 9.8 9.9 -  Total Protein 6.4 - 8.3 g/dL 7.8 8.1 7.1  Total Bilirubin 0.20 - 1.20 mg/dL 0.81 0.62 -  Alkaline Phos 40 - 150 U/L 79 80 -  AST 5 - 34 U/L 16 14 -  ALT 0 - 55 U/L 17 21 -         RADIOGRAPHIC STUDIES: I have personally reviewed the radiological images as listed and agreed with the findings in the report. No results found.  ASSESSMENT &  PLAN:   Very pleasant 61 year old Caucasian female with  1) R-ISS Stage II IgA lambda multiple myeloma with standard risk cytogenetics t (11;14) currently in remission.  ONCOLOGIC HISTORY On diagnosis PET/CT showed evidence of lytic bone lesions in the calvarium, iliac bones  and T7. No hypercalcemia or renal failure at this time. Pretreatment M spike of 5 in 2 monoclonal bands. BM bx with 69% plasma cells with Lambda light chain restriction Patient is status post  cycle 1 of Vd and 5 cycles of VRd and has she tolerated well other than some grade 1 rash. Also noted to have fevers from Velcade which are controlled with naproxen.  S/p 6 cycles of VRd- VGPR pre-transplant S[facility]  Myeloma panel 10/09/2016 -- no M protein and IFE neg and nl K/L SFC ratio. #2 anemia due to multiple myeloma and treatment medications- resolved #3 fatigue due to multiple myeloma and Anemia - resolved #4 h/o multiple bone metastases from multiple myeloma #5 Intermittent Muscle cramps - could be from Revlimid/dehydration. Plan -Patient is doing well with stable labs. No M spike and normal serum free light chain ratio. - patient is tolerating maintenance Revlimid to 15 mg po daily and we shall continue this. --continue ASA daily for VTE prophylaxis while on Revlimid -Continue acyclovir VZV prophylaxis for 1 year post-transplant for an additional couple of months. -[place] [facility]-year post[place]plant follow-up. She will be calling [facility] to set up an appointment with Dr. GSamule Ohm -counseled on infection prevention strategies. -we'll follow-up on the myeloma panel and serum free Light chain results from today.  RTC with Dr KIrene Limboin 2 months with labs   I spent 20 minutes counseling the patient face to face. The total time spent in the appointment was 25 minutes and more than 50% was on counseling and direct  patient cares.    GSullivan LoneMD MTiroAAHIVMS SLucile [facility] Hosp. At StanfordCRehabilitation Hospital Of Rhode IslandHematology/Oncology Physician CMemorial [facility] (Office):       [phone](Work cell):  [phone](Fax):           [phone]

## 2017-03-21 ENCOUNTER — Telehealth: Payer: Self-pay | Admitting: Obstetrics and Gynecology

## 2017-03-21 LAB — MULTIPLE MYELOMA PANEL, SERUM
ALBUMIN SERPL ELPH-MCNC: 3.9 g/dL (ref 2.9–4.4)
Albumin/Glob SerPl: 1.2 (ref 0.7–1.7)
Alpha 1: 0.3 g/dL (ref 0.0–0.4)
Alpha2 Glob SerPl Elph-Mcnc: 0.8 g/dL (ref 0.4–1.0)
B-Globulin SerPl Elph-Mcnc: 1.2 g/dL (ref 0.7–1.3)
Gamma Glob SerPl Elph-Mcnc: 1.2 g/dL (ref 0.4–1.8)
Globulin, Total: 3.4 g/dL (ref 2.2–3.9)
IGA/IMMUNOGLOBULIN A, SERUM: 412 mg/dL — AB (ref 87–352)
IGM (IMMUNOGLOBIN M), SRM: 37 mg/dL (ref 26–217)
IgG, Qn, Serum: 1022 mg/dL (ref 700–1600)
TOTAL PROTEIN: 7.3 g/dL (ref 6.0–8.5)

## 2017-03-21 LAB — KAPPA/LAMBDA LIGHT CHAINS
Ig Kappa Free Light Chain: 30.4 mg/L — ABNORMAL HIGH (ref 3.3–19.4)
Ig Lambda Free Light Chain: 31.1 mg/L — ABNORMAL HIGH (ref 5.7–26.3)
KAPPA/LAMBDA FLC RATIO: 0.98 (ref 0.26–1.65)

## 2017-03-21 NOTE — Telephone Encounter (Signed)
Patient was seen on 03/15/2017 with Dr.Silva. Patient's pessary was removed and she was instructed to leave pessary out for 1 week. Patient reports she began having light spotting 2 days ago. "I am not sure if it is because I do not have the pessary in and everything is rubbing on the incontinence pad or not." Patient was going to start using vaginal estrogen and replace her pessary tomorrow. Asking if this is okay or if Dr.Silva would like her to do something different.

## 2017-03-21 NOTE — Telephone Encounter (Signed)
Patient is still having bleeding with the pessary out. She wants to know what she needs to do next.

## 2017-03-21 NOTE — Telephone Encounter (Signed)
Please schedule a brief visit with me.  I want to examine her before she starts using the pessary again.

## 2017-03-21 NOTE — Telephone Encounter (Signed)
Left message to call Kaitlyn at [phone]. 

## 2017-03-21 NOTE — Telephone Encounter (Signed)
Left detailed message, per patients request. Advised as seen below per Dr. Quincy Simmonds. Advised patient office is closed, phones will return on at 8am, return call to office for assistance with scheduling OV.

## 2017-03-21 NOTE — Telephone Encounter (Signed)
Patient returning call. Ok to leave a detailed message since she's going into an appointment.

## 2017-03-22 ENCOUNTER — Other Ambulatory Visit (HOSPITAL_COMMUNITY)
Admission: RE | Admit: 2017-03-22 | Discharge: 2017-03-22 | Disposition: A | Discharge status: Home / Self Care | Payer: 59 | Source: Ambulatory Visit | Attending: Obstetrics and Gynecology | Admitting: Obstetrics and Gynecology

## 2017-03-22 ENCOUNTER — Ambulatory Visit (INDEPENDENT_AMBULATORY_CARE_PROVIDER_SITE_OTHER): Payer: 59 | Admitting: Obstetrics and Gynecology

## 2017-03-22 ENCOUNTER — Encounter: Payer: Self-pay | Admitting: Obstetrics and Gynecology

## 2017-03-22 VITALS — BP 110/66 | HR 72 | Resp 16 | Wt 135.0 lb

## 2017-03-22 DIAGNOSIS — N898 Other specified noninflammatory disorders of vagina: Secondary | ICD-10-CM

## 2017-03-22 DIAGNOSIS — R87610 Atypical squamous cells of undetermined significance on cytologic smear of cervix (ASC-US): Secondary | ICD-10-CM | POA: Diagnosis not present

## 2017-03-22 DIAGNOSIS — N993 Prolapse of vaginal vault after hysterectomy: Secondary | ICD-10-CM | POA: Diagnosis not present

## 2017-03-22 DIAGNOSIS — R87629 Unspecified abnormal cytological findings in specimens from vagina: Secondary | ICD-10-CM

## 2017-03-22 HISTORY — DX: Unspecified abnormal cytological findings in specimens from vagina: R87.629

## 2017-03-22 NOTE — Progress Notes (Signed)
GYNECOLOGY  VISIT   HPI: 61 y.o.   Married  Caucasian  female   G2P2002 with No LMP recorded. Patient has had a hysterectomy.   here for  Recheck.  Pessary has been out due to granulation tissue treated at last visit on 03/15/17.   Bleeding for 2 days this week and was supposed to start using her pessary again.   Prolapse is bothersome and [place].   Contact from her oncologist through a staff message as follows: Brunetta Genera, MD  [name], MD  Cc: Arty Baumgartner, RN; Johann Capers, RN        Hello Dr Dietrich Pates,   Ms Mowbray is on maintenance treatment with Revlimid for her Myeloma. This increases the risk of VTE and she is on Aspirin prophylaxis for this.  Vaginal use of estrogen might lead to some element of systemic absorption and might increase the increase of VTE to some degree which would be difficult to quantify.  It would be a risk vs benefit discussion. Would not be absolutely contraindicated. If no other options are available to address her concerns from your standpoint - would recommend using the least effective amount of estradiol.   Thanks  Sullivan Lone MD MS   Previous Messages    ----- Message -----  From: [name], MD  Sent: 03/15/2017  4:13 PM  To: Brunetta Genera, MD  Subject: use of local vaginal estrogen ok?         Hello [name],   We both care for Ms. Koerber and I would like to recommend the use of local vaginal estrogen to treat and to prevent inflammation from use of a vaginal pessary for prolapse.   I would like to use a very small amount such as 1/2 gram per vagina twice a week of Premarin or Estrace cream. In some circumstances, I am using compounded vaginal estradiol cream for local therapy if the cost of the name brands are prohibitive.   Ms. Darko mentioned a recommendation to avoid this around the time of her stem cell transplant.   Would you  approve the use of the estrogen cream?   Thank you,   Josefa Half, MD  [place]: No LMP recorded. Patient has had a hysterectomy. Contraception:  Hysterectomy Menopausal hormone therapy:  none Last mammogram:  06/23/16 BIRADS 1 negative/density b Last pap smear:   2015 Negative        OB History    Gravida Para Term Preterm AB Living   2 2 2     2    SAB TAB Ectopic Multiple Live Births                     Patient Active Problem List   Diagnosis Date Noted  . Near syncope 01/04/2016  . Peripheral edema 12/19/2015  . Cough 11/30/2015  . Fever 11/08/2015  . Hematuria 11/08/2015  . Rash 11/01/2015  . Multiple myeloma ([place]) 10/19/2015  . Metastatic multiple myeloma to bone ([place]) 10/19/2015  . Absolute anemia   . Plasma cell dyscrasia 10/05/2015  . Anemia 09/29/2015  . Hypergammaglobulinemia 09/29/2015  . HYPOXEMIA 12/16/2007  . HYPERLIPIDEMIA 12/04/2007  . DYSPNEA 12/04/2007  . MALIGNANT NEOPLASM OF THYMUS 12/03/2007  . OBSTRUCTIVE SLEEP APNEA 12/03/2007  . RESTLESS LEGS SYNDROME 12/03/2007    Past Medical History:  Diagnosis Date  . Cancer ([place]) 1999   squamous cell carcinoma of thymus  . Fibroid    reason for Hysterectomy  . Multiple myeloma ([place]) 08/2015  . Multiple myeloma ([place])   . Pain aggravated by activities of daily living    states she pulled something at right sternum in dec 2016  . PONV (postoperative nausea and vomiting)    thinks morphine caused nausea  . Prediabetes   . Urinary incontinence     Past Surgical History:  Procedure Laterality Date  . ABDOMINAL HYSTERECTOMY  1997   TAH--ovaries remain--Dr. Newton Pigg  . THYMECTOMY  1999    Current Outpatient Prescriptions  Medication Sig Dispense Refill  . acyclovir (ZOVIRAX) 400 MG tablet Take 1 tablet (400 mg total) by mouth 2 (two) times daily. 60 tablet 2  . aspirin EC 81 MG tablet Take 81 mg by mouth daily.    . Bisacodyl (DULCOLAX  PO) Take by mouth. 4 daily    . cholecalciferol (VITAMIN D) 1000 units tablet Take 2,000 Units by mouth daily.    . fluticasone (FLONASE) 50 MCG/ACT nasal spray Place into the nose.    . Magnesium 400 MG CAPS Take 1 tablet by mouth daily.    . Polyethylene Glycol 3350 (MIRALAX PO) Take by mouth.    . REVLIMID 15 MG capsule TAKE 1 CAPSULE BY MOUTH ONCE DAILY 21 capsule 0   No current facility-administered medications for this visit.      ALLERGIES: Penicillins and Ampicillin  Family History  Problem Relation Age of Onset  . COPD Father        dec age 67/s-smoked  . Diabetes Brother   . Other Brother        committed suicide age 7  . Hyperlipidemia Mother   . Diabetes Maternal Grandmother     Social History   Social History  . Marital status: Married    Spouse name: N/A  . Number of children: N/A  . Years of education: N/A   Occupational History  . Not on file.   Social History Main Topics  . Smoking status: Never Smoker  . Smokeless tobacco: Never Used  . Alcohol use No  . Drug use: No  . Sexual activity: No     Comment: TAH--ovaries remain   Other Topics Concern  . Not on file   Social History Narrative  . No narrative on file    ROS:  Pertinent items are noted in HPI.  PHYSICAL EXAMINATION:    BP 110/66 (BP Location: Right Arm, Patient Position: Sitting, Cuff Size: Normal)   Pulse 72   Resp 16   Wt 135 lb (61.2 kg)   BMI 21.14 kg/m     General appearance: alert, cooperative and appears stated age    Pelvic: External genitalia:  no lesions              Urethra:  normal appearing urethra with no masses, tenderness or lesions              Bartholins and Skenes: normal                 Vagina: normal appearing vagina with normal color and discharge, no lesions.  Bilateral vaginal walls with erythema.  Vaginal cuff with raised small amount of granulation tissue.  Pap was taken of this area.              Cervix:  Absent.  Bimanual Exam:   Uterus:   Absent.               Adnexa: no mass, fullness, tenderness                Chaperone was present for exam.  ASSESSMENT  Vaginal vault prolapse following hysterectomy.  Vaginal granulation tissue and inflammation.  Multiple myeloma under treatment.   PLAN  Pap and HR HPV performed.  Ok to restart use of pessary.  Will have patient start using vaginal estrogen cream 1/2 gram pv at hs twice weekly for 2 months.  May be then able to reduce to once weekly.  Discussed risk of stroke, DVT, PE, MI, and breast cancer.  Follow up in 2 months.    An After Visit Summary was printed and given to the patient.  ___15___ minutes face to face time of which over 50% was spent in counseling.

## 2017-03-22 NOTE — Telephone Encounter (Signed)
Spoke with patient. Appointment scheduled for 03/22/2017 at 11 am with Dr.Silva. Patient is agreeable to date and time.  Routing to provider for final review. Patient agreeable to disposition. Will close encounter.

## 2017-03-23 ENCOUNTER — Telehealth: Payer: Self-pay | Admitting: Hematology

## 2017-03-23 NOTE — Telephone Encounter (Signed)
Scheduled appt per 6/25 los - sent reminder letter in the mail.

## 2017-03-28 ENCOUNTER — Encounter: Payer: Self-pay | Admitting: Obstetrics and Gynecology

## 2017-03-28 LAB — CYTOLOGY - PAP
DIAGNOSIS: UNDETERMINED — AB
HPV: NOT DETECTED

## 2017-03-29 ENCOUNTER — Other Ambulatory Visit: Payer: Self-pay | Admitting: Hematology

## 2017-03-29 DIAGNOSIS — C9000 Multiple myeloma not having achieved remission: Secondary | ICD-10-CM

## 2017-03-30 ENCOUNTER — Other Ambulatory Visit: Payer: Self-pay | Admitting: *Deleted

## 2017-03-30 ENCOUNTER — Telehealth: Payer: Self-pay | Admitting: *Deleted

## 2017-03-30 DIAGNOSIS — C9000 Multiple myeloma not having achieved remission: Secondary | ICD-10-CM

## 2017-03-30 MED ORDER — LENALIDOMIDE 15 MG PO CAPS
15.0000 mg | ORAL_CAPSULE | Freq: Every day | ORAL | 0 refills | Status: DC
Start: 2017-03-30 — End: 2017-03-30

## 2017-03-30 MED ORDER — LENALIDOMIDE 15 MG PO CAPS: 15.0000 mg | ORAL_CAPSULE | Freq: Every day | ORAL | 0 refills | Status: DC

## 2017-03-30 NOTE — Telephone Encounter (Signed)
Called [employer] customer service due to inability to generate new authorization number.  New number obtained by customer representative # [phone] effective 03/30/17.  Fax sent to [place] notifying them of authorization number.

## 2017-04-10 IMAGING — CT CT BIOPSY
2 of 3 series · 11 of 21 positions shown, 15 images · non-contrast
Comparison: none

INDICATION: MULTIPLE MYELOMA, RECEIVING CHEMOTHERAPY, PLANNING FOR STEM-CELL
TRANSPLANT

[Series 2: pelvis st · axial · 0.39mm/px · z∈[-386,-316]mm · 8 of 17 slices shown]
[im 2/17  soft-tissue]
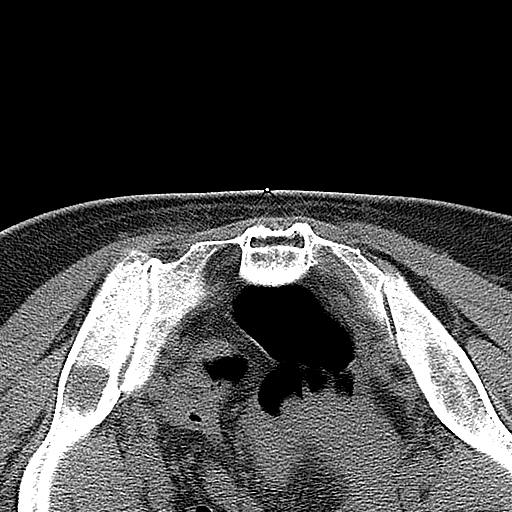
[im 4/17  soft-tissue]
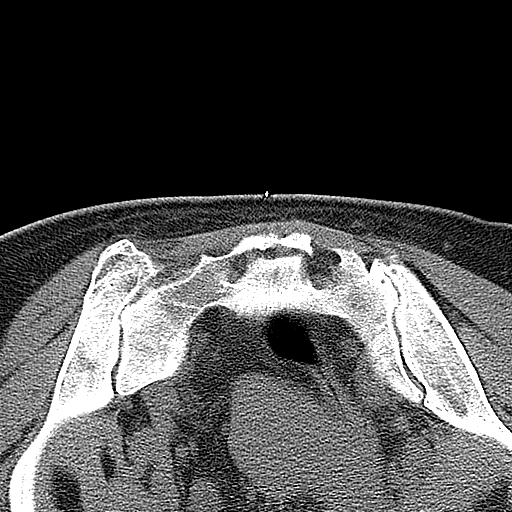
[im 6/17  soft-tissue]
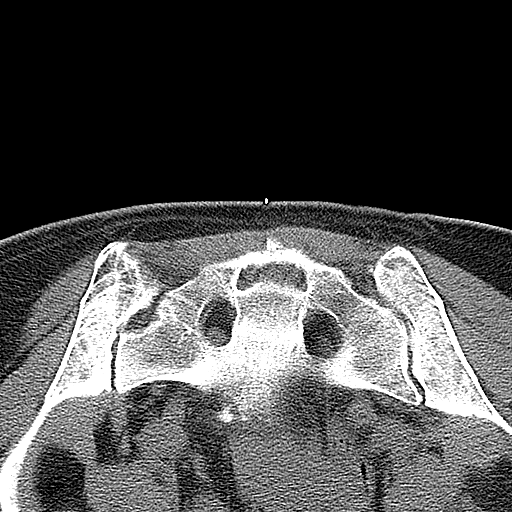
[im 8/17  soft-tissue]
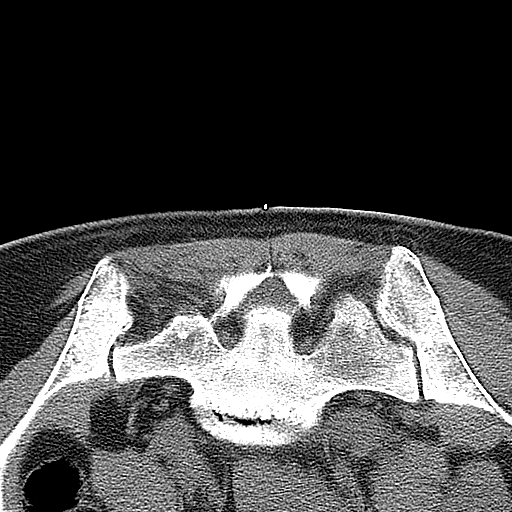
[im 10/17  soft-tissue]
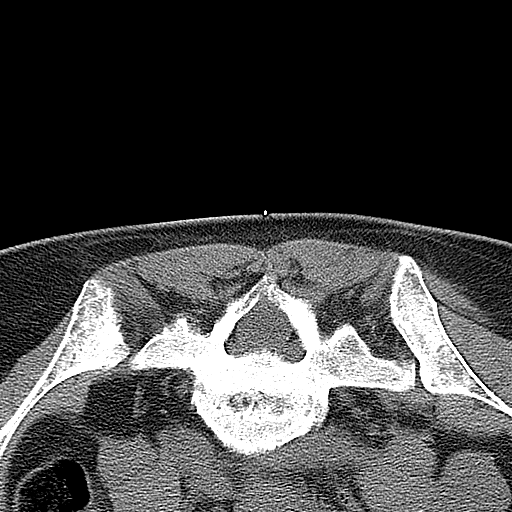
[im 12/17  soft-tissue]
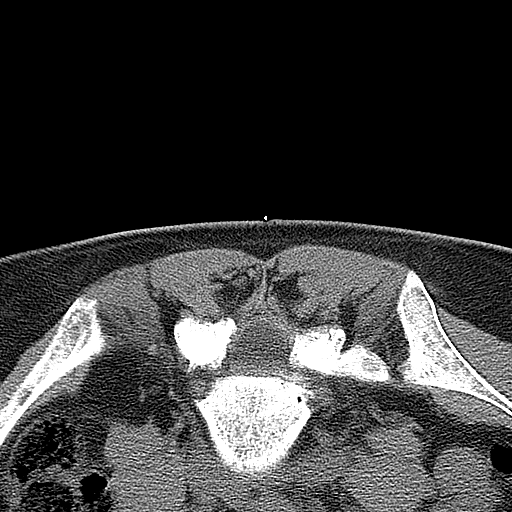
[im 14/17  soft-tissue]
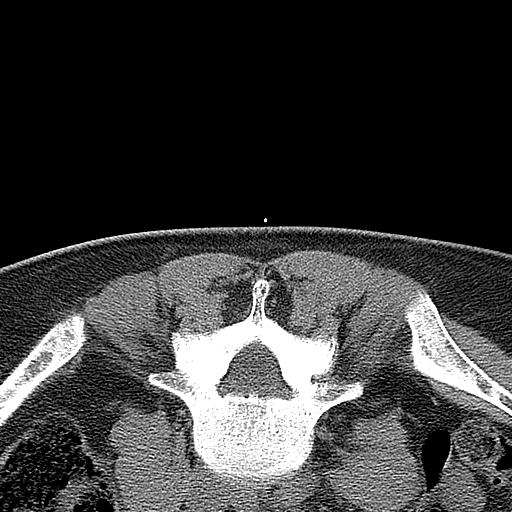
[im 16/17  soft-tissue]
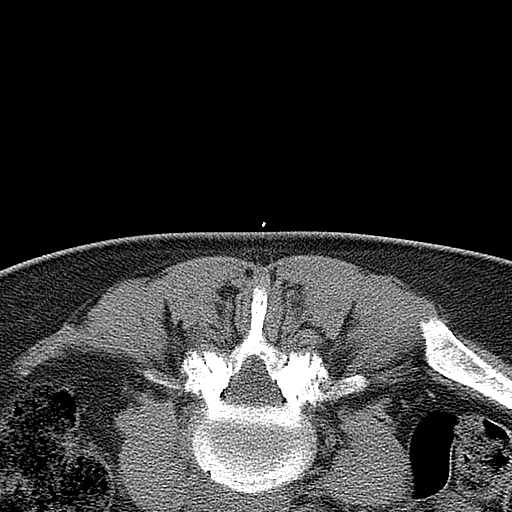

[Series 4: add scan 5.0 b70s · axial · 0.39mm/px · z∈[-380,-370]mm · 3 of 3 slices shown, 7 images]
[im 1/3  soft-tissue]
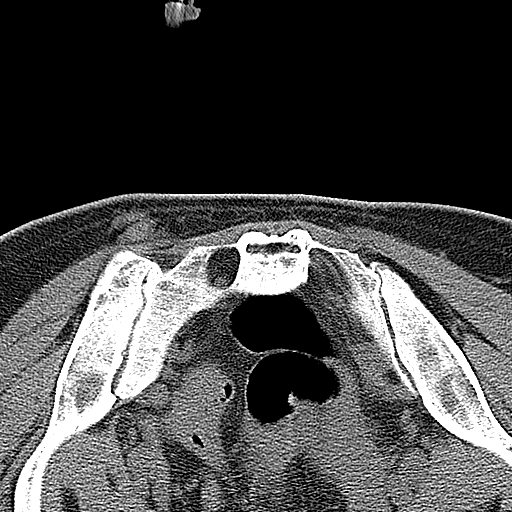
[im 1/3  lung]
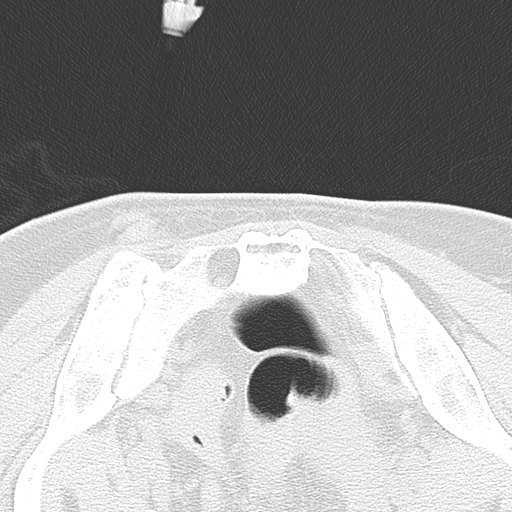
[im 1/3  bone]
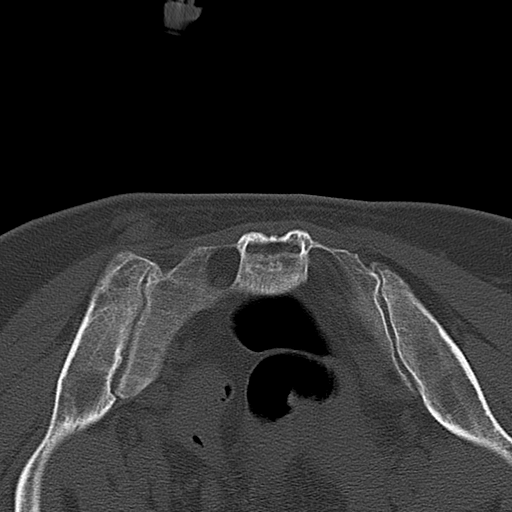
[im 2/3  soft-tissue]
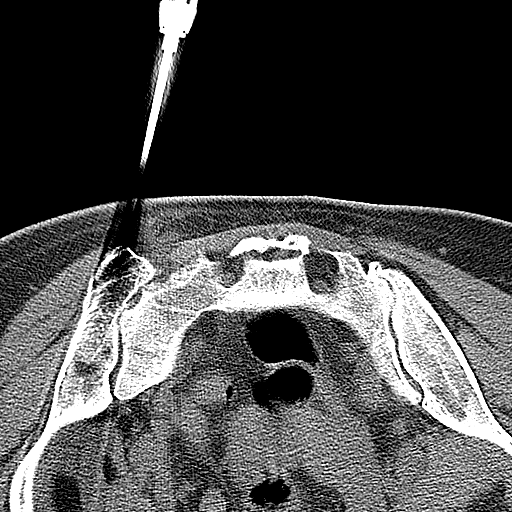
[im 2/3  lung]
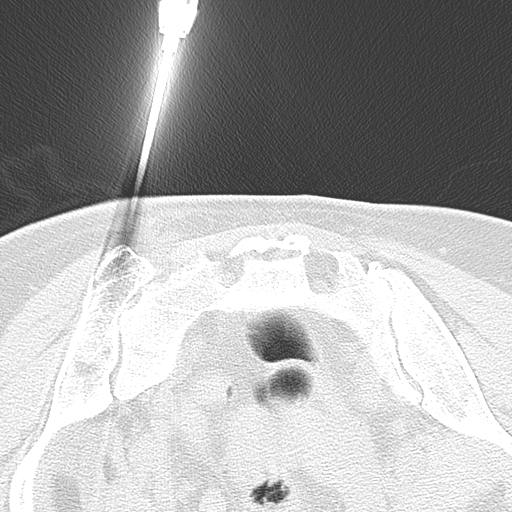
[im 3/3  soft-tissue]
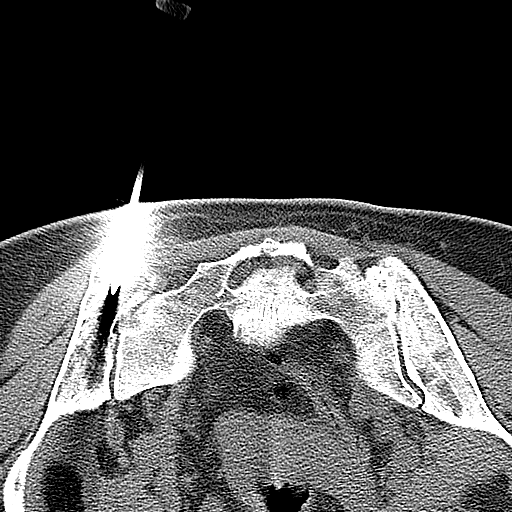
[im 3/3  lung]
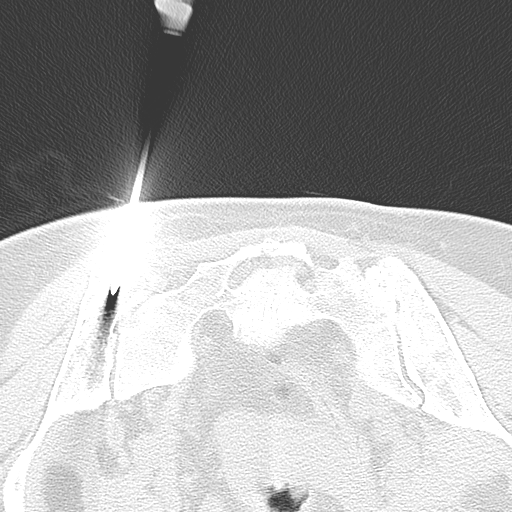

[11 of 21 positions shown; findings below may reference images not displayed]

EXAM:
CT GUIDED RIGHT ILIAC BONE MARROW ASPIRATION AND CORE BIOPSY

Radiologist:  Zavalza, Saud

Guidance:  CT

FLUOROSCOPY TIME:  Fluoroscopy Time: None.

MEDICATIONS:
1% lidocaine locally

ANESTHESIA/SEDATION:
3.0 mg IV Versed; 50 mcg IV Fentanyl

Moderate Sedation Time:  10 minutes

The patient was continuously monitored during the procedure by the
interventional radiology nurse under my direct supervision.

CONTRAST:  None.

COMPLICATIONS:
None

PROCEDURE:
Informed consent was obtained from the patient following explanation
of the procedure, risks, benefits and alternatives. The patient
understands, agrees and consents for the procedure. All questions
were addressed. A time out was performed.

The patient was positioned prone and non-contrast localization CT
was performed of the pelvis to demonstrate the iliac marrow spaces.

Maximal barrier sterile technique utilized including caps, mask,
sterile gowns, sterile gloves, large sterile drape, hand hygiene,
and Betadine prep.

Under sterile conditions and local anesthesia, an 11 gauge coaxial
bone biopsy needle was advanced into the right iliac marrow space.
Needle position was confirmed with CT imaging. Initially, bone
marrow aspiration was performed. Next, the 11 gauge outer cannula
was utilized to obtain a right iliac bone marrow core biopsy. Needle
was removed. Hemostasis was obtained with compression. The patient
tolerated the procedure well. Samples were prepared with the
cytotechnologist. No immediate complications.
IMPRESSION: CT guided right iliac bone marrow aspiration and core biopsy.

## 2017-04-16 ENCOUNTER — Other Ambulatory Visit: Payer: Self-pay | Admitting: Hematology

## 2017-04-16 DIAGNOSIS — C9000 Multiple myeloma not having achieved remission: Secondary | ICD-10-CM

## 2017-04-17 ENCOUNTER — Other Ambulatory Visit: Payer: Self-pay

## 2017-04-17 DIAGNOSIS — C9000 Multiple myeloma not having achieved remission: Secondary | ICD-10-CM

## 2017-04-17 MED ORDER — LENALIDOMIDE 15 MG PO CAPS: 15.0000 mg | ORAL_CAPSULE | Freq: Every day | ORAL | 0 refills | Status: DC

## 2017-05-14 ENCOUNTER — Telehealth: Payer: Self-pay

## 2017-05-14 ENCOUNTER — Other Ambulatory Visit: Payer: Self-pay | Admitting: Hematology

## 2017-05-14 DIAGNOSIS — C9000 Multiple myeloma not having achieved remission: Secondary | ICD-10-CM

## 2017-05-14 NOTE — Telephone Encounter (Signed)
Pt called to confirm it was ok to take bactrim with revlimid. Told her it was OK

## 2017-05-23 ENCOUNTER — Ambulatory Visit: Payer: 59 | Admitting: Hematology

## 2017-05-23 ENCOUNTER — Other Ambulatory Visit: Payer: 59

## 2017-05-23 ENCOUNTER — Ambulatory Visit: Payer: Self-pay | Admitting: Obstetrics and Gynecology

## 2017-05-25 ENCOUNTER — Telehealth: Payer: Self-pay | Admitting: Hematology

## 2017-05-25 ENCOUNTER — Other Ambulatory Visit (HOSPITAL_BASED_OUTPATIENT_CLINIC_OR_DEPARTMENT_OTHER): Payer: 59

## 2017-05-25 ENCOUNTER — Ambulatory Visit (HOSPITAL_BASED_OUTPATIENT_CLINIC_OR_DEPARTMENT_OTHER): Payer: 59 | Admitting: Hematology

## 2017-05-25 ENCOUNTER — Encounter: Payer: Self-pay | Admitting: Hematology

## 2017-05-25 VITALS — BP 119/61 | HR 62 | Temp 98.1°F | Resp 18 | Ht 67.0 in | Wt 134.2 lb

## 2017-05-25 DIAGNOSIS — C9001 Multiple myeloma in remission: Secondary | ICD-10-CM

## 2017-05-25 DIAGNOSIS — R5383 Other fatigue: Secondary | ICD-10-CM

## 2017-05-25 LAB — COMPREHENSIVE METABOLIC PANEL
ALT: 19 U/L (ref 0–55)
ANION GAP: 6 meq/L (ref 3–11)
AST: 16 U/L (ref 5–34)
Albumin: 3.6 g/dL (ref 3.5–5.0)
Alkaline Phosphatase: 76 U/L (ref 40–150)
BUN: 13.1 mg/dL (ref 7.0–26.0)
CHLORIDE: 106 meq/L (ref 98–109)
CO2: 26 meq/L (ref 22–29)
CREATININE: 0.8 mg/dL (ref 0.6–1.1)
Calcium: 9.4 mg/dL (ref 8.4–10.4)
EGFR: 79 mL/min/{1.73_m2} — ABNORMAL LOW (ref >90)
GLUCOSE: 73 mg/dL (ref 70–140)
Potassium: 4.1 mEq/L (ref 3.5–5.1)
Sodium: 139 mEq/L (ref 136–145)
Total Bilirubin: 1.04 mg/dL (ref 0.20–1.20)
Total Protein: 7.2 g/dL (ref 6.4–8.3)

## 2017-05-25 LAB — CBC & DIFF AND RETIC
BASO%: 1.6 % (ref 0.0–2.0)
Basophils Absolute: 0 10*3/uL (ref 0.0–0.1)
EOS%: 5.9 % (ref 0.0–7.0)
Eosinophils Absolute: 0.2 10*3/uL (ref 0.0–0.5)
HCT: 38.4 % (ref 34.8–46.6)
HGB: 12.7 g/dL (ref 11.6–15.9)
Immature Retic Fract: 2 % (ref 1.60–10.00)
LYMPH#: 0.7 10*3/uL — AB (ref 0.9–3.3)
LYMPH%: 27 % (ref 14.0–49.7)
MCH: 32.2 pg (ref 25.1–34.0)
MCHC: 33.1 g/dL (ref 31.5–36.0)
MCV: 97.5 fL (ref 79.5–101.0)
MONO#: 0.4 10*3/uL (ref 0.1–0.9)
MONO%: 13.7 % (ref 0.0–14.0)
NEUT#: 1.3 10*3/uL — ABNORMAL LOW (ref 1.5–6.5)
NEUT%: 51.8 % (ref 38.4–76.8)
PLATELETS: 214 10*3/uL (ref 145–400)
RBC: 3.94 10*6/uL (ref 3.70–5.45)
RDW: 14.3 % (ref 11.2–14.5)
RETIC %: 1.13 % (ref 0.70–2.10)
RETIC CT ABS: 44.52 10*3/uL (ref 33.70–90.70)
WBC: 2.6 10*3/uL — AB (ref 3.9–10.3)

## 2017-05-25 NOTE — Patient Instructions (Signed)
Thank you for choosing [facility] to provide your oncology and hematology care.  To afford each patient quality time with our providers, please arrive 30 minutes before your scheduled appointment time.  If you arrive late for your appointment, you may be asked to reschedule.  We strive to give you quality time with our providers, and arriving late affects you and other patients whose appointments are after yours.  If you are a no show for multiple scheduled visits, you may be dismissed from the clinic at the providers discretion.   Again, thank you for choosing [facility], our hope is that these requests will decrease the amount of time that you wait before being seen by our physicians.  ______________________________________________________________________ Should you have questions after your visit to the [facility], please contact our office at [phone] between the hours of 8:30 and 4:30 p.m.    Voicemails left after 4:30p.m will not be returned until the following business day.   For prescription refill requests, please have your pharmacy contact us directly.  Please also try to allow 48 hours for prescription requests.   Please contact the scheduling department for questions regarding scheduling.  For scheduling of procedures such as PET scans, CT scans, MRI, Ultrasound, etc please contact central scheduling at [phone].   Resources For Cancer Patients and Caregivers:  American Cancer Society:  800-227-2345  Can help patients locate various types of support and financial assistance Cancer Care: 1-800-813-HOPE (4673) Provides financial assistance, online support groups, medication/co-pay assistance.   [place] DSS:  336-641-3447 Where to apply for food stamps, Medicaid, and utility assistance Medicare Rights Center: 800-333-4114 Helps people with Medicare understand their rights and benefits, navigate the Medicare system, and secure the  quality healthcare they deserve SCAT: 336-333-6589  [employer]'s shared-ride transportation service for eligible riders who have a disability that prevents them from riding the fixed route bus.   For additional information on assistance programs please contact our social worker:   Grier Hock/Abigail Elmore:  [phone] 

## 2017-05-25 NOTE — Telephone Encounter (Signed)
Scheduled appt per 8/31 los - patient is aware of appt date and time. Will pick up new schedule 9/14 next visit.

## 2017-05-26 LAB — KAPPA/LAMBDA LIGHT CHAINS
Ig Kappa Free Light Chain: 34.1 mg/L — ABNORMAL HIGH (ref 3.3–19.4)
Ig Lambda Free Light Chain: 31.5 mg/L — ABNORMAL HIGH (ref 5.7–26.3)
Kappa/Lambda FluidC Ratio: 1.08 (ref 0.26–1.65)

## 2017-05-28 LAB — MULTIPLE MYELOMA PANEL, SERUM
ALBUMIN SERPL ELPH-MCNC: 3.7 g/dL (ref 2.9–4.4)
ALPHA2 GLOB SERPL ELPH-MCNC: 0.7 g/dL (ref 0.4–1.0)
Albumin/Glob SerPl: 1.2 (ref 0.7–1.7)
Alpha 1: 0.2 g/dL (ref 0.0–0.4)
B-GLOBULIN SERPL ELPH-MCNC: 1.1 g/dL (ref 0.7–1.3)
Gamma Glob SerPl Elph-Mcnc: 1.2 g/dL (ref 0.4–1.8)
Globulin, Total: 3.1 g/dL (ref 2.2–3.9)
IGM (IMMUNOGLOBIN M), SRM: 39 mg/dL (ref 26–217)
IgA, Qn, Serum: 428 mg/dL — ABNORMAL HIGH (ref 87–352)
TOTAL PROTEIN: 6.8 g/dL (ref 6.0–8.5)

## 2017-05-30 ENCOUNTER — Telehealth: Payer: Self-pay

## 2017-05-30 ENCOUNTER — Telehealth: Payer: Self-pay | Admitting: Obstetrics and Gynecology

## 2017-05-30 ENCOUNTER — Other Ambulatory Visit: Payer: Self-pay | Admitting: Hematology

## 2017-05-30 DIAGNOSIS — C9000 Multiple myeloma not having achieved remission: Secondary | ICD-10-CM

## 2017-05-30 NOTE — Telephone Encounter (Signed)
Talked to pt support with [employer] as to why pt name was not visible on the prescriber dashboard. Pt is not yet due for revlimid refill. Will be able to refill on 9/7.

## 2017-05-30 NOTE — Telephone Encounter (Signed)
Spoke with patient. Patient states that she is having ongoing discomfort and spotting with her pessary. Reports this has never really resolved since OV on 6/28 with Dr.Silva. Is using Estrace cream 1/2 gram twice weekly. Reports she is noticing more spotting vaginally when she has a bowel movement. Advised this could be due to pressure with having a bowel movement. Denies rectal bleeding. Advised will need to be seen for further evaluation. Declines appointment until 06/01/2017. Appointment scheduled for 06/01/2017 at 2:30 pm with Dr.Miller. Patient is agreeable to date and time. Aware if symptoms worsen or develops new symptoms will need to be seen for further evaluation earlier.  Routing to provider for final review. Patient agreeable to disposition. Will close encounter.

## 2017-05-30 NOTE — Telephone Encounter (Signed)
Patient called with concerns she is "bleeding again" with her pessary.  Last seen: 03/22/17

## 2017-06-01 ENCOUNTER — Other Ambulatory Visit: Payer: Self-pay | Admitting: *Deleted

## 2017-06-01 ENCOUNTER — Encounter: Payer: Self-pay | Admitting: Obstetrics & Gynecology

## 2017-06-01 ENCOUNTER — Ambulatory Visit (INDEPENDENT_AMBULATORY_CARE_PROVIDER_SITE_OTHER): Payer: 59 | Admitting: Obstetrics & Gynecology

## 2017-06-01 VITALS — BP 110/60 | HR 76 | Resp 16 | Wt 135.0 lb

## 2017-06-01 DIAGNOSIS — N939 Abnormal uterine and vaginal bleeding, unspecified: Secondary | ICD-10-CM | POA: Diagnosis not present

## 2017-06-01 DIAGNOSIS — N898 Other specified noninflammatory disorders of vagina: Secondary | ICD-10-CM

## 2017-06-01 DIAGNOSIS — N993 Prolapse of vaginal vault after hysterectomy: Secondary | ICD-10-CM | POA: Diagnosis not present

## 2017-06-01 DIAGNOSIS — C9000 Multiple myeloma not having achieved remission: Secondary | ICD-10-CM

## 2017-06-01 MED ORDER — LENALIDOMIDE 15 MG PO CAPS: 15.0000 mg | ORAL_CAPSULE | Freq: Every day | ORAL | 0 refills | Status: DC

## 2017-06-01 MED ORDER — METRONIDAZOLE 0.75 % VA GEL: 1.0000 | Freq: Every day | VAGINAL | 0 refills | Status: DC

## 2017-06-01 NOTE — Progress Notes (Signed)
GYNECOLOGY  VISIT   HPI: 61 y.o. G34P2002 Married Caucasian female here for complaint of vaginal spotting with pessary use.  H/O vaginal vault prolapse after hysterectomy.  Using incontinence ring with support pessary.  Removes the pessary twice weekly, applies small amount of estrogen cream, and replaces the pessary.  Does not leave out overnight.  Does have urinary incontinence.   H/O multiple myeloma, in remission.    Pt has undergone silver nitrate treatment of granulation tissue as well as leaving pessary out.  Reports she felt so terrible when pessary was out due to worsening prolapse.  Feels she is probably having same issue as earlier this year and really would like to try "anything else" before having pessary removed.  H/o mild incontinence that is stable  GYNECOLOGIC HISTORY: No LMP recorded. Patient has had a hysterectomy. Contraception: hysterectomy Menopausal hormone therapy: vaginal estrogen, small amount twice weekly  Patient Active Problem List   Diagnosis Date Noted  . Near syncope 01/04/2016  . Peripheral edema 12/19/2015  . Cough 11/30/2015  . Fever 11/08/2015  . Hematuria 11/08/2015  . Rash 11/01/2015  . Multiple myeloma ([place]) 10/19/2015  . Metastatic multiple myeloma to bone ([place]) 10/19/2015  . Absolute anemia   . Plasma cell dyscrasia 10/05/2015  . Anemia 09/29/2015  . Hypergammaglobulinemia 09/29/2015  . HYPOXEMIA 12/16/2007  . HYPERLIPIDEMIA 12/04/2007  . DYSPNEA 12/04/2007  . MALIGNANT NEOPLASM OF THYMUS 12/03/2007  . OBSTRUCTIVE SLEEP APNEA 12/03/2007  . RESTLESS LEGS SYNDROME 12/03/2007    Past Medical History:  Diagnosis Date  . Abnormal Pap smear of vagina 03/22/2017   ASCUS pap and negative HR HPV.  Patient is on immunosuppressive medication.   . Cancer ([place]) 1999   squamous cell carcinoma of thymus  . Fibroid    reason for Hysterectomy  . Multiple myeloma ([place]) 08/2015  . Multiple myeloma ([place])   . Pain aggravated by activities of daily  living    states she pulled something at right sternum in dec 2016  . PONV (postoperative nausea and vomiting)    thinks morphine caused nausea  . Prediabetes   . Urinary incontinence     Past Surgical History:  Procedure Laterality Date  . ABDOMINAL HYSTERECTOMY  1997   TAH--ovaries remain--Dr. Newton Pigg  . THYMECTOMY  1999    MEDS:   Current Outpatient Prescriptions on File Prior to Visit  Medication Sig Dispense Refill  . aspirin EC 81 MG tablet Take 81 mg by mouth daily.    . Bisacodyl (DULCOLAX PO) Take by mouth. 4 daily    . cholecalciferol (VITAMIN D) 1000 units tablet Take 2,000 Units by mouth daily.    . fluticasone (FLONASE) 50 MCG/ACT nasal spray Place into the nose.    . Magnesium 400 MG CAPS Take 1 tablet by mouth daily.    . Polyethylene Glycol 3350 (MIRALAX PO) Take by mouth.     No current facility-administered medications on file prior to visit.     ALLERGIES: Penicillins and Ampicillin  Family History  Problem Relation Age of Onset  . COPD Father        dec age 4/s-smoked  . Diabetes Brother   . Other Brother        committed suicide age 63  . Hyperlipidemia Mother   . Diabetes Maternal Grandmother     SH:  Married, non smoker  Review of Systems  Gastrointestinal: Negative.   Genitourinary:       Vaginal spotting    PHYSICAL EXAMINATION:  BP 110/60 (BP Location: Right Arm, Patient Position: Sitting, Cuff Size: Normal)   Pulse 76   Resp 16   Wt 135 lb (61.2 kg)   BMI 21.14 kg/m     General appearance: alert, cooperative and appears stated age Abdomen: soft, non-tender; bowel sounds normal; no masses,  no organomegaly  Pelvic: External genitalia:  no lesions              Urethra:  normal appearing urethra with no masses, tenderness or lesions              Bartholins and Skenes: normal                 Vagina: pessary removed (and cleansed), about one inch thin area of granulation tissue right at apex, no other lesions noted               Cervix: absent              Bimanual Exam:  Uterus:  uterus absent              Adnexa: no mass, fullness, tenderness.              Anus:  no lesions  D/w pt removal of pessary is likely best way to have this heal quickly but due to symptoms she experiences when pessary is out d/w pt removing pessary every night and leaving out.  She is not sure she can do this but she does feel she can leave out all weekend and maybe take out one night weekly.  She will place the estrogen cream when the pessary is out.    Assessment: Vaginal bleeding from granulation tissue at vaginal apex Vaginal vault prolapse after hysterectomy Incontinence ring with support pessary use  Plan: Return one month with Dr. Quincy Simmonds.

## 2017-06-03 NOTE — Progress Notes (Signed)
[facility]    HEMATOLOGY/ONCOLOGY CLINIC NOTE  Date of Service: .05/25/2017  Patient Care Team: [name], MD as PCP - General (Family Medicine)  CHIEF COMPLAINTS/PURPOSE OF CONSULTATION:  Follow up for multiple myeloma  DIAGNOSIS  IgA lambda R-ISS stage II multiple myeloma with innumerable lytic lesions in the calvarium, lesion in T7 vertebra and multiple lesions in the pelvis.  No significant bone pain at this time. Diagnosed in January 2017.  Current treatment  -Revlimid maintenance 37m po daily  Previous Treatment  Status post Vd x 1 cycle VRd x 6 cycles  HD Melphalan 2066mm2 on 03/22/2016 Autologous HS[facility] 03/23/2016 [facility]^6[facility]kg)    HISTORY OF PRESENTING ILLNESS: Please see my initial consultation for details of her initial presentation.  INTERVAL HISTORY  Paula Johnson here for her scheduled followup for multiple myeloma. [facility] Notes fatigue from Revlimid. Changed to Revlimid 1557mo daily 3weeks on 1 week off. Received post transplant vaccinations. No fevers/chills/night sweats/weight loss. Was recommended to be on Zometa q2mo47monthoitnues to take calcium and Vit D. No other acute new concerns.  MEDICAL HISTORY:  Past Medical History:  Diagnosis Date  . Abnormal Pap smear of vagina 03/22/2017   ASCUS pap and negative HR HPV.  Patient is on immunosuppressive medication.   . Cancer ([facility])[place]99   squamous cell carcinoma of thymus  . Fibroid    reason for Hysterectomy  . Multiple myeloma ([facility])[place]/2016  . Multiple myeloma ([facility])[place]. Pain aggravated by activities of daily living    states she pulled something at right sternum in dec 2016  . PONV (postoperative nausea and vomiting)    thinks morphine caused nausea  . Prediabetes   . Urinary incontinence     SURGICAL HISTORY: Past Surgical History:  Procedure Laterality Date  . ABDOMINAL HYSTERECTOMY  1997   TAH--ovaries remain--Dr. ThomNewton PiggTHYMECTOMY  1999    SOCIAL HISTORY: Social History   Social History  . Marital status: Married    Spouse name: N/A  . Number of children: N/A  . Years of education: N/A   Occupational History  . Not on file.   Social History Main Topics  . Smoking status: Never Smoker  . Smokeless tobacco: Never Used  . Alcohol use No  . Drug use: No  . Sexual activity: No     Comment: TAH--ovaries remain   Other Topics Concern  . Not on file   Social History Narrative  . No narrative on file    FAMILY HISTORY: Family History  Problem Relation Age of Onset  . COPD Father        dec age 64/s-smoked  . Diabetes Brother   . Other Brother        committed suicide age 64  6Hyperlipidemia Mother   . Diabetes Maternal Grandmother     ALLERGIES:  is allergic to penicillins and ampicillin.  MEDICATIONS:  Current Outpatient Prescriptions  Medication Sig Dispense Refill  . aspirin EC 81 MG tablet Take 81 mg by mouth daily.    . Bisacodyl (DULCOLAX PO) Take by mouth. 4 daily    . cholecalciferol (VITAMIN D) 1000 units tablet Take 2,000 Units by mouth daily.    . fluticasone (FLONASE) 50 MCG/ACT nasal spray Place into the nose.    . lenalidomide (REVLIMID) 15 MG capsule Take 1 capsule (15 mg total) by mouth  daily. Auth # [id number] 21 capsule 0  . Magnesium 400 MG CAPS Take 1 tablet by mouth daily.    . metroNIDAZOLE (METROGEL) 0.75 % vaginal gel Place 1 Applicatorful vaginally at bedtime. Use for 5 nights. 70 g 0  . Polyethylene Glycol 3350 (MIRALAX PO) Take by mouth.     No current facility-administered medications for this visit.     REVIEW OF SYSTEMS:    10 Point review of Systems was done is negative except as noted above.  PHYSICAL EXAMINATION: ECOG PERFORMANCE STATUS: 1 - Symptomatic but completely ambulatory  .VSS as per EPIC GENERAL:alert, in no acute distress and comfortable SKIN: skin color, texture, turgor are normal, no  rashes or significant lesions EYES: normal, conjunctiva are pink and non-injected, sclera clear OROPHARYNX:no exudate, no erythema and lips, buccal mucosa, and tongue normal  NECK: supple, no JVD, thyroid normal size, non-tender, without nodularity LYMPH:  no palpable lymphadenopathy in the cervical, axillary or inguinal LUNGS: clear to auscultation with normal respiratory effort HEART: regular rate & rhythm,  no murmurs and no lower extremity edema ABDOMEN: abdomen soft, non-tender, normoactive bowel sounds  Musculoskeletal: no cyanosis of digits and no clubbing  PSYCH: alert & oriented x 3 with fluent speech NEURO: no focal motor/sensory deficits  LABORATORY DATA:  I have reviewed the data as listed . CBC Latest Ref Rng & Units 05/25/2017 03/19/2017 01/09/2017  WBC 3.9 - 10.3 10e3/uL 2.6(L) 3.4(L) 3.5(L)  Hemoglobin 11.6 - 15.9 g/dL 12.7 13.2 13.3  Hematocrit 34.8 - 46.6 % 38.4 40.0 40.0  Platelets 145 - 400 10e3/uL 214 229 300   ANC 2.2k . CBC    Component Value Date/Time   WBC 2.6 (L) 05/25/2017 0838   WBC 2.3 (L) 01/28/2016 0720   RBC 3.94 05/25/2017 0838   RBC 3.79 (L) 01/28/2016 0720   HGB 12.7 05/25/2017 0838   HCT 38.4 05/25/2017 0838   PLT 214 05/25/2017 0838   MCV 97.5 05/25/2017 0838   MCH 32.2 05/25/2017 0838   MCH 30.6 01/28/2016 0720   MCHC 33.1 05/25/2017 0838   MCHC 32.2 01/28/2016 0720   RDW 14.3 05/25/2017 0838   LYMPHSABS 0.7 (L) 05/25/2017 0838   MONOABS 0.4 05/25/2017 0838   EOSABS 0.2 05/25/2017 0838   BASOSABS 0.0 05/25/2017 0838     . CMP Latest Ref Rng & Units 05/25/2017 05/25/2017 03/19/2017  Glucose 70 - 140 mg/dl 73 - 82  BUN 7.0 - 26.0 mg/dL 13.1 - 17.4  Creatinine 0.6 - 1.1 mg/dL 0.8 - 0.8  Sodium 136 - 145 mEq/L 139 - 142  Potassium 3.5 - 5.1 mEq/L 4.1 - 4.0  Chloride 101 - 111 mmol/L - - -  CO2 22 - 29 mEq/L 26 - 26  Calcium 8.4 - 10.4 mg/dL 9.4 - 9.8  Total Protein 6.4 - 8.3 g/dL 7.2 6.8 7.3  Total Bilirubin 0.20 - 1.20 mg/dL 1.04 -  0.81  Alkaline Phos 40 - 150 U/L 76 - 79  AST 5 - 34 U/L 16 - 16  ALT 0 - 55 U/L 19 - 17         RADIOGRAPHIC STUDIES: I have personally reviewed the radiological images as listed and agreed with the findings in the report. No results found.  ASSESSMENT & PLAN:   61 year old Caucasian female with  1) R-ISS Stage II IgA lambda multiple myeloma with standard risk cytogenetics t (11;14) currently in remission.  ONCOLOGIC HISTORY On diagnosis PET/CT showed evidence of lytic bone lesions in the calvarium,  iliac bones  and T7. No hypercalcemia or renal failure at this time. Pretreatment M spike of 5 in 2 monoclonal bands. BM bx with 69% plasma cells with Lambda light chain restriction Patient is status post cycle 1 of Vd and 5 cycles of VRd and has she tolerated well other than some grade 1 rash. Also noted to have fevers from Velcade which are controlled with naproxen.  S/p 6 cycles of VRd- VGPR pre-transplant S[facility]  Myeloma panel 05/25/2017 -- no M protein and IFE -polyclonal gammopathy and nl K/L SFC ratio. #2 anemia due to multiple myeloma and treatment medications- resolved #3 fatigue due to Revlimid with mild leucopenia -- Revlimid has been changed to 3weeks on 1 week off. #4 h/o multiple bone metastases from multiple myeloma - on zometa q247monthper [facility] transplant team. #5 Intermittent Muscle cramps - could be from Revlimid/dehydration.-resolved. Plan -Patient is doing well with stable labs. No M spike and normal serum free light chain ratio. - Due to grade 2 fatigue and leucopenia maintenance Revlimid was changed to 15 mg po daily 3 weeks on 1 week off. --continue ASA daily for VTE prophylaxis while on Revlimid -zometa q2m53monthContinue acyclovir VZV prophylaxis for 1 year post-transplant for an additional couple of months. -had recent post transplant vaccination. Continue vaccinations per protocol -counseled on  infection prevention strategies.  -Please schedule Zometa q2mo91montharting 06/01/2017 (patient requests Friday Mornings) -RTC with Dr KaleIrene Limbo2 months with labs and subsequent dose of zometa.    I spent 20 minutes counseling the patient face to face. The total time spent in the appointment was 25 minutes and more than 50% was on counseling and direct patient cares.    GautSullivan LoneMS ALake WildwoodIVMS SCH [facility] Hospitalatology/Oncology Physician [facility] Ashlandffice):       [phone] cell):  [phone]):           [phone]

## 2017-06-04 ENCOUNTER — Encounter: Payer: Self-pay | Admitting: Obstetrics & Gynecology

## 2017-06-06 ENCOUNTER — Other Ambulatory Visit: Payer: Self-pay | Admitting: Hematology

## 2017-06-08 ENCOUNTER — Ambulatory Visit (HOSPITAL_BASED_OUTPATIENT_CLINIC_OR_DEPARTMENT_OTHER): Payer: 59

## 2017-06-08 DIAGNOSIS — C9000 Multiple myeloma not having achieved remission: Secondary | ICD-10-CM

## 2017-06-08 DIAGNOSIS — C9001 Multiple myeloma in remission: Secondary | ICD-10-CM | POA: Diagnosis not present

## 2017-06-08 MED ORDER — SODIUM CHLORIDE 0.9 % IV SOLN
Freq: Once | INTRAVENOUS | Status: AC
  Administered 2017-06-08: 09:00:00 via INTRAVENOUS

## 2017-06-08 MED ORDER — ZOLEDRONIC ACID 4 MG/100ML IV SOLN
4.0000 mg | Freq: Once | INTRAVENOUS | Status: AC
  Administered 2017-06-08: 4 mg via INTRAVENOUS
  Filled 2017-06-08: qty 100

## 2017-06-15 ENCOUNTER — Ambulatory Visit: Payer: Self-pay | Admitting: Obstetrics and Gynecology

## 2017-06-26 ENCOUNTER — Other Ambulatory Visit: Payer: Self-pay | Admitting: Hematology

## 2017-06-26 DIAGNOSIS — C9000 Multiple myeloma not having achieved remission: Secondary | ICD-10-CM

## 2017-07-20 ENCOUNTER — Telehealth: Payer: Self-pay | Admitting: Hematology

## 2017-07-20 ENCOUNTER — Telehealth: Payer: Self-pay

## 2017-07-20 ENCOUNTER — Telehealth: Payer: Self-pay | Admitting: Emergency Medicine

## 2017-07-20 NOTE — Telephone Encounter (Signed)
This nurse called Dr. Kendell Bane nurse navigator @ [facility] for clarification on which immunizations need boosters for this pt. VM left to call [facility] RN back.

## 2017-07-20 NOTE — Telephone Encounter (Signed)
Pt called requesting information regarding scheduled vaccinations from Dr. Kendell Bane office at [facility]. Found required vaccinations in Office Note by Dr. Alvie Heidelberg in [place]. Confirmed with Arbie Cookey [facility] that we will have all of the vaccines available on Monday. John, RN in injection made aware of potential add-on Monday-Wednesday. Confirmed correct vaccinations via Jackelyn Poling, Nurse Navigator at Dr. Kendell Bane office. The pt was also notified through VM of what she should be receiving and to be expecting a call for appt date/time. High priority scheduling message sent for injection appt.  The following are vaccinations expected for month 14 (October) post-transplant:  Tdap or DTaP 0.5 ml IM  Hib conjugate (HIB) 0.45ml IM  IPV (IPOL) 0.76ml SQ or IM  Hepatitis B Energex B 31mcg/ml 53ml IM  Pneumococcal 13-valent conjugate (Prevnar 13) 0.66ml IM   Message sent to desk nurse and physician as a reminder to add orders.

## 2017-07-20 NOTE — Telephone Encounter (Signed)
Left message for patient regarding appt added per 10/26 sch msg.

## 2017-07-24 ENCOUNTER — Other Ambulatory Visit: Payer: Self-pay | Admitting: Hematology

## 2017-07-24 ENCOUNTER — Ambulatory Visit (HOSPITAL_BASED_OUTPATIENT_CLINIC_OR_DEPARTMENT_OTHER): Payer: 59

## 2017-07-24 VITALS — BP 134/68 | HR 83 | Temp 98.0°F | Resp 20

## 2017-07-24 DIAGNOSIS — Z23 Encounter for immunization: Secondary | ICD-10-CM

## 2017-07-24 DIAGNOSIS — C9000 Multiple myeloma not having achieved remission: Secondary | ICD-10-CM

## 2017-07-24 MED ORDER — HEPATITIS B VAC RECOMBINANT 20 MCG/ML IJ SUSP
1.0000 mL | Freq: Once | INTRAMUSCULAR | Status: AC
  Administered 2017-07-24: 20 ug via INTRAMUSCULAR
  Filled 2017-07-24: qty 1

## 2017-07-24 MED ORDER — PNEUMOCOCCAL 13-VAL CONJ VACC IM SUSP
0.5000 mL | Freq: Once | INTRAMUSCULAR | Status: AC
  Administered 2017-07-24: 0.5 mL via INTRAMUSCULAR
  Filled 2017-07-24: qty 0.5

## 2017-07-24 MED ORDER — HAEMOPHILUS B POLYSAC CONJ VAC IM SOLR
0.5000 mL | Freq: Once | INTRAMUSCULAR | Status: AC
  Administered 2017-07-24: 0.5 mL via INTRAMUSCULAR
  Filled 2017-07-24: qty 0.5

## 2017-07-24 MED ORDER — DTAP-IPV VACCINE IM SUSP
0.5000 mL | Freq: Once | INTRAMUSCULAR | Status: AC
  Administered 2017-07-24: 0.5 mL via INTRAMUSCULAR
  Filled 2017-07-24: qty 0.5

## 2017-07-24 NOTE — Patient Instructions (Signed)
Diphtheria, Tetanus, Acellular Pertussis; Haemophilus; Poliovirus Vaccine Qu es este medicamento? La combinacin VACUNA ANTIDIFTRICA, ANTITETNICA, ANTITOSFERNICA ACELULAR DTaP; [employer] CONJUGADA CONTRA HAEMOPHILUS INFLUENZAE TIPO B; VACUNA ANTIPOLIMIELTICA INACTIVADA, IPV se [place] para prevenir infecciones de la difteria, el ttanos, la tos [place], el haemophilus influenza tipo b y la polio. Este medicamento puede ser utilizado para otros usos; si tiene alguna pregunta consulte con su proveedor de atencin mdica o con su farmacutico. MARCAS COMUNES: [employer] Qu le debo informar a mi profesional de la salud antes de tomar este medicamento? Necesita saber si usted presenta alguno de los [employer] o situaciones: -trastornos sanguneos, como la hemofilia -fiebre o infeccin -problemas del sistema inmunolgico -enfermedad neurolgica -toma medicamentos que tratan o previenen cogulos sanguneos -convulsiones -una reaccin alrgica o inusual a la vacuna antidiftrica, antitetnica, antitosfernica acelular, DTaP; vacuna conjugada contra Haemophilus influenzae tipo b; vacuna antipolimieltica inactivada, IPV, a otros medicamentos, a la neomicina, a la polimixina B, a la albmina, al polisorbato 80, alimentos, colorantes o conservantes -si est embarazada o buscando quedar embarazada -si est amamantando a un beb Cmo debo utilizar este medicamento? Esta vacuna se administra mediante inyeccin por va intramuscular. Lo administra un profesional de [employer]	[place]. Recibir una copia de informacin escrita sobre la vacuna antes de cada vacuna. Asegrese de leer este folleto cada vez cuidadosamente. Este folleto puede cambiar con frecuencia. Hable con su pediatra para informarse acerca del uso de este medicamento en nios. Aunque este medicamento ha sido recetado a nios tan menores como de 6 semanas de edad para condiciones selectivas, las precauciones se aplican. Sobredosis: Pngase en  contacto inmediatamente con un centro toxicolgico o una sala de urgencia si usted cree que haya tomado demasiado medicamento. ATENCIN: [employer] es solo para usted. No comparta este medicamento con nadie. Qu sucede si me olvido de una dosis? Mantenga sus citas para los siguentes dosis (dosis de refuerzo) Human resources officer indicado. Es importante de no olvidar ninguna dosis. Informe a su mdico o a su profesional de la salud si no puede asistir a Photographer. Qu puede interactuar con este medicamento? -medicamentos que suprimen el sistema inmunolgico, tales como adalimumab, anakinra, infliximab, rilonacept -medicamentos para tratar el cncer -medicamentos esteroideos, como la prednisona o la cortisona Puede ser que esta lista no menciona todas las posibles interacciones. Informe a su profesional de [employer]	[place] de [employer] productos a base de hierbas, medicamentos de [place] o suplementos nutritivos que est tomando. Si usted fuma, consume bebidas alcohlicas o si utiliza drogas ilegales, indqueselo tambin a su profesional de [employer]	[place]. Algunas sustancias pueden interactuar con su medicamento. A qu debo estar atento al usar [employer]? Si se presenta algn efecto secundario grave, comunquese con su mdico o con su profesional de la salud y busque asistencia mdica de Freight forwarder. Es posible que esta [place], como todas las vacunas, no protejan completamente a todos. Qu efectos secundarios puedo tener al [employer] este medicamento? Efectos secundarios que debe informar a su mdico o a Barrister's clerk de la salud tan pronto como sea posible: -Chief of Staff como erupcin cutnea, picazn o urticarias, hinchazn de la cara, labios o lengua -problemas respiratorios -fiebre ms de 103 grados F -llanto inconsolable durante 3 horas o ms -convulsiones -cansancio o debilidad inusual Efectos secundarios que, por lo general, no requieren atencin mdica inmediata (debe informarlos a  su mdico o a su profesional de la salud si persisten o si son molestos): -magulladuras, dolor, hinchazn en el lugar de la inyeccin -quisquilloso -prdida del apetito -  fiebre baja -sooliento -vmito Puede ser que esta lista no menciona todos los posibles efectos secundarios. Comunquese a su mdico por asesoramiento mdico [employer] Usted puede informar los efectos secundarios a la FDA por telfono al 1-800-FDA-1088. Dnde debo guardar mi medicina? Esta vacuna se administrar en una clnica, farmacia, o en el consultorio de un mdico o un profesional de la salud. No se le entregar la vacuna para guardar en su domicilio. ATENCIN: Este folleto es un resumen. Puede ser que no cubra toda la posible informacin. Si usted tiene preguntas acerca de esta medicina, consulte con su mdico, su farmacutico o su profesional de Technical sales engineer.  2018 Elsevier/Gold Standard (2014-11-04 00:00:00) Haemophilus influenzae type b Conjugate Vaccine injection What is this medicine? HAEMOPHILUS INFLUENZAE TYPE B CONJUGATE VACCINE (hem OFF fil [place] in floo En zuh type B KAN ji get VAK seen) is used to prevent infections of a Haemophilus bacteria. This medicine may be used for other purposes; ask your health care provider or pharmacist if you have questions. COMMON BRAND NAME(S): ActHIB, Hiberix, HibTITER, PedvaxHIB What should I tell my health care provider before I take this medicine? They need to know if you have any of these conditions: -bleeding disorder -Guillain-Barre syndrome -immune system problems -infection with fever -low levels of platelets in the blood -take medicines that treat or prevent blood clots -an unusual or allergic reaction to vaccines, other medicines, foods, dyes, or preservatives -pregnant or trying to get pregnant -breast-feeding How should I use this medicine? This vaccine is for injection into a muscle. It is given by a health care professional. A copy of  Vaccine Information Statements will be given before each vaccination. Read this sheet carefully each time. The sheet may change frequently. Talk to your pediatrician regarding the use of this medicine in children. While this drug may be prescribed for children as young as 32 months old for selected conditions, precautions do apply. Overdosage: If you think you have taken too much of this medicine contact a poison control center or emergency room at once. NOTE: This medicine is only for you. Do not share this medicine with others. What if I miss a dose? Keep appointments for follow-up (booster) doses as directed. It is important not to miss your dose. Call your doctor or health care professional if you are unable to keep an appointment. What may interact with this medicine? -adalimumab -anakinra -infliximab -medicines that suppress your immune system -medicines that treat or prevent blood clots like warfarin, enoxaparin, and dalteparin -medicines to treat cancer This list may not describe all possible interactions. Give your health care provider a list of all the medicines, herbs, non-prescription drugs, or dietary supplements you use. Also tell them if you smoke, drink alcohol, or use illegal drugs. Some items may interact with your medicine. What should I watch for while using this medicine? Visit your doctor for regular check-ups as directed. This vaccine, like all vaccines, may not fully protect everyone. What side effects may I notice from receiving this medicine? Side effects that you should report to your doctor or health care professional as soon as possible: -allergic reactions like skin rash, itching or hives, swelling of the face, lips, or tongue -breathing problems -extreme changes in behavior -fever over 100 degrees F -pain, tingling, numbness in the hands or feet -seizures -unusually weak or tired Side effects that usually do not require medical attention (report to your doctor  or health care professional if they continue or are bothersome): -aches  or pains -bruising, pain, swelling at site where injected -diarrhea -headache -loss of appetite -low-grade fever of 100 degrees F or less -nausea, vomiting -sleepy This list may not describe all possible side effects. Call your doctor for medical advice about side effects. You may report side effects to FDA at 1-800-FDA-1088. Where should I keep my medicine? This drug is given in a hospital or clinic and will not be stored at home. NOTE: This sheet is a summary. It may not cover all possible information. If you have questions about this medicine, talk to your doctor, pharmacist, or health care provider.  2018 Elsevier/Gold Standard (2014-01-12 13:43:01) Hepatitis A; Hepatitis B Vaccine injection What is this medicine? HEPATITIS A VACCINE; HEPATITIS B VACCINE (hep uh TAHY tis A vak SEEN; hep uh TAHY tis B vak SEEN) is a vaccine to protect from an infection with the hepatitis A and B virus. This vaccine does not contain the live viruses. It will not cause a hepatitis infection. This medicine may be used for other purposes; ask your health care provider or pharmacist if you have questions. COMMON BRAND NAME(S): Twinrix What should I tell my health care provider before I take this medicine? They need to know if you have any of these conditions: -bleeding disorder -fever or infection -heart disease -immune system problems -an unusual or allergic reaction to hepatitis A or B vaccine, neomycin, yeast, thimerosal, other medicines, foods, dyes, or preservatives -pregnant or trying to get pregnant -breast-feeding How should I use this medicine? This vaccine is for injection into a muscle. It is given by a health care professional. A copy of Vaccine Information Statements will be given before each vaccination. Read this sheet carefully each time. The sheet may change frequently. Talk to your pediatrician regarding the use of  this medicine in children. Special care may be needed. Overdosage: If you think you have taken too much of this medicine contact a poison control center or emergency room at once. NOTE: This medicine is only for you. Do not share this medicine with others. What if I miss a dose? It is important not to miss your dose. Call your doctor or health care professional if you are unable to keep an appointment. What may interact with this medicine? -medicines that suppress your immune function like adalimumab, anakinra, infliximab -medicines to treat cancer -steroid medicines like prednisone or cortisone This list may not describe all possible interactions. Give your health care provider a list of all the medicines, herbs, non-prescription drugs, or dietary supplements you use. Also tell them if you smoke, drink alcohol, or use illegal drugs. Some items may interact with your medicine. What should I watch for while using this medicine? See your health care provider for all shots of this vaccine as directed. You must have 3 to 4 shots of this vaccine for protection from hepatitis A and B infection. Tell your doctor right away if you have any serious or unusual side effects after getting this vaccine. What side effects may I notice from receiving this medicine? Side effects that you should report to your doctor or health care professional as soon as possible: -allergic reactions like skin rash, itching or hives, swelling of the face, lips, or tongue -breathing problems -confused, irritated -fast, irregular heartbeat -flu-like syndrome -numb, tingling pain -seizures Side effects that usually do not require medical attention (report to your doctor or health care professional if they continue or are bothersome): -diarrhea -fever -headache -loss of appetite -muscle pain -nausea -pain, redness,  swelling, or irritation at site where injected -tiredness This list may not describe all possible side  effects. Call your doctor for medical advice about side effects. You may report side effects to FDA at 1-800-FDA-1088. Where should I keep my medicine? This drug is given in a hospital or clinic and will not be stored at home. NOTE: This sheet is a summary. It may not cover all possible information. If you have questions about this medicine, talk to your doctor, pharmacist, or health care provider.  2018 Elsevier/Gold Standard (2008-01-24 15:21:37) Pneumococcal Conjugate Vaccine suspension for injection What is this medicine? PNEUMOCOCCAL VACCINE (NEU mo KOK al vak SEEN) is a vaccine used to prevent pneumococcus bacterial infections. These bacteria can cause serious infections like pneumonia, meningitis, and blood infections. This vaccine will lower your chance of getting pneumonia. If you do get pneumonia, it can make your symptoms milder and your illness shorter. This vaccine will not treat an infection and will not cause infection. This vaccine is recommended for infants and young children, adults with certain medical conditions, and adults 70 years or older. This medicine may be used for other purposes; ask your health care provider or pharmacist if you have questions. COMMON BRAND NAME(S): Prevnar, Prevnar 13 What should I tell my health care provider before I take this medicine? They need to know if you have any of these conditions: -bleeding problems -fever -immune system problems -an unusual or allergic reaction to pneumococcal vaccine, diphtheria toxoid, other vaccines, latex, other medicines, foods, dyes, or preservatives -pregnant or trying to get pregnant -breast-feeding How should I use this medicine? This vaccine is for injection into a muscle. It is given by a health care professional. A copy of Vaccine Information Statements will be given before each vaccination. Read this sheet carefully each time. The sheet may change frequently. Talk to your pediatrician regarding the use of  this medicine in children. While this drug may be prescribed for children as young as 9 weeks old for selected conditions, precautions do apply. Overdosage: If you think you have taken too much of this medicine contact a poison control center or emergency room at once. NOTE: This medicine is only for you. Do not share this medicine with others. What if I miss a dose? It is important not to miss your dose. Call your doctor or health care professional if you are unable to keep an appointment. What may interact with this medicine? -medicines for cancer chemotherapy -medicines that suppress your immune function -steroid medicines like prednisone or cortisone This list may not describe all possible interactions. Give your health care provider a list of all the medicines, herbs, non-prescription drugs, or dietary supplements you use. Also tell them if you smoke, drink alcohol, or use illegal drugs. Some items may interact with your medicine. What should I watch for while using this medicine? Mild fever and pain should go away in 3 days or less. Report any unusual symptoms to your doctor or health care professional. What side effects may I notice from receiving this medicine? Side effects that you should report to your doctor or health care professional as soon as possible: -allergic reactions like skin rash, itching or hives, swelling of the face, lips, or tongue -breathing problems -confused -fast or irregular heartbeat -fever over 102 degrees F -seizures -unusual bleeding or bruising -unusual muscle weakness Side effects that usually do not require medical attention (report to your doctor or health care professional if they continue or are bothersome): -aches and pains -  diarrhea -fever of 102 degrees F or less -headache -irritable -loss of appetite -pain, tender at site where injected -trouble sleeping This list may not describe all possible side effects. Call your doctor for medical  advice about side effects. You may report side effects to FDA at 1-800-FDA-1088. Where should I keep my medicine? This does not apply. This vaccine is given in a clinic, pharmacy, doctor's office, or other health care setting and will not be stored at home. NOTE: This sheet is a summary. It may not cover all possible information. If you have questions about this medicine, talk to your doctor, pharmacist, or health care provider.  2018 Elsevier/Gold Standard (2014-06-18 10:27:27)

## 2017-07-25 ENCOUNTER — Other Ambulatory Visit: Payer: Self-pay | Admitting: Hematology

## 2017-07-25 DIAGNOSIS — C9000 Multiple myeloma not having achieved remission: Secondary | ICD-10-CM

## 2017-07-30 ENCOUNTER — Telehealth: Payer: Self-pay

## 2017-07-30 MED ORDER — LORAZEPAM 0.5 MG PO TABS: 0.5000 mg | ORAL_TABLET | Freq: Four times a day (QID) | ORAL | 0 refills | Status: DC | PRN

## 2017-07-30 NOTE — Telephone Encounter (Signed)
S/w Dr Irene Limbo and ordered new lorazepam Rx and pt to be seen on Friday. inbasket sent to scheduler.

## 2017-07-30 NOTE — Telephone Encounter (Signed)
Pt called with problems with vaccinations she received 10/30. She had fever 101.1 that night that then went away. She has had a mild headache since. Last 3 nights not able to sleep. A jittery feeling, can't sit still, can't lay still. Her stomach feels jittery too, almost like feelings of the other shoe is about to drop- some anxiety in there too.  Will doze for 30 minutes at a time. Has appt on 16th. Can we move it up to this Friday.   Melatonin does not work. May have left over lorazepam from transplant last year June.   Taking revlimid for last 7 days.

## 2017-08-03 ENCOUNTER — Ambulatory Visit (HOSPITAL_BASED_OUTPATIENT_CLINIC_OR_DEPARTMENT_OTHER): Payer: 59 | Admitting: Hematology

## 2017-08-03 ENCOUNTER — Encounter: Payer: Self-pay | Admitting: Hematology

## 2017-08-03 ENCOUNTER — Other Ambulatory Visit (HOSPITAL_BASED_OUTPATIENT_CLINIC_OR_DEPARTMENT_OTHER): Payer: 59

## 2017-08-03 VITALS — BP 131/74 | HR 75 | Temp 97.8°F | Resp 20 | Ht 67.0 in | Wt 129.9 lb

## 2017-08-03 DIAGNOSIS — G4700 Insomnia, unspecified: Secondary | ICD-10-CM | POA: Diagnosis not present

## 2017-08-03 DIAGNOSIS — C9001 Multiple myeloma in remission: Secondary | ICD-10-CM | POA: Diagnosis not present

## 2017-08-03 DIAGNOSIS — Z7189 Other specified counseling: Secondary | ICD-10-CM | POA: Insufficient documentation

## 2017-08-03 HISTORY — DX: Other specified counseling: Z71.89

## 2017-08-03 LAB — CBC & DIFF AND RETIC
BASO%: 1.6 % (ref 0.0–2.0)
BASOS ABS: 0.1 10*3/uL (ref 0.0–0.1)
EOS ABS: 0.3 10*3/uL (ref 0.0–0.5)
EOS%: 7.5 % — ABNORMAL HIGH (ref 0.0–7.0)
HEMATOCRIT: 40.5 % (ref 34.8–46.6)
HEMOGLOBIN: 13.4 g/dL (ref 11.6–15.9)
Immature Retic Fract: 6.6 % (ref 1.60–10.00)
LYMPH#: 1 10*3/uL (ref 0.9–3.3)
LYMPH%: 25.8 % (ref 14.0–49.7)
MCH: 31.5 pg (ref 25.1–34.0)
MCHC: 33.1 g/dL (ref 31.5–36.0)
MCV: 95.3 fL (ref 79.5–101.0)
MONO#: 0.4 10*3/uL (ref 0.1–0.9)
MONO%: 11.6 % (ref 0.0–14.0)
NEUT#: 2 10*3/uL (ref 1.5–6.5)
NEUT%: 53.5 % (ref 38.4–76.8)
Platelets: 233 10*3/uL (ref 145–400)
RBC: 4.25 10*6/uL (ref 3.70–5.45)
RDW: 14.4 % (ref 11.2–14.5)
RETIC %: 1.75 % (ref 0.70–2.10)
RETIC CT ABS: 74.38 10*3/uL (ref 33.70–90.70)
WBC: 3.7 10*3/uL — AB (ref 3.9–10.3)

## 2017-08-03 LAB — COMPREHENSIVE METABOLIC PANEL
ALT: 40 U/L (ref 0–55)
AST: 15 U/L (ref 5–34)
Albumin: 3.9 g/dL (ref 3.5–5.0)
Alkaline Phosphatase: 126 U/L (ref 40–150)
Anion Gap: 10 mEq/L (ref 3–11)
BUN: 13.5 mg/dL (ref 7.0–26.0)
CALCIUM: 9.2 mg/dL (ref 8.4–10.4)
CHLORIDE: 105 meq/L (ref 98–109)
CO2: 23 meq/L (ref 22–29)
CREATININE: 0.8 mg/dL (ref 0.6–1.1)
EGFR: >60 mL/min/{1.73_m2} (ref >60)
GLUCOSE: 156 mg/dL — AB (ref 70–140)
Potassium: 3.4 mEq/L — ABNORMAL LOW (ref 3.5–5.1)
SODIUM: 137 meq/L (ref 136–145)
Total Bilirubin: 1.1 mg/dL (ref 0.20–1.20)
Total Protein: 8 g/dL (ref 6.4–8.3)

## 2017-08-03 MED ORDER — TRAZODONE HCL 50 MG PO TABS: 50.0000 mg | ORAL_TABLET | Freq: Every evening | ORAL | 1 refills | Status: DC | PRN

## 2017-08-03 NOTE — Progress Notes (Signed)
Paula Johnson    HEMATOLOGY/ONCOLOGY CLINIC NOTE  Date of Service: 08/03/17  Patient Care Team: [name], MD as PCP - General (Family Medicine)  CHIEF COMPLAINTS/PURPOSE OF CONSULTATION:  Follow up for multiple myeloma  DIAGNOSIS  IgA lambda R-ISS stage II multiple myeloma with innumerable lytic lesions in the calvarium, lesion in T7 vertebra and multiple lesions in the pelvis.  No significant bone pain at this time. Diagnosed in January 2017.  Current treatment  -Revlimid maintenance 69m po daily  Previous Treatment  Status post Vd x 1 cycle VRd x 6 cycles  HD Melphalan 2065mm2 on 03/22/2016 Autologous HSCT on 03/23/2016 with Dr [facility]ule [facility] 6.4 x10^6/kg)    HISTORY OF PRESENTING ILLNESS: Please see my initial consultation for details of her initial presentation.  INTERVAL HISTORY  Paula Johnson here for her scheduled followup for multiple myeloma. Overall, she reports that since she received her vaccinations that she hasn't felt quite right. She does note that she has been somewhat anxious, jittery, and uneasy at home. She describes this as her having to get up and move every so often. This has been present throughout the week just following her vaccination. Otherwise she has been without fever, chills. She does have some mild URI-like symptoms currently, but this has been resolving. She states that she has lost some weight, however, she has been without appetite as well. She has been trying to force herself to eat anyway. She slept well last night, but she also states that she has been waking up every 1-2 hours. She was seen by Dr SwMoreen Fowlerarlier this week about her sleep issues who prescribed her Lunesta. She has not noticed this helping as of yet.    On review of systems, pt denies fever, chills, rash, mouth sores, urinary complaints. Denies pain. Pt denies abdominal pain, nausea, vomiting, changes in bowel/bladder habits. Positive for that as above.   MEDICAL  HISTORY:  Past Medical History:  Diagnosis Date  . Abnormal Pap smear of vagina 03/22/2017   ASCUS pap and negative HR HPV.  Patient is on immunosuppressive medication.   . [place]   squamous cell carcinoma of thymus  . Fibroid    reason for Hysterectomy  . [place]  . Pain aggravated by activities of daily living    states she pulled something at right sternum in dec 2016  . PONV (postoperative nausea and vomiting)    thinks morphine caused nausea  . Prediabetes   . Urinary incontinence     SURGICAL HISTORY: Past Surgical History:  Procedure Laterality Date  . ABDOMINAL HYSTERECTOMY  1997   TAH--ovaries remain--Dr. ThNewton Pigg. THYMECTOMY  1999    SOCIAL HISTORY: Social History   Socioeconomic History  . Marital status: Married    Spouse name: Not on file  . Number of children: Not on file  . Years of education: Not on file  . Highest education level: Not on file  Social Needs  . Financial resource strain: Not on file  . Food insecurity - worry: Not on file  . Food insecurity - inability: Not on file  . Transportation needs - medical: Not on file  . Transportation needs - non-medical: Not on file  Occupational History  . Not on file  Tobacco Use  . Smoking status: Never Smoker  . Smokeless tobacco: Never Used  Substance and Sexual Activity  . Alcohol use: No    Alcohol/week: 0.0 oz  . Drug  use: No  . Sexual activity: No    Partners: Male    Birth control/protection: Surgical    Comment: TAH--ovaries remain  Other Topics Concern  . Not on file  Social History Narrative  . Not on file    FAMILY HISTORY: Family History  Problem Relation Age of Onset  . COPD Father        dec age 34/s-smoked  . Diabetes Brother   . Other Brother        committed suicide age 25  . Hyperlipidemia Mother   . Diabetes Maternal Grandmother     ALLERGIES:  is allergic to penicillins and ampicillin.  MEDICATIONS:  Current Outpatient  Medications  Medication Sig Dispense Refill  . aspirin EC 81 MG tablet Take 81 mg by mouth daily.    . Bisacodyl (DULCOLAX PO) Take by mouth. 4 daily    . Calcium Carb-Cholecalciferol (CALCIUM+D3) 600-800 MG-UNIT TABS Take 2 (two) times daily by mouth.    . fluticasone (FLONASE) 50 MCG/ACT nasal spray Place into the nose.    . lenalidomide (REVLIMID) 15 MG capsule Take 1 capsule (15 mg total) by mouth daily. Auth# [id number] 21 capsule 0  . LORazepam (ATIVAN) 0.5 MG tablet Take 1 tablet (0.5 mg total) every 6 (six) hours as needed by mouth for anxiety. 30 tablet 0  . Magnesium 400 MG CAPS Take 1 tablet by mouth daily.    . Polyethylene Glycol 3350 (MIRALAX PO) Take by mouth.     No current facility-administered medications for this visit.     REVIEW OF SYSTEMS:    10 Point review of Systems was done is negative except as noted above.  PHYSICAL EXAMINATION: ECOG PERFORMANCE STATUS: 1 - Symptomatic but completely ambulatory  .VSS as per EPIC GENERAL:alert, in no acute distress and comfortable SKIN: skin color, texture, turgor are normal, no rashes or significant lesions EYES: normal, conjunctiva are pink and non-injected, sclera clear OROPHARYNX:no exudate, no erythema and lips, buccal mucosa, and tongue normal  NECK: supple, no JVD, thyroid normal size, non-tender, without nodularity LYMPH:  no palpable lymphadenopathy in the cervical, axillary or inguinal LUNGS: clear to auscultation with normal respiratory effort HEART: regular rate & rhythm,  no murmurs and no lower extremity edema ABDOMEN: abdomen soft, non-tender, normoactive bowel sounds  Musculoskeletal: no cyanosis of digits and no clubbing  PSYCH: alert & oriented x 3 with fluent speech NEURO: no focal motor/sensory deficits  LABORATORY DATA:  I have reviewed the data as listed . CBC Latest Ref Rng & Units 08/03/2017 05/25/2017 03/19/2017  WBC 3.9 - 10.3 10e3/uL 3.7(L) 2.6(L) 3.4(L)  Hemoglobin 11.6 - 15.9 g/dL 13.4 12.7  13.2  Hematocrit 34.8 - 46.6 % 40.5 38.4 40.0  Platelets 145 - 400 10e3/uL 233 214 229   ANC 2.2k . CBC    Component Value Date/Time   WBC 3.7 (L) 08/03/2017 0815   WBC 2.3 (L) 01/28/2016 0720   RBC 4.25 08/03/2017 0815   RBC 3.79 (L) 01/28/2016 0720   HGB 13.4 08/03/2017 0815   HCT 40.5 08/03/2017 0815   PLT 233 08/03/2017 0815   MCV 95.3 08/03/2017 0815   MCH 31.5 08/03/2017 0815   MCH 30.6 01/28/2016 0720   MCHC 33.1 08/03/2017 0815   MCHC 32.2 01/28/2016 0720   RDW 14.4 08/03/2017 0815   LYMPHSABS 1.0 08/03/2017 0815   MONOABS 0.4 08/03/2017 0815   EOSABS 0.3 08/03/2017 0815   BASOSABS 0.1 08/03/2017 0815     . CMP Latest Ref Rng &  Units 08/03/2017 05/25/2017 05/25/2017  Glucose 70 - 140 mg/dl 156(H) 73 -  BUN 7.0 - 26.0 mg/dL 13.5 13.1 -  Creatinine 0.6 - 1.1 mg/dL 0.8 0.8 -  Sodium 136 - 145 mEq/L 137 139 -  Potassium 3.5 - 5.1 mEq/L 3.4(L) 4.1 -  Chloride 101 - 111 mmol/L - - -  CO2 22 - 29 mEq/L 23 26 -  Calcium 8.4 - 10.4 mg/dL 9.2 9.4 -  Total Protein 6.4 - 8.3 g/dL 8.0 7.2 6.8  Total Bilirubin 0.20 - 1.20 mg/dL 1.10 1.04 -  Alkaline Phos 40 - 150 U/L 126 76 -  AST 5 - 34 U/L 15 16 -  ALT 0 - 55 U/L 40 19 -         RADIOGRAPHIC STUDIES: I have personally reviewed the radiological images as listed and agreed with the findings in the report. No results found.  ASSESSMENT & PLAN:   61 year old Caucasian female with  1) R-ISS Stage II IgA lambda multiple myeloma with standard risk cytogenetics t (11;14) currently in remission.  ONCOLOGIC HISTORY On diagnosis PET/CT showed evidence of lytic bone lesions in the calvarium, iliac bones  and T7. No hypercalcemia or renal failure at this time. Pretreatment M spike of 5 in 2 monoclonal bands. BM bx with 69% plasma cells with Lambda light chain restriction Patient is status post cycle 1 of Vd and 5 cycles of VRd and has she tolerated well other than some grade 1 rash. Also noted to have fevers from Velcade  which are controlled with naproxen.  S/p 6 cycles of VRd- VGPR pre-transplant S/p Melphalan 2103m/m2 [facility].  Myeloma panel 05/25/2017 -- no M protein and IFE -polyclonal gammopathy and nl K/L SFC ratio. #2 anemia due to multiple myeloma and treatment medications- resolved #3 fatigue due to Revlimid with mild leukopenia -- Revlimid has been changed to 3weeks on 1 week off. #4 h/o multiple bone metastases from multiple myeloma - on [facility] q256monthper [facility] team. #5 Intermittent Muscle cramps - could be from Revlimid/dehydration.-resolved. Plan -Patient is doing well with stable labs. No M spike and normal serum free light chain ratio. - continue on maintenance Revlimid was changed to 15 mg po daily 3 weeks on 1 week off. --continue ASA daily for VTE prophylaxis while on Revlimid -zometa q2m12monthContinue acyclovir VZV prophylaxis for 1 year post-transplant for an additional couple of months. -had recent post transplant vaccination. Continue vaccinations per protocol - [facility] -counseled on infection prevention strategies. -K is slightly borderline today at 3.4 (08/03/17). If this lowers any further then we will consider PO replacement. I also encouraged her to begin increasing her K intake at home in her diet.   #6 Insomnia, jittery/unease  -We will prescribe her Trazodone to see if this helps with her insomnia.  -I expect that her jittery and uneasy feeling is likely due to her recent vaccinations and her being unable to sleep. Trazodone will probably help with this as well.  -sleep hygiene counseling was given   RTC with Dr KalIrene Limbo 2 months with labs  Continue Zometa q8weeks  I spent 20 minutes counseling the patient face to face. The total time spent in the appointment was 25 minutes and more than 50% was on counseling and direct patient cares.  .I have reviewed the above documentation for  accuracy and completeness, and I agree with the above.  GauSullivan Lone  MD MS AAHIVMS [facility] Hematology/Oncology Physician [place]  (Office):       [phone] (Work cell):  [phone] (Fax):           [phone]  This document serves as a record of services personally performed by Sullivan Lone, MD. It was created on his behalf by Reola Mosher, a trained medical scribe. The creation of this record is based on the scribe's personal observations and the provider's statements to them.

## 2017-08-06 LAB — KAPPA/LAMBDA LIGHT CHAINS
Ig Kappa Free Light Chain: 39.9 mg/L — ABNORMAL HIGH (ref 3.3–19.4)
Ig Lambda Free Light Chain: 36.5 mg/L — ABNORMAL HIGH (ref 5.7–26.3)
Kappa/Lambda FluidC Ratio: 1.09 (ref 0.26–1.65)

## 2017-08-07 ENCOUNTER — Telehealth: Payer: Self-pay | Admitting: *Deleted

## 2017-08-07 NOTE — Telephone Encounter (Signed)
Received VM from pt inquiring about recent lab work.  Pt stated "I haven't been feeling well since my apt on Friday, and I have a lot going on.  I am afraid I am coming out of remission."  Lab work reviewed with [name], no evidence of disease progression.  Per [name], pt more than likely feeling bad due to recent vaccinations.  LVM for pt stating no evidence for disease progression, call back number left to discuss in more detail.

## 2017-08-08 LAB — MULTIPLE MYELOMA PANEL, SERUM
ALBUMIN SERPL ELPH-MCNC: 3.8 g/dL (ref 2.9–4.4)
ALPHA2 GLOB SERPL ELPH-MCNC: 0.9 g/dL (ref 0.4–1.0)
Albumin/Glob SerPl: 1.1 (ref 0.7–1.7)
Alpha 1: 0.2 g/dL (ref 0.0–0.4)
B-Globulin SerPl Elph-Mcnc: 1.2 g/dL (ref 0.7–1.3)
Gamma Glob SerPl Elph-Mcnc: 1.3 g/dL (ref 0.4–1.8)
Globulin, Total: 3.6 g/dL (ref 2.2–3.9)
IGM (IMMUNOGLOBIN M), SRM: 60 mg/dL (ref 26–217)
IgA, Qn, Serum: 453 mg/dL — ABNORMAL HIGH (ref 87–352)
TOTAL PROTEIN: 7.4 g/dL (ref 6.0–8.5)

## 2017-08-09 ENCOUNTER — Inpatient Hospital Stay (HOSPITAL_COMMUNITY)
Admission: AD | Admit: 2017-08-09 | Discharge: 2017-08-13 | DRG: 641 | Disposition: A | Discharge status: Home / Self Care | Payer: 59 | Source: Ambulatory Visit | Attending: Nephrology | Admitting: Nephrology

## 2017-08-09 ENCOUNTER — Encounter (HOSPITAL_COMMUNITY): Payer: Self-pay

## 2017-08-09 ENCOUNTER — Ambulatory Visit (HOSPITAL_BASED_OUTPATIENT_CLINIC_OR_DEPARTMENT_OTHER): Payer: 59 | Admitting: Hematology

## 2017-08-09 ENCOUNTER — Other Ambulatory Visit: Payer: Self-pay

## 2017-08-09 VITALS — BP 103/80 | HR 101 | Temp 98.0°F | Resp 16

## 2017-08-09 DIAGNOSIS — R450 Nervousness: Secondary | ICD-10-CM

## 2017-08-09 DIAGNOSIS — N390 Urinary tract infection, site not specified: Secondary | ICD-10-CM

## 2017-08-09 DIAGNOSIS — G2581 Restless legs syndrome: Secondary | ICD-10-CM | POA: Diagnosis present

## 2017-08-09 DIAGNOSIS — F419 Anxiety disorder, unspecified: Secondary | ICD-10-CM

## 2017-08-09 DIAGNOSIS — E871 Hypo-osmolality and hyponatremia: Secondary | ICD-10-CM

## 2017-08-09 DIAGNOSIS — R253 Fasciculation: Secondary | ICD-10-CM

## 2017-08-09 DIAGNOSIS — G4733 Obstructive sleep apnea (adult) (pediatric): Secondary | ICD-10-CM | POA: Diagnosis present

## 2017-08-09 DIAGNOSIS — E876 Hypokalemia: Secondary | ICD-10-CM | POA: Diagnosis present

## 2017-08-09 DIAGNOSIS — Z862 Personal history of diseases of the blood and blood-forming organs and certain disorders involving the immune mechanism: Secondary | ICD-10-CM

## 2017-08-09 DIAGNOSIS — D72819 Decreased white blood cell count, unspecified: Secondary | ICD-10-CM | POA: Diagnosis not present

## 2017-08-09 DIAGNOSIS — C9001 Multiple myeloma in remission: Secondary | ICD-10-CM | POA: Diagnosis not present

## 2017-08-09 DIAGNOSIS — R5383 Other fatigue: Secondary | ICD-10-CM | POA: Diagnosis not present

## 2017-08-09 DIAGNOSIS — Z90710 Acquired absence of both cervix and uterus: Secondary | ICD-10-CM

## 2017-08-09 DIAGNOSIS — R509 Fever, unspecified: Secondary | ICD-10-CM

## 2017-08-09 DIAGNOSIS — Z681 Body mass index (BMI) 19 or less, adult: Secondary | ICD-10-CM

## 2017-08-09 DIAGNOSIS — G4700 Insomnia, unspecified: Secondary | ICD-10-CM | POA: Diagnosis present

## 2017-08-09 DIAGNOSIS — E860 Dehydration: Secondary | ICD-10-CM | POA: Diagnosis present

## 2017-08-09 DIAGNOSIS — Z79899 Other long term (current) drug therapy: Secondary | ICD-10-CM

## 2017-08-09 DIAGNOSIS — Z9481 Bone marrow transplant status: Secondary | ICD-10-CM

## 2017-08-09 DIAGNOSIS — E440 Moderate protein-calorie malnutrition: Secondary | ICD-10-CM | POA: Diagnosis present

## 2017-08-09 DIAGNOSIS — K5909 Other constipation: Secondary | ICD-10-CM | POA: Diagnosis present

## 2017-08-09 DIAGNOSIS — R627 Adult failure to thrive: Secondary | ICD-10-CM | POA: Diagnosis present

## 2017-08-09 DIAGNOSIS — R109 Unspecified abdominal pain: Secondary | ICD-10-CM

## 2017-08-09 DIAGNOSIS — R6881 Early satiety: Secondary | ICD-10-CM | POA: Diagnosis present

## 2017-08-09 DIAGNOSIS — D649 Anemia, unspecified: Secondary | ICD-10-CM | POA: Diagnosis present

## 2017-08-09 DIAGNOSIS — N939 Abnormal uterine and vaginal bleeding, unspecified: Secondary | ICD-10-CM | POA: Diagnosis present

## 2017-08-09 DIAGNOSIS — R630 Anorexia: Secondary | ICD-10-CM

## 2017-08-09 DIAGNOSIS — Z85238 Personal history of other malignant neoplasm of thymus: Secondary | ICD-10-CM

## 2017-08-09 DIAGNOSIS — Z7982 Long term (current) use of aspirin: Secondary | ICD-10-CM

## 2017-08-09 DIAGNOSIS — Z7951 Long term (current) use of inhaled steroids: Secondary | ICD-10-CM

## 2017-08-09 DIAGNOSIS — Z880 Allergy status to penicillin: Secondary | ICD-10-CM

## 2017-08-09 DIAGNOSIS — Z9221 Personal history of antineoplastic chemotherapy: Secondary | ICD-10-CM

## 2017-08-09 HISTORY — DX: Adult failure to thrive: R62.7

## 2017-08-09 LAB — CBC WITH DIFFERENTIAL/PLATELET
BASOS ABS: 0 10*3/uL (ref 0.0–0.1)
BASOS PCT: 1 %
Eosinophils Absolute: 0.1 10*3/uL (ref 0.0–0.7)
Eosinophils Relative: 4 %
HEMATOCRIT: 34 % — AB (ref 36.0–46.0)
Hemoglobin: 11.9 g/dL — ABNORMAL LOW (ref 12.0–15.0)
LYMPHS PCT: 35 %
Lymphs Abs: 1.1 10*3/uL (ref 0.7–4.0)
MCH: 32 pg (ref 26.0–34.0)
MCHC: 35 g/dL (ref 30.0–36.0)
MCV: 91.4 fL (ref 78.0–100.0)
MONO ABS: 0.3 10*3/uL (ref 0.1–1.0)
Monocytes Relative: 11 %
NEUTROS ABS: 1.5 10*3/uL — AB (ref 1.7–7.7)
NEUTROS PCT: 49 %
PLATELETS: 294 10*3/uL (ref 150–400)
RBC: 3.72 MIL/uL — AB (ref 3.87–5.11)
RDW: 13.6 % (ref 11.5–15.5)
WBC: 3.1 10*3/uL — AB (ref 4.0–10.5)

## 2017-08-09 LAB — URINALYSIS, ROUTINE W REFLEX MICROSCOPIC
Bilirubin Urine: NEGATIVE
Glucose, UA: NEGATIVE mg/dL
KETONES UR: 80 mg/dL — AB
Nitrite: NEGATIVE
PROTEIN: NEGATIVE mg/dL
Specific Gravity, Urine: 1.023 (ref 1.005–1.030)
pH: 5 (ref 5.0–8.0)

## 2017-08-09 LAB — COMPREHENSIVE METABOLIC PANEL
ALBUMIN: 3.7 g/dL (ref 3.5–5.0)
ALT: 16 U/L (ref 14–54)
AST: 13 U/L — AB (ref 15–41)
Alkaline Phosphatase: 84 U/L (ref 38–126)
Anion gap: 10 (ref 5–15)
BILIRUBIN TOTAL: 1.1 mg/dL (ref 0.3–1.2)
BUN: 11 mg/dL (ref 6–20)
CALCIUM: 8.5 mg/dL — AB (ref 8.9–10.3)
CHLORIDE: 99 mmol/L — AB (ref 101–111)
CO2: 20 mmol/L — ABNORMAL LOW (ref 22–32)
CREATININE: 0.62 mg/dL (ref 0.44–1.00)
GFR calc Af Amer: >60 mL/min (ref >60)
GLUCOSE: 93 mg/dL (ref 65–99)
POTASSIUM: 3.5 mmol/L (ref 3.5–5.1)
Sodium: 129 mmol/L — ABNORMAL LOW (ref 135–145)
Total Protein: 7.1 g/dL (ref 6.5–8.1)

## 2017-08-09 LAB — MAGNESIUM: Magnesium: 1.9 mg/dL (ref 1.7–2.4)

## 2017-08-09 LAB — HEMOGLOBIN A1C
HEMOGLOBIN A1C: 5.6 % (ref 4.8–5.6)
MEAN PLASMA GLUCOSE: 114.02 mg/dL

## 2017-08-09 MED ORDER — MAGNESIUM 400 MG PO CAPS: 1.0000 | ORAL_CAPSULE | Freq: Every day | ORAL | Status: DC

## 2017-08-09 MED ORDER — ALPRAZOLAM 0.25 MG PO TABS
0.2500 mg | ORAL_TABLET | Freq: Once | ORAL | Status: AC
  Administered 2017-08-09: 0.25 mg via ORAL
  Filled 2017-08-09: qty 1

## 2017-08-09 MED ORDER — DIPHENHYDRAMINE HCL 25 MG PO CAPS
25.0000 mg | ORAL_CAPSULE | Freq: Every evening | ORAL | Status: DC | PRN
  Administered 2017-08-09 – 2017-08-12 (×2): 25 mg via ORAL
  Filled 2017-08-09 (×2): qty 1

## 2017-08-09 MED ORDER — CALCIUM CARBONATE-VITAMIN D 500-200 MG-UNIT PO TABS
1.0000 | ORAL_TABLET | Freq: Two times a day (BID) | ORAL | Status: DC
  Administered 2017-08-09 – 2017-08-13 (×8): 1 via ORAL
  Filled 2017-08-09 (×8): qty 1

## 2017-08-09 MED ORDER — LORAZEPAM 2 MG/ML IJ SOLN
0.5000 mg | Freq: Once | INTRAMUSCULAR | Status: AC
  Administered 2017-08-09: 0.5 mg via INTRAVENOUS
  Filled 2017-08-09: qty 1

## 2017-08-09 MED ORDER — CHLORHEXIDINE GLUCONATE 0.12 % MT SOLN
15.0000 mL | Freq: Two times a day (BID) | OROMUCOSAL | Status: DC
  Administered 2017-08-09 – 2017-08-13 (×8): 15 mL via OROMUCOSAL
  Filled 2017-08-09 (×8): qty 15

## 2017-08-09 MED ORDER — LORAZEPAM 0.5 MG PO TABS
0.5000 mg | ORAL_TABLET | Freq: Four times a day (QID) | ORAL | Status: DC | PRN
  Administered 2017-08-09 – 2017-08-12 (×4): 0.5 mg via ORAL
  Filled 2017-08-09 (×4): qty 1

## 2017-08-09 MED ORDER — SODIUM CHLORIDE 0.9 % IV SOLN
INTRAVENOUS | Status: DC
  Administered 2017-08-10 – 2017-08-12 (×5): via INTRAVENOUS

## 2017-08-09 MED ORDER — MAGNESIUM OXIDE 400 (241.3 MG) MG PO TABS
200.0000 mg | ORAL_TABLET | Freq: Every day | ORAL | Status: DC
  Administered 2017-08-10 – 2017-08-13 (×4): 200 mg via ORAL
  Filled 2017-08-09 (×4): qty 1

## 2017-08-09 MED ORDER — SODIUM CHLORIDE 0.9 % IV BOLUS (SEPSIS)
1000.0000 mL | Freq: Once | INTRAVENOUS | Status: AC
  Administered 2017-08-09: 1000 mL via INTRAVENOUS

## 2017-08-09 MED ORDER — FLUTICASONE PROPIONATE 50 MCG/ACT NA SUSP: 1.0000 | Freq: Every day | NASAL | Status: DC | PRN

## 2017-08-09 MED ORDER — CALCIUM CARB-CHOLECALCIFEROL 600-800 MG-UNIT PO TABS: ORAL_TABLET | Freq: Two times a day (BID) | ORAL | Status: DC

## 2017-08-09 MED ORDER — PROPRANOLOL HCL 20 MG PO TABS
10.0000 mg | ORAL_TABLET | Freq: Two times a day (BID) | ORAL | Status: DC
  Administered 2017-08-09 – 2017-08-12 (×6): 10 mg via ORAL
  Filled 2017-08-09 (×6): qty 1

## 2017-08-09 MED ORDER — ORAL CARE MOUTH RINSE
15.0000 mL | Freq: Two times a day (BID) | OROMUCOSAL | Status: DC
  Administered 2017-08-11: 15 mL via OROMUCOSAL

## 2017-08-09 MED ORDER — FAMOTIDINE IN NACL 20-0.9 MG/50ML-% IV SOLN
20.0000 mg | Freq: Two times a day (BID) | INTRAVENOUS | Status: DC
  Administered 2017-08-09 – 2017-08-11 (×4): 20 mg via INTRAVENOUS
  Filled 2017-08-09 (×5): qty 50

## 2017-08-09 MED ORDER — ASPIRIN EC 81 MG PO TBEC
81.0000 mg | DELAYED_RELEASE_TABLET | Freq: Every day | ORAL | Status: DC
  Administered 2017-08-10 – 2017-08-13 (×4): 81 mg via ORAL
  Filled 2017-08-09 (×4): qty 1

## 2017-08-09 MED ORDER — LIP MEDEX EX OINT
TOPICAL_OINTMENT | CUTANEOUS | Status: DC | PRN
  Filled 2017-08-09 (×2): qty 7

## 2017-08-09 MED ORDER — ENOXAPARIN SODIUM 40 MG/0.4ML ~~LOC~~ SOLN
40.0000 mg | SUBCUTANEOUS | Status: DC
  Administered 2017-08-09 – 2017-08-12 (×4): 40 mg via SUBCUTANEOUS
  Filled 2017-08-09 (×4): qty 0.4

## 2017-08-09 NOTE — Progress Notes (Signed)
Marland Kitchen    HEMATOLOGY/ONCOLOGY CLINIC NOTE  Date of Service: 08/09/17  Patient Care Team: [name], MD as PCP - General (Family Medicine)  CHIEF COMPLAINTS/PURPOSE OF CONSULTATION:  Follow up for multiple myeloma  DIAGNOSIS  IgA lambda R-ISS stage II multiple myeloma with innumerable lytic lesions in the calvarium, lesion in T7 vertebra and multiple lesions in the pelvis.  No significant bone pain at this time. Diagnosed in January 2017.  Current treatment  -Revlimid maintenance 13m po daily  Previous Treatment  Status post Vd x 1 cycle VRd x 6 cycles  HD Melphalan 2079mm2 on 03/22/2016 Autologous HSCT on 03/23/2016 with Dr [facility]ule [facility] 6.4 x10^6/kg)    HISTORY OF PRESENTING ILLNESS: Please see my initial consultation for details of her initial presentation.  INTERVAL HISTORY  Paula Johnson here for unscheduled followup for multiple myeloma. She reports that the Trazodone made her jitteriness and anxiety worse which has caused some significant insomnia and fatigue. Her husband also reports that she has had a significantly decreased appetite and that she become prematurely full when she does try to eat. Her husband does report that her symptoms have significantly worsened and have been present since she received her yearly vaccines at the end of October. She denies any new skin rashes. She did stop taking her Revlimid as well on Sunday, and she notes that her symptoms did not improve following this. She denies any abdominal pain, nausea, vomiting, diarrhea, constipation, or changes in bowel habits. She describes her current symptoms as like "I'm having a fever constantly", including muscle shakes, chills, and general malaise. She is afebrile while in the ED. She denies headache, neck stiffness, visual changes. She does currently have some rhinorrhea which has been residual from her recent URI. She was given Ativan by Dr SwMoreen Fowlerhowever, this only helped her for one  night and following this it did not help her further. All of her symptoms began following her yearly vaccinations at the end of October, she has never previously had issues with vaccinations in the past. She denies sinus pressure/pain, cough, CP, shortness of breath, back pain, joint pain/swelling, urinary discomfort, dysuria, or any other associated symptoms. She denies any specific weakness, abnormal jerking, neuropathy, or other focal neurological symptoms. She denies any issues with her mood.   MEDICAL HISTORY:   Past Medical History:  Diagnosis Date  . Abnormal Pap smear of vagina 03/22/2017   ASCUS pap and negative HR HPV.  Patient is on immunosuppressive medication.   . [place]   squamous cell carcinoma of thymus  . Fibroid    reason for Hysterectomy  . [place]oma ([place]  . Pain aggravated by activities of daily living    states she pulled something at right sternum in dec 2016  . PONV (postoperative nausea and vomiting)    thinks morphine caused nausea  . Prediabetes   . Urinary incontinence     SURGICAL HISTORY: Past Surgical History:  Procedure Laterality Date  . ABDOMINAL HYSTERECTOMY  1997   TAH--ovaries remain--Dr. ThNewton Pigg. THYMECTOMY  1999    SOCIAL HISTORY: Social History   Socioeconomic History  . Marital status: Married    Spouse name: Not on file  . Number of children: Not on file  . Years of education: Not on file  . Highest education level: Not on file  Social Needs  . Financial resource strain: Not on file  . Food insecurity - worry: Not on file  .  Food insecurity - inability: Not on file  . Transportation needs - medical: Not on file  . Transportation needs - non-medical: Not on file  Occupational History  . Not on file  Tobacco Use  . Smoking status: Never Smoker  . Smokeless tobacco: Never Used  Substance and Sexual Activity  . Alcohol use: No    Alcohol/week: 0.0 oz  . Drug use: No  . Sexual activity: No     Partners: Male    Birth control/protection: Surgical    Comment: TAH--ovaries remain  Other Topics Concern  . Not on file  Social History Narrative  . Not on file    FAMILY HISTORY: Family History  Problem Relation Age of Onset  . COPD Father        dec age 37/s-smoked  . Diabetes Brother   . Other Brother        committed suicide age 6  . Hyperlipidemia Mother   . Diabetes Maternal Grandmother     ALLERGIES:  is allergic to penicillins and ampicillin.  MEDICATIONS:  Current Outpatient Medications  Medication Sig Dispense Refill  . aspirin EC 81 MG tablet Take 81 mg by mouth daily.    . Bisacodyl (DULCOLAX PO) Take by mouth. 4 daily    . Calcium Carb-Cholecalciferol (CALCIUM+D3) 600-800 MG-UNIT TABS Take 2 (two) times daily by mouth.    . fluticasone (FLONASE) 50 MCG/ACT nasal spray Place into the nose.    . lenalidomide (REVLIMID) 15 MG capsule Take 1 capsule (15 mg total) by mouth daily. Auth# [id number] 21 capsule 0  . LORazepam (ATIVAN) 0.5 MG tablet Take 1 tablet (0.5 mg total) every 6 (six) hours as needed by mouth for anxiety. 30 tablet 0  . Magnesium 400 MG CAPS Take 1 tablet by mouth daily.    . Polyethylene Glycol 3350 (MIRALAX PO) Take by mouth.    . traZODone (DESYREL) 50 MG tablet Take 1-2 tablets (50-100 mg total) at bedtime as needed by mouth for sleep. 60 tablet 1   No current facility-administered medications for this visit.     REVIEW OF SYSTEMS:    10 Point review of Systems was done is negative except as noted above.  PHYSICAL EXAMINATION: ECOG PERFORMANCE STATUS: 1 - Symptomatic but completely ambulatory  .VSS as per EPIC GENERAL:alert, in no acute distress and comfortable SKIN: skin color, texture, turgor are normal, no rashes or significant lesions EYES: normal, conjunctiva are pink and non-injected, sclera clear OROPHARYNX:no exudate, no erythema and lips, buccal mucosa, and tongue normal  NECK: supple, no JVD, thyroid normal size, non-tender,  without nodularity LYMPH:  no palpable lymphadenopathy in the cervical, axillary or inguinal LUNGS: clear to auscultation with normal respiratory effort HEART: regular rate & rhythm,  no murmurs and no lower extremity edema ABDOMEN: abdomen soft, non-tender, normoactive bowel sounds  Musculoskeletal: no cyanosis of digits and no clubbing  PSYCH: alert & oriented x 3 with fluent speech NEURO: no focal motor/sensory deficits  LABORATORY DATA:  I have reviewed the data as listed . CBC Latest Ref Rng & Units 08/03/2017 05/25/2017 03/19/2017  WBC 3.9 - 10.3 10e3/uL 3.7(L) 2.6(L) 3.4(L)  Hemoglobin 11.6 - 15.9 g/dL 13.4 12.7 13.2  Hematocrit 34.8 - 46.6 % 40.5 38.4 40.0  Platelets 145 - 400 10e3/uL 233 214 229   ANC 2.2k . CBC    Component Value Date/Time   WBC 3.7 (L) 08/03/2017 0815   WBC 2.3 (L) 01/28/2016 0720   RBC 4.25 08/03/2017 0815   RBC  3.79 (L) 01/28/2016 0720   HGB 13.4 08/03/2017 0815   HCT 40.5 08/03/2017 0815   PLT 233 08/03/2017 0815   MCV 95.3 08/03/2017 0815   MCH 31.5 08/03/2017 0815   MCH 30.6 01/28/2016 0720   MCHC 33.1 08/03/2017 0815   MCHC 32.2 01/28/2016 0720   RDW 14.4 08/03/2017 0815   LYMPHSABS 1.0 08/03/2017 0815   MONOABS 0.4 08/03/2017 0815   EOSABS 0.3 08/03/2017 0815   BASOSABS 0.1 08/03/2017 0815     . CMP Latest Ref Rng & Units 08/03/2017 08/03/2017 05/25/2017  Glucose 70 - 140 mg/dl 156(H) - 73  BUN 7.0 - 26.0 mg/dL 13.5 - 13.1  Creatinine 0.6 - 1.1 mg/dL 0.8 - 0.8  Sodium 136 - 145 mEq/L 137 - 139  Potassium 3.5 - 5.1 mEq/L 3.4(L) - 4.1  Chloride 101 - 111 mmol/L - - -  CO2 22 - 29 mEq/L 23 - 26  Calcium 8.4 - 10.4 mg/dL 9.2 - 9.4  Total Protein 6.0 - 8.5 g/dL 8.0 7.4 7.2  Total Bilirubin 0.20 - 1.20 mg/dL 1.10 - 1.04  Alkaline Phos 40 - 150 U/L 126 - 76  AST 5 - 34 U/L 15 - 16  ALT 0 - 55 U/L 40 - 19   Component     Latest Ref Rng & Units 08/09/2017  WBC     4.0 - 10.5 K/uL 3.1 (L)  RBC     3.87 - 5.11 MIL/uL 3.72 (L)    Hemoglobin     12.0 - 15.0 g/dL 11.9 (L)  HCT     36.0 - 46.0 % 34.0 (L)  MCV     78.0 - 100.0 fL 91.4  MCH     26.0 - 34.0 pg 32.0  MCHC     30.0 - 36.0 g/dL 35.0  RDW     11.5 - 15.5 % 13.6  Platelets     150 - 400 K/uL 294  Neutrophils     % 49  NEUT#     1.7 - 7.7 K/uL 1.5 (L)  Lymphocytes     % 35  Lymphocyte #     0.7 - 4.0 K/uL 1.1  Monocytes Relative     % 11  Monocyte #     0.1 - 1.0 K/uL 0.3  Eosinophil     % 4  Eosinophils Absolute     0.0 - 0.7 K/uL 0.1  Basophil     % 1  Basophils Absolute     0.0 - 0.1 K/uL 0.0  Sodium     135 - 145 mmol/L 129 (L)  Potassium     3.5 - 5.1 mmol/L 3.5  Chloride     101 - 111 mmol/L 99 (L)  CO2     22 - 32 mmol/L 20 (L)  Glucose     65 - 99 mg/dL 93  BUN     6 - 20 mg/dL 11  Creatinine     0.44 - 1.00 mg/dL 0.62  Calcium     8.9 - 10.3 mg/dL 8.5 (L)  Total Protein     6.5 - 8.1 g/dL 7.1  Albumin     3.5 - 5.0 g/dL 3.7  AST     15 - 41 U/L 13 (L)  ALT     14 - 54 U/L 16  Alkaline Phosphatase     38 - 126 U/L 84  Total Bilirubin     0.3 - 1.2 mg/dL 1.1  GFR, Est  Non African American     >60 mL/min >60  GFR, Est African American     >60 mL/min >60  Anion gap     5 - 15 10  Magnesium     1.7 - 2.4 mg/dL 1.9       RADIOGRAPHIC STUDIES: I have personally reviewed the radiological images as listed and agreed with the findings in the report. No results found.  ASSESSMENT & PLAN:   61 year old Caucasian female with  1) R-ISS Stage II IgA lambda multiple myeloma with standard risk cytogenetics t (11;14) currently in remission.  ONCOLOGIC HISTORY On diagnosis PET/CT showed evidence of lytic bone lesions in the calvarium, iliac bones  and T7. No hypercalcemia or renal failure at this time. Pretreatment M spike of 5 in 2 monoclonal bands. BM bx with 69% plasma cells with Lambda light chain restriction Patient is status post cycle 1 of Vd and 5 cycles of VRd and has she tolerated well other than  some grade 1 rash. Also noted to have fevers from Velcade which are controlled with naproxen.  S/p 6 cycles of VRd- VGPR pre-transplant [facility] 268[facility]  Myeloma panel 08/03/2017 -- no M protein and IFE -polyclonal gammopathy and nl K/L SFC ratio. #2 anemia due to multiple myeloma and treatment medications- resolved #3 fatigue due to Revlimid with mild leukopenia -- Revlimid has been changed to 3weeks on 1 week off. #4 h/o multiple bone metastases from multiple myeloma - on zometa q293monthper [facility] transplant team. #5 Intermittent Muscle cramps - could be from Revlimid/dehydration.-resolved. Plan -Patient has no evidence of myeloma recurrence/progression based on current labs -given her worsening FTT, Jitteriness, fever and insomnia and worsening fatigue we will continue to hold Revlimid and admit her to the hospital for further evaluation and and management. --continue ASA daily for VTE prophylaxis while on Revlimid -zometa q2m22monthContinue acyclovir VZV prophylaxis for 1 year post-transplant for an additional couple of months. -K is slightly borderline today at 3.4 (08/03/17). If this lowers any further then we will consider PO replacement. I also encouraged her to begin increasing her K intake at home in her diet.   #6 Insomnia, jittery/unease  ? RLS vs post vaccinal neurological symptoms vs Revlimid vs anxiety . R/o infection. #7 Poor po intake and FTT Plan -admit patient to the hospital Infectious workup -neurology evaluation and EMG/NCS if indicated. -viral workup -dietician/therapies to evaluate -IVF and nutritional support. -We will keep her off of Revlimid for now to hopefully help with this.  -We will send off muscle inflammatory markers, viral panel, infectious workup.    I spent 20 minutes counseling the patient face to face. The total time spent in the appointment was 25 minutes and more than 50% was on counseling and direct patient  cares.  .I have reviewed the above documentation for accuracy and completeness, and I agree with the above.  GauSullivan Lone MS SaltilloHIVMS SCHSpect[facility] Transplant Centermatology/Oncology Physician ConSilver SpringsOffice):       [phone] cell):  [phone]):           [phone] document serves as a record of services personally performed by GauSullivan LoneD. It was created on his behalf by WilReola Mosher trained medical scribe. The creation of this record is based on the scribe's personal observations and the provider's statements to them.   .I have reviewed the above documentation for accuracy and completeness, and  I agree with the above. Brunetta Genera MD

## 2017-08-09 NOTE — Patient Instructions (Signed)
Thank you for choosing [facility] to provide your oncology and hematology care.  To afford each patient quality time with our providers, please arrive 30 minutes before your scheduled appointment time.  If you arrive late for your appointment, you may be asked to reschedule.  We strive to give you quality time with our providers, and arriving late affects you and other patients whose appointments are after yours.   If you are a no show for multiple scheduled visits, you may be dismissed from the clinic at the providers discretion.    Again, thank you for choosing [facility], our hope is that these requests will decrease the amount of time that you wait before being seen by our physicians.  ______________________________________________________________________  Should you have questions after your visit to the [facility], please contact our office at [phone] between the hours of 8:30 and 4:30 p.m.    Voicemails left after 4:30p.m will not be returned until the following business day.    For prescription refill requests, please have your pharmacy contact us directly.  Please also try to allow 48 hours for prescription requests.    Please contact the scheduling department for questions regarding scheduling.  For scheduling of procedures such as PET scans, CT scans, MRI, Ultrasound, etc please contact central scheduling at [phone].    Resources For Cancer Patients and Caregivers:   Oncolink.org:  A wonderful resource for patients and healthcare providers for information regarding your disease, ways to tract your treatment, what to expect, etc.     American Cancer Society:  800-227-2345  Can help patients locate various types of support and financial assistance  Cancer Care: 1-800-813-HOPE (4673) Provides financial assistance, online support groups, medication/co-pay assistance.    [place] DSS:  336-641-3447 Where to apply for food  stamps, Medicaid, and utility assistance  Medicare Rights Center: 800-333-4114 Helps people with Medicare understand their rights and benefits, navigate the Medicare system, and secure the quality healthcare they deserve  SCAT: 336-333-6589 [place] [employer]'s shared-ride transportation service for eligible riders who have a disability that prevents them from riding the fixed route bus.    For additional information on assistance programs please contact our social worker:   Grier Hock/Abigail Elmore:  [phone]            

## 2017-08-09 NOTE — H&P (Signed)
History and Physical  YSIDRA SOPHER VBT:[id number] DOB: 07/27/1956 DOA: 08/09/2017  Referring physician: EDP PCP: [name], MD   Chief Complaint: not feeling well  HPI: Paula Johnson is a 61 y.o. 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 she reports since after the vaccination, she started to feel sick, feverish, jittery, upset stomach, decreased oral intake. she denies abdominal pain, no n/v, no diarrhea, no dysuria. She reports some mild URI symptom with nasal congestion, post nasal drip, but denies cough, no sob, no sore throat, no chest pain. No rash. She does has chronic constipation , h/o vaginal vault prolapse after hysterectomy, reports vaginal spotting with pessary, she use incontinence ring with support pessary and  topical estrogen cream.  h/o insomnia/osa on mouth piece at night.   Today she was evaluated at oncology clinic and sent for direct admission due to concern of dehydration, occult infection and the possibility of serum sickness from recent vaccinations.     Review of Systems:  Detail per HPI, Review of systems are otherwise negative  Past Medical History:  Diagnosis Date  . Abnormal Pap smear of vagina 03/22/2017   ASCUS pap and negative HR HPV.  Patient is on immunosuppressive medication.   . [place] [place]   squamous cell carcinoma of thymus  . Fibroid    reason for Hysterectomy  . [place] [place] [place]  . Pain aggravated by activities of daily living    states she pulled something at right sternum in dec 2016  . PONV (postoperative nausea and vomiting)    thinks morphine caused nausea  . Prediabetes   . Urinary incontinence    Past Surgical History:  Procedure Laterality Date  . ABDOMINAL HYSTERECTOMY  1997   TAH--ovaries remain--Dr. TNewton Pigg . THYMECTOMY  1999   Social History:  reports that  has never smoked. she has never used smokeless tobacco. She reports that she does not drink alcohol or use drugs. Patient lives at home & is able to participate in activities of daily living independently   Allergies  Allergen Reactions  . Penicillins     Other reaction(s): Unknown  . Ampicillin Rash    Family History  Problem Relation Age of Onset  . COPD Father        dec age 45/s-smoked  . Diabetes Brother   . Other Brother        committed suicide age 63 . Hyperlipidemia Mother   . Diabetes Maternal Grandmother       Prior to Admission medications   Medication Sig Start Date End Date Taking? Authorizing Provider  aspirin EC 81 MG tablet Take 81 mg by mouth daily.    KBrunetta Genera MD  Bisacodyl (DULCOLAX PO) Take by mouth. 4 daily    [provider]  Calcium Carb-Cholecalciferol (CALCIUM+D3) 600-800 MG-UNIT TABS Take 2 (two) times daily by mouth.    [provider]  fluticasone (FLONASE) 50 MCG/ACT nasal spray Place into the nose. 01/17/17 01/17/18  [provider]  lenalidomide (REVLIMID) 15 MG capsule Take 1 capsule (15 mg total) by mouth daily. Auth# [id number]   KBrunetta Genera MD  LORazepam (ATIVAN) 0.5 MG tablet Take 1 tablet (0.5 mg total) every 6 (six) hours as needed by mouth for anxiety.  07/30/17   Brunetta Genera, MD  Magnesium 400 MG CAPS Take 1 tablet by mouth daily.    [provider]  Polyethylene Glycol 3350 (MIRALAX PO) Take by mouth.    [provider]  traZODone (DESYREL) 50 MG tablet Take 1-2 tablets (50-100 mg total) at bedtime as needed by mouth for sleep. 08/03/17   Brunetta Genera, MD    Physical Exam: BP (!) 148/85 (BP Location: Right Arm)   Pulse 88   Temp 98.3 F (36.8 C) (Oral)   Resp 18   Ht 5' 8"  (1.727 m)   Wt 58.9 kg (129 lb 13.6 oz)   SpO2 98%   BMI 19.74 kg/m   General:   Frail, tearing, NAD, thin Eyes: PERRL ENT: unremarkable Neck: supple, no JVD Cardiovascular: RRR Respiratory: CTABL Abdomen: soft/NT/ND, positive bowel sounds Skin: no rash Musculoskeletal:  No edema Psychiatric: calm/cooperative, labile mood, anxious Neurologic: no focal findings            Labs on Admission:  Basic Metabolic Panel: Recent Labs  Lab 08/03/17 0815  NA 137  K 3.4*  CO2 23  GLUCOSE 156*  BUN 13.5  CREATININE 0.8  CALCIUM 9.2   Liver Function Tests: Recent Labs  Lab 08/03/17 0815  AST 15  ALT 40  ALKPHOS 126  BILITOT 1.10  PROT 8.0  7.4  ALBUMIN 3.9   No results for input(s): LIPASE, AMYLASE in the last 168 hours. No results for input(s): AMMONIA in the last 168 hours. CBC: Recent Labs  Lab 08/03/17 0815  WBC 3.7*  NEUTROABS 2.0  HGB 13.4  HCT 40.5  MCV 95.3  PLT 233   Cardiac Enzymes: No results for input(s): CKTOTAL, CKMB, CKMBINDEX, TROPONINI in the last 168 hours.  BNP (last 3 results) No results for input(s): BNP in the last 8760 hours.  ProBNP (last 3 results) No results for input(s): PROBNP in the last 8760 hours.  CBG: No results for input(s): GLUCAP in the last 168 hours.  Radiological Exams on Admission: No results found.    Assessment/Plan Present on Admission: **None**  Hyponatremia:   She look dehydrated, she is mildly orthostatic on presentation. Stat labs obtained showed hyponatremia, sodium 129.  Start hydration, repeat labs in am  Upset stomach, no abdominal pain, no n/v,  Started on pepcid  ua pending  she reports h/o constipation, on stool regimen to prevent constipation.  Mild leukopenia seems to be chronic since 12/2016 , no fever. ua pending, will hold off abx, hold off steroids until rule out infection.   IgA lambda MM s/p autologus bone marrow transplant in 02/2016 She is currently on revlimid off week  Reports in remission Oncology consulted  Insomnia, she declined [place], she reports  trazadone and ativan has not helped, she agreed to try benadryl prn She does has underline sleep apnea for which she is wearing a mouth piece.    DVT prophylaxis: lovenox  Consultants:  Oncology   Code Status: full   Family Communication:  Patient   Disposition Plan: med tele observation  Time spent: 75mns  FFlorencia ReasonsMD, PhD [facility] Pager [phone] 7PM-7AM, please contact night-coverage at [email or web address], password [email or web address]

## 2017-08-10 ENCOUNTER — Observation Stay (HOSPITAL_COMMUNITY): Payer: 59

## 2017-08-10 ENCOUNTER — Ambulatory Visit: Payer: 59

## 2017-08-10 ENCOUNTER — Other Ambulatory Visit: Payer: 59

## 2017-08-10 ENCOUNTER — Ambulatory Visit: Payer: 59 | Admitting: Hematology

## 2017-08-10 DIAGNOSIS — E871 Hypo-osmolality and hyponatremia: Secondary | ICD-10-CM | POA: Diagnosis not present

## 2017-08-10 DIAGNOSIS — D649 Anemia, unspecified: Secondary | ICD-10-CM

## 2017-08-10 DIAGNOSIS — C9001 Multiple myeloma in remission: Secondary | ICD-10-CM | POA: Diagnosis not present

## 2017-08-10 DIAGNOSIS — R253 Fasciculation: Secondary | ICD-10-CM | POA: Diagnosis not present

## 2017-08-10 DIAGNOSIS — R627 Adult failure to thrive: Secondary | ICD-10-CM | POA: Diagnosis not present

## 2017-08-10 DIAGNOSIS — R5383 Other fatigue: Secondary | ICD-10-CM

## 2017-08-10 DIAGNOSIS — N390 Urinary tract infection, site not specified: Secondary | ICD-10-CM | POA: Diagnosis not present

## 2017-08-10 DIAGNOSIS — G2571 Drug induced akathisia: Secondary | ICD-10-CM | POA: Diagnosis not present

## 2017-08-10 DIAGNOSIS — R109 Unspecified abdominal pain: Secondary | ICD-10-CM

## 2017-08-10 LAB — BASIC METABOLIC PANEL
Anion gap: 6 (ref 5–15)
BUN: 9 mg/dL (ref 6–20)
CHLORIDE: 104 mmol/L (ref 101–111)
CO2: 22 mmol/L (ref 22–32)
Calcium: 8.2 mg/dL — ABNORMAL LOW (ref 8.9–10.3)
Creatinine, Ser: 0.6 mg/dL (ref 0.44–1.00)
Glucose, Bld: 102 mg/dL — ABNORMAL HIGH (ref 65–99)
POTASSIUM: 3.5 mmol/L (ref 3.5–5.1)
SODIUM: 132 mmol/L — AB (ref 135–145)

## 2017-08-10 LAB — CBC WITH DIFFERENTIAL/PLATELET
BASOS ABS: 0 10*3/uL (ref 0.0–0.1)
BASOS PCT: 1 %
EOS ABS: 0.1 10*3/uL (ref 0.0–0.7)
EOS PCT: 3 %
HCT: 31.9 % — ABNORMAL LOW (ref 36.0–46.0)
HEMOGLOBIN: 11 g/dL — AB (ref 12.0–15.0)
Lymphocytes Relative: 34 %
Lymphs Abs: 1.1 10*3/uL (ref 0.7–4.0)
MCH: 31.7 pg (ref 26.0–34.0)
MCHC: 34.5 g/dL (ref 30.0–36.0)
MCV: 91.9 fL (ref 78.0–100.0)
Monocytes Absolute: 0.3 10*3/uL (ref 0.1–1.0)
Monocytes Relative: 10 %
NEUTROS PCT: 52 %
Neutro Abs: 1.7 10*3/uL (ref 1.7–7.7)
PLATELETS: 279 10*3/uL (ref 150–400)
RBC: 3.47 MIL/uL — AB (ref 3.87–5.11)
RDW: 13.7 % (ref 11.5–15.5)
WBC: 3.3 10*3/uL — AB (ref 4.0–10.5)

## 2017-08-10 LAB — TSH: TSH: 3.2 u[IU]/mL (ref 0.350–4.500)

## 2017-08-10 LAB — CK: CK TOTAL: 100 U/L (ref 38–234)

## 2017-08-10 MED ORDER — IOPAMIDOL (ISOVUE-300) INJECTION 61%
INTRAVENOUS | Status: AC
  Administered 2017-08-10: 100 mL
  Filled 2017-08-10: qty 100

## 2017-08-10 MED ORDER — IOPAMIDOL (ISOVUE-300) INJECTION 61%
INTRAVENOUS | Status: AC
  Administered 2017-08-10: 15:00:00
  Filled 2017-08-10: qty 30

## 2017-08-10 MED ORDER — LEVOFLOXACIN 250 MG PO TABS
250.0000 mg | ORAL_TABLET | Freq: Every day | ORAL | Status: DC
  Administered 2017-08-10 – 2017-08-12 (×3): 250 mg via ORAL
  Filled 2017-08-10 (×3): qty 1

## 2017-08-10 MED ORDER — DEXAMETHASONE 4 MG PO TABS
4.0000 mg | ORAL_TABLET | Freq: Every day | ORAL | Status: DC
  Administered 2017-08-10 – 2017-08-13 (×4): 4 mg via ORAL
  Filled 2017-08-10 (×4): qty 1

## 2017-08-10 MED ORDER — GABAPENTIN 300 MG PO CAPS
300.0000 mg | ORAL_CAPSULE | Freq: Every day | ORAL | Status: DC
  Administered 2017-08-10 – 2017-08-11 (×2): 300 mg via ORAL
  Filled 2017-08-10 (×2): qty 1

## 2017-08-10 MED ORDER — LORAZEPAM 2 MG/ML IJ SOLN
0.5000 mg | Freq: Once | INTRAMUSCULAR | Status: AC
  Administered 2017-08-10: 0.5 mg via INTRAVENOUS
  Filled 2017-08-10: qty 1

## 2017-08-10 MED ORDER — ENSURE ENLIVE PO LIQD
237.0000 mL | Freq: Three times a day (TID) | ORAL | Status: DC
  Administered 2017-08-10 – 2017-08-13 (×5): 237 mL via ORAL

## 2017-08-10 MED ORDER — POTASSIUM CHLORIDE 20 MEQ/15ML (10%) PO SOLN
40.0000 meq | Freq: Once | ORAL | Status: AC
Start: 2017-08-10 — End: 2017-08-10
  Administered 2017-08-10: 40 meq via ORAL
  Filled 2017-08-10: qty 30

## 2017-08-10 MED ORDER — IOPAMIDOL (ISOVUE-300) INJECTION 61%
15.0000 mL | Freq: Once | INTRAVENOUS | Status: DC | PRN
Start: 2017-08-10 — End: 2017-08-13

## 2017-08-10 MED ORDER — B COMPLEX-C PO TABS
1.0000 | ORAL_TABLET | Freq: Every day | ORAL | Status: DC
  Administered 2017-08-10 – 2017-08-13 (×4): 1 via ORAL
  Filled 2017-08-10 (×4): qty 1

## 2017-08-10 NOTE — Progress Notes (Addendum)
PROGRESS NOTE  Paula Johnson CZY:[id number] DOB: [birth year] DOA: 08/09/2017 PCP: [name], MD  Brief summary:  Direct admission from oncology office due to fatigue, jittery, dehydration.   HPI/Recap of past 24 hours:  Reports not feeling better, remain feeling jittery Husband at bedside wearing a mask, he reports  caught a cold from the patient  Assessment/Plan: Active Problems:   FTT (failure to thrive) in adult  Hyponatremia:   She look dehydrated, she is mildly orthostatic on presentation. Stat labs obtained showed hyponatremia, sodium 129.  Start hydration, repeat labs in am sodium increased to 132, continue hydration  Upset stomach, no abdominal pain, no n/v,  Started on pepcid  ua + ketone, small leuk, rare bacteria , urine culture pending, oncology started levaquin for possible uti  she reports h/o constipation, on stool regimen to prevent constipation.  CT ab /pel ordered by oncology  Fatigue, jittery,  Tele unremarkable, cxr no acute findings Propranolol started for symptom control oncology observed muscle fasiculation:  Ck wnl, oncology recommended emg/nerve conduction testing and neurology consultation Case discussed with neurology Dr Leonel Ramsay who will see patient in consult. Am cortisol level pending  Oncology also testing for possible post viral fatigue syndrome  Mild leukopenia seems to be chronic since 12/2016 , no fever. ua pending, will hold off abx, hold off steroids until rule out infection.   IgA lambda MM s/p autologus bone marrow transplant in 02/2016 She is currently on revlimid off week  Reports in remission Oncology consulted  Insomnia, she declined [place], she reports trazadone and ativan has not helped, she agreed to try benadryl prn She does has underline sleep apnea for which she is wearing a mouth piece.   Anxiety; on prn ativan   DVT prophylaxis: lovenox     Code Status: full  Family Communication: patient and  husband at bedside   Disposition Plan: need oncology clearance for discharge   Consultants:  Oncology  Dr Irene Limbo ( direct admission from oncology office)  neurology  Procedures:  none  Antibiotics:  levaquin from 11/16   Objective: BP (!) 142/80 (BP Location: Right Arm)   Pulse 81   Temp 98.5 F (36.9 C) (Oral)   Resp 14   Ht 5\' 8"  (1.727 m)   Wt 58.9 kg (129 lb 13.6 oz)   SpO2 100%   BMI 19.74 kg/m   Intake/Output Summary (Last 24 hours) at 08/10/2017 1534 Last data filed at 08/10/2017 1524 Gross per 24 hour  Intake 1060 ml  Output -  Net 1060 ml   Filed Weights   08/09/17 1537  Weight: 58.9 kg (129 lb 13.6 oz)    Exam: Patient is examined daily including today on 08/10/2017, exams remain the same as of yesterday except that has changed    General:  NAD  Cardiovascular: RRR  Respiratory: CTABL  Abdomen: Soft/ND/NT, positive BS  Musculoskeletal: No Edema  Neuro: alert, oriented   Data Reviewed: Basic Metabolic Panel: Recent Labs  Lab 08/09/17 1720 08/10/17 0527  NA 129* 132*  K 3.5 3.5  CL 99* 104  CO2 20* 22  GLUCOSE 93 102*  BUN 11 9  CREATININE 0.62 0.60  CALCIUM 8.5* 8.2*  MG 1.9  --    Liver Function Tests: Recent Labs  Lab 08/09/17 1720  AST 13*  ALT 16  ALKPHOS 84  BILITOT 1.1  PROT 7.1  ALBUMIN 3.7   No results for input(s): LIPASE, AMYLASE in the last 168 hours. No results for input(s): AMMONIA  in the last 168 hours. CBC: Recent Labs  Lab 08/09/17 1720 08/10/17 0527  WBC 3.1* 3.3*  NEUTROABS 1.5* 1.7  HGB 11.9* 11.0*  HCT 34.0* 31.9*  MCV 91.4 91.9  PLT 294 279   Cardiac Enzymes:   Recent Labs  Lab 08/10/17 0527  CKTOTAL 100   BNP (last 3 results) No results for input(s): BNP in the last 8760 hours.  ProBNP (last 3 results) No results for input(s): PROBNP in the last 8760 hours.  CBG: No results for input(s): GLUCAP in the last 168 hours.  No results found for this or any previous visit (from  the past 240 hour(s)).   Studies: Dg Chest 2 View  Result Date: 08/10/2017 CLINICAL DATA:  Weakness, fever EXAM: CHEST  2 VIEW COMPARISON:  09/29/2015 FINDINGS: Prior median sternotomy. Heart is normal size. Lungs are clear. No effusions. No acute bony abnormality. IMPRESSION: No active cardiopulmonary disease. Electronically Signed   By: Rolm Baptise M.D.   On: 08/10/2017 11:00    Scheduled Meds: . iopamidol      . aspirin EC  81 mg Oral Daily  . B-complex with vitamin C  1 tablet Oral Daily  . calcium-vitamin D  1 tablet Oral BID  . chlorhexidine  15 mL Mouth Rinse BID  . dexamethasone  4 mg Oral Q breakfast  . enoxaparin (LOVENOX) injection  40 mg Subcutaneous Q24H  . feeding supplement (ENSURE ENLIVE)  237 mL Oral TID BM  . levofloxacin  250 mg Oral Daily  . LORazepam  0.5 mg Intravenous Once  . magnesium oxide  200 mg Oral Daily  . mouth rinse  15 mL Mouth Rinse q12n4p  . propranolol  10 mg Oral BID    Continuous Infusions: . sodium chloride 100 mL/hr at 08/10/17 0700  . famotidine (PEPCID) IV Stopped (08/10/17 1019)     Time spent: 35 mins I have personally reviewed and interpreted on  08/10/2017 daily labs, tele strips, imagings as discussed above under date review session and assessment and plans.  I reviewed all nursing notes, pharmacy notes, consultant notes,  vitals, pertinent old records  I have discussed plan of care as described above with RN , patient and family on 08/10/2017   Florencia Reasons MD, PhD  [facility] Pager [phone]. If 7PM-7AM, please contact night-coverage at [email or web address], password [email or web address] 08/10/2017, 3:34 PM  LOS: 1 day

## 2017-08-10 NOTE — Care Management Note (Signed)
Case Management Note  Patient Details  Name: JAMILE REKOWSKI MRN: [id number] Date of Birth: 1956-09-12  Subjective/Objective:                  evaluated at oncology clinic and sent for direct admission due to concern of dehydration, occult infection and the possibility of serum sickness from recent vaccinations.     Action/Plan: Date: August 10, 2017 Velva Harman, BSN, [place], [place] Chart and notes review for patient progress and needs. Will follow for case management and discharge needs. Next review date: [id number] Expected Discharge Date:  (unknown)               Expected Discharge Plan:  Home/Self Care  In-House Referral:     Discharge planning Services  CM Consult  Post Acute Care Choice:    Choice offered to:     DME Arranged:    DME Agency:     HH Arranged:    HH Agency:     Status of Service:  In process, will continue to follow  If discussed at Long Length of Stay Meetings, dates discussed:    Additional Comments:  Leeroy Cha, RN 08/10/2017, 8:02 AM

## 2017-08-10 NOTE — Progress Notes (Signed)
HEMATOLOGY/ONCOLOGY INPATIENT PROGRESS NOTE  Date of Service: 08/10/2017  Inpatient Attending: .Florencia Reasons, MD  SUBJECTIVE  Paula Johnson reports that she continues to feeling depleted, fatigued and still feels jittery. She reports that there hasn't been much improvement in her condition and how she feels overall, but she states that she was able to rest last night with the Ativan she was given. She reports that her jitteriness and persistent fatiuge is still present and this is still causing her anxiety. She continues to only eat in very little amounts. She does not currently want lunch and she states that this morning during her breakfast that she again had the premature satiation sensation which caused her to eat very little. She denies any associated depression with her current symptoms. Her UA showed some mild evidence of UTI. She denies any urinary type symptoms, no dysuria, hematuria, frequency, urgency. Muscle inflammatory markers have returned back as normal.  EKG mostly normal, but with old signs of RBBB. She denies any issues with swallowing.  Notes some chronic issues with vaginal bleeding related to prolapse but has somewhat improved with estrace cream for atrophic changes. She notes some poorly localized abdominal pain. No overt evidence of GI bleeding. Hgb is somewhat lower.  On review of systems, pt denies fever, chills, rash, mouth sores, weight loss, urinary complaints. Denies pain. Pt denies abdominal pain, nausea, vomiting. She denies any sore throat. Notable for that as in the above HPI. Additionally, she reports that she removed her pessary. She states that with bowel movements she will experience some vaginal bleeding, but she was placed on Estrace creme which has helped with this.     OBJECTIVE:  Flat affect and appears significantly fatigued.  PHYSICAL EXAMINATION: . Vitals:   08/09/17 1537 08/09/17 2014 08/10/17 0517 08/10/17 1333  BP: (!) 148/85 (!) 146/83 (!) 152/94  (!) 142/80  Pulse: 88 90 71 81  Resp: 18 16 20 14   Temp: 98.3 F (36.8 C) 99.6 F (37.6 C) 98.7 F (37.1 C) 98.5 F (36.9 C)  TempSrc: Oral Oral Oral Oral  SpO2: 98% 95% 98% 100%  Weight: 129 lb 13.6 oz (58.9 kg)     Height: 5' 8"  (1.727 m)      Filed Weights   08/09/17 1537  Weight: 129 lb 13.6 oz (58.9 kg)   .Body mass index is 19.74 kg/m.  GENERAL:alert, in no acute distress and comfortable SKIN: skin color, texture, turgor are normal, no rashes or significant lesions EYES: normal, conjunctiva are pink and non-injected, sclera clear OROPHARYNX:no exudate, no erythema and lips, buccal mucosa, and tongue normal  NECK: supple, no JVD, thyroid normal size, non-tender, without nodularity LYMPH:  no palpable lymphadenopathy in the cervical, axillary or inguinal LUNGS: clear to auscultation with normal respiratory effort HEART: regular rate & rhythm,  no murmurs and no lower extremity edema ABDOMEN: abdomen soft, non-tender, normoactive bowel sounds  Musculoskeletal: no cyanosis of digits and no clubbing  PSYCH: alert & oriented x 3 with fluent speech NEURO: no focal motor/sensory deficits  MEDICAL HISTORY:   Past Medical History:  Diagnosis Date  . Abnormal Pap smear of vagina 03/22/2017   ASCUS pap and negative HR HPV.  Patient is on immunosuppressive medication.   . Cancer ([place]) 1999   squamous cell carcinoma of thymus  . Fibroid    reason for Hysterectomy  . Multiple myeloma ([employer]) 08/2015  . Pain aggravated by activities of daily living    states she pulled something at right sternum  in dec 2016  . PONV (postoperative nausea and vomiting)    thinks morphine caused nausea  . Prediabetes   . Urinary incontinence     SURGICAL HISTORY: Past Surgical History:  Procedure Laterality Date  . ABDOMINAL HYSTERECTOMY  1997   TAH--ovaries remain--Dr. Newton Pigg  . THYMECTOMY  1999    SOCIAL HISTORY: Social History   Socioeconomic History  . Marital status:  Married    Spouse name: Not on file  . Number of children: Not on file  . Years of education: Not on file  . Highest education level: Not on file  Social Needs  . Financial resource strain: Not on file  . Food insecurity - worry: Not on file  . Food insecurity - inability: Not on file  . Transportation needs - medical: Not on file  . Transportation needs - non-medical: Not on file  Occupational History  . Not on file  Tobacco Use  . Smoking status: Never Smoker  . Smokeless tobacco: Never Used  Substance and Sexual Activity  . Alcohol use: No    Alcohol/week: 0.0 oz  . Drug use: No  . Sexual activity: No    Partners: Male    Birth control/protection: Surgical    Comment: TAH--ovaries remain  Other Topics Concern  . Not on file  Social History Narrative  . Not on file    FAMILY HISTORY: Family History  Problem Relation Age of Onset  . COPD Father        dec age 37/s-smoked  . Diabetes Brother   . Other Brother        committed suicide age 65  . Hyperlipidemia Mother   . Diabetes Maternal Grandmother     ALLERGIES:  is allergic to penicillins and ampicillin.  MEDICATIONS:  Scheduled Meds: . aspirin EC  81 mg Oral Daily  . calcium-vitamin D  1 tablet Oral BID  . chlorhexidine  15 mL Mouth Rinse BID  . enoxaparin (LOVENOX) injection  40 mg Subcutaneous Q24H  . levofloxacin  250 mg Oral Daily  . magnesium oxide  200 mg Oral Daily  . mouth rinse  15 mL Mouth Rinse q12n4p  . propranolol  10 mg Oral BID   Continuous Infusions: . sodium chloride    . famotidine (PEPCID) IV Stopped (08/10/17 1019)   PRN Meds:.diphenhydrAMINE, fluticasone, lip balm, LORazepam  REVIEW OF SYSTEMS:    10 Point review of Systems was done is negative except as noted above.   LABORATORY DATA:  I have reviewed the data as listed  . CBC Latest Ref Rng & Units 08/10/2017 08/09/2017 08/03/2017  WBC 4.0 - 10.5 K/uL 3.3(L) 3.1(L) 3.7(L)  Hemoglobin 12.0 - 15.0 g/dL 11.0(L) 11.9(L) 13.4   Hematocrit 36.0 - 46.0 % 31.9(L) 34.0(L) 40.5  Platelets 150 - 400 K/uL 279 294 233    . CMP Latest Ref Rng & Units 08/10/2017 08/09/2017 08/03/2017  Glucose 65 - 99 mg/dL 102(H) 93 156(H)  BUN 6 - 20 mg/dL 9 11 13.5  Creatinine 0.44 - 1.00 mg/dL 0.60 0.62 0.8  Sodium 135 - 145 mmol/L 132(L) 129(L) 137  Potassium 3.5 - 5.1 mmol/L 3.5 3.5 3.4(L)  Chloride 101 - 111 mmol/L 104 99(L) -  CO2 22 - 32 mmol/L 22 20(L) 23  Calcium 8.9 - 10.3 mg/dL 8.2(L) 8.5(L) 9.2  Total Protein 6.5 - 8.1 g/dL - 7.1 8.0  Total Bilirubin 0.3 - 1.2 mg/dL - 1.1 1.10  Alkaline Phos 38 - 126 U/L - 84 126  AST 15 - 41 U/L - 13(L) 15  ALT 14 - 54 U/L - 16 40     RADIOGRAPHIC STUDIES: I have personally reviewed the radiological images as listed and agreed with the findings in the report. Dg Chest 2 View  Result Date: 08/10/2017 CLINICAL DATA:  Weakness, fever EXAM: CHEST  2 VIEW COMPARISON:  09/29/2015 FINDINGS: Prior median sternotomy. Heart is normal size. Lungs are clear. No effusions. No acute bony abnormality. IMPRESSION: No active cardiopulmonary disease. Electronically Signed   By: Rolm Baptise M.D.   On: 08/10/2017 11:00    ASSESSMENT & PLAN:  61 y.o. caucasian female who is currently admitted for:   1. H/o Multiple myeloma - currently in remission on maintenance Revlimid  IgA lambda R-ISS stage II multiple myeloma with innumerable lytic lesions in the calvarium, lesion in T7 vertebra and multiple lesions in the pelvis.  No significant bone pain at this time. Diagnosed in January 2017.  Current treatment  -Revlimid maintenance 45m po daily  Previous Treatment  Status post Vd x 1 cycle VRd x 6 cycles  HD Melphalan 2094mm2 on 03/22/2016 Autologous HSCT on 03/23/2016 [facility]10^6/kg)  PLAN -currently holding Revlimid until patient feels better. -At this time she has been off the Revlimid for nearly a week.  We will continue to keep off Revlimid at this  time.  2.  New onset progressive persistent fatigue for the last 2 weeks.  Associated with generalized jitteriness ?muscle fasiculations. patient associates this with having recent posttransplant vaccination about 2 weeks ago. No rashes.  No focal neurological deficits.  Has recently had what appears to be a viral URI. TSH levels within normal limits. CK levels within normal limits.  3.  Poor appetite with early satiety .  Poorly defined middle to upper abdominal discomfort.  4.  Mild anemia.  Patient noted to have some vaginal bleeding. We will need to monitor.  5.  Mild UTI.  No overt symptoms.  Some lower abdominal discomfort.  Urine cultures pending. -Patient started on levofloxacin for UTI. -Follow-up urine cultures.  Plan -Patient at this time appears very fatigued and has very poor oral intake.  Has received IV fluids and electrolyte replacement. -We will get CT abdomen pelvis to evaluate her abdominal pain. -Empiric treatment with levofloxacin for UTI. -Dietitian input -TSH checked and within normal limits -EMG/nerve conduction studies to rule out post vaccinal neuropathies. -would recommend neurology consultation to weigh in on possible etiology of muscle fasciculations/Jitteriness -?post viral fatigue syndrome -checking for EBV and CMV infection with serologies. -A.m. cortisol level -Physical therapy evaluation and management -Monitor hemoglobin levels and consider fecal occult blood testing. -Will do low-dose dexamethasone to help with appetite. - will consider low dose Clonazepam to help her with her overall anxiety and jitteriness if EMG/NCS show no other evidence of pathology. -She adamantly has denies any aspect of this being related to depression.       GaSullivan LoneD MSPlainviewAHIVMS SCDoctors Hospital Of SarasotaTSt. Bernards Behavioral Healthematology/Oncology Physician CoLake Tahoe Surgery Center(Office):       [phone] cell):  [phone]):           [phone]/16/2018 1:31 PM

## 2017-08-10 NOTE — Progress Notes (Signed)
Moderate to severe akathisia [id number] Relief noted after ativan/benadryl/xanax/inderal/ativan adm  Slept approx 5 hours

## 2017-08-10 NOTE — Consult Note (Addendum)
Neurology Consultation Reason for Consult: Jitteriness Referring Physician: Dillard Cannon  CC: Jitteriness  History is obtained from: Patient  HPI: Paula Johnson is a 61 y.o. female with fatigue, malaise, jitteriness that is been going on for a few weeks.  She states that she received a vaccination and subsequently developed a fever of 101.  Since that time, she has felt jittery and been unable to sleep.  She describes an internal sensation of restlessness sometimes feeling it in her stomach and sometimes feeling it within her muscles.  She denies any weakness, numbness, weight gain, weight loss, muscle twitching.  Of note, she has had some issues with restless legs in the past.  ROS: A 14 point ROS was performed and is negative except as noted in the HPI.   Past Medical History:  Diagnosis Date  . Abnormal Pap smear of vagina 03/22/2017   ASCUS pap and negative HR HPV.  Patient is on immunosuppressive medication.   . Cancer ([place]) 1999   squamous cell carcinoma of thymus  . Fibroid    reason for Hysterectomy  . Multiple myeloma ([place]) 08/2015  . Pain aggravated by activities of daily living    states she pulled something at right sternum in dec 2016  . PONV (postoperative nausea and vomiting)    thinks morphine caused nausea  . Prediabetes   . Urinary incontinence      Family History  Problem Relation Age of Onset  . COPD Father        dec age 39/s-smoked  . Diabetes Brother   . Other Brother        committed suicide age 62  . Hyperlipidemia Mother   . Diabetes Maternal Grandmother      Social History:  reports that  has never smoked. she has never used smokeless tobacco. She reports that she does not drink alcohol or use drugs.   Exam: Current vital signs: BP (!) 142/80 (BP Location: Right Arm)   Pulse 81   Temp 98.5 F (36.9 C) (Oral)   Resp 14   Ht _0  (1.727 m)   Wt 58.9 kg (129 lb 13.6 oz)   SpO2 100%   BMI 19.74 kg/m  Vital signs in last 24 hours: Temp:   [98.5 F (36.9 C)-99.6 F (37.6 C)] 98.5 F (36.9 C) (11/16 1333) Pulse Rate:  [71-90] 81 (11/16 1333) Resp:  [14-20] 14 (11/16 1333) BP: (142-152)/(80-94) 142/80 (11/16 1333) SpO2:  [95 %-100 %] 100 % (11/16 1333)   Physical Exam  Constitutional: Appears well-developed and well-nourished.  Psych: Affect appropriate to situation Eyes: No scleral injection HENT: No OP obstrucion Head: Normocephalic.  Cardiovascular: Normal rate and regular rhythm.  Respiratory: Effort normal and breath sounds normal to anterior ascultation GI: Soft.  No distension. There is no tenderness.  Skin: WDI  Neuro: Mental Status: Patient is awake, alert, oriented to person, place, month, year, and situation. Patient is able to give a clear and coherent history. No signs of aphasia or neglect Cranial Nerves: II: Visual Fields are full. Pupils are equal, round, and reactive to light.   III,IV, VI: EOMI without ptosis or diploplia.  V: Facial sensation is symmetric to temperature VII: Facial movement is symmetric.  VIII: hearing is intact to voice X: Uvula elevates symmetrically XI: Shoulder shrug is symmetric. XII: tongue is midline without atrophy or fasciculations.  Motor: Tone is normal. Bulk is normal. 5/5 strength was present in all four extremities.  No fasciculations seen on my exam.  Sensory: Sensation is symmetric to light touch and temperature in the arms and legs. Deep Tendon Reflexes: 2+ and symmetric in the biceps and patellae.  Cerebellar: FNF intact bilaterally   I have reviewed labs in epic and the results pertinent to this consultation are: Mild hyponatremia  Impression: 61 yo F with akithisia, insomnia.  There are no findings on exam or by history concerning for acute neuropathy, and I do not think an EMG would be helpful.  Some of her symptoms do sound similar to what patients with restless leg syndrome described, though unclear why she would get so much worse all of a sudden.   I think that symptomatically, I would approach it like restless legs.  Another consideration is that this could be a manifestation of anxiety.  Recommendations: 1) gabapentin 371m qhs, could increase this to 600 mg if tolerated 2) I would check for iron deficiency as this can precipitate restless legs 3) no further recommendations at this time, neurology will sign off, please call with further questions or concerns.  MRoland Rack MD [facility] [phone] If 7pm- 7am, please page neurology on call as listed in AJefferson

## 2017-08-10 NOTE — Progress Notes (Signed)
Initial Nutrition Assessment  DOCUMENTATION CODES:   Non-severe (moderate) malnutrition in context of chronic illness  INTERVENTION:   Ensure Enlive po TID, each supplement provides 350 kcal and 20 grams of protein  NUTRITION DIAGNOSIS:   Moderate Malnutrition related to cancer and cancer related treatments, chronic illness as evidenced by moderate fat depletion, moderate muscle depletion.  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 Recently followed up at [facility] for post transplant vaccination on 07/24/17 with HIB, IPV (IPOL), Hep B, and Prevnar 13. Presents this admission with complaints of jitters, upset stomach, and decrease PO intake.   Spoke with pt at bedside.  Reports having loss in appetite for one week prior to admission related to increased fatigue and early satiety.  States before this week she was consuming three meals per day with snacks that has since decreased to a few bites per meal.  Pt denies supplementation use at home. States she has been on treatments (revlimid) for multiple myeloma for over a year.  Reports a UBW of 130 lb with a 3 lb weight loss within the last week. Records indicate pt has maintained her weight of 130-135 lb for over one year. Do not suspect any significant weight loss.  Nutrition-Focused physical exam completed. See findings below.   Medications reviewed and include: calcium-vitamin D, mag oxide, NS @ 100 ml/hr Labs reviewed: Na 132 (L)   NUTRITION - FOCUSED PHYSICAL EXAM:    Most Recent Value  Orbital Region  No depletion  Upper Arm Region  Moderate depletion  Thoracic and Lumbar Region  Moderate depletion  Buccal Region  No  depletion  Temple Region  Moderate depletion  Clavicle Bone Region  Mild depletion  Clavicle and Acromion Bone Region  Mild depletion  Scapular Bone Region  Mild depletion  Dorsal Hand  Moderate depletion  Patellar Region  Moderate depletion  Anterior Thigh Region  Moderate depletion  Posterior Calf Region  Moderate depletion  Edema (RD Assessment)  None  Hair  Reviewed  Eyes  Reviewed  Mouth  Reviewed  Skin  Reviewed  Nails  Reviewed       Diet Order:  Diet regular Room service appropriate? Yes; Fluid consistency: Thin  EDUCATION NEEDS:   Education needs have been addressed  Skin:  Skin Assessment: Reviewed RN Assessment  Last BM:  08/09/17  Height:   Ht Readings from Last 1 Encounters:  08/09/17 _0  (1.727 m)    Weight:   Wt Readings from Last 1 Encounters:  08/09/17 129 lb 13.6 oz (58.9 kg)    Ideal Body Weight:  63.6 kg  BMI:  Body mass index is 19.74 kg/m.  Estimated Nutritional Needs:   Kcal:  1700-1900 kcal/day  Protein:  80-90 g/day  Fluid:  >1.7 L/day    Mariana Single RD, LDN Clinical Nutrition Pager # - [phone]

## 2017-08-11 DIAGNOSIS — G2581 Restless legs syndrome: Secondary | ICD-10-CM

## 2017-08-11 DIAGNOSIS — R627 Adult failure to thrive: Secondary | ICD-10-CM | POA: Diagnosis not present

## 2017-08-11 DIAGNOSIS — N390 Urinary tract infection, site not specified: Secondary | ICD-10-CM | POA: Diagnosis not present

## 2017-08-11 DIAGNOSIS — R253 Fasciculation: Secondary | ICD-10-CM | POA: Diagnosis not present

## 2017-08-11 LAB — CMV IGM: CMV IgM: <30 AU/mL (ref 0.0–29.9)

## 2017-08-11 LAB — IRON AND TIBC
IRON: 73 ug/dL (ref 28–170)
SATURATION RATIOS: 26 % (ref 10.4–31.8)
TIBC: 283 ug/dL (ref 250–450)
UIBC: 210 ug/dL

## 2017-08-11 LAB — COMPREHENSIVE METABOLIC PANEL
ALK PHOS: 66 U/L (ref 38–126)
ALT: 14 U/L (ref 14–54)
AST: 13 U/L — ABNORMAL LOW (ref 15–41)
Albumin: 3.4 g/dL — ABNORMAL LOW (ref 3.5–5.0)
Anion gap: 6 (ref 5–15)
BILIRUBIN TOTAL: 1 mg/dL (ref 0.3–1.2)
BUN: 6 mg/dL (ref 6–20)
CO2: 23 mmol/L (ref 22–32)
Calcium: 8.1 mg/dL — ABNORMAL LOW (ref 8.9–10.3)
Chloride: 104 mmol/L (ref 101–111)
Creatinine, Ser: 0.63 mg/dL (ref 0.44–1.00)
GFR calc non Af Amer: >60 mL/min (ref >60)
Glucose, Bld: 124 mg/dL — ABNORMAL HIGH (ref 65–99)
POTASSIUM: 3.3 mmol/L — AB (ref 3.5–5.1)
SODIUM: 133 mmol/L — AB (ref 135–145)
Total Protein: 6.5 g/dL (ref 6.5–8.1)

## 2017-08-11 LAB — CBC WITH DIFFERENTIAL/PLATELET
Basophils Absolute: 0 10*3/uL (ref 0.0–0.1)
Basophils Relative: 1 %
EOS ABS: 0.1 10*3/uL (ref 0.0–0.7)
EOS PCT: 2 %
HCT: 31.9 % — ABNORMAL LOW (ref 36.0–46.0)
Hemoglobin: 11.2 g/dL — ABNORMAL LOW (ref 12.0–15.0)
LYMPHS ABS: 1 10*3/uL (ref 0.7–4.0)
Lymphocytes Relative: 31 %
MCH: 32 pg (ref 26.0–34.0)
MCHC: 35.1 g/dL (ref 30.0–36.0)
MCV: 91.1 fL (ref 78.0–100.0)
Monocytes Absolute: 0.6 10*3/uL (ref 0.1–1.0)
Monocytes Relative: 17 %
Neutro Abs: 1.6 10*3/uL — ABNORMAL LOW (ref 1.7–7.7)
Neutrophils Relative %: 49 %
PLATELETS: 297 10*3/uL (ref 150–400)
RBC: 3.5 MIL/uL — AB (ref 3.87–5.11)
RDW: 13.6 % (ref 11.5–15.5)
WBC: 3.3 10*3/uL — AB (ref 4.0–10.5)

## 2017-08-11 LAB — OSMOLALITY, URINE: Osmolality, Ur: 386 mOsm/kg (ref 300–900)

## 2017-08-11 LAB — CORTISOL
CORTISOL PLASMA: 6.1 ug/dL
Cortisol, Plasma: 6.2 ug/dL

## 2017-08-11 LAB — NA AND K (SODIUM & POTASSIUM), RAND UR
Potassium Urine: 13 mmol/L
Sodium, Ur: 90 mmol/L

## 2017-08-11 LAB — MAGNESIUM: MAGNESIUM: 2 mg/dL (ref 1.7–2.4)

## 2017-08-11 LAB — CMV ANTIBODY, IGG (EIA): CMV Ab - IgG: <0.6 U/mL (ref 0.00–0.59)

## 2017-08-11 LAB — URINE CULTURE

## 2017-08-11 LAB — VITAMIN B12: Vitamin B-12: 294 pg/mL (ref 180–914)

## 2017-08-11 LAB — FERRITIN: FERRITIN: 51 ng/mL (ref 11–307)

## 2017-08-11 MED ORDER — TRAZODONE HCL 50 MG PO TABS
50.0000 mg | ORAL_TABLET | Freq: Once | ORAL | Status: AC
  Administered 2017-08-11: 50 mg via ORAL
  Filled 2017-08-11: qty 1

## 2017-08-11 MED ORDER — FAMOTIDINE 20 MG PO TABS
20.0000 mg | ORAL_TABLET | Freq: Two times a day (BID) | ORAL | Status: DC
  Administered 2017-08-11 – 2017-08-13 (×4): 20 mg via ORAL
  Filled 2017-08-11 (×4): qty 1

## 2017-08-11 MED ORDER — POTASSIUM CHLORIDE 20 MEQ/15ML (10%) PO SOLN
40.0000 meq | Freq: Every day | ORAL | Status: DC
  Filled 2017-08-11: qty 30

## 2017-08-11 MED ORDER — POTASSIUM CHLORIDE CRYS ER 20 MEQ PO TBCR
40.0000 meq | EXTENDED_RELEASE_TABLET | Freq: Every day | ORAL | Status: DC
  Administered 2017-08-11 – 2017-08-13 (×3): 40 meq via ORAL
  Filled 2017-08-11 (×3): qty 2

## 2017-08-11 NOTE — Evaluation (Signed)
Physical Therapy Evaluation Patient Details Name: Paula Johnson MRN: [id number] DOB: 19-Dec-1955 Today's Date: 08/11/2017   History of Present Illness  61 year old female with history of multiple myeloma status post autologous bone marrow transplant in June 2017, recently received multiple posttransplant vaccination at [facility] on 10/30, presented with feeling sick, jittery stomach upset and decreased oral intake.  She was admitted directly from oncology clinic  Clinical Impression  Patient evaluated by Physical Therapy with no further acute PT needs identified. All education has been completed and the patient has no further questions.  See below for any follow-up Physical Therapy or equipment needs. PT is signing off. Thank you for this referral. See gait section for details; pt without LOB with turns, head turns, velocity increased with distance to WNL; no furhter needs     Follow Up Recommendations No PT follow up    Equipment Recommendations  None recommended by PT    Recommendations for Other Services       Precautions / Restrictions Precautions Precautions: None      Mobility  Bed Mobility Overal bed mobility: Independent                Transfers Overall transfer level: Independent                  Ambulation/Gait Ambulation/Gait assistance: Supervision Ambulation Distance (Feet): 280 Feet Assistive device: None Gait Pattern/deviations: Step-through pattern;WFL(Within Functional Limits)     General Gait Details: grossly WFL; no dizziness, HR 90s, slight drifting initially but no LOB; incr stability with incr distance; supervision for safety initially-->IND as distance progressed  Stairs            Wheelchair Mobility    Modified Rankin (Stroke Patients Only)       Balance Overall balance assessment: Independent(no falls)                                           Pertinent Vitals/Pain Pain Assessment: No/denies pain     Home Living Family/patient expects to be discharged to:: Private residence Living Arrangements: Spouse/significant other Available Help at Discharge: Family Type of Home: House Home Access: Stairs to enter   Technical brewer of Steps: 5 Home Layout: One level Home Equipment: None      Prior Function Level of Independence: Independent               Hand Dominance        Extremity/Trunk Assessment   Upper Extremity Assessment Upper Extremity Assessment: Overall WFL for tasks assessed    Lower Extremity Assessment Lower Extremity Assessment: Overall WFL for tasks assessed       Communication   Communication: No difficulties  Cognition Arousal/Alertness: Awake/alert Behavior During Therapy: WFL for tasks assessed/performed Overall Cognitive Status: Within Functional Limits for tasks assessed                                        General Comments      Exercises     Assessment/Plan    PT Assessment Patent does not need any further PT services  PT Problem List         PT Treatment Interventions      PT Goals (Current goals can be found in the Care Plan section)  Acute Rehab PT  Goals Patient Stated Goal: home soon PT Goal Formulation: All assessment and education complete, DC therapy    Frequency     Barriers to discharge        Co-evaluation               AM-PAC PT "6 Clicks" Daily Activity  Outcome Measure Difficulty turning over in bed (including adjusting bedclothes, sheets and blankets)?: None Difficulty moving from lying on back to sitting on the side of the bed? : None Difficulty sitting down on and standing up from a chair with arms (e.g., wheelchair, bedside commode, etc,.)?: None Help needed moving to and from a bed to chair (including a wheelchair)?: None Help needed walking in hospital room?: None Help needed climbing 3-5 steps with a railing? : A Little 6 Click Score: 23    End of Session    Activity Tolerance: Patient tolerated treatment well Patient left: in chair;with call bell/phone within reach   PT Visit Diagnosis: Difficulty in walking, not elsewhere classified (R26.2)    Time: 7185-5015 PT Time Calculation (min) (ACUTE ONLY): 11 min   Charges:   PT Evaluation $PT Eval Low Complexity: 1 Low     PT G Codes:   PT G-Codes **NOT FOR INPATIENT CLASS** Functional Assessment Tool Used: AM-PAC 6 Clicks Basic Mobility;Clinical judgement Functional Limitation: Mobility: Walking and moving around Mobility: Walking and Moving Around Current Status (A6825): At least 1 percent but less than 20 percent impaired, limited or restricted Mobility: Walking and Moving Around Goal Status [phone]): 0 percent impaired, limited or restricted Mobility: Walking and Moving Around Discharge Status [phone]): 0 percent impaired, limited or restricted      [facility] 08/11/2017, 12:47 PM

## 2017-08-11 NOTE — Progress Notes (Signed)
PHARMACIST - PHYSICIAN COMMUNICATION  DR:   Erlinda Hong  CONCERNING: IV to Oral Route Change Policy  RECOMMENDATION: This patient is receiving Pepcid by the intravenous route.  Based on criteria approved by the Pharmacy and Therapeutics Committee, the intravenous medication(s) is/are being converted to the equivalent oral dose form(s).   DESCRIPTION: These criteria include:  The patient is eating (either orally or via tube) and/or has been taking other orally administered medications for a least 24 hours  The patient has no evidence of active gastrointestinal bleeding or impaired GI absorption (gastrectomy, short bowel, patient on TNA or NPO).  If you have questions about this conversion, please contact the Pharmacy Department  []   [phone] )  [facility] []   ( [phone] )  [facility] []   [phone] )  [facility] []   [phone] )  [facility] [x]   [phone] )  [place], [place], [facility] 08/11/2017 12:28 PM

## 2017-08-11 NOTE — Progress Notes (Signed)
PROGRESS NOTE    Paula Johnson  YOK:[id number] DOB: Feb 24, 1956 DOA: 08/09/2017 PCP: [name], MD   Brief Narrative: 61 year old female with history of multiple myeloma status post autologous bone marrow transplant in June 2017, recently received multiple posttransplant vaccination at [facility] on 10/30, presented with feeling sick, jittery stomach upset and decreased oral intake.  She was admitted directly from oncology clinic.  Patient is still reported decreased oral intake and overall not feeling well.  Assessment & Plan:  #Generalized weakness, decreased appetite and failure to thrive in adult: -Patient with multiple myeloma and undergoing chemotherapy with oncology.  CT scan of abdomen pelvis with no acute finding except new hypoattenuating lesion in the left liver likely focal fatty deposition and scattered skeletal findings compatible with history of multiple myeloma. -Vitamin B12 level, a.m. cortisol, TSH, CK level acceptable -Dietary referral, continue IV fluid and supportive care. -On low-dose dexamethasone to help with appetite by oncology  #Hyponatremia/hypokalemia: Likely due to decreased oral intake.  Check urine electrolytes and osmolality.  Repleted potassium.  Repeat lab in the morning.  #Asymptomatic bacteriuria: Patient is currently on Levaquin.  Follow-up culture results.  #Jerky movement/jitteriness likely restless leg syndrome as per neurology: Iron stores acceptable.  EMG ordered by oncology.  Neurology consult appreciated.  PT/OT evaluation.  #History of multiple myeloma: Per oncology.  Revlimid on hold now.  Moderate protein calorie malnutrition: Continue oral supplement, dietary referral.  DVT prophylaxis: Lovenox subcutaneous Code Status: Full code Family Communication: No family at bedside Disposition Plan: Currently admitted    Consultants:   Oncology  Procedures: None Antimicrobials: Levaquin  Subjective: Seen and examined at bedside.   Reported generalized weakness and decreased oral intake.  Denies dysuria, urgency, chest pain, shortness of breath, headache or dizziness.  Objective: Vitals:   08/10/17 1333 08/10/17 2122 08/11/17 0640 08/11/17 0936  BP: (!) 142/80 (!) 141/75 (!) 145/86 129/77  Pulse: 81 79 86 85  Resp: 14 16 15 10   Temp: 98.5 F (36.9 C) 98.5 F (36.9 C) 99 F (37.2 C) 97.8 F (36.6 C)  TempSrc: Oral Oral Oral Oral  SpO2: 100% 98% 98% 98%  Weight:      Height:        Intake/Output Summary (Last 24 hours) at 08/11/2017 1204 Last data filed at 08/11/2017 1003 Gross per 24 hour  Intake 2690 ml  Output -  Net 2690 ml   Filed Weights   08/09/17 1537  Weight: 58.9 kg (129 lb 13.6 oz)    Examination:  General exam: Appears calm and comfortable  Respiratory system: Clear to auscultation. Respiratory effort normal. No wheezing or crackle Cardiovascular system: S1 & S2 heard, RRR.  No pedal edema. Gastrointestinal system: Abdomen is nondistended, soft and nontender. Normal bowel sounds heard. Central nervous system: Alert and oriented. No focal neurological deficits. Extremities: Symmetric 5 x 5 power. Skin: No rashes, lesions or ulcers Psychiatry: Judgement and insight appear normal. Mood & affect appropriate.     Data Reviewed: I have personally reviewed following labs and imaging studies  CBC: Recent Labs  Lab 08/09/17 1720 08/10/17 0527 08/11/17 0606  WBC 3.1* 3.3* 3.3*  NEUTROABS 1.5* 1.7 1.6*  HGB 11.9* 11.0* 11.2*  HCT 34.0* 31.9* 31.9*  MCV 91.4 91.9 91.1  PLT 294 279 395   Basic Metabolic Panel: Recent Labs  Lab 08/09/17 1720 08/10/17 0527 08/11/17 0606  NA 129* 132* 133*  K 3.5 3.5 3.3*  CL 99* 104 104  CO2 20* 22 23  GLUCOSE 93  102* 124*  BUN 11 9 6   CREATININE 0.62 0.60 0.63  CALCIUM 8.5* 8.2* 8.1*  MG 1.9  --  2.0   GFR: Estimated Creatinine Clearance: 66.9 mL/min (by C-G formula based on SCr of 0.63 mg/dL). Liver Function Tests: Recent Labs  Lab  08/09/17 1720 08/11/17 0606  AST 13* 13*  ALT 16 14  ALKPHOS 84 66  BILITOT 1.1 1.0  PROT 7.1 6.5  ALBUMIN 3.7 3.4*   No results for input(s): LIPASE, AMYLASE in the last 168 hours. No results for input(s): AMMONIA in the last 168 hours. Coagulation Profile: No results for input(s): INR, PROTIME in the last 168 hours. Cardiac Enzymes: Recent Labs  Lab 08/10/17 0527  CKTOTAL 100   BNP (last 3 results) No results for input(s): PROBNP in the last 8760 hours. HbA1C: Recent Labs    08/09/17 1720  HGBA1C 5.6   CBG: No results for input(s): GLUCAP in the last 168 hours. Lipid Profile: No results for input(s): CHOL, HDL, LDLCALC, TRIG, CHOLHDL, LDLDIRECT in the last 72 hours. Thyroid Function Tests: Recent Labs    08/10/17 0527  TSH 3.200   Anemia Panel: Recent Labs    08/11/17 0606  VITAMINB12 294  FERRITIN 51  TIBC 283  IRON 73   Sepsis Labs: No results for input(s): PROCALCITON, LATICACIDVEN in the last 168 hours.  Recent Results (from the past 240 hour(s))  Culture, Urine     Status: Abnormal   Collection Time: 08/09/17  8:26 PM  Result Value Ref Range Status   Specimen Description URINE, CLEAN CATCH  Final   Special Requests NONE  Final   Culture MULTIPLE SPECIES PRESENT, SUGGEST RECOLLECTION (A)  Final   Report Status 08/11/2017 FINAL  Final         Radiology Studies: Dg Chest 2 View  Result Date: 08/10/2017 CLINICAL DATA:  Weakness, fever EXAM: CHEST  2 VIEW COMPARISON:  09/29/2015 FINDINGS: Prior median sternotomy. Heart is normal size. Lungs are clear. No effusions. No acute bony abnormality. IMPRESSION: No active cardiopulmonary disease. Electronically Signed   By: Rolm Baptise M.D.   On: 08/10/2017 11:00   Ct Abdomen Pelvis W Contrast  Result Date: 08/11/2017 CLINICAL DATA:  Loss of appetite.  History of multiple myeloma. EXAM: CT ABDOMEN AND PELVIS WITH CONTRAST TECHNIQUE: Multidetector CT imaging of the abdomen and pelvis was performed  using the standard protocol following bolus administration of intravenous contrast. CONTRAST:  179m ISOVUE-300 IOPAMIDOL (ISOVUE-300) INJECTION 61% COMPARISON:  11/20/2015 FINDINGS: Lower chest:  Unremarkable. Hepatobiliary: A 16 mm hypoattenuating subcapsular lesion medial segment left liver adjacent to falciform ligament may be related to focal fatty deposition. There is no evidence for gallstones, gallbladder wall thickening, or pericholecystic fluid. No intrahepatic or extrahepatic biliary dilation. Pancreas: No focal mass lesion. No dilatation of the main duct. No intraparenchymal cyst. No peripancreatic edema. Spleen: No splenomegaly. No focal mass lesion. Adrenals/Urinary Tract: The No adrenal nodule or mass. Tiny hypoattenuating cortical lesions in each kidney are too small to characterize but likely cysts. Central sinus cysts are noted in the left kidney. No evidence for hydroureter. The urinary bladder appears normal for the degree of distention. Stomach/Bowel: Stomach is nondistended. No gastric wall thickening. No evidence of outlet obstruction. Duodenum is normally positioned as is the ligament of Treitz. No small bowel wall thickening. No small bowel dilatation. The terminal ileum is normal. The appendix is normal. Right colon with relatively prominent stool volume, nonspecific finding. Vascular/Lymphatic: No abdominal aortic aneurysm. No abdominal aortic  atherosclerotic calcification. There is no gastrohepatic or hepatoduodenal ligament lymphadenopathy. No intraperitoneal or retroperitoneal lymphadenopathy. No pelvic sidewall lymphadenopathy. Reproductive: Uterus surgically absent.  There is no adnexal mass. Other: No intraperitoneal free fluid. Musculoskeletal: Scattered lucent lesions throughout the skeleton likely related to the reported clinical history of multiple myeloma. Old posterior right rib fractures evident. IMPRESSION: 1. No acute findings in the abdomen or pelvis. Specifically, no  findings to explain the patient's history of decreased appetite. 2. New 16 mm hypoattenuating lesion medial segment left liver is in a characteristic location for focal fatty deposition, but MRI of the abdomen without with contrast is recommended to confirm. 3. Scattered skeletal lucencies compatible with the reported clinical history of multiple myeloma. Electronically Signed   By: Misty Stanley M.D.   On: 08/11/2017 09:39        Scheduled Meds: . aspirin EC  81 mg Oral Daily  . B-complex with vitamin C  1 tablet Oral Daily  . calcium-vitamin D  1 tablet Oral BID  . chlorhexidine  15 mL Mouth Rinse BID  . dexamethasone  4 mg Oral Q breakfast  . enoxaparin (LOVENOX) injection  40 mg Subcutaneous Q24H  . feeding supplement (ENSURE ENLIVE)  237 mL Oral TID BM  . gabapentin  300 mg Oral QHS  . levofloxacin  250 mg Oral Daily  . magnesium oxide  200 mg Oral Daily  . mouth rinse  15 mL Mouth Rinse q12n4p  . potassium chloride  40 mEq Oral Daily  . propranolol  10 mg Oral BID   Continuous Infusions: . sodium chloride 100 mL/hr at 08/10/17 1618  . famotidine (PEPCID) IV Stopped (08/11/17 1046)     LOS: 1 day    Nyeemah Jennette Tanna Furry, MD [facility] Pager [phone]  If 7PM-7AM, please contact night-coverage [email or web address] Password [email or web address] 08/11/2017, 12:04 PM

## 2017-08-12 DIAGNOSIS — D649 Anemia, unspecified: Secondary | ICD-10-CM | POA: Diagnosis present

## 2017-08-12 DIAGNOSIS — E876 Hypokalemia: Secondary | ICD-10-CM | POA: Diagnosis present

## 2017-08-12 DIAGNOSIS — Z880 Allergy status to penicillin: Secondary | ICD-10-CM | POA: Diagnosis not present

## 2017-08-12 DIAGNOSIS — Z90710 Acquired absence of both cervix and uterus: Secondary | ICD-10-CM | POA: Diagnosis not present

## 2017-08-12 DIAGNOSIS — R253 Fasciculation: Secondary | ICD-10-CM | POA: Diagnosis not present

## 2017-08-12 DIAGNOSIS — Z85238 Personal history of other malignant neoplasm of thymus: Secondary | ICD-10-CM | POA: Diagnosis not present

## 2017-08-12 DIAGNOSIS — E871 Hypo-osmolality and hyponatremia: Secondary | ICD-10-CM | POA: Diagnosis present

## 2017-08-12 DIAGNOSIS — K5909 Other constipation: Secondary | ICD-10-CM | POA: Diagnosis present

## 2017-08-12 DIAGNOSIS — C9001 Multiple myeloma in remission: Secondary | ICD-10-CM | POA: Diagnosis present

## 2017-08-12 DIAGNOSIS — Z681 Body mass index (BMI) 19 or less, adult: Secondary | ICD-10-CM | POA: Diagnosis not present

## 2017-08-12 DIAGNOSIS — N939 Abnormal uterine and vaginal bleeding, unspecified: Secondary | ICD-10-CM | POA: Diagnosis present

## 2017-08-12 DIAGNOSIS — G4733 Obstructive sleep apnea (adult) (pediatric): Secondary | ICD-10-CM | POA: Diagnosis present

## 2017-08-12 DIAGNOSIS — E860 Dehydration: Secondary | ICD-10-CM | POA: Diagnosis present

## 2017-08-12 DIAGNOSIS — G4700 Insomnia, unspecified: Secondary | ICD-10-CM | POA: Diagnosis present

## 2017-08-12 DIAGNOSIS — N390 Urinary tract infection, site not specified: Secondary | ICD-10-CM | POA: Diagnosis present

## 2017-08-12 DIAGNOSIS — Z7982 Long term (current) use of aspirin: Secondary | ICD-10-CM | POA: Diagnosis not present

## 2017-08-12 DIAGNOSIS — R6881 Early satiety: Secondary | ICD-10-CM | POA: Diagnosis present

## 2017-08-12 DIAGNOSIS — Z79899 Other long term (current) drug therapy: Secondary | ICD-10-CM | POA: Diagnosis not present

## 2017-08-12 DIAGNOSIS — Z9481 Bone marrow transplant status: Secondary | ICD-10-CM | POA: Diagnosis not present

## 2017-08-12 DIAGNOSIS — F419 Anxiety disorder, unspecified: Secondary | ICD-10-CM | POA: Diagnosis present

## 2017-08-12 DIAGNOSIS — R627 Adult failure to thrive: Secondary | ICD-10-CM | POA: Diagnosis present

## 2017-08-12 DIAGNOSIS — Z7951 Long term (current) use of inhaled steroids: Secondary | ICD-10-CM | POA: Diagnosis not present

## 2017-08-12 DIAGNOSIS — R5383 Other fatigue: Secondary | ICD-10-CM | POA: Diagnosis not present

## 2017-08-12 DIAGNOSIS — Z9221 Personal history of antineoplastic chemotherapy: Secondary | ICD-10-CM | POA: Diagnosis not present

## 2017-08-12 DIAGNOSIS — E440 Moderate protein-calorie malnutrition: Secondary | ICD-10-CM | POA: Diagnosis present

## 2017-08-12 DIAGNOSIS — G2581 Restless legs syndrome: Secondary | ICD-10-CM | POA: Diagnosis present

## 2017-08-12 LAB — RENAL FUNCTION PANEL
Albumin: 3.3 g/dL — ABNORMAL LOW (ref 3.5–5.0)
Anion gap: 6 (ref 5–15)
BUN: 8 mg/dL (ref 6–20)
CHLORIDE: 105 mmol/L (ref 101–111)
CO2: 22 mmol/L (ref 22–32)
Calcium: 8.4 mg/dL — ABNORMAL LOW (ref 8.9–10.3)
Creatinine, Ser: 0.6 mg/dL (ref 0.44–1.00)
GFR calc Af Amer: >60 mL/min (ref >60)
Glucose, Bld: 98 mg/dL (ref 65–99)
POTASSIUM: 3.6 mmol/L (ref 3.5–5.1)
Phosphorus: 2 mg/dL — ABNORMAL LOW (ref 2.5–4.6)
Sodium: 133 mmol/L — ABNORMAL LOW (ref 135–145)

## 2017-08-12 MED ORDER — GABAPENTIN 300 MG PO CAPS
600.0000 mg | ORAL_CAPSULE | Freq: Every day | ORAL | Status: DC
  Administered 2017-08-12: 600 mg via ORAL
  Filled 2017-08-12: qty 2

## 2017-08-12 NOTE — Progress Notes (Signed)
OT Cancellation Note  Patient Details Name: Paula Johnson MRN: [id number] DOB: Jan 20, 1956   Cancelled Treatment:    Reason Eval/Treat Not Completed: OT screened, no needs identified, will sign off. Per PT eval; pt independent with mobility. Pt reports no functional deficits at this time. No acute OT needs identified; will screen and sign off at this time. Please re-consult if needs change. Thank you for this referral.  Binnie Kand M.S., OTR/L Pager: [phone]  08/12/2017, 1:46 PM

## 2017-08-12 NOTE — Progress Notes (Signed)
PROGRESS NOTE    Paula Johnson  KPV:[id number] DOB: 12-21-1955 DOA: 08/09/2017 PCP: [name], MD   Brief Narrative: 61 year old female with history of multiple myeloma status post autologous bone marrow transplant in June 2017, recently received multiple posttransplant vaccination at [facility] on 10/30, presented with feeling sick, jittery stomach upset and decreased oral intake.  She was admitted directly from oncology clinic.  Patient is still reported decreased oral intake and overall not feeling well.  Assessment & Plan:  #Generalized weakness, decreased appetite and failure to thrive in adult: -Patient with multiple myeloma and undergoing chemotherapy with oncology.  CT scan of abdomen pelvis with no acute finding except new hypoattenuating lesion in the left liver likely focal fatty deposition and scattered skeletal findings compatible with history of multiple myeloma. The findings discussed with the patient today. Plan for MRI abdomen w/o contrast. -Vitamin B12 level, a.m. cortisol, TSH, CK level acceptable -Dietary referral, continue IV fluid and supportive care. -On low-dose dexamethasone to help with appetite by oncology  #Hyponatremia/hypokalemia: Likely due to decreased oral intake. Improving. Continue KCL.  #Asymptomatic bacteriuria: Culture with likely contaminant.  Patient does not have urinary symptoms.  Already received 3 days of Levaquin, discontinue today.  #Jerky movement/jitteriness likely restless leg syndrome as per neurology: Iron stores acceptable.  EMG ordered by oncology.   -I have discussed with Dr. Leonel Ramsay from neurologist who does not think this is neurological process.  I increase the dose of Neurontin to 600 nightly.  Continue PT OT evaluation and supportive care.  #History of multiple myeloma: Per oncology.  Revlimid on hold now.  Moderate protein calorie malnutrition: Continue oral supplement, dietary referral.  DVT prophylaxis: Lovenox  subcutaneous Code Status: Full code Family Communication: No family at bedside Disposition Plan: Currently admitted    Consultants:   Oncology  Procedures: None Antimicrobials: Levaquin dc today  Subjective: Seen and examined at bedside.  Concerned about her jittery movement last night.  Discussed about the abnormal CT scan finding and wanted to pursue MRI of abdomen without contrast.  Denied nausea vomiting chest pain or shortness of breath.  No urinary symptoms.  No headache or dizziness.  Objective: Vitals:   08/11/17 0936 08/11/17 1423 08/11/17 2140 08/12/17 0550  BP: 129/77 132/76 (!) 158/85 (!) 144/77  Pulse: 85 77 90 93  Resp: 10 16 18 18   Temp: 97.8 F (36.6 C) 98.5 F (36.9 C) 98.3 F (36.8 C) 98.2 F (36.8 C)  TempSrc: Oral Oral Oral Oral  SpO2: 98% 98% 98% 98%  Weight:      Height:        Intake/Output Summary (Last 24 hours) at 08/12/2017 1025 Last data filed at 08/12/2017 0550 Gross per 24 hour  Intake 2045.83 ml  Output -  Net 2045.83 ml   Filed Weights   08/09/17 1537  Weight: 58.9 kg (129 lb 13.6 oz)    Examination:  General exam: Not in distress Respiratory system: Clear bilateral, respiratory effort normal, no wheezing or crackle cardiovascular system: Regular rate rhythm, S1-S2 normal.  No pedal edema. Gastrointestinal system: Abdomen is nondistended, soft and nontender. Normal bowel sounds heard. Central nervous system: Alert and oriented. No focal neurological deficits. Extremities: Symmetric 5 x 5 power. Skin: No rashes, lesions or ulcers Psychiatry: Judgement and insight appear normal. Mood & affect appropriate.     Data Reviewed: I have personally reviewed following labs and imaging studies  CBC: Recent Labs  Lab 08/09/17 1720 08/10/17 0527 08/11/17 0606  WBC 3.1* 3.3* 3.3*  NEUTROABS 1.5* 1.7 1.6*  HGB 11.9* 11.0* 11.2*  HCT 34.0* 31.9* 31.9*  MCV 91.4 91.9 91.1  PLT 294 279 476   Basic Metabolic Panel: Recent Labs    Lab 08/09/17 1720 08/10/17 0527 08/11/17 0606 08/12/17 0536  NA 129* 132* 133* 133*  K 3.5 3.5 3.3* 3.6  CL 99* 104 104 105  CO2 20* 22 23 22   GLUCOSE 93 102* 124* 98  BUN 11 9 6 8   CREATININE 0.62 0.60 0.63 0.60  CALCIUM 8.5* 8.2* 8.1* 8.4*  MG 1.9  --  2.0  --   PHOS  --   --   --  2.0*   GFR: Estimated Creatinine Clearance: 66.9 mL/min (by C-G formula based on SCr of 0.6 mg/dL). Liver Function Tests: Recent Labs  Lab 08/09/17 1720 08/11/17 0606 08/12/17 0536  AST 13* 13*  --   ALT 16 14  --   ALKPHOS 84 66  --   BILITOT 1.1 1.0  --   PROT 7.1 6.5  --   ALBUMIN 3.7 3.4* 3.3*   No results for input(s): LIPASE, AMYLASE in the last 168 hours. No results for input(s): AMMONIA in the last 168 hours. Coagulation Profile: No results for input(s): INR, PROTIME in the last 168 hours. Cardiac Enzymes: Recent Labs  Lab 08/10/17 0527  CKTOTAL 100   BNP (last 3 results) No results for input(s): PROBNP in the last 8760 hours. HbA1C: Recent Labs    08/09/17 1720  HGBA1C 5.6   CBG: No results for input(s): GLUCAP in the last 168 hours. Lipid Profile: No results for input(s): CHOL, HDL, LDLCALC, TRIG, CHOLHDL, LDLDIRECT in the last 72 hours. Thyroid Function Tests: Recent Labs    08/10/17 0527  TSH 3.200   Anemia Panel: Recent Labs    08/11/17 0606  VITAMINB12 294  FERRITIN 51  TIBC 283  IRON 73   Sepsis Labs: No results for input(s): PROCALCITON, LATICACIDVEN in the last 168 hours.  Recent Results (from the past 240 hour(s))  Culture, Urine     Status: Abnormal   Collection Time: 08/09/17  8:26 PM  Result Value Ref Range Status   Specimen Description URINE, CLEAN CATCH  Final   Special Requests NONE  Final   Culture MULTIPLE SPECIES PRESENT, SUGGEST RECOLLECTION (A)  Final   Report Status 08/11/2017 FINAL  Final         Radiology Studies: Dg Chest 2 View  Result Date: 08/10/2017 CLINICAL DATA:  Weakness, fever EXAM: CHEST  2 VIEW  COMPARISON:  09/29/2015 FINDINGS: Prior median sternotomy. Heart is normal size. Lungs are clear. No effusions. No acute bony abnormality. IMPRESSION: No active cardiopulmonary disease. Electronically Signed   By: Rolm Baptise M.D.   On: 08/10/2017 11:00   Ct Abdomen Pelvis W Contrast  Result Date: 08/11/2017 CLINICAL DATA:  Loss of appetite.  History of multiple myeloma. EXAM: CT ABDOMEN AND PELVIS WITH CONTRAST TECHNIQUE: Multidetector CT imaging of the abdomen and pelvis was performed using the standard protocol following bolus administration of intravenous contrast. CONTRAST:  137m ISOVUE-300 IOPAMIDOL (ISOVUE-300) INJECTION 61% COMPARISON:  11/20/2015 FINDINGS: Lower chest:  Unremarkable. Hepatobiliary: A 16 mm hypoattenuating subcapsular lesion medial segment left liver adjacent to falciform ligament may be related to focal fatty deposition. There is no evidence for gallstones, gallbladder wall thickening, or pericholecystic fluid. No intrahepatic or extrahepatic biliary dilation. Pancreas: No focal mass lesion. No dilatation of the main duct. No intraparenchymal cyst. No peripancreatic edema. Spleen: No splenomegaly. No focal  mass lesion. Adrenals/Urinary Tract: The No adrenal nodule or mass. Tiny hypoattenuating cortical lesions in each kidney are too small to characterize but likely cysts. Central sinus cysts are noted in the left kidney. No evidence for hydroureter. The urinary bladder appears normal for the degree of distention. Stomach/Bowel: Stomach is nondistended. No gastric wall thickening. No evidence of outlet obstruction. Duodenum is normally positioned as is the ligament of Treitz. No small bowel wall thickening. No small bowel dilatation. The terminal ileum is normal. The appendix is normal. Right colon with relatively prominent stool volume, nonspecific finding. Vascular/Lymphatic: No abdominal aortic aneurysm. No abdominal aortic atherosclerotic calcification. There is no gastrohepatic  or hepatoduodenal ligament lymphadenopathy. No intraperitoneal or retroperitoneal lymphadenopathy. No pelvic sidewall lymphadenopathy. Reproductive: Uterus surgically absent.  There is no adnexal mass. Other: No intraperitoneal free fluid. Musculoskeletal: Scattered lucent lesions throughout the skeleton likely related to the reported clinical history of multiple myeloma. Old posterior right rib fractures evident. IMPRESSION: 1. No acute findings in the abdomen or pelvis. Specifically, no findings to explain the patient's history of decreased appetite. 2. New 16 mm hypoattenuating lesion medial segment left liver is in a characteristic location for focal fatty deposition, but MRI of the abdomen without with contrast is recommended to confirm. 3. Scattered skeletal lucencies compatible with the reported clinical history of multiple myeloma. Electronically Signed   By: Misty Stanley M.D.   On: 08/11/2017 09:39        Scheduled Meds: . aspirin EC  81 mg Oral Daily  . B-complex with vitamin C  1 tablet Oral Daily  . calcium-vitamin D  1 tablet Oral BID  . chlorhexidine  15 mL Mouth Rinse BID  . dexamethasone  4 mg Oral Q breakfast  . enoxaparin (LOVENOX) injection  40 mg Subcutaneous Q24H  . famotidine  20 mg Oral BID  . feeding supplement (ENSURE ENLIVE)  237 mL Oral TID BM  . gabapentin  600 mg Oral QHS  . levofloxacin  250 mg Oral Daily  . magnesium oxide  200 mg Oral Daily  . mouth rinse  15 mL Mouth Rinse q12n4p  . potassium chloride  40 mEq Oral Daily   Continuous Infusions: . sodium chloride 75 mL/hr at 08/12/17 0500     LOS: 1 day    Rolen Conger Tanna Furry, MD [facility] Pager [phone]  If 7PM-7AM, please contact night-coverage [email or web address] Password [email or web address] 08/12/2017, 10:25 AM

## 2017-08-13 ENCOUNTER — Inpatient Hospital Stay (HOSPITAL_COMMUNITY): Payer: 59

## 2017-08-13 LAB — EPSTEIN-BARR VIRUS VCA ANTIBODY PANEL
EBV EARLY ANTIGEN AB, IGG: 138 U/mL — AB (ref 0.0–8.9)
EBV NA IgG: 377 U/mL — ABNORMAL HIGH (ref 0.0–17.9)
EBV VCA IgM: <36 U/mL (ref 0.0–35.9)

## 2017-08-13 MED ORDER — GABAPENTIN 300 MG PO CAPS: 600.0000 mg | ORAL_CAPSULE | Freq: Every day | ORAL | 0 refills | Status: DC

## 2017-08-13 MED ORDER — POTASSIUM CHLORIDE CRYS ER 20 MEQ PO TBCR: 20.0000 meq | EXTENDED_RELEASE_TABLET | Freq: Every day | ORAL | 0 refills | Status: DC

## 2017-08-13 MED ORDER — B COMPLEX-C PO TABS: 1.0000 | ORAL_TABLET | Freq: Every day | ORAL | 0 refills | Status: DC

## 2017-08-13 MED ORDER — GADOBENATE DIMEGLUMINE 529 MG/ML IV SOLN
15.0000 mL | Freq: Once | INTRAVENOUS | Status: AC | PRN
  Administered 2017-08-13: 12 mL via INTRAVENOUS

## 2017-08-13 NOTE — Discharge Summary (Addendum)
Physician Discharge Summary  Paula Johnson EPP:[id number] DOB: 08/29/1956 DOA: 08/09/2017  PCP: [name], MD  Admit date: 08/09/2017 Discharge date: 08/13/2017  Admitted From:home Disposition:home  Recommendations for Outpatient Follow-up:  1. Follow up with PCP and oncologist in 1-2 weeks 2. Please obtain BMP/CBC in one week 3. Please hold Revlimid until you see [name] in the clinic.  Home Health:no Equipment/Devices:no Discharge Condition:stable CODE STATUS:full code Diet recommendation:regular  Brief/Interim Summary: 61 year old female with history of multiple myeloma status post autologous bone marrow transplant in June 2017, recently received multiple posttransplant vaccination at [facility] on 10/30, presented with feeling sick, jittery stomach upset and decreased oral intake.  She was admitted directly from oncology clinic.  #Generalized weakness, decreased appetite and failure to thrive in adult: -Patient with multiple myeloma and undergoing chemotherapy with oncology.  CT scan of abdomen pelvis with no acute finding except new hypoattenuating lesion in the left liver likely focal fatty deposition and scattered skeletal findings compatible with history of multiple myeloma. The findings discussed with the patient today.  MRI of the abdomen was obtained which showed benign focal fatty infiltration with no evidence of hepatic neoplasm.  I reviewed the above findings with the patient and her oncologist [name]. -Vitamin B12 level, a.m. cortisol, TSH, CK level acceptable -Patient is able to tolerate diet well although she does not have great appetite.  Recommended outpatient follow-up.  Her abdominal exam is benign.  She has no nausea or vomiting.  #Hyponatremia/hypokalemia: Likely due to decreased oral intake.   Electrolytes improved.  Discharging with oral potassium with outpatient follow-up.  #Asymptomatic bacteriuria: Culture with likely contaminant.  Patient does not have  urinary symptoms.  Received 3 days of Levaquin.  #Jerky movement/jitteriness likely restless leg syndrome as per neurology: Iron stores acceptable.   -As per neurology no neurological process going on.  Patient symptoms improved.  Received Neurontin which was continued on discharge.  Reviewed this medication with the patient at bedside.  #History of multiple myeloma: Per oncology.  Revlimid on hold now.  I have discussed with the patient to hold Revlimid until she will be seen by her oncologist.  Moderate protein calorie malnutrition: Continue oral supplement, dietary referral.  Discharge Diagnoses:  Active Problems:   Restless leg syndrome   FTT (failure to thrive) in adult   Urinary tract infection without hematuria   Muscular fasciculation   Other fatigue   Abdominal pain    Discharge Instructions  Discharge Instructions    Call MD for:  difficulty breathing, headache or visual disturbances   Complete by:  As directed    Call MD for:  extreme fatigue   Complete by:  As directed    Call MD for:  hives   Complete by:  As directed    Call MD for:  persistant dizziness or light-headedness   Complete by:  As directed    Call MD for:  persistant nausea and vomiting   Complete by:  As directed    Call MD for:  severe uncontrolled pain   Complete by:  As directed    Call MD for:  temperature >100.4   Complete by:  As directed    Diet general   Complete by:  As directed    Discharge instructions   Complete by:  As directed    Please don't take Revlimid until you see [name] in the clinic.   Increase activity slowly   Complete by:  As directed  Allergies as of 08/13/2017      Reactions   Penicillins    Has patient had a PCN reaction causing immediate rash, facial/tongue/throat swelling, SOB or lightheadedness with hypotension: Y Has patient had a PCN reaction causing severe rash involving mucus membranes or skin necrosis: Y Has patient had a PCN reaction that  required hospitalization: N Has patient had a PCN reaction occurring within the last 10 years: N If all of the above answers are "NO", then may proceed with Cephalosporin use.   Ampicillin Rash      Medication List    TAKE these medications   aspirin EC 81 MG tablet Take 81 mg by mouth daily.   B-complex with vitamin C tablet Take 1 tablet daily by mouth. Start taking on:  08/14/2017   CALCIUM+D3 600-800 MG-UNIT Tabs Generic drug:  Calcium Carb-Cholecalciferol Take 2 (two) times daily by mouth.   DULCOLAX PO Take 2 tablets 2 (two) times daily by mouth.   Eszopiclone 3 MG Tabs 3 mg.   fluticasone 50 MCG/ACT nasal spray Commonly known as:  FLONASE Place 1 spray daily as needed into the nose for allergies.   gabapentin 300 MG capsule Commonly known as:  NEURONTIN Take 2 capsules (600 mg total) at bedtime by mouth.   lenalidomide 15 MG capsule Commonly known as:  REVLIMID Take 1 capsule (15 mg total) by mouth daily. Auth# [id number] What changed:    when to take this  additional instructions   LORazepam 0.5 MG tablet Commonly known as:  ATIVAN Take 1 tablet (0.5 mg total) every 6 (six) hours as needed by mouth for anxiety.   Magnesium 400 MG Caps Take 1 tablet by mouth daily.   metroNIDAZOLE 0.75 % vaginal gel Commonly known as:  METROGEL PLACE 1 APPLICATORFUL VAGINALLY AT BEDTIME TWICE A WEEK   MIRALAX PO Take 1 packet daily as needed by mouth (constipation).   potassium chloride SA 20 MEQ tablet Commonly known as:  K-DUR,KLOR-CON Take 1 tablet (20 mEq total) daily by mouth. Start taking on:  08/14/2017   traZODone 50 MG tablet Commonly known as:  DESYREL Take 1-2 tablets (50-100 mg total) at bedtime as needed by mouth for sleep.      Follow-up Information    Brunetta Genera, MD. Schedule an appointment as soon as possible for a visit in 1 week(s).   Specialties:  Hematology, Oncology Contact information: [place]  [address] [phone]        [name], MD. Schedule an appointment as soon as possible for a visit in 1 week(s).   Specialty:  Family Medicine Contact information: [address], [address] [place] [address] [phone]          Allergies  Allergen Reactions  . Penicillins     Has patient had a PCN reaction causing immediate rash, facial/tongue/throat swelling, SOB or lightheadedness with hypotension: Y Has patient had a PCN reaction causing severe rash involving mucus membranes or skin necrosis: Y Has patient had a PCN reaction that required hospitalization: N Has patient had a PCN reaction occurring within the last 10 years: N If all of the above answers are "NO", then may proceed with Cephalosporin use.   . Ampicillin Rash    Consultations: Oncology Neurology  Procedures/Studies: MRI  Subjective: Seen and examined at bedside.  Denied Headache, dizziness, nausea vomiting chest pain or shortness of breath.  Reported eating okay but still not having great appetite.  No diarrhea constipation.  Discharge Exam: Vitals:   08/12/17 2115 08/13/17 0529  BP: 136/78 132/78  Pulse: 84 88  Resp: 18 18  Temp: 98.3 F (36.8 C) 98.7 F (37.1 C)  SpO2: 99% 95%   Vitals:   08/12/17 0550 08/12/17 1333 08/12/17 2115 08/13/17 0529  BP: (!) 144/77 130/74 136/78 132/78  Pulse: 93 78 84 88  Resp: 18 18 18 18   Temp: 98.2 F (36.8 C) 98.5 F (36.9 C) 98.3 F (36.8 C) 98.7 F (37.1 C)  TempSrc: Oral Oral Oral Oral  SpO2: 98% 97% 99% 95%  Weight:      Height:        General: Pt is alert, awake, not in acute distress Cardiovascular: RRR, S1/S2 +, no rubs, no gallops Respiratory: CTA bilaterally, no wheezing, no rhonchi Abdominal: Soft, NT, ND, bowel sounds + Extremities: no edema, no cyanosis    The results of significant diagnostics from this hospitalization (including imaging, microbiology, ancillary and laboratory) are listed below for reference.      Microbiology: Recent Results (from the past 240 hour(s))  Culture, Urine     Status: Abnormal   Collection Time: 08/09/17  8:26 PM  Result Value Ref Range Status   Specimen Description URINE, CLEAN CATCH  Final   Special Requests NONE  Final   Culture MULTIPLE SPECIES PRESENT, SUGGEST RECOLLECTION (A)  Final   Report Status 08/11/2017 FINAL  Final     Labs: BNP (last 3 results) No results for input(s): BNP in the last 8760 hours. Basic Metabolic Panel: Recent Labs  Lab 08/09/17 1720 08/10/17 0527 08/11/17 0606 08/12/17 0536  NA 129* 132* 133* 133*  K 3.5 3.5 3.3* 3.6  CL 99* 104 104 105  CO2 20* 22 23 22   GLUCOSE 93 102* 124* 98  BUN 11 9 6 8   CREATININE 0.62 0.60 0.63 0.60  CALCIUM 8.5* 8.2* 8.1* 8.4*  MG 1.9  --  2.0  --   PHOS  --   --   --  2.0*   Liver Function Tests: Recent Labs  Lab 08/09/17 1720 08/11/17 0606 08/12/17 0536  AST 13* 13*  --   ALT 16 14  --   ALKPHOS 84 66  --   BILITOT 1.1 1.0  --   PROT 7.1 6.5  --   ALBUMIN 3.7 3.4* 3.3*   No results for input(s): LIPASE, AMYLASE in the last 168 hours. No results for input(s): AMMONIA in the last 168 hours. CBC: Recent Labs  Lab 08/09/17 1720 08/10/17 0527 08/11/17 0606  WBC 3.1* 3.3* 3.3*  NEUTROABS 1.5* 1.7 1.6*  HGB 11.9* 11.0* 11.2*  HCT 34.0* 31.9* 31.9*  MCV 91.4 91.9 91.1  PLT 294 279 297   Cardiac Enzymes: Recent Labs  Lab 08/10/17 0527  CKTOTAL 100   BNP: Invalid input(s): POCBNP CBG: No results for input(s): GLUCAP in the last 168 hours. D-Dimer No results for input(s): DDIMER in the last 72 hours. Hgb A1c No results for input(s): HGBA1C in the last 72 hours. Lipid Profile No results for input(s): CHOL, HDL, LDLCALC, TRIG, CHOLHDL, LDLDIRECT in the last 72 hours. Thyroid function studies No results for input(s): TSH, T4TOTAL, T3FREE, THYROIDAB in the last 72 hours.  Invalid input(s): FREET3 Anemia work up Recent Labs    08/11/17 0606  VITAMINB12 294   FERRITIN 51  TIBC 283  IRON 73   Urinalysis    Component Value Date/Time   COLORURINE YELLOW 08/09/2017 2026   APPEARANCEUR HAZY (A) 08/09/2017 2026  LABSPEC 1.023 08/09/2017 2026   LABSPEC 1.020 11/05/2015 0902   PHURINE 5.0 08/09/2017 2026   GLUCOSEU NEGATIVE 08/09/2017 2026   GLUCOSEU Negative 11/05/2015 0902   HGBUR MODERATE (A) 08/09/2017 2026   BILIRUBINUR NEGATIVE 08/09/2017 2026   BILIRUBINUR Negative 11/05/2015 0902   KETONESUR 80 (A) 08/09/2017 2026   PROTEINUR NEGATIVE 08/09/2017 2026   UROBILINOGEN 0.2 11/05/2015 0902   NITRITE NEGATIVE 08/09/2017 2026   LEUKOCYTESUR SMALL (A) 08/09/2017 2026   LEUKOCYTESUR Negative 11/05/2015 0902   Sepsis Labs Invalid input(s): PROCALCITONIN,  WBC,  LACTICIDVEN Microbiology Recent Results (from the past 240 hour(s))  Culture, Urine     Status: Abnormal   Collection Time: 08/09/17  8:26 PM  Result Value Ref Range Status   Specimen Description URINE, CLEAN CATCH  Final   Special Requests NONE  Final   Culture MULTIPLE SPECIES PRESENT, SUGGEST RECOLLECTION (A)  Final   Report Status 08/11/2017 FINAL  Final     Time coordinating discharge: 26 minutes  SIGNED:   Rosita Fire, MD  [facility] 08/13/2017, 1:01 PM  If 7PM-7AM, please contact night-coverage [email or web address] Password [email or web address]

## 2017-08-13 NOTE — Progress Notes (Signed)
Date: August 13, 2017 Chart review for discharge needs:  None found for case management. Patient has no questions concerning post hospital care.

## 2017-08-13 NOTE — Progress Notes (Signed)
Patient being discharged home today. Discharge instructions were gone over with patient and family. IV site removed. Prescriptions given to patient. Awaiting for ride home.

## 2017-08-13 NOTE — Progress Notes (Signed)
Date: August 13, 2017 Lavance Beazer, BSN, RN3, CCM  [phone] Chart and notes review for patient progress and needs. Will follow for case management and discharge needs. Next review date: [id number] 

## 2017-08-15 ENCOUNTER — Telehealth: Payer: Self-pay | Admitting: Obstetrics and Gynecology

## 2017-08-15 ENCOUNTER — Ambulatory Visit: Payer: 59 | Admitting: Obstetrics and Gynecology

## 2017-08-15 VITALS — BP 102/60 | HR 60 | Wt 127.8 lb

## 2017-08-15 DIAGNOSIS — N765 Ulceration of vagina: Secondary | ICD-10-CM

## 2017-08-15 DIAGNOSIS — N819 Female genital prolapse, unspecified: Secondary | ICD-10-CM

## 2017-08-15 NOTE — Telephone Encounter (Signed)
Spoke with patient. Patient request OV today with Dr. Quincy Simmonds.  Reports discomfort, bleeding and thick, yellow/gray vaginal d/c. No odor. Reports pessary is in place, has had trouble with this in the past. Has removed pessary for a few days and replaced, symptoms did not resolve.   Denies fever, urinary complaints, N/V.   OV scheduled for today at 3:30pm with Dr. Quincy Simmonds. Patient verbalizes understanding and is agreeable.   Routing to provider for final review. Patient is agreeable to disposition. Will close encounter.

## 2017-08-15 NOTE — Telephone Encounter (Signed)
Patient is having problems with her pessary and would like to be seen today if possible.

## 2017-08-15 NOTE — Progress Notes (Signed)
GYNECOLOGY  VISIT   HPI: 61 y.o.   Married  Caucasian  female   G2P2002 with No LMP recorded. Patient has had a hysterectomy.   here for bloody vaginal discharge with pessary inserted.    Takes the pessary out a couple of times per week.   Took the pessary out ofr 5 days and the bleeding stopped.  Replacing it started the bleeding again.   Using vaginal estrogen cream 1/2 gram pv twice a week.  Just out of hospital for jittery stomach.  Inability to sleep. Will see her PCP next week in follow up to hospitalization. Symptoms started after receiving a recent vaccine.   GYNECOLOGIC HISTORY: No LMP recorded. Patient has had a hysterectomy. Contraception: Hyserectomy Menopausal hormone therapy:  none Last mammogram:   06/23/16 BIRADS 1 negative/density b Last pap smear:  2015 Neg        OB History    Gravida Para Term Preterm AB Living   _0 SAB TAB Ectopic Multiple Live Births                     Patient Active Problem List   Diagnosis Date Noted  . Urinary tract infection without hematuria   . Muscular fasciculation   . Other fatigue   . Abdominal pain   . FTT (failure to thrive) in adult 08/09/2017  . Counseling regarding advanced care planning and goals of care 08/03/2017  . Gastroesophageal reflux disease without esophagitis 01/17/2017  . S/P autologous bone marrow transplantation ([place]) 03/29/2016  . Near syncope 01/04/2016  . Peripheral edema 12/19/2015  . Hematuria 11/08/2015  . Rash 11/01/2015  . Multiple myeloma ([place]) 10/19/2015  . Metastatic multiple myeloma to bone ([place]) 10/19/2015  . Plasma cell dyscrasia 10/05/2015  . Anemia 09/29/2015  . Hypergammaglobulinemia 09/29/2015  . HYPOXEMIA 12/16/2007  . HYPERLIPIDEMIA 12/04/2007  . DYSPNEA 12/04/2007  . MALIGNANT NEOPLASM OF THYMUS 12/03/2007  . OBSTRUCTIVE SLEEP APNEA 12/03/2007  . Restless leg syndrome 12/03/2007    Past Medical History:  Diagnosis Date  . Abnormal Pap smear of vagina  03/22/2017   ASCUS pap and negative HR HPV.  Patient is on immunosuppressive medication.   . Cancer ([place]) 1999   squamous cell carcinoma of thymus  . Fibroid    reason for Hysterectomy  . Multiple myeloma ([place]) 08/2015  . Pain aggravated by activities of daily living    states she pulled something at right sternum in dec 2016  . PONV (postoperative nausea and vomiting)    thinks morphine caused nausea  . Prediabetes   . Urinary incontinence     Past Surgical History:  Procedure Laterality Date  . ABDOMINAL HYSTERECTOMY  1997   TAH--ovaries remain--Dr. Newton Pigg  . THYMECTOMY  1999    Current Outpatient Medications  Medication Sig Dispense Refill  . aspirin EC 81 MG tablet Take 81 mg by mouth daily.    . B Complex-C (B-COMPLEX WITH VITAMIN C) tablet Take 1 tablet daily by mouth. 30 tablet 0  . Bisacodyl (DULCOLAX PO) Take 2 tablets 2 (two) times daily by mouth.     . Calcium Carb-Cholecalciferol (CALCIUM+D3) 600-800 MG-UNIT TABS Take 2 (two) times daily by mouth.    . fluticasone (FLONASE) 50 MCG/ACT nasal spray Place 1 spray daily as needed into the nose for allergies.     Marland Kitchen gabapentin (NEURONTIN) 300 MG capsule Take 2 capsules (600 mg total) at bedtime by mouth. [place]  capsule 0  . lenalidomide (REVLIMID) 15 MG capsule Take 1 capsule (15 mg total) by mouth daily. Auth# [id number] (Patient taking differently: Take 15 mg as directed by mouth. Auth# [id number]. Take 1 capsule daily for 21 Days, Then Take 7 Days Off) 21 capsule 0  . LORazepam (ATIVAN) 0.5 MG tablet Take 1 tablet (0.5 mg total) every 6 (six) hours as needed by mouth for anxiety. 30 tablet 0  . Magnesium 400 MG CAPS Take 1 tablet by mouth daily.    . Polyethylene Glycol 3350 (MIRALAX PO) Take 1 packet daily as needed by mouth (constipation).     . potassium chloride SA (K-DUR,KLOR-CON) 20 MEQ tablet Take 1 tablet (20 mEq total) daily by mouth. 30 tablet 0   No current facility-administered medications for this visit.       ALLERGIES: Penicillins and Ampicillin  Family History  Problem Relation Age of Onset  . COPD Father        dec age 43/s-smoked  . Diabetes Brother   . Other Brother        committed suicide age 36  . Hyperlipidemia Mother   . Diabetes Maternal Grandmother     Social History   Socioeconomic History  . Marital status: Married    Spouse name: Not on file  . Number of children: Not on file  . Years of education: Not on file  . Highest education level: Not on file  Social Needs  . Financial resource strain: Not on file  . Food insecurity - worry: Not on file  . Food insecurity - inability: Not on file  . Transportation needs - medical: Not on file  . Transportation needs - non-medical: Not on file  Occupational History  . Not on file  Tobacco Use  . Smoking status: Never Smoker  . Smokeless tobacco: Never Used  Substance and Sexual Activity  . Alcohol use: No    Alcohol/week: 0.0 oz  . Drug use: No  . Sexual activity: No    Partners: Male    Birth control/protection: Surgical    Comment: TAH--ovaries remain  Other Topics Concern  . Not on file  Social History Narrative  . Not on file    ROS:  Pertinent items are noted in HPI.  PHYSICAL EXAMINATION:    BP 102/60 (BP Location: Right Arm, Patient Position: Sitting, Cuff Size: Normal)   Pulse 60   Wt 127 lb 12.8 oz (58 kg)   BMI 19.43 kg/m     General appearance: alert, cooperative and appears stated age.  Looks worried today.   Pelvic: External genitalia:  no lesions              Urethra:  normal appearing urethra with no masses, tenderness or lesions              Bartholins and Skenes: normal                 Vagina:  Posterior vaginal vault and sides of the vagina with marked ulceration and bleeding.               Cervix: absent.                Bimanual Exam:  Uterus:  Absent.               Adnexa: no mass, fullness, tenderness            Pessary removed, cleansed, and given to the patient in a biobag.  The pessary is stained brownish red color.   Chaperone was present for exam.  ASSESSMENT  Status post hysterectomy. Vaginal vault prolapase. Vaginal ulceration from pessary use.  Multiple myeloma.  Anxiety.  PLAN  I recommend she keep the pessary out for about 7 days. She will use vaginal estrogen cream 1/2 gram nightly until her next visit.   When she returns for a recheck, we will fit for a smaller pessary, knowing that she may need to remove this to have a BM as a smaller pessary is more likely to come out with straining.  She will need to update her mammogram, and I will discuss this at her next visit. I encouraged her to discuss her anxiety with her upcoming PCP visit.  She has Ativan, but she may benefit from an SSRI.  An After Visit Summary was printed and given to the patient.  ___15___ minutes face to face time of which over 50% was spent in counseling.

## 2017-08-15 NOTE — Patient Instructions (Signed)
Use your vaginal estrogen cream 1/2 gram internally at bedtime for the next 9 days.

## 2017-08-17 ENCOUNTER — Encounter: Payer: Self-pay | Admitting: Obstetrics and Gynecology

## 2017-08-17 ENCOUNTER — Telehealth: Payer: Self-pay | Admitting: Hematology

## 2017-08-17 NOTE — Telephone Encounter (Signed)
Per 11/15- no los at check out

## 2017-08-20 ENCOUNTER — Encounter: Payer: Self-pay | Admitting: *Deleted

## 2017-08-22 ENCOUNTER — Other Ambulatory Visit: Payer: Self-pay | Admitting: *Deleted

## 2017-08-22 ENCOUNTER — Other Ambulatory Visit (HOSPITAL_BASED_OUTPATIENT_CLINIC_OR_DEPARTMENT_OTHER): Payer: 59

## 2017-08-22 ENCOUNTER — Ambulatory Visit (HOSPITAL_BASED_OUTPATIENT_CLINIC_OR_DEPARTMENT_OTHER): Payer: 59 | Admitting: Hematology

## 2017-08-22 ENCOUNTER — Telehealth: Payer: Self-pay | Admitting: Hematology

## 2017-08-22 VITALS — BP 144/85 | HR 105 | Temp 98.5°F | Resp 18 | Ht 68.0 in | Wt 126.5 lb

## 2017-08-22 DIAGNOSIS — F329 Major depressive disorder, single episode, unspecified: Secondary | ICD-10-CM

## 2017-08-22 DIAGNOSIS — R630 Anorexia: Secondary | ICD-10-CM

## 2017-08-22 DIAGNOSIS — R627 Adult failure to thrive: Secondary | ICD-10-CM | POA: Diagnosis not present

## 2017-08-22 DIAGNOSIS — R450 Nervousness: Secondary | ICD-10-CM

## 2017-08-22 DIAGNOSIS — C9001 Multiple myeloma in remission: Secondary | ICD-10-CM

## 2017-08-22 DIAGNOSIS — F32A Depression, unspecified: Secondary | ICD-10-CM

## 2017-08-22 DIAGNOSIS — N390 Urinary tract infection, site not specified: Secondary | ICD-10-CM

## 2017-08-22 DIAGNOSIS — R5383 Other fatigue: Secondary | ICD-10-CM | POA: Diagnosis not present

## 2017-08-22 DIAGNOSIS — F419 Anxiety disorder, unspecified: Secondary | ICD-10-CM

## 2017-08-22 LAB — CBC WITH DIFFERENTIAL/PLATELET
BASO%: 0.9 % (ref 0.0–2.0)
Basophils Absolute: 0 10*3/uL (ref 0.0–0.1)
EOS%: 1.7 % (ref 0.0–7.0)
Eosinophils Absolute: 0.1 10*3/uL (ref 0.0–0.5)
HCT: 38.4 % (ref 34.8–46.6)
HGB: 12.8 g/dL (ref 11.6–15.9)
LYMPH%: 19.1 % (ref 14.0–49.7)
MCH: 32 pg (ref 25.1–34.0)
MCHC: 33.4 g/dL (ref 31.5–36.0)
MCV: 95.8 fL (ref 79.5–101.0)
MONO#: 0.4 10*3/uL (ref 0.1–0.9)
MONO%: 8 % (ref 0.0–14.0)
NEUT#: 3.3 10*3/uL (ref 1.5–6.5)
NEUT%: 70.3 % (ref 38.4–76.8)
Platelets: 324 10*3/uL (ref 145–400)
RBC: 4.01 10*6/uL (ref 3.70–5.45)
RDW: 15.1 % — ABNORMAL HIGH (ref 11.2–14.5)
WBC: 4.7 10*3/uL (ref 3.9–10.3)
lymph#: 0.9 10*3/uL (ref 0.9–3.3)

## 2017-08-22 LAB — COMPREHENSIVE METABOLIC PANEL
ALT: 12 U/L (ref 0–55)
AST: 12 U/L (ref 5–34)
Albumin: 4.1 g/dL (ref 3.5–5.0)
Alkaline Phosphatase: 80 U/L (ref 40–150)
Anion Gap: 10 mEq/L (ref 3–11)
BILIRUBIN TOTAL: 0.56 mg/dL (ref 0.20–1.20)
BUN: 21.9 mg/dL (ref 7.0–26.0)
CHLORIDE: 102 meq/L (ref 98–109)
CO2: 25 meq/L (ref 22–29)
Calcium: 10.2 mg/dL (ref 8.4–10.4)
Creatinine: 0.8 mg/dL (ref 0.6–1.1)
GLUCOSE: 137 mg/dL (ref 70–140)
Potassium: 4.2 mEq/L (ref 3.5–5.1)
SODIUM: 136 meq/L (ref 136–145)
TOTAL PROTEIN: 7.4 g/dL (ref 6.4–8.3)

## 2017-08-22 LAB — MAGNESIUM: Magnesium: 2.6 mg/dl — ABNORMAL HIGH (ref 1.5–2.5)

## 2017-08-22 MED ORDER — CITALOPRAM HYDROBROMIDE 10 MG PO TABS: 10.0000 mg | ORAL_TABLET | Freq: Every day | ORAL | 1 refills | Status: DC

## 2017-08-22 NOTE — Progress Notes (Signed)
HEMATOLOGY/ONCOLOGY INPATIENT PROGRESS NOTE  Date of Service: 08/22/2017  Inpatient Attending: .Brunetta Genera, MD  SUBJECTIVE  Paula Johnson is here for fu of her multiple myeloma in remission. Of note since her last visit, she was admitted for FTT, UTI and poor po intake and jitteriness and abdominal pain on 08/09/2017.  She had CT abd and MRI abd with no ovet pathology. Subsequently was seen by Dr Collene Mares and had EGD which did not show any overt pathology - mild gastritis. She notes that her appetite is still poor but has been able to drink 4-6 ensures daily. Still feeling a jittery. Has been started on neurontin for possible RLS.  She is accompanied by her husband. Labs are stable today. She reports having been diagnosed with sleep apnea and used a CPAP in the past. She reports being frustrated and depressed because of unclear reason of her symptoms but appreciative of our efforts to figure this out. SHe has a f/u with Dr Moreen Fowler and shall be getting him input as well.  On ROS, pt reports loss of appetite, chills, insomnia, jitteriness, fatigue and denies difficulty swallowing, fever and any other accompanying symptoms.  OBJECTIVE:  Flat affect and appears significantly fatigued.  PHYSICAL EXAMINATION: . Vitals:   08/22/17 1508  BP: (!) 144/85  Pulse: (!) 105  Resp: 18  Temp: 98.5 F (36.9 C)  TempSrc: Oral  SpO2: 99%  Weight: 126 lb 8 oz (57.4 kg)  Height: _0  (1.727 m)   Filed Weights   08/22/17 1508  Weight: 126 lb 8 oz (57.4 kg)   .Body mass index is 19.23 kg/m.  GENERAL:alert, in no acute distress and comfortable SKIN: skin color, texture, turgor are normal, no rashes or significant lesions EYES: normal, conjunctiva are pink and non-injected, sclera clear OROPHARYNX:no exudate, no erythema and lips, buccal mucosa, and tongue normal  NECK: supple, no JVD, thyroid normal size, non-tender, without nodularity LYMPH:  no palpable lymphadenopathy in the  cervical, axillary or inguinal LUNGS: clear to auscultation with normal respiratory effort HEART: regular rate & rhythm,  no murmurs and no lower extremity edema ABDOMEN: abdomen soft, non-tender, normoactive bowel sounds  Musculoskeletal: no cyanosis of digits and no clubbing  PSYCH: alert & oriented x 3 with fluent speech NEURO: no focal motor/sensory deficits  MEDICAL HISTORY:   Past Medical History:  Diagnosis Date  . Abnormal Pap smear of vagina 03/22/2017   ASCUS pap and negative HR HPV.  Patient is on immunosuppressive medication.   . Cancer ([place]) 1999   squamous cell carcinoma of thymus  . Fibroid    reason for Hysterectomy  . Multiple myeloma ([place]) 08/2015  . Pain aggravated by activities of daily living    states she pulled something at right sternum in dec 2016  . PONV (postoperative nausea and vomiting)    thinks morphine caused nausea  . Prediabetes   . Urinary incontinence     SURGICAL HISTORY: Past Surgical History:  Procedure Laterality Date  . ABDOMINAL HYSTERECTOMY  1997   TAH--ovaries remain--Dr. Newton Pigg  . THYMECTOMY  1999    SOCIAL HISTORY: Social History   Socioeconomic History  . Marital status: Married    Spouse name: Not on file  . Number of children: Not on file  . Years of education: Not on file  . Highest education level: Not on file  Social Needs  . Financial resource strain: Not on file  . Food insecurity - worry: Not on file  . Food  insecurity - inability: Not on file  . Transportation needs - medical: Not on file  . Transportation needs - non-medical: Not on file  Occupational History  . Not on file  Tobacco Use  . Smoking status: Never Smoker  . Smokeless tobacco: Never Used  Substance and Sexual Activity  . Alcohol use: No    Alcohol/week: 0.0 oz  . Drug use: No  . Sexual activity: No    Partners: Male    Birth control/protection: Surgical    Comment: TAH--ovaries remain  Other Topics Concern  . Not on file    Social History Narrative  . Not on file    FAMILY HISTORY: Family History  Problem Relation Age of Onset  . COPD Father        dec age 43/s-smoked  . Diabetes Brother   . Other Brother        committed suicide age 34  . Hyperlipidemia Mother   . Diabetes Maternal Grandmother     ALLERGIES:  is allergic to penicillins and ampicillin.  MEDICATIONS:  . Current Outpatient Medications:  .  aspirin EC 81 MG tablet, Take 81 mg by mouth daily., Disp: , Rfl:  .  B Complex-C (B-COMPLEX WITH VITAMIN C) tablet, Take 1 tablet daily by mouth., Disp: 30 tablet, Rfl: 0 .  Bisacodyl (DULCOLAX PO), Take 2 tablets 2 (two) times daily by mouth. , Disp: , Rfl:  .  Calcium Carb-Cholecalciferol (CALCIUM+D3) 600-800 MG-UNIT TABS, Take 2 (two) times daily by mouth., Disp: , Rfl:  .  citalopram (CELEXA) 10 MG tablet, Take 1 tablet (10 mg total) by mouth daily., Disp: 30 tablet, Rfl: 1 .  fluticasone (FLONASE) 50 MCG/ACT nasal spray, Place 1 spray daily as needed into the nose for allergies. , Disp: , Rfl:  .  gabapentin (NEURONTIN) 300 MG capsule, Take 2 capsules (600 mg total) at bedtime by mouth., Disp: 30 capsule, Rfl: 0 .  lenalidomide (REVLIMID) 15 MG capsule, Take 1 capsule (15 mg total) by mouth daily. Auth# [id number] (Patient taking differently: Take 15 mg as directed by mouth. Auth# [id number]. Take 1 capsule daily for 21 Days, Then Take 7 Days Off), Disp: 21 capsule, Rfl: 0 .  LORazepam (ATIVAN) 0.5 MG tablet, Take 1 tablet (0.5 mg total) every 6 (six) hours as needed by mouth for anxiety., Disp: 30 tablet, Rfl: 0 .  Polyethylene Glycol 3350 (MIRALAX PO), Take 1 packet daily as needed by mouth (constipation). , Disp: , Rfl:    REVIEW OF SYSTEMS:    10 Point review of Systems was done is negative except as noted above.   LABORATORY DATA:  I have reviewed the data as listed  . CBC Latest Ref Rng & Units 08/22/2017 08/11/2017 08/10/2017  WBC 3.9 - 10.3 10e3/uL 4.7 3.3(L) 3.3(L)  Hemoglobin 11.6  - 15.9 g/dL 12.8 11.2(L) 11.0(L)  Hematocrit 34.8 - 46.6 % 38.4 31.9(L) 31.9(L)  Platelets 145 - 400 10e3/uL 324 297 279    . CMP Latest Ref Rng & Units 08/22/2017 08/12/2017 08/11/2017  Glucose 70 - 140 mg/dl 137 98 124(H)  BUN 7.0 - 26.0 mg/dL 21._0 Creatinine 0.6 - 1.1 mg/dL 0.8 0.60 0.63  Sodium 136 - 145 mEq/L 136 133(L) 133(L)  Potassium 3.5 - 5.1 mEq/L 4.2 3.6 3.3(L)  Chloride 101 - 111 mmol/L - 105 104  CO2 22 - 29 mEq/L _1 Calcium 8.4 - 10.4 mg/dL 10.2 8.4(L) 8.1(L)  Total Protein 6.4 - 8.3 g/dL 7.4 -  6.5  Total Bilirubin 0.20 - 1.20 mg/dL 0.56 - 1.0  Alkaline Phos 40 - 150 U/L 80 - 66  AST 5 - 34 U/L 12 - 13(L)  ALT 0 - 55 U/L 12 - 14     RADIOGRAPHIC STUDIES: I have personally reviewed the radiological images as listed and agreed with the findings in the report. Dg Chest 2 View  Result Date: 08/10/2017 CLINICAL DATA:  Weakness, fever EXAM: CHEST  2 VIEW COMPARISON:  09/29/2015 FINDINGS: Prior median sternotomy. Heart is normal size. Lungs are clear. No effusions. No acute bony abnormality. IMPRESSION: No active cardiopulmonary disease. Electronically Signed   By: Rolm Baptise M.D.   On: 08/10/2017 11:00   Mr Abdomen W Wo Contrast  Result Date: 08/13/2017 CLINICAL DATA:  New left hepatic lobe lesion on recent CT. Undergoing chemotherapy for multiple myeloma. Previous bone marrow transplant. EXAM: MRI ABDOMEN WITHOUT AND WITH CONTRAST TECHNIQUE: Multiplanar multisequence MR imaging of the abdomen was performed both before and after the administration of intravenous contrast. CONTRAST:  72m MULTIHANCE GADOBENATE DIMEGLUMINE 529 MG/ML IV SOLN COMPARISON:  CT on 08/10/2017 FINDINGS: Lower chest: No acute findings. Hepatobiliary: No hepatic masses identified. A focal area of fatty infiltration is seen on chemical shift imaging in the left lobe adjacent to the falciform ligament, corresponding with the lesion seen on recent CT. Gallbladder is unremarkable. Pancreas:   No mass or inflammatory changes. Spleen:  Within normal limits in size and appearance. Adrenals/Urinary Tract: No masses identified. No evidence of hydronephrosis. Stomach/Bowel: Visualized portions within the abdomen are unremarkable. Vascular/Lymphatic: No pathologically enlarged lymph nodes identified. No abdominal aortic aneurysm. Other:  None. Musculoskeletal:  No suspicious bone lesions identified. IMPRESSION: Benign focal fatty infiltration in left hepatic lobe, corresponding with lesion seen on recent CT. No evidence of hepatic neoplasm or other significant abnormality. Electronically Signed   By: JEarle GellM.D.   On: 08/13/2017 12:16   Ct Abdomen Pelvis W Contrast  Result Date: 08/11/2017 CLINICAL DATA:  Loss of appetite.  History of multiple myeloma. EXAM: CT ABDOMEN AND PELVIS WITH CONTRAST TECHNIQUE: Multidetector CT imaging of the abdomen and pelvis was performed using the standard protocol following bolus administration of intravenous contrast. CONTRAST:  1044mISOVUE-300 IOPAMIDOL (ISOVUE-300) INJECTION 61% COMPARISON:  11/20/2015 FINDINGS: Lower chest:  Unremarkable. Hepatobiliary: A 16 mm hypoattenuating subcapsular lesion medial segment left liver adjacent to falciform ligament may be related to focal fatty deposition. There is no evidence for gallstones, gallbladder wall thickening, or pericholecystic fluid. No intrahepatic or extrahepatic biliary dilation. Pancreas: No focal mass lesion. No dilatation of the main duct. No intraparenchymal cyst. No peripancreatic edema. Spleen: No splenomegaly. No focal mass lesion. Adrenals/Urinary Tract: The No adrenal nodule or mass. Tiny hypoattenuating cortical lesions in each kidney are too small to characterize but likely cysts. Central sinus cysts are noted in the left kidney. No evidence for hydroureter. The urinary bladder appears normal for the degree of distention. Stomach/Bowel: Stomach is nondistended. No gastric wall thickening. No evidence  of outlet obstruction. Duodenum is normally positioned as is the ligament of Treitz. No small bowel wall thickening. No small bowel dilatation. The terminal ileum is normal. The appendix is normal. Right colon with relatively prominent stool volume, nonspecific finding. Vascular/Lymphatic: No abdominal aortic aneurysm. No abdominal aortic atherosclerotic calcification. There is no gastrohepatic or hepatoduodenal ligament lymphadenopathy. No intraperitoneal or retroperitoneal lymphadenopathy. No pelvic sidewall lymphadenopathy. Reproductive: Uterus surgically absent.  There is no adnexal mass. Other: No intraperitoneal free fluid. Musculoskeletal: Scattered lucent  lesions throughout the skeleton likely related to the reported clinical history of multiple myeloma. Old posterior right rib fractures evident. IMPRESSION: 1. No acute findings in the abdomen or pelvis. Specifically, no findings to explain the patient's history of decreased appetite. 2. New 16 mm hypoattenuating lesion medial segment left liver is in a characteristic location for focal fatty deposition, but MRI of the abdomen without with contrast is recommended to confirm. 3. Scattered skeletal lucencies compatible with the reported clinical history of multiple myeloma. Electronically Signed   By: Misty Stanley M.D.   On: 08/11/2017 09:39    ASSESSMENT & PLAN:   61 y.o. caucasian female with  1. H/o Multiple myeloma - currently in remission.  IgA lambda R-ISS stage II multiple myeloma with innumerable lytic lesions in the calvarium, lesion in T7 vertebra and multiple lesions in the pelvis.  No significant bone pain at this time. Diagnosed in January 2017.  Current treatment  -Revlimid maintenance 71m po daily (on hold given her symptoms)  Previous Treatment  Status post Vd x 1 cycle VRd x 6 cycles  HD Melphalan 2079mm2 on 03/22/2016 Autologous HSCT on 03/23/2016 with Dr [facility] 6.4  x10^6/kg)  PLAN -currently holding Revlimid until patient feels better. -At this time she has been off the Revlimid for nearly a week.  We will continue to keep off Revlimid at this time.  2.  New onset progressive persistent fatigue for the last 2 weeks.  Associated with generalized jitteriness ?muscle fasiculations. patient associates this with having recent posttransplant vaccination about 2 weeks ago but at this time does not clearly appear to be related. No HTN  No rashes.  No focal neurological deficits. Evaluated by neurology as inpatient and EMG/NCS not recommended. Has recently had what appears to be a viral URI. TSH levels within normal limits. CK levels within normal limits.  3.  Poor appetite with early satiety .  Poorly defined middle to upper abdominal discomfort. Imaging and EGD --unrevealing other than mild gastritis  4.  Mild anemia.  Patient noted to have some vaginal bleeding. Resolved. We will need to monitor.  5.  Mild UTI.  No overt symptoms.  Some lower abdominal discomfort.  Urine cultures pending. -Patient started on levofloxacin for UTI. Plan -Patient at this time appears fatigued and has poor oral intake (though overall a little better than when hospitalized) - Has received IV fluids and electrolyte replacement in the hospital --referral to nutritionist BaErnestene Kiel-referred to PT for rehab -Recommend sleep apnea evaluation per PCP. - anti-anxiety SSRI Celexa for anxiety/depression today and will see her back to check her tolerance of this -recommend fu appointment with her counselor  -f/u with PCP to determine if there are any other thoughts to explain patients symptoms. ?stress reaction. -hold Revlimid at this time.  Cancer center rehab referral BaErnestene Kiel for nutritional optimization F/u with counselor KeKayleen Memosn 1 week  RTC with Dr KaIrene Limbon 3 weeks with labs    GaSullivan LoneD MSGileadAHIVMS SCEndoscopy Center Of Chula VistaTOhsu Hospital And Clinicsematology/Oncology Physician CoAud[place](Office):       [phone] cell):  [phone]):           [phone]/28/2018 3:53 PM  This document serves as a record of services personally performed by GaSullivan LoneMD. It was created on his behalf by LaAlean Rinnea trained medical scribe. The creation of this record is based on the scribe's personal observations and  the provider's statements to them.   .I have reviewed the above documentation for accuracy and completeness, and I agree with the above. Brunetta Genera MD Paula

## 2017-08-22 NOTE — Telephone Encounter (Signed)
Spoke with patient regarding appts added per 11/28 sch msg.  °

## 2017-08-22 NOTE — Patient Instructions (Signed)
Thank you for choosing [facility] to provide your oncology and hematology care.  To afford each patient quality time with our providers, please arrive 30 minutes before your scheduled appointment time.  If you arrive late for your appointment, you may be asked to reschedule.  We strive to give you quality time with our providers, and arriving late affects you and other patients whose appointments are after yours.   If you are a no show for multiple scheduled visits, you may be dismissed from the clinic at the providers discretion.    Again, thank you for choosing [facility], our hope is that these requests will decrease the amount of time that you wait before being seen by our physicians.  ______________________________________________________________________  Should you have questions after your visit to the [facility], please contact our office at [phone] between the hours of 8:30 and 4:30 p.m.    Voicemails left after 4:30p.m will not be returned until the following business day.    For prescription refill requests, please have your pharmacy contact us directly.  Please also try to allow 48 hours for prescription requests.    Please contact the scheduling department for questions regarding scheduling.  For scheduling of procedures such as PET scans, CT scans, MRI, Ultrasound, etc please contact central scheduling at [phone].    Resources For Cancer Patients and Caregivers:   Oncolink.org:  A wonderful resource for patients and healthcare providers for information regarding your disease, ways to tract your treatment, what to expect, etc.     American Cancer Society:  800-227-2345  Can help patients locate various types of support and financial assistance  Cancer Care: 1-800-813-HOPE (4673) Provides financial assistance, online support groups, medication/co-pay assistance.    [place] DSS:  336-641-3447 Where to apply for food  stamps, Medicaid, and utility assistance  Medicare Rights Center: 800-333-4114 Helps people with Medicare understand their rights and benefits, navigate the Medicare system, and secure the quality healthcare they deserve  SCAT: 336-333-6589 [employer]'s shared-ride transportation service for eligible riders who have a disability that prevents them from riding the fixed route bus.    For additional information on assistance programs please contact our social worker:   Grier Hock/Abigail Elmore:  [phone]            

## 2017-08-23 ENCOUNTER — Telehealth: Payer: Self-pay | Admitting: Hematology

## 2017-08-23 LAB — PHOSPHORUS: PHOSPHORUS: 4.7 mg/dL — AB (ref 2.5–4.5)

## 2017-08-23 NOTE — Telephone Encounter (Signed)
Scheduled appt per 11/28 los - sent reminder letter in the mail with appt date and time.

## 2017-08-24 ENCOUNTER — Telehealth: Payer: Self-pay

## 2017-08-24 ENCOUNTER — Telehealth: Payer: Self-pay | Admitting: *Deleted

## 2017-08-24 NOTE — Telephone Encounter (Signed)
Received triage note that pt's PCP suggested remeron over Celexa which was previously prescribed by [name].  Pt requesting remeron rx.  SW pt's husband, [name] out of office until Wednesday 12/7.  Suggested that if patient does not want to wait, could request rx from PCP.  Per husband, ok to wait until Wednesday. Will forward message to Dr. Marcelle Smiling, RN.  Husband thankful for call.

## 2017-08-24 NOTE — Telephone Encounter (Signed)
Pt talked with her PCP and he prefers the pt take remeron in place of the celexa Dr Irene Limbo put her on. She states Dr Irene Limbo had mentioned remeron as a possiblity. She is asking that remeron be call in to [facility] on [address].   Pt is asking to let pt know if she can get it filled today or if she has to wait.

## 2017-08-29 ENCOUNTER — Telehealth: Payer: Self-pay

## 2017-08-29 ENCOUNTER — Telehealth: Payer: Self-pay | Admitting: Hematology

## 2017-08-29 ENCOUNTER — Other Ambulatory Visit: Payer: Self-pay | Admitting: Hematology

## 2017-08-29 ENCOUNTER — Other Ambulatory Visit: Payer: Self-pay

## 2017-08-29 DIAGNOSIS — C9000 Multiple myeloma not having achieved remission: Secondary | ICD-10-CM

## 2017-08-29 MED ORDER — LENALIDOMIDE 15 MG PO CAPS: 15.0000 mg | ORAL_CAPSULE | ORAL | 0 refills | Status: DC

## 2017-08-29 NOTE — Telephone Encounter (Signed)
Pt requested change of appointments, which were completed by [name] (scheduler). Pt not feeling better. [name] called to hold revlimid prescription at this time. PCP recommended remeron to pt instead of celexa. [name] would like for the pt to continue on celexa at this time and symptoms will be re-evaluated at the next doctor visit. Pt verbalized understanding.

## 2017-08-29 NOTE — Telephone Encounter (Signed)
Patient called in to reschedule  °

## 2017-08-31 ENCOUNTER — Other Ambulatory Visit: Payer: Self-pay

## 2017-08-31 ENCOUNTER — Ambulatory Visit: Payer: 59 | Attending: Hematology | Admitting: Physical Therapy

## 2017-08-31 DIAGNOSIS — R293 Abnormal posture: Secondary | ICD-10-CM

## 2017-08-31 DIAGNOSIS — M6281 Muscle weakness (generalized): Secondary | ICD-10-CM | POA: Insufficient documentation

## 2017-08-31 NOTE — Therapy (Signed)
[place], [place], [address] Phone: [phone]   Fax:  [phone]  Physical Therapy Evaluation  Patient Details  Name: Paula Johnson MRN: [id number] Date of Birth: 10-28-1955 Referring Provider: Dr Irene Limbo    Encounter Date: 08/31/2017  PT End of Session - 08/31/17 1135    Visit Number  1    Number of Visits  9    Date for PT Re-Evaluation  10/01/17    PT Start Time  0800    PT Stop Time  0845    PT Time Calculation (min)  45 min    Activity Tolerance  Patient tolerated treatment well    Behavior During Therapy  [facility] for tasks assessed/performed       Past Medical History:  Diagnosis Date  . Abnormal Pap smear of vagina 03/22/2017   ASCUS pap and negative HR HPV.  Patient is on immunosuppressive medication.   . Cancer ([place]) 1999   squamous cell carcinoma of thymus  . Fibroid    reason for Hysterectomy  . Multiple myeloma ([place]) 08/2015  . Pain aggravated by activities of daily living    states she pulled something at right sternum in dec 2016  . PONV (postoperative nausea and vomiting)    thinks morphine caused nausea  . Prediabetes   . Urinary incontinence     Past Surgical History:  Procedure Laterality Date  . ABDOMINAL HYSTERECTOMY  1997   TAH--ovaries remain--Dr. Newton Pigg  . THYMECTOMY  1999    There were no vitals filed for this visit.   Subjective Assessment - 08/31/17 0808    Subjective  Pt concerned that the doctors have not been able to find out whats wrong with her.  She is upset about her symptoms  She has an exercise bike at home and knows alot about community exercise options, used to [employer] dance with her husband 2x a week, but has not been able to get herself to exercise.     Pertinent History  mulitple myeloma, stem cell transplant in June.  had to be revaccinated with childhood vaccinnations and developed jittery feelings, can't sleep, can't eat     Patient Stated  Goals  to find out what is wrong with her     Currently in Pain?  No/denies         [facility] PT Assessment - 08/31/17 0001      Assessment   Medical Diagnosis  multiple myeloma     Referring Provider  Dr Irene Limbo     Onset Date/Surgical Date  10/02/15    Hand Dominance  Right      Balance Screen   Has the patient fallen in the past 6 months  No    Has the patient had a decrease in activity level because of a fear of falling?   No    Is the patient reluctant to leave their home because of a fear of falling?   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    Leisure  is not a Chief Operating Officer and used to do yoga when  she was out of work       Charity fundraiser Status  Within Functional Limits for tasks assessed      Sit to Stand   Comments  15 repetitions in 30 sec slightly winded and fatigue       Posture/Postural Control   Posture/Postural Control  Postural limitations    Postural Limitations  Rounded Shoulders;Forward head;Increased thoracic kyphosis      Strength   Right Hand Grip (lbs)  60/60/58/    Left Hand Grip (lbs)  60/58/55      6 Minute walk- Post Test   6 Minute Walk Post Test  yes    HR (bpm)  126    02 Sat (%RA)  98 %    Modified Borg Scale for Dyspnea  2- Mild shortness of breath    Perceived Rate of Exertion (Borg)  11- Fairly light      6 minute walk test results    Aerobic Endurance Distance Walked  1210 feet      Standardized Balance Assessment   Standardized Balance Assessment  Berg Balance Test      Berg Balance Test   Sit to Stand  Able to stand without using hands and stabilize independently    Standing Unsupported   Able to stand safely 2 minutes    Sitting with Back Unsupported but Feet Supported on Floor or Stool  Able to sit safely and securely 2 minutes    Stand to Sit  Sits safely with minimal use of hands    Transfers  Able to transfer safely, minor use of hands    Standing Unsupported with Eyes Closed  Able to stand 10 seconds safely    Standing Ubsupported with Feet Together  Able to place feet together independently and stand 1 minute safely    From Standing, Reach Forward with Outstretched Arm  Can reach confidently >25 cm (10")    From Standing Position, Pick up Object from Floor  Able to pick up shoe safely and easily    From Standing Position, Turn to Look Behind Over each Shoulder  Looks behind from both sides and weight shifts well    Turn 360 Degrees  Able to turn 360 degrees safely in 4 seconds or less    Standing Unsupported, Alternately Place Feet on Step/Stool  Able to stand independently and safely and complete 8 steps in 20 seconds    Standing Unsupported, One Foot in Front  Able to place foot tandem independently and hold 30 seconds    Standing on One Leg  Able to lift leg independently and hold > 10 seconds    Total Score  56    Berg comment:  no difficulty       Functional Gait  Assessment   Gait assessed   -- no deficits             Objective measurements completed on examination: See above findings.      [place] Adult PT Treatment/Exercise - 08/31/17 0001      Self-Care   Self-Care  Other Self-Care Comments    Other Self-Care Comments   encourgaed exercise in many venues and educated pt about community options and how she could fit those into her schedule              PT Education - 08/31/17 1134    Education provided  Yes    Education Details  gave pt flyers about the exercise options at the  [facility], and [facility]  that are no cost and may be able to fit in her schedule.           PT Long Term Goals -  08/31/17 1141      PT LONG TERM GOAL #1   Title  Pt will be indpedent in a home exercise program to improve posture and increase general muscle strength and fitness     Time  4    Period  Weeks    Status  New      PT LONG TERM GOAL #2   Title  Pt will report she exercise tolerance is improved so that she can do the standing activities she is supposed to do at work     Time  4    Period  Weeks    Status  New             Plan - 08/31/17 1136    Clinical Impression Statement  Pt has complains of fatigue and restless feelings limiting her sleep and work, but all her objective tests that we did today are within normal limits except for assessment of her posture .  She would benefit from general exercise program for fitness and to improve fatigue level.  She did not make return appointment today, but may call for some if she wants to schedule. She  will consider community options and exercising on her own as she knows it will definitely help her.     History and Personal Factors relevant to plan of care:  multiple myeloma with stem cell transplant     Clinical Presentation  Stable    Clinical Decision Making  Low    Rehab Potential  Good    PT Frequency  2x / week    PT Duration  4 weeks    PT Treatment/Interventions  Therapeutic exercise;Patient/family education    PT Next Visit Plan  bike/nustep/treamill, teach HEP and progress     Consulted and Agree with Plan of Care  Patient       Patient will benefit from skilled therapeutic intervention in order to improve the following deficits and impairments:  Decreased activity tolerance, Abnormal gait  Visit Diagnosis: Muscle weakness (generalized) - Plan: PT plan of care cert/re-cert  Abnormal posture - Plan: PT plan of care cert/re-cert     Problem List Patient Active Problem List   Diagnosis Date Noted  . Urinary tract infection without hematuria   . Muscular fasciculation   . Other fatigue   . Abdominal pain   . FTT  (failure to thrive) in adult 08/09/2017  . Counseling regarding advanced care planning and goals of care 08/03/2017  . Gastroesophageal reflux disease without esophagitis 01/17/2017  . S/P autologous bone marrow transplantation ([place]) 03/29/2016  . Near syncope 01/04/2016  . Peripheral edema 12/19/2015  . Hematuria 11/08/2015  . Rash 11/01/2015  . Multiple myeloma ([place]) 10/19/2015  . Metastatic multiple myeloma to bone ([place]) 10/19/2015  . Plasma cell dyscrasia 10/05/2015  . Anemia 09/29/2015  . Hypergammaglobulinemia 09/29/2015  . HYPOXEMIA 12/16/2007  . HYPERLIPIDEMIA 12/04/2007  . DYSPNEA 12/04/2007  . MALIGNANT NEOPLASM OF THYMUS 12/03/2007  . OBSTRUCTIVE SLEEP APNEA 12/03/2007  . Restless leg syndrome 12/03/2007   Donato Heinz. Owens Shark PT  Norwood Levo 08/31/2017, 11:47 AM  [place], [place], [address] Phone: [phone]   Fax:  [phone]  Name: ANUM PALECEK MRN:  [id number] Date of Birth: [birth year]

## 2017-09-04 ENCOUNTER — Telehealth: Payer: Self-pay | Admitting: *Deleted

## 2017-09-04 NOTE — Telephone Encounter (Signed)
"  I'd like to receive a call to speak with the nurse.  [phone]." Routing call information to collaborative nurse and provider for review.  Further patient communication through collaborative nurse.

## 2017-09-04 NOTE — Telephone Encounter (Signed)
Returned phone call to pt.  Pt states she is "Feeling a little better.  I am sleeping a little better, and eating a little better.  My insides are still a little "screwy".  Is it ok to go back on the Revlimid?"  Reviewed with [name].  Per MD, pt should wait until feeling 100% better before restarting Revlimid.  Reviewed with pt, pt verbalized understanding.  Pt will wait to restart medication.

## 2017-09-11 ENCOUNTER — Ambulatory Visit: Payer: 59 | Admitting: Obstetrics and Gynecology

## 2017-09-11 ENCOUNTER — Other Ambulatory Visit: Payer: Self-pay

## 2017-09-11 ENCOUNTER — Ambulatory Visit: Payer: 59 | Admitting: Hematology

## 2017-09-11 ENCOUNTER — Other Ambulatory Visit: Payer: 59

## 2017-09-11 ENCOUNTER — Encounter: Payer: Self-pay | Admitting: Obstetrics and Gynecology

## 2017-09-11 VITALS — BP 110/70 | HR 84 | Resp 16 | Wt 125.0 lb

## 2017-09-11 DIAGNOSIS — Z4689 Encounter for fitting and adjustment of other specified devices: Secondary | ICD-10-CM | POA: Diagnosis not present

## 2017-09-11 DIAGNOSIS — N899 Noninflammatory disorder of vagina, unspecified: Secondary | ICD-10-CM | POA: Diagnosis not present

## 2017-09-11 DIAGNOSIS — N898 Other specified noninflammatory disorders of vagina: Secondary | ICD-10-CM

## 2017-09-11 DIAGNOSIS — N8111 Cystocele, midline: Secondary | ICD-10-CM | POA: Diagnosis not present

## 2017-09-11 DIAGNOSIS — T8389XA Other specified complication of genitourinary prosthetic devices, implants and grafts, initial encounter: Secondary | ICD-10-CM

## 2017-09-11 NOTE — Progress Notes (Signed)
GYNECOLOGY  VISIT   HPI: 61 y.o.   Married  Caucasian  female   G2P2002 with No LMP recorded. Patient has had a hysterectomy.   here for  Follow up, pessary check. The patient developed vaginal ulceration from her pessary about 4 weeks ago and was told to leave her pessary out and increase her use of vaginal estrogen. She couldn't get in to see Dr Quincy Simmonds in a week.  The pessary was out for a week, and has had it in and out since then. Currently using the estrogen cream 2 x a week, but has used it every night for the last 5 nights. She was bleeding last week when using the pessary. She typically takes her pessary out, puts the cream on the pessary and then puts it in again.     H/O hysterectomy. She is a Web designer at [employer].  GYNECOLOGIC HISTORY: No LMP recorded. Patient has had a hysterectomy. Contraception:hysterectomy  Menopausal hormone therapy: none         OB History    Gravida Para Term Preterm AB Living   2 2 2     2    SAB TAB Ectopic Multiple Live Births                     Patient Active Problem List   Diagnosis Date Noted  . Urinary tract infection without hematuria   . Muscular fasciculation   . Other fatigue   . Abdominal pain   . FTT (failure to thrive) in adult 08/09/2017  . Counseling regarding advanced care planning and goals of care 08/03/2017  . Gastroesophageal reflux disease without esophagitis 01/17/2017  . S/P autologous bone marrow transplantation ([place]) 03/29/2016  . Near syncope 01/04/2016  . Peripheral edema 12/19/2015  . Hematuria 11/08/2015  . Rash 11/01/2015  . Multiple myeloma ([place]) 10/19/2015  . Metastatic multiple myeloma to bone ([place]) 10/19/2015  . Plasma cell dyscrasia 10/05/2015  . Anemia 09/29/2015  . Hypergammaglobulinemia 09/29/2015  . HYPOXEMIA 12/16/2007  . HYPERLIPIDEMIA 12/04/2007  . DYSPNEA 12/04/2007  . MALIGNANT NEOPLASM OF THYMUS 12/03/2007  . OBSTRUCTIVE SLEEP APNEA 12/03/2007  . Restless leg syndrome 12/03/2007     Past Medical History:  Diagnosis Date  . Abnormal Pap smear of vagina 03/22/2017   ASCUS pap and negative HR HPV.  Patient is on immunosuppressive medication.   . Cancer ([employer]) 1999   squamous cell carcinoma of thymus  . Fibroid    reason for Hysterectomy  . Multiple myeloma ([place]) 08/2015  . Pain aggravated by activities of daily living    states she pulled something at right sternum in dec 2016  . PONV (postoperative nausea and vomiting)    thinks morphine caused nausea  . Prediabetes   . Urinary incontinence     Past Surgical History:  Procedure Laterality Date  . ABDOMINAL HYSTERECTOMY  1997   TAH--ovaries remain--Dr. Newton Pigg  . THYMECTOMY  1999    Current Outpatient Medications  Medication Sig Dispense Refill  . aspirin EC 81 MG tablet Take 81 mg by mouth daily.    . Calcium Carb-Cholecalciferol (CALCIUM+D3) 600-800 MG-UNIT TABS Take 2 (two) times daily by mouth.    . gabapentin (NEURONTIN) 300 MG capsule Take 2 capsules (600 mg total) at bedtime by mouth. 30 capsule 0  . lenalidomide (REVLIMID) 15 MG capsule Take 1 capsule (15 mg total) by mouth as directed. Take 1 capsule daily for 21 Days, take 7 Days Off. Auth# [id number] 08/29/17  21 capsule 0  . Polyethylene Glycol 3350 (MIRALAX PO) Take 1 packet daily as needed by mouth (constipation).      No current facility-administered medications for this visit.      ALLERGIES: Penicillins and Ampicillin  Family History  Problem Relation Age of Onset  . COPD Father        dec age 41/s-smoked  . Diabetes Brother   . Other Brother        committed suicide age 32  . Hyperlipidemia Mother   . Diabetes Maternal Grandmother     Social History   Socioeconomic History  . Marital status: Married    Spouse name: Not on file  . Number of children: Not on file  . Years of education: Not on file  . Highest education level: Not on file  Social Needs  . Financial resource strain: Not on file  . Food insecurity - worry:  Not on file  . Food insecurity - inability: Not on file  . Transportation needs - medical: Not on file  . Transportation needs - non-medical: Not on file  Occupational History  . Not on file  Tobacco Use  . Smoking status: Never Smoker  . Smokeless tobacco: Never Used  Substance and Sexual Activity  . Alcohol use: No    Alcohol/week: 0.0 oz  . Drug use: No  . Sexual activity: No    Partners: Male    Birth control/protection: Surgical    Comment: TAH--ovaries remain  Other Topics Concern  . Not on file  Social History Narrative  . Not on file    Review of Systems  Constitutional: Negative.   HENT: Negative.   Eyes: Negative.   Respiratory: Negative.   Cardiovascular: Negative.   Gastrointestinal: Negative.   Genitourinary: Negative.   Musculoskeletal: Negative.   Skin: Negative.   Neurological: Negative.   Endo/Heme/Allergies: Negative.   Psychiatric/Behavioral: Negative.     PHYSICAL EXAMINATION:    BP 110/70 (BP Location: Right Arm, Patient Position: Sitting, Cuff Size: Normal)   Pulse 84   Resp 16   Wt 125 lb (56.7 kg)   BMI 19.01 kg/m     General appearance: alert, cooperative and appears stated age  Pelvic: External genitalia:  no lesions              Urethra:  normal appearing urethra with no masses, tenderness or lesions              Bartholins and Skenes: normal                 Vagina: mild vaginal atrophy, large area of granulation tissue at the vaginal cuff, treated with silver nitrate. She has bilateral ulcerations laterally at the vaginal apex, both under a cm and mild, not friable.   With a cough in the supine position she had a grade 2 cystocele (patient reports worse when standing, this portion of the exam wasn't done)              Cervix: absent              Bimanual Exam:  Uterus:  uterus absent              Adnexa: no mass, fullness, tenderness   She was fitted with a #5 ring pessary with support, she felt it was comfortable, but seemed to be  pushing firmly in the area of her vaginal irritation. She was fitted with a #4 ring pessary with support comfortable in the  office.               Chaperone was present for exam.  ASSESSMENT Vaginal irritation from her pessary Symptomatic cystocele    PLAN Granulation tissue treated Fitted with a #4 ring pessary Discussed that it is optimal to leave the pessary out to heal, she is having a hard time working without the pessary She will just use the pessary over the next week when she is at work. Otherwise she will take it out. Use 1/2 gram of estrogen cream qhs.  F/U in 1 week   An After Visit Summary was printed and given to the patient.  ~15 minutes face to face time of which over 50% was spent in counseling.

## 2017-09-12 ENCOUNTER — Encounter: Payer: 59 | Admitting: Nutrition

## 2017-09-14 ENCOUNTER — Ambulatory Visit (HOSPITAL_BASED_OUTPATIENT_CLINIC_OR_DEPARTMENT_OTHER): Payer: 59

## 2017-09-14 ENCOUNTER — Other Ambulatory Visit (HOSPITAL_BASED_OUTPATIENT_CLINIC_OR_DEPARTMENT_OTHER): Payer: 59

## 2017-09-14 ENCOUNTER — Ambulatory Visit (HOSPITAL_BASED_OUTPATIENT_CLINIC_OR_DEPARTMENT_OTHER): Payer: 59 | Admitting: Hematology

## 2017-09-14 ENCOUNTER — Encounter: Payer: Self-pay | Admitting: Hematology

## 2017-09-14 ENCOUNTER — Ambulatory Visit: Payer: 59 | Admitting: Obstetrics and Gynecology

## 2017-09-14 VITALS — BP 130/72 | HR 95 | Temp 98.5°F | Resp 18 | Ht 68.0 in | Wt 124.0 lb

## 2017-09-14 DIAGNOSIS — C9001 Multiple myeloma in remission: Secondary | ICD-10-CM | POA: Diagnosis not present

## 2017-09-14 DIAGNOSIS — R5383 Other fatigue: Secondary | ICD-10-CM

## 2017-09-14 DIAGNOSIS — G4700 Insomnia, unspecified: Secondary | ICD-10-CM | POA: Diagnosis not present

## 2017-09-14 DIAGNOSIS — C9000 Multiple myeloma not having achieved remission: Secondary | ICD-10-CM

## 2017-09-14 DIAGNOSIS — Z7189 Other specified counseling: Secondary | ICD-10-CM

## 2017-09-14 DIAGNOSIS — R630 Anorexia: Secondary | ICD-10-CM

## 2017-09-14 LAB — CBC & DIFF AND RETIC
BASO%: 0.2 % (ref 0.0–2.0)
Basophils Absolute: 0 10*3/uL (ref 0.0–0.1)
EOS ABS: 0 10*3/uL (ref 0.0–0.5)
EOS%: 0.7 % (ref 0.0–7.0)
HCT: 38.7 % (ref 34.8–46.6)
HGB: 13 g/dL (ref 11.6–15.9)
IMMATURE RETIC FRACT: 2.6 % (ref 1.60–10.00)
LYMPH%: 19.7 % (ref 14.0–49.7)
MCH: 31.9 pg (ref 25.1–34.0)
MCHC: 33.6 g/dL (ref 31.5–36.0)
MCV: 95.1 fL (ref 79.5–101.0)
MONO#: 0.4 10*3/uL (ref 0.1–0.9)
MONO%: 10.2 % (ref 0.0–14.0)
NEUT%: 69.2 % (ref 38.4–76.8)
NEUTROS ABS: 3 10*3/uL (ref 1.5–6.5)
NRBC: 0 % (ref 0–0)
PLATELETS: 266 10*3/uL (ref 145–400)
RBC: 4.07 10*6/uL (ref 3.70–5.45)
RDW: 14.4 % (ref 11.2–14.5)
Retic %: 1.3 % (ref 0.70–2.10)
Retic Ct Abs: 52.91 10*3/uL (ref 33.70–90.70)
WBC: 4.3 10*3/uL (ref 3.9–10.3)
lymph#: 0.9 10*3/uL (ref 0.9–3.3)

## 2017-09-14 LAB — COMPREHENSIVE METABOLIC PANEL
ALBUMIN: 4.1 g/dL (ref 3.5–5.0)
ALK PHOS: 70 U/L (ref 40–150)
ALT: 9 U/L (ref 0–55)
ANION GAP: 10 meq/L (ref 3–11)
AST: 12 U/L (ref 5–34)
BUN: 13.3 mg/dL (ref 7.0–26.0)
CALCIUM: 9.3 mg/dL (ref 8.4–10.4)
CHLORIDE: 108 meq/L (ref 98–109)
CO2: 22 mEq/L (ref 22–29)
Creatinine: 0.8 mg/dL (ref 0.6–1.1)
EGFR: >60 mL/min/{1.73_m2} (ref >60)
Glucose: 97 mg/dl (ref 70–140)
Potassium: 4 mEq/L (ref 3.5–5.1)
Sodium: 140 mEq/L (ref 136–145)
Total Bilirubin: 1.1 mg/dL (ref 0.20–1.20)
Total Protein: 7.4 g/dL (ref 6.4–8.3)

## 2017-09-14 LAB — MAGNESIUM: Magnesium: 2.4 mg/dl (ref 1.5–2.5)

## 2017-09-14 MED ORDER — MIRTAZAPINE 15 MG PO TABS: 15.0000 mg | ORAL_TABLET | Freq: Every day | ORAL | 1 refills | Status: DC

## 2017-09-14 MED ORDER — ZOLEDRONIC ACID 4 MG/100ML IV SOLN
4.0000 mg | Freq: Once | INTRAVENOUS | Status: AC
  Administered 2017-09-14: 4 mg via INTRAVENOUS
  Filled 2017-09-14: qty 100

## 2017-09-14 NOTE — Patient Instructions (Signed)
Thank you for choosing [facility] to provide your oncology and hematology care.  To afford each patient quality time with our providers, please arrive 30 minutes before your scheduled appointment time.  If you arrive late for your appointment, you may be asked to reschedule.  We strive to give you quality time with our providers, and arriving late affects you and other patients whose appointments are after yours.   If you are a no show for multiple scheduled visits, you may be dismissed from the clinic at the providers discretion.    Again, thank you for choosing [facility], our hope is that these requests will decrease the amount of time that you wait before being seen by our physicians.  ______________________________________________________________________  Should you have questions after your visit to the [facility], please contact our office at [phone] between the hours of 8:30 and 4:30 p.m.    Voicemails left after 4:30p.m will not be returned until the following business day.    For prescription refill requests, please have your pharmacy contact us directly.  Please also try to allow 48 hours for prescription requests.    Please contact the scheduling department for questions regarding scheduling.  For scheduling of procedures such as PET scans, CT scans, MRI, Ultrasound, etc please contact central scheduling at [phone].    Resources For Cancer Patients and Caregivers:   Oncolink.org:  A wonderful resource for patients and healthcare providers for information regarding your disease, ways to tract your treatment, what to expect, etc.     American Cancer Society:  800-227-2345  Can help patients locate various types of support and financial assistance  Cancer Care: 1-800-813-HOPE (4673) Provides financial assistance, online support groups, medication/co-pay assistance.    [place] DSS:  336-641-3447 Where to apply for food  stamps, Medicaid, and utility assistance  Medicare Rights Center: 800-333-4114 Helps people with Medicare understand their rights and benefits, navigate the Medicare system, and secure the quality healthcare they deserve  SCAT: 336-333-6589 [place] [employer]'s shared-ride transportation service for eligible riders who have a disability that prevents them from riding the fixed route bus.    For additional information on assistance programs please contact our social worker:   Grier Hock/Abigail Elmore:  [phone]            

## 2017-09-14 NOTE — Patient Instructions (Signed)

## 2017-09-15 LAB — PHOSPHORUS: Phosphorus, Ser: 3.2 mg/dL (ref 2.5–4.5)

## 2017-09-19 ENCOUNTER — Encounter: Payer: Self-pay | Admitting: Obstetrics and Gynecology

## 2017-09-19 ENCOUNTER — Telehealth: Payer: Self-pay | Admitting: Hematology

## 2017-09-19 ENCOUNTER — Other Ambulatory Visit: Payer: Self-pay

## 2017-09-19 ENCOUNTER — Ambulatory Visit (INDEPENDENT_AMBULATORY_CARE_PROVIDER_SITE_OTHER): Payer: 59 | Admitting: Obstetrics and Gynecology

## 2017-09-19 VITALS — BP 112/60 | HR 76 | Resp 14 | Wt 125.0 lb

## 2017-09-19 DIAGNOSIS — N898 Other specified noninflammatory disorders of vagina: Secondary | ICD-10-CM

## 2017-09-19 DIAGNOSIS — Z4689 Encounter for fitting and adjustment of other specified devices: Secondary | ICD-10-CM

## 2017-09-19 DIAGNOSIS — N899 Noninflammatory disorder of vagina, unspecified: Secondary | ICD-10-CM | POA: Diagnosis not present

## 2017-09-19 DIAGNOSIS — T8389XA Other specified complication of genitourinary prosthetic devices, implants and grafts, initial encounter: Secondary | ICD-10-CM

## 2017-09-19 NOTE — Progress Notes (Signed)
GYNECOLOGY  VISIT   HPI: 61 y.o.   Married  Caucasian  female   G2P2002 with No LMP recorded. Patient has had a hysterectomy.   here for pessary recheck. When she was seen on 12/18 she had significant vaginal irritation from the pessary she was using. Granulation tissue was treated and she was fitted with a smaller pessary. She has been only using the pessary at work in the last week and has been using nightly estrogen. Had the pessary out x 6 days.  She is feeling great with this pessary, no more bleeding.   GYNECOLOGIC HISTORY: No LMP recorded. Patient has had a hysterectomy. Contraception:hysterectomy  Menopausal hormone therapy: none         OB History    Gravida Para Term Preterm AB Living   2 2 2     2    SAB TAB Ectopic Multiple Live Births                     Patient Active Problem List   Diagnosis Date Noted  . Urinary tract infection without hematuria   . Muscular fasciculation   . Other fatigue   . Abdominal pain   . FTT (failure to thrive) in adult 08/09/2017  . Counseling regarding advanced care planning and goals of care 08/03/2017  . Gastroesophageal reflux disease without esophagitis 01/17/2017  . S/P autologous bone marrow transplantation ([place]) 03/29/2016  . Near syncope 01/04/2016  . Peripheral edema 12/19/2015  . Hematuria 11/08/2015  . Rash 11/01/2015  . Multiple myeloma ([place]) 10/19/2015  . Metastatic multiple myeloma to bone ([place]) 10/19/2015  . Plasma cell dyscrasia 10/05/2015  . Anemia 09/29/2015  . Hypergammaglobulinemia 09/29/2015  . HYPOXEMIA 12/16/2007  . HYPERLIPIDEMIA 12/04/2007  . DYSPNEA 12/04/2007  . MALIGNANT NEOPLASM OF THYMUS 12/03/2007  . OBSTRUCTIVE SLEEP APNEA 12/03/2007  . Restless leg syndrome 12/03/2007    Past Medical History:  Diagnosis Date  . Abnormal Pap smear of vagina 03/22/2017   ASCUS pap and negative HR HPV.  Patient is on immunosuppressive medication.   . Cancer ([place]) 1999   squamous cell carcinoma of thymus  .  Fibroid    reason for Hysterectomy  . Multiple myeloma ([place]) 08/2015  . Pain aggravated by activities of daily living    states she pulled something at right sternum in dec 2016  . PONV (postoperative nausea and vomiting)    thinks morphine caused nausea  . Prediabetes   . Urinary incontinence     Past Surgical History:  Procedure Laterality Date  . ABDOMINAL HYSTERECTOMY  1997   TAH--ovaries remain--Dr. Newton Pigg  . THYMECTOMY  1999    Current Outpatient Medications  Medication Sig Dispense Refill  . aspirin EC 81 MG tablet Take 81 mg by mouth daily.    . Calcium Carb-Cholecalciferol (CALCIUM+D3) 600-800 MG-UNIT TABS Take 2 (two) times daily by mouth.    . gabapentin (NEURONTIN) 300 MG capsule Take 2 capsules (600 mg total) at bedtime by mouth. 30 capsule 0  . lenalidomide (REVLIMID) 15 MG capsule Take 1 capsule (15 mg total) by mouth as directed. Take 1 capsule daily for 21 Days, take 7 Days Off. Auth# [id number] 08/29/17 21 capsule 0  . mirtazapine (REMERON) 15 MG tablet Take 1 tablet (15 mg total) by mouth at bedtime. 30 tablet 1  . MISC NATURAL PRODUCTS PO Take 15 mg by mouth 2 (two) times daily.    . Polyethylene Glycol 3350 (MIRALAX PO) Take 1 packet daily as  needed by mouth (constipation).      No current facility-administered medications for this visit.      ALLERGIES: Penicillins and Ampicillin  Family History  Problem Relation Age of Onset  . COPD Father        dec age 38/s-smoked  . Diabetes Brother   . Other Brother        committed suicide age 64  . Hyperlipidemia Mother   . Diabetes Maternal Grandmother     Social History   Socioeconomic History  . Marital status: Married    Spouse name: Not on file  . Number of children: Not on file  . Years of education: Not on file  . Highest education level: Not on file  Social Needs  . Financial resource strain: Not on file  . Food insecurity - worry: Not on file  . Food insecurity - inability: Not on file   . Transportation needs - medical: Not on file  . Transportation needs - non-medical: Not on file  Occupational History  . Not on file  Tobacco Use  . Smoking status: Never Smoker  . Smokeless tobacco: Never Used  Substance and Sexual Activity  . Alcohol use: No    Alcohol/week: 0.0 oz  . Drug use: No  . Sexual activity: No    Partners: Male    Birth control/protection: Surgical    Comment: TAH--ovaries remain  Other Topics Concern  . Not on file  Social History Narrative  . Not on file    Review of Systems  Constitutional: Negative.   HENT: Negative.   Eyes: Negative.   Respiratory: Negative.   Cardiovascular: Negative.   Gastrointestinal: Negative.   Genitourinary: Negative.   Musculoskeletal: Negative.   Skin: Negative.   Neurological: Negative.   Endo/Heme/Allergies: Negative.   Psychiatric/Behavioral: Negative.     PHYSICAL EXAMINATION:    BP 112/60 (BP Location: Right Arm, Patient Position: Sitting, Cuff Size: Normal)   Pulse 76   Resp 14   Wt 125 lb (56.7 kg)   BMI 19.01 kg/m     General appearance: alert, cooperative and appears stated age  Pelvic: External genitalia:  no lesions              Urethra:  normal appearing urethra with no masses, tenderness or lesions              Bartholins and Skenes: normal                 Vagina: normal appearing vagina with normal color and discharge, no lesions              Cervix: absent, vaginal cuff erythematous, but no more granulation tissue, does look friable, vaginal ulcers are improving  Chaperone was present for exam.  ASSESSMENT Vaginal irritation from pessary Doing better with the smaller pessary, time without the pessary and vaginal estrogen    PLAN Leave pessary out when she isn't working Continue with estrogen cream nightly x 1 week, then every other night x 1 week F/U in 2 weeks   An After Visit Summary was printed and given to the patient.

## 2017-09-19 NOTE — Telephone Encounter (Signed)
Spoke to patient regarding upcoming February appointments.  °

## 2017-09-20 ENCOUNTER — Ambulatory Visit: Payer: 59 | Admitting: Nutrition

## 2017-09-20 NOTE — Progress Notes (Signed)
HEMATOLOGY/ONCOLOGY INPATIENT PROGRESS NOTE  Date of Service: .09/14/2017  Inpatient Attending: .Brunetta Genera, MD  SUBJECTIVE  [name] is here for fu of her multiple myeloma. She notes that she has started using CBD oil and has been feeling better overall. She did see her counsellor and does not feel she is depressed. Notes improved po intake. Notes that the celexa made her feel more anxious so she stopped taking it in a few days. She is still having sleeping issues and is willing to try Rermeron for help with sleep and appetite.  On ROS, pt reports loss of appetite, chills, insomnia, jitteriness, fatigue and denies difficulty swallowing, fever and any other accompanying symptoms.  OBJECTIVE:  Appearing a little brighter today.  PHYSICAL EXAMINATION: . Vitals:   09/14/17 1115  BP: 130/72  Pulse: 95  Resp: 18  Temp: 98.5 F (36.9 C)  TempSrc: Oral  SpO2: 100%  Weight: 124 lb (56.2 kg)  Height: 5' 8" (1.727 m)   Filed Weights   09/14/17 1115  Weight: 124 lb (56.2 kg)   .Body mass index is 18.85 kg/m.  GENERAL:alert, in no acute distress and comfortable SKIN: skin color, texture, turgor are normal, no rashes or significant lesions EYES: normal, conjunctiva are pink and non-injected, sclera clear OROPHARYNX:no exudate, no erythema and lips, buccal mucosa, and tongue normal  NECK: supple, no JVD, thyroid normal size, non-tender, without nodularity LYMPH:  no palpable lymphadenopathy in the cervical, axillary or inguinal LUNGS: clear to auscultation with normal respiratory effort HEART: regular rate & rhythm,  no murmurs and no lower extremity edema ABDOMEN: abdomen soft, non-tender, normoactive bowel sounds  Musculoskeletal: no cyanosis of digits and no clubbing  PSYCH: alert & oriented x 3 with fluent speech NEURO: no focal motor/sensory deficits  MEDICAL HISTORY:   Past Medical History:  Diagnosis Date  . Abnormal Pap smear of vagina 03/22/2017   ASCUS pap and negative HR HPV.  Patient is on immunosuppressive medication.   . Cancer ([place]) 1999   squamous cell carcinoma of thymus  . Fibroid    reason for Hysterectomy  . Multiple myeloma ([place]) 08/2015  . Pain aggravated by activities of daily living    states she pulled something at right sternum in dec 2016  . PONV (postoperative nausea and vomiting)    thinks morphine caused nausea  . Prediabetes   . Urinary incontinence     SURGICAL HISTORY: Past Surgical History:  Procedure Laterality Date  . ABDOMINAL HYSTERECTOMY  1997   TAH--ovaries remain--Dr. Newton Pigg  . THYMECTOMY  1999    SOCIAL HISTORY: Social History   Socioeconomic History  . Marital status: Married    Spouse name: Not on file  . Number of children: Not on file  . Years of education: Not on file  . Highest education level: Not on file  Social Needs  . Financial resource strain: Not on file  . Food insecurity - worry: Not on file  . Food insecurity - inability: Not on file  . Transportation needs - medical: Not on file  . Transportation needs - non-medical: Not on file  Occupational History  . Not on file  Tobacco Use  . Smoking status: Never Smoker  . Smokeless tobacco: Never Used  Substance and Sexual Activity  . Alcohol use: No    Alcohol/week: 0.0 oz  . Drug use: No  . Sexual activity: No    Partners: Male    Birth control/protection: Surgical    Comment: TAH--ovaries  remain  Other Topics Concern  . Not on file  Social History Narrative  . Not on file    FAMILY HISTORY: Family History  Problem Relation Age of Onset  . COPD Father        dec age 37/s-smoked  . Diabetes Brother   . Other Brother        committed suicide age 61  . Hyperlipidemia Mother   . Diabetes Maternal Grandmother     ALLERGIES:  is allergic to penicillins and ampicillin.  MEDICATIONS:  . Current Outpatient Medications:  .  aspirin EC 81 MG tablet, Take 81 mg by mouth daily., Disp: , Rfl:  .   Calcium Carb-Cholecalciferol (CALCIUM+D3) 600-800 MG-UNIT TABS, Take 2 (two) times daily by mouth., Disp: , Rfl:  .  gabapentin (NEURONTIN) 300 MG capsule, Take 2 capsules (600 mg total) at bedtime by mouth., Disp: 30 capsule, Rfl: 0 .  lenalidomide (REVLIMID) 15 MG capsule, Take 1 capsule (15 mg total) by mouth as directed. Take 1 capsule daily for 21 Days, take 7 Days Off. Auth# [id number] 08/29/17, Disp: 21 capsule, Rfl: 0 .  MISC NATURAL PRODUCTS PO, Take 15 mg by mouth 2 (two) times daily., Disp: , Rfl:  .  Polyethylene Glycol 3350 (MIRALAX PO), Take 1 packet daily as needed by mouth (constipation). , Disp: , Rfl:  .  mirtazapine (REMERON) 15 MG tablet, Take 1 tablet (15 mg total) by mouth at bedtime., Disp: 30 tablet, Rfl: 1   REVIEW OF SYSTEMS:    10 Point review of Systems was done is negative except as noted above.   LABORATORY DATA:  I have reviewed the data as listed  . CBC Latest Ref Rng & Units 09/14/2017 08/22/2017 08/11/2017  WBC 3.9 - 10.3 10e3/uL 4.3 4.7 3.3(L)  Hemoglobin 11.6 - 15.9 g/dL 13.0 12.8 11.2(L)  Hematocrit 34.8 - 46.6 % 38.7 38.4 31.9(L)  Platelets 145 - 400 10e3/uL 266 324 297    . CMP Latest Ref Rng & Units 09/14/2017 08/22/2017 08/12/2017  Glucose 70 - 140 mg/dl 97 137 98  BUN 7.0 - 26.0 mg/dL 13.3 21.9 8  Creatinine 0.6 - 1.1 mg/dL 0.8 0.8 0.60  Sodium 136 - 145 mEq/L 140 136 133(L)  Potassium 3.5 - 5.1 mEq/L 4.0 4.2 3.6  Chloride 101 - 111 mmol/L - - 105  CO2 22 - 29 mEq/L _0 Calcium 8.4 - 10.4 mg/dL 9.3 10.2 8.4(L)  Total Protein 6.4 - 8.3 g/dL 7.4 7.4 -  Total Bilirubin 0.20 - 1.20 mg/dL 1.10 0.56 -  Alkaline Phos 40 - 150 U/L 70 80 -  AST 5 - 34 U/L 12 12 -  ALT 0 - 55 U/L 9 12 -     RADIOGRAPHIC STUDIES: I have personally reviewed the radiological images as listed and agreed with the findings in the report. No results found.  ASSESSMENT & PLAN:   61 y.o. caucasian female with  1. H/o Multiple myeloma - currently in  remission.  IgA lambda R-ISS stage II multiple myeloma with innumerable lytic lesions in the calvarium, lesion in T7 vertebra and multiple lesions in the pelvis.  No significant bone pain at this time. Diagnosed in January 2017.  Current treatment  -Revlimid maintenance 19m po daily (on hold given her symptoms)  Previous Treatment  Status post Vd x 1 cycle VRd x 6 cycles  HD Melphalan 2044mm2 on 03/22/2016 Autologous HSCT on 03/23/2016 with Dr GaSamule Ohmt [facility] 6.4 x10^6/kg)  PLAN -currently  holding Revlimid until patient feels better. -At this time she has been off the Revlimid for nearly a week.  We will continue to keep off Revlimid at this time.  2.  New onset progressive persistent fatigue .  Associated with generalized jitteriness ?muscle fasiculations. ?post vaccination symptoms. patient associates this with having recent posttransplant vaccination . No HTN  No rashes.  No focal neurological deficits. Evaluated by neurology as inpatient and EMG/NCS not recommended. Has recently had what appears to be a viral URI. TSH levels within normal limits. CK levels within normal limits.  3.  Poor appetite with early satiety .  Poorly defined middle to upper abdominal discomfort. Imaging and EGD --unrevealing other than mild gastritis  4.  Mild anemia.  Patient noted to have some vaginal bleeding. Resolved. We will need to monitor.  Plan -Patient at this time notes that she is feeling a little better after she started taking CBD oil. Improvement in fatigue and appetite.  --referral to nutritionist Ernestene Kiel -pending -Recommend sleep apnea evaluation per PCP. - Celexa for anxiety/depression - discontinued by patient -noted increased anxiety.  Switched to Remeron to try to help with insomnia and appetite. -continuing to hold Revlimid at this time. Will consider restarting at Revlimid 48m in 2 months if she feels okay.   RTC with Dr KIrene Limboin 2 months with  labs Continue Zometa q236month  GaSullivan LoneD MSMontvaleAHIVMS SCPrescott Outpatient Surgical CenterTCedar Oaks Surgery Center LLCematology/Oncology Physician CoMercy Hospital El Reno(Office):       [phone] cell):  [phone]):           [phone]

## 2017-09-20 NOTE — Progress Notes (Signed)
61 year old female diagnosed with multiple myeloma with a past medical history of urinary incontinence and prediabetes. Patient reports she almost canceled nutrition appointment because she is now eating well. She no longer drinks Ensure Plus and is able to eat food without difficulty. Weight was documented as 125 pounds, December 26. Provided basic information on healthy eating and gave patient my contact information in case she has any questions or concerns.  **Disclaimer: This note was dictated with voice recognition software. Similar sounding words can inadvertently be transcribed and this note may contain transcription errors which may not have been corrected upon publication of note.**

## 2017-09-25 ENCOUNTER — Other Ambulatory Visit: Payer: Self-pay | Admitting: Hematology

## 2017-09-25 DIAGNOSIS — C9000 Multiple myeloma not having achieved remission: Secondary | ICD-10-CM

## 2017-09-28 ENCOUNTER — Other Ambulatory Visit: Payer: Self-pay

## 2017-09-28 ENCOUNTER — Other Ambulatory Visit: Payer: Self-pay | Admitting: Hematology

## 2017-09-28 DIAGNOSIS — C9000 Multiple myeloma not having achieved remission: Secondary | ICD-10-CM

## 2017-09-28 MED ORDER — LENALIDOMIDE 15 MG PO CAPS: 15.0000 mg | ORAL_CAPSULE | ORAL | 0 refills | Status: DC

## 2017-10-04 ENCOUNTER — Ambulatory Visit (INDEPENDENT_AMBULATORY_CARE_PROVIDER_SITE_OTHER): Payer: 59 | Admitting: Obstetrics and Gynecology

## 2017-10-04 ENCOUNTER — Encounter: Payer: Self-pay | Admitting: Obstetrics and Gynecology

## 2017-10-04 ENCOUNTER — Other Ambulatory Visit: Payer: Self-pay

## 2017-10-04 VITALS — BP 118/60 | HR 72 | Resp 14 | Wt 129.0 lb

## 2017-10-04 DIAGNOSIS — N8111 Cystocele, midline: Secondary | ICD-10-CM

## 2017-10-04 DIAGNOSIS — N899 Noninflammatory disorder of vagina, unspecified: Secondary | ICD-10-CM | POA: Diagnosis not present

## 2017-10-04 DIAGNOSIS — N898 Other specified noninflammatory disorders of vagina: Secondary | ICD-10-CM

## 2017-10-04 DIAGNOSIS — T8389XA Other specified complication of genitourinary prosthetic devices, implants and grafts, initial encounter: Secondary | ICD-10-CM

## 2017-10-04 NOTE — Progress Notes (Signed)
GYNECOLOGY  VISIT   HPI: 62 y.o.   Married  Caucasian  female   G2P2002 with No LMP recorded. Patient has had a hysterectomy.   here for pessary check. The patient was seen in mid December with vaginal irritation from her pessary. Granulation tissue was treated, estrogen cream use was increased and she was fitted with a new smaller pessary (#4 ring with support). At her 2 week f/u she was doing better. She has just been using the pessary when she is working, she is using the estrogen cream nightly. She is comfortable with the pessary in, holds her prolapse well. She has noticed a small amount of spotting when she removes the pessary. With the prior pessary she was bleeding daily.  She reports that when she isn't using the pessary, her vagina is outside of her body and is rubbing dry and uncomfortable.     GYNECOLOGIC HISTORY: No LMP recorded. Patient has had a hysterectomy. Contraception:hysterectomy  Menopausal hormone therapy: estradiol         OB History    Gravida Para Term Preterm AB Living   _0 SAB TAB Ectopic Multiple Live Births                     Patient Active Problem List   Diagnosis Date Noted  . Urinary tract infection without hematuria   . Muscular fasciculation   . Other fatigue   . Abdominal pain   . FTT (failure to thrive) in adult 08/09/2017  . Counseling regarding advanced care planning and goals of care 08/03/2017  . Gastroesophageal reflux disease without esophagitis 01/17/2017  . S/P autologous bone marrow transplantation ([place]) 03/29/2016  . Near syncope 01/04/2016  . Peripheral edema 12/19/2015  . Hematuria 11/08/2015  . Rash 11/01/2015  . Multiple myeloma ([place]) 10/19/2015  . Metastatic multiple myeloma to bone ([place]) 10/19/2015  . Plasma cell dyscrasia 10/05/2015  . Anemia 09/29/2015  . Hypergammaglobulinemia 09/29/2015  . HYPOXEMIA 12/16/2007  . HYPERLIPIDEMIA 12/04/2007  . DYSPNEA 12/04/2007  . MALIGNANT NEOPLASM OF THYMUS 12/03/2007  .  OBSTRUCTIVE SLEEP APNEA 12/03/2007  . Restless leg syndrome 12/03/2007    Past Medical History:  Diagnosis Date  . Abnormal Pap smear of vagina 03/22/2017   ASCUS pap and negative HR HPV.  Patient is on immunosuppressive medication.   . [place] ([place]) [place]   squamous cell carcinoma of thymus  . Fibroid    reason for Hysterectomy  . Multiple myeloma ([place]) 08/2015  . Pain aggravated by activities of daily living    states she pulled something at right sternum in dec 2016  . PONV (postoperative nausea and vomiting)    thinks morphine caused nausea  . Prediabetes   . Urinary incontinence     Past Surgical History:  Procedure Laterality Date  . ABDOMINAL HYSTERECTOMY  1997   TAH--ovaries remain--Dr. Newton Pigg  . THYMECTOMY  1999    Current Outpatient Medications  Medication Sig Dispense Refill  . aspirin EC 81 MG tablet Take 81 mg by mouth daily.    . Calcium Carb-Cholecalciferol (CALCIUM+D3) 600-800 MG-UNIT TABS Take 2 (two) times daily by mouth.    . estradiol (ESTRACE) 0.1 MG/GM vaginal cream Place 1 Applicatorful vaginally at bedtime.    . gabapentin (NEURONTIN) 300 MG capsule Take 2 capsules (600 mg total) at bedtime by mouth. 30 capsule 0  . lenalidomide (REVLIMID) 15 MG capsule Take 1 capsule (15 mg total) by  mouth as directed. Take 1 capsule daily for 21 Days, take 7 Days Off. Auth# [id number] 09/28/17 21 capsule 0  . mirtazapine (REMERON) 15 MG tablet Take 1 tablet (15 mg total) by mouth at bedtime. 30 tablet 1  . MISC NATURAL PRODUCTS PO Take 15 mg by mouth 2 (two) times daily.    . Polyethylene Glycol 3350 (MIRALAX PO) Take 1 packet daily as needed by mouth (constipation).      No current facility-administered medications for this visit.      ALLERGIES: Penicillins and Ampicillin  Family History  Problem Relation Age of Onset  . COPD Father        dec age 72/s-smoked  . Diabetes Brother   . Other Brother        committed suicide age 40  . Hyperlipidemia Mother    . Diabetes Maternal Grandmother     Social History   Socioeconomic History  . Marital status: Married    Spouse name: Not on file  . Number of children: Not on file  . Years of education: Not on file  . Highest education level: Not on file  Social Needs  . Financial resource strain: Not on file  . Food insecurity - worry: Not on file  . Food insecurity - inability: Not on file  . Transportation needs - medical: Not on file  . Transportation needs - non-medical: Not on file  Occupational History  . Not on file  Tobacco Use  . Smoking status: Never Smoker  . Smokeless tobacco: Never Used  Substance and Sexual Activity  . Alcohol use: No    Alcohol/week: 0.0 oz  . Drug use: No  . Sexual activity: No    Partners: Male    Birth control/protection: Surgical    Comment: TAH--ovaries remain  Other Topics Concern  . Not on file  Social History Narrative  . Not on file    Review of Systems  Constitutional: Negative.   HENT: Negative.   Eyes: Negative.   Respiratory: Negative.   Cardiovascular: Negative.   Gastrointestinal: Negative.   Genitourinary: Negative.   Musculoskeletal: Negative.   Skin: Negative.   Neurological: Negative.   Endo/Heme/Allergies: Negative.   Psychiatric/Behavioral: Negative.     PHYSICAL EXAMINATION:    BP 118/60 (BP Location: Right Arm, Patient Position: Sitting, Cuff Size: Normal)   Pulse 72   Resp 14   Wt 129 lb (58.5 kg)   BMI 19.61 kg/m     General appearance: alert, cooperative and appears stated age  Pelvic: External genitalia:  no lesions              Urethra:  normal appearing urethra with no masses, tenderness or lesions              Bartholins and Skenes: normal                 Vagina: the vaginal apex is erythematous, appears friable, some granulation tissue (better than the first time). Granulation tissue treated with silver nitrate. The prior ulceration on the patient's right is gone, the ulceration on the left is healing                 Chaperone was present for exam.  ASSESSMENT Genital prolapse, continued vaginal irritation. The ulcerations are improving laterally, largest area of irritation is at the vaginal cuff. The patient feels this occurs from her vagina rubbing externally when she is prolapsing.    PLAN Will try leaving the pessary in,  remove every 3 days, place estrogen cream and replace the pessary in the am F/U in 2 weeks (will try and have Dr Quincy Simmonds come in the room so she can see where the patient is at, then she will f/u with Dr Quincy Simmonds)   An After Visit Summary was printed and given to the patient.

## 2017-10-08 ENCOUNTER — Telehealth: Payer: Self-pay | Admitting: Obstetrics and Gynecology

## 2017-10-08 ENCOUNTER — Other Ambulatory Visit: Payer: Self-pay | Admitting: Obstetrics and Gynecology

## 2017-10-08 MED ORDER — ESTRADIOL 0.1 MG/GM VA CREA: 1.0000 | TOPICAL_CREAM | VAGINAL | 1 refills | Status: DC

## 2017-10-08 NOTE — Telephone Encounter (Addendum)
Patient's Estrace is listed as a historical med. No new med sent at time of visit. Routing to [facility] for review and advise of medication usage and dosage.

## 2017-10-08 NOTE — Telephone Encounter (Signed)
Patient was seen last week and her estrace was not sent to pharmacy on file.

## 2017-10-08 NOTE — Telephone Encounter (Signed)
She actually is using the estrace cream. A new order was sent.

## 2017-10-09 NOTE — Telephone Encounter (Signed)
Left message at number provided [phone] that rx for Estrace cream has been sent to pharmacy on file and to return call with any questions.  Encounter closed.

## 2017-10-10 ENCOUNTER — Telehealth: Payer: Self-pay | Admitting: Hematology

## 2017-10-10 NOTE — Telephone Encounter (Signed)
Called regarding 2/22 and 4/19

## 2017-10-12 ENCOUNTER — Ambulatory Visit: Payer: 59

## 2017-10-18 ENCOUNTER — Encounter: Payer: Self-pay | Admitting: Obstetrics and Gynecology

## 2017-10-18 ENCOUNTER — Other Ambulatory Visit: Payer: Self-pay

## 2017-10-18 ENCOUNTER — Ambulatory Visit (INDEPENDENT_AMBULATORY_CARE_PROVIDER_SITE_OTHER): Payer: 59 | Admitting: Obstetrics and Gynecology

## 2017-10-18 VITALS — BP 110/78 | HR 80 | Resp 14 | Wt 128.0 lb

## 2017-10-18 DIAGNOSIS — N898 Other specified noninflammatory disorders of vagina: Secondary | ICD-10-CM

## 2017-10-18 DIAGNOSIS — N899 Noninflammatory disorder of vagina, unspecified: Secondary | ICD-10-CM

## 2017-10-18 DIAGNOSIS — Z4689 Encounter for fitting and adjustment of other specified devices: Secondary | ICD-10-CM | POA: Diagnosis not present

## 2017-10-18 DIAGNOSIS — L929 Granulomatous disorder of the skin and subcutaneous tissue, unspecified: Secondary | ICD-10-CM

## 2017-10-18 DIAGNOSIS — T8389XA Other specified complication of genitourinary prosthetic devices, implants and grafts, initial encounter: Secondary | ICD-10-CM

## 2017-10-18 NOTE — Progress Notes (Signed)
GYNECOLOGY  VISIT   HPI: 62 y.o.   Married  Caucasian  female   G2P2002 with No LMP recorded. Patient has had a hysterectomy.   here for follow up vaginal prolapse.  The patient has continued vaginal irritation with her pessary, the size was changed which improved vaginal ulcerations, but she still had granulation tissue at the vaginal cuff. She felt the cuff irritation was worsened by leaving her pessary out.  She has been taking the pessary out at night every 3 days, placing the estrogen cream and replacing the pessary in the am. The pessary is comfortable, no bleeding or abnormal d/c.   GYNECOLOGIC HISTORY: No LMP recorded. Patient has had a hysterectomy. Contraception:hysterectomy  Menopausal hormone therapy: estradiol         OB History    Gravida Para Term Preterm AB Living   2 2 2     2    SAB TAB Ectopic Multiple Live Births                     Patient Active Problem List   Diagnosis Date Noted  . Urinary tract infection without hematuria   . Muscular fasciculation   . Other fatigue   . Abdominal pain   . FTT (failure to thrive) in adult 08/09/2017  . Counseling regarding advanced care planning and goals of care 08/03/2017  . Gastroesophageal reflux disease without esophagitis 01/17/2017  . S/P autologous bone marrow transplantation ([place]) 03/29/2016  . Near syncope 01/04/2016  . Peripheral edema 12/19/2015  . Hematuria 11/08/2015  . Rash 11/01/2015  . Multiple myeloma ([place]) 10/19/2015  . Metastatic multiple myeloma to bone ([place]) 10/19/2015  . Plasma cell dyscrasia 10/05/2015  . Anemia 09/29/2015  . Hypergammaglobulinemia 09/29/2015  . HYPOXEMIA 12/16/2007  . HYPERLIPIDEMIA 12/04/2007  . DYSPNEA 12/04/2007  . MALIGNANT NEOPLASM OF THYMUS 12/03/2007  . OBSTRUCTIVE SLEEP APNEA 12/03/2007  . Restless leg syndrome 12/03/2007    Past Medical History:  Diagnosis Date  . Abnormal Pap smear of vagina 03/22/2017   ASCUS pap and negative HR HPV.  Patient is on  immunosuppressive medication.   . Cancer ([place]) 1999   squamous cell carcinoma of thymus  . Fibroid    reason for Hysterectomy  . Multiple myeloma ([place]) 08/2015  . Pain aggravated by activities of daily living    states she pulled something at right sternum in dec 2016  . PONV (postoperative nausea and vomiting)    thinks morphine caused nausea  . Prediabetes   . Urinary incontinence     Past Surgical History:  Procedure Laterality Date  . ABDOMINAL HYSTERECTOMY  1997   TAH--ovaries remain--Dr. Newton Pigg  . THYMECTOMY  1999    Current Outpatient Medications  Medication Sig Dispense Refill  . aspirin EC 81 MG tablet Take 81 mg by mouth daily.    . Calcium Carb-Cholecalciferol (CALCIUM+D3) 600-800 MG-UNIT TABS Take 2 (two) times daily by mouth.    . estradiol (ESTRACE) 0.1 MG/GM vaginal cream Place 1 Applicatorful vaginally 2 (two) times a week. At hs 42.5 g 1  . lenalidomide (REVLIMID) 15 MG capsule Take 1 capsule (15 mg total) by mouth as directed. Take 1 capsule daily for 21 Days, take 7 Days Off. Auth# [id number] 09/28/17 21 capsule 0  . mirtazapine (REMERON) 15 MG tablet Take 1 tablet (15 mg total) by mouth at bedtime. 30 tablet 1  . MISC NATURAL PRODUCTS PO Take 15 mg by mouth 2 (two) times daily.    Marland Kitchen  Polyethylene Glycol 3350 (MIRALAX PO) Take 1 packet daily as needed by mouth (constipation).      No current facility-administered medications for this visit.      ALLERGIES: Penicillins and Ampicillin  Family History  Problem Relation Age of Onset  . COPD Father        dec age 29/s-smoked  . Diabetes Brother   . Other Brother        committed suicide age 65  . Hyperlipidemia Mother   . Diabetes Maternal Grandmother     Social History   Socioeconomic History  . Marital status: Married    Spouse name: Not on file  . Number of children: Not on file  . Years of education: Not on file  . Highest education level: Not on file  Social Needs  . Financial resource  strain: Not on file  . Food insecurity - worry: Not on file  . Food insecurity - inability: Not on file  . Transportation needs - medical: Not on file  . Transportation needs - non-medical: Not on file  Occupational History  . Not on file  Tobacco Use  . Smoking status: Never Smoker  . Smokeless tobacco: Never Used  Substance and Sexual Activity  . Alcohol use: No    Alcohol/week: 0.0 oz  . Drug use: No  . Sexual activity: No    Partners: Male    Birth control/protection: Surgical    Comment: TAH--ovaries remain  Other Topics Concern  . Not on file  Social History Narrative  . Not on file    Review of Systems  Constitutional: Negative.   HENT: Negative.   Eyes: Negative.   Respiratory: Negative.   Cardiovascular: Negative.   Gastrointestinal: Negative.   Genitourinary: Negative.   Musculoskeletal: Negative.   Skin: Negative.   Neurological: Negative.   Endo/Heme/Allergies: Negative.   Psychiatric/Behavioral: Negative.     PHYSICAL EXAMINATION:    BP 110/78 (BP Location: Right Arm, Patient Position: Sitting, Cuff Size: Normal)   Pulse 80   Resp 14   Wt 128 lb (58.1 kg)   BMI 19.46 kg/m     General appearance: alert, cooperative and appears stated age              Urethra:  normal appearing urethra with no masses, tenderness or lesions              Bartholins and Skenes: normal                 Vagina: the lateral vaginal ulcerations have resolved. She continues to have a line of granulation tissue across the cuff of the vagina. Not as prominent as when she was seen initially. Treated with silver nitrate.               Cervix:absent  Chaperone was present for exam.  ASSESSMENT Genital prolapse helped with the pessary Vaginal irritation from the pessary, the ulcerations have resolved, still with granulation tissue at the apex, seems slightly better.     PLAN Granulation tissue treated with silver nitrate Continue with taking the pessary out overnight every  3rd night and using estrogen cream those nights F/U in 2 weeks, will try and have Dr Quincy Simmonds come in the room to see her progress so she can take over her care.    An After Visit Summary was printed and given to the patient.

## 2017-10-24 ENCOUNTER — Telehealth: Payer: Self-pay

## 2017-10-24 NOTE — Telephone Encounter (Signed)
Received call from [place], [facility] who was notified of an expiring authorization number for a revlimid prescription ordered 09/28/17 that has not yet been processed. Per [name], pt is not receiving any more revlimid at this time until pt is re-evaluated on next OV visit (11/16/17). Denyse Amass, [facility] aware.

## 2017-11-01 ENCOUNTER — Other Ambulatory Visit: Payer: Self-pay

## 2017-11-01 ENCOUNTER — Encounter: Payer: Self-pay | Admitting: Obstetrics and Gynecology

## 2017-11-01 ENCOUNTER — Ambulatory Visit (INDEPENDENT_AMBULATORY_CARE_PROVIDER_SITE_OTHER): Payer: 59 | Admitting: Obstetrics and Gynecology

## 2017-11-01 VITALS — BP 122/80 | HR 72 | Resp 14 | Wt 130.0 lb

## 2017-11-01 DIAGNOSIS — N898 Other specified noninflammatory disorders of vagina: Secondary | ICD-10-CM

## 2017-11-01 DIAGNOSIS — T8389XA Other specified complication of genitourinary prosthetic devices, implants and grafts, initial encounter: Secondary | ICD-10-CM

## 2017-11-01 DIAGNOSIS — N899 Noninflammatory disorder of vagina, unspecified: Secondary | ICD-10-CM | POA: Diagnosis not present

## 2017-11-01 DIAGNOSIS — Z4689 Encounter for fitting and adjustment of other specified devices: Secondary | ICD-10-CM

## 2017-11-01 NOTE — Progress Notes (Signed)
GYNECOLOGY  VISIT   HPI: 62 y.o.   Married  Caucasian  female   G2P2002 with No LMP recorded. Patient has had a hysterectomy.   here for follow up pessary check. The patient has had continued vaginal irritation from her pessary. The size of the pessary was changed which caused resolution of lateral vaginal ulcerations, but she has still issues with granulation tissue at the cuff. She felt the irritation was worsened by leaving her pessary out and having her vagina rub externally. Over the last 2 weeks she has been taking the pessary out every 3 nights when she uses the estrogen cream and then replaces the pessary in the am.    GYNECOLOGIC HISTORY: No LMP recorded. Patient has had a hysterectomy. Contraception:hysterectomy  Menopausal hormone therapy: none         OB History    Gravida Para Term Preterm AB Living   2 2 2     2    SAB TAB Ectopic Multiple Live Births                     Patient Active Problem List   Diagnosis Date Noted  . Urinary tract infection without hematuria   . Muscular fasciculation   . Other fatigue   . Abdominal pain   . FTT (failure to thrive) in adult 08/09/2017  . Counseling regarding advanced care planning and goals of care 08/03/2017  . Gastroesophageal reflux disease without esophagitis 01/17/2017  . S/P autologous bone marrow transplantation ([place]) 03/29/2016  . Near syncope 01/04/2016  . Peripheral edema 12/19/2015  . Hematuria 11/08/2015  . Rash 11/01/2015  . Multiple myeloma ([place]) 10/19/2015  . Metastatic multiple myeloma to bone ([place]) 10/19/2015  . Plasma cell dyscrasia 10/05/2015  . Anemia 09/29/2015  . Hypergammaglobulinemia 09/29/2015  . HYPOXEMIA 12/16/2007  . HYPERLIPIDEMIA 12/04/2007  . DYSPNEA 12/04/2007  . MALIGNANT NEOPLASM OF THYMUS 12/03/2007  . OBSTRUCTIVE SLEEP APNEA 12/03/2007  . Restless leg syndrome 12/03/2007    Past Medical History:  Diagnosis Date  . Abnormal Pap smear of vagina 03/22/2017   ASCUS pap and negative  HR HPV.  Patient is on immunosuppressive medication.   . Cancer ([place]) 1999   squamous cell carcinoma of thymus  . Fibroid    reason for Hysterectomy  . Multiple myeloma ([place]) 08/2015  . Pain aggravated by activities of daily living    states she pulled something at right sternum in dec 2016  . PONV (postoperative nausea and vomiting)    thinks morphine caused nausea  . Prediabetes   . Urinary incontinence     Past Surgical History:  Procedure Laterality Date  . ABDOMINAL HYSTERECTOMY  1997   TAH--ovaries remain--Dr. Newton Pigg  . THYMECTOMY  1999    Current Outpatient Medications  Medication Sig Dispense Refill  . aspirin EC 81 MG tablet Take 81 mg by mouth daily.    . Calcium Carb-Cholecalciferol (CALCIUM+D3) 600-800 MG-UNIT TABS Take 2 (two) times daily by mouth.    . estradiol (ESTRACE) 0.1 MG/GM vaginal cream Place 1 Applicatorful vaginally 2 (two) times a week. At hs 42.5 g 1  . lenalidomide (REVLIMID) 15 MG capsule Take 1 capsule (15 mg total) by mouth as directed. Take 1 capsule daily for 21 Days, take 7 Days Off. Auth# [id number] 09/28/17 21 capsule 0  . mirtazapine (REMERON) 15 MG tablet Take 1 tablet (15 mg total) by mouth at bedtime. 30 tablet 1  . MISC NATURAL PRODUCTS PO Take 15 mg  by mouth 2 (two) times daily.    . Polyethylene Glycol 3350 (MIRALAX PO) Take 1 packet daily as needed by mouth (constipation).      No current facility-administered medications for this visit.      ALLERGIES: Penicillins and Ampicillin  Family History  Problem Relation Age of Onset  . COPD Father        dec age 14/s-smoked  . Diabetes Brother   . Other Brother        committed suicide age 76  . Hyperlipidemia Mother   . Diabetes Maternal Grandmother     Social History   Socioeconomic History  . Marital status: Married    Spouse name: Not on file  . Number of children: Not on file  . Years of education: Not on file  . Highest education level: Not on file  Social Needs  .  Financial resource strain: Not on file  . Food insecurity - worry: Not on file  . Food insecurity - inability: Not on file  . Transportation needs - medical: Not on file  . Transportation needs - non-medical: Not on file  Occupational History  . Not on file  Tobacco Use  . Smoking status: Never Smoker  . Smokeless tobacco: Never Used  Substance and Sexual Activity  . Alcohol use: No    Alcohol/week: 0.0 oz  . Drug use: No  . Sexual activity: No    Partners: Male    Birth control/protection: Surgical    Comment: TAH--ovaries remain  Other Topics Concern  . Not on file  Social History Narrative  . Not on file    Review of Systems  Constitutional: Negative.   HENT: Negative.   Eyes: Negative.   Respiratory: Negative.   Cardiovascular: Negative.   Gastrointestinal: Negative.   Genitourinary: Negative.   Musculoskeletal: Negative.   Skin: Negative.   Neurological: Negative.   Endo/Heme/Allergies: Negative.   Psychiatric/Behavioral: Negative.     PHYSICAL EXAMINATION:    BP 122/80 (BP Location: Right Arm, Patient Position: Sitting, Cuff Size: Normal)   Pulse 72   Resp 14   Wt 130 lb (59 kg)   BMI 19.77 kg/m     General appearance: alert, cooperative and appears stated age   Pelvic: External genitalia:  no lesions              Urethra:  normal appearing urethra with no masses, tenderness or lesions              Bartholins and Skenes: normal                 Vagina: the pessary was removed and cleaned. She has an approximately 4 x 3 cm area at the vaginal cuff that is erythematous and friable, not the same level of granulation tissue as previously (Dr Quincy Simmonds visualized the cuff as well). Pessary was replaced.              Cervix: absent              Chaperone was present for exam.  ASSESSMENT Pessary check Vaginal irritation from the pessary, appears slightly better    PLAN Will take her pessary out 3 x a week, use estrogen cream those nights F/U with Dr Quincy Simmonds  in 2 weeks   An After Visit Summary was printed and given to the patient.

## 2017-11-02 ENCOUNTER — Other Ambulatory Visit: Payer: Self-pay | Admitting: Hematology

## 2017-11-02 DIAGNOSIS — C9000 Multiple myeloma not having achieved remission: Secondary | ICD-10-CM

## 2017-11-12 ENCOUNTER — Emergency Department (HOSPITAL_COMMUNITY): Payer: 59

## 2017-11-12 ENCOUNTER — Other Ambulatory Visit: Payer: Self-pay

## 2017-11-12 ENCOUNTER — Encounter (HOSPITAL_COMMUNITY): Payer: Self-pay

## 2017-11-12 DIAGNOSIS — Z79899 Other long term (current) drug therapy: Secondary | ICD-10-CM | POA: Diagnosis not present

## 2017-11-12 DIAGNOSIS — E785 Hyperlipidemia, unspecified: Secondary | ICD-10-CM | POA: Diagnosis not present

## 2017-11-12 DIAGNOSIS — Z7982 Long term (current) use of aspirin: Secondary | ICD-10-CM | POA: Insufficient documentation

## 2017-11-12 DIAGNOSIS — F801 Expressive language disorder: Secondary | ICD-10-CM | POA: Diagnosis present

## 2017-11-12 DIAGNOSIS — G43809 Other migraine, not intractable, without status migrainosus: Secondary | ICD-10-CM | POA: Insufficient documentation

## 2017-11-12 LAB — COMPREHENSIVE METABOLIC PANEL
ALBUMIN: 3.9 g/dL (ref 3.5–5.0)
ALK PHOS: 66 U/L (ref 38–126)
ALT: 16 U/L (ref 14–54)
ANION GAP: 11 (ref 5–15)
AST: 16 U/L (ref 15–41)
BUN: 20 mg/dL (ref 6–20)
CALCIUM: 10.3 mg/dL (ref 8.9–10.3)
CO2: 23 mmol/L (ref 22–32)
Chloride: 104 mmol/L (ref 101–111)
Creatinine, Ser: 0.82 mg/dL (ref 0.44–1.00)
GFR calc non Af Amer: >60 mL/min (ref >60)
GLUCOSE: 95 mg/dL (ref 65–99)
POTASSIUM: 4.3 mmol/L (ref 3.5–5.1)
SODIUM: 138 mmol/L (ref 135–145)
Total Bilirubin: 0.8 mg/dL (ref 0.3–1.2)
Total Protein: 7.7 g/dL (ref 6.5–8.1)

## 2017-11-12 LAB — DIFFERENTIAL
Basophils Absolute: 0 10*3/uL (ref 0.0–0.1)
Basophils Relative: 0 %
EOS ABS: 0.1 10*3/uL (ref 0.0–0.7)
EOS PCT: 2 %
LYMPHS PCT: 21 %
Lymphs Abs: 1.4 10*3/uL (ref 0.7–4.0)
MONO ABS: 0.4 10*3/uL (ref 0.1–1.0)
Monocytes Relative: 6 %
NEUTROS PCT: 71 %
Neutro Abs: 4.9 10*3/uL (ref 1.7–7.7)

## 2017-11-12 LAB — CBC
HCT: 36.9 % (ref 36.0–46.0)
Hemoglobin: 12.1 g/dL (ref 12.0–15.0)
MCH: 30.7 pg (ref 26.0–34.0)
MCHC: 32.8 g/dL (ref 30.0–36.0)
MCV: 93.7 fL (ref 78.0–100.0)
PLATELETS: 376 10*3/uL (ref 150–400)
RBC: 3.94 MIL/uL (ref 3.87–5.11)
RDW: 13.6 % (ref 11.5–15.5)
WBC: 6.9 10*3/uL (ref 4.0–10.5)

## 2017-11-12 LAB — I-STAT CHEM 8, ED
BUN: 27 mg/dL — ABNORMAL HIGH (ref 6–20)
CALCIUM ION: 1.24 mmol/L (ref 1.15–1.40)
Chloride: 102 mmol/L (ref 101–111)
Creatinine, Ser: 0.8 mg/dL (ref 0.44–1.00)
Glucose, Bld: 99 mg/dL (ref 65–99)
HEMATOCRIT: 38 % (ref 36.0–46.0)
HEMOGLOBIN: 12.9 g/dL (ref 12.0–15.0)
Potassium: 4.4 mmol/L (ref 3.5–5.1)
SODIUM: 138 mmol/L (ref 135–145)
TCO2: 28 mmol/L (ref 22–32)

## 2017-11-12 LAB — I-STAT TROPONIN, ED: Troponin i, poc: 0 ng/mL (ref 0.00–0.08)

## 2017-11-12 LAB — PROTIME-INR
INR: 0.94
PROTHROMBIN TIME: 12.5 s (ref 11.4–15.2)

## 2017-11-12 LAB — APTT: aPTT: 28 seconds (ref 24–36)

## 2017-11-12 NOTE — ED Notes (Signed)
Results reviewed

## 2017-11-12 NOTE — ED Notes (Signed)
Pt encouraged to alert nursing staff if symptoms come back such as difficulty speaking, numbness or weakness to a specific side or facial droop.

## 2017-11-12 NOTE — ED Triage Notes (Signed)
Pt presents to the ed with complaints of sudden aphasia at 1700 lasting 45 seconds and has subsided. NIH 0 in triage. Pt denies any pain, hx of complex migraine

## 2017-11-13 ENCOUNTER — Emergency Department (HOSPITAL_COMMUNITY)
Admission: EM | Admit: 2017-11-13 | Discharge: 2017-11-13 | Disposition: A | Discharge status: Home / Self Care | Payer: 59 | Attending: Physician Assistant | Admitting: Physician Assistant

## 2017-11-13 ENCOUNTER — Emergency Department (HOSPITAL_COMMUNITY): Payer: 59

## 2017-11-13 DIAGNOSIS — G43109 Migraine with aura, not intractable, without status migrainosus: Secondary | ICD-10-CM

## 2017-11-13 DIAGNOSIS — G43809 Other migraine, not intractable, without status migrainosus: Secondary | ICD-10-CM

## 2017-11-13 DIAGNOSIS — R4701 Aphasia: Secondary | ICD-10-CM

## 2017-11-13 MED ORDER — GADOBENATE DIMEGLUMINE 529 MG/ML IV SOLN
15.0000 mL | Freq: Once | INTRAVENOUS | Status: AC
  Administered 2017-11-13: 12 mL via INTRAVENOUS

## 2017-11-13 NOTE — ED Notes (Signed)
Patient transported to MRI 

## 2017-11-13 NOTE — ED Provider Notes (Signed)
[facility] EMERGENCY DEPARTMENT Provider Note   CSN: [id number] Arrival date & time: 11/12/17  1734     History   Chief Complaint Chief Complaint  Patient presents with  . Transient Ischemic Attack    HPI Paula Johnson is a 62 y.o. female.  Patient presents with episode of difficulty speaking that happened around 5 PM when she was on the phone with her daughter.  Patient states she knew what she wanted to say but the right words were not coming out.  Her daughter at bedside states that the patient was speaking normal words but they were not making sense.  There is no slurred speech or dysarthria.  This lasted about 1 minute and resolved.  She now feels back to baseline.  Patient feels that this could be related to her history of "visual migraines".  She has had issues with blurry lines in her vision but that are followed by headaches that her ophthalmologist told her were related to migraines.  She had a episode of visual disturbance today a few minutes before her speech disturbance.  She was not having any headache or visual disturbance when the speech disturbance was going on.  Several minutes after the speech disturbance resolved, she developed visual disturbance again with headache similar to previous migraines.  No focal weakness, numbness or tingling.   The history is provided by the patient and a relative.    Past Medical History:  Diagnosis Date  . Abnormal Pap smear of vagina 03/22/2017   ASCUS pap and negative HR HPV.  Patient is on immunosuppressive medication.   . Cancer ([place]) 1999   squamous cell carcinoma of thymus  . Fibroid    reason for Hysterectomy  . Multiple myeloma ([place]) 08/2015  . Pain aggravated by activities of daily living    states she pulled something at right sternum in dec 2016  . PONV (postoperative nausea and vomiting)    thinks morphine caused nausea  . Prediabetes   . Urinary incontinence     Patient Active Problem List   Diagnosis Date Noted  . Urinary tract infection without hematuria   . Muscular fasciculation   . Other fatigue   . Abdominal pain   . FTT (failure to thrive) in adult 08/09/2017  . Counseling regarding advanced care planning and goals of care 08/03/2017  . Gastroesophageal reflux disease without esophagitis 01/17/2017  . S/P autologous bone marrow transplantation ([place]) 03/29/2016  . Near syncope 01/04/2016  . Peripheral edema 12/19/2015  . Hematuria 11/08/2015  . Rash 11/01/2015  . Multiple myeloma ([place]) 10/19/2015  . Metastatic multiple myeloma to bone ([place]) 10/19/2015  . Plasma cell dyscrasia 10/05/2015  . Anemia 09/29/2015  . Hypergammaglobulinemia 09/29/2015  . HYPOXEMIA 12/16/2007  . HYPERLIPIDEMIA 12/04/2007  . DYSPNEA 12/04/2007  . MALIGNANT NEOPLASM OF THYMUS 12/03/2007  . OBSTRUCTIVE SLEEP APNEA 12/03/2007  . Restless leg syndrome 12/03/2007    Past Surgical History:  Procedure Laterality Date  . ABDOMINAL HYSTERECTOMY  1997   TAH--ovaries remain--Dr. Newton Pigg  . THYMECTOMY  1999    OB History    Gravida Para Term Preterm AB Living   2 2 2     2    SAB TAB Ectopic Multiple Live Births                   Home Medications    Prior to Admission medications   Medication Sig Start Date End Date Taking? Authorizing Provider  aspirin EC 81  MG tablet Take 81 mg by mouth daily.    Brunetta Genera, MD  Calcium Carb-Cholecalciferol (CALCIUM+D3) 600-800 MG-UNIT TABS Take 2 (two) times daily by mouth.    [provider]  estradiol (ESTRACE) 0.1 MG/GM vaginal cream Place 1 Applicatorful vaginally 2 (two) times a week. At hs 10/08/17   Salvadore Dom, MD  lenalidomide (REVLIMID) 15 MG capsule Take 1 capsule (15 mg total) by mouth as directed. Take 1 capsule daily for 21 Days, take 7 Days Off. Auth# [id number] 09/28/17 09/28/17   Brunetta Genera, MD  mirtazapine (REMERON) 15 MG tablet Take 1 tablet (15 mg total) by mouth at bedtime. 09/14/17   Brunetta Genera, MD  MISC NATURAL PRODUCTS PO Take 15 mg by mouth 2 (two) times daily.    [provider]  Polyethylene Glycol 3350 (MIRALAX PO) Take 1 packet daily as needed by mouth (constipation).     [provider]    Family History Family History  Problem Relation Age of Onset  . COPD Father        dec age 19/s-smoked  . Diabetes Brother   . Other Brother        committed suicide age 42  . Hyperlipidemia Mother   . Diabetes Maternal Grandmother     Social History Social History   Tobacco Use  . Smoking status: Never Smoker  . Smokeless tobacco: Never Used  Substance Use Topics  . Alcohol use: No    Alcohol/week: 0.0 oz  . Drug use: No     Allergies   Penicillins and Ampicillin   Review of Systems Review of Systems  Constitutional: Negative for activity change, appetite change and fever.  HENT: Negative for congestion and sinus pain.   Eyes: Positive for visual disturbance.  Respiratory: Negative for cough, chest tightness and shortness of breath.   Cardiovascular: Negative for chest pain.  Gastrointestinal: Negative for abdominal pain, nausea and vomiting.  Genitourinary: Negative for dysuria, hematuria, vaginal bleeding and vaginal discharge.  Musculoskeletal: Negative for arthralgias and myalgias.  Skin: Negative for rash.  Neurological: Positive for speech difficulty and headaches. Negative for dizziness, weakness, light-headedness and numbness.  Hematological: Negative for adenopathy.    all other systems are negative except as noted in the HPI and PMH.    Physical Exam Updated Vital Signs BP 116/74   Pulse 95   Temp 98.7 F (37.1 C) (Oral)   Resp 18   Wt 59 kg (130 lb)   SpO2 99%   BMI 19.77 kg/m   Physical Exam  Constitutional: She is oriented to person, place, and time. She appears well-developed and well-nourished. No distress.  HENT:  Head: Normocephalic and atraumatic.  Mouth/Throat: Oropharynx is clear and moist. No  oropharyngeal exudate.  Eyes: Conjunctivae and EOM are normal. Pupils are equal, round, and reactive to light.  Neck: Normal range of motion. Neck supple.  No meningismus.  Cardiovascular: Normal rate, regular rhythm, normal heart sounds and intact distal pulses.  No murmur heard. Pulmonary/Chest: Effort normal and breath sounds normal. No respiratory distress.  Abdominal: Soft. There is no tenderness. There is no rebound and no guarding.  Musculoskeletal: Normal range of motion. She exhibits no edema or tenderness.  Neurological: She is alert and oriented to person, place, and time. No cranial nerve deficit. She exhibits normal muscle tone. Coordination normal.  No ataxia on finger to nose bilaterally. No pronator drift. 5/5 strength throughout. CN 2-12 intact.Equal grip strength. Sensation intact.   Negative  Romberg, no ataxia, normal gait  Skin: Skin is warm.  Psychiatric: She has a normal mood and affect. Her behavior is normal.  Nursing note and vitals reviewed.    ED Treatments / Results  Labs (all labs ordered are listed, but only abnormal results are displayed) Labs Reviewed  I-STAT CHEM 8, ED - Abnormal; Notable for the following components:      Result Value   BUN 27 (*)    All other components within normal limits  PROTIME-INR  APTT  CBC  DIFFERENTIAL  COMPREHENSIVE METABOLIC PANEL  I-STAT TROPONIN, ED  CBG MONITORING, ED    EKG  EKG Interpretation  Date/Time:  Monday November 12 2017 17:49:07 EST Ventricular Rate:  94 PR Interval:  160 QRS Duration: 88 QT Interval:  368 QTC Calculation: 460 R Axis:   76 Text Interpretation:  Normal sinus rhythm Biatrial enlargement Septal infarct , age undetermined Abnormal ECG Rate faster Confirmed by Ezequiel Essex [phone]) on 11/13/2017 2:15:15 AM       Radiology Ct Head Wo Contrast  Result Date: 11/12/2017 CLINICAL DATA:  Sudden slurred speech today. History of multiple myeloma. EXAM: CT HEAD WITHOUT CONTRAST  TECHNIQUE: Contiguous axial images were obtained from the base of the skull through the vertex without intravenous contrast. COMPARISON:  03/21/2017 FINDINGS: Brain: No evidence of acute infarction, hemorrhage, hydrocephalus, extra-axial collection or mass lesion/mass effect. Vascular: No hyperdense vessel or unexpected calcification. Skull: Diffuse osteopenia of the skull with thickening of the diploic space and numerous ill-defined lytic foci noted compatible with history of multiple myeloma. Sinuses/Orbits: Chronic moderate mucosal thickening of the left maxillary sinus with polypoid mucous retention cyst partially imaged in the right maxillary sinus. Moderate ethmoid and sphenoid mucosal thickening. The frontal sinus is clear. Orbits are intact. Other: None IMPRESSION: 1. Redemonstration of multiple myeloma of the skull accounting for thickening of the diploic space within no lytic lucencies noted. 2. Chronic paranasal sinusitis with right maxillary sinus mucous retention cysts and/or polyps. 3. No acute intracranial abnormality. Electronically Signed   By: Ashley Royalty M.D.   On: 11/12/2017 19:11    Procedures Procedures (including critical care time)  Medications Ordered in ED Medications - No data to display   Initial Impression / Assessment and Plan / ED Course  I have reviewed the triage vital signs and the nursing notes.  Pertinent labs & imaging results that were available during my care of the patient were reviewed by me and considered in my medical decision making (see chart for details).    Patient with history of multiple myeloma status post bone marrow transplant presenting with episode of a aphasia that lasted about 1 minute.  It was associated with visual disturbance and headache similar to previous migraines.  She feels back to baseline now and has a nonfocal neurological exam.  CT head obtained in triage shows evidence of.  Patient with possible TIA versus complicated  migraine.  She feels back to baseline at this time.  ABCD 2 score is 2  Discussed with Dr. Leonel Ramsay of neurology.  He agrees that this may represent complicated migraine but patient should be ruled out for stroke with MRI and MRA.  He feels if these are negative patient can go home with neurology follow-up. Recommends continuing 81 mg ASA daily.  MRI pending at shift change.  Final Clinical Impressions(s) / ED Diagnoses   Final diagnoses:  None    ED Discharge Orders    None       Emeli Goguen,  Annie Main, MD 11/13/17 [phone]

## 2017-11-13 NOTE — Discharge Instructions (Addendum)
There is no evidence of stroke. Followup with your doctor and the neurologist. Return to the ED if you develop new or worsening symptoms.   We discussed the results of your MRI, there does not appear to be a stroke, there are some vessel changes but these appear as a normal variant.

## 2017-11-13 NOTE — ED Notes (Signed)
Patient assisted to use the restroom .

## 2017-11-15 NOTE — Progress Notes (Signed)
HEMATOLOGY/ONCOLOGY CLINIC NOTE  Date of Service: 11/16/17  PCP: .[name], MD  CC:  F/u for continue mx of multiple myeloma  CHIEF COMPLAINTS/PURPOSE OF CONSULTATION:  Follow up for multiple myeloma  DIAGNOSIS  IgA lambda R-ISS stage II multiple myeloma with innumerable lytic lesions in the calvarium, lesion in T7 vertebra and multiple lesions in the pelvis.  No significant bone pain at this time. Diagnosed in January 2017.  Current treatment  Restarting on Revlimid 34m po daily 3 weeks on 1 week off (after treatment interruption)   Previous Treatment  Status post Vd x 1 cycle VRd x 6 cycles  HD Melphalan 2077mm2 on 03/22/2016 Autologous HSCT on 03/23/2016 [facility]  -Revlimid maintenance 1561mo daily   INTERVAL HISTORY  [name] here for fu of her multiple myeloma. She notes that she is feeling much better with improved appetite and energy levels. She is working full time.  She notes that she has continued using CBD oil and has been feeling better overall.  Has been taking 7.5mg42m Remeron for sleep at night time. She notes that she would want to go back on the Revlimid  Since her last visit, she was diagnosed with the flu last week and was prescribed medication. The patient had gotten her flu shot. She also had a possible TIA and was seen in the ED on 11/13/2017. She was not on aspirin at that time. The patient adds there was a very short period of time where she was unable to speak clearly. Right before her TIA she had a complex migraine. She has never had to be on medication for her migraines and notes the last time she had one way about a year ago. She continues to use CBD oil and Remeron with relief.   On review of systems, pt denies fever, chills, weight loss, decreased appetite, decreased energy levels, mouth sores and bilateral lower extremity. Denies pain. Pt denies abdominal pain, nausea, vomiting and  bladder and bowel changes.   REVIEW OF SYSTEMS:    .10 Point review of Systems was done is negative except as noted above.   PHYSICAL EXAMINATION: NAD Vitals:   11/16/17 1154  BP: 126/82  Pulse: 79  Resp: 18  Temp: 98 F (36.7 C)  TempSrc: Oral  SpO2: 100%  Weight: 125 lb 3.2 oz (56.8 kg)  Height: 5' 8" (1.727 m)   Filed Weights   11/16/17 1154  Weight: 125 lb 3.2 oz (56.8 kg)   .Body mass index is 19.04 kg/m.  . GEMarland KitchenERAL:alert, in no acute distress and comfortable SKIN: no acute rashes, no significant lesions EYES: conjunctiva are pink and non-injected, sclera anicteric OROPHARYNX: MMM, no exudates, no oropharyngeal erythema or ulceration NECK: supple, no JVD LYMPH:  no palpable lymphadenopathy in the cervical, axillary or inguinal regions LUNGS: clear to auscultation b/l with normal respiratory effort HEART: regular rate & rhythm ABDOMEN:  normoactive bowel sounds , non tender, not distended. Extremity: no pedal edema PSYCH: alert & oriented x 3 with fluent speech NEURO: no focal motor/sensory deficits  MEDICAL HISTORY:   Past Medical History:  Diagnosis Date  . Abnormal Pap smear of vagina 03/22/2017   ASCUS pap and negative HR HPV.  Patient is on immunosuppressive medication.   . Cancer (HCC)[place]   squamous cell carcinoma of thymus  . Fibroid    reason for Hysterectomy  . Migraine   . Multiple myeloma (HCC)[place]/2016  . Multiple  myeloma ([place])   . Pain aggravated by activities of daily living    states she pulled something at right sternum in dec 2016  . PONV (postoperative nausea and vomiting)    thinks morphine caused nausea  . Prediabetes   . Urinary incontinence     SURGICAL HISTORY: Past Surgical History:  Procedure Laterality Date  . ABDOMINAL HYSTERECTOMY  1997   TAH--ovaries remain--Dr. Newton Pigg  . THYMECTOMY  1999    SOCIAL HISTORY: Social History   Socioeconomic History  . Marital status: Married    Spouse name: [name]  .  Number of children: 2  . Years of education: college  . Highest education level: Not on file  Social Needs  . Financial resource strain: Not on file  . Food insecurity - worry: Not on file  . Food insecurity - inability: Not on file  . Transportation needs - medical: Not on file  . 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 Smoking status: Never Smoker  . Smokeless tobacco: Never Used  Substance and Sexual Activity  . Alcohol use: No    Alcohol/week: 0.0 oz  . Drug use: No  . Sexual activity: No    Partners: Male    Birth control/protection: Surgical    Comment: TAH--ovaries remain  Other Topics Concern  . Not on file  Social History Narrative   Lives with husband   Caffeine- coffee, 2 cups daily    FAMILY HISTORY: Family History  Problem Relation Age of Onset  . COPD Father        dec age 71/s-smoked  . Macular degeneration Father   . Diabetes Brother   . Other Brother        committed suicide age 43  . Macular degeneration Brother   . Hyperlipidemia Mother   . Diabetes Maternal Grandmother   . Heart attack Paternal Grandfather     ALLERGIES:  is allergic to ampicillin and penicillins.  MEDICATIONS:  . Current Outpatient Medications:  .  aspirin EC 81 MG tablet, Take 1 tablet (81 mg total) by mouth daily., Disp: , Rfl:  .  Calcium Carb-Cholecalciferol (CALCIUM+D3) 600-800 MG-UNIT TABS, Take 2 (two) times daily by mouth., Disp: , Rfl:  .  estradiol (ESTRACE) 0.1 MG/GM vaginal cream, Place 1 Applicatorful vaginally 2 (two) times a week. At hs, Disp: 42.5 g, Rfl: 1 .  lenalidomide (REVLIMID) 10 MG capsule, Take 10 mg by mouth daily., Disp: , Rfl:  .  mirtazapine (REMERON) 15 MG tablet, Take 1 tablet (15 mg total) by mouth at bedtime. (Patient taking differently: Take 7.5 mg by mouth at bedtime. ), Disp: 30 tablet, Rfl: 1 .  MISC NATURAL PRODUCTS PO, Take 15 mg by mouth 2 (two)  times daily., Disp: , Rfl:  .  Polyethylene Glycol 3350 (MIRALAX PO), Take 1 packet daily as needed by mouth (constipation). , Disp: , Rfl:    LABORATORY DATA:  I have reviewed the data as listed  . CBC Latest Ref Rng & Units 11/16/2017 11/12/2017 11/12/2017  WBC 3.9 - 10.3 K/uL 5.3 - 6.9  Hemoglobin 12.0 - 15.0 g/dL - 12.9 12.1  Hematocrit 34.8 - 46.6 % 39.1 38.0 36.9  Platelets 145 - 400 K/uL 325 - 376    . CMP Latest Ref Rng & Units 11/16/2017 11/12/2017 11/12/2017  Glucose 70 - 140 mg/dL 85 99 95  BUN 7 - 26 mg/dL 14  27(H) 20  Creatinine 0.60 - 1.10 mg/dL 0.79 0.80 0.82  Sodium 136 - 145 mmol/L 142 138 138  Potassium 3.5 - 5.1 mmol/L 4.3 4.4 4.3  Chloride 98 - 109 mmol/L 106 102 104  CO2 22 - 29 mmol/L 27 - 23  Calcium 8.4 - 10.4 mg/dL 10.2 - 10.3  Total Protein 6.4 - 8.3 g/dL 7.9 - 7.7  Total Bilirubin 0.2 - 1.2 mg/dL 0.7 - 0.8  Alkaline Phos 40 - 150 U/L 79 - 66  AST 5 - 34 U/L 14 - 16  ALT 0 - 55 U/L 14 - 16     RADIOGRAPHIC STUDIES: I have personally reviewed the radiological images as listed and agreed with the findings in the report. Ct Head Wo Contrast  Result Date: 11/12/2017 CLINICAL DATA:  Sudden slurred speech today. History of multiple myeloma. EXAM: CT HEAD WITHOUT CONTRAST TECHNIQUE: Contiguous axial images were obtained from the base of the skull through the vertex without intravenous contrast. COMPARISON:  03/21/2017 FINDINGS: Brain: No evidence of acute infarction, hemorrhage, hydrocephalus, extra-axial collection or mass lesion/mass effect. Vascular: No hyperdense vessel or unexpected calcification. Skull: Diffuse osteopenia of the skull with thickening of the diploic space and numerous ill-defined lytic foci noted compatible with history of multiple myeloma. Sinuses/Orbits: Chronic moderate mucosal thickening of the left maxillary sinus with polypoid mucous retention cyst partially imaged in the right maxillary sinus. Moderate ethmoid and sphenoid mucosal  thickening. The frontal sinus is clear. Orbits are intact. Other: None IMPRESSION: 1. Redemonstration of multiple myeloma of the skull accounting for thickening of the diploic space within no lytic lucencies noted. 2. Chronic paranasal sinusitis with right maxillary sinus mucous retention cysts and/or polyps. 3. No acute intracranial abnormality. Electronically Signed   By: Ashley Royalty M.D.   On: 11/12/2017 19:11   Mr Angiogram Head Wo Contrast  Result Date: 11/13/2017 CLINICAL DATA:  Acute presentation with speech disturbance yesterday afternoon. Multiple myeloma. EXAM: MR HEAD WITHOUT CONTRAST MR CIRCLE OF WILLIS WITHOUT CONTRAST MRA OF THE NECK WITHOUT AND WITH CONTRAST TECHNIQUE: Multiplanar, multiecho pulse sequences of the brain, circle of willis and surrounding structures were obtained without intravenous contrast. Angiographic images of the neck were obtained using MRA technique without and with intravenous contrast. CONTRAST:  60m MULTIHANCE GADOBENATE DIMEGLUMINE 529 MG/ML IV SOLN COMPARISON:  CT 11/12/2017 FINDINGS: MR HEAD FINDINGS Brain: Diffusion imaging does not show any acute or subacute infarction. The brainstem and cerebellum are normal. Cerebral hemispheres are normal without small or large vessel infarction of any age. No mass lesion, hemorrhage, hydrocephalus or extra-axial collection. Vascular: Major vessels at the base of the brain show flow. Skull and upper cervical spine: Marrow pattern consistent with the known diagnosis of myeloma. No dominant grossly destructive or extraosseous lesion. Sinuses/Orbits: Mucosal inflammatory changes most notable affecting the maxillary sinuses. Small mastoid effusion on the left. Other: None MR CIRCLE OF WILLIS FINDINGS Both internal carotid arteries are widely patent into the brain. The anterior and middle cerebral vessels are patent without proximal stenosis, aneurysm or vascular malformation. Both vertebral arteries are patent. Left vertebral artery  terminates in PICA. Right vertebral artery supplies the basilar. No basilar stenosis. Posterior circulation branch vessels are patent. Fetal origin of both posterior cerebral arteries. MRA NECK FINDINGS Branching pattern of the brachiocephalic vessels from the arch is normal. No origin stenosis. Mild atherosclerotic irregularity of the proximal left subclavian artery. Both common carotid arteries are widely patent to their respective bifurcation. Both carotid bifurcations are normal without narrowing  or irregularity. Both cervical internal carotid arteries are normal. The right vertebral artery is dominant and is widely patent at its origin and through the cervical region to the basilar. 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 This is a small vessel which may show stenosis at its origin and in the cervical region, but does show patency to the left PICA. IMPRESSION: Normal appearance of the brain itself.  No old or acute infarction. Marrow changes consistent with the patient's known myeloma. No anterior circulation vascular pathology in the neck or head. Fetal origin of both posterior cerebral arteries from the anterior circulation. Diminutive posterior circulation. Right vertebral artery supplies the basilar and its branches. Left vertebral artery is a small vessel which does show patency to left PICA. 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 I think there is stenosis at the left vertebral artery origin and possibly in the cervical course. This would not seem to relate to the current presentation. Electronically Signed   By: Nelson Chimes M.D.   On: 11/13/2017 10:39   Mr Angiogram Neck W Or Wo Contrast  Result Date: 11/13/2017 CLINICAL DATA:  Acute presentation with speech disturbance yesterday afternoon. Multiple myeloma. EXAM: MR HEAD WITHOUT CONTRAST MR CIRCLE OF WILLIS WITHOUT CONTRAST MRA OF THE NECK WITHOUT AND WITH CONTRAST  TECHNIQUE: Multiplanar, multiecho pulse sequences of the brain, circle of willis and surrounding structures were obtained without intravenous contrast. Angiographic images of the neck were obtained using MRA technique without and with intravenous contrast. CONTRAST:  79m MULTIHANCE GADOBENATE DIMEGLUMINE 529 MG/ML IV SOLN COMPARISON:  CT 11/12/2017 FINDINGS: MR HEAD FINDINGS Brain: Diffusion imaging does not show any acute or subacute infarction. The brainstem and cerebellum are normal. Cerebral hemispheres are normal without small or large vessel infarction of any age. No mass lesion, hemorrhage, hydrocephalus or extra-axial collection. Vascular: Major vessels at the base of the brain show flow. Skull and upper cervical spine: Marrow pattern consistent with the known diagnosis of myeloma. No dominant grossly destructive or extraosseous lesion. Sinuses/Orbits: Mucosal inflammatory changes most notable affecting the maxillary sinuses. Small mastoid effusion on the left. Other: None MR CIRCLE OF WILLIS FINDINGS Both internal carotid arteries are widely patent into the brain. The anterior and middle cerebral vessels are patent without proximal stenosis, aneurysm or vascular malformation. Both vertebral arteries are patent. Left vertebral artery terminates in PICA. Right vertebral artery supplies the basilar. No basilar stenosis. Posterior circulation branch vessels are patent. Fetal origin of both posterior cerebral arteries. MRA NECK FINDINGS Branching pattern of the brachiocephalic vessels from the arch is normal. No origin stenosis. Mild atherosclerotic irregularity of the proximal left subclavian artery. Both common carotid arteries are widely patent to their respective bifurcation. Both carotid bifurcations are normal without narrowing or irregularity. Both cervical internal carotid arteries are normal. The right vertebral artery is dominant and is widely patent at its origin and through the cervical region to  the basilar. 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 This is a small vessel which may show stenosis at its origin and in the cervical region, but does show patency to the left PICA. IMPRESSION: Normal appearance of the brain itself.  No old or acute infarction. Marrow changes consistent with the patient's known myeloma. No anterior circulation vascular pathology in the neck or head. Fetal origin of both posterior cerebral arteries from the anterior circulation. Diminutive posterior circulation. Right vertebral artery supplies the basilar  and its branches. Left vertebral artery is a small vessel which does show patency to left PICA. 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 I think there is stenosis at the left vertebral artery origin and possibly in the cervical course. This would not seem to relate to the current presentation. Electronically Signed   By: Nelson Chimes M.D.   On: 11/13/2017 10:39   Mr Brain Wo Contrast  Result Date: 11/13/2017 CLINICAL DATA:  Acute presentation with speech disturbance yesterday afternoon. Multiple myeloma. EXAM: MR HEAD WITHOUT CONTRAST MR CIRCLE OF WILLIS WITHOUT CONTRAST MRA OF THE NECK WITHOUT AND WITH CONTRAST TECHNIQUE: Multiplanar, multiecho pulse sequences of the brain, circle of willis and surrounding structures were obtained without intravenous contrast. Angiographic images of the neck were obtained using MRA technique without and with intravenous contrast. CONTRAST:  93m MULTIHANCE GADOBENATE DIMEGLUMINE 529 MG/ML IV SOLN COMPARISON:  CT 11/12/2017 FINDINGS: MR HEAD FINDINGS Brain: Diffusion imaging does not show any acute or subacute infarction. The brainstem and cerebellum are normal. Cerebral hemispheres are normal without small or large vessel infarction of any age. No mass lesion, hemorrhage, hydrocephalus or extra-axial collection. Vascular: Major vessels at the base of the brain  show flow. Skull and upper cervical spine: Marrow pattern consistent with the known diagnosis of myeloma. No dominant grossly destructive or extraosseous lesion. Sinuses/Orbits: Mucosal inflammatory changes most notable affecting the maxillary sinuses. Small mastoid effusion on the left. Other: None MR CIRCLE OF WILLIS FINDINGS Both internal carotid arteries are widely patent into the brain. The anterior and middle cerebral vessels are patent without proximal stenosis, aneurysm or vascular malformation. Both vertebral arteries are patent. Left vertebral artery terminates in PICA. Right vertebral artery supplies the basilar. No basilar stenosis. Posterior circulation branch vessels are patent. Fetal origin of both posterior cerebral arteries. MRA NECK FINDINGS Branching pattern of the brachiocephalic vessels from the arch is normal. No origin stenosis. Mild atherosclerotic irregularity of the proximal left subclavian artery. Both common carotid arteries are widely patent to their respective bifurcation. Both carotid bifurcations are normal without narrowing or irregularity. Both cervical internal carotid arteries are normal. The right vertebral artery is dominant and is widely patent at its origin and through the cervical region to the basilar. 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 This is a small vessel which may show stenosis at its origin and in the cervical region, but does show patency to the left PICA. IMPRESSION: Normal appearance of the brain itself.  No old or acute infarction. Marrow changes consistent with the patient's known myeloma. No anterior circulation vascular pathology in the neck or head. Fetal origin of both posterior cerebral arteries from the anterior circulation. Diminutive posterior circulation. Right vertebral artery supplies the basilar and its branches. Left vertebral artery is a small vessel which does show patency to left PICA. This  vessel takes an anomalous origin from the proximal left subclavian. I think there is stenosis at the left vertebral artery origin and possibly in the cervical course. This would not seem to relate to the current presentation. Electronically Signed   By: MNelson ChimesM.D.   On: 11/13/2017 10:39    ASSESSMENT & PLAN:   62y.o. caucasian female with  1. H/o Multiple myeloma - currently in remission.  IgA lambda R-ISS stage II multiple myeloma with innumerable lytic lesions in the calvarium, lesion in T7 vertebra and multiple lesions in the pelvis.  No significant bone pain at  this time. Diagnosed in January 2017. s/p treatment as noted above PLAN -no clinical symptoms suggestive of myeloma progression. -cbc, cmp wnl -SPEP and SFLC pending today -patient is back to feeling better and would like to restart her Revlimid. - restarting Revlimid 10 mg 3 weeks on, 1 week off -will continue Zometa q8weeks  2. S/p Progressive persistent fatigue .  Associated with generalized jitteriness ?muscle fasiculations. ?post vaccination symptoms. patient associates this with having recent posttransplant vaccination . No HTN  No rashes.  No focal neurological deficits. Evaluated by neurology as inpatient and EMG/NCS not recommended. Has recently had what appears to be a viral URI. TSH levels within normal limits. CK levels within normal limits.  3.  Poor appetite with early satiety .  Poorly defined middle to upper abdominal discomfort. Imaging and EGD --unrevealing other than mild gastritis  Plan -these symptoms have now resolved. -Patient at this time notes that she is feeling a little better after she started taking CBD oil. Improvement in fatigue and appetite.  - Remeron to try to help with insomnia and appetite. -she will discuss pros vs cons of next round of vaccination with Dr Samule Ohm at [facility] where she is to receive them.  -continue Zometa q8weeks -RTC with Dr Irene Limbo in 8 weeks with labs  .  The total time spent in the appointment was 20 minutes and more than 50% was on counseling and direct patient cares.   Sullivan Lone MD [place] AAHIVMS [facility] Hematology/Oncology Physician [facility]  (Office):       [phone] (Work cell):  [phone] (Fax):           [phone]  This document serves as a record of services personally performed by Sullivan Lone, MD. It was created on his behalf by Margit Banda, a trained medical scribe. The creation of this record is based on the scribe's personal observations and the provider's statements to them.   .I have reviewed the above documentation for accuracy and completeness, and I agree with the above. Brunetta Genera MD MS

## 2017-11-16 ENCOUNTER — Inpatient Hospital Stay: Payer: 59 | Attending: Hematology

## 2017-11-16 ENCOUNTER — Encounter: Payer: Self-pay | Admitting: Obstetrics and Gynecology

## 2017-11-16 ENCOUNTER — Inpatient Hospital Stay (HOSPITAL_BASED_OUTPATIENT_CLINIC_OR_DEPARTMENT_OTHER): Payer: 59 | Admitting: Hematology

## 2017-11-16 ENCOUNTER — Inpatient Hospital Stay: Payer: 59

## 2017-11-16 ENCOUNTER — Encounter: Payer: Self-pay | Admitting: Diagnostic Neuroimaging

## 2017-11-16 ENCOUNTER — Other Ambulatory Visit: Payer: Self-pay

## 2017-11-16 ENCOUNTER — Encounter: Payer: Self-pay | Admitting: Hematology

## 2017-11-16 ENCOUNTER — Ambulatory Visit (INDEPENDENT_AMBULATORY_CARE_PROVIDER_SITE_OTHER): Payer: 59 | Admitting: Obstetrics and Gynecology

## 2017-11-16 ENCOUNTER — Ambulatory Visit (INDEPENDENT_AMBULATORY_CARE_PROVIDER_SITE_OTHER): Payer: 59 | Admitting: Diagnostic Neuroimaging

## 2017-11-16 VITALS — BP 108/66 | HR 80 | Resp 14 | Ht 68.0 in | Wt 126.0 lb

## 2017-11-16 VITALS — BP 126/82 | HR 79 | Temp 98.0°F | Resp 18 | Ht 68.0 in | Wt 125.2 lb

## 2017-11-16 VITALS — BP 116/73 | HR 102 | Ht 68.0 in | Wt 125.4 lb

## 2017-11-16 DIAGNOSIS — G459 Transient cerebral ischemic attack, unspecified: Secondary | ICD-10-CM | POA: Insufficient documentation

## 2017-11-16 DIAGNOSIS — N993 Prolapse of vaginal vault after hysterectomy: Secondary | ICD-10-CM

## 2017-11-16 DIAGNOSIS — Z79899 Other long term (current) drug therapy: Secondary | ICD-10-CM

## 2017-11-16 DIAGNOSIS — K2970 Gastritis, unspecified, without bleeding: Secondary | ICD-10-CM | POA: Diagnosis not present

## 2017-11-16 DIAGNOSIS — C9001 Multiple myeloma in remission: Secondary | ICD-10-CM

## 2017-11-16 DIAGNOSIS — G43611 Persistent migraine aura with cerebral infarction, intractable, with status migrainosus: Secondary | ICD-10-CM

## 2017-11-16 DIAGNOSIS — Z1231 Encounter for screening mammogram for malignant neoplasm of breast: Secondary | ICD-10-CM | POA: Diagnosis not present

## 2017-11-16 DIAGNOSIS — Z1239 Encounter for other screening for malignant neoplasm of breast: Secondary | ICD-10-CM

## 2017-11-16 DIAGNOSIS — G4733 Obstructive sleep apnea (adult) (pediatric): Secondary | ICD-10-CM

## 2017-11-16 DIAGNOSIS — C9000 Multiple myeloma not having achieved remission: Secondary | ICD-10-CM

## 2017-11-16 DIAGNOSIS — I639 Cerebral infarction, unspecified: Secondary | ICD-10-CM | POA: Diagnosis not present

## 2017-11-16 DIAGNOSIS — Z4689 Encounter for fitting and adjustment of other specified devices: Secondary | ICD-10-CM

## 2017-11-16 DIAGNOSIS — G43109 Migraine with aura, not intractable, without status migrainosus: Secondary | ICD-10-CM

## 2017-11-16 DIAGNOSIS — N898 Other specified noninflammatory disorders of vagina: Secondary | ICD-10-CM

## 2017-11-16 DIAGNOSIS — Z9221 Personal history of antineoplastic chemotherapy: Secondary | ICD-10-CM | POA: Diagnosis not present

## 2017-11-16 DIAGNOSIS — Z7189 Other specified counseling: Secondary | ICD-10-CM

## 2017-11-16 LAB — COMPREHENSIVE METABOLIC PANEL
ALK PHOS: 79 U/L (ref 40–150)
ALT: 14 U/L (ref 0–55)
AST: 14 U/L (ref 5–34)
Albumin: 3.9 g/dL (ref 3.5–5.0)
Anion gap: 9 (ref 3–11)
BUN: 14 mg/dL (ref 7–26)
CALCIUM: 10.2 mg/dL (ref 8.4–10.4)
CO2: 27 mmol/L (ref 22–29)
CREATININE: 0.79 mg/dL (ref 0.60–1.10)
Chloride: 106 mmol/L (ref 98–109)
Glucose, Bld: 85 mg/dL (ref 70–140)
Potassium: 4.3 mmol/L (ref 3.5–5.1)
SODIUM: 142 mmol/L (ref 136–145)
TOTAL PROTEIN: 7.9 g/dL (ref 6.4–8.3)
Total Bilirubin: 0.7 mg/dL (ref 0.2–1.2)

## 2017-11-16 LAB — RETICULOCYTES
RBC.: 4.1 MIL/uL (ref 3.70–5.45)
Retic Count, Absolute: 53.3 10*3/uL (ref 33.7–90.7)
Retic Ct Pct: 1.3 % (ref 0.7–2.1)

## 2017-11-16 LAB — CBC WITH DIFFERENTIAL (CANCER CENTER ONLY)
BASOS ABS: 0 10*3/uL (ref 0.0–0.1)
BASOS PCT: 0 %
EOS ABS: 0.1 10*3/uL (ref 0.0–0.5)
Eosinophils Relative: 3 %
HCT: 39.1 % (ref 34.8–46.6)
Hemoglobin: 12.9 g/dL (ref 11.6–15.9)
Lymphocytes Relative: 20 %
Lymphs Abs: 1.1 10*3/uL (ref 0.9–3.3)
MCH: 31.5 pg (ref 25.1–34.0)
MCHC: 33 g/dL (ref 31.5–36.0)
MCV: 95.4 fL (ref 79.5–101.0)
MONOS PCT: 10 %
Monocytes Absolute: 0.5 10*3/uL (ref 0.1–0.9)
NEUTROS PCT: 67 %
NRBC: 0 /100{WBCs}
Neutro Abs: 3.5 10*3/uL (ref 1.5–6.5)
PLATELETS: 325 10*3/uL (ref 145–400)
RBC: 4.1 MIL/uL (ref 3.70–5.45)
RDW: 13.7 % (ref 11.2–14.5)
WBC: 5.3 10*3/uL (ref 3.9–10.3)

## 2017-11-16 MED ORDER — LENALIDOMIDE 10 MG PO CAPS: 10.0000 mg | ORAL_CAPSULE | Freq: Every day | ORAL | 0 refills | Status: DC

## 2017-11-16 MED ORDER — LENALIDOMIDE 10 MG PO CAPS: 10.0000 mg | ORAL_CAPSULE | Freq: Every day | ORAL | 2 refills | Status: DC

## 2017-11-16 MED ORDER — ASPIRIN EC 81 MG PO TBEC: 81.0000 mg | DELAYED_RELEASE_TABLET | Freq: Every day | ORAL | Status: DC

## 2017-11-16 MED ORDER — ZOLEDRONIC ACID 4 MG/100ML IV SOLN
4.0000 mg | Freq: Once | INTRAVENOUS | Status: AC
  Administered 2017-11-16: 4 mg via INTRAVENOUS
  Filled 2017-11-16: qty 100

## 2017-11-16 NOTE — Patient Instructions (Signed)

## 2017-11-16 NOTE — Patient Instructions (Addendum)
Your Plan:  Continue baby aspirin daily for stroke prevention Obtain cholesterol levels at todays appointment with your oncologist. LDL goal <100 Schedule appointment for heart ultrasound Follow up with [place] as needed. Please call if your headaches become more frequent or with any other concerns   Thank you for coming to see [place] at [facility]. I hope we have been able to provide you high quality care today.  You may receive a patient satisfaction survey over the next few weeks. We would appreciate your feedback and comments so that we may continue to improve ourselves and the health of our patients.

## 2017-11-16 NOTE — Progress Notes (Signed)
GYNECOLOGY  VISIT   HPI: 62 y.o.   Married  Caucasian  female   G2P2002 with No LMP recorded. Patient has had a hysterectomy.   here for  Recheck.   Has chronic vaginal ulceration and granulation tissue from pessary use. Using vaginal estrogen 1/2 gram three times a week.  Now using a 4 ring pessary with support.   Coughing a lot due to recent flu. Feels like she is having more vaginal bleeding due to this.  Went to the ER with a possible TIA versus complicated migraine.  Had a brain CT and MRA head and neck - no stroke.  Now will have an ECHO.   States she declines future mammograms due to her lifetime accumulation of radiation.  Had thymectomy, chest wall radiation, and now has multiple myeloma.  GYNECOLOGIC HISTORY: No LMP recorded. Patient has had a hysterectomy. Contraception:  Hysterectomy Menopausal hormone therapy:  Estradiol Last mammogram:  06/23/16 BIRADS 1 negative/density b Last pap smear:   03/22/17 ASCUS w/ Negative HR HPV        OB History    Gravida Para Term Preterm AB Living   2 2 2     2    SAB TAB Ectopic Multiple Live Births                     Patient Active Problem List   Diagnosis Date Noted  . Urinary tract infection without hematuria   . Muscular fasciculation   . Other fatigue   . Abdominal pain   . FTT (failure to thrive) in adult 08/09/2017  . Counseling regarding advanced care planning and goals of care 08/03/2017  . Gastroesophageal reflux disease without esophagitis 01/17/2017  . S/P autologous bone marrow transplantation ([place]) 03/29/2016  . Near syncope 01/04/2016  . Peripheral edema 12/19/2015  . Hematuria 11/08/2015  . Rash 11/01/2015  . Multiple myeloma ([place]) 10/19/2015  . Metastatic multiple myeloma to bone ([place]) 10/19/2015  . Plasma cell dyscrasia 10/05/2015  . Anemia 09/29/2015  . Hypergammaglobulinemia 09/29/2015  . HYPOXEMIA 12/16/2007  . HYPERLIPIDEMIA 12/04/2007  . DYSPNEA 12/04/2007  . MALIGNANT NEOPLASM OF THYMUS  12/03/2007  . OBSTRUCTIVE SLEEP APNEA 12/03/2007  . Restless leg syndrome 12/03/2007    Past Medical History:  Diagnosis Date  . Abnormal Pap smear of vagina 03/22/2017   ASCUS pap and negative HR HPV.  Patient is on immunosuppressive medication.   . Cancer ([place]) 1999   squamous cell carcinoma of thymus  . Fibroid    reason for Hysterectomy  . Migraine   . Multiple myeloma ([place]) 08/2015  . Multiple myeloma ([place])   . Pain aggravated by activities of daily living    states she pulled something at right sternum in dec 2016  . PONV (postoperative nausea and vomiting)    thinks morphine caused nausea  . Prediabetes   . Urinary incontinence     Past Surgical History:  Procedure Laterality Date  . ABDOMINAL HYSTERECTOMY  1997   TAH--ovaries remain--Dr. Newton Pigg  . THYMECTOMY  1999    Current Outpatient Medications  Medication Sig Dispense Refill  . aspirin EC 81 MG tablet Take 1 tablet (81 mg total) by mouth daily.    . Calcium Carb-Cholecalciferol (CALCIUM+D3) 600-800 MG-UNIT TABS Take 2 (two) times daily by mouth.    . estradiol (ESTRACE) 0.1 MG/GM vaginal cream Place 1 Applicatorful vaginally 2 (two) times a week. At hs 42.5 g 1  . lenalidomide (REVLIMID) 10 MG capsule Take 1  capsule (10 mg total) by mouth daily. Authorization #[id number] 28 capsule 0  . mirtazapine (REMERON) 15 MG tablet Take 1 tablet (15 mg total) by mouth at bedtime. (Patient taking differently: Take 7.5 mg by mouth at bedtime. ) 30 tablet 1  . MISC NATURAL PRODUCTS PO Take 15 mg by mouth 2 (two) times daily.    . Polyethylene Glycol 3350 (MIRALAX PO) Take 1 packet daily as needed by mouth (constipation).      No current facility-administered medications for this visit.      ALLERGIES: Ampicillin and Penicillins  Family History  Problem Relation Age of Onset  . COPD Father        dec age 40/s-smoked  . Macular degeneration Father   . Diabetes Brother   . Other Brother        committed suicide age  18  . Macular degeneration Brother   . Hyperlipidemia Mother   . Diabetes Maternal Grandmother   . Heart attack Paternal Grandfather     Social History   Socioeconomic History  . Marital status: Married    Spouse name: [name]  . Number of children: 2  . Years of education: college  . Highest education level: Not on file  Social Needs  . Financial resource strain: Not on file  . Food insecurity - worry: Not on file  . Food insecurity - inability: Not on file  . Transportation needs - medical: Not on file  . 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 Smoking status: Never Smoker  . Smokeless tobacco: Never Used  Substance and Sexual Activity  . Alcohol use: No    Alcohol/week: 0.0 oz  . Drug use: No  . Sexual activity: No    Partners: Male    Birth control/protection: Surgical    Comment: TAH--ovaries remain  Other Topics Concern  . Not on file  Social History Narrative   Lives with husband   Caffeine- coffee, 2 cups daily    ROS:  Pertinent items are noted in HPI.  PHYSICAL EXAMINATION:    BP 108/66 (BP Location: Right Arm, Patient Position: Sitting, Cuff Size: Normal)   Pulse 80   Resp 14   Ht 5' 8"  (1.727 m)   Wt 126 lb (57.2 kg)   BMI 19.16 kg/m     General appearance: alert, cooperative and appears stated age  Pelvic: External genitalia:  no lesions              Urethra:  normal appearing urethra with no masses, tenderness or lesions              Bartholins and Skenes: normal                 Vagina:  Central vaginal vault with 3 - 4 cm ulceration and stringy granulation tissue.  Vaginal sidewalls have normal mucosa.  Stringy granulation tissue treated with AgNO3.              Cervix: absent.  Has vaginal vault prolapse.                Bimanual Exam:  Uterus:  Absent.               Adnexa: no mass, fullness, tenderness  Pessary removed, cleansed, and given to patient  in biobag.           Chaperone was present for exam.  ASSESSMENT  Chronic vaginal mucosal ulceration and vaginal granulation tissue from pessary use. Vault prolapse post hysterectomy.  Breast cancer screening.   PLAN  Keep pessary out for one week.  Estrace 1/2 grm pv at hs for one week.  We discussed the chronic nature of her vaginal mucosal ulceration and potential risk of viscus perforation long term.  We discussed potential surgical reconstruction and ACOG HO on organ prolapse surgery to patient - robotic sacrocolpopexy Consultation with Dr. Isaiah Blakes regarding options for breast cancer screening.  FU 1 week.    An After Visit Summary was printed and given to the patient.  ___25___ minutes face to face time of which over 50% was spent in counseling.

## 2017-11-16 NOTE — Progress Notes (Addendum)
[facility] [address] [place]. [place] [address] [phone]       OFFICE CONSULT NOTE  [name] Date of Birth:  July 09, 1956 Medical Record Number:  [id number]   Reason for Referral: TIA vs complicated migraine  CHIEF COMPLAINT:  Chief Complaint  Patient presents with  . Aphasia    rm 6, New Pt, ED referral, "episode of aphasia x 60 seconds on 11/12/17- preceeded by visual migraine, never happened before"  . Migraine    "rare and visual, not on any medications"    HPI:   Paula Johnson is being seen today for her initial visit in the office for TIA vs complicated migraine on 12/08/15. History obtained from patient and chart review. Reviewed all radiology images and labs personally.   Paula Johnson is a 62 year old female with PMH significant for obstructive sleep apnea and or migraines.  Patient presented with an episode of difficulty speaking while she is on the phone with her daughter.  Patient states she knew what she wanted to say but the gray words were not coming out.  There was no slurred speech or dysarthria.  This episode lasted about 1 minute and then resolved after which she felt back to baseline.  Patient felt as though this could have been related to her history of "visual migraines".  She has had issues with blurry lines in her vision followed by a headache that her ophthalmologist told her related to migraines.  She did have an episode of visual disturbance prior to her speech disturbance.  She denied having a headache or visual disturbance when the speech disturbance was going on.  Several minutes out of the speech disturbance resolved, she developed visual disturbance again with headache similar to previous migraines.  Denied weakness numbness or tingling.  CT and MRI were obtained which were unremarkable for infarcts or hemorrhage.  MRA of head and neck showed possible stenosis of the left vertebral artery origin but but does not related to  symptoms.  2D echo was not performed during admission.  LDL and A1c were not calculated during admission.  Patient was discharged home with all symptoms resolved.   Since discharge, patient denies episodes of aphasia or recurrence of visual migraine.  Patient does state that she has a history of visual migraines for the past 3 years.  She did have one episode of "tunnel vision" with a migraine following in 6th grade.  Patient states she does have occasional headaches where she gets nauseated but these occur less than once per year.  In the past 3 years, he has visual migraines happen rarely, reporting 1-2/year.  Her last migraine prior to this admission, had been over a year ago.  The migraines lasts for approximately 5-45 minutes where she feels a dull headache in no particular location, has photophobia, denies phonophobia and can take ibuprofen which seems to resolve headache.  Patient denies family history of migraines or family history of stroke.  Patient denies personal history of stroke/TIA or stroke/TIA symptoms.  Patient was diagnosed with multiple myeloma in 2017 for which she received a bone marrow transplant and taking Revlimid 10 mg daily.  Oncologist did recommend patient taking 81 mg aspirin daily while on this medication for stroke prevention.  Patient does admit to at times not being compliant with taking the aspirin but is currently compliant with 81 mg of aspirin.  Patient is currently in remission.  Patient does state she has slight numbness in the pads of both  feet.  This has been present for several years.  Amount of numbness has not increased over the years even after the diagnosis of multiple myeloma.  Patient associates numbness with needing a bunionectomy.  Patient does have a history of obstructive sleep apnea which she uses a orthodontic appliance which she is compliant with.   ROS:   14 system review of systems performed and negative with exception of snoring, ringing in ears,  incontinence, decreased energy, snoring and restless leg syndrome  PMH:  Past Medical History:  Diagnosis Date  . Abnormal Pap smear of vagina 03/22/2017   ASCUS pap and negative HR HPV.  Patient is on immunosuppressive medication.   . Cancer ([place]) 1999   squamous cell carcinoma of thymus  . Fibroid    reason for Hysterectomy  . Migraine   . Multiple myeloma ([place]) 08/2015  . Multiple myeloma ([place])   . Pain aggravated by activities of daily living    states she pulled something at right sternum in dec 2016  . PONV (postoperative nausea and vomiting)    thinks morphine caused nausea  . Prediabetes   . Urinary incontinence     PSH:  Past Surgical History:  Procedure Laterality Date  . ABDOMINAL HYSTERECTOMY  1997   TAH--ovaries remain--Dr. Newton Pigg  . THYMECTOMY  1999    Social History:  Social History   Socioeconomic History  . Marital status: Married    Spouse name: [name]  . Number of children: 2  . Years of education: college  . Highest education level: Not on file  Social Needs  . Financial resource strain: Not on file  . Food insecurity - worry: Not on file  . Food insecurity - inability: Not on file  . Transportation needs - medical: Not on file  . 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 Smoking status: Never Smoker  . Smokeless tobacco: Never Used  Substance and Sexual Activity  . Alcohol use: No    Alcohol/week: 0.0 oz  . Drug use: No  . Sexual activity: No    Partners: Male    Birth control/protection: Surgical    Comment: TAH--ovaries remain  Other Topics Concern  . Not on file  Social History Narrative   Lives with husband   Caffeine- coffee, 2 cups daily    Family History:  Family History  Problem Relation Age of Onset  . COPD Father        dec age 62/s-smoked  . Macular degeneration Father   . Diabetes Brother   . Other Brother         committed suicide age 64  . Macular degeneration Brother   . Hyperlipidemia Mother   . Diabetes Maternal Grandmother   . Heart attack Paternal Grandfather     Medications:   Current Outpatient Medications on File Prior to Visit  Medication Sig Dispense Refill  . Calcium Carb-Cholecalciferol (CALCIUM+D3) 600-800 MG-UNIT TABS Take 2 (two) times daily by mouth.    . estradiol (ESTRACE) 0.1 MG/GM vaginal cream Place 1 Applicatorful vaginally 2 (two) times a week. At hs 42.5 g 1  . mirtazapine (REMERON) 15 MG tablet Take 1 tablet (15 mg total) by mouth at bedtime. (Patient taking differently: Take 7.5 mg by mouth at bedtime. ) 30 tablet 1  . MISC NATURAL PRODUCTS PO Take 15 mg by mouth 2 (two) times daily.    . Polyethylene  Glycol 3350 (MIRALAX PO) Take 1 packet daily as needed by mouth (constipation).     Marland Kitchen lenalidomide (REVLIMID) 10 MG capsule Take 10 mg by mouth daily.     No current facility-administered medications on file prior to visit.     Allergies:   Allergies  Allergen Reactions  . Ampicillin Rash  . Penicillins Rash    Has patient had a PCN reaction causing immediate rash, facial/tongue/throat swelling, SOB or lightheadedness with hypotension: Y Has patient had a PCN reaction causing severe rash involving mucus membranes or skin necrosis: Y Has patient had a PCN reaction that required hospitalization: N Has patient had a PCN reaction occurring within the last 10 years: N If all of the above answers are "NO", then may proceed with Cephalosporin use.     Physical Exam  Vitals:   11/16/17 0854  BP: 116/73  Pulse: (!) 102  Weight: 125 lb 6.4 oz (56.9 kg)  Height: _0  (1.727 m)   Body mass index is 19.07 kg/m.  Visual Acuity Screening   Right eye Left eye Both eyes  Without correction:     With correction: 20/30 20/40     General: well developed, elderly Caucasian female, well nourished, seated, in no evident distress Head: head normocephalic and atraumatic.     Neck: supple with no carotid or supraclavicular bruits Cardiovascular: regular rate and rhythm, no murmurs Musculoskeletal: no deformity Skin:  no rash/petichiae Vascular:  Normal pulses all extremities  Neurologic Exam Mental Status: Awake and fully alert. Oriented to place and time. Recent and remote memory intact. Attention span, concentration and fund of knowledge appropriate. Mood and affect appropriate.  Cranial Nerves: Fundoscopic exam reveals sharp disc margins. Pupils equal, briskly reactive to light. Extraocular movements full without nystagmus. Visual fields full to confrontation. Hearing intact. Facial sensation intact. Face, tongue, palate moves normally and symmetrically.  Motor: Normal bulk and tone. Normal strength in all tested extremity muscles. Sensory.: intact to touch , pinprick , position and vibratory sensation.  Coordination: Rapid alternating movements normal in all extremities. Finger-to-nose and heel-to-shin performed accurately bilaterally. Gait and Station: Arises from chair without difficulty. Stance is normal. Gait demonstrates normal stride length and balance . Able to heel, toe and tandem walk without difficulty.  Reflexes: Diminished but symmetric. Toes downgoing.    NIHSS  0 Modified Rankin  1   Diagnostic Data (Labs, Imaging, Testing)  CT HEAD 11/12/17 1. Redemonstration of multiple myeloma of the skull accounting for thickening of the diploic space within no lytic lucencies noted. 2. Chronic paranasal sinusitis with right maxillary sinus mucous retention cysts and/or polyps. 3. No acute intracranial abnormality.  MRI BRAIN 11/13/17 No old or acute infarction  MRA head and neck 11/13/17 No anterior circulation vascular pathology in the neck or head. Fetal origin of both posterior cerebral arteries from the anterior circulation.  Diminutive posterior circulation. Right vertebral artery supplies the basilar and its branches. Left vertebral  artery is a small vessel which does show patency to left PICA. 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 I think there is stenosis at the left vertebral artery origin and possibly in the cervical course. This would not seem to relate to the current Presentation.   ASSESSMENT:  62 y.o. year old female here with TIA versus complicated migraine after episode of aphasia and history of migraine with aura.  Vascular risk factors include multiple myeloma and obstructive sleep apnea.  Patient has no concerns at today's visit and  is not experienced episodes of aphasia or visual migraines since hospital discharge.  Dx: complicated migraine (TIA possible but less likely)  1. Complicated migraine   2. TIA (transient ischemic attack)   3. OBSTRUCTIVE SLEEP APNEA   4. Multiple myeloma in remission ([place])     PLAN:   STROKE PREVENTION - Patient will continue to take aspirin 81 mg daily for stroke prevention - Patient will have lipid panel obtained at today's appointment with her oncologist as as she is already having blood drawn.  Her advised LDL goal is less than 100. - 2D echo was ordered to complete stroke workup.  MIGRAINE WITH AURA - ibuprofen or tylenol as needed - may consider preventative if migraine frequency increases  DISPOSITION - Patient needs to follow-up with [place] only as needed.  Patient was advised that if headaches do become more frequent or have any other concerns she will contact us.  Orders Placed This Encounter  Procedures  . ECHOCARDIOGRAM COMPLETE   Meds ordered this encounter  Medications  . aspirin EC 81 MG tablet    Sig: Take 1 tablet (81 mg total) by mouth daily.    Order Specific Question:   Supervising Provider    Answer:   Penni Bombard [[id number]]   Return if symptoms worsen or fail to improve.   Venancio Poisson, NP and   Penni Bombard, MD 7/58/3074, 6:00 PM Certified in Neurology, Neurophysiology and  Neuroimaging  [facility] [address], [place], [place] [address] [phone]

## 2017-11-19 ENCOUNTER — Other Ambulatory Visit: Payer: Self-pay | Admitting: Pharmacist

## 2017-11-19 ENCOUNTER — Other Ambulatory Visit: Payer: Self-pay

## 2017-11-19 DIAGNOSIS — C9000 Multiple myeloma not having achieved remission: Secondary | ICD-10-CM

## 2017-11-19 LAB — KAPPA/LAMBDA LIGHT CHAINS
Kappa free light chain: 22.1 mg/L — ABNORMAL HIGH (ref 3.3–19.4)
Kappa, lambda light chain ratio: 1.05 (ref 0.26–1.65)
Lambda free light chains: 21.1 mg/L (ref 5.7–26.3)

## 2017-11-19 MED ORDER — LENALIDOMIDE 10 MG PO CAPS: 10.0000 mg | ORAL_CAPSULE | Freq: Every day | ORAL | 0 refills | Status: DC

## 2017-11-19 NOTE — Progress Notes (Signed)
Oral Oncology Pharmacist Encounter  Revlimid prescription e-scribed to [facility] as this is where patient had been receiving this limited distribution medication per insurance requirement.  Oral Oncology Clinic will continue to follow.  Johny Drilling, PharmD, BCPS, BCOP 11/19/2017 9:55 AM Oral Oncology Clinic [phone]

## 2017-11-20 ENCOUNTER — Telehealth: Payer: Self-pay

## 2017-11-20 LAB — MULTIPLE MYELOMA PANEL, SERUM
ALBUMIN/GLOB SERPL: 1.2 (ref 0.7–1.7)
ALPHA 1: 0.2 g/dL (ref 0.0–0.4)
Albumin SerPl Elph-Mcnc: 4 g/dL (ref 2.9–4.4)
Alpha2 Glob SerPl Elph-Mcnc: 0.9 g/dL (ref 0.4–1.0)
B-GLOBULIN SERPL ELPH-MCNC: 1.1 g/dL (ref 0.7–1.3)
Gamma Glob SerPl Elph-Mcnc: 1.3 g/dL (ref 0.4–1.8)
Globulin, Total: 3.5 g/dL (ref 2.2–3.9)
IgA: 354 mg/dL — ABNORMAL HIGH (ref 87–352)
IgG (Immunoglobin G), Serum: 1240 mg/dL (ref 700–1600)
IgM (Immunoglobulin M), Srm: 40 mg/dL (ref 26–217)
Total Protein ELP: 7.5 g/dL (ref 6.0–8.5)

## 2017-11-20 NOTE — Telephone Encounter (Signed)
Added Laci for 4/19 est visit. Per Laci okay. 2/26

## 2017-11-23 ENCOUNTER — Encounter: Payer: Self-pay | Admitting: Obstetrics and Gynecology

## 2017-11-23 ENCOUNTER — Other Ambulatory Visit: Payer: Self-pay

## 2017-11-23 ENCOUNTER — Ambulatory Visit (INDEPENDENT_AMBULATORY_CARE_PROVIDER_SITE_OTHER): Payer: 59 | Admitting: Obstetrics and Gynecology

## 2017-11-23 VITALS — BP 122/78 | HR 76 | Resp 16 | Wt 127.0 lb

## 2017-11-23 DIAGNOSIS — N393 Stress incontinence (female) (male): Secondary | ICD-10-CM | POA: Diagnosis not present

## 2017-11-23 DIAGNOSIS — N898 Other specified noninflammatory disorders of vagina: Secondary | ICD-10-CM

## 2017-11-23 DIAGNOSIS — N993 Prolapse of vaginal vault after hysterectomy: Secondary | ICD-10-CM

## 2017-11-23 NOTE — Progress Notes (Signed)
GYNECOLOGY  VISIT   HPI: 62 y.o.   Married  Caucasian  female   G2P2002 with Patient's last menstrual period was 09/26/1995 (within months).   here for 1 week pessary recheck.  Pessary is out for one week.   States she had a large blood blister on her vaginal wall.  It popped with pressure.  No pain.  Using the estrogen cream.   Difficult to empty her bladder with the pessary out.  Voiding very often.     Occasionally has a leak with a cough or sneeze with the pessary in.  No spontaneous leakage.  Some urgency if pessary is not in. Feels like she is voiding often.   Dr. Isaiah Blakes called her this am.  She will make an appt for a screening and consultation with her about breast cancer screening.   GYNECOLOGIC HISTORY: Patient's last menstrual period was 09/26/1995 (within months). Contraception:  Hysterectomy Menopausal hormone therapy:  Estradiol Last mammogram:   06/23/16 BIRADS 1 negative/density b Last pap smear:   03/22/17 ASCUS w/ Negative HR HPV        OB History    Gravida Para Term Preterm AB Living   2 2 2     2    SAB TAB Ectopic Multiple Live Births                     Patient Active Problem List   Diagnosis Date Noted  . Urinary tract infection without hematuria   . Muscular fasciculation   . Other fatigue   . Abdominal pain   . FTT (failure to thrive) in adult 08/09/2017  . Counseling regarding advanced care planning and goals of care 08/03/2017  . Gastroesophageal reflux disease without esophagitis 01/17/2017  . S/P autologous bone marrow transplantation ([place]) 03/29/2016  . Near syncope 01/04/2016  . Peripheral edema 12/19/2015  . Hematuria 11/08/2015  . Rash 11/01/2015  . Multiple myeloma ([place]) 10/19/2015  . Metastatic multiple myeloma to bone ([place]) 10/19/2015  . Plasma cell dyscrasia 10/05/2015  . Anemia 09/29/2015  . Hypergammaglobulinemia 09/29/2015  . HYPOXEMIA 12/16/2007  . HYPERLIPIDEMIA 12/04/2007  . DYSPNEA 12/04/2007  . MALIGNANT  NEOPLASM OF THYMUS 12/03/2007  . OBSTRUCTIVE SLEEP APNEA 12/03/2007  . Restless leg syndrome 12/03/2007    Past Medical History:  Diagnosis Date  . Abnormal Pap smear of vagina 03/22/2017   ASCUS pap and negative HR HPV.  Patient is on immunosuppressive medication.   . Cancer ([place]) 1999   squamous cell carcinoma of thymus  . Fibroid    reason for Hysterectomy  . Migraine   . Multiple myeloma ([place]) 08/2015  . Multiple myeloma ([place])   . Pain aggravated by activities of daily living    states she pulled something at right sternum in dec 2016  . PONV (postoperative nausea and vomiting)    thinks morphine caused nausea  . Prediabetes   . Urinary incontinence     Past Surgical History:  Procedure Laterality Date  . ABDOMINAL HYSTERECTOMY  1997   TAH--ovaries remain--Dr. Newton Pigg  . THYMECTOMY  1999    Current Outpatient Medications  Medication Sig Dispense Refill  . aspirin EC 81 MG tablet Take 1 tablet (81 mg total) by mouth daily.    . Calcium Carb-Cholecalciferol (CALCIUM+D3) 600-800 MG-UNIT TABS Take 2 (two) times daily by mouth.    . estradiol (ESTRACE) 0.1 MG/GM vaginal cream Place 1 Applicatorful vaginally 2 (two) times a week. At hs 42.5 g 1  . lenalidomide (  REVLIMID) 10 MG capsule Take 1 capsule (10 mg total) by mouth daily. Authorization #[id number] 28 capsule 0  . Magnesium Citrate POWD by Does not apply route daily.    . mirtazapine (REMERON) 15 MG tablet Take 1 tablet (15 mg total) by mouth at bedtime. (Patient taking differently: Take 7.5 mg by mouth at bedtime. ) 30 tablet 1  . MISC NATURAL PRODUCTS PO Take 15 mg by mouth 2 (two) times daily.     No current facility-administered medications for this visit.      ALLERGIES: Ampicillin and Penicillins  Family History  Problem Relation Age of Onset  . COPD Father        dec age 47/s-smoked  . Macular degeneration Father   . Diabetes Brother   . Other Brother        committed suicide age 30  . Macular  degeneration Brother   . Hyperlipidemia Mother   . Diabetes Maternal Grandmother   . Heart attack Paternal Grandfather     Social History   Socioeconomic History  . Marital status: Married    Spouse name: [name]  . Number of children: 2  . Years of education: college  . Highest education level: Not on file  Social Needs  . Financial resource strain: Not on file  . Food insecurity - worry: Not on file  . Food insecurity - inability: Not on file  . Transportation needs - medical: Not on file  . 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 Smoking status: Never Smoker  . Smokeless tobacco: Never Used  Substance and Sexual Activity  . Alcohol use: No    Alcohol/week: 0.0 oz  . Drug use: No  . Sexual activity: No    Partners: Male    Birth control/protection: Surgical    Comment: TAH--ovaries remain  Other Topics Concern  . Not on file  Social History Narrative   Lives with husband   Caffeine- coffee, 2 cups daily    ROS:  Pertinent items are noted in HPI.  PHYSICAL EXAMINATION:    BP 122/78 (BP Location: Right Arm, Patient Position: Sitting, Cuff Size: Normal)   Pulse 76   Resp 16   Wt 127 lb (57.6 kg)   LMP 09/26/1995 (Within Months)   BMI 19.31 kg/m     General appearance: alert, cooperative and appears stated age  Pelvic: External genitalia:  no lesions              Urethra:  normal appearing urethra with no masses, tenderness or lesions              Bartholins and Skenes: normal                 Vagina: 2 cm granulation tissue at vaginal apex.  Tx with AgNO3 very lightly.  Anterior midvaginal wall with a small slit like area a few mm in length.               Cervix:  Absent.                Bimanual Exam:  Uterus:   Absent.               Adnexa: no mass, fullness, tenderness                 Chaperone was present for exam.  ASSESSMENT  Vaginal vault prolapse.  Vaginal  cuff granulation tissue.  Need for breast cancer screening.   PLAN  Continue pessary out for 10 more days.  Restart Premarin cream nightly in 2 days.  We discussed robotic sacrocolpopexy, TVT/cysto, and anterior and posterior colporrhaphy. She has written literature. Urodynamic testing with pessary in.  She will do consultation with Dr. Isaiah Blakes for a good breast cancer monitoring plan.   An After Visit Summary was printed and given to the patient.  __25____ minutes face to face time of which over 50% was spent in counseling.

## 2017-11-26 ENCOUNTER — Other Ambulatory Visit (HOSPITAL_COMMUNITY): Payer: 59

## 2017-11-30 ENCOUNTER — Other Ambulatory Visit: Payer: Self-pay

## 2017-11-30 ENCOUNTER — Ambulatory Visit (HOSPITAL_COMMUNITY): Payer: 59 | Attending: Cardiology

## 2017-11-30 DIAGNOSIS — E785 Hyperlipidemia, unspecified: Secondary | ICD-10-CM | POA: Diagnosis not present

## 2017-11-30 DIAGNOSIS — I253 Aneurysm of heart: Secondary | ICD-10-CM | POA: Insufficient documentation

## 2017-11-30 DIAGNOSIS — I340 Nonrheumatic mitral (valve) insufficiency: Secondary | ICD-10-CM

## 2017-11-30 DIAGNOSIS — G459 Transient cerebral ischemic attack, unspecified: Secondary | ICD-10-CM | POA: Diagnosis not present

## 2017-11-30 DIAGNOSIS — I071 Rheumatic tricuspid insufficiency: Secondary | ICD-10-CM

## 2017-11-30 DIAGNOSIS — G4733 Obstructive sleep apnea (adult) (pediatric): Secondary | ICD-10-CM | POA: Diagnosis not present

## 2017-11-30 DIAGNOSIS — I313 Pericardial effusion (noninflammatory): Secondary | ICD-10-CM | POA: Insufficient documentation

## 2017-11-30 DIAGNOSIS — Z8249 Family history of ischemic heart disease and other diseases of the circulatory system: Secondary | ICD-10-CM | POA: Diagnosis not present

## 2017-11-30 DIAGNOSIS — I351 Nonrheumatic aortic (valve) insufficiency: Secondary | ICD-10-CM

## 2017-11-30 HISTORY — DX: Nonrheumatic mitral (valve) insufficiency: I34.0

## 2017-11-30 HISTORY — DX: Nonrheumatic aortic (valve) insufficiency: I35.1

## 2017-11-30 HISTORY — DX: Rheumatic tricuspid insufficiency: I07.1

## 2017-12-03 ENCOUNTER — Telehealth: Payer: Self-pay | Admitting: Adult Health

## 2017-12-03 ENCOUNTER — Ambulatory Visit (INDEPENDENT_AMBULATORY_CARE_PROVIDER_SITE_OTHER): Payer: 59 | Admitting: Obstetrics and Gynecology

## 2017-12-03 ENCOUNTER — Other Ambulatory Visit: Payer: Self-pay

## 2017-12-03 ENCOUNTER — Encounter: Payer: Self-pay | Admitting: Obstetrics and Gynecology

## 2017-12-03 VITALS — BP 100/60 | HR 68 | Resp 16 | Wt 130.8 lb

## 2017-12-03 DIAGNOSIS — N993 Prolapse of vaginal vault after hysterectomy: Secondary | ICD-10-CM | POA: Diagnosis not present

## 2017-12-03 DIAGNOSIS — N898 Other specified noninflammatory disorders of vagina: Secondary | ICD-10-CM | POA: Diagnosis not present

## 2017-12-03 HISTORY — DX: Other specified noninflammatory disorders of vagina: N89.8

## 2017-12-03 NOTE — Progress Notes (Signed)
GYNECOLOGY  VISIT   HPI: 62 y.o.   Married  Caucasian  female   G2P2002 with Patient's last menstrual period was 09/26/1995 (within months).   here for  10-day recheck.  Using vaginal estrogen cream nightly.  Keeping pessary out due to granulation tissue and vaginal bleeding.   GYNECOLOGIC HISTORY: Patient's last menstrual period was 09/26/1995 (within months). Contraception:  Hysterectomy Menopausal hormone therapy:  Estradiol Last mammogram:  06/23/16 BIRADS 1 negative/density b Last pap smear:    03/22/17 ASCUS w/ Negative HR HPV        OB History    Gravida Para Term Preterm AB Living   2 2 2     2    SAB TAB Ectopic Multiple Live Births                     Patient Active Problem List   Diagnosis Date Noted  . Urinary tract infection without hematuria   . Muscular fasciculation   . Other fatigue   . Abdominal pain   . FTT (failure to thrive) in adult 08/09/2017  . Counseling regarding advanced care planning and goals of care 08/03/2017  . Gastroesophageal reflux disease without esophagitis 01/17/2017  . S/P autologous bone marrow transplantation ([place]) 03/29/2016  . Near syncope 01/04/2016  . Peripheral edema 12/19/2015  . Hematuria 11/08/2015  . Rash 11/01/2015  . Multiple myeloma ([place]) 10/19/2015  . Metastatic multiple myeloma to bone ([place]) 10/19/2015  . Plasma cell dyscrasia 10/05/2015  . Anemia 09/29/2015  . Hypergammaglobulinemia 09/29/2015  . HYPOXEMIA 12/16/2007  . HYPERLIPIDEMIA 12/04/2007  . DYSPNEA 12/04/2007  . MALIGNANT NEOPLASM OF THYMUS 12/03/2007  . OBSTRUCTIVE SLEEP APNEA 12/03/2007  . Restless leg syndrome 12/03/2007    Past Medical History:  Diagnosis Date  . Abnormal Pap smear of vagina 03/22/2017   ASCUS pap and negative HR HPV.  Patient is on immunosuppressive medication.   . Cancer ([place]) 1999   squamous cell carcinoma of thymus  . Fibroid    reason for Hysterectomy  . Migraine   . Multiple myeloma ([place]) 08/2015  . Multiple myeloma  ([place])   . Pain aggravated by activities of daily living    states she pulled something at right sternum in dec 2016  . PONV (postoperative nausea and vomiting)    thinks morphine caused nausea  . Prediabetes   . Urinary incontinence     Past Surgical History:  Procedure Laterality Date  . ABDOMINAL HYSTERECTOMY  1997   TAH--ovaries remain--Dr. Newton Pigg  . THYMECTOMY  1999    Current Outpatient Medications  Medication Sig Dispense Refill  . aspirin EC 81 MG tablet Take 1 tablet (81 mg total) by mouth daily.    . Calcium Carb-Cholecalciferol (CALCIUM+D3) 600-800 MG-UNIT TABS Take 2 (two) times daily by mouth.    . estradiol (ESTRACE) 0.1 MG/GM vaginal cream Place 1 Applicatorful vaginally 2 (two) times a week. At hs 42.5 g 1  . lenalidomide (REVLIMID) 10 MG capsule Take 1 capsule (10 mg total) by mouth daily. Authorization #[id number] 28 capsule 0  . Magnesium Citrate POWD by Does not apply route daily.    . mirtazapine (REMERON) 15 MG tablet Take 1 tablet (15 mg total) by mouth at bedtime. (Patient taking differently: Take 7.5 mg by mouth at bedtime. ) 30 tablet 1  . MISC NATURAL PRODUCTS PO Take 15 mg by mouth 2 (two) times daily.     No current facility-administered medications for this visit.  ALLERGIES: Ampicillin and Penicillins  Family History  Problem Relation Age of Onset  . COPD Father        dec age 34/s-smoked  . Macular degeneration Father   . Diabetes Brother   . Other Brother        committed suicide age 66  . Macular degeneration Brother   . Hyperlipidemia Mother   . Diabetes Maternal Grandmother   . Heart attack Paternal Grandfather     Social History   Socioeconomic History  . Marital status: Married    Spouse name: [name]  . Number of children: 2  . Years of education: college  . Highest education level: Not on file  Social Needs  . Financial resource strain: Not on file  . Food insecurity - worry: Not on file  . Food insecurity -  inability: Not on file  . Transportation needs - medical: Not on file  . 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 Smoking status: Never Smoker  . Smokeless tobacco: Never Used  Substance and Sexual Activity  . Alcohol use: No    Alcohol/week: 0.0 oz  . Drug use: No  . Sexual activity: No    Partners: Male    Birth control/protection: Surgical    Comment: TAH--ovaries remain  Other Topics Concern  . Not on file  Social History Narrative   Lives with husband   Caffeine- coffee, 2 cups daily    ROS:  Pertinent items are noted in HPI.  PHYSICAL EXAMINATION:    BP 100/60 (BP Location: Right Arm, Patient Position: Sitting, Cuff Size: Normal)   Pulse 68   Resp 16   Wt 130 lb 12.8 oz (59.3 kg)   LMP 09/26/1995 (Within Months)   BMI 19.89 kg/m     General appearance: alert, cooperative and appears stated age  Pelvic: External genitalia:  no lesions              Urethra:  normal appearing urethra with no masses, tenderness or lesions              Bartholins and Skenes: normal                 Vagina: 2.5 cm area of thin granulation tissue - pink in central vaginal cuff.                Cervix: absent.                Bimanual Exam:  Uterus:  normal size, contour, position, consistency, mobility, non-tender              Adnexa: no mass, fullness, tenderness           Chaperone was present for exam.  ASSESSMENT  Vaginal vault prolapse following hysterectomy.  Granualation tissue.  Improved.   PLAN  Continue to keep the pessary out.  Use vaginal estradiol cream 1/2 gram 3 times per week. Will leave pessary out.  Follow up in 10 - 14 days.  Patient's goal is to try to use the smaller pessary and avoid surgery if possible at this time.    An After Visit Summary was printed and given to the patient.  __15____ minutes face to face time of which over 50% was spent in counseling.

## 2017-12-03 NOTE — Telephone Encounter (Signed)
Patient is returning your call.  

## 2017-12-03 NOTE — Telephone Encounter (Signed)
Attempted to call patient regarding her 2D echo results. Voicemail left for a call back.

## 2017-12-04 ENCOUNTER — Telehealth: Payer: Self-pay | Admitting: Adult Health

## 2017-12-04 NOTE — Telephone Encounter (Signed)
Contacted patient regarding 2D echo results which were unremarkable. Patient doing well without any TIA or complicated migraine symptoms at this time. Patient advised to call us if she had any further questions or concerns. Patient appreciated call and verbalized understanding.

## 2017-12-07 ENCOUNTER — Other Ambulatory Visit: Payer: Self-pay | Admitting: Hematology

## 2017-12-07 DIAGNOSIS — C9000 Multiple myeloma not having achieved remission: Secondary | ICD-10-CM

## 2017-12-12 ENCOUNTER — Other Ambulatory Visit: Payer: Self-pay | Admitting: *Deleted

## 2017-12-12 DIAGNOSIS — C9000 Multiple myeloma not having achieved remission: Secondary | ICD-10-CM

## 2017-12-12 MED ORDER — LENALIDOMIDE 10 MG PO CAPS: 10.0000 mg | ORAL_CAPSULE | Freq: Every day | ORAL | 0 refills | Status: DC

## 2017-12-14 ENCOUNTER — Ambulatory Visit: Payer: 59

## 2017-12-17 ENCOUNTER — Other Ambulatory Visit: Payer: Self-pay

## 2017-12-17 ENCOUNTER — Ambulatory Visit (INDEPENDENT_AMBULATORY_CARE_PROVIDER_SITE_OTHER): Payer: 59 | Admitting: Obstetrics and Gynecology

## 2017-12-17 ENCOUNTER — Encounter: Payer: Self-pay | Admitting: Obstetrics and Gynecology

## 2017-12-17 VITALS — BP 114/68 | HR 72 | Resp 14 | Ht 68.0 in | Wt 128.6 lb

## 2017-12-17 DIAGNOSIS — N898 Other specified noninflammatory disorders of vagina: Secondary | ICD-10-CM | POA: Diagnosis not present

## 2017-12-17 DIAGNOSIS — N993 Prolapse of vaginal vault after hysterectomy: Secondary | ICD-10-CM | POA: Diagnosis not present

## 2017-12-17 NOTE — Progress Notes (Signed)
GYNECOLOGY  VISIT   HPI: 62 y.o.   Married  Caucasian  female   G2P2002 with Patient's last menstrual period was 09/26/1995 (within months).   here for 10-14 day recheck. Pessary is out due to inflammation and granulation tissue.   No vaginal bleeding or spotting.   GYNECOLOGIC HISTORY: Patient's last menstrual period was 09/26/1995 (within months). Contraception:  Hysterectomy Menopausal hormone therapy:  Estradiol Last mammogram:   06/23/16 BIRADS 1 negative/density b Last pap smear:   03/22/17 ASCUS w/ Negative HR HPV        OB History    Gravida  2   Para  2   Term  2   Preterm      AB      Living  2     SAB      TAB      Ectopic      Multiple      Live Births                 Patient Active Problem List   Diagnosis Date Noted  . Vaginal granulation tissue 12/03/2017  . Urinary tract infection without hematuria   . Muscular fasciculation   . Other fatigue   . Abdominal pain   . FTT (failure to thrive) in adult 08/09/2017  . Counseling regarding advanced care planning and goals of care 08/03/2017  . Gastroesophageal reflux disease without esophagitis 01/17/2017  . S/P autologous bone marrow transplantation ([place]) 03/29/2016  . Near syncope 01/04/2016  . Peripheral edema 12/19/2015  . Hematuria 11/08/2015  . Rash 11/01/2015  . Multiple myeloma ([place]) 10/19/2015  . Metastatic multiple myeloma to bone ([place]) 10/19/2015  . Plasma cell dyscrasia 10/05/2015  . Anemia 09/29/2015  . Hypergammaglobulinemia 09/29/2015  . HYPOXEMIA 12/16/2007  . HYPERLIPIDEMIA 12/04/2007  . DYSPNEA 12/04/2007  . MALIGNANT NEOPLASM OF THYMUS 12/03/2007  . OBSTRUCTIVE SLEEP APNEA 12/03/2007  . Restless leg syndrome 12/03/2007    Past Medical History:  Diagnosis Date  . Abnormal Pap smear of vagina 03/22/2017   ASCUS pap and negative HR HPV.  Patient is on immunosuppressive medication.   . Cancer ([place]) 1999   squamous cell carcinoma of thymus  . Fibroid    reason for  Hysterectomy  . Migraine   . Multiple myeloma ([place]) 08/2015  . Multiple myeloma ([place])   . Pain aggravated by activities of daily living    states she pulled something at right sternum in dec 2016  . PONV (postoperative nausea and vomiting)    thinks morphine caused nausea  . Prediabetes   . Urinary incontinence     Past Surgical History:  Procedure Laterality Date  . ABDOMINAL HYSTERECTOMY  1997   TAH--ovaries remain--Dr. Newton Pigg  . THYMECTOMY  1999    Current Outpatient Medications  Medication Sig Dispense Refill  . aspirin EC 81 MG tablet Take 1 tablet (81 mg total) by mouth daily.    . Calcium Carb-Cholecalciferol (CALCIUM+D3) 600-800 MG-UNIT TABS Take 2 (two) times daily by mouth.    . estradiol (ESTRACE) 0.1 MG/GM vaginal cream Place 1 Applicatorful vaginally 2 (two) times a week. At hs 42.5 g 1  . lenalidomide (REVLIMID) 10 MG capsule Take 1 capsule (10 mg total) by mouth daily. Authorization #[id number] 28 capsule 0  . Magnesium Citrate POWD by Does not apply route daily.    . mirtazapine (REMERON) 15 MG tablet Take 1 tablet (15 mg total) by mouth at bedtime. (Patient taking differently: Take 7.5 mg by  mouth at bedtime. ) 30 tablet 1  . MISC NATURAL PRODUCTS PO Take 15 mg by mouth 2 (two) times daily.     No current facility-administered medications for this visit.      ALLERGIES: Ampicillin and Penicillins  Family History  Problem Relation Age of Onset  . COPD Father        dec age 74/s-smoked  . Macular degeneration Father   . Diabetes Brother   . Other Brother        committed suicide age 30  . Macular degeneration Brother   . Hyperlipidemia Mother   . Diabetes Maternal Grandmother   . Heart attack Paternal Grandfather     Social History   Socioeconomic History  . Marital status: Married    Spouse name: [name]  . Number of children: 2  . Years of education: college  . Highest education level: Not on file  Occupational History    Comment: [employer], [employer]  Social Needs  . Financial resource strain: Not on file  . Food insecurity:    Worry: Not on file    Inability: Not on file  . Transportation needs:    Medical: Not on file    Non-medical: Not on file  Tobacco Use  . Smoking status: Never Smoker  . Smokeless tobacco: Never Used  Substance and Sexual Activity  . Alcohol use: No    Alcohol/week: 0.0 oz  . Drug use: No  . Sexual activity: Not Currently    Partners: Male    Birth control/protection: Surgical    Comment: TAH--ovaries remain  Lifestyle  . Physical activity:    Days per week: Not on file    Minutes per session: Not on file  . Stress: Not on file  Relationships  . Social connections:    Talks on phone: Not on file    Gets together: Not on file    Attends religious service: Not on file    Active member of club or organization: Not on file    Attends meetings of clubs or organizations: Not on file    Relationship status: Not on file  . Intimate partner violence:    Fear of current or ex partner: Not on file    Emotionally abused: Not on file    Physically abused: Not on file    Forced sexual activity: Not on file  Other Topics Concern  . Not on file  Social History Narrative   Lives with husband   Caffeine- coffee, 2 cups daily    ROS:  Pertinent items are noted in HPI.  PHYSICAL EXAMINATION:    BP 114/68 (BP Location: Right Arm, Patient Position: Sitting, Cuff Size: Normal)   Pulse 72   Resp 14   Ht _0  (1.727 m)   Wt 128 lb 9.6 oz (58.3 kg)   LMP 09/26/1995 (Within Months)   BMI 19.55 kg/m     General appearance: alert, cooperative and appears stated age   Pelvic: External genitalia:  no lesions              Urethra:  normal appearing urethra with no masses, tenderness or lesions              Bartholins and Skenes: normal                 Vagina:  1 cm beefy granulation tissue at vaginal apex - tx with AgNO3.  Cervix:  Absent.                 Bimanual Exam:   Uterus:  Absent.               Adnexa: no mass, fullness, tenderness              Chaperone was present for exam.  ASSESSMENT  Post hysterectomy vaginal vault prolapse. Vaginal inflammation/granulation tissue almost completely resolved.   PLAN  Continue to keep pessary out for another 9 days.  I anticipate pessary use again after her next office visit.  Continue vaginal estrogen cream. I am so please with the results of her healing!   An After Visit Summary was printed and given to the patient.  ___15___ minutes face to face time of which over 50% was spent in counseling.

## 2017-12-26 ENCOUNTER — Ambulatory Visit (INDEPENDENT_AMBULATORY_CARE_PROVIDER_SITE_OTHER): Payer: 59 | Admitting: Obstetrics and Gynecology

## 2017-12-26 ENCOUNTER — Other Ambulatory Visit: Payer: Self-pay

## 2017-12-26 ENCOUNTER — Encounter: Payer: Self-pay | Admitting: Obstetrics and Gynecology

## 2017-12-26 VITALS — BP 110/72 | HR 68 | Resp 16 | Ht 68.0 in | Wt 130.0 lb

## 2017-12-26 DIAGNOSIS — N898 Other specified noninflammatory disorders of vagina: Secondary | ICD-10-CM | POA: Diagnosis not present

## 2017-12-26 DIAGNOSIS — N993 Prolapse of vaginal vault after hysterectomy: Secondary | ICD-10-CM | POA: Diagnosis not present

## 2017-12-26 NOTE — Progress Notes (Signed)
GYNECOLOGY  VISIT   HPI: 62 y.o.   Married  Caucasian  female   G2P2002 with Patient's last menstrual period was 09/26/1995 (within months).   here for recheck.  Ready to start using her pessary again.   GYNECOLOGIC HISTORY: Patient's last menstrual period was 09/26/1995 (within months). Contraception:  Hysterectomy Menopausal hormone therapy:  Estradiol Last mammogram:  06/23/16 BIRADS 1 negative/density b Last pap smear:   03/22/17 ASCUS w/ Negative HR HPV        OB History    Gravida  2   Para  2   Term  2   Preterm      AB      Living  2     SAB      TAB      Ectopic      Multiple      Live Births                 Patient Active Problem List   Diagnosis Date Noted  . Vaginal granulation tissue 12/03/2017  . Urinary tract infection without hematuria   . Muscular fasciculation   . Other fatigue   . Abdominal pain   . FTT (failure to thrive) in adult 08/09/2017  . Counseling regarding advanced care planning and goals of care 08/03/2017  . Gastroesophageal reflux disease without esophagitis 01/17/2017  . S/P autologous bone marrow transplantation ([place]) 03/29/2016  . Near syncope 01/04/2016  . Peripheral edema 12/19/2015  . Hematuria 11/08/2015  . Rash 11/01/2015  . Multiple myeloma ([place]) 10/19/2015  . Metastatic multiple myeloma to bone ([place]) 10/19/2015  . Plasma cell dyscrasia 10/05/2015  . Anemia 09/29/2015  . Hypergammaglobulinemia 09/29/2015  . HYPOXEMIA 12/16/2007  . HYPERLIPIDEMIA 12/04/2007  . DYSPNEA 12/04/2007  . MALIGNANT NEOPLASM OF THYMUS 12/03/2007  . OBSTRUCTIVE SLEEP APNEA 12/03/2007  . Restless leg syndrome 12/03/2007    Past Medical History:  Diagnosis Date  . Abnormal Pap smear of vagina 03/22/2017   ASCUS pap and negative HR HPV.  Patient is on immunosuppressive medication.   . Cancer ([place]) 1999   squamous cell carcinoma of thymus  . Fibroid    reason for Hysterectomy  . Migraine   . Multiple myeloma ([place]) 08/2015  .  Multiple myeloma ([place])   . Pain aggravated by activities of daily living    states she pulled something at right sternum in dec 2016  . PONV (postoperative nausea and vomiting)    thinks morphine caused nausea  . Prediabetes   . Urinary incontinence     Past Surgical History:  Procedure Laterality Date  . ABDOMINAL HYSTERECTOMY  1997   TAH--ovaries remain--Dr. Newton Pigg  . THYMECTOMY  1999    Current Outpatient Medications  Medication Sig Dispense Refill  . aspirin EC 81 MG tablet Take 1 tablet (81 mg total) by mouth daily.    . Calcium Carb-Cholecalciferol (CALCIUM+D3) 600-800 MG-UNIT TABS Take 2 (two) times daily by mouth.    . estradiol (ESTRACE) 0.1 MG/GM vaginal cream Place 1 Applicatorful vaginally 2 (two) times a week. At hs 42.5 g 1  . lenalidomide (REVLIMID) 10 MG capsule Take 1 capsule (10 mg total) by mouth daily. Authorization #[id number] 28 capsule 0  . Magnesium Citrate POWD by Does not apply route daily.    . mirtazapine (REMERON) 15 MG tablet Take 1 tablet (15 mg total) by mouth at bedtime. (Patient taking differently: Take 7.5 mg by mouth at bedtime. ) 30 tablet 1  . MISC NATURAL  PRODUCTS PO Take 15 mg by mouth 2 (two) times daily.     No current facility-administered medications for this visit.      ALLERGIES: Ampicillin and Penicillins  Family History  Problem Relation Age of Onset  . COPD Father        dec age 50/s-smoked  . Macular degeneration Father   . Diabetes Brother   . Other Brother        committed suicide age 72  . Macular degeneration Brother   . Hyperlipidemia Mother   . Diabetes Maternal Grandmother   . Heart attack Paternal Grandfather     Social History   Socioeconomic History  . Marital status: Married    Spouse name: [name]  . Number of children: 2  . Years of education: college  . 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 Financial resource  strain: Not on file  . Food insecurity:    Worry: Not on file    Inability: Not on file  . Transportation needs:    Medical: Not on file    Non-medical: Not on file  Tobacco Use  . Smoking status: Never Smoker  . Smokeless tobacco: Never Used  Substance and Sexual Activity  . Alcohol use: No    Alcohol/week: 0.0 oz  . Drug use: No  . Sexual activity: Not Currently    Partners: Male    Birth control/protection: Surgical    Comment: TAH--ovaries remain  Lifestyle  . Physical activity:    Days per week: Not on file    Minutes per session: Not on file  . Stress: Not on file  Relationships  . Social connections:    Talks on phone: Not on file    Gets together: Not on file    Attends religious service: Not on file    Active member of club or organization: Not on file    Attends meetings of clubs or organizations: Not on file    Relationship status: Not on file  . Intimate partner violence:    Fear of current or ex partner: Not on file    Emotionally abused: Not on file    Physically abused: Not on file    Forced sexual activity: Not on file  Other Topics Concern  . Not on file  Social History Narrative   Lives with husband   Caffeine- coffee, 2 cups daily    ROS:  Pertinent items are noted in HPI.  PHYSICAL EXAMINATION:    BP 110/72   Pulse 68   Resp 16   Ht 5' 8" (1.727 m)   Wt 130 lb (59 kg)   LMP 09/26/1995 (Within Months)   BMI 19.77 kg/m     General appearance: alert, cooperative and appears stated age    Pelvic: External genitalia:  no lesions              Urethra:  normal appearing urethra with no masses, tenderness or lesions              Bartholins and Skenes: normal                 Vagina:  Vaginal vault eversion.  1 cm area of granulation tissue at vaginal apex, treated with AgNO3.              Cervix:  Absent.  Bimanual Exam:  Uterus:   Absent.               Adnexa: no mass, fullness, tenderness               Chaperone was present  for exam.  ASSESSMENT  Vaginal vault prolapse following hysterectomy.  Vaginal granulation tissue from prior pessary use.  Almost resolved with taking a break from pessary use.  PLAN  Ok to start using pessary again in 1 -2 days after discharge from AgNO3 stops.  Continue use of vaginal estrogen 1/2 gm pv three times weekly. Recheck in 6 weeks, sooner if has vaginal bleeding.    An After Visit Summary was printed and given to the patient.  __10____ minutes face to face time of which over 50% was spent in counseling.   

## 2018-01-01 ENCOUNTER — Encounter: Payer: Self-pay | Admitting: Diagnostic Neuroimaging

## 2018-01-03 ENCOUNTER — Other Ambulatory Visit: Payer: Self-pay | Admitting: Hematology

## 2018-01-05 ENCOUNTER — Other Ambulatory Visit: Payer: Self-pay | Admitting: Hematology

## 2018-01-05 DIAGNOSIS — C9000 Multiple myeloma not having achieved remission: Secondary | ICD-10-CM

## 2018-01-08 ENCOUNTER — Other Ambulatory Visit: Payer: Self-pay | Admitting: *Deleted

## 2018-01-08 DIAGNOSIS — C9000 Multiple myeloma not having achieved remission: Secondary | ICD-10-CM

## 2018-01-08 MED ORDER — LENALIDOMIDE 10 MG PO CAPS: 10.0000 mg | ORAL_CAPSULE | Freq: Every day | ORAL | 0 refills | Status: DC

## 2018-01-10 ENCOUNTER — Telehealth: Payer: Self-pay | Admitting: Obstetrics and Gynecology

## 2018-01-10 NOTE — Telephone Encounter (Signed)
Patient says she is calling to discuss surgery.

## 2018-01-10 NOTE — Telephone Encounter (Signed)
Spoke with patient, advised as seen below per Dr. Quincy Simmonds. Patient verbalizes understanding and is agreeable. Will close encounter.

## 2018-01-10 NOTE — Telephone Encounter (Signed)
Spoke with patient. Reports pessary replaced 1.5 wks ago, bleeding started again 4/17. Patient has removed the pessary, describes bleeding as spotting, will leave pessary out. Denies any other symptoms. Patient states she has decided to proceed with surgery as discussed on 4/3, has a few additional questions about  surgery.   OV scheduled for 4/22 at 3:45pm with Dr. Quincy Simmonds, advised will review with Dr. Quincy Simmonds and return call with any additional recommendations.   Dr. Quincy Simmonds -please review and advise?

## 2018-01-10 NOTE — Telephone Encounter (Signed)
I can re-examine her at her office visit. Leaving the pessary out will help.  She can continue with the estrogen cream 1/2 gram 3 times per week. We can discuss surgical care at the office visit.

## 2018-01-11 ENCOUNTER — Inpatient Hospital Stay (HOSPITAL_BASED_OUTPATIENT_CLINIC_OR_DEPARTMENT_OTHER): Payer: 59 | Admitting: Nurse Practitioner

## 2018-01-11 ENCOUNTER — Telehealth: Payer: Self-pay | Admitting: Nurse Practitioner

## 2018-01-11 ENCOUNTER — Other Ambulatory Visit: Payer: 59

## 2018-01-11 ENCOUNTER — Ambulatory Visit: Payer: 59

## 2018-01-11 ENCOUNTER — Encounter: Payer: Self-pay | Admitting: Nurse Practitioner

## 2018-01-11 ENCOUNTER — Inpatient Hospital Stay: Payer: 59

## 2018-01-11 ENCOUNTER — Inpatient Hospital Stay: Payer: 59 | Attending: Hematology

## 2018-01-11 VITALS — BP 116/64 | HR 106 | Temp 98.4°F | Resp 18 | Ht 68.0 in | Wt 129.6 lb

## 2018-01-11 DIAGNOSIS — Z79899 Other long term (current) drug therapy: Secondary | ICD-10-CM

## 2018-01-11 DIAGNOSIS — R630 Anorexia: Secondary | ICD-10-CM

## 2018-01-11 DIAGNOSIS — C9001 Multiple myeloma in remission: Secondary | ICD-10-CM

## 2018-01-11 DIAGNOSIS — Z9221 Personal history of antineoplastic chemotherapy: Secondary | ICD-10-CM | POA: Diagnosis not present

## 2018-01-11 DIAGNOSIS — N993 Prolapse of vaginal vault after hysterectomy: Secondary | ICD-10-CM

## 2018-01-11 DIAGNOSIS — Z7189 Other specified counseling: Secondary | ICD-10-CM

## 2018-01-11 DIAGNOSIS — C9000 Multiple myeloma not having achieved remission: Secondary | ICD-10-CM

## 2018-01-11 DIAGNOSIS — R5383 Other fatigue: Secondary | ICD-10-CM

## 2018-01-11 LAB — CBC WITH DIFFERENTIAL (CANCER CENTER ONLY)
Basophils Absolute: 0 10*3/uL (ref 0.0–0.1)
Basophils Relative: 1 %
EOS PCT: 4 %
Eosinophils Absolute: 0.2 10*3/uL (ref 0.0–0.5)
HCT: 38.8 % (ref 34.8–46.6)
HEMOGLOBIN: 12.6 g/dL (ref 11.6–15.9)
LYMPHS ABS: 1 10*3/uL (ref 0.9–3.3)
LYMPHS PCT: 23 %
MCH: 30.5 pg (ref 25.1–34.0)
MCHC: 32.5 g/dL (ref 31.5–36.0)
MCV: 93.9 fL (ref 79.5–101.0)
Monocytes Absolute: 0.6 10*3/uL (ref 0.1–0.9)
Monocytes Relative: 14 %
NEUTROS PCT: 58 %
Neutro Abs: 2.7 10*3/uL (ref 1.5–6.5)
Platelet Count: 302 10*3/uL (ref 145–400)
RBC: 4.13 MIL/uL (ref 3.70–5.45)
RDW: 13.7 % (ref 11.2–14.5)
WBC: 4.6 10*3/uL (ref 3.9–10.3)

## 2018-01-11 LAB — CMP (CANCER CENTER ONLY)
ALT: 34 U/L (ref 0–55)
AST: 16 U/L (ref 5–34)
Albumin: 3.8 g/dL (ref 3.5–5.0)
Alkaline Phosphatase: 84 U/L (ref 40–150)
Anion gap: 7 (ref 3–11)
BUN: 19 mg/dL (ref 7–26)
CHLORIDE: 109 mmol/L (ref 98–109)
CO2: 24 mmol/L (ref 22–29)
Calcium: 9.3 mg/dL (ref 8.4–10.4)
Creatinine: 0.78 mg/dL (ref 0.60–1.10)
GFR, Est AFR Am: >60 mL/min (ref >60)
Glucose, Bld: 144 mg/dL — ABNORMAL HIGH (ref 70–140)
POTASSIUM: 4.1 mmol/L (ref 3.5–5.1)
SODIUM: 140 mmol/L (ref 136–145)
Total Bilirubin: 0.4 mg/dL (ref 0.2–1.2)
Total Protein: 7.8 g/dL (ref 6.4–8.3)

## 2018-01-11 LAB — RETICULOCYTES
RBC.: 4.13 MIL/uL (ref 3.70–5.45)
Retic Count, Absolute: 57.8 10*3/uL (ref 33.7–90.7)
Retic Ct Pct: 1.4 % (ref 0.7–2.1)

## 2018-01-11 MED ORDER — ZOLEDRONIC ACID 4 MG/100ML IV SOLN
4.0000 mg | Freq: Once | INTRAVENOUS | Status: AC
  Administered 2018-01-11: 4 mg via INTRAVENOUS
  Filled 2018-01-11: qty 100

## 2018-01-11 NOTE — Telephone Encounter (Signed)
Appts scheduled 4/19 los

## 2018-01-11 NOTE — Progress Notes (Signed)
[facility]  [phone] [phone]) [phone]  Clinic Follow up Note   Patient Care Team: [name], MD as PCP - General (Family Medicine) 01/11/2018  DIAGNOSIS IgA lambda R-ISS stage II multiple myeloma with innumerable lytic lesions in the calvarium, lesion in T7 vertebra and multiple lesions in the pelvis. No significant bone pain at this time. Diagnosed in January 2017.  Previous Treatment Status post Vd x 1 cycle VRd x 6 cycles HD Melphalan 245m/m2 on 03/22/2016 Autologous HSCT [facility] 03/23/2016 [facility](Dose of 6.4 x10^6/kg) -Revlimid maintenance 19mpo daily   Current treatment Restarted on Revlimid 1061mo daily 3 weeks on 1 week off (after treatment interruption) 11/19/17  INTERVAL HISTORY: Ms. CooFidalgoturns for follow up as scheduled. She completed second cycle of revlimid since restarting after treatment interruption. She tolerated well. Mild to moderate fatigue is stable, she is able to complete all activities but requires some effort. Good appetite and po intake. No nausea, vomiting, or diarrhea. She has well-controlled constipation. Denies fever, chills, cough, dyspnea, leg edema, palpitations, or chest pain. She had a stroke like symptoms that was attributed to complex migraine in 10/2017 but denies further episodes or neurological changes. She hopes to have GYN surgery soon per Dr[facility]incy [facility]s [facility] is not working. Denies new bone pain or other concerns.   REVIEW OF SYSTEMS:   Constitutional: Denies fevers, chills or abnormal weight loss (+) stable mild-moderate fatigue  Eyes: Denies blurriness of vision Ears, nose, mouth, throat, and face: Denies mucositis or sore throat Respiratory: Denies cough, dyspnea or wheezes Cardiovascular: Denies palpitation, chest discomfort or lower extremity swelling Gastrointestinal:  Denies nausea, vomiting, diarrhea, heartburn or change in bowel habits (+) well  controlled constipation Skin: Denies abnormal skin rashes Lymphatics: Denies new lymphadenopathy or easy bruising Neurological:Denies numbness, tingling, new weaknesses, or neuro changes  Behavioral/Psych: Mood is stable, no new changes  MSK: Denies bone pain or new pain  All other systems were reviewed with the patient and are negative.  MEDICAL HISTORY:  Past Medical History:  Diagnosis Date  . Abnormal Pap smear of vagina 03/22/2017   ASCUS pap and negative HR HPV.  Patient is on immunosuppressive medication.   . [id number]   squamous cell carcinoma of thymus  . Fibroid    reason for Hysterectomy  . Migraine   . [place] ([place]  . Multiple [place]oma ([facility] . Pain aggravated by activities of daily living    states she pulled something at right sternum in dec 2016  . PONV (postoperative nausea and vomiting)    thinks morphine caused nausea  . Prediabetes   . Urinary incontinence     SURGICAL HISTORY: Past Surgical History:  Procedure Laterality Date  . ABDOMINAL HYSTERECTOMY  1997   TAH--ovaries remain--Dr. ThoNewton Pigg THYMECTOMY  1999    I have reviewed the social history and family history with the patient and they are unchanged from previous note.  ALLERGIES:  is allergic to ampicillin and penicillins.  MEDICATIONS:  Current Outpatient Medications  Medication Sig Dispense Refill  . aspirin EC 81 MG tablet Take 1 tablet (81 mg total) by mouth daily.    . Calcium Carb-Cholecalciferol (CALCIUM+D3) 600-800 MG-UNIT TABS Take 2 (two) times daily by mouth.    . estradiol (ESTRACE) 0.1 MG/GM vaginal cream Place 1 Applicatorful vaginally 2 (two) times a week. At hs 42.5 g 1  . lenalidomide (REVLIMID) 10 MG  capsule Take 1 capsule (10 mg total) by mouth daily. Authorization #[id number] 21 capsule 0  . Magnesium Citrate POWD by Does not apply route daily.    . mirtazapine (REMERON) 15 MG tablet Take 0.5 tablets (7.5 mg total) by mouth at bedtime. 30  tablet 2  . MISC NATURAL PRODUCTS PO Take 15 mg by mouth 2 (two) times daily.     No current facility-administered medications for this visit.    Facility-Administered Medications Ordered in Other Visits  Medication Dose Route Frequency Provider Last Rate Last Dose  . Zoledronic Acid (ZOMETA) IVPB 4 mg  4 mg Intravenous Once Brunetta Genera, MD        PHYSICAL EXAMINATION: ECOG PERFORMANCE STATUS: 1 - Symptomatic but completely ambulatory  Vitals:   01/11/18 1112  BP: 116/64  Pulse: (!) 106  Resp: 18  Temp: 98.4 F (36.9 C)  SpO2: 97%   Filed Weights   01/11/18 1112  Weight: 129 lb 9.6 oz (58.8 kg)    GENERAL:alert, no distress and comfortable SKIN: skin color, texture, turgor are normal, no rashes or significant lesions EYES: normal, Conjunctiva are pink and non-injected, sclera clear OROPHARYNX:no exudate, no erythema and lips, buccal mucosa, and tongue normal   LYMPH:  no palpable cervical or supraclavicular lymphadenopathy LUNGS: clear to auscultation with normal breathing effort HEART: regular rate & rhythm and no murmurs and no lower extremity edema ABDOMEN:abdomen soft, non-tender and normal bowel sounds Musculoskeletal:no cyanosis of digits and no clubbing  NEURO: alert & oriented x 3 with fluent speech, no focal motor/sensory deficits  LABORATORY DATA:  I have reviewed the data as listed CBC Latest Ref Rng & Units 01/11/2018 11/16/2017 11/12/2017  WBC 3.9 - 10.3 K/uL 4.6 5.3 -  Hemoglobin 12.0 - 15.0 g/dL - - 12.9  Hematocrit 34.8 - 46.6 % 38.8 39.1 38.0  Platelets 145 - 400 K/uL 302 325 -     CMP Latest Ref Rng & Units 01/11/2018 11/16/2017 11/12/2017  Glucose 70 - 140 mg/dL 144(H) 85 99  BUN 7 - 26 mg/dL 19 14 27(H)  Creatinine 0.60 - 1.10 mg/dL 0.78 0.79 0.80  Sodium 136 - 145 mmol/L 140 142 138  Potassium 3.5 - 5.1 mmol/L 4.1 4.3 4.4  Chloride 98 - 109 mmol/L 109 106 102  CO2 22 - 29 mmol/L 24 27 -  Calcium 8.4 - 10.4 mg/dL 9.3 10.2 -  Total  Protein 6.4 - 8.3 g/dL 7.8 7.9 -  Total Bilirubin 0.2 - 1.2 mg/dL 0.4 0.7 -  Alkaline Phos 40 - 150 U/L 84 79 -  AST 5 - 34 U/L 16 14 -  ALT 0 - 55 U/L 34 14 -      RADIOGRAPHIC STUDIES: I have personally reviewed the radiological images as listed and agreed with the findings in the report. No results found.   ASSESSMENT & PLAN:   1. H/o multiple myeloma, in remission -Ms. Glatt appears stable. She restarted revlimid 10 mg 3 weeks on /1 week off on 11/19/17 after treatment interruption; now completed cycle 2. She is tolerating well overall with mild-moderate persistent fatigue. She plans to begin next cycle 4/24. CBC, CMP reviewed and unremarkable. MM labs pending. OK to proceed with next cycle. She does not need a refill.  -She tolerates q8 week zometa, no new dental concerns or recent procedures. She is due today, will continue q8 weeks.  -Return for lab and f/u with [name] in 8 weeks.   2. Vaginal vault prolapse following hysterectomy -  She intends to undergo GYN surgery per Dr. Quincy Simmonds, a surgical date has not been set. Will relay to [name] to determine dosing schedule for revlimid or lab check prior to surgery.  3. Decreased appetite  -she is tolerating 0.5 tablet remeron qHS, she feels it has achieved the goal and is asking how long she needs to remain on this medication. I suggest she can try taking it every other night, if she notices her appetite decreases, I recommend she increase back to nightly. She will try.   PLAN: -Labs reviewed, proceed with next cycle revlimid 10 mg 3 weeks on and 1 week off, start 01/16/18 -Zometa today, continue q8 weeks  -Message to [name] re: GYN surgery -Can try remeron every other night; if appetite decreases, increase back to every night -Lab and f/u [name], and zometa in 8 weeks   All questions were answered. The patient knows to call the clinic with any problems, questions or concerns. No barriers to learning was detected.     Alla Feeling, NP 01/11/18

## 2018-01-14 ENCOUNTER — Encounter: Payer: Self-pay | Admitting: Obstetrics and Gynecology

## 2018-01-14 ENCOUNTER — Ambulatory Visit (INDEPENDENT_AMBULATORY_CARE_PROVIDER_SITE_OTHER): Payer: 59 | Admitting: Obstetrics and Gynecology

## 2018-01-14 ENCOUNTER — Other Ambulatory Visit: Payer: Self-pay

## 2018-01-14 VITALS — BP 100/60 | HR 76 | Resp 16 | Ht 68.0 in | Wt 130.0 lb

## 2018-01-14 DIAGNOSIS — N898 Other specified noninflammatory disorders of vagina: Secondary | ICD-10-CM | POA: Diagnosis not present

## 2018-01-14 DIAGNOSIS — R32 Unspecified urinary incontinence: Secondary | ICD-10-CM

## 2018-01-14 LAB — KAPPA/LAMBDA LIGHT CHAINS
Kappa free light chain: 38.8 mg/L — ABNORMAL HIGH (ref 3.3–19.4)
Kappa, lambda light chain ratio: 1.12 (ref 0.26–1.65)
Lambda free light chains: 34.6 mg/L — ABNORMAL HIGH (ref 5.7–26.3)

## 2018-01-14 NOTE — Progress Notes (Signed)
GYNECOLOGY  VISIT   HPI: 62 y.o.   Married  Caucasian  female   G2P2002 with Patient's last menstrual period was 09/26/1995 (within months).   here for spotting with pessary.  Started bleeding again after the pessary was in for  1.5 - 2 weeks.  No pain.  Pessary out for 5 days.   Considering surgery for obliteration of the vagina.   Has urinary incontinence.   Not sexually active.   GYNECOLOGIC HISTORY: Patient's last menstrual period was 09/26/1995 (within months). Contraception:  Hysterectomy Menopausal hormone therapy:  Estradiol Last mammogram:   06/23/16 BIRADS 1 negative/density b Last pap smear:   03/22/17 ASCUS w/ Negative HR HPV        OB History    Gravida  2   Para  2   Term  2   Preterm      AB      Living  2     SAB      TAB      Ectopic      Multiple      Live Births                 Patient Active Problem List   Diagnosis Date Noted  . Vaginal granulation tissue 12/03/2017  . Urinary tract infection without hematuria   . Muscular fasciculation   . Other fatigue   . Abdominal pain   . FTT (failure to thrive) in adult 08/09/2017  . Counseling regarding advanced care planning and goals of care 08/03/2017  . Gastroesophageal reflux disease without esophagitis 01/17/2017  . S/P autologous bone marrow transplantation ([place]) 03/29/2016  . Near syncope 01/04/2016  . Peripheral edema 12/19/2015  . Hematuria 11/08/2015  . Rash 11/01/2015  . Multiple myeloma ([place]) 10/19/2015  . Metastatic multiple myeloma to bone ([place]) 10/19/2015  . Plasma cell dyscrasia 10/05/2015  . Anemia 09/29/2015  . Hypergammaglobulinemia 09/29/2015  . HYPOXEMIA 12/16/2007  . HYPERLIPIDEMIA 12/04/2007  . DYSPNEA 12/04/2007  . MALIGNANT NEOPLASM OF THYMUS 12/03/2007  . OBSTRUCTIVE SLEEP APNEA 12/03/2007  . Restless leg syndrome 12/03/2007    Past Medical History:  Diagnosis Date  . Abnormal Pap smear of vagina 03/22/2017   ASCUS pap and negative HR HPV.  Patient  is on immunosuppressive medication.   . Cancer ([place]) 1999   squamous cell carcinoma of thymus  . Fibroid    reason for Hysterectomy  . Migraine   . Multiple myeloma ([place]) 08/2015  . Multiple myeloma ([place])   . Pain aggravated by activities of daily living    states she pulled something at right sternum in dec 2016  . PONV (postoperative nausea and vomiting)    thinks morphine caused nausea  . Prediabetes   . Urinary incontinence     Past Surgical History:  Procedure Laterality Date  . ABDOMINAL HYSTERECTOMY  1997   TAH--ovaries remain--Dr. Newton Pigg  . THYMECTOMY  1999    Current Outpatient Medications  Medication Sig Dispense Refill  . aspirin EC 81 MG tablet Take 1 tablet (81 mg total) by mouth daily.    . Calcium Carb-Cholecalciferol (CALCIUM+D3) 600-800 MG-UNIT TABS Take 2 (two) times daily by mouth.    . estradiol (ESTRACE) 0.1 MG/GM vaginal cream Place 1 Applicatorful vaginally 2 (two) times a week. At hs 42.5 g 1  . lenalidomide (REVLIMID) 10 MG capsule Take 1 capsule (10 mg total) by mouth daily. Authorization #[id number] 21 capsule 0  . Magnesium Citrate POWD by Does not apply route  daily.    . mirtazapine (REMERON) 15 MG tablet Take 0.5 tablets (7.5 mg total) by mouth at bedtime. 30 tablet 2  . MISC NATURAL PRODUCTS PO Take 15 mg by mouth 2 (two) times daily.     No current facility-administered medications for this visit.      ALLERGIES: Ampicillin and Penicillins  Family History  Problem Relation Age of Onset  . COPD Father        dec age 25/s-smoked  . Macular degeneration Father   . Diabetes Brother   . Other Brother        committed suicide age 17  . Macular degeneration Brother   . Hyperlipidemia Mother   . Diabetes Maternal Grandmother   . Heart attack Paternal Grandfather     Social History   Socioeconomic History  . Marital status: Married    Spouse name: [name]  . Number of children: 2  . Years of education: college  . 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 Financial resource strain: Not on file  . Food insecurity:    Worry: Not on file    Inability: Not on file  . Transportation needs:    Medical: Not on file    Non-medical: Not on file  Tobacco Use  . Smoking status: Never Smoker  . Smokeless tobacco: Never Used  Substance and Sexual Activity  . Alcohol use: No    Alcohol/week: 0.0 oz  . Drug use: No  . Sexual activity: Not Currently    Partners: Male    Birth control/protection: Surgical    Comment: TAH--ovaries remain  Lifestyle  . Physical activity:    Days per week: Not on file    Minutes per session: Not on file  . Stress: Not on file  Relationships  . Social connections:    Talks on phone: Not on file    Gets together: Not on file    Attends religious service: Not on file    Active member of club or organization: Not on file    Attends meetings of clubs or organizations: Not on file    Relationship status: Not on file  . Intimate partner violence:    Fear of current or ex partner: Not on file    Emotionally abused: Not on file    Physically abused: Not on file    Forced sexual activity: Not on file  Other Topics Concern  . Not on file  Social History Narrative   Lives with husband   Caffeine- coffee, 2 cups daily    ROS:  Pertinent items are noted in HPI.  PHYSICAL EXAMINATION:    BP 100/60 (BP Location: Right Arm, Patient Position: Sitting, Cuff Size: Normal)   Pulse 76   Resp 16   Ht 5' 8"  (1.727 m)   Wt 130 lb (59 kg)   LMP 09/26/1995 (Within Months)   BMI 19.77 kg/m     General appearance: alert, cooperative and appears stated age   Pelvic: External genitalia:  no lesions              Urethra:  normal appearing urethra with no masses, tenderness or lesions              Bartholins and Skenes: normal                 Vagina:  Vaginal vault prolapse.  Quarter size of granulation  tissue at posterior  apex.  Biopsy of granulation tissue to pathology.  Area then treated with silver nitrate stick.               Cervix:  Absent.                Bimanual Exam:  Uterus:  Absent.               Adnexa: no mass, fullness, tenderness               Chaperone was present for exam.  ASSESSMENT  Vaginal vault prolapse following hysterectomy.  Vaginal granulation tissue.  Vagina intolerant of pessary.   PLAN  We discussed a colpocleisis versus a robotic sacrocolpopexy. I think she is going to need a midurethral sling with either procedure.  Urodynamic testing ordered. The vaginal would need to be completely healed prior to considering a colpocleisis.    An After Visit Summary was printed and given to the patient.  __15____ minutes face to face time of which over 50% was spent in counseling.

## 2018-01-14 NOTE — Patient Instructions (Signed)
The surgery to close the vagina is called a colpocleisis or LeFort procedure.   You may consider the web site [email or web address].com for more information.

## 2018-01-15 LAB — MULTIPLE MYELOMA PANEL, SERUM
ALBUMIN/GLOB SERPL: 1 (ref 0.7–1.7)
ALPHA 1: 0.2 g/dL (ref 0.0–0.4)
ALPHA2 GLOB SERPL ELPH-MCNC: 0.8 g/dL (ref 0.4–1.0)
Albumin SerPl Elph-Mcnc: 3.5 g/dL (ref 2.9–4.4)
B-Globulin SerPl Elph-Mcnc: 1.2 g/dL (ref 0.7–1.3)
GAMMA GLOB SERPL ELPH-MCNC: 1.4 g/dL (ref 0.4–1.8)
GLOBULIN, TOTAL: 3.6 g/dL (ref 2.2–3.9)
IGG (IMMUNOGLOBIN G), SERUM: 1358 mg/dL (ref 700–1600)
IgA: 437 mg/dL — ABNORMAL HIGH (ref 87–352)
IgM (Immunoglobulin M), Srm: 46 mg/dL (ref 26–217)
Total Protein ELP: 7.1 g/dL (ref 6.0–8.5)

## 2018-01-17 ENCOUNTER — Telehealth: Payer: Self-pay | Admitting: Obstetrics and Gynecology

## 2018-01-17 NOTE — Telephone Encounter (Signed)
Left message to call Kaitlyn at [phone]. 

## 2018-01-17 NOTE — Telephone Encounter (Signed)
Patient is scheduled for urodynamics on 02/06/18 at 2:00pm. Patient is aware of appointment date, time and cancellation policy. Routing to nurse to contact patient for pre instructions for procedure.

## 2018-01-17 NOTE — Telephone Encounter (Signed)
Call placed to convey benefits for urodynamics. Left message on voicemail.

## 2018-01-17 NOTE — Telephone Encounter (Signed)
I would prefer to have her urine checked here with our protocol.

## 2018-01-17 NOTE — Telephone Encounter (Signed)
Spoke with patient. Patient works at [employer] and would like to have urine tested at work. Requesting order to be faxed there to have testing on 12/31/17. Follow up appointment scheduled for 02/11/2018 at 9:30 am with Dr.Silva to review testing. Patient is agreeable to both appointment dates and times. Urodynamics reviewed with patient. All questions answered. Urodynamics information sheet mailed to patient.  Dr.Silva, okay for patient to have urine checked at [employer]?

## 2018-01-18 NOTE — Telephone Encounter (Signed)
Spoke with patient after review with Dr.Silva. Per Dr.Silva okay to do any time that works for patient to reduce time away from work. Appointment scheduled for urine check on 01/30/2018 at 11:45 am. Patient is agreeable.  Routing to provider for final review. Patient agreeable to disposition. Will close encounter.

## 2018-01-23 ENCOUNTER — Encounter: Payer: Self-pay | Admitting: Hematology

## 2018-01-25 ENCOUNTER — Telehealth: Payer: Self-pay

## 2018-01-25 NOTE — Telephone Encounter (Signed)
Pt sent message via MyChart requesting to know if kappa/lambda light chain elevation is of concern. Per [name], light chains are within normal range and there is no M spike. Pt intended to have surgery. In response asked the pt when she was scheduled for surgery and what we would need to be involved in regarding f/u.

## 2018-01-28 ENCOUNTER — Telehealth: Payer: Self-pay | Admitting: *Deleted

## 2018-01-28 NOTE — Telephone Encounter (Signed)
-----   Message from Brunetta Genera, MD sent at 01/28/2018  8:39 AM EDT ----- Delle Reining. COuld let patient know that we would like to hold Revlimid 2 weeks prior to surgery and get labs to watch her blood counts pre-op. thx GK  ----- Message ----- From: Alla Feeling, NP Sent: 01/11/2018  12:46 PM To: Brunetta Genera, MD  Hi [name],   I saw this patient today, lovely lady. She is doing well. Just FYI she wants to undergo GYN surgery with Dr. Quincy Simmonds soon (no date set yet). Her pessary is not working. Not sure if she'll need to hold revlimid or get additional lab prior to surgery at your discretion.   Thanks for letting me see her, Regan Rakers

## 2018-01-28 NOTE — Telephone Encounter (Signed)
Per [name], LVM to inform pt to hold Revlimid 2 weeks prior to upcoming GYN surgery, and to check labs prior.  Call back number provided for questions.

## 2018-01-30 ENCOUNTER — Ambulatory Visit (INDEPENDENT_AMBULATORY_CARE_PROVIDER_SITE_OTHER): Payer: 59

## 2018-01-30 VITALS — BP 100/62 | HR 70 | Ht 68.0 in | Wt 128.6 lb

## 2018-01-30 DIAGNOSIS — R319 Hematuria, unspecified: Secondary | ICD-10-CM

## 2018-01-30 DIAGNOSIS — R32 Unspecified urinary incontinence: Secondary | ICD-10-CM

## 2018-01-30 LAB — POCT URINALYSIS DIPSTICK
BILIRUBIN UA: NEGATIVE
GLUCOSE UA: NEGATIVE
Ketones, UA: NEGATIVE
Leukocytes, UA: NEGATIVE
Nitrite, UA: NEGATIVE
Protein, UA: NEGATIVE
Urobilinogen, UA: 0.2 E.U./dL
pH, UA: 5 (ref 5.0–8.0)

## 2018-01-30 NOTE — Progress Notes (Signed)
Patient here for urinalysis prior to urodynamics testing on 02-06-18.  Urine Dip: 1+ RBCs. Per Dr.Silva will send for U.C.and micro. Patient is asymptomatic and states has a history of negative work-up for hematuria.

## 2018-01-31 ENCOUNTER — Other Ambulatory Visit: Payer: Self-pay | Admitting: Hematology

## 2018-01-31 ENCOUNTER — Other Ambulatory Visit: Payer: Self-pay | Admitting: *Deleted

## 2018-01-31 DIAGNOSIS — C9000 Multiple myeloma not having achieved remission: Secondary | ICD-10-CM

## 2018-01-31 LAB — URINALYSIS, MICROSCOPIC ONLY
Bacteria, UA: NONE SEEN
CASTS: NONE SEEN /LPF

## 2018-01-31 LAB — URINE CULTURE

## 2018-01-31 MED ORDER — LENALIDOMIDE 10 MG PO CAPS: 10.0000 mg | ORAL_CAPSULE | Freq: Every day | ORAL | 0 refills | Status: DC

## 2018-02-06 ENCOUNTER — Ambulatory Visit (INDEPENDENT_AMBULATORY_CARE_PROVIDER_SITE_OTHER): Payer: 59 | Admitting: Obstetrics and Gynecology

## 2018-02-06 DIAGNOSIS — R32 Unspecified urinary incontinence: Secondary | ICD-10-CM

## 2018-02-07 NOTE — Progress Notes (Signed)
Urodynamic procedure reviewed and verbal consent obtained. Procedure performed without difficulty and urinary leaking noted with cough when 214 cc, 312 cc, and 452 cc of normal saline was instilled into the bladder. See Lumax report for full study details. Patient tolerated procedure well and post procedure instructions reviewed. Follow-up appointment scheduled for 02/08/2018 at 8:30 am with Dr Quincy Simmonds.

## 2018-02-07 NOTE — Patient Instructions (Signed)
Follow up with Dr.Silva on 02/08/2018 at 8:30 am.

## 2018-02-08 ENCOUNTER — Encounter: Payer: Self-pay | Admitting: Obstetrics and Gynecology

## 2018-02-08 ENCOUNTER — Other Ambulatory Visit: Payer: Self-pay

## 2018-02-08 ENCOUNTER — Ambulatory Visit (INDEPENDENT_AMBULATORY_CARE_PROVIDER_SITE_OTHER): Payer: 59 | Admitting: Obstetrics and Gynecology

## 2018-02-08 ENCOUNTER — Ambulatory Visit: Payer: 59 | Admitting: Obstetrics and Gynecology

## 2018-02-08 VITALS — BP 112/60 | HR 64 | Resp 16 | Ht 68.0 in | Wt 130.0 lb

## 2018-02-08 DIAGNOSIS — N393 Stress incontinence (female) (male): Secondary | ICD-10-CM

## 2018-02-08 DIAGNOSIS — N993 Prolapse of vaginal vault after hysterectomy: Secondary | ICD-10-CM

## 2018-02-08 NOTE — Progress Notes (Signed)
GYNECOLOGY  VISIT   HPI: 62 y.o.   Married  Caucasian  female   G2P2002 with Patient's last menstrual period was 09/26/1995 (within months).   here to discuss urodynamics. Considering surgery for prolapse and incontinence.  Multichannel urodynamic testing with reduction of prolapse. Uroflow - Void 492 cc.  PVR 10 cc.  CMG - S1 212 cc, S2 274 cc, S3 337 cc, S4 447 cc. Stable CMG.  VLPP 101 cmH20 at 452 cc.  UPP - 23 cm H20. Pressure flow - voided 492 cc. No Pdetmax measured.  Bled again with pessary use.   Husband will have surgery in June.   Patient also need foot surgery.  GYNECOLOGIC HISTORY: Patient's last menstrual period was 09/26/1995 (within months). Contraception:  Hysterectomy Menopausal hormone therapy:  Estradiol Last mammogram:  06/23/16 BIRADS 1 negative/density b Last pap smear:   03/22/17 ASCUS w/ Negative HR HPV        OB History    Gravida  2   Para  2   Term  2   Preterm      AB      Living  2     SAB      TAB      Ectopic      Multiple      Live Births                 Patient Active Problem List   Diagnosis Date Noted  . Vaginal granulation tissue 12/03/2017  . Urinary tract infection without hematuria   . Muscular fasciculation   . Other fatigue   . Abdominal pain   . FTT (failure to thrive) in adult 08/09/2017  . Counseling regarding advanced care planning and goals of care 08/03/2017  . Gastroesophageal reflux disease without esophagitis 01/17/2017  . S/P autologous bone marrow transplantation ([place]) 03/29/2016  . Near syncope 01/04/2016  . Peripheral edema 12/19/2015  . Hematuria 11/08/2015  . Rash 11/01/2015  . Multiple myeloma ([place]) 10/19/2015  . Metastatic multiple myeloma to bone ([place]) 10/19/2015  . Plasma cell dyscrasia 10/05/2015  . Anemia 09/29/2015  . Hypergammaglobulinemia 09/29/2015  . HYPOXEMIA 12/16/2007  . HYPERLIPIDEMIA 12/04/2007  . DYSPNEA 12/04/2007  . MALIGNANT NEOPLASM OF THYMUS 12/03/2007  .  OBSTRUCTIVE SLEEP APNEA 12/03/2007  . Restless leg syndrome 12/03/2007    Past Medical History:  Diagnosis Date  . Abnormal Pap smear of vagina 03/22/2017   ASCUS pap and negative HR HPV.  Patient is on immunosuppressive medication.   . Cancer ([place]) 1999   squamous cell carcinoma of thymus  . Fibroid    reason for Hysterectomy  . Migraine   . Multiple myeloma ([place]) 08/2015  . Multiple myeloma ([place])   . Pain aggravated by activities of daily living    states she pulled something at right sternum in dec 2016  . PONV (postoperative nausea and vomiting)    thinks morphine caused nausea  . Prediabetes   . Urinary incontinence     Past Surgical History:  Procedure Laterality Date  . ABDOMINAL HYSTERECTOMY  1997   TAH--ovaries remain--Dr. Newton Pigg  . THYMECTOMY  1999    Current Outpatient Medications  Medication Sig Dispense Refill  . aspirin EC 81 MG tablet Take 1 tablet (81 mg total) by mouth daily.    . Calcium Carb-Cholecalciferol (CALCIUM+D3) 600-800 MG-UNIT TABS Take 2 (two) times daily by mouth.    . estradiol (ESTRACE) 0.1 MG/GM vaginal cream Place 1 Applicatorful vaginally 2 (two) times a  week. At hs 42.5 g 1  . lenalidomide (REVLIMID) 10 MG capsule Take 1 capsule (10 mg total) by mouth daily. Authorization #[id number] 21 capsule 0  . Magnesium Citrate POWD by Does not apply route daily.    . mirtazapine (REMERON) 15 MG tablet Take 0.5 tablets (7.5 mg total) by mouth at bedtime. 30 tablet 2  . MISC NATURAL PRODUCTS PO Take 15 mg by mouth 2 (two) times daily.     No current facility-administered medications for this visit.      ALLERGIES: Ampicillin and Penicillins  Family History  Problem Relation Age of Onset  . COPD Father        dec age 85/s-smoked  . Macular degeneration Father   . Diabetes Brother   . Other Brother        committed suicide age 51  . Macular degeneration Brother   . Hyperlipidemia Mother   . Diabetes Maternal Grandmother   . Heart  attack Paternal Grandfather     Social History   Socioeconomic History  . Marital status: Married    Spouse name: Charles  . Number of children: 2  . Years of education: college  . 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 Financial resource strain: Not on file  . Food insecurity:    Worry: Not on file    Inability: Not on file  . Transportation needs:    Medical: Not on file    Non-medical: Not on file  Tobacco Use  . Smoking status: Never Smoker  . Smokeless tobacco: Never Used  Substance and Sexual Activity  . Alcohol use: No    Alcohol/week: 0.0 oz  . Drug use: No  . Sexual activity: Not Currently    Partners: Male    Birth control/protection: Surgical    Comment: TAH--ovaries remain  Lifestyle  . Physical activity:    Days per week: Not on file    Minutes per session: Not on file  . Stress: Not on file  Relationships  . Social connections:    Talks on phone: Not on file    Gets together: Not on file    Attends religious service: Not on file    Active member of club or organization: Not on file    Attends meetings of clubs or organizations: Not on file    Relationship status: Not on file  . Intimate partner violence:    Fear of current or ex partner: Not on file    Emotionally abused: Not on file    Physically abused: Not on file    Forced sexual activity: Not on file  Other Topics Concern  . Not on file  Social History Narrative   Lives with husband   Caffeine- coffee, 2 cups daily    Review of Systems  Constitutional: Negative.   HENT: Negative.   Eyes: Negative.   Respiratory: Negative.   Cardiovascular: Negative.   Gastrointestinal: Negative.   Endocrine: Negative.   Genitourinary: Negative.   Musculoskeletal: Negative.   Skin: Negative.   Allergic/Immunologic: Negative.   Neurological: Negative.   Hematological: Negative.   Psychiatric/Behavioral: Negative.      PHYSICAL EXAMINATION:    BP 112/60 (BP Location: Right Arm, Patient Position: Sitting, Cuff Size: Normal)   Pulse 64   Resp 16   Ht 5' 8" (1.727 m)   Wt 130 lb (59 kg)   LMP 09/26/1995 (Within Months)     BMI 19.77 kg/m     General appearance: alert, cooperative and appears stated age.   ASSESSMENT  Stress incontinence.  Vaginal vault eversion post hysterectomy.  Chronic vaginal mucosal ulceration with pessary use.  On Revlimid.   PLAN  Discussion regarding urinary stress incontinence and options for surgical treatment being midurethral sling and pubovaginal sling. We reviewed permanent mesh materials which are currently the most widely used for this procedure.  I discussed midurethral slings being 85 - 90% effective in treatment of stress incontinence.  I discussed risks of slings including but not limited to erosions and exposure of mesh, dyspareunia, cystotomy, urinary retention and slower voiding, increase in urgency symptoms, urinary tract infections, need for reoperation.  We also reviewed surgical options for prolapse - colpocleisis, vaginal reconstruction sacrospinous fixation with or without a biologic graft, and laparoscopic sacrocolpopexy.  I explained the differences in these procedures.  Patient understands the permanent mesh will be used for her midurethral sling and sacrocolpopexy if I am doing the surgery.  If she wishes for a pubovaginal sling and vaginal prolapse repair with sacrospinous fixation +/- biologic graft placement, I would refer her to Dr. Matilde Sprang or provider of choice.   Patient is most interested in a laparoscopic sacrocolpopexy with anterior and posterior colporrhaphy, TVT Exact midurethral sling, cystoscopy.  Will move in this direction.    An After Visit Summary was printed and given to the patient.  _40_____ minutes face to face time of which over 50% was spent in counseling.

## 2018-02-11 ENCOUNTER — Ambulatory Visit: Payer: 59 | Admitting: Obstetrics and Gynecology

## 2018-02-12 ENCOUNTER — Telehealth: Payer: Self-pay | Admitting: Obstetrics and Gynecology

## 2018-02-12 NOTE — Telephone Encounter (Signed)
Spoke with patient regarding benefit for surgery. Patient understood and agreeable. Patient has confirmed and is ready to proceed with scheduling. Patient aware this is professional benefit only. Patient aware will be contacted by hospital for separate benefits. Forwarding to Nurse Supervisor for scheduling ° °Routing to Sally Yeakley, RN °

## 2018-02-20 NOTE — Telephone Encounter (Signed)
Call to patient. Review of surgery date options.  Patient states she is not available until after 03-28-18 due to work.  Appointment scheduled for 03-25-18 for recheck of vaginal ulceration following pessary use.  Can determine final surgery plan with Dr Quincy Simmonds at that time. Will tentatively plan for robotic sacrocolpopexy on 05-14-18 pending appointment with Dr Quincy Simmonds on 03-25-18.  Patient aware Dr Quincy Simmonds will review call and we will call back if any additions or changes.    Routing to provider for final review. Patient agreeable to disposition. Will close encounter.

## 2018-02-20 NOTE — Telephone Encounter (Signed)
Call to patient. Confirmed case is tentatively scheduled for 05-14-18 at [facility] at [place] pending Dr Quincy Simmonds appointment and anesthesia review. Also advised, per Dr Quincy Simmonds, will need clearance from oncologist to proceed. Patient is scheduled to see Dr Irene Limbo on 03-08-18.

## 2018-02-22 ENCOUNTER — Other Ambulatory Visit: Payer: Self-pay | Admitting: Hematology

## 2018-02-22 DIAGNOSIS — C9000 Multiple myeloma not having achieved remission: Secondary | ICD-10-CM

## 2018-02-27 ENCOUNTER — Other Ambulatory Visit: Payer: Self-pay

## 2018-02-27 DIAGNOSIS — C9000 Multiple myeloma not having achieved remission: Secondary | ICD-10-CM

## 2018-02-27 MED ORDER — LENALIDOMIDE 10 MG PO CAPS: 10.0000 mg | ORAL_CAPSULE | Freq: Every day | ORAL | 0 refills | Status: DC

## 2018-03-04 ENCOUNTER — Telehealth: Payer: Self-pay | Admitting: *Deleted

## 2018-03-04 NOTE — Telephone Encounter (Signed)
Call to patient. Advised Dr Quincy Simmonds has reviewed case with anesthesia. Due to diagnosis of sleep apnea and uses only oral appliance from orthodontist (per patient), will need formal evaluation and sleep study prior to elective surgery.  Advised this will need to be ordered by PCP.  Patient states she will call PCP for this.   Routing to Dr Quincy Simmonds. Encounter closed.

## 2018-03-06 ENCOUNTER — Encounter: Payer: Self-pay | Admitting: Internal Medicine

## 2018-03-06 ENCOUNTER — Ambulatory Visit (INDEPENDENT_AMBULATORY_CARE_PROVIDER_SITE_OTHER): Payer: 59 | Admitting: Internal Medicine

## 2018-03-06 VITALS — BP 118/60 | HR 79 | Resp 16 | Ht 68.0 in | Wt 132.0 lb

## 2018-03-06 DIAGNOSIS — N8110 Cystocele, unspecified: Secondary | ICD-10-CM

## 2018-03-06 DIAGNOSIS — G4733 Obstructive sleep apnea (adult) (pediatric): Secondary | ICD-10-CM | POA: Diagnosis not present

## 2018-03-06 HISTORY — DX: Cystocele, unspecified: N81.10

## 2018-03-06 NOTE — Patient Instructions (Signed)
Will perform sleep test with oral device in place. If positive will refer you back to your dentist.

## 2018-03-06 NOTE — Progress Notes (Signed)
[facility] Consultation      Assessment and Plan:  History of obstructive sleep apnea with snoring, daytime sleepiness. - Patient is currently on a dental device with increasing symptoms though mild.  She is being evaluated for a rectocele/cystocele surgery. - Will send for sleep study, if positive will need referral back to her dentist to adjust her dental device.  If negative no further action is necessary.  Orders Placed This Encounter  Procedures  . Home sleep test     Date: 03/06/2018  MRN# [id number] Paula Johnson 64-15-1957   Paula Johnson is a 62 y.o. old female seen in consultation for chief complaint of:    Chief Complaint  Patient presents with  . Consult    Self referral former [facility] patient here for eval of excessive daytime sleepiness.  . Sleep Apnea    pt has dental    HPI:   She was diagnosed with OSA about 10 years ago. She is getting ready to undergo surgery for rectocele and cystocele. She did not like using the cpap after using it for about 7 months, she was seeing Dr. Gwenette Greet at that time. She was then referred for a dental device, she has been using it since that time. She uses it every night.  She stopped smoring at the time, but she is snoring again unless she is sleeping on her left side. She does not sleep on her back. She is now on her third appliance, this is about 64-14 years old.   She denies jaw pain, no clicking or slipping of jaw. No sleep walking. She has some daytime sleepiness which has progressed in the past few years.  She has been diagnosed with multiple myeloma and is on chemo, she is s/p BM transplant in June of 2017.    PMHX:   Past Medical History:  Diagnosis Date  . Abnormal Pap smear of vagina 03/22/2017   ASCUS pap and negative HR HPV.  Patient is on immunosuppressive medication.   . Cancer ([place]) 1999   squamous cell carcinoma of thymus  . Fibroid    reason for Hysterectomy  . Migraine   . Multiple myeloma  ([place]) 08/2015  . Multiple myeloma ([place])   . Pain aggravated by activities of daily living    states she pulled something at right sternum in dec 2016  . PONV (postoperative nausea and vomiting)    thinks morphine caused nausea  . Prediabetes   . Urinary incontinence    Surgical Hx:  Past Surgical History:  Procedure Laterality Date  . ABDOMINAL HYSTERECTOMY  1997   TAH--ovaries remain--Dr. Newton Pigg  . THYMECTOMY  1999   Family Hx:  Family History  Problem Relation Age of Onset  . COPD Father        dec age 70/s-smoked  . Macular degeneration Father   . Diabetes Brother   . Other Brother        committed suicide age 34  . Macular degeneration Brother   . Hyperlipidemia Mother   . Diabetes Maternal Grandmother   . Heart attack Paternal Grandfather    Social Hx:   Social History   Tobacco Use  . Smoking status: Never Smoker  . Smokeless tobacco: Never Used  Substance Use Topics  . Alcohol use: No    Alcohol/week: 0.0 oz  . Drug use: No   Medication:    Current Outpatient Medications:  .  aspirin EC 81 MG tablet, Take 1 tablet (81  mg total) by mouth daily., Disp: , Rfl:  .  Calcium Carb-Cholecalciferol (CALCIUM+D3) 600-800 MG-UNIT TABS, Take 2 (two) times daily by mouth., Disp: , Rfl:  .  estradiol (ESTRACE) 0.1 MG/GM vaginal cream, Place 1 Applicatorful vaginally 2 (two) times a week. At hs, Disp: 42.5 g, Rfl: 1 .  lenalidomide (REVLIMID) 10 MG capsule, Take 1 capsule (10 mg total) by mouth daily. Authorization #[id number] 02/27/18, Disp: 21 capsule, Rfl: 0 .  Magnesium Citrate POWD, by Does not apply route daily., Disp: , Rfl:  .  mirtazapine (REMERON) 15 MG tablet, Take 0.5 tablets (7.5 mg total) by mouth at bedtime., Disp: 30 tablet, Rfl: 2 .  MISC NATURAL PRODUCTS PO, Take 15 mg by mouth 2 (two) times daily., Disp: , Rfl:    Allergies:  Ampicillin and Penicillins  Review of Systems: Gen:  Denies  fever, sweats, chills HEENT: Denies blurred vision, double  vision. bleeds, sore throat Cvc:  No dizziness, chest pain. Resp:   Denies cough or sputum production, shortness of breath Gi: Denies swallowing difficulty, stomach pain. Gu:  Denies bladder incontinence, burning urine Ext:   No Joint pain, stiffness. Skin: No skin rash,  hives  Endoc:  No polyuria, polydipsia. Psych: No depression, insomnia. Other:  All other systems were reviewed with the patient and were negative other that what is mentioned in the HPI.   Physical Examination:   VS: BP 118/60 (BP Location: Left Arm, Cuff Size: Normal)   Pulse 79   Resp 16   Ht 5' 8"  (1.727 m)   Wt 132 lb (59.9 kg)   LMP 09/26/1995 (Within Months)   SpO2 99%   BMI 20.07 kg/m   General Appearance: No distress  Neuro:without focal findings,  speech normal,  HEENT: PERRLA, EOM intact.   Pulmonary: normal breath sounds, No wheezing.  CardiovascularNormal S1,S2.  No m/r/g.   Abdomen: Benign, Soft, non-tender. Renal:  No costovertebral tenderness  GU:  No performed at this time. Endoc: No evident thyromegaly, no signs of acromegaly. Skin:   warm, no rashes, no ecchymosis  Extremities: normal, no cyanosis, clubbing.  Other findings:    LABORATORY PANEL:   CBC No results for input(s): WBC, HGB, HCT, PLT in the last 168 hours. ------------------------------------------------------------------------------------------------------------------  Chemistries  No results for input(s): NA, K, CL, CO2, GLUCOSE, BUN, CREATININE, CALCIUM, MG, AST, ALT, ALKPHOS, BILITOT in the last 168 hours.  Invalid input(s): GFRCGP ------------------------------------------------------------------------------------------------------------------  Cardiac Enzymes No results for input(s): TROPONINI in the last 168 hours. ------------------------------------------------------------  RADIOLOGY:  No results found.     Thank  you for the consultation and for allowing [facility] to assist  in the care of your patient. Our recommendations are noted above.  Please contact us if we can be of further service.   Marda Stalker, MD.  Board Certified in Internal Medicine, Pulmonary Medicine, [place], and Sleep Medicine.  [facility] Office Number: [phone]  Patricia Pesa, M.D.  Merton Border, M.D  03/06/2018

## 2018-03-07 NOTE — Progress Notes (Signed)
HEMATOLOGY/ONCOLOGY CLINIC NOTE  Date of Service: 03/08/18  PCP: .[name], MD  CC:  F/u for continue mx of multiple myeloma  CHIEF COMPLAINTS/PURPOSE OF CONSULTATION:  Follow up for multiple myeloma  DIAGNOSIS  IgA lambda R-ISS stage II multiple myeloma with innumerable lytic lesions in the calvarium, lesion in T7 vertebra and multiple lesions in the pelvis.  No significant bone pain at this time. Diagnosed in January 2017.  Current treatment  Restarting on Revlimid 33m po daily 3 weeks on 1 week off (after treatment interruption)   Previous Treatment  Status post Vd x 1 cycle VRd x 6 cycles  HD Melphalan 204mm2 on 03/22/2016 Autologous HSCT on 03/23/2016 with Dr [facility]Samule [facility] 6.4 x10^6/kg)  -Revlimid maintenance 1549mo daily   INTERVAL HISTORY  Paula Johnson here for fu of her multiple myeloma. The patient's last visit with us [place] on 11/16/17. The pt reports that she is doing well overall.   The pt reports that she is awaiting surgery on 05/14/18 for her uterine prolapse. She is also awaiting a home sleep study and has used a CPAP in the past but currently uses an oral device.  She has no other concerns in the interim besides a recent viral infection.    Lab results today (03/08/18) of CBC, CMP, and Reticulocytes is as follows: all values are WNL except for WBC at 3.2k, RDW at 14.6. MMP 03/08/18 is no M spike Kappa/Lamda 03/08/18 showed normal SFLC ratio  On review of systems, pt reports good enerf and denies back pains, new bone pains, abdominal pains, leg swelling and any other symptoms.    REVIEW OF SYSTEMS:    A 10+ POINT REVIEW OF SYSTEMS WAS OBTAINED including neurology, dermatology, psychiatry, cardiac, respiratory, lymph, extremities, GI, GU, Musculoskeletal, constitutional, breasts, reproductive, HEENT.  All pertinent positives are noted in the HPI.  All others are negative.    PHYSICAL EXAMINATION: NAD VS reviewed in  EPIC GENERAL:alert, in no acute distress and comfortable SKIN: no acute rashes, no significant lesions EYES: conjunctiva are pink and non-injected, sclera anicteric OROPHARYNX: MMM, no exudates, no oropharyngeal erythema or ulceration NECK: supple, no JVD LYMPH:  no palpable lymphadenopathy in the cervical, axillary or inguinal regions LUNGS: clear to auscultation b/l with normal respiratory effort HEART: regular rate & rhythm ABDOMEN:  normoactive bowel sounds , non tender, not distended. Extremity: no pedal edema PSYCH: alert & oriented x 3 with fluent speech NEURO: no focal motor/sensory deficits   MEDICAL HISTORY:   Past Medical History:  Diagnosis Date  . Abnormal Pap smear of vagina 03/22/2017   ASCUS pap and negative HR HPV.  Patient is on immunosuppressive medication.   . Cancer ([place]   squamous cell carcinoma of thymus  . Fibroid    reason for Hysterectomy  . Migraine   . Multiple myeloma ([place]2016  . [place] . Pain aggravated by activities of daily living    states she pulled something at right sternum in dec 2016  . PONV (postoperative nausea and vomiting)    thinks morphine caused nausea  . Prediabetes   . Urinary incontinence     SURGICAL HISTORY: Past Surgical History:  Procedure Laterality Date  . ABDOMINAL HYSTERECTOMY  1997   TAH--ovaries remain--Dr. ThoNewton Pigg THYMECTOMY  1999    SOCIAL HISTORY: Social History   Socioeconomic History  . Marital status: Married    Spouse name: ChaJuanda Crumble Number of children:  2  . Years of education: college  . 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 Financial resource strain: Not on file  . Food insecurity:    Worry: Not on file    Inability: Not on file  . Transportation needs:    Medical: Not on file    Non-medical: Not on file  Tobacco Use  . Smoking status: Never Smoker  . Smokeless tobacco:  Never Used  Substance and Sexual Activity  . Alcohol use: No    Alcohol/week: 0.0 oz  . Drug use: No  . Sexual activity: Not Currently    Partners: Male    Birth control/protection: Surgical    Comment: TAH--ovaries remain  Lifestyle  . Physical activity:    Days per week: Not on file    Minutes per session: Not on file  . Stress: Not on file  Relationships  . Social connections:    Talks on phone: Not on file    Gets together: Not on file    Attends religious service: Not on file    Active member of club or organization: Not on file    Attends meetings of clubs or organizations: Not on file    Relationship status: Not on file  . Intimate partner violence:    Fear of current or ex partner: Not on file    Emotionally abused: Not on file    Physically abused: Not on file    Forced sexual activity: Not on file  Other Topics Concern  . Not on file  Social History Narrative   Lives with husband   Caffeine- coffee, 2 cups daily    FAMILY HISTORY: Family History  Problem Relation Age of Onset  . COPD Father        dec age 46/s-smoked  . Macular degeneration Father   . Diabetes Brother   . Other Brother        committed suicide age 49  . Macular degeneration Brother   . Hyperlipidemia Mother   . Diabetes Maternal Grandmother   . Heart attack Paternal Grandfather     ALLERGIES:  is allergic to ampicillin and penicillins.  MEDICATIONS:  . Current Outpatient Medications:  .  aspirin EC 81 MG tablet, Take 1 tablet (81 mg total) by mouth daily., Disp: , Rfl:  .  Calcium Carb-Cholecalciferol (CALCIUM+D3) 600-800 MG-UNIT TABS, Take 2 (two) times daily by mouth., Disp: , Rfl:  .  estradiol (ESTRACE) 0.1 MG/GM vaginal cream, Place 1 Applicatorful vaginally 2 (two) times a week. At hs, Disp: 42.5 g, Rfl: 1 .  lenalidomide (REVLIMID) 10 MG capsule, Take 1 capsule (10 mg total) by mouth daily. Authorization #[id number] 02/27/18, Disp: 21 capsule, Rfl: 0 .  Magnesium Citrate POWD, by  Does not apply route daily., Disp: , Rfl:  .  mirtazapine (REMERON) 15 MG tablet, Take 0.5 tablets (7.5 mg total) by mouth at bedtime., Disp: 30 tablet, Rfl: 2 .  MISC NATURAL PRODUCTS PO, Take 15 mg by mouth 2 (two) times daily., Disp: , Rfl:    LABORATORY DATA:  I have reviewed the data as listed  . CBC Latest Ref Rng & Units 03/08/2018 01/11/2018 11/16/2017  WBC 3.9 - 10.3 K/uL 3.2(L) 4.6 5.3  Hemoglobin 11.6 - 15.9 g/dL 12.9 12.6 12.9  Hematocrit 34.8 - 46.6 % 38.7 38.8 39.1  Platelets 145 - 400 K/uL 268 302 325    . CMP Latest  Ref Rng & Units 03/08/2018 01/11/2018 11/16/2017  Glucose 70 - 140 mg/dL 95 144(H) 85  BUN 7 - 26 mg/dL 16 19 14   Creatinine 0.60 - 1.10 mg/dL 0.74 0.78 0.79  Sodium 136 - 145 mmol/L 138 140 142  Potassium 3.5 - 5.1 mmol/L 3.9 4.1 4.3  Chloride 98 - 109 mmol/L 105 109 106  CO2 22 - 29 mmol/L 25 24 27   Calcium 8.4 - 10.4 mg/dL 9.5 9.3 10.2  Total Protein 6.4 - 8.3 g/dL 7.8 7.8 7.9  Total Bilirubin 0.2 - 1.2 mg/dL 0.7 0.4 0.7  Alkaline Phos 40 - 150 U/L 79 84 79  AST 5 - 34 U/L 17 16 14   ALT 0 - 55 U/L 23 34 14     RADIOGRAPHIC STUDIES: I have personally reviewed the radiological images as listed and agreed with the findings in the report. No results found.  ASSESSMENT & PLAN:   62 y.o. caucasian female with  1. H/o Multiple myeloma - currently in remission.  IgA lambda R-ISS stage II multiple myeloma with innumerable lytic lesions in the calvarium, lesion in T7 vertebra and multiple lesions in the pelvis.  No significant bone pain at this time. Diagnosed in January 2017. s/p treatment as noted above 2.Pre-op assessment for myeloma PLAN  -Discussed pt labwork today, 03/08/18; WBC lower at 3.2k, platelets normal, no neutropenia, no anemia.  -Will continue maintenance Revlimid 18m 3 weeks on 1 week off for maintenance - MMP and SFLC shows patient still in remission from her myeloma -Will hold Revlimid for up to one month before and after uterine  surgery on 05/14/18, to minimize risks of clots -continue ASA hold 850maspiring one week before surgery  -attention by surgery to peri-operative VTE prophylaxis with lovenox and early ambulation  RTC with Dr KaIrene Limbon 2 months with labs Continue zometa q8 weeks    The toal time spent in the appt was 25 minutes and more than 50% was on counseling and direct patient cares.   GaSullivan LoneD MSSoldiers GroveAHIVMS SCUniversity Pointe Surgical HospitalTGalileo Surgery Center LPematology/Oncology Physician CoHealthsouth Rehabilitation Hospital Of [facility](Office):       [phone] cell):  [phone]):           [phone], ScBaldwin Jamaicaam acting as a scribe for Dr KaIrene Limbo  .I have reviewed the above documentation for accuracy and completeness, and I agree with the above. .GBrunetta GeneraD

## 2018-03-08 ENCOUNTER — Inpatient Hospital Stay: Payer: 59

## 2018-03-08 ENCOUNTER — Telehealth: Payer: Self-pay

## 2018-03-08 ENCOUNTER — Inpatient Hospital Stay: Payer: 59 | Attending: Hematology | Admitting: Hematology

## 2018-03-08 DIAGNOSIS — C9001 Multiple myeloma in remission: Secondary | ICD-10-CM

## 2018-03-08 DIAGNOSIS — Z7189 Other specified counseling: Secondary | ICD-10-CM

## 2018-03-08 DIAGNOSIS — C9000 Multiple myeloma not having achieved remission: Secondary | ICD-10-CM

## 2018-03-08 DIAGNOSIS — Z01818 Encounter for other preprocedural examination: Secondary | ICD-10-CM

## 2018-03-08 LAB — CBC WITH DIFFERENTIAL (CANCER CENTER ONLY)
BASOS PCT: 1 %
Basophils Absolute: 0 10*3/uL (ref 0.0–0.1)
EOS PCT: 6 %
Eosinophils Absolute: 0.2 10*3/uL (ref 0.0–0.5)
HEMATOCRIT: 38.7 % (ref 34.8–46.6)
Hemoglobin: 12.9 g/dL (ref 11.6–15.9)
Lymphocytes Relative: 32 %
Lymphs Abs: 1 10*3/uL (ref 0.9–3.3)
MCH: 30.6 pg (ref 25.1–34.0)
MCHC: 33.3 g/dL (ref 31.5–36.0)
MCV: 91.9 fL (ref 79.5–101.0)
MONO ABS: 0.4 10*3/uL (ref 0.1–0.9)
MONOS PCT: 12 %
NEUTROS ABS: 1.6 10*3/uL (ref 1.5–6.5)
Neutrophils Relative %: 49 %
Platelet Count: 268 10*3/uL (ref 145–400)
RBC: 4.21 MIL/uL (ref 3.70–5.45)
RDW: 14.6 % — AB (ref 11.2–14.5)
WBC Count: 3.2 10*3/uL — ABNORMAL LOW (ref 3.9–10.3)

## 2018-03-08 LAB — COMPREHENSIVE METABOLIC PANEL
ALBUMIN: 4 g/dL (ref 3.5–5.0)
ALK PHOS: 79 U/L (ref 40–150)
ALT: 23 U/L (ref 0–55)
ANION GAP: 8 (ref 3–11)
AST: 17 U/L (ref 5–34)
BUN: 16 mg/dL (ref 7–26)
CALCIUM: 9.5 mg/dL (ref 8.4–10.4)
CO2: 25 mmol/L (ref 22–29)
Chloride: 105 mmol/L (ref 98–109)
Creatinine, Ser: 0.74 mg/dL (ref 0.60–1.10)
GFR calc Af Amer: >60 mL/min (ref >60)
GFR calc non Af Amer: >60 mL/min (ref >60)
GLUCOSE: 95 mg/dL (ref 70–140)
Potassium: 3.9 mmol/L (ref 3.5–5.1)
SODIUM: 138 mmol/L (ref 136–145)
Total Bilirubin: 0.7 mg/dL (ref 0.2–1.2)
Total Protein: 7.8 g/dL (ref 6.4–8.3)

## 2018-03-08 LAB — RETICULOCYTES
RBC.: 4.21 MIL/uL (ref 3.70–5.45)
RETIC CT PCT: 1.2 % (ref 0.7–2.1)
Retic Count, Absolute: 50.5 10*3/uL (ref 33.7–90.7)

## 2018-03-08 MED ORDER — SODIUM CHLORIDE 0.9 % IV SOLN
INTRAVENOUS | Status: DC
  Administered 2018-03-08: 11:00:00 via INTRAVENOUS

## 2018-03-08 MED ORDER — ZOLEDRONIC ACID 4 MG/100ML IV SOLN
4.0000 mg | Freq: Once | INTRAVENOUS | Status: AC
  Administered 2018-03-08: 4 mg via INTRAVENOUS
  Filled 2018-03-08: qty 100

## 2018-03-08 NOTE — Telephone Encounter (Signed)
Per 6/14 los already scheduled for return visit in 2 months 8/9

## 2018-03-08 NOTE — Patient Instructions (Signed)

## 2018-03-11 LAB — KAPPA/LAMBDA LIGHT CHAINS
KAPPA FREE LGHT CHN: 43 mg/L — AB (ref 3.3–19.4)
KAPPA, LAMDA LIGHT CHAIN RATIO: 0.96 (ref 0.26–1.65)
LAMDA FREE LIGHT CHAINS: 44.6 mg/L — AB (ref 5.7–26.3)

## 2018-03-12 LAB — MULTIPLE MYELOMA PANEL, SERUM
ALBUMIN SERPL ELPH-MCNC: 4 g/dL (ref 2.9–4.4)
ALBUMIN/GLOB SERPL: 1.3 (ref 0.7–1.7)
Alpha 1: 0.2 g/dL (ref 0.0–0.4)
Alpha2 Glob SerPl Elph-Mcnc: 0.7 g/dL (ref 0.4–1.0)
B-Globulin SerPl Elph-Mcnc: 1.1 g/dL (ref 0.7–1.3)
GLOBULIN, TOTAL: 3.3 g/dL (ref 2.2–3.9)
Gamma Glob SerPl Elph-Mcnc: 1.3 g/dL (ref 0.4–1.8)
IGA: 465 mg/dL — AB (ref 87–352)
IgG (Immunoglobin G), Serum: 1391 mg/dL (ref 700–1600)
IgM (Immunoglobulin M), Srm: 49 mg/dL (ref 26–217)
Total Protein ELP: 7.3 g/dL (ref 6.0–8.5)

## 2018-03-16 ENCOUNTER — Encounter: Payer: Self-pay | Admitting: Hematology

## 2018-03-18 ENCOUNTER — Encounter: Payer: Self-pay | Admitting: Obstetrics and Gynecology

## 2018-03-25 ENCOUNTER — Encounter: Payer: Self-pay | Admitting: Obstetrics and Gynecology

## 2018-03-25 ENCOUNTER — Ambulatory Visit (INDEPENDENT_AMBULATORY_CARE_PROVIDER_SITE_OTHER): Payer: 59 | Admitting: Obstetrics and Gynecology

## 2018-03-25 ENCOUNTER — Other Ambulatory Visit: Payer: Self-pay

## 2018-03-25 VITALS — BP 102/68 | HR 76 | Resp 14 | Ht 68.0 in | Wt 133.4 lb

## 2018-03-25 DIAGNOSIS — Z0289 Encounter for other administrative examinations: Secondary | ICD-10-CM

## 2018-03-25 DIAGNOSIS — N993 Prolapse of vaginal vault after hysterectomy: Secondary | ICD-10-CM

## 2018-03-25 DIAGNOSIS — N393 Stress incontinence (female) (male): Secondary | ICD-10-CM | POA: Diagnosis not present

## 2018-03-25 MED ORDER — ESTRADIOL 0.1 MG/GM VA CREA: 1.0000 | TOPICAL_CREAM | VAGINAL | 1 refills | Status: DC

## 2018-03-25 NOTE — Progress Notes (Signed)
GYNECOLOGY  VISIT   HPI: 62 y.o.   Married  Caucasian  female   G2P2002 with Patient's last menstrual period was 09/26/1995 (within months).   here for   Recheck of vaginal ulceration following pessary use.  Pessary is now out now since 02/08/18.  Not using any vaginal cream.   No vaginal bleeding.  No pain.   Using Femicushion most days to hold the prolapse.  Takes it out at night.  This helps the prolapse some.  It doe not hurt or aggravate.  This is helping to control the frequency of voiding.   Sleep study being done at home tomorrow night.   Will stop Revlimid for 3 weeks prior to surgery to reduce immunosupression. Taking ASA 81 mg to reduce clotting while on Revlimid.    Husband having surgery 04/22/18.  GYNECOLOGIC HISTORY: Patient's last menstrual period was 09/26/1995 (within months). Contraception:  hysterectomy Menopausal hormone therapy:  Estradiol  Last mammogram:  01-11-18 density b/BIRADS 1 negative  Last pap smear:   03-22-17 ASCUS, HR HPV negative         OB History    Gravida  2   Para  2   Term  2   Preterm      AB      Living  2     SAB      TAB      Ectopic      Multiple      Live Births                 Patient Active Problem List   Diagnosis Date Noted  . Cystocele, unspecified (CODE) 03/06/2018  . Vaginal granulation tissue 12/03/2017  . Urinary tract infection without hematuria   . Muscular fasciculation   . Other fatigue   . Abdominal pain   . FTT (failure to thrive) in adult 08/09/2017  . Counseling regarding advanced care planning and goals of care 08/03/2017  . Gastroesophageal reflux disease without esophagitis 01/17/2017  . S/P autologous bone marrow transplantation ([place]) 03/29/2016  . Near syncope 01/04/2016  . Peripheral edema 12/19/2015  . Hematuria 11/08/2015  . Rash 11/01/2015  . Multiple myeloma ([place]) 10/19/2015  . Metastatic multiple myeloma to bone ([place]) 10/19/2015  . Plasma cell dyscrasia 10/05/2015   . Anemia 09/29/2015  . Hypergammaglobulinemia 09/29/2015  . HYPOXEMIA 12/16/2007  . HYPERLIPIDEMIA 12/04/2007  . DYSPNEA 12/04/2007  . MALIGNANT NEOPLASM OF THYMUS 12/03/2007  . OBSTRUCTIVE SLEEP APNEA 12/03/2007  . Restless leg syndrome 12/03/2007    Past Medical History:  Diagnosis Date  . Abnormal Pap smear of vagina 03/22/2017   ASCUS pap and negative HR HPV.  Patient is on immunosuppressive medication.   . Cancer ([place]) 1999   squamous cell carcinoma of thymus  . Fibroid    reason for Hysterectomy  . Migraine   . Multiple myeloma ([place]) 08/2015  . Multiple myeloma ([place])   . Pain aggravated by activities of daily living    states she pulled something at right sternum in dec 2016  . PONV (postoperative nausea and vomiting)    thinks morphine caused nausea  . Prediabetes   . Urinary incontinence     Past Surgical History:  Procedure Laterality Date  . ABDOMINAL HYSTERECTOMY  1997   TAH--ovaries remain--Dr. Newton Pigg  . THYMECTOMY  1999    Current Outpatient Medications  Medication Sig Dispense Refill  . aspirin EC 81 MG tablet Take 1 tablet (81 mg total) by mouth daily.    Marland Kitchen  Calcium Carb-Cholecalciferol (CALCIUM+D3) 600-800 MG-UNIT TABS Take 2 (two) times daily by mouth.    . estradiol (ESTRACE) 0.1 MG/GM vaginal cream Place 1 Applicatorful vaginally 2 (two) times a week. At hs 42.5 g 1  . lenalidomide (REVLIMID) 10 MG capsule Take 1 capsule (10 mg total) by mouth daily. Authorization #[id number] 02/27/18 21 capsule 0  . Magnesium Citrate POWD by Does not apply route daily.    Marland Kitchen MISC NATURAL PRODUCTS PO Take 15 mg by mouth 2 (two) times daily.     No current facility-administered medications for this visit.      ALLERGIES: Ampicillin and Penicillins  Family History  Problem Relation Age of Onset  . COPD Father        dec age 5/s-smoked  . Macular degeneration Father   . Diabetes Brother   . Other Brother        committed suicide age 36  . Macular  degeneration Brother   . Hyperlipidemia Mother   . Diabetes Maternal Grandmother   . Heart attack Paternal Grandfather     Social History   Socioeconomic History  . Marital status: Married    Spouse name: [name]  . Number of children: 2  . Years of education: college  . 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 Financial resource strain: Not on file  . Food insecurity:    Worry: Not on file    Inability: Not on file  . Transportation needs:    Medical: Not on file    Non-medical: Not on file  Tobacco Use  . Smoking status: Never Smoker  . Smokeless tobacco: Never Used  Substance and Sexual Activity  . Alcohol use: No    Alcohol/week: 0.0 oz  . Drug use: No  . Sexual activity: Not Currently    Partners: Male    Birth control/protection: Surgical    Comment: TAH--ovaries remain  Lifestyle  . Physical activity:    Days per week: Not on file    Minutes per session: Not on file  . Stress: Not on file  Relationships  . Social connections:    Talks on phone: Not on file    Gets together: Not on file    Attends religious service: Not on file    Active member of club or organization: Not on file    Attends meetings of clubs or organizations: Not on file    Relationship status: Not on file  . Intimate partner violence:    Fear of current or ex partner: Not on file    Emotionally abused: Not on file    Physically abused: Not on file    Forced sexual activity: Not on file  Other Topics Concern  . Not on file  Social History Narrative   Lives with husband   Caffeine- coffee, 2 cups daily    Review of Systems  Constitutional: Negative.   HENT: Negative.   Eyes: Negative.   Respiratory: Negative.   Cardiovascular: Negative.   Gastrointestinal: Negative.   Endocrine: Negative.   Genitourinary: Negative.   Musculoskeletal: Negative.   Skin: Negative.   Allergic/Immunologic: Negative.    Neurological: Negative.   Hematological: Negative.   Psychiatric/Behavioral: Negative.     PHYSICAL EXAMINATION:    BP 102/68 (BP Location: Right Arm, Patient Position: Sitting, Cuff Size: Normal)   Pulse 76   Resp 14   Ht 5' 8"  (1.727 m)  Wt 133 lb 6.4 oz (60.5 kg)   LMP 09/26/1995 (Within Months)   BMI 20.28 kg/m     General appearance: alert, cooperative and appears stated age   Pelvic: External genitalia:  no lesions              Urethra:  normal appearing urethra with no masses, tenderness or lesions              Bartholins and Skenes: normal                 Vagina: normal appearing vagina with normal color and discharge, no lesions.  No ulceration.               Cervix:  Absent.                  Bimanual Exam:  Uterus:  Absent.              Adnexa: no mass, fullness, tenderness                Chaperone was present for exam.  ASSESSMENT  Status post hysterectomy. Ovaries remain.  Vaginal vault prolapse.  Vaginal ulceration resolved with discontinuation of pesary. On immunosuppressive medication.  Revlimid.  On low dose ASA.  PLAN  Plan for robotic laparoscopic sacrocolpopexy, bilateral salpingo-oophorectomy, anterior and posterior colporrhaphy, TVT Exact midurethral sling, cystoscopy.  We talked about some of the details of her operative and post operative care.   We reviewed risks of surgery which include but are no limited to bleeding, infection, damage to surrounding organs, mesh erosions, urinary retention and need for self catheterization, pneumonia, DVT, PE, death, and risk of recurrence of her pelvic floor prolapse/incontinence.  I discussed her incisions for the procedure and her recovery activity level afterward.  No work for 6 weeks and no lifting over 10 pounds for 3 months post op.  She will stop Revlimid 2 weeks prior to surgery.  She will stop ASA 1 week prior to surgery.  Use vaginal estrogen cream 1/2 gram twice weekly.  Complete sleep study.   Return for preop visit.    An After Visit Summary was printed and given to the patient.  __25____ minutes face to face time of which over 50% was spent in counseling.

## 2018-03-26 ENCOUNTER — Encounter: Payer: Self-pay | Admitting: Internal Medicine

## 2018-03-26 DIAGNOSIS — G4733 Obstructive sleep apnea (adult) (pediatric): Secondary | ICD-10-CM

## 2018-04-01 ENCOUNTER — Other Ambulatory Visit: Payer: Self-pay | Admitting: Hematology

## 2018-04-01 DIAGNOSIS — C9000 Multiple myeloma not having achieved remission: Secondary | ICD-10-CM

## 2018-04-01 DIAGNOSIS — G4733 Obstructive sleep apnea (adult) (pediatric): Secondary | ICD-10-CM | POA: Diagnosis not present

## 2018-04-02 ENCOUNTER — Telehealth: Payer: Self-pay | Admitting: *Deleted

## 2018-04-02 DIAGNOSIS — G4733 Obstructive sleep apnea (adult) (pediatric): Secondary | ICD-10-CM

## 2018-04-02 NOTE — Telephone Encounter (Signed)
Pt aware results Referral placed to Dr. Britt Bottom in [facility]. Also requested a copy be sent to [facility]. Nothing further needed.

## 2018-04-03 ENCOUNTER — Other Ambulatory Visit: Payer: Self-pay

## 2018-04-03 DIAGNOSIS — C9000 Multiple myeloma not having achieved remission: Secondary | ICD-10-CM

## 2018-04-04 ENCOUNTER — Telehealth: Payer: Self-pay

## 2018-04-04 NOTE — Telephone Encounter (Signed)
Auth number for Revlimid refill is [id number].

## 2018-04-12 ENCOUNTER — Institutional Professional Consult (permissible substitution): Payer: 59 | Admitting: Pulmonary Disease

## 2018-04-18 ENCOUNTER — Telehealth: Payer: Self-pay | Admitting: Internal Medicine

## 2018-04-18 NOTE — Telephone Encounter (Signed)
[facility] is needing a clearance for patient They just need to know what did the last sleep study show along with the clearance for hysterectomy   It is scheduled for 05/14/18    Please fax back to :  [phone]

## 2018-04-18 NOTE — Telephone Encounter (Signed)
Sleep study faxed. Nothing further needed.

## 2018-04-24 ENCOUNTER — Telehealth: Payer: Self-pay | Admitting: *Deleted

## 2018-04-24 NOTE — Telephone Encounter (Signed)
Call from patient. Surgery instructions reviewed, including bowel prep day before surgery.  States previous colonoscopy prep has not produced good results per GI MD. Patient concerned she needs more than one bottle of Mag Citrate. Please advise.   Pre and post-op appointments scheduled. Printed copy of instructions will be provided at consult on 05-03-18.

## 2018-04-24 NOTE — Telephone Encounter (Signed)
Call to patient. Left message on voice mail ( per DPR) which has name confirmation. Left updated bowel prep instructions from Dr Quincy Simmonds.  Encounter closed.

## 2018-04-24 NOTE — Telephone Encounter (Signed)
Call to patient. Per ROI, can leave message, voice mail has number confirmation.  Left message calling to review surgery instructions and schedule appointments.

## 2018-04-24 NOTE — Telephone Encounter (Signed)
Ok for [employer] citrate 1 1/2 bottles.  Have her do only clear liquids the day prior to surgery also.

## 2018-04-30 ENCOUNTER — Other Ambulatory Visit: Payer: Self-pay | Admitting: Hematology

## 2018-04-30 DIAGNOSIS — C9000 Multiple myeloma not having achieved remission: Secondary | ICD-10-CM

## 2018-05-02 ENCOUNTER — Other Ambulatory Visit: Payer: Self-pay

## 2018-05-02 DIAGNOSIS — C9000 Multiple myeloma not having achieved remission: Secondary | ICD-10-CM

## 2018-05-02 NOTE — Progress Notes (Signed)
HEMATOLOGY/ONCOLOGY CLINIC NOTE  Date of Service: 05/03/18  PCP: .[name], MD  CC:  F/u for continue mx of multiple myeloma  CHIEF COMPLAINTS/PURPOSE OF CONSULTATION:  Follow up for multiple myeloma  DIAGNOSIS  IgA lambda R-ISS stage II multiple myeloma with innumerable lytic lesions in the calvarium, lesion in T7 vertebra and multiple lesions in the pelvis.  No significant bone pain at this time. Diagnosed in January 2017.  Current treatment  Restarting on Revlimid 66m po daily 3 weeks on 1 week off (after treatment interruption)   Previous Treatment  Status post Vd x 1 cycle VRd x 6 cycles  HD Melphalan 2054mm2 on 03/22/2016 Autologous HSCT on 03/23/2016 [facility]10^6[facility])  -Revlimid maintenance 1530mo daily    INTERVAL HISTORY:   Paula Johnson here for management and evaluation of her multiple myeloma. The patient's last visit with us [place] on 03/08/18. The pt reports that she is doing well overall.   The pt reports that she is off of Revlimid as of 04/30/18, two weeks prior to her uterine lift procedure on 05/14/18 with Dr. SilQuincy Simmondshe notes that six weeks after that she will be having a right foot bunionectomy on 06/25/18.   She denies any new bone pains or constitutional symptoms.   Lab results today (05/03/18) of CBC w/diff, CMP, and Reticulocytes is as follows: all values are WNL except for WBC at 3.1k, RDW at 15.3, ANC at 1.4k, CO2 at 21, Calcium at 8.2. MMP 05/03/18 is pending  On review of systems, pt reports good energy levels, and denies new bone pains, mouth sores, leg swelling, abdominal pains, and any other symptoms.    REVIEW OF SYSTEMS:    A 10+ POINT REVIEW OF SYSTEMS WAS OBTAINED including neurology, dermatology, psychiatry, cardiac, respiratory, lymph, extremities, GI, GU, Musculoskeletal, constitutional, breasts, reproductive, HEENT.  All pertinent positives are noted in the HPI.  All others are negative.     PHYSICAL EXAMINATION: NAD VS reviewed in EpiBeatricen no acute distress and comfortable SKIN: no acute rashes, no significant lesions EYES: conjunctiva are pink and non-injected, sclera anicteric OROPHARYNX: MMM, no exudates, no oropharyngeal erythema or ulceration NECK: supple, no JVD LYMPH:  no palpable lymphadenopathy in the cervical, axillary or inguinal regions LUNGS: clear to auscultation b/l with normal respiratory effort HEART: regular rate & rhythm ABDOMEN:  normoactive bowel sounds , non tender, not distended. No palpable hepatosplenomegaly.  Extremity: no pedal edema PSYCH: alert & oriented x 3 with fluent speech NEURO: no focal motor/sensory deficits   MEDICAL HISTORY:   Past Medical History:  Diagnosis Date  . Abnormal Pap smear of vagina 03/22/2017   ASCUS pap and negative HR HPV.  Patient is on immunosuppressive medication.   . Cancer ([place]   squamous cell carcinoma of thymus  . Fibroid    reason for Hysterectomy  . Migraine   . [place] ([facility]  . [place] [facility] ([facility] . Pain aggravated by activities of daily living    states she pulled something at right sternum in dec 2016  . PONV (postoperative nausea and vomiting)    thinks morphine caused nausea  . Prediabetes   . Urinary incontinence     SURGICAL HISTORY: Past Surgical History:  Procedure Laterality Date  . ABDOMINAL HYSTERECTOMY  1997   TAH--ovaries remain--Dr. ThoNewton Pigg THYMECTOMY  1999    SOCIAL HISTORY: Social History   Socioeconomic History  .  Marital status: Married    Spouse name: [name]  . Number of children: 2  . Years of education: college  . 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 Financial resource strain: Not on file  . Food insecurity:    Worry: Not on file    Inability: Not on file  . Transportation needs:    Medical: Not on file     Non-medical: Not on file  Tobacco Use  . Smoking status: Never Smoker  . Smokeless tobacco: Never Used  Substance and Sexual Activity  . Alcohol use: No    Alcohol/week: 0.0 standard drinks  . Drug use: No  . Sexual activity: Not Currently    Partners: Male    Birth control/protection: Surgical    Comment: TAH--ovaries remain  Lifestyle  . Physical activity:    Days per week: Not on file    Minutes per session: Not on file  . Stress: Not on file  Relationships  . Social connections:    Talks on phone: Not on file    Gets together: Not on file    Attends religious service: Not on file    Active member of club or organization: Not on file    Attends meetings of clubs or organizations: Not on file    Relationship status: Not on file  . Intimate partner violence:    Fear of current or ex partner: Not on file    Emotionally abused: Not on file    Physically abused: Not on file    Forced sexual activity: Not on file  Other Topics Concern  . Not on file  Social History Narrative   Lives with husband   Caffeine- coffee, 2 cups daily    FAMILY HISTORY: Family History  Problem Relation Age of Onset  . COPD Father        dec age 18/s-smoked  . Macular degeneration Father   . Diabetes Brother   . Other Brother        committed suicide age 32  . Macular degeneration Brother   . Hyperlipidemia Mother   . Diabetes Maternal Grandmother   . Heart attack Paternal Grandfather     ALLERGIES:  is allergic to ampicillin and penicillins.  MEDICATIONS:  . Current Outpatient Medications:  .  aspirin EC 81 MG tablet, Take 1 tablet (81 mg total) by mouth daily., Disp: , Rfl:  .  Calcium-Magnesium (CAL/MAG PO), Take 1 tablet by mouth 2 (two) times daily., Disp: , Rfl:  .  estradiol (ESTRACE) 0.1 MG/GM vaginal cream, Place 1 Applicatorful vaginally 2 (two) times a week. At hs (Patient taking differently: Place 1 Applicatorful vaginally 3 (three) times a week. At hs), Disp: 42.5 g, Rfl:  1 .  naproxen sodium (ALEVE) 220 MG tablet, Take 440 mg by mouth daily as needed (pain)., Disp: , Rfl:  .  REVLIMID 10 MG capsule, TAKE 1 CAPSULE BY MOUTH EVERY DAY (Patient not taking: Reported on 05/03/2018), Disp: 21 capsule, Rfl: 0   LABORATORY DATA:  I have reviewed the data as listed  . CBC Latest Ref Rng & Units 05/03/2018 03/08/2018 01/11/2018  WBC 3.9 - 10.3 K/uL 3.1(L) 3.2(L) 4.6  Hemoglobin 11.6 - 15.9 g/dL 12.1 12.9 12.6  Hematocrit 34.8 - 46.6 % 36.8 38.7 38.8  Platelets 145 - 400 K/uL 227 268 302    . CMP Latest Ref Rng & Units 05/03/2018 03/08/2018 01/11/2018  Glucose 70 - 99 mg/dL 90 95 144(H)  BUN 8 - 23 mg/dL 15 16 19   Creatinine 0.44 - 1.00 mg/dL 0.70 0.74 0.78  Sodium 135 - 145 mmol/L 138 138 140  Potassium 3.5 - 5.1 mmol/L 4.1 3.9 4.1  Chloride 98 - 111 mmol/L 110 105 109  CO2 22 - 32 mmol/L 21(L) 25 24  Calcium 8.9 - 10.3 mg/dL 8.2(L) 9.5 9.3  Total Protein 6.5 - 8.1 g/dL 7.2 7.8 7.8  Total Bilirubin 0.3 - 1.2 mg/dL 1.1 0.7 0.4  Alkaline Phos 38 - 126 U/L 63 79 84  AST 15 - 41 U/L 19 17 16   ALT 0 - 44 U/L 20 23 34     RADIOGRAPHIC STUDIES: I have personally reviewed the radiological images as listed and agreed with the findings in the report. No results found.  ASSESSMENT & PLAN:   61 y.o. caucasian female with  1. H/o Multiple myeloma - currently in remission.  IgA lambda R-ISS stage II multiple myeloma with innumerable lytic lesions in the calvarium, lesion in T7 vertebra and multiple lesions in the pelvis.  No significant bone pain at this time. Diagnosed in January 2017. s/p treatment as noted above 2.Pre-op assessment for myeloma  PLAN - MMP and SFLC shows patient still in remission from her myeloma -continue ASA hold 24m aspiring one week before surgery  -attention by surgery to peri-operative VTE prophylaxis with lovenox and early ambulation -Discussed pt labwork today, 05/03/18; HGB normal at 12.1, ANC at 1.4k -Will continue holding Revlimid  from 04/30/18 through her 05/14/18 uterine lift procedure and the 06/25/18 bunionectomy on 06/25/18 to decrease risk of clots -Will return to Revlimid one week after bunionectomy  -Will see pt back in 9 weeks    RTC with Dr KIrene Limboin 9 weeks with labs    The total time spent in the appt was 25 minutes and more than 50% was on counseling and direct patient cares.   GSullivan LoneMD Paula AAHIVMS SCook HospitalCTr[facility] LPHematology/Oncology Physician CComp[facility] (Office):       [phone](Work cell):  [phone](Fax):           [phone] I, SBaldwin Jamaica am acting as a scribe for Dr. KIrene Limbo .I have reviewed the above documentation for accuracy and completeness, and I agree with the above. .Brunetta GeneraMD

## 2018-05-03 ENCOUNTER — Encounter: Payer: Self-pay | Admitting: Obstetrics and Gynecology

## 2018-05-03 ENCOUNTER — Other Ambulatory Visit: Payer: Self-pay

## 2018-05-03 ENCOUNTER — Other Ambulatory Visit: Payer: Self-pay | Admitting: *Deleted

## 2018-05-03 ENCOUNTER — Inpatient Hospital Stay: Payer: 59

## 2018-05-03 ENCOUNTER — Ambulatory Visit (INDEPENDENT_AMBULATORY_CARE_PROVIDER_SITE_OTHER): Payer: 59 | Admitting: Obstetrics and Gynecology

## 2018-05-03 ENCOUNTER — Inpatient Hospital Stay: Payer: 59 | Attending: Hematology | Admitting: Hematology

## 2018-05-03 ENCOUNTER — Encounter: Payer: Self-pay | Admitting: Hematology

## 2018-05-03 VITALS — BP 109/69 | HR 64 | Temp 98.6°F | Resp 17 | Ht 68.0 in | Wt 131.2 lb

## 2018-05-03 VITALS — BP 100/78 | HR 74 | Resp 14 | Ht 68.0 in | Wt 131.0 lb

## 2018-05-03 DIAGNOSIS — C9001 Multiple myeloma in remission: Secondary | ICD-10-CM

## 2018-05-03 DIAGNOSIS — Z7982 Long term (current) use of aspirin: Secondary | ICD-10-CM | POA: Diagnosis not present

## 2018-05-03 DIAGNOSIS — C9000 Multiple myeloma not having achieved remission: Secondary | ICD-10-CM

## 2018-05-03 DIAGNOSIS — N993 Prolapse of vaginal vault after hysterectomy: Secondary | ICD-10-CM

## 2018-05-03 DIAGNOSIS — Z7189 Other specified counseling: Secondary | ICD-10-CM

## 2018-05-03 DIAGNOSIS — N393 Stress incontinence (female) (male): Secondary | ICD-10-CM

## 2018-05-03 LAB — CMP (CANCER CENTER ONLY)
ALBUMIN: 3.7 g/dL (ref 3.5–5.0)
ALT: 20 U/L (ref 0–44)
AST: 19 U/L (ref 15–41)
Alkaline Phosphatase: 63 U/L (ref 38–126)
Anion gap: 7 (ref 5–15)
BILIRUBIN TOTAL: 1.1 mg/dL (ref 0.3–1.2)
BUN: 15 mg/dL (ref 8–23)
CHLORIDE: 110 mmol/L (ref 98–111)
CO2: 21 mmol/L — ABNORMAL LOW (ref 22–32)
CREATININE: 0.7 mg/dL (ref 0.44–1.00)
Calcium: 8.2 mg/dL — ABNORMAL LOW (ref 8.9–10.3)
GFR, Est AFR Am: >60 mL/min (ref >60)
GLUCOSE: 90 mg/dL (ref 70–99)
POTASSIUM: 4.1 mmol/L (ref 3.5–5.1)
Sodium: 138 mmol/L (ref 135–145)
Total Protein: 7.2 g/dL (ref 6.5–8.1)

## 2018-05-03 LAB — CBC WITH DIFFERENTIAL (CANCER CENTER ONLY)
Basophils Absolute: 0 10*3/uL (ref 0.0–0.1)
Basophils Relative: 1 %
EOS PCT: 4 %
Eosinophils Absolute: 0.1 10*3/uL (ref 0.0–0.5)
HCT: 36.8 % (ref 34.8–46.6)
Hemoglobin: 12.1 g/dL (ref 11.6–15.9)
LYMPHS ABS: 1.1 10*3/uL (ref 0.9–3.3)
LYMPHS PCT: 37 %
MCH: 30.3 pg (ref 25.1–34.0)
MCHC: 32.9 g/dL (ref 31.5–36.0)
MCV: 92.2 fL (ref 79.5–101.0)
MONO ABS: 0.4 10*3/uL (ref 0.1–0.9)
Monocytes Relative: 14 %
Neutro Abs: 1.4 10*3/uL — ABNORMAL LOW (ref 1.5–6.5)
Neutrophils Relative %: 44 %
PLATELETS: 227 10*3/uL (ref 145–400)
RBC: 3.99 MIL/uL (ref 3.70–5.45)
RDW: 15.3 % — AB (ref 11.2–14.5)
WBC Count: 3.1 10*3/uL — ABNORMAL LOW (ref 3.9–10.3)

## 2018-05-03 LAB — RETICULOCYTES
RBC.: 3.99 MIL/uL (ref 3.70–5.45)
RETIC COUNT ABSOLUTE: 47.9 10*3/uL (ref 33.7–90.7)
Retic Ct Pct: 1.2 % (ref 0.7–2.1)

## 2018-05-03 MED ORDER — SODIUM CHLORIDE 0.9 % IV SOLN
INTRAVENOUS | Status: DC
  Administered 2018-05-03: 12:00:00 via INTRAVENOUS
  Filled 2018-05-03 (×2): qty 250

## 2018-05-03 MED ORDER — ZOLEDRONIC ACID 4 MG/100ML IV SOLN
4.0000 mg | Freq: Once | INTRAVENOUS | Status: AC
  Administered 2018-05-03: 4 mg via INTRAVENOUS
  Filled 2018-05-03: qty 100

## 2018-05-03 MED ORDER — SODIUM CHLORIDE 0.9 % IV SOLN
INTRAVENOUS | Status: DC
  Administered 2018-05-03: 12:00:00 via INTRAVENOUS
  Filled 2018-05-03: qty 250

## 2018-05-03 NOTE — Progress Notes (Signed)
GYNECOLOGY  VISIT   HPI: 62 y.o.   Married  Caucasian  female   G2P2002 with Patient's last menstrual period was 09/26/1995 (within months).   here for surgical consult robotic laparoscopic sacrocolpopexy, bilateral salpingo-oophorectomy, anterior and posterior colporrhaphy, TVT Exact midurethral sling, cystoscopy 05/14/2018.   States she is prepared for surgery and understands the plan.   Has vaginal vault eversion and urinary stress incontinence. Intolerant of pessary due to chronic granulation tissue.  Has some constipation.  Takes Calms which has magnesium citrate in it.  No splinting.  No fecal soiling.   Multichannel urodynamic testing with reduction of prolapse. Uroflow - Void 492 cc.  PVR 10 cc.  CMG - S1 212 cc, S2 274 cc, S3 337 cc, S4 447 cc. Stable CMG.  VLPP 101 cmH20 at 452 cc.  UPP - 23 cm H20. Pressure flow - voided 492 cc. No Pdetmax measured.  CT abdomen/pelvis 07/2017 - normal adnexa.  Uterus absent.  Fatty depoist of the liver.  Scattered skeletal lucencies c/w multiple myeloma.  Recent sleep study showing mild sleep apnea.  Saw her dentist for modification of her oral device.   Did stop Revlimid 2 weeks prior to surgery to reduce immunosupression; stopped 04/30/18. Was taking ASA 81 mg to reduce clotting while on Revlimid.  Will stop this in 3 days.   Preop at hospital next week.   Will see her oncologist on 05/07/18 at [facility] who did her bone marrow transplant.   GYNECOLOGIC HISTORY: Patient's last menstrual period was 09/26/1995 (within months). Contraception:  Hysterectomy 1997 Menopausal hormone therapy:   Vaginal estrogen cream. Last mammogram:  01/11/2018 B[facility] 1:negative Last pap smear:   03/22/2017 ASCUS HR HPV neative        OB History    Gravida  2   Para  2   Term  2   Preterm      AB      Living  2     SAB      TAB      Ectopic      Multiple      Live Births                 Patient Active Problem List   Diagnosis Date  Noted  . Cystocele, unspecified (CODE) 03/06/2018  . Vaginal granulation tissue 12/03/2017  . Urinary tract infection without hematuria   . Muscular fasciculation   . Other fatigue   . Abdominal pain   . FTT (failure to thrive) in adult 08/09/2017  . Counseling regarding advanced care planning and goals of care 08/03/2017  . Gastroesophageal reflux disease without esophagitis 01/17/2017  . S/P autologous bone marrow transplantation ([place]) 03/29/2016  . Near syncope 01/04/2016  . Peripheral edema 12/19/2015  . Hematuria 11/08/2015  . Rash 11/01/2015  . Multiple myeloma ([place]) 10/19/2015  . Metastatic multiple myeloma to bone ([place]) 10/19/2015  . Plasma cell dyscrasia 10/05/2015  . Anemia 09/29/2015  . Hypergammaglobulinemia 09/29/2015  . HYPOXEMIA 12/16/2007  . HYPERLIPIDEMIA 12/04/2007  . DYSPNEA 12/04/2007  . MALIGNANT NEOPLASM OF THYMUS 12/03/2007  . OBSTRUCTIVE SLEEP APNEA 12/03/2007  . Restless leg syndrome 12/03/2007    Past Medical History:  Diagnosis Date  . Abnormal Pap smear of vagina 03/22/2017   ASCUS pap and negative HR HPV.  Patient is on immunosuppressive medication.   . Cancer ([place]) 1999   squamous cell carcinoma of thymus  . Fibroid    reason for Hysterectomy  . Migraine   .  Multiple myeloma ([place]) 08/2015  . Multiple myeloma ([place])   . Pain aggravated by activities of daily living    states she pulled something at right sternum in dec 2016  . PONV (postoperative nausea and vomiting)    thinks morphine caused nausea  . Prediabetes   . Urinary incontinence     Past Surgical History:  Procedure Laterality Date  . ABDOMINAL HYSTERECTOMY  1997   TAH--ovaries remain--Dr. Newton Pigg  . THYMECTOMY  1999    Current Outpatient Medications  Medication Sig Dispense Refill  . aspirin EC 81 MG tablet Take 1 tablet (81 mg total) by mouth daily.    . Calcium-Magnesium (CAL/MAG PO) Take 1 tablet by mouth 2 (two) times daily.    Marland Kitchen estradiol (ESTRACE) 0.1 MG/GM  vaginal cream Place 1 Applicatorful vaginally 2 (two) times a week. At hs (Patient taking differently: Place 1 Applicatorful vaginally 3 (three) times a week. At hs) 42.5 g 1  . naproxen sodium (ALEVE) 220 MG tablet Take 440 mg by mouth daily as needed (pain).    . REVLIMID 10 MG capsule TAKE 1 CAPSULE BY MOUTH EVERY DAY (Patient not taking: Reported on 05/03/2018) 21 capsule 0   No current facility-administered medications for this visit.      ALLERGIES: Ampicillin and Penicillins  Family History  Problem Relation Age of Onset  . COPD Father        dec age 70/s-smoked  . Macular degeneration Father   . Diabetes Brother   . Other Brother        committed suicide age 46  . Macular degeneration Brother   . Hyperlipidemia Mother   . Diabetes Maternal Grandmother   . Heart attack Paternal Grandfather     Social History   Socioeconomic History  . Marital status: Married    Spouse name: [name]  . Number of children: 2  . Years of education: college  . 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 Financial resource strain: Not on file  . Food insecurity:    Worry: Not on file    Inability: Not on file  . Transportation needs:    Medical: Not on file    Non-medical: Not on file  Tobacco Use  . Smoking status: Never Smoker  . Smokeless tobacco: Never Used  Substance and Sexual Activity  . Alcohol use: No    Alcohol/week: 0.0 standard drinks  . Drug use: No  . Sexual activity: Not Currently    Partners: Male    Birth control/protection: Surgical    Comment: TAH--ovaries remain  Lifestyle  . Physical activity:    Days per week: Not on file    Minutes per session: Not on file  . Stress: Not on file  Relationships  . Social connections:    Talks on phone: Not on file    Gets together: Not on file    Attends religious service: Not on file    Active member of club or organization: Not on file     Attends meetings of clubs or organizations: Not on file    Relationship status: Not on file  . Intimate partner violence:    Fear of current or ex partner: Not on file    Emotionally abused: Not on file    Physically abused: Not on file    Forced sexual activity: Not on file  Other Topics Concern  . Not on  file  Social History Narrative   Lives with husband   Caffeine- coffee, 2 cups daily    Review of Systems  Constitutional: Negative.   HENT: Negative.   Eyes: Negative.   Respiratory: Negative.   Cardiovascular: Negative.   Gastrointestinal: Negative.   Endocrine: Negative.   Genitourinary: Negative.   Musculoskeletal: Negative.   Skin: Negative.   Allergic/Immunologic: Negative.   Neurological: Negative.   Hematological: Negative.   Psychiatric/Behavioral: Negative.   All other systems reviewed and are negative.   PHYSICAL EXAMINATION:    BP 100/78   Pulse 74   Resp 14   Ht _0  (1.727 m)   Wt 131 lb (59.4 kg)   LMP 09/26/1995 (Within Months)   BMI 19.92 kg/m     General appearance: alert, cooperative and appears stated age Head: Normocephalic, without obvious abnormality, atraumatic Neck: no adenopathy, supple, symmetrical, trachea midline and thyroid normal to inspection and palpation Lungs: clear to auscultation bilaterally Heart: regular rate and rhythm Abdomen: soft, non-tender, no masses,  no organomegaly Extremities: extremities normal, atraumatic, no cyanosis or edema Skin: Skin color, texture, turgor normal. No rashes or lesions No abnormal inguinal nodes palpated Neurologic: Grossly normal  Pelvic: External genitalia:  no lesions              Urethra:  normal appearing urethra with no masses, tenderness or lesions              Bartholins and Skenes: normal                 Vagina: normal appearing vagina with normal color and discharge.  Vault eversion noted.               Cervix:  Absent.  One tiny 2 mm red dot at vaginal apex.                 Bimanual Exam:  Uterus:   Absent.              Adnexa: no mass, fullness, tenderness              Rectal exam: Yes.  .  Confirms.              Anus:  normal sphincter tone, no lesions  Chaperone was present for exam.  ASSESSMENT  Vaginal vault prolapse.  Stress incontinence.  Hx multiple myeloma.   PLAN  Proceed with robotic laparoscopic sacrocolpopexy, bilateral salpingo-oophorectomy, anterior and posterior colporrhaphy, TVT Exact midurethral sling, cystoscopy 05/14/2018.   Risks, benefits, and alternatives discussed with the patient who wishes to proceed.  Will do Mg Citrate prep 1.5 bottles.   An After Visit Summary was printed and given to the patient.

## 2018-05-03 NOTE — Progress Notes (Signed)
k

## 2018-05-03 NOTE — Progress Notes (Signed)
Okay to give zometa w/ CorCa = 8.2 per [name]. Patient educated to eat calcium rich food and continue supplements (she thinks she is taking 600mg  BID).   Demetrius Charity, PharmD Oncology Pharmacist  Pharmacy Phone: [phone] 05/03/2018

## 2018-05-03 NOTE — Patient Instructions (Signed)

## 2018-05-03 NOTE — Patient Instructions (Signed)
Paula Johnson  05/03/2018   Your procedure is scheduled on: 03-14-18  Report to [facility] Main  Entrance  Report to admitting at  530 AM    Call this number if you have problems the morning of surgery [phone]   Remember: Do not eat food or drink liquids :After Midnight.     Take these medicines the morning of surgery with A SIP OF WATER: none              You may not have any metal on your body including hair pins and              piercings  Do not wear jewelry, make-up, lotions, powders or perfumes, deodorant             Do not wear nail polish.  Do not shave  48 hours prior to surgery.              Men may shave face and neck.   Do not bring valuables to the hospital. [place].  Contacts, dentures or bridgework may not be worn into surgery.  Leave suitcase in the car. After surgery it may be brought to your room.                  Please read over the following fact sheets you were given: _____________________________________________________________________             [facility] - Preparing for Surgery Before surgery, you can play an important role.  Because skin is not sterile, your skin needs to be as free of germs as possible.  You can reduce the number of germs on your skin by washing with CHG (chlorahexidine gluconate) soap before surgery.  CHG is an antiseptic cleaner which kills germs and bonds with the skin to continue killing germs even after washing. Please DO NOT use if you have an allergy to CHG or antibacterial soaps.  If your skin becomes reddened/irritated stop using the CHG and inform your nurse when you arrive at Short Stay. Do not shave (including legs and underarms) for at least 48 hours prior to the first CHG shower.  You may shave your face/neck. Please follow these instructions carefully:  1.  Shower with CHG Soap the night before surgery and the  morning of Surgery.  2.  If  you choose to wash your hair, wash your hair first as usual with your  normal  shampoo.  3.  After you shampoo, rinse your hair and body thoroughly to remove the  shampoo.                           4.  Use CHG as you would any other liquid soap.  You can apply chg directly  to the skin and wash                       Gently with a scrungie or clean washcloth.  5.  Apply the CHG Soap to your body ONLY FROM THE NECK DOWN.   Do not use on face/ open  Wound or open sores. Avoid contact with eyes, ears mouth and genitals (private parts).                       Wash face,  Genitals (private parts) with your normal soap.             6.  Wash thoroughly, paying special attention to the area where your surgery  will be performed.  7.  Thoroughly rinse your body with warm water from the neck down.  8.  DO NOT shower/wash with your normal soap after using and rinsing off  the CHG Soap.                9.  Pat yourself dry with a clean towel.            10.  Wear clean pajamas.            11.  Place clean sheets on your bed the night of your first shower and do not  sleep with pets. Day of Surgery : Do not apply any lotions/deodorants the morning of surgery.  Please wear clean clothes to the hospital/surgery center.  FAILURE TO FOLLOW THESE INSTRUCTIONS MAY RESULT IN THE CANCELLATION OF YOUR SURGERY PATIENT SIGNATURE_________________________________  NURSE SIGNATURE__________________________________  ________________________________________________________________________

## 2018-05-06 ENCOUNTER — Telehealth: Payer: Self-pay

## 2018-05-06 LAB — MULTIPLE MYELOMA PANEL, SERUM
ALBUMIN SERPL ELPH-MCNC: 3.5 g/dL (ref 2.9–4.4)
ALPHA2 GLOB SERPL ELPH-MCNC: 0.6 g/dL (ref 0.4–1.0)
Albumin/Glob SerPl: 1.1 (ref 0.7–1.7)
Alpha 1: 0.2 g/dL (ref 0.0–0.4)
B-GLOBULIN SERPL ELPH-MCNC: 1.1 g/dL (ref 0.7–1.3)
GAMMA GLOB SERPL ELPH-MCNC: 1.3 g/dL (ref 0.4–1.8)
GLOBULIN, TOTAL: 3.2 g/dL (ref 2.2–3.9)
IGG (IMMUNOGLOBIN G), SERUM: 1220 mg/dL (ref 700–1600)
IgA: 436 mg/dL — ABNORMAL HIGH (ref 87–352)
IgM (Immunoglobulin M), Srm: 37 mg/dL (ref 26–217)
Total Protein ELP: 6.7 g/dL (ref 6.0–8.5)

## 2018-05-06 LAB — KAPPA/LAMBDA LIGHT CHAINS
KAPPA FREE LGHT CHN: 33.4 mg/L — AB (ref 3.3–19.4)
Kappa, lambda light chain ratio: 1.16 (ref 0.26–1.65)
Lambda free light chains: 28.9 mg/L — ABNORMAL HIGH (ref 5.7–26.3)

## 2018-05-06 NOTE — Telephone Encounter (Signed)
Left a detail message concerning patient return visit. Per 8/9 los Mailed a letter with a calender enclosed.

## 2018-05-07 ENCOUNTER — Other Ambulatory Visit: Payer: Self-pay | Admitting: Hematology

## 2018-05-07 ENCOUNTER — Telehealth: Payer: Self-pay | Admitting: Obstetrics and Gynecology

## 2018-05-07 ENCOUNTER — Other Ambulatory Visit: Payer: Self-pay

## 2018-05-07 ENCOUNTER — Encounter (HOSPITAL_COMMUNITY): Payer: Self-pay

## 2018-05-07 ENCOUNTER — Encounter (HOSPITAL_COMMUNITY): Payer: Self-pay | Admitting: *Deleted

## 2018-05-07 ENCOUNTER — Encounter (HOSPITAL_COMMUNITY)
Admission: RE | Admit: 2018-05-07 | Discharge: 2018-05-07 | Disposition: A | Discharge status: Home / Self Care | Payer: 59 | Source: Ambulatory Visit | Attending: Obstetrics and Gynecology | Admitting: Obstetrics and Gynecology

## 2018-05-07 DIAGNOSIS — Z01818 Encounter for other preprocedural examination: Secondary | ICD-10-CM | POA: Insufficient documentation

## 2018-05-07 DIAGNOSIS — D709 Neutropenia, unspecified: Secondary | ICD-10-CM

## 2018-05-07 HISTORY — DX: Aneurysm of heart: I25.3

## 2018-05-07 NOTE — Telephone Encounter (Signed)
Call to patient. Left message to call back.  

## 2018-05-07 NOTE — Progress Notes (Addendum)
DR OSSEY MDA SAW PATIENT FOR ANESTHESIA CONSULT . PATIENT REQUEST FOR DR Conrad  FOR ANESTHESIA FOR 05-14-18 PLACED ON OR SCHEDULE.  CBC, CMET, -05-03-18 Epic LOV DR KALE ONCOLOGY 05-03-18 Epic ECHO 11-30-17 (DR OSSEY SAE ECHO) EKG 11-12-17 Epic CHEST XRAY 08-10-17 EPIC

## 2018-05-07 NOTE — Telephone Encounter (Signed)
Please contact patient in follow up to her recent labs drawn at oncology on 05/03/18.  She is mildly neutropenic and has low calcium.   I spoke with her oncologist just now who states the neutropenia is likely from her Revlimid and would expect to be resolved in 1 - 2 weeks post discontinuation.  (She stopped the Revlimid on 04/30/18.)  Please have her come to the office on for a recheck appointment with me and CBC with differential for [place] to reassess her level and talk about how this could affect her surgical care.

## 2018-05-08 ENCOUNTER — Other Ambulatory Visit (HOSPITAL_COMMUNITY): Payer: 59

## 2018-05-08 NOTE — Telephone Encounter (Signed)
Call to patient. Notified of results and recommendations from Dr Quincy Simmonds. 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  Results will be faxed to [place]. Consult to discuss results scheduled for 05-09-18 at 1230.  Encounter closed.

## 2018-05-09 ENCOUNTER — Encounter: Payer: Self-pay | Admitting: Obstetrics and Gynecology

## 2018-05-09 ENCOUNTER — Ambulatory Visit (INDEPENDENT_AMBULATORY_CARE_PROVIDER_SITE_OTHER): Payer: 59 | Admitting: Obstetrics and Gynecology

## 2018-05-09 VITALS — BP 114/68 | HR 80 | Resp 16 | Ht 68.0 in | Wt 131.0 lb

## 2018-05-09 DIAGNOSIS — D72819 Decreased white blood cell count, unspecified: Secondary | ICD-10-CM

## 2018-05-09 NOTE — Progress Notes (Signed)
GYNECOLOGY  VISIT   HPI: 62 y.o.   Married  Caucasian  female   G2P2002 with Patient's last menstrual period was 09/26/1995 (within months).   here to discuss results     Recent labs with oncology showed neutropenia at 1400.   Repeat CBC with diff now at her place of work, [employer], showing WBC 3.9 and absolute neutrophil count at 1900.  Off Revlimid since 04/30/18.  GYNECOLOGIC HISTORY: Patient's last menstrual period was 09/26/1995 (within months). Contraception:  Hysterectomy Menopausal hormone therapy:  Vaginal estrogen cream Last mammogram:  01/11/2018 BIRAD 1:negative Last pap smear:   03/22/2017 ASCUS HR HPV neative        OB History    Gravida  2   Para  2   Term  2   Preterm      AB      Living  2     SAB      TAB      Ectopic      Multiple      Live Births                 Patient Active Problem List   Diagnosis Date Noted  . Cystocele, unspecified (CODE) 03/06/2018  . Vaginal granulation tissue 12/03/2017  . Urinary tract infection without hematuria   . Muscular fasciculation   . Other fatigue   . Abdominal pain   . FTT (failure to thrive) in adult 08/09/2017  . Counseling regarding advanced care planning and goals of care 08/03/2017  . Gastroesophageal reflux disease without esophagitis 01/17/2017  . S/P autologous bone marrow transplantation ([place]) 03/29/2016  . Near syncope 01/04/2016  . Peripheral edema 12/19/2015  . Hematuria 11/08/2015  . Rash 11/01/2015  . Multiple myeloma ([place]) 10/19/2015  . Metastatic multiple myeloma to bone ([place]) 10/19/2015  . Plasma cell dyscrasia 10/05/2015  . Anemia 09/29/2015  . Hypergammaglobulinemia 09/29/2015  . HYPOXEMIA 12/16/2007  . HYPERLIPIDEMIA 12/04/2007  . DYSPNEA 12/04/2007  . MALIGNANT NEOPLASM OF THYMUS 12/03/2007  . OBSTRUCTIVE SLEEP APNEA 12/03/2007  . Restless leg syndrome 12/03/2007    Past Medical History:  Diagnosis Date  . Abnormal Pap smear of vagina 03/22/2017   ASCUS  pap and negative HR HPV.  Patient is on immunosuppressive medication.   . Atrial septal aneurysm per 11-30-17 echo   trivial pericardial effusion  . Cancer ([place]) 1999   squamous cell carcinoma of thymus  . Fibroid    reason for Hysterectomy  . Migraine    episode of aphasia worked up with echo by [facility] 11-23-17, has visual migraines  . Multiple myeloma ([place]) 08/2015  . Multiple myeloma ([place])   . PONV (postoperative nausea and vomiting)    thinks morphine caused nausea  . Sleep apnea    uses old oral allialnce for osa due to cpap intolerance  . Urinary incontinence     Past Surgical History:  Procedure Laterality Date  . ABDOMINAL HYSTERECTOMY  1997   TAH--ovaries remain--Dr. Newton Pigg  . BONE MARROW TRANSPLANT  2017   done at [facility]  . THYMECTOMY  1999    Current Outpatient Medications  Medication Sig Dispense Refill  . aspirin EC 81 MG tablet Take 1 tablet (81 mg total) by mouth daily.    . Calcium-Magnesium (CAL/MAG PO) Take 1 tablet by mouth 2 (two) times daily.    Marland Kitchen estradiol (ESTRACE) 0.1 MG/GM vaginal cream Place 1 Applicatorful vaginally 2 (two) times a week. At hs (Patient taking differently: Place  1 Applicatorful vaginally 3 (three) times a week. At hs) 42.5 g 1  . naproxen sodium (ALEVE) 220 MG tablet Take 440 mg by mouth daily as needed (pain).    . REVLIMID 10 MG capsule TAKE 1 CAPSULE BY MOUTH EVERY DAY (Patient not taking: Reported on 05/09/2018) 21 capsule 0   No current facility-administered medications for this visit.      ALLERGIES: Ampicillin and Penicillins  Family History  Problem Relation Age of Onset  . COPD Father        dec age 76/s-smoked  . Macular degeneration Father   . Diabetes Brother   . Other Brother        committed suicide age 77  . Macular degeneration Brother   . Hyperlipidemia Mother   . Diabetes Maternal Grandmother   . Heart attack Paternal Grandfather     Social History   Socioeconomic History  . Marital  status: Married    Spouse name: [name]  . Number of children: 2  . Years of education: college  . 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 Financial resource strain: Not on file  . Food insecurity:    Worry: Not on file    Inability: Not on file  . Transportation needs:    Medical: Not on file    Non-medical: Not on file  Tobacco Use  . Smoking status: Never Smoker  . Smokeless tobacco: Never Used  Substance and Sexual Activity  . Alcohol use: Yes    Alcohol/week: 0.0 standard drinks    Comment: once a year drink  . Drug use: Never  . Sexual activity: Not Currently    Partners: Male    Birth control/protection: Surgical    Comment: TAH--ovaries remain  Lifestyle  . Physical activity:    Days per week: Not on file    Minutes per session: Not on file  . Stress: Not on file  Relationships  . Social connections:    Talks on phone: Not on file    Gets together: Not on file    Attends religious service: Not on file    Active member of club or organization: Not on file    Attends meetings of clubs or organizations: Not on file    Relationship status: Not on file  . Intimate partner violence:    Fear of current or ex partner: Not on file    Emotionally abused: Not on file    Physically abused: Not on file    Forced sexual activity: Not on file  Other Topics Concern  . Not on file  Social History Narrative   Lives with husband   Caffeine- coffee, 2 cups daily    Review of Systems  All other systems reviewed and are negative.   PHYSICAL EXAMINATION:    BP 114/68 (BP Location: Right Arm, Patient Position: Sitting, Cuff Size: Normal)   Pulse 80   Resp 16   Ht 5' 8"  (1.727 m)   Wt 131 lb (59.4 kg)   LMP 09/26/1995 (Within Months)   BMI 19.92 kg/m     General appearance: alert, cooperative and appears stated age   ASSESSMENT  Leukopenia.   No neutropenia.  Off Revlimid.    PLAN  Ok to proceed forward with surgery.  Discussion of importance to not have neutropenia at time of surgery.  Plan for Lovenox.  Cipro and Flagyl for IV prophylaxis.  An After Visit Summary was printed and given to the patient.  ___15___ minutes face to face time of which over 50% was spent in counseling.

## 2018-05-09 NOTE — Progress Notes (Signed)
Cbc with dif received by fax from dr Quincy Simmonds office and placed on patient chart

## 2018-05-09 NOTE — Progress Notes (Signed)
cmet 05-03-18 epic

## 2018-05-13 ENCOUNTER — Telehealth: Payer: Self-pay | Admitting: Obstetrics and Gynecology

## 2018-05-13 DIAGNOSIS — M2040 Other hammer toe(s) (acquired), unspecified foot: Secondary | ICD-10-CM

## 2018-05-13 DIAGNOSIS — M21619 Bunion of unspecified foot: Secondary | ICD-10-CM | POA: Insufficient documentation

## 2018-05-13 HISTORY — DX: Bunion of unspecified foot: M21.619

## 2018-05-13 HISTORY — DX: Other hammer toe(s) (acquired), unspecified foot: M20.40

## 2018-05-13 NOTE — Telephone Encounter (Signed)
Ok to take the remaining 1/2 bottle of magnesium citrate.

## 2018-05-13 NOTE — Telephone Encounter (Signed)
Patient notified. Encounter closed

## 2018-05-13 NOTE — Telephone Encounter (Signed)
Patient is asking to talk with Dr.Silva's nurse regarding her surgery tomorrow. No details given.

## 2018-05-13 NOTE — Telephone Encounter (Signed)
Call to patient. Completes magnesium citrate 1.5 bottles in preparation for surgery tomorrow.  Has had no results from this regimen. Last BM was this morning. Usually has daily BM.  Calling to see if anything else needed. Still has 1/2 bottle of mag citrate.

## 2018-05-13 NOTE — H&P (Signed)
Office Visit   05/03/2018 Paula Johnson, [name], MD  Obstetrics and Gynecology   Vaginal vault prolapse after hysterectomy +1 more  Dx   Consult ; Referred by [name], MD  Reason for Visit   Additional Documentation   Vitals:   BP 100/78   Pulse 74   Resp 14   Ht _0  (1.727 m)   Wt 59.4 kg   LMP 09/26/1995 (Within Months)   BMI 19.92 kg/m   BSA 1.69 m     More Vitals   Flowsheets:   Anthropometrics,   MEWS Score     Encounter Info:   Billing Info,   History,   Allergies,   Detailed Report     All Notes   Progress Notes by [name], MD at 05/03/2018 8:45 AM  Author: [name], MD Author Type: Physician Filed: 05/05/2018 7:44 PM  Note Status: Signed Cosign: Cosign Not Required Encounter Date: 05/03/2018  Editor: [name], MD (Physician)  Prior Versions: 1. [name], CMA (Certified Medical Assistant) at 05/03/2018 8:47 AM - Sign at close encounter    GYNECOLOGY  VISIT   HPI: 62 y.o.   Married  Caucasian  female   G2P2002 with Patient's last menstrual period was 09/26/1995 (within months).   here for surgical consult robotic laparoscopic sacrocolpopexy, bilateral salpingo-oophorectomy, anterior and posterior colporrhaphy, TVT Exact midurethral sling, cystoscopy 05/14/2018.   States she is prepared for surgery and understands the plan.   Has vaginal vault eversion and urinary stress incontinence. Intolerant of pessary due to chronic granulation tissue.  Has some constipation.  Takes Calms which has magnesium citrate in it.  No splinting.  No fecal soiling.   Multichannel urodynamic testing with reduction of prolapse. Uroflow - Void 492 cc. PVR 10 cc.  CMG - S1 212 cc, S2 274 cc, S3 337 cc, S4 447 cc. Stable CMG. VLPP 101 cmH20 at 452 cc.  UPP - 23 cm H20. Pressure flow - voided 492 cc. No Pdetmax measured.  CT abdomen/pelvis 07/2017 - normal adnexa.   Uterus absent.  Fatty depoist of the liver.  Scattered skeletal lucencies c/w multiple myeloma.  Recent sleep study showing mild sleep apnea.  Saw her dentist for modification of her oral device.   Did stop Revlimid 2 weeks prior to surgery to reduce immunosupression; stopped 04/30/18. Was taking ASA 81 mg to reduce clotting while on Revlimid. Will stop this in 3 days.   Preop at hospital next week.   Will see her oncologist on 05/07/18 at [facility] who did her bone marrow transplant.   GYNECOLOGIC HISTORY: Patient's last menstrual period was 09/26/1995 (within months). Contraception:  Hysterectomy 1997 Menopausal hormone therapy:   Vaginal estrogen cream. Last mammogram:  01/11/2018 BIRAD 1:negative Last pap smear:   03/22/2017 ASCUS HR HPV neative                OB History    Gravida  2   Para  2   Term  2   Preterm      AB      Living  2     SAB      TAB      Ectopic      Multiple      Live Births                     Patient Active  Problem List   Diagnosis Date Noted  . Cystocele, unspecified (CODE) 03/06/2018  . Vaginal granulation tissue 12/03/2017  . Urinary tract infection without hematuria   . Muscular fasciculation   . Other fatigue   . Abdominal pain   . FTT (failure to thrive) in adult 08/09/2017  . Counseling regarding advanced care planning and goals of care 08/03/2017  . Gastroesophageal reflux disease without esophagitis 01/17/2017  . S/P autologous bone marrow transplantation ([place]) 03/29/2016  . Near syncope 01/04/2016  . Peripheral edema 12/19/2015  . Hematuria 11/08/2015  . Rash 11/01/2015  . Multiple myeloma ([place]) 10/19/2015  . Metastatic multiple myeloma to bone ([place]) 10/19/2015  . Plasma cell dyscrasia 10/05/2015  . Anemia 09/29/2015  . Hypergammaglobulinemia 09/29/2015  . HYPOXEMIA 12/16/2007  . HYPERLIPIDEMIA 12/04/2007  . DYSPNEA 12/04/2007  . MALIGNANT NEOPLASM OF THYMUS 12/03/2007  . OBSTRUCTIVE SLEEP APNEA  12/03/2007  . Restless leg syndrome 12/03/2007        Past Medical History:  Diagnosis Date  . Abnormal Pap smear of vagina 03/22/2017   ASCUS pap and negative HR HPV.  Patient is on immunosuppressive medication.   . Cancer ([place]) 1999   squamous cell carcinoma of thymus  . Fibroid    reason for Hysterectomy  . Migraine   . Multiple myeloma ([place]) 08/2015  . Multiple myeloma ([place])   . Pain aggravated by activities of daily living    states she pulled something at right sternum in dec 2016  . PONV (postoperative nausea and vomiting)    thinks morphine caused nausea  . Prediabetes   . Urinary incontinence          Past Surgical History:  Procedure Laterality Date  . ABDOMINAL HYSTERECTOMY  1997   TAH--ovaries remain--Dr. Newton Pigg  . THYMECTOMY  1999          Current Outpatient Medications  Medication Sig Dispense Refill  . aspirin EC 81 MG tablet Take 1 tablet (81 mg total) by mouth daily.    . Calcium-Magnesium (CAL/MAG PO) Take 1 tablet by mouth 2 (two) times daily.    Marland Kitchen estradiol (ESTRACE) 0.1 MG/GM vaginal cream Place 1 Applicatorful vaginally 2 (two) times a week. At hs (Patient taking differently: Place 1 Applicatorful vaginally 3 (three) times a week. At hs) 42.5 g 1  . naproxen sodium (ALEVE) 220 MG tablet Take 440 mg by mouth daily as needed (pain).    . REVLIMID 10 MG capsule TAKE 1 CAPSULE BY MOUTH EVERY DAY (Patient not taking: Reported on 05/03/2018) 21 capsule 0   No current facility-administered medications for this visit.      ALLERGIES: Ampicillin and Penicillins       Family History  Problem Relation Age of Onset  . COPD Father        dec age 64/s-smoked  . Macular degeneration Father   . Diabetes Brother   . Other Brother        committed suicide age 64  . Macular degeneration Brother   . Hyperlipidemia Mother   . Diabetes Maternal Grandmother   . Heart attack Paternal Grandfather     Social  History        Socioeconomic History  . Marital status: Married    Spouse name: Paula Johnson  . Number of children: 2  . Years of education: college  . 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 Financial resource strain: Not  on file  . Food insecurity:    Worry: Not on file    Inability: Not on file  . Transportation needs:    Medical: Not on file    Non-medical: Not on file  Tobacco Use  . Smoking status: Never Smoker  . Smokeless tobacco: Never Used  Substance and Sexual Activity  . Alcohol use: No    Alcohol/week: 0.0 standard drinks  . Drug use: No  . Sexual activity: Not Currently    Partners: Male    Birth control/protection: Surgical    Comment: TAH--ovaries remain  Lifestyle  . Physical activity:    Days per week: Not on file    Minutes per session: Not on file  . Stress: Not on file  Relationships  . Social connections:    Talks on phone: Not on file    Gets together: Not on file    Attends religious service: Not on file    Active member of club or organization: Not on file    Attends meetings of clubs or organizations: Not on file    Relationship status: Not on file  . Intimate partner violence:    Fear of current or ex partner: Not on file    Emotionally abused: Not on file    Physically abused: Not on file    Forced sexual activity: Not on file  Other Topics Concern  . Not on file  Social History Narrative   Lives with husband   Caffeine- coffee, 2 cups daily    Review of Systems  Constitutional: Negative.   HENT: Negative.   Eyes: Negative.   Respiratory: Negative.   Cardiovascular: Negative.   Gastrointestinal: Negative.   Endocrine: Negative.   Genitourinary: Negative.   Musculoskeletal: Negative.   Skin: Negative.   Allergic/Immunologic: Negative.   Neurological: Negative.   Hematological: Negative.     Psychiatric/Behavioral: Negative.   All other systems reviewed and are negative.   PHYSICAL EXAMINATION:    BP 100/78   Pulse 74   Resp 14   Ht _0  (1.727 m)   Wt 131 lb (59.4 kg)   LMP 09/26/1995 (Within Months)   BMI 19.92 kg/m     General appearance: alert, cooperative and appears stated age Head: Normocephalic, without obvious abnormality, atraumatic Neck: no adenopathy, supple, symmetrical, trachea midline and thyroid normal to inspection and palpation Lungs: clear to auscultation bilaterally Heart: regular rate and rhythm Abdomen: soft, non-tender, no masses,  no organomegaly Extremities: extremities normal, atraumatic, no cyanosis or edema Skin: Skin color, texture, turgor normal. No rashes or lesions No abnormal inguinal nodes palpated Neurologic: Grossly normal  Pelvic: External genitalia:  no lesions              Urethra:  normal appearing urethra with no masses, tenderness or lesions              Bartholins and Skenes: normal                 Vagina: normal appearing vagina with normal color and discharge.  Vault eversion noted.               Cervix:  Absent.  One tiny 2 mm red dot at vaginal apex.                Bimanual Exam:  Uterus:   Absent.              Adnexa: no mass, fullness, tenderness  Rectal exam: Yes.  .  Confirms.              Anus:  normal sphincter tone, no lesions  Chaperone was present for exam.  ASSESSMENT  Vaginal vault prolapse.  Stress incontinence.  Hx multiple myeloma.   PLAN  Proceed with robotic laparoscopic sacrocolpopexy, bilateral salpingo-oophorectomy, anterior and posterior colporrhaphy, TVT Exact midurethral sling, cystoscopy 05/14/2018.   Risks, benefits, and alternatives discussed with the patient who wishes to proceed.  Will do Mg Citrate prep 1.5 bottles.   An After Visit Summary was printed and given to the patient.

## 2018-05-14 ENCOUNTER — Encounter (HOSPITAL_COMMUNITY): Payer: Self-pay | Admitting: Emergency Medicine

## 2018-05-14 ENCOUNTER — Ambulatory Visit (HOSPITAL_BASED_OUTPATIENT_CLINIC_OR_DEPARTMENT_OTHER): Payer: 59 | Admitting: Anesthesiology

## 2018-05-14 ENCOUNTER — Observation Stay (HOSPITAL_COMMUNITY)
Admission: RE | Admit: 2018-05-14 | Discharge: 2018-05-15 | Disposition: A | Discharge status: Home / Self Care | Payer: 59 | Source: Ambulatory Visit | Attending: Obstetrics and Gynecology | Admitting: Obstetrics and Gynecology

## 2018-05-14 ENCOUNTER — Encounter (HOSPITAL_COMMUNITY)
Admission: RE | Disposition: A | Discharge status: Home / Self Care | Payer: Self-pay | Source: Ambulatory Visit | Attending: Obstetrics and Gynecology

## 2018-05-14 DIAGNOSIS — G4730 Sleep apnea, unspecified: Secondary | ICD-10-CM | POA: Diagnosis not present

## 2018-05-14 DIAGNOSIS — N9989 Other postprocedural complications and disorders of genitourinary system: Secondary | ICD-10-CM

## 2018-05-14 DIAGNOSIS — R339 Retention of urine, unspecified: Secondary | ICD-10-CM | POA: Diagnosis not present

## 2018-05-14 DIAGNOSIS — N816 Rectocele: Secondary | ICD-10-CM

## 2018-05-14 DIAGNOSIS — Z79890 Hormone replacement therapy (postmenopausal): Secondary | ICD-10-CM | POA: Diagnosis not present

## 2018-05-14 DIAGNOSIS — C9000 Multiple myeloma not having achieved remission: Secondary | ICD-10-CM | POA: Diagnosis not present

## 2018-05-14 DIAGNOSIS — R7303 Prediabetes: Secondary | ICD-10-CM | POA: Insufficient documentation

## 2018-05-14 DIAGNOSIS — N993 Prolapse of vaginal vault after hysterectomy: Principal | ICD-10-CM | POA: Insufficient documentation

## 2018-05-14 DIAGNOSIS — Z7982 Long term (current) use of aspirin: Secondary | ICD-10-CM | POA: Insufficient documentation

## 2018-05-14 DIAGNOSIS — Z9889 Other specified postprocedural states: Secondary | ICD-10-CM

## 2018-05-14 DIAGNOSIS — N393 Stress incontinence (female) (male): Secondary | ICD-10-CM | POA: Insufficient documentation

## 2018-05-14 DIAGNOSIS — Z8249 Family history of ischemic heart disease and other diseases of the circulatory system: Secondary | ICD-10-CM | POA: Insufficient documentation

## 2018-05-14 DIAGNOSIS — N8111 Cystocele, midline: Secondary | ICD-10-CM

## 2018-05-14 DIAGNOSIS — Z85238 Personal history of other malignant neoplasm of thymus: Secondary | ICD-10-CM | POA: Diagnosis not present

## 2018-05-14 DIAGNOSIS — N813 Complete uterovaginal prolapse: Secondary | ICD-10-CM | POA: Diagnosis not present

## 2018-05-14 DIAGNOSIS — Z90710 Acquired absence of both cervix and uterus: Secondary | ICD-10-CM | POA: Insufficient documentation

## 2018-05-14 DIAGNOSIS — Z880 Allergy status to penicillin: Secondary | ICD-10-CM | POA: Insufficient documentation

## 2018-05-14 HISTORY — PX: ANTERIOR AND POSTERIOR REPAIR: SHX5121

## 2018-05-14 HISTORY — DX: Other specified postprocedural states: Z98.890

## 2018-05-14 HISTORY — PX: BLADDER SUSPENSION: SHX72

## 2018-05-14 HISTORY — DX: Other postprocedural complications and disorders of genitourinary system: N99.89

## 2018-05-14 HISTORY — PX: CYSTOSCOPY: SHX5120

## 2018-05-14 HISTORY — PX: SALPINGOOPHORECTOMY: SHX82

## 2018-05-14 HISTORY — DX: Sleep apnea, unspecified: G47.30

## 2018-05-14 HISTORY — PX: ROBOTIC ASSISTED LAPAROSCOPIC SACROCOLPOPEXY: SHX5388

## 2018-05-14 LAB — DIFFERENTIAL
BASOS ABS: 0 10*3/uL (ref 0.0–0.1)
Basophils Relative: 0 %
Eosinophils Absolute: 0 10*3/uL (ref 0.0–0.7)
Eosinophils Relative: 0 %
LYMPHS PCT: 7 %
Lymphs Abs: 0.6 10*3/uL — ABNORMAL LOW (ref 0.7–4.0)
Monocytes Absolute: 0.1 10*3/uL (ref 0.1–1.0)
Monocytes Relative: 2 %
NEUTROS ABS: 7.4 10*3/uL (ref 1.7–7.7)
NEUTROS PCT: 91 %

## 2018-05-14 LAB — CBC
HCT: 36.1 % (ref 36.0–46.0)
Hemoglobin: 11.8 g/dL — ABNORMAL LOW (ref 12.0–15.0)
MCH: 30.6 pg (ref 26.0–34.0)
MCHC: 32.7 g/dL (ref 30.0–36.0)
MCV: 93.5 fL (ref 78.0–100.0)
PLATELETS: 259 10*3/uL (ref 150–400)
RBC: 3.86 MIL/uL — AB (ref 3.87–5.11)
RDW: 14.9 % (ref 11.5–15.5)
WBC: 8.1 10*3/uL (ref 4.0–10.5)

## 2018-05-14 LAB — ABO/RH: ABO/RH(D): O POS

## 2018-05-14 LAB — CREATININE, SERUM
Creatinine, Ser: 0.79 mg/dL (ref 0.44–1.00)
GFR calc Af Amer: >60 mL/min (ref >60)

## 2018-05-14 SURGERY — SACROCOLPOPEXY, ROBOT-ASSISTED, LAPAROSCOPIC
Anesthesia: General | Site: Vagina

## 2018-05-14 MED ORDER — ROCURONIUM BROMIDE 10 MG/ML (PF) SYRINGE
PREFILLED_SYRINGE | INTRAVENOUS | Status: DC | PRN
  Administered 2018-05-14: 50 mg via INTRAVENOUS
  Administered 2018-05-14: 15 mg via INTRAVENOUS
  Administered 2018-05-14 (×2): 20 mg via INTRAVENOUS
  Administered 2018-05-14: 15 mg via INTRAVENOUS

## 2018-05-14 MED ORDER — BUPIVACAINE HCL (PF) 0.25 % IJ SOLN
INTRAMUSCULAR | Status: DC | PRN
  Administered 2018-05-14: 7 mL

## 2018-05-14 MED ORDER — BUPIVACAINE HCL (PF) 0.25 % IJ SOLN
INTRAMUSCULAR | Status: AC
  Filled 2018-05-14: qty 30

## 2018-05-14 MED ORDER — FENTANYL CITRATE (PF) 250 MCG/5ML IJ SOLN
INTRAMUSCULAR | Status: AC
  Filled 2018-05-14: qty 5

## 2018-05-14 MED ORDER — ROCURONIUM BROMIDE 10 MG/ML (PF) SYRINGE
PREFILLED_SYRINGE | INTRAVENOUS | Status: AC
  Filled 2018-05-14: qty 10

## 2018-05-14 MED ORDER — KETOROLAC TROMETHAMINE 30 MG/ML IJ SOLN
INTRAMUSCULAR | Status: AC
  Filled 2018-05-14: qty 1

## 2018-05-14 MED ORDER — ONDANSETRON HCL 4 MG PO TABS
4.0000 mg | ORAL_TABLET | Freq: Four times a day (QID) | ORAL | Status: DC | PRN
  Filled 2018-05-14: qty 1

## 2018-05-14 MED ORDER — LIDOCAINE 2% (20 MG/ML) 5 ML SYRINGE
INTRAMUSCULAR | Status: DC | PRN
  Administered 2018-05-14: 70 mg via INTRAVENOUS

## 2018-05-14 MED ORDER — ONDANSETRON HCL 4 MG/2ML IJ SOLN
INTRAMUSCULAR | Status: AC
  Filled 2018-05-14: qty 2

## 2018-05-14 MED ORDER — ONDANSETRON HCL 4 MG/2ML IJ SOLN: 4.0000 mg | Freq: Once | INTRAMUSCULAR | Status: DC | PRN

## 2018-05-14 MED ORDER — SUGAMMADEX SODIUM 200 MG/2ML IV SOLN
INTRAVENOUS | Status: DC | PRN
  Administered 2018-05-14: 75 mg via INTRAVENOUS
  Administered 2018-05-14: 125 mg via INTRAVENOUS

## 2018-05-14 MED ORDER — FENTANYL CITRATE (PF) 100 MCG/2ML IJ SOLN
INTRAMUSCULAR | Status: DC | PRN
  Administered 2018-05-14 (×2): 50 ug via INTRAVENOUS
  Administered 2018-05-14: 25 ug via INTRAVENOUS
  Administered 2018-05-14 (×5): 50 ug via INTRAVENOUS

## 2018-05-14 MED ORDER — MIDAZOLAM HCL 2 MG/2ML IJ SOLN
INTRAMUSCULAR | Status: DC | PRN
  Administered 2018-05-14: 2 mg via INTRAVENOUS

## 2018-05-14 MED ORDER — SODIUM CHLORIDE 0.9 % IJ SOLN
INTRAMUSCULAR | Status: DC | PRN
  Administered 2018-05-14: 10 mL

## 2018-05-14 MED ORDER — MENTHOL 3 MG MT LOZG
LOZENGE | OROMUCOSAL | Status: AC
  Filled 2018-05-14: qty 9

## 2018-05-14 MED ORDER — KETOROLAC TROMETHAMINE 15 MG/ML IJ SOLN
15.0000 mg | Freq: Four times a day (QID) | INTRAMUSCULAR | Status: DC
  Administered 2018-05-14 – 2018-05-15 (×4): 15 mg via INTRAVENOUS

## 2018-05-14 MED ORDER — ENOXAPARIN SODIUM 40 MG/0.4ML ~~LOC~~ SOLN
40.0000 mg | SUBCUTANEOUS | Status: DC
  Filled 2018-05-14: qty 0.4

## 2018-05-14 MED ORDER — MEPERIDINE HCL 50 MG/ML IJ SOLN: 6.2500 mg | INTRAMUSCULAR | Status: DC | PRN

## 2018-05-14 MED ORDER — PROPOFOL 10 MG/ML IV BOLUS
INTRAVENOUS | Status: AC
  Filled 2018-05-14: qty 40

## 2018-05-14 MED ORDER — MENTHOL 3 MG MT LOZG
1.0000 | LOZENGE | OROMUCOSAL | Status: DC | PRN
  Filled 2018-05-14: qty 9

## 2018-05-14 MED ORDER — MIDAZOLAM HCL 2 MG/2ML IJ SOLN
INTRAMUSCULAR | Status: AC
  Filled 2018-05-14: qty 2

## 2018-05-14 MED ORDER — ALBUMIN HUMAN 5 % IV SOLN
INTRAVENOUS | Status: DC | PRN
  Administered 2018-05-14: 13:00:00 via INTRAVENOUS

## 2018-05-14 MED ORDER — PHENYLEPHRINE 40 MCG/ML (10ML) SYRINGE FOR IV PUSH (FOR BLOOD PRESSURE SUPPORT)
PREFILLED_SYRINGE | INTRAVENOUS | Status: AC
  Filled 2018-05-14: qty 10

## 2018-05-14 MED ORDER — PHENYLEPHRINE HCL 10 MG/ML IJ SOLN
INTRAMUSCULAR | Status: AC
  Filled 2018-05-14: qty 1

## 2018-05-14 MED ORDER — LIDOCAINE-EPINEPHRINE 1 %-1:100000 IJ SOLN
INTRAMUSCULAR | Status: DC | PRN
  Administered 2018-05-14: 3 mL
  Administered 2018-05-14: 6 mL

## 2018-05-14 MED ORDER — SODIUM CHLORIDE 0.9 % IJ SOLN
INTRAMUSCULAR | Status: AC
  Filled 2018-05-14: qty 10

## 2018-05-14 MED ORDER — SUGAMMADEX SODIUM 200 MG/2ML IV SOLN: INTRAVENOUS | Status: DC | PRN

## 2018-05-14 MED ORDER — LIDOCAINE-EPINEPHRINE 1 %-1:100000 IJ SOLN
INTRAMUSCULAR | Status: AC
  Filled 2018-05-14: qty 1

## 2018-05-14 MED ORDER — ESTRADIOL 0.1 MG/GM VA CREA
TOPICAL_CREAM | VAGINAL | Status: AC
  Filled 2018-05-14: qty 42.5

## 2018-05-14 MED ORDER — OXYCODONE-ACETAMINOPHEN 5-325 MG PO TABS: 2.0000 | ORAL_TABLET | ORAL | Status: DC | PRN

## 2018-05-14 MED ORDER — DEXAMETHASONE SODIUM PHOSPHATE 10 MG/ML IJ SOLN
INTRAMUSCULAR | Status: AC
  Filled 2018-05-14: qty 1

## 2018-05-14 MED ORDER — PHENYLEPHRINE HCL 10 MG/ML IJ SOLN
INTRAMUSCULAR | Status: DC | PRN
  Administered 2018-05-14: 80 ug via INTRAVENOUS
  Administered 2018-05-14: 160 ug via INTRAVENOUS
  Administered 2018-05-14 (×2): 80 ug via INTRAVENOUS

## 2018-05-14 MED ORDER — SCOPOLAMINE 1 MG/3DAYS TD PT72
MEDICATED_PATCH | TRANSDERMAL | Status: DC | PRN
  Administered 2018-05-14: 1 via TRANSDERMAL

## 2018-05-14 MED ORDER — CIPROFLOXACIN IN D5W 400 MG/200ML IV SOLN
400.0000 mg | INTRAVENOUS | Status: AC
  Administered 2018-05-14: 400 mg via INTRAVENOUS
  Filled 2018-05-14: qty 200

## 2018-05-14 MED ORDER — IBUPROFEN 200 MG PO TABS: 600.0000 mg | ORAL_TABLET | Freq: Four times a day (QID) | ORAL | Status: DC | PRN

## 2018-05-14 MED ORDER — ALBUMIN HUMAN 5 % IV SOLN
INTRAVENOUS | Status: AC
  Filled 2018-05-14: qty 250

## 2018-05-14 MED ORDER — ONDANSETRON HCL 4 MG/2ML IJ SOLN
INTRAMUSCULAR | Status: DC | PRN
  Administered 2018-05-14 (×2): 4 mg via INTRAVENOUS

## 2018-05-14 MED ORDER — LIDOCAINE 2% (20 MG/ML) 5 ML SYRINGE
INTRAMUSCULAR | Status: AC
  Filled 2018-05-14: qty 5

## 2018-05-14 MED ORDER — STERILE WATER FOR IRRIGATION IR SOLN
Status: DC | PRN
  Administered 2018-05-14: 300 mL

## 2018-05-14 MED ORDER — BACITRACIN ZINC 500 UNIT/GM EX OINT
TOPICAL_OINTMENT | CUTANEOUS | Status: AC
  Filled 2018-05-14: qty 28.35

## 2018-05-14 MED ORDER — LACTATED RINGERS IV SOLN
INTRAVENOUS | Status: DC
  Administered 2018-05-14 – 2018-05-15 (×2): via INTRAVENOUS

## 2018-05-14 MED ORDER — SODIUM CHLORIDE 0.9 % IR SOLN
Status: DC | PRN
  Administered 2018-05-14: 3000 mL

## 2018-05-14 MED ORDER — MORPHINE SULFATE (PF) 4 MG/ML IV SOLN: 2.0000 mg | INTRAVENOUS | Status: DC | PRN

## 2018-05-14 MED ORDER — SUGAMMADEX SODIUM 200 MG/2ML IV SOLN
INTRAVENOUS | Status: AC
  Filled 2018-05-14: qty 2

## 2018-05-14 MED ORDER — ESTRADIOL 0.1 MG/GM VA CREA
TOPICAL_CREAM | VAGINAL | Status: DC | PRN
  Administered 2018-05-14: 1 via VAGINAL

## 2018-05-14 MED ORDER — LACTATED RINGERS IV SOLN
INTRAVENOUS | Status: DC
  Administered 2018-05-14 (×3): via INTRAVENOUS

## 2018-05-14 MED ORDER — ARTIFICIAL TEARS OPHTHALMIC OINT
TOPICAL_OINTMENT | OPHTHALMIC | Status: DC | PRN
  Administered 2018-05-14: 1 via OPHTHALMIC

## 2018-05-14 MED ORDER — PROPOFOL 10 MG/ML IV BOLUS
INTRAVENOUS | Status: AC
  Filled 2018-05-14: qty 20

## 2018-05-14 MED ORDER — HYDROMORPHONE HCL 1 MG/ML IJ SOLN: 0.2500 mg | INTRAMUSCULAR | Status: DC | PRN

## 2018-05-14 MED ORDER — ONDANSETRON HCL 4 MG/2ML IJ SOLN: 4.0000 mg | Freq: Four times a day (QID) | INTRAMUSCULAR | Status: DC | PRN

## 2018-05-14 MED ORDER — ENOXAPARIN SODIUM 40 MG/0.4ML ~~LOC~~ SOLN
40.0000 mg | SUBCUTANEOUS | Status: AC
  Administered 2018-05-14: 40 mg via SUBCUTANEOUS
  Filled 2018-05-14: qty 0.4

## 2018-05-14 MED ORDER — PROPOFOL 10 MG/ML IV BOLUS
INTRAVENOUS | Status: DC | PRN
  Administered 2018-05-14: 20 mg via INTRAVENOUS
  Administered 2018-05-14: 130 mg via INTRAVENOUS

## 2018-05-14 MED ORDER — METRONIDAZOLE IN NACL 5-0.79 MG/ML-% IV SOLN
500.0000 mg | INTRAVENOUS | Status: AC
  Administered 2018-05-14: 500 mg via INTRAVENOUS
  Filled 2018-05-14: qty 100

## 2018-05-14 MED ORDER — SODIUM CHLORIDE 0.9 % IV SOLN
INTRAVENOUS | Status: DC | PRN
  Administered 2018-05-14: 100 ug/min via INTRAVENOUS

## 2018-05-14 MED ORDER — KETOROLAC TROMETHAMINE 30 MG/ML IJ SOLN
INTRAMUSCULAR | Status: DC | PRN
  Administered 2018-05-14: 30 mg via INTRAVENOUS

## 2018-05-14 MED ORDER — EPHEDRINE SULFATE-NACL 50-0.9 MG/10ML-% IV SOSY
PREFILLED_SYRINGE | INTRAVENOUS | Status: DC | PRN
  Administered 2018-05-14 (×3): 10 mg via INTRAVENOUS

## 2018-05-14 MED ORDER — DEXAMETHASONE SODIUM PHOSPHATE 10 MG/ML IJ SOLN
INTRAMUSCULAR | Status: DC | PRN
  Administered 2018-05-14: 10 mg via INTRAVENOUS

## 2018-05-14 SURGICAL SUPPLY — 87 items
BARRIER ADHS 3X4 INTERCEED (GAUZE/BANDAGES/DRESSINGS) IMPLANT
BLADE SURG 15 STRL LF C SS BP (BLADE) ×5 IMPLANT
BLADE SURG 15 STRL SS (BLADE) ×1
BLADE SURG SZ11 CARB STEEL (BLADE) ×6 IMPLANT
CANISTER SUCT 3000ML PPV (MISCELLANEOUS) ×6 IMPLANT
CATH FOLEY 2WAY SLVR  5CC 14FR (CATHETERS) ×1
CATH FOLEY 2WAY SLVR  5CC 18FR (CATHETERS) ×1
CATH FOLEY 2WAY SLVR 5CC 14FR (CATHETERS) ×5 IMPLANT
CATH FOLEY 2WAY SLVR 5CC 18FR (CATHETERS) ×5 IMPLANT
CATH FOLEY 3WAY  5CC 16FR (CATHETERS) ×1
CATH FOLEY 3WAY 5CC 16FR (CATHETERS) ×5 IMPLANT
CONT PATH 16OZ SNAP LID 3702 (MISCELLANEOUS) IMPLANT
COUNTER NEEDLE 20 DBL MAG RED (NEEDLE) ×6 IMPLANT
COVER TIP SHEARS 8 DVNC (MISCELLANEOUS) ×5 IMPLANT
COVER TIP SHEARS 8MM DA VINCI (MISCELLANEOUS) ×1
DECANTER SPIKE VIAL GLASS SM (MISCELLANEOUS) IMPLANT
DEFOGGER SCOPE WARMER CLEARIFY (MISCELLANEOUS) ×6 IMPLANT
DERMABOND ADVANCED (GAUZE/BANDAGES/DRESSINGS) ×1
DERMABOND ADVANCED .7 DNX12 (GAUZE/BANDAGES/DRESSINGS) ×5 IMPLANT
DEVICE CAPIO SLIM SINGLE (INSTRUMENTS) IMPLANT
DRAPE ARM DVNC X/XI (DISPOSABLE) ×20 IMPLANT
DRAPE COLUMN DVNC XI (DISPOSABLE) ×5 IMPLANT
DRAPE DA VINCI XI ARM (DISPOSABLE) ×4
DRAPE DA VINCI XI COLUMN (DISPOSABLE) ×1
DURAPREP 26ML APPLICATOR (WOUND CARE) ×6 IMPLANT
ELECT REM PT RETURN 15FT ADLT (MISCELLANEOUS) ×6 IMPLANT
GAUZE PACKING 2X5 YD STRL (GAUZE/BANDAGES/DRESSINGS) ×12 IMPLANT
GLOVE BIO SURGEON STRL SZ 6.5 (GLOVE) ×18 IMPLANT
GLOVE BIOGEL PI IND STRL 6.5 (GLOVE) ×5 IMPLANT
GLOVE BIOGEL PI IND STRL 7.0 (GLOVE) ×10 IMPLANT
GLOVE BIOGEL PI INDICATOR 6.5 (GLOVE) ×1
GLOVE BIOGEL PI INDICATOR 7.0 (GLOVE) ×2
GOWN STRL REUS W/TWL LRG LVL3 (GOWN DISPOSABLE) ×24 IMPLANT
IRRIG SUCT STRYKERFLOW 2 WTIP (MISCELLANEOUS) ×12
IRRIGATION SUCT STRKRFLW 2 WTP (MISCELLANEOUS) ×10 IMPLANT
MESH ARTISYN Y (Sling) ×6 IMPLANT
NEEDLE HYPO 22GX1.5 SAFETY (NEEDLE) ×12 IMPLANT
NEEDLE INSUFFLATION 120MM (ENDOMECHANICALS) ×6 IMPLANT
NEEDLE MAYO 6 CRC TAPER PT (NEEDLE) IMPLANT
NS IRRIG 1000ML POUR BTL (IV SOLUTION) IMPLANT
OBTURATOR OPTICAL STANDARD 8MM (TROCAR) ×1
OBTURATOR OPTICAL STND 8 DVNC (TROCAR) ×5
OBTURATOR OPTICALSTD 8 DVNC (TROCAR) ×5 IMPLANT
OCCLUDER COLPOPNEUMO (BALLOONS) IMPLANT
PACK ROBOT WH (CUSTOM PROCEDURE TRAY) ×6 IMPLANT
PACK ROBOTIC GOWN (GOWN DISPOSABLE) ×6 IMPLANT
PACK TRENDGUARD 450 HYBRID PRO (MISCELLANEOUS) ×5 IMPLANT
PACK VAGINAL WOMENS (CUSTOM PROCEDURE TRAY) ×6 IMPLANT
PAD PREP 24X48 CUFFED NSTRL (MISCELLANEOUS) ×6 IMPLANT
POSITIONER SURGICAL ARM (MISCELLANEOUS) ×12 IMPLANT
SCISSORS LAP 5X35 DISP (ENDOMECHANICALS) ×6 IMPLANT
SEAL CANN UNIV 5-8 DVNC XI (MISCELLANEOUS) ×15 IMPLANT
SEAL XI 5MM-8MM UNIVERSAL (MISCELLANEOUS) ×3
SET CYSTO W/LG BORE CLAMP LF (SET/KITS/TRAYS/PACK) ×6 IMPLANT
SET TRI-LUMEN FLTR TB AIRSEAL (TUBING) ×6 IMPLANT
SLING TVT EXACT (Sling) ×6 IMPLANT
SPONGE SURGIFOAM ABS GEL 12-7 (HEMOSTASIS) IMPLANT
SURGIFLO W/THROMBIN 8M KIT (HEMOSTASIS) IMPLANT
SUT CAPIO ETHIBPND (SUTURE) IMPLANT
SUT ETHIBOND 0 (SUTURE) IMPLANT
SUT GORETEX NAB #0 THX26 36IN (SUTURE) ×30 IMPLANT
SUT VIC AB 0 CT1 18XCR BRD8 (SUTURE) ×5 IMPLANT
SUT VIC AB 0 CT1 27 (SUTURE) ×3
SUT VIC AB 0 CT1 27XBRD ANBCTR (SUTURE) ×15 IMPLANT
SUT VIC AB 0 CT1 8-18 (SUTURE) ×1
SUT VIC AB 2-0 CT1 27 (SUTURE)
SUT VIC AB 2-0 CT1 TAPERPNT 27 (SUTURE) IMPLANT
SUT VIC AB 2-0 CT2 27 (SUTURE) IMPLANT
SUT VIC AB 2-0 SH 27 (SUTURE) ×8
SUT VIC AB 2-0 SH 27XBRD (SUTURE) ×40 IMPLANT
SUT VIC AB 2-0 UR6 27 (SUTURE) IMPLANT
SUT VIC AB 4-0 PS2 18 (SUTURE) ×18 IMPLANT
SUT VIC AB 4-0 PS2 27 (SUTURE) ×12 IMPLANT
SUT VICRYL 0 UR6 27IN ABS (SUTURE) ×12 IMPLANT
SUT VLOC 180 2-0 6IN GS21 (SUTURE) IMPLANT
SUT VLOC 180 2-0 9IN GS21 (SUTURE) IMPLANT
SUT VLOC NONABS 0 BL6 GS-21 (SUTURE) IMPLANT
TIP UTERINE 5.1X6CM LAV DISP (MISCELLANEOUS) IMPLANT
TIP UTERINE 6.7X10CM GRN DISP (MISCELLANEOUS) IMPLANT
TIP UTERINE 6.7X6CM WHT DISP (MISCELLANEOUS) IMPLANT
TIP UTERINE 6.7X8CM BLUE DISP (MISCELLANEOUS) IMPLANT
TOWEL OR 17X26 10 PK STRL BLUE (TOWEL DISPOSABLE) ×12 IMPLANT
TRAY FOLEY MTR SLVR 14FR STAT (SET/KITS/TRAYS/PACK) ×6 IMPLANT
TRENDGUARD 450 HYBRID PRO PACK (MISCELLANEOUS) ×6
TROCAR BLADELESS OPT 5 100 (ENDOMECHANICALS) ×6 IMPLANT
TROCAR PORT AIRSEAL 8X120 (TROCAR) ×6 IMPLANT
WARMER LAPAROSCOPE (MISCELLANEOUS) ×6 IMPLANT

## 2018-05-14 NOTE — Transfer of Care (Signed)
  Last Vitals:  Vitals Value Taken Time  BP 117/76 05/14/2018  2:36 PM  Temp    Pulse 84 05/14/2018  2:42 PM  Resp 13 05/14/2018  2:42 PM  SpO2 100 % 05/14/2018  2:42 PM  Vitals shown include unvalidated device data.  Last Pain:  Vitals:   05/14/18 0625  TempSrc: Oral  PainSc:       Patients Stated Pain Goal: 4 (05/14/18 0601)  Immediate Anesthesia Transfer of Care Note  Patient: Paula Johnson  Procedure(s) Performed: Procedure(s) (LRB): XI ROBOTIC ASSISTED LAPAROSCOPIC SACROCOLPOPEXY (N/A) ANTERIOR (CYSTOCELE) AND POSTERIOR REPAIR (RECTOCELE) (N/A) TRANSVAGINAL TAPE (TVT) PROCEDURE exact midurethral sling (N/A) CYSTOSCOPY (N/A) SALPINGO OOPHORECTOMY (Bilateral)  Patient Location: PACU  Anesthesia Type: General  Level of Consciousness: awake, alert  and oriented  Airway & Oxygen Therapy: Patient Spontanous Breathing and Patient connected to face mask oxygen  Post-op Assessment: Report given to PACU RN and Post -op Vital signs reviewed and stable  Post vital signs: Reviewed and stable  Complications: No apparent anesthesia complications

## 2018-05-14 NOTE — OR Nursing (Signed)
Artisyn Y shaped mesh partially absorbable implanted in pelvis for sacroculpopexy at 10:30am. Lot [id number] Exp 03/25/2019

## 2018-05-14 NOTE — Anesthesia Procedure Notes (Signed)
Procedure Name: Intubation Date/Time: 05/14/2018 7:41 AM Performed by: Lillia Abed, MD Pre-anesthesia Checklist: Patient identified, Emergency Drugs available, Suction available and Patient being monitored Patient Re-evaluated:Patient Re-evaluated prior to induction Oxygen Delivery Method: Circle system utilized Preoxygenation: Pre-oxygenation with 100% oxygen Induction Type: IV induction Ventilation: Mask ventilation without difficulty Laryngoscope Size: Mac and 3 Grade View: Grade I Tube type: Oral Tube size: 7.0 mm Number of attempts: 1 Airway Equipment and Method: Stylet and Oral airway Placement Confirmation: ETT inserted through vocal cords under direct vision,  positive ETCO2 and breath sounds checked- equal and bilateral Secured at: 21 cm Tube secured with: Tape Dental Injury: Teeth and Oropharynx as per pre-operative assessment

## 2018-05-14 NOTE — Progress Notes (Signed)
Crepitus noted around left lateral port and bilateral clavicles. Respirations easy and unlabored. Mild periorbital edema noted. Head of bed elevated.

## 2018-05-14 NOTE — Anesthesia Preprocedure Evaluation (Signed)
Anesthesia Evaluation  Patient identified by MRN, date of birth, ID band Patient awake    Reviewed: Allergy & Precautions, NPO status , Patient's Chart, lab work & pertinent test results  History of Anesthesia Complications (+) PONV  Airway Mallampati: I  TM Distance: >3 FB Neck ROM: Full    Dental   Pulmonary sleep apnea and Continuous Positive Airway Pressure Ventilation ,    Pulmonary exam normal        Cardiovascular Normal cardiovascular exam     Neuro/Psych    GI/Hepatic GERD  Medicated and Controlled,  Endo/Other    Renal/GU      Musculoskeletal   Abdominal   Peds  Hematology Multiple Myeloma S/P BM Transplant   Anesthesia Other Findings   Reproductive/Obstetrics                             Anesthesia Physical Anesthesia Plan  ASA: III  Anesthesia Plan: General   Post-op Pain Management:    Induction: Intravenous  PONV Risk Score and Plan: 4 or greater and Scopolamine patch - Pre-op, Dexamethasone, Ondansetron and Midazolam  Airway Management Planned: Oral ETT  Additional Equipment:   Intra-op Plan:   Post-operative Plan: Extubation in OR  Informed Consent: I have reviewed the patients History and Physical, chart, labs and discussed the procedure including the risks, benefits and alternatives for the proposed anesthesia with the patient or authorized representative who has indicated his/her understanding and acceptance.     Plan Discussed with: CRNA and Surgeon  Anesthesia Plan Comments:         Anesthesia Quick Evaluation

## 2018-05-14 NOTE — Progress Notes (Signed)
Dr. Quincy Simmonds notified that pt was concerned about absolute neutrophil count. Dr. Quincy Simmonds verified that they would draw this lab after surgery.

## 2018-05-14 NOTE — Op Note (Signed)
OPERATIVE REPORT   PREOPERATIVE DIAGNOSIS:   Complete uterovaginal prolapase, cystocele, rectocele, genuine stress incontinence.   POSTOPERATIVE DIAGNOSIS:   Complete uterovaginal prolapase, cystocele, rectocele, genuine stress incontinence.   PROCEDURE:  Robotically assisted bilateral salpingo-oophorectomy, sacral colpopexy, anterior and posterior colporrhaphy, TVT Exact midurethral sling and cystoscopy   SURGEON:  Lenard Galloway, MD   ASSISTANT:  Dorothy Spark, MD   ANESTHESIA:  General endotracheal, local .25% marcaine, local 1% lidocaine with epinephrine 1:100,000.   IV FLUIDS:  3000 cc LR, 5% albumin.   EBL:  100 cc.    URINE OUTPUT:  200 cc.   COMPLICATIONS:  None.   INDICATIONS FOR THE PROCEDURE:      The patient is a 62 year old Caucasian female, status post total abdominal hysterectomy who presents with complete vaginal vault eversion and intolerance of pessary use.  Urodynamic testing confirms the presence of genuine stress incontinence.  She requests surgical treatment for her prolapse and stress incontinence.  The plan with be to proceed with a robotically assisted bilateral salpingo-oophorectomy, sacral colpopexy, anterior and posterior colporrhaphy, TVT Exact midurethral sling and cystoscopy.  Risks, benefits, and alternatives have been reviewed with the patient who wishes to proceed.    FINDINGS:  Examination under anesthesia revealed vaginal vault eversion.  No adnexal masses were appreciated.    Laparoscopy revealed an absent uterus. No pelvic adhesions were noted. The tubes and ovaries were normal. In the upper abdomen, the liver, gall bladder, appendix, and stomach appeared to be normal.    Cystoscopy during the midurethral sling procedure documented the bladder to be intact throughout 360 degrees including the bladder dome and trigone.  There was no evidence of a foreign body in the bladder or the urethra.  The ureters were patent bilaterally.   SPECIMENS:  The  bilateral fallopian tubes and ovaries were sent to pathology.   PROCEDURE IN DETAIL:  The patient was reidentified in the preop hold area.  She did receive Ciprofloxacin and Flagyl IV for antibiotic prophylaxis.  She received Lovenox, TED hose, and PAS stockings for DVT prophylaxis.   The patient was escorted to the operating room, where she was placed in the dorsal lithotomy position in [place].  General endotracheal anesthesia was induced.  The patient's lower abdomen, vagina, and perineum were sterilely prepped and she was sterilely draped.  The Foley catheter was placed inside the bladder and left to gravity drainage throughout and at the termination of the procedure.   In the operating room, the patient was placed in the dorsal lithotomy position on the operating room table.  Her legs were placed in the [place] stirrups and her arms were both tucked at her sides with the appropriate padded arm protectors.  The patient received general endotracheal anesthesia and she then received proper padding around her head and neck.  The Trendguard was used to support her shoulders.   The abdomen and vagina were then sterilely prepped and she was sterilely draped.   Attention was turned to the abdomen where the umbilical region was injected with 0.25% Marcaine and a small incision created.  A Veress needle was then used to insufflate the abdomen with CO2 gas after a saline drop test was performed and the fluid flowed freely.  An 8 mm supraumbilical incision was created with a scalpel after the skin was injected locally with 0.25% Marcaine.  An 8 mm robotic camera port was then placed through this umbilical incision.  Incisions were then created 8 cm to  the right and left of the supraumbilical incision and on the right and left sides after the skin was injected locally with 0.25% Marcaine.  8 mm robotic trocars were then placed under visualization of the laparoscope.   An 8 mm Airseal trocar was placed in the  right lower quadrant after injecting the skin with Marcaine and creating the incision with the scalpel.   Eventually, a 5 mm trocar was also placed in the upper abdomen.     The patient was placed in Trendelenburg position.  An inspection of the abdomen and pelvis was performed. The findings are as noted above.    The robot was docked on the patient's right-hand side.  The monopolar laparoscopic scissors was placed in robotic port #1 and the bipolar forceps was placed through robotic port #2.  The prograsp was place in robotic port #3.   I stepped to the surgeon's console while my assistant and the rest of the surgical team remained at the patient's side.   The bilateral ureters were identified.  The left infundibulopelvic ligament was cauterized with bipolar energy and the cut.  Dissection continued through the broad ligament on the same side using the same instrumentation.  The fallopian tube and ovary were excised and sent to pathology after the remaining right tube and ovary were removed in a similar manner.   The robotic sacrocolpopexy was performed.  An EEA Sizer was placed in the vagina.   There were congenital adhesions of the mesocolon which held the appendix toward the pelvic brim.  These were lysed with monopolar cautery.   The prograsp instrument was used to sweep the large intestine toward the left abdominal wall, and attention at this time was turned to the sacral promontory.  The dissection of the anterior and posterior peritoneum over the anterior and posterior vagina was performed with the monopolar cautery scissors and with the bipolar energy.  There was a small 3 mm vaginotomy of the posterior vaginal cuff, and this was later closed with 2 simple sutures of 2/0 Vicryl.   The peritoneum overlying the sacral promontory was identified along with the landmarks of the bifurcation of the aorta and iliac vessels and the right ureter.  The peritoneum was tented and opened using the robotic  monopolar cautery scissors.  Careful dissection was performed down to the level of the sacral promontory.  The anterior longitudinal ligament was identified along with the middle sacral vessels, which were respected.  Two sutures of 2/0 Gortex were brought through the anterior longitudinal ligament.   The peritoneum was then opened inferiorly along the patient's right pelvic sidewall using the monopolar scissors.  This continued down to the vaginal apex.    The Artisyn Y-shaped mesh was trimmed to properly fit the patient's anatomy and the mesh was secured to the anterior vagina using a series of four 2/0 Gortex sutures.  The same was performed along the posterior vaginal wall using two 2/0 Gortex sutures.    The EEA Sizer was removed and a hand was placed inside the vagina and the elevation of the vaginal cuff was simulated.    These sutures were then brought through the mesh at the level of the sacral promontory, and the sutures were tied.  Excess mesh was trimmed at the level of the sacral attachment.   A running suture of 2/0 Vicryl was used to close the peritoneum over the mesh from the vaginal cuff to the sacral promontory.  The mesh was 100% covered by peritoneum.  The pelvis was irrigated and suctioned.  Hemostasis was good. The insufflation pressure was reduced, and there was no evidence of bleeding at the operative sites.   The trocars were removed and the pneumoperitoneum was released. The 8 AirSeal fascia was closed with a figure of eight suture of 0/0 Vicryl suture.    Attention was turned to the vaginal portion of the procedure at this point.  The patient was examined and there was excellent vaginal cuff support.  There was still evidence of first degree cystocele and a mild rectocele.   Allis clamps were used to mark the anterior vaginal wall beginning 1 cm below the urethral meatus and extending down toward the vaginal cuff. The mucosa was injected locally with 1% lidocaine with  epinephrine 1:100,000.  The mucosa was incised vertically in the midline with a scalpel and a combination of sharp and blunt dissection were used to dissect this vaginal tissue and mucosa off the underlying bladder.  The dissection was carried back to the pubic rami.   A 1 cm suprapubic incisions were then created 2 cm to the right and left in the midline.  The Foley catheter was removed.  The TVT Exact was performed in a bottom up fashion.  The urethral guide and Foley tip were placed in the urethra and the urethra was deflected properly as the TVT guide was brought through the right retropubic space and up through the right suprapubic incision.  The urethra was then deflected in the opposite direction and the TVT guide was placed through the left retropubic space and through the left suprapubic ipsilateral incision.   The obturator guide was removed at this time and cystoscopy was performed and the findings were as noted above.   The bladder was drained of all cystoscopic fluid and the Foley catheter was replaced.  The sling was brought up through the suprapubic incisions bilaterally.  The plastic sheaths were removed as a Kelly clamp was placed between the urethra and the sling.  The sling was noted to be in excellent position.  Excess mesh was trimmed suprapubically.  The anterior colporrhaphy was performed with vertical mattress sutures of 0/0 Vicryl.  Gortex suture was encountered in the subvaginal tissue of the apex on the patient's right side.  This was not full thickness in the vagina.  Repeat cystoscopy was performed and there was no evidence of Gortex in the bladder. The vaginal skin edges were trimmed at this time and the anterior vaginal wall was closed with a running lock suture of 2-0 Vicryl.   The suprapubic incisions were closed with Dermabond.    Attention was turned to the posterior vaginal wall.  Allis clamps were used to mark the introitus, the perineal body, and then the posterior  vaginal wall mucosa.  The perineal body and the posterior vaginal mucosa were injected with 1% lidocaine with epinephrine 1:100,000.  A diamond shaped wedge of epithelium was excised from the perineal body and the posterior vaginal mucosa was incised vertically in the midline with the Metzenbaum scissors.  Sharp and blunt dissection were used to dissect the perirectal fascia off the vaginal mucosa bilaterally.  The posterior colporrhaphy was then performed with vertical mattress sutures of 0 Vicryl which reduced the rectocele.  Rectal exam confirmed the absence of sutures in the rectum. Excess vaginal mucosa was trimmed and the posterior vaginal mucosa was closed with a running lock suture of 2-0 Vicryl.  This was brought down to the hymenal ring and then under the  hymen.  The transverse perineal muscles were reapproximated with a running suture of this 2-0 Vicryl. The suture was then brought up the perineal body in a subcuticular fashion as for an episiotomy repair.  The knot was tied at the hymen.   The vaginal cuff was examined, and there was evidence of a small amount of bleeding on the posterior apex where vaginotomy occurred during the vaginal cuff dissection.  This became hemostatic with 2 figure of eight sutures of 2/0 Vicryl.  Vaginal examination confirmed excellent vaginal support and elevation.  A gauze packing with Estrace cream was placed in the vagina.   The patient was awakened and extubated, and escorted to the recovery room in stable condition.  There were no complications. All needle, instrument, and sponge counts were correct.     Lenard Galloway, M.D.

## 2018-05-14 NOTE — Progress Notes (Signed)
Update to History and Physical  No marked change in status since office preop visit.  She did have mild neutropenia with count of 1400.  This was repeated on 05/09/18 and it went up to 1900.  She is off her Revlimid.  Patient examined.  OK to proceed with surgery.

## 2018-05-14 NOTE — Progress Notes (Signed)
Day of Surgery Procedure(s) (LRB): XI ROBOTIC ASSISTED LAPAROSCOPIC SACROCOLPOPEXY (N/A) ANTERIOR (CYSTOCELE) AND POSTERIOR REPAIR (RECTOCELE) (N/A) TRANSVAGINAL TAPE (TVT) PROCEDURE exact midurethral sling (N/A) CYSTOSCOPY (N/A) SALPINGO OOPHORECTOMY (Bilateral)  Subjective: Patient reports no pain or nausea.  Objective: I have reviewed patient's vital signs and intake and output. Vitals:   05/14/18 1555 05/14/18 1630  BP: 118/74 112/70  Pulse: 77 80  Resp: 12 12  Temp: (!) 97 F (36.1 C)   SpO2: 100% 98%   UO - 450 cc since surgery completed.   General: sleepy but appropriate with responses.  Resp: clear to auscultation bilaterally Cardio: regular rate and rhythm, S1, S2 normal, no murmur, click, rub or gallop GI: soft, non-tender; bowel sounds normal; no masses,  no organomegaly and incision: clean, dry and intact Extremities: PAS and Ted hose on.  DPs 2+ bilaterally.  Vaginal Bleeding: none  Assessment: s/p Procedure(s) with comments: XI ROBOTIC ASSISTED LAPAROSCOPIC SACROCOLPOPEXY (N/A) - 6 hours OR time. Need extended stay recovery bed. ANTERIOR (CYSTOCELE) AND POSTERIOR REPAIR (RECTOCELE) (N/A) TRANSVAGINAL TAPE (TVT) PROCEDURE exact midurethral sling (N/A) CYSTOSCOPY (N/A) SALPINGO OOPHORECTOMY (Bilateral): stable  Plan: Toradol and Percocet for pain.  Morphine as needed for breakthrough pain.  Continue IVF.  Foley and vaginal packing out in the am.  Clear liquids, advance as tolerated. CBC and Cr now.  CBC and BMP in the am.  Lovenox in am.  Surgical findings and procedure discussed.     LOS: 0 days    Arloa Koh 05/14/2018, 5:07 PM

## 2018-05-15 DIAGNOSIS — N993 Prolapse of vaginal vault after hysterectomy: Secondary | ICD-10-CM | POA: Diagnosis not present

## 2018-05-15 LAB — CBC
HEMATOCRIT: 30.3 % — AB (ref 36.0–46.0)
HEMATOCRIT: 31.1 % — AB (ref 36.0–46.0)
Hemoglobin: 10.1 g/dL — ABNORMAL LOW (ref 12.0–15.0)
Hemoglobin: 10.3 g/dL — ABNORMAL LOW (ref 12.0–15.0)
MCH: 30.9 pg (ref 26.0–34.0)
MCH: 30.9 pg (ref 26.0–34.0)
MCHC: 33.3 g/dL (ref 30.0–36.0)
MCHC: 33.1 g/dL (ref 30.0–36.0)
MCV: 92.7 fL (ref 78.0–100.0)
MCV: 93.4 fL (ref 78.0–100.0)
PLATELETS: 243 10*3/uL (ref 150–400)
Platelets: 218 10*3/uL (ref 150–400)
RBC: 3.27 MIL/uL — ABNORMAL LOW (ref 3.87–5.11)
RBC: 3.33 MIL/uL — AB (ref 3.87–5.11)
RDW: 15 % (ref 11.5–15.5)
RDW: 15.3 % (ref 11.5–15.5)
WBC: 5.7 10*3/uL (ref 4.0–10.5)
WBC: 5 10*3/uL (ref 4.0–10.5)

## 2018-05-15 LAB — BASIC METABOLIC PANEL
Anion gap: 5 (ref 5–15)
BUN: 12 mg/dL (ref 8–23)
CO2: 26 mmol/L (ref 22–32)
CREATININE: 0.75 mg/dL (ref 0.44–1.00)
Calcium: 7.6 mg/dL — ABNORMAL LOW (ref 8.9–10.3)
Chloride: 109 mmol/L (ref 98–111)
GFR calc non Af Amer: >60 mL/min (ref >60)
Glucose, Bld: 96 mg/dL (ref 70–99)
POTASSIUM: 4.3 mmol/L (ref 3.5–5.1)
SODIUM: 140 mmol/L (ref 135–145)

## 2018-05-15 LAB — TYPE AND SCREEN
ABO/RH(D): O POS
ANTIBODY SCREEN: NEGATIVE

## 2018-05-15 MED ORDER — WHITE PETROLATUM EX OINT
TOPICAL_OINTMENT | CUTANEOUS | Status: AC
  Filled 2018-05-15: qty 5

## 2018-05-15 MED ORDER — OXYCODONE-ACETAMINOPHEN 5-325 MG PO TABS: 2.0000 | ORAL_TABLET | ORAL | 0 refills | Status: DC | PRN

## 2018-05-15 MED ORDER — KETOROLAC TROMETHAMINE 30 MG/ML IJ SOLN
INTRAMUSCULAR | Status: AC
  Filled 2018-05-15: qty 1

## 2018-05-15 MED ORDER — IBUPROFEN 600 MG PO TABS: 600.0000 mg | ORAL_TABLET | Freq: Four times a day (QID) | ORAL | 0 refills | Status: DC | PRN

## 2018-05-15 MED ORDER — CIPROFLOXACIN HCL 250 MG PO TABS: 250.0000 mg | ORAL_TABLET | Freq: Two times a day (BID) | ORAL | 0 refills | Status: DC

## 2018-05-15 NOTE — Progress Notes (Signed)
Pt instructed on foley care and converting drainage bag to leg bag during the day and back to drng bag at night.  Pt voices understanding.  Discharge inst given to pt w/ voiced understanding.  Husband to take pt home

## 2018-05-15 NOTE — Progress Notes (Addendum)
Pt OOB in hall.  Attempted to void but unable.  Bladder scan done w/ only 92cc indicated.  Foley inserted and 450cc clear yellow urine returned.  Dr. Quincy Simmonds notified.

## 2018-05-15 NOTE — Progress Notes (Signed)
1 Day Post-Op Procedure(s) (LRB): XI ROBOTIC ASSISTED LAPAROSCOPIC SACROCOLPOPEXY (N/A) ANTERIOR (CYSTOCELE) AND POSTERIOR REPAIR (RECTOCELE) (N/A) TRANSVAGINAL TAPE (TVT) PROCEDURE exact midurethral sling (N/A) CYSTOSCOPY (N/A) SALPINGO OOPHORECTOMY (Bilateral)  Subjective: Patient reports a little dizziness with ambulation.  Tolerating regular diet.  No void yet.  Nurse at bedside reporting PVR 600 cc so had I and O cath.  This was her first trial of voiding.   Objective: I have reviewed patient's vital signs, intake and output and labs. Vitals:   05/15/18 0610 05/15/18 1015  BP: 109/72 121/69  Pulse: 90 99  Resp: 16 16  Temp: 98.5 F (36.9 C) 98.4 F (36.9 C)  SpO2: 99% 99%   UO yesterday 2500 cc.  UO today 635 cc.   CBC    Component Value Date/Time   WBC 5.0 05/15/2018 1139   RBC 3.33 (L) 05/15/2018 1139   HGB 10.3 (L) 05/15/2018 1139   HGB 12.1 05/03/2018 1001   HGB 13.0 09/14/2017 1035   HCT 31.1 (L) 05/15/2018 1139   HCT 38.7 09/14/2017 1035   PLT 243 05/15/2018 1139   PLT 227 05/03/2018 1001   PLT 266 09/14/2017 1035   MCV 93.4 05/15/2018 1139   MCV 95.1 09/14/2017 1035   MCH 30.9 05/15/2018 1139   MCHC 33.1 05/15/2018 1139   RDW 15.3 05/15/2018 1139   RDW 14.4 09/14/2017 1035   LYMPHSABS 0.6 (L) 05/14/2018 1658   LYMPHSABS 0.9 09/14/2017 1035   MONOABS 0.1 05/14/2018 1658   MONOABS 0.4 09/14/2017 1035   EOSABS 0.0 05/14/2018 1658   EOSABS 0.0 09/14/2017 1035   BASOSABS 0.0 05/14/2018 1658   BASOSABS 0.0 09/14/2017 1035   Hgb this am 10.1. Ca 7.6.   General: alert and cooperative Resp: clear to auscultation bilaterally Cardio: regular rate and rhythm, S1, S2 normal, no murmur, click, rub or gallop GI: soft, non-tender; bowel sounds normal; no masses,  no organomegaly and incision: clean, dry and intact Extremities: moving all extremities spontaneously. Vaginal Bleeding: minimal  Assessment: s/p Procedure(s) with comments: XI ROBOTIC  ASSISTED LAPAROSCOPIC SACROCOLPOPEXY (N/A) - 6 hours OR time. Need extended stay recovery bed. ANTERIOR (CYSTOCELE) AND POSTERIOR REPAIR (RECTOCELE) (N/A) TRANSVAGINAL TAPE (TVT) PROCEDURE exact midurethral sling (N/A) CYSTOSCOPY (N/A) SALPINGO OOPHORECTOMY (Bilateral): progressing well  Hgb is stable.  Not able to void yet.   Plan: Discharge home after one more voiding trial.  Hydrate well at home.  Discharge instructions and precautions reviewed.  If goes home with foley, will do prophylactic abx.  Rx for Percocet and Motrin.  She will stay off her Revlimid and ASA at this time.  Fu in office in 5 days, sooner as needed.  Surgical findings and procedure again reviewed.  Questions invited an answered.   LOS: 0 days    Arloa Koh 05/15/2018, 12:34 PM

## 2018-05-16 ENCOUNTER — Encounter (HOSPITAL_COMMUNITY): Payer: Self-pay | Admitting: Obstetrics and Gynecology

## 2018-05-16 NOTE — Anesthesia Postprocedure Evaluation (Signed)
Anesthesia Post Note  Patient: Paula Johnson  Procedure(s) Performed: XI ROBOTIC ASSISTED LAPAROSCOPIC SACROCOLPOPEXY (N/A Abdomen) ANTERIOR (CYSTOCELE) AND POSTERIOR REPAIR (RECTOCELE) (N/A Vagina ) TRANSVAGINAL TAPE (TVT) PROCEDURE exact midurethral sling (N/A Urethra) CYSTOSCOPY (N/A Bladder) SALPINGO OOPHORECTOMY (Bilateral Abdomen)     Patient location during evaluation: PACU Anesthesia Type: General Level of consciousness: awake and alert Pain management: pain level controlled Vital Signs Assessment: post-procedure vital signs reviewed and stable Respiratory status: spontaneous breathing, nonlabored ventilation, respiratory function stable and patient connected to nasal cannula oxygen Cardiovascular status: blood pressure returned to baseline and stable Postop Assessment: no apparent nausea or vomiting Anesthetic complications: no    Last Vitals:  Vitals:   05/15/18 0610 05/15/18 1015  BP: 109/72 121/69  Pulse: 90 99  Resp: 16 16  Temp: 36.9 C 36.9 C  SpO2: 99% 99%    Last Pain:  Vitals:   05/15/18 1300  TempSrc:   PainSc: 0-No pain                 Latishia Suitt S

## 2018-05-17 ENCOUNTER — Telehealth: Payer: Self-pay | Admitting: Emergency Medicine

## 2018-05-17 NOTE — Telephone Encounter (Signed)
-----   Message from [name], MD sent at 05/17/2018 12:42 PM EDT ----- Please share results of pathology specimen showing normal tubes and ovaries.  I hope she is doing well in her post op recovery so far.  I will see her next week for her first post op check.

## 2018-05-17 NOTE — Telephone Encounter (Signed)
Spoke with patient and message from Dr. Quincy Simmonds given.  Will follow up at office visit.  Encounter closed.

## 2018-05-20 ENCOUNTER — Ambulatory Visit (INDEPENDENT_AMBULATORY_CARE_PROVIDER_SITE_OTHER): Payer: 59 | Admitting: Obstetrics and Gynecology

## 2018-05-20 VITALS — BP 115/68 | HR 74 | Resp 16 | Ht 68.0 in | Wt 131.3 lb

## 2018-05-20 DIAGNOSIS — R338 Other retention of urine: Secondary | ICD-10-CM | POA: Diagnosis not present

## 2018-05-20 DIAGNOSIS — N9989 Other postprocedural complications and disorders of genitourinary system: Secondary | ICD-10-CM | POA: Diagnosis not present

## 2018-05-20 NOTE — Progress Notes (Signed)
GYNECOLOGY  VISIT   HPI: 62 y.o.   Married  Caucasian  female   G2P2002 with Patient's last menstrual period was 09/26/1995 (within months).   here for 1 week post op XI ROBOTIC ASSISTED LAPAROSCOPIC SACROCOLPOPEXY (N/A Abdomen) ANTERIOR (CYSTOCELE) AND POSTERIOR REPAIR (RECTOCELE) (N/A Vagina ) TRANSVAGINAL TAPE (TVT) PROCEDURE exact midurethral sling (N/A Urethra) CYSTOSCOPY (N/A Bladder) SALPINGO OOPHORECTOMY (Bilateral Abdomen)  Patient states that she is not having any pain.  Does have minor discomfort. Just used Percocet the first night.  Not even taken a Tylenol since then.   Some tan discharge.   Having BMs.   Eating Ok.   Pathology report - normal ovaries and tubes.   GYNECOLOGIC HISTORY: Patient's last menstrual period was 09/26/1995 (within months). Contraception:  Hysterectomy Menopausal hormone therapy:  Vaginal estrogen cream Last mammogram:  01/11/18 BIRADS 1 negative Last pap smear:   03/22/17 ASCUS:Neg HR HPV        OB History    Gravida  2   Para  2   Term  2   Preterm      AB      Living  2     SAB      TAB      Ectopic      Multiple      Live Births                 Patient Active Problem List   Diagnosis Date Noted  . Status post surgery 05/14/2018  . Cystocele, unspecified (CODE) 03/06/2018  . Vaginal granulation tissue 12/03/2017  . Urinary tract infection without hematuria   . Muscular fasciculation   . Other fatigue   . Abdominal pain   . FTT (failure to thrive) in adult 08/09/2017  . Counseling regarding advanced care planning and goals of care 08/03/2017  . Gastroesophageal reflux disease without esophagitis 01/17/2017  . S/P autologous bone marrow transplantation ([place]) 03/29/2016  . Near syncope 01/04/2016  . Peripheral edema 12/19/2015  . Hematuria 11/08/2015  . Rash 11/01/2015  . Multiple myeloma ([place]) 10/19/2015  . Metastatic multiple myeloma to bone ([place]) 10/19/2015  . Plasma cell dyscrasia 10/05/2015  .  Anemia 09/29/2015  . Hypergammaglobulinemia 09/29/2015  . HYPOXEMIA 12/16/2007  . HYPERLIPIDEMIA 12/04/2007  . DYSPNEA 12/04/2007  . MALIGNANT NEOPLASM OF THYMUS 12/03/2007  . OBSTRUCTIVE SLEEP APNEA 12/03/2007  . Restless leg syndrome 12/03/2007    Past Medical History:  Diagnosis Date  . Abnormal Pap smear of vagina 03/22/2017   ASCUS pap and negative HR HPV.  Patient is on immunosuppressive medication.   . Atrial septal aneurysm per 11-30-17 echo   trivial pericardial effusion  . Cancer ([place]) 1999   squamous cell carcinoma of thymus  . Fibroid    reason for Hysterectomy  . Migraine    episode of aphasia worked up with echo by [place] cardiology 11-23-17, has visual migraines  . Multiple myeloma ([place]) 08/2015  . Multiple myeloma ([place])   . PONV (postoperative nausea and vomiting)    thinks morphine caused nausea  . Sleep apnea    uses old oral allialnce for osa due to cpap intolerance  . Urinary incontinence     Past Surgical History:  Procedure Laterality Date  . ABDOMINAL HYSTERECTOMY  1997   TAH--ovaries remain--Dr. Newton Pigg  . ANTERIOR AND POSTERIOR REPAIR N/A 05/14/2018   Procedure: ANTERIOR (CYSTOCELE) AND POSTERIOR REPAIR (RECTOCELE);  Surgeon: [name], MD;  Location: [facility];  Service: Gynecology;  Laterality: N/A;  . BLADDER SUSPENSION N/A 05/14/2018   Procedure: TRANSVAGINAL TAPE (TVT) PROCEDURE exact midurethral sling;  Surgeon: [name], MD;  Location: [facility];  Service: Gynecology;  Laterality: N/A;  . BONE MARROW TRANSPLANT  2017   done at [place]  . CYSTOSCOPY N/A 05/14/2018   Procedure: CYSTOSCOPY;  Surgeon: [name], MD;  [facility]: [facility];  Service: Gynecology;  Laterality: N/A;  . ROBOTIC ASSISTED LAPAROSCOPIC SACROCOLPOPEXY N/A 05/14/2018   Procedure: XI ROBOTIC ASSISTED LAPAROSCOPIC SACROCOLPOPEXY;  Surgeon: [name], MD;  Location: [facility];  Service: Gynecology;  Laterality: N/A;  6 hours OR  time. Need extended stay recovery bed.  Marland Kitchen SALPINGOOPHORECTOMY Bilateral 05/14/2018   Procedure: SALPINGO OOPHORECTOMY;  Surgeon: [name], MD;  [facility];  Service: Gynecology;  Laterality: Bilateral;  . THYMECTOMY  1999    Current Outpatient Medications  Medication Sig Dispense Refill  . Calcium-Magnesium (CAL/MAG PO) Take 1 tablet by mouth 2 (two) times daily.     No current facility-administered medications for this visit.      ALLERGIES: Ampicillin and Penicillins  Family History  Problem Relation Age of Onset  . COPD Father        dec age 51/s-smoked  . Macular degeneration Father   . Diabetes Brother   . Other Brother        committed suicide age 75  . Macular degeneration Brother   . Hyperlipidemia Mother   . Diabetes Maternal Grandmother   . Heart attack Paternal Grandfather     Social History   Socioeconomic History  . Marital status: Married    Spouse name: [name]  . Number of children: 2  . Years of education: college  . 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 Financial resource strain: Not on file  . Food insecurity:    Worry: Not on file    Inability: Not on file  . Transportation needs:    Medical: Not on file    Non-medical: Not on file  Tobacco Use  . Smoking status: Never Smoker  . Smokeless tobacco: Never Used  Substance and Sexual Activity  . Alcohol use: Yes    Alcohol/week: 0.0 standard drinks    Comment: once a year drink  . Drug use: Never  . Sexual activity: Not Currently    Partners: Male    Birth control/protection: Surgical    Comment: TAH--ovaries remain  Lifestyle  . Physical activity:    Days per week: Not on file    Minutes per session: Not on file  . Stress: Not on file  Relationships  . Social connections:    Talks on phone: Not on file    Gets together: Not on file    Attends religious service: Not on file     Active member of club or organization: Not on file    Attends meetings of clubs or organizations: Not on file    Relationship status: Not on file  . Intimate partner violence:    Fear of current or ex partner: Not on file    Emotionally abused: Not on file    Physically abused: Not on file    Forced sexual activity: Not on file  Other Topics Concern  . Not on file  Social History Narrative   Lives with husband   Caffeine- coffee, 2 cups daily  Review of Systems  All other systems reviewed and are negative.   PHYSICAL EXAMINATION:    BP 115/68   Pulse 74   Resp 16   Ht _0  (1.727 m)   Wt 131 lb 4.8 oz (59.6 kg)   LMP 09/26/1995 (Within Months)   BMI 19.96 kg/m     General appearance: alert, cooperative and appears stated age   Abdomen: incisions all intact.  Right lateral incision with 1 cm of induration and no drainage.  Abdomen is soft, non-tender, no masses,  no organomegaly    Pelvic: External genitalia:  no lesions              Urethra:  normal appearing urethra with no masses, tenderness or lesions              Bartholins and Skenes: normal                              Bimanual Exam:  Uterus:   Absent.  No vaginal agglutination.  Suture lines intact and no palpable mesh.              Adnexa: no mass, fullness, tenderness               Retrograde filling of Foley with 250 cc normal saline.  Foley then removed.  Void attempt unsuccessful.  Patient taught intermittent self cath and was successful.   Chaperone was present for exam.  ASSESSMENT  STATUS POST ROBOTIC ASSISTED LAPAROSCOPIC SACROCOLPOPEXY   ANTERIOR (CYSTOCELE) AND POSTERIOR REPAIR (RECTOCELE)   TRANSVAGINAL TAPE (TVT) PROCEDURE EXACT MIDURETHRAL SLING,   CYSTOSCOPY, BILATERAL SALPINGO OOPHORECTOMY    Urinary retention post op.   PLAN  Patient to do intermittent self cath.  Procedure reviewed and frequency of cathing discussed.  FU in about 8 days.    An After Visit Summary was printed  and given to the patient.

## 2018-05-22 ENCOUNTER — Encounter: Payer: Self-pay | Admitting: Obstetrics and Gynecology

## 2018-05-23 NOTE — Discharge Summary (Signed)
Physician Discharge Summary  Patient ID: Paula Johnson MRN: [id number] DOB/AGE: Jan 08, 1956 62 y.o.  Admit date: 05/14/2018 Discharge date:  05/15/18  Admission Diagnoses: 1.  Vaginal vault prolapse following hysterectomy.  2.  Genuine stress incontinence.   Discharge Diagnoses:  1.  Vaginal vault prolapse following hysterectomy.  2.  Genuine stress incontinence.  3.  Urinary retention.  4.  Status post robotically assisted sacrocolpopexy, bilateral salpingo-oophorectomy, anterior and posterior colporrhaphy, TVT Exact midurethral sling and cystoscopy  Active Problems:   Status post surgery   Discharged Condition: good  Hospital Course:  The patient was admitted on 05/14/18 for a robotically assisted sacrocolpopexy, bilateral salpingo-oophorectomy, anterior and posterior colporrhaphy, TVT Exact midurethral sling and cystoscopy, which were performed without complication while under general anesthesia.  The patient's post op course was uneventful.  She had a morphine and Toradol for pain control initially, and this was converted over to Percocet and Motrin when the patient began taking po well.  She ambulated independently and received Lovenox, PAS and Ted hose for DVT prophylaxis.  Her foley catheter were removed on post op day one, and she was not able to void. The patient's vital signs remained stable and she demonstrated no signs of infection during her hospitalization.  The patient's post op day one Hgb was 10.1  This was rechecked prior to discharge, and the repeat Hgb was 10.3.   She was tolerating the this well.  She had very minimal vaginal bleeding, and her incision(s) demonstrated no signs of erythema or significant drainage.  She was found to be in good condition and ready for discharge on post op day one.  Consults: None  Significant Diagnostic Studies: labs:  See Hospital Course.  Treatments: surgery:  Robotically assistedbilateral salpingo-oophorectomy,sacral colpopexy,  anterior and posterior colporrhaphy, TVT Exact midurethral sling and cystoscopy.  Discharge Exam:  Blood pressure 121/69, pulse 99, temperature 98.4 F (36.9 C), resp. rate 16, height 5\' 8"  (1.727 m), weight 131 lb (59.4 kg), last menstrual period 09/26/1995, SpO2 99 %.   General: alert and cooperative Resp: clear to auscultation bilaterally Cardio: regular rate and rhythm, S1, S2 normal, no murmur, click, rub or gallop GI: soft, non-tender; bowel sounds normal; no masses,  no organomegaly and incision: clean, dry and intact Extremities: moving all extremities spontaneously. Vaginal Bleeding: minimal  Disposition: Discharge disposition: 01-Home or Self Care     Instructions were given in written and oral form.   Discharge Instructions    Discharge patient   Complete by:  As directed    Discharge disposition:  01-Home or Self Care   Discharge patient date:  05/15/2018     Allergies as of 05/15/2018      Reactions   Ampicillin Rash   Penicillins Rash   Has patient had a PCN reaction causing immediate rash, facial/tongue/throat swelling, SOB or lightheadedness with hypotension: Y Has patient had a PCN reaction causing severe rash involving mucus membranes or skin necrosis: Y Has patient had a PCN reaction that required hospitalization: N Has patient had a PCN reaction occurring within the last 10 years: N If all of the above answers are "NO", then may proceed with Cephalosporin use.      Medication List    STOP taking these medications   aspirin EC 81 MG tablet   estradiol 0.1 MG/GM vaginal cream Commonly known as:  ESTRACE   naproxen sodium 220 MG tablet Commonly known as:  ALEVE   REVLIMID 10 MG capsule Generic drug:  lenalidomide  TAKE these medications   CAL/MAG PO Take 1 tablet by mouth 2 (two) times daily.      The patient received a prescription for Percocet 5 mg/325 mg and Motrin for pain.  She also received a prescription for Ciprofloxacin 250 mg po  bid x 7 days for UTI prophylaxis due to multiple catheterizations.   Signed: Arloa Koh 05/26/2018, 10:06 AM

## 2018-05-29 ENCOUNTER — Ambulatory Visit (INDEPENDENT_AMBULATORY_CARE_PROVIDER_SITE_OTHER): Payer: 59 | Admitting: Obstetrics and Gynecology

## 2018-05-29 ENCOUNTER — Telehealth: Payer: Self-pay | Admitting: *Deleted

## 2018-05-29 ENCOUNTER — Other Ambulatory Visit: Payer: Self-pay

## 2018-05-29 ENCOUNTER — Encounter: Payer: Self-pay | Admitting: Obstetrics and Gynecology

## 2018-05-29 ENCOUNTER — Encounter (HOSPITAL_BASED_OUTPATIENT_CLINIC_OR_DEPARTMENT_OTHER): Payer: Self-pay

## 2018-05-29 VITALS — BP 122/70 | HR 68 | Resp 16 | Ht 68.0 in | Wt 128.0 lb

## 2018-05-29 DIAGNOSIS — R338 Other retention of urine: Secondary | ICD-10-CM

## 2018-05-29 DIAGNOSIS — N9989 Other postprocedural complications and disorders of genitourinary system: Secondary | ICD-10-CM

## 2018-05-29 DIAGNOSIS — R3129 Other microscopic hematuria: Secondary | ICD-10-CM

## 2018-05-29 LAB — POCT URINALYSIS DIPSTICK
Bilirubin, UA: NEGATIVE
Glucose, UA: NEGATIVE
KETONES UA: NEGATIVE
Leukocytes, UA: NEGATIVE
NITRITE UA: NEGATIVE
PROTEIN UA: NEGATIVE
UROBILINOGEN UA: 0.2 U/dL
pH, UA: 5 (ref 5.0–8.0)

## 2018-05-29 MED ORDER — LACTATED RINGERS IV SOLN
INTRAVENOUS | Status: DC
  Filled 2018-05-29: qty 1000

## 2018-05-29 NOTE — Pre-Procedure Instructions (Signed)
Dr. Collins Scotland reviewed [name]'s medical history she is cleared to have her procedure at the [facility].

## 2018-05-29 NOTE — Telephone Encounter (Signed)
Revision of TVT/ cysto scheduled for 05-30-18 at 0900 at [facility].  Call to patient. Notified of surgery information. Advised to arrive at 0700. Surgery instructions reviewed.   Routing to Dr Quincy Simmonds. Encounter closed.

## 2018-05-29 NOTE — Progress Notes (Signed)
GYNECOLOGY  VISIT   HPI: 62 y.o.   Married  Caucasian  female   G2P2002 with Patient's last menstrual period was 09/26/1995 (within months).   here for 8 day f/u.  Not able to void since her midurethral sling done at the time of her robotic sacrocolpopopexy done on 05/14/18.   Emptying every 2 - 3 hours with catheterization.  Not voiding on her own.  Dribbled once.  No pelvic pain or discomfort.  No fevers.   Doing herbal therapy to try to help void.   Not back on Revlimid.  Not on ASA either.   Has foot surgery Oct. 1.   GYNECOLOGIC HISTORY: Patient's last menstrual period was 09/26/1995 (within months). Contraception:  Hysterectomy Menopausal hormone therapy:  Vaginal estrogen cream Last mammogram:  01/11/18 BIRADS 1 negative Last pap smear:   03/22/17 ASCUS:Neg HR HPV        OB History    Gravida  2   Para  2   Term  2   Preterm      AB      Living  2     SAB      TAB      Ectopic      Multiple      Live Births                 Patient Active Problem List   Diagnosis Date Noted  . Status post surgery 05/14/2018  . Cystocele, unspecified (CODE) 03/06/2018  . Vaginal granulation tissue 12/03/2017  . Urinary tract infection without hematuria   . Muscular fasciculation   . Other fatigue   . Abdominal pain   . FTT (failure to thrive) in adult 08/09/2017  . Counseling regarding advanced care planning and goals of care 08/03/2017  . Gastroesophageal reflux disease without esophagitis 01/17/2017  . S/P autologous bone marrow transplantation ([place]) 03/29/2016  . Near syncope 01/04/2016  . Peripheral edema 12/19/2015  . Hematuria 11/08/2015  . Rash 11/01/2015  . Multiple myeloma ([place]) 10/19/2015  . Metastatic multiple myeloma to bone ([place]) 10/19/2015  . Plasma cell dyscrasia 10/05/2015  . Anemia 09/29/2015  . Hypergammaglobulinemia 09/29/2015  . HYPOXEMIA 12/16/2007  . HYPERLIPIDEMIA 12/04/2007  . DYSPNEA 12/04/2007  . MALIGNANT NEOPLASM OF  THYMUS 12/03/2007  . OBSTRUCTIVE SLEEP APNEA 12/03/2007  . Restless leg syndrome 12/03/2007    Past Medical History:  Diagnosis Date  . Abnormal Pap smear of vagina 03/22/2017   ASCUS pap and negative HR HPV.  Patient is on immunosuppressive medication.   . Atrial septal aneurysm per 11-30-17 echo   trivial pericardial effusion  . Cancer ([place]) 1999   squamous cell carcinoma of thymus  . Fibroid    reason for Hysterectomy  . Migraine    episode of aphasia worked up with echo by [place] cardiology 11-23-17, has visual migraines  . Multiple myeloma ([place]) 08/2015  . Multiple myeloma ([place])   . PONV (postoperative nausea and vomiting)    thinks morphine caused nausea  . Sleep apnea    uses old oral allialnce for osa due to cpap intolerance  . Urinary incontinence     Past Surgical History:  Procedure Laterality Date  . ABDOMINAL HYSTERECTOMY  1997   TAH--ovaries remain--Dr. Newton Pigg  . ANTERIOR AND POSTERIOR REPAIR N/A 05/14/2018   Procedure: ANTERIOR (CYSTOCELE) AND POSTERIOR REPAIR (RECTOCELE);  Surgeon: [name], MD;  [facility];  Service: Gynecology;  Laterality: N/A;  . BLADDER SUSPENSION N/A 05/14/2018  Procedure: TRANSVAGINAL TAPE (TVT) PROCEDURE exact midurethral sling;  Surgeon: [name], MD;  [facility];  Service: Gynecology;  Laterality: N/A;  . BONE MARROW TRANSPLANT  2017   done at [place]  . CYSTOSCOPY N/A 05/14/2018   Procedure: CYSTOSCOPY;  Surgeon: [name], MD;  [facility];  Service: Gynecology;  Laterality: N/A;  . ROBOTIC ASSISTED LAPAROSCOPIC SACROCOLPOPEXY N/A 05/14/2018   Procedure: XI ROBOTIC ASSISTED LAPAROSCOPIC SACROCOLPOPEXY;  Surgeon: [name], MD;  [facility];  Service: Gynecology;  Laterality: N/A;  6 hours OR time. Need extended stay recovery bed.  Marland Kitchen SALPINGOOPHORECTOMY Bilateral 05/14/2018   Procedure: SALPINGO OOPHORECTOMY;  Surgeon: [name], MD;   [facility];  Service: Gynecology;  Laterality: Bilateral;  . THYMECTOMY  1999    Current Outpatient Medications  Medication Sig Dispense Refill  . Calcium-Magnesium (CAL/MAG PO) Take 1 tablet by mouth 2 (two) times daily.    . NON FORMULARY [place] - take 1g po daily    . Turmeric (CURCUMIN 95) 500 MG CAPS Take by mouth daily.     No current facility-administered medications for this visit.      ALLERGIES: Ampicillin and Penicillins  Family History  Problem Relation Age of Onset  . COPD Father        dec age 77/s-smoked  . Macular degeneration Father   . Diabetes Brother   . Other Brother        committed suicide age 59  . Macular degeneration Brother   . Hyperlipidemia Mother   . Diabetes Maternal Grandmother   . Heart attack Paternal Grandfather     Social History   Socioeconomic History  . Marital status: Married    Spouse name: [name]  . Number of children: 2  . Years of education: college  . 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 Financial resource strain: Not on file  . Food insecurity:    Worry: Not on file    Inability: Not on file  . Transportation needs:    Medical: Not on file    Non-medical: Not on file  Tobacco Use  . Smoking status: Never Smoker  . Smokeless tobacco: Never Used  Substance and Sexual Activity  . Alcohol use: Yes    Alcohol/week: 0.0 standard drinks    Comment: once a year drink  . Drug use: Never  . Sexual activity: Not Currently    Partners: Male    Birth control/protection: Surgical    Comment: TAH--ovaries remain  Lifestyle  . Physical activity:    Days per week: Not on file    Minutes per session: Not on file  . Stress: Not on file  Relationships  . Social connections:    Talks on phone: Not on file    Gets together: Not on file    Attends religious service: Not on file    Active member of club or organization: Not on file     Attends meetings of clubs or organizations: Not on file    Relationship status: Not on file  . Intimate partner violence:    Fear of current or ex partner: Not on file    Emotionally abused: Not on file    Physically abused: Not on file    Forced sexual activity: Not on file  Other Topics Concern  . Not on  file  Social History Narrative   Lives with husband   Caffeine- coffee, 2 cups daily    Review of Systems  All other systems reviewed and are negative.   PHYSICAL EXAMINATION:    BP 122/70 (BP Location: Right Arm, Patient Position: Sitting, Cuff Size: Normal)   Pulse 68   Resp 16   Ht _0  (1.727 m)   Wt 128 lb (58.1 kg)   LMP 09/26/1995 (Within Months)   BMI 19.46 kg/m     General appearance: alert, cooperative and appears stated age   Lungs: clear to auscultation bilaterally Heart: regular rate and rhythm Abdomen: incisions intact. Abdomen is soft, non-tender, no masses,  no organomegaly   Pelvic: External genitalia:  no lesions.  SP incisions intact.               Urethra:  normal appearing urethra with no masses, tenderness or lesions              Bartholins and Skenes: normal                 Vagina: normal appearing vagina with normal color and discharge, no lesions. Suture lines present.  No inflammation.              Cervix:  absent                Bimanual Exam:  Uterus:  absent              Adnexa: no mass, fullness, tenderness            Sterile cath with betadine prep - 50 cc urine noted.  Urine dip - mod RBCs, otherwise negative.   Chaperone was present for exam.  ASSESSMENT  Urinary  Retention.  Microscopic hematuria.  I suspect this is from catheterizations.   PLAN  Urine micro and cx.  Options for care discussed - continued self cath or revision of midurethral sling.  To the OR tomorrow for revision of midurethral sling/cystoscopy.  Risks reviewed - bleeding, infection, damage to surrounding organs, reaction to anesthesia, lysis of sling  and stress incontinence.  NPO after midnight.  Cipro IV tomorrow preop.    An After Visit Summary was printed and given to the patient.

## 2018-05-29 NOTE — Progress Notes (Signed)
Spoke with: Kaitlynd NPO:  After Midnight, no gum, candy, or mints   Arrival time: 0700AM Labs: Istat 4 AM medications: None Pre op orders: No orders at this time but Gay Filler made Dr.Silva aware Ride home:  [name] (husband) [phone]

## 2018-05-30 ENCOUNTER — Ambulatory Visit (HOSPITAL_BASED_OUTPATIENT_CLINIC_OR_DEPARTMENT_OTHER): Payer: 59 | Admitting: Anesthesiology

## 2018-05-30 ENCOUNTER — Ambulatory Visit (HOSPITAL_BASED_OUTPATIENT_CLINIC_OR_DEPARTMENT_OTHER)
Admission: RE | Admit: 2018-05-30 | Discharge: 2018-05-30 | Disposition: A | Discharge status: Home / Self Care | Payer: 59 | Source: Ambulatory Visit | Attending: Obstetrics and Gynecology | Admitting: Obstetrics and Gynecology

## 2018-05-30 ENCOUNTER — Encounter (HOSPITAL_BASED_OUTPATIENT_CLINIC_OR_DEPARTMENT_OTHER)
Admission: RE | Disposition: A | Discharge status: Home / Self Care | Payer: Self-pay | Source: Ambulatory Visit | Attending: Obstetrics and Gynecology

## 2018-05-30 ENCOUNTER — Other Ambulatory Visit: Payer: Self-pay

## 2018-05-30 ENCOUNTER — Encounter (HOSPITAL_BASED_OUTPATIENT_CLINIC_OR_DEPARTMENT_OTHER): Payer: Self-pay | Admitting: *Deleted

## 2018-05-30 DIAGNOSIS — R339 Retention of urine, unspecified: Secondary | ICD-10-CM | POA: Diagnosis not present

## 2018-05-30 DIAGNOSIS — C9000 Multiple myeloma not having achieved remission: Secondary | ICD-10-CM | POA: Diagnosis not present

## 2018-05-30 DIAGNOSIS — T8389XA Other specified complication of genitourinary prosthetic devices, implants and grafts, initial encounter: Secondary | ICD-10-CM | POA: Diagnosis present

## 2018-05-30 DIAGNOSIS — G4733 Obstructive sleep apnea (adult) (pediatric): Secondary | ICD-10-CM | POA: Diagnosis not present

## 2018-05-30 DIAGNOSIS — N9989 Other postprocedural complications and disorders of genitourinary system: Secondary | ICD-10-CM

## 2018-05-30 DIAGNOSIS — Z8249 Family history of ischemic heart disease and other diseases of the circulatory system: Secondary | ICD-10-CM | POA: Insufficient documentation

## 2018-05-30 DIAGNOSIS — Z85238 Personal history of other malignant neoplasm of thymus: Secondary | ICD-10-CM | POA: Insufficient documentation

## 2018-05-30 DIAGNOSIS — K219 Gastro-esophageal reflux disease without esophagitis: Secondary | ICD-10-CM | POA: Diagnosis not present

## 2018-05-30 DIAGNOSIS — R3129 Other microscopic hematuria: Secondary | ICD-10-CM | POA: Diagnosis not present

## 2018-05-30 DIAGNOSIS — Z79899 Other long term (current) drug therapy: Secondary | ICD-10-CM | POA: Diagnosis not present

## 2018-05-30 DIAGNOSIS — Y733 Surgical instruments, materials and gastroenterology and urology devices (including sutures) associated with adverse incidents: Secondary | ICD-10-CM | POA: Diagnosis not present

## 2018-05-30 DIAGNOSIS — G2581 Restless legs syndrome: Secondary | ICD-10-CM | POA: Insufficient documentation

## 2018-05-30 DIAGNOSIS — Z880 Allergy status to penicillin: Secondary | ICD-10-CM | POA: Diagnosis not present

## 2018-05-30 DIAGNOSIS — R338 Other retention of urine: Secondary | ICD-10-CM

## 2018-05-30 HISTORY — DX: Hypertrophy of nasal turbinates: J34.3

## 2018-05-30 HISTORY — DX: Deviated nasal septum: J34.2

## 2018-05-30 HISTORY — DX: Other retention of urine: R33.8

## 2018-05-30 HISTORY — DX: Syncope and collapse: R55

## 2018-05-30 HISTORY — DX: Other intervertebral disc degeneration, lumbosacral region: M51.37

## 2018-05-30 HISTORY — DX: Spondylosis without myelopathy or radiculopathy, cervical region: M47.812

## 2018-05-30 HISTORY — DX: Gastro-esophageal reflux disease without esophagitis: K21.9

## 2018-05-30 HISTORY — DX: Heart disease, unspecified: I51.9

## 2018-05-30 HISTORY — DX: Urinary tract infection, site not specified: N39.0

## 2018-05-30 HISTORY — DX: Edema, unspecified: R60.9

## 2018-05-30 HISTORY — DX: Personal history of other diseases of urinary system: Z87.448

## 2018-05-30 HISTORY — DX: Anemia, unspecified: D64.9

## 2018-05-30 HISTORY — DX: Other fatigue: R53.83

## 2018-05-30 HISTORY — PX: TRANSVAGINAL TAPE (TVT) REMOVAL: SHX6154

## 2018-05-30 HISTORY — DX: Other postprocedural complications and disorders of genitourinary system: N99.89

## 2018-05-30 HISTORY — DX: Other cervical disc degeneration, unspecified cervical region: M50.30

## 2018-05-30 HISTORY — DX: Other disorders of plasma-protein metabolism, not elsewhere classified: E88.09

## 2018-05-30 HISTORY — DX: Spondylosis without myelopathy or radiculopathy, lumbar region: M47.816

## 2018-05-30 HISTORY — DX: Presence of spectacles and contact lenses: Z97.3

## 2018-05-30 HISTORY — DX: Hyperlipidemia, unspecified: E78.5

## 2018-05-30 HISTORY — DX: Fasciculation: R25.3

## 2018-05-30 HISTORY — PX: CYSTOSCOPY: SHX5120

## 2018-05-30 HISTORY — DX: Other intervertebral disc degeneration, lumbosacral region without mention of lumbar back pain or lower extremity pain: M51.379

## 2018-05-30 HISTORY — DX: Nonrheumatic mitral (valve) insufficiency: I34.0

## 2018-05-30 HISTORY — DX: Rheumatic tricuspid insufficiency: I07.1

## 2018-05-30 HISTORY — DX: Nonrheumatic aortic (valve) insufficiency: I35.1

## 2018-05-30 HISTORY — DX: Fatty (change of) liver, not elsewhere classified: K76.0

## 2018-05-30 HISTORY — DX: Restless legs syndrome: G25.81

## 2018-05-30 HISTORY — DX: Other ill-defined heart diseases: I51.89

## 2018-05-30 HISTORY — DX: Solitary pulmonary nodule: R91.1

## 2018-05-30 HISTORY — DX: Localized edema: R60.0

## 2018-05-30 HISTORY — DX: Hypergammaglobulinemia, unspecified: D89.2

## 2018-05-30 LAB — CBC WITH DIFFERENTIAL/PLATELET
Basophils Absolute: 0 10*3/uL (ref 0.0–0.1)
Basophils Relative: 0 %
Eosinophils Absolute: 0.4 10*3/uL (ref 0.0–0.7)
Eosinophils Relative: 8 %
HEMATOCRIT: 37.6 % (ref 36.0–46.0)
Hemoglobin: 12.2 g/dL (ref 12.0–15.0)
LYMPHS ABS: 1 10*3/uL (ref 0.7–4.0)
LYMPHS PCT: 17 %
MCH: 30.3 pg (ref 26.0–34.0)
MCHC: 32.4 g/dL (ref 30.0–36.0)
MCV: 93.5 fL (ref 78.0–100.0)
MONOS PCT: 7 %
Monocytes Absolute: 0.4 10*3/uL (ref 0.1–1.0)
NEUTROS ABS: 4 10*3/uL (ref 1.7–7.7)
NEUTROS PCT: 68 %
Platelets: 451 10*3/uL — ABNORMAL HIGH (ref 150–400)
RBC: 4.02 MIL/uL (ref 3.87–5.11)
RDW: 15 % (ref 11.5–15.5)
WBC: 5.8 10*3/uL (ref 4.0–10.5)

## 2018-05-30 LAB — BASIC METABOLIC PANEL
Anion gap: 8 (ref 5–15)
BUN: 19 mg/dL (ref 8–23)
CHLORIDE: 106 mmol/L (ref 98–111)
CO2: 27 mmol/L (ref 22–32)
CREATININE: 0.7 mg/dL (ref 0.44–1.00)
Calcium: 9 mg/dL (ref 8.9–10.3)
GFR calc Af Amer: >60 mL/min (ref >60)
GFR calc non Af Amer: >60 mL/min (ref >60)
Glucose, Bld: 102 mg/dL — ABNORMAL HIGH (ref 70–99)
POTASSIUM: 4.4 mmol/L (ref 3.5–5.1)
Sodium: 141 mmol/L (ref 135–145)

## 2018-05-30 LAB — TYPE AND SCREEN
ABO/RH(D): O POS
Antibody Screen: NEGATIVE

## 2018-05-30 LAB — URINALYSIS, MICROSCOPIC ONLY

## 2018-05-30 LAB — URINE CULTURE: Organism ID, Bacteria: NO GROWTH

## 2018-05-30 SURGERY — TRANSVAGINAL TAPE (TVT) REMOVAL
Anesthesia: General | Site: Vagina

## 2018-05-30 MED ORDER — CIPROFLOXACIN IN D5W 400 MG/200ML IV SOLN
400.0000 mg | Freq: Two times a day (BID) | INTRAVENOUS | Status: DC
  Administered 2018-05-30: 400 mg via INTRAVENOUS
  Filled 2018-05-30: qty 200

## 2018-05-30 MED ORDER — ONDANSETRON HCL 4 MG/2ML IJ SOLN
INTRAMUSCULAR | Status: AC
  Filled 2018-05-30: qty 2

## 2018-05-30 MED ORDER — PROPOFOL 10 MG/ML IV BOLUS
INTRAVENOUS | Status: AC
  Filled 2018-05-30: qty 20

## 2018-05-30 MED ORDER — MIDAZOLAM HCL 2 MG/2ML IJ SOLN
INTRAMUSCULAR | Status: AC
  Filled 2018-05-30: qty 2

## 2018-05-30 MED ORDER — EPHEDRINE SULFATE-NACL 50-0.9 MG/10ML-% IV SOSY
PREFILLED_SYRINGE | INTRAVENOUS | Status: DC | PRN
  Administered 2018-05-30: 10 mg via INTRAVENOUS

## 2018-05-30 MED ORDER — ONDANSETRON HCL 4 MG/2ML IJ SOLN
4.0000 mg | Freq: Once | INTRAMUSCULAR | Status: DC | PRN
  Filled 2018-05-30: qty 2

## 2018-05-30 MED ORDER — CIPROFLOXACIN IN D5W 400 MG/200ML IV SOLN
INTRAVENOUS | Status: AC
  Filled 2018-05-30: qty 200

## 2018-05-30 MED ORDER — ONDANSETRON HCL 4 MG/2ML IJ SOLN
INTRAMUSCULAR | Status: DC | PRN
  Administered 2018-05-30: 4 mg via INTRAVENOUS

## 2018-05-30 MED ORDER — IBUPROFEN 800 MG PO TABS: 800.0000 mg | ORAL_TABLET | Freq: Three times a day (TID) | ORAL | 0 refills | Status: DC | PRN

## 2018-05-30 MED ORDER — FENTANYL CITRATE (PF) 100 MCG/2ML IJ SOLN
INTRAMUSCULAR | Status: DC | PRN
  Administered 2018-05-30: 50 ug via INTRAVENOUS

## 2018-05-30 MED ORDER — LACTATED RINGERS IV SOLN
INTRAVENOUS | Status: DC
  Administered 2018-05-30 (×3): via INTRAVENOUS
  Filled 2018-05-30: qty 1000

## 2018-05-30 MED ORDER — LIDOCAINE 2% (20 MG/ML) 5 ML SYRINGE
INTRAMUSCULAR | Status: AC
  Filled 2018-05-30: qty 5

## 2018-05-30 MED ORDER — PROPOFOL 10 MG/ML IV BOLUS
INTRAVENOUS | Status: DC | PRN
  Administered 2018-05-30: 150 mg via INTRAVENOUS

## 2018-05-30 MED ORDER — LIDOCAINE 2% (20 MG/ML) 5 ML SYRINGE
INTRAMUSCULAR | Status: DC | PRN
  Administered 2018-05-30: 60 mg via INTRAVENOUS

## 2018-05-30 MED ORDER — DEXAMETHASONE SODIUM PHOSPHATE 10 MG/ML IJ SOLN
INTRAMUSCULAR | Status: DC | PRN
  Administered 2018-05-30: 10 mg via INTRAVENOUS

## 2018-05-30 MED ORDER — DEXTROSE 10 % IV SOLN
INTRAVENOUS | Status: AC | PRN
  Administered 2018-05-30: 1000 mL via INTRAVENOUS

## 2018-05-30 MED ORDER — MIDAZOLAM HCL 2 MG/2ML IJ SOLN
INTRAMUSCULAR | Status: DC | PRN
  Administered 2018-05-30: 1 mg via INTRAVENOUS

## 2018-05-30 MED ORDER — DEXAMETHASONE SODIUM PHOSPHATE 10 MG/ML IJ SOLN
INTRAMUSCULAR | Status: AC
  Filled 2018-05-30: qty 1

## 2018-05-30 MED ORDER — HYDROCODONE-ACETAMINOPHEN 5-325 MG PO TABS
ORAL_TABLET | ORAL | Status: AC
  Filled 2018-05-30: qty 1

## 2018-05-30 MED ORDER — FENTANYL CITRATE (PF) 100 MCG/2ML IJ SOLN
INTRAMUSCULAR | Status: AC
  Filled 2018-05-30: qty 2

## 2018-05-30 MED ORDER — SODIUM CHLORIDE 0.9 % IR SOLN
Status: DC | PRN
  Administered 2018-05-30: 3000 mL via INTRAVESICAL

## 2018-05-30 MED ORDER — FENTANYL CITRATE (PF) 100 MCG/2ML IJ SOLN
25.0000 ug | INTRAMUSCULAR | Status: DC | PRN
  Filled 2018-05-30: qty 1

## 2018-05-30 MED ORDER — EPHEDRINE 5 MG/ML INJ
INTRAVENOUS | Status: AC
  Filled 2018-05-30: qty 10

## 2018-05-30 SURGICAL SUPPLY — 29 items
BLADE SURG 11 STRL SS (BLADE) ×3 IMPLANT
BLADE SURG 15 STRL LF DISP TIS (BLADE) ×2 IMPLANT
BLADE SURG 15 STRL SS (BLADE) ×1
CANISTER SUCT 3000ML PPV (MISCELLANEOUS) ×3 IMPLANT
CATH FOLEY 2WAY SLVR  5CC 18FR (CATHETERS) ×1
CATH FOLEY 2WAY SLVR 5CC 18FR (CATHETERS) ×2 IMPLANT
DERMABOND ADVANCED (GAUZE/BANDAGES/DRESSINGS) ×1
DERMABOND ADVANCED .7 DNX12 (GAUZE/BANDAGES/DRESSINGS) ×2 IMPLANT
DURAPREP 26ML APPLICATOR (WOUND CARE) ×3 IMPLANT
GAUZE SPONGE 4X4 16PLY XRAY LF (GAUZE/BANDAGES/DRESSINGS) ×3 IMPLANT
GLOVE BIO SURGEON STRL SZ 6.5 (GLOVE) ×3 IMPLANT
GOWN STRL REUS W/TWL LRG LVL3 (GOWN DISPOSABLE) ×6 IMPLANT
IV NS IRRIG 3000ML ARTHROMATIC (IV SOLUTION) ×3 IMPLANT
NEEDLE HYPO 22GX1.5 SAFETY (NEEDLE) ×3 IMPLANT
PACK VAGINAL WOMENS (CUSTOM PROCEDURE TRAY) ×3 IMPLANT
PACKING VAGINAL (PACKING) IMPLANT
PAD MAGNETIC INST (MISCELLANEOUS) IMPLANT
SET IRRIG Y TYPE TUR BLADDER L (SET/KITS/TRAYS/PACK) ×3 IMPLANT
SHEET LAVH (DRAPES) ×3 IMPLANT
SURGIFLO W/THROMBIN 8M KIT (HEMOSTASIS) IMPLANT
SUT VIC AB 0 CT1 27 (SUTURE)
SUT VIC AB 0 CT1 27XBRD ANBCTR (SUTURE) IMPLANT
SUT VIC AB 2-0 CT2 27 (SUTURE) IMPLANT
SUT VIC AB 2-0 SH 27 (SUTURE) ×1
SUT VIC AB 2-0 SH 27XBRD (SUTURE) ×2 IMPLANT
SYR BULB IRRIGATION 50ML (SYRINGE) ×3 IMPLANT
TOWEL OR 17X24 6PK STRL BLUE (TOWEL DISPOSABLE) ×6 IMPLANT
TRAY FOLEY W/BAG SLVR 14FR (SET/KITS/TRAYS/PACK) ×3 IMPLANT
TUBE CONNECTING 12X1/4 (SUCTIONS) ×3 IMPLANT

## 2018-05-30 NOTE — Progress Notes (Signed)
#  16fr foley inserted without difficulty. Periurethral edema noted.  Clear dilute urine obtained.  Pt tolerated procedure well.

## 2018-05-30 NOTE — Anesthesia Procedure Notes (Signed)
Procedure Name: LMA Insertion Date/Time: 05/30/2018 9:14 AM Performed by: Suan Halter, CRNA Pre-anesthesia Checklist: Patient identified, Emergency Drugs available, Suction available and Patient being monitored Patient Re-evaluated:Patient Re-evaluated prior to induction Oxygen Delivery Method: Circle system utilized Preoxygenation: Pre-oxygenation with 100% oxygen Induction Type: IV induction Ventilation: Mask ventilation without difficulty LMA: LMA inserted LMA Size: 4.0 Number of attempts: 1 Airway Equipment and Method: Bite block Placement Confirmation: positive ETCO2 Tube secured with: Tape Dental Injury: Teeth and Oropharynx as per pre-operative assessment

## 2018-05-30 NOTE — Transfer of Care (Signed)
Immediate Anesthesia Transfer of Care Note  Patient: Paula Johnson  Procedure(s) Performed: Procedure(s) (LRB): TRANSVAGINAL TAPE (TVT)  Revision (N/A) CYSTOSCOPY (N/A)  Patient Location: PACU  Anesthesia Type: General  Level of Consciousness: awake, oriented, sedated and patient cooperative  Airway & Oxygen Therapy: Patient Spontanous Breathing and Patient connected to face mask oxygen  Post-op Assessment: Report given to PACU RN and Post -op Vital signs reviewed and stable  Post vital signs: Reviewed and stable  Complications: No apparent anesthesia complications  Last Vitals:  Vitals Value Taken Time  BP 117/74 05/30/2018 10:37 AM  Temp 36.8 C 05/30/2018 10:37 AM  Pulse 95 05/30/2018 10:38 AM  Resp 20 05/30/2018 10:38 AM  SpO2 100 % 05/30/2018 10:38 AM  Vitals shown include unvalidated device data.  Last Pain:  Vitals:   05/30/18 0706  TempSrc: Oral      Patients Stated Pain Goal: 5 (05/30/18 0806)

## 2018-05-30 NOTE — Brief Op Note (Signed)
05/30/2018  10:38 AM  PATIENT:  Paula Johnson  62 y.o. female  PRE-OPERATIVE DIAGNOSIS:  post op urinary retention  POST-OPERATIVE DIAGNOSIS:  post op urinary retention  PROCEDURE:  Procedure(s): TRANSVAGINAL TAPE (TVT)  Revision (N/A) CYSTOSCOPY (N/A)  SURGEON:  Surgeon(s) and Role:    * Amundson Raliegh Ip, MD - Primary  PHYSICIAN ASSISTANT:  NA  ASSISTANTS: none   ANESTHESIA:   LMA  EBL:  10 mL   BLOOD ADMINISTERED:none  DRAINS: none   LOCAL MEDICATIONS USED:  NONE  SPECIMEN:  No Specimen  DISPOSITION OF SPECIMEN:  N/A  COUNTS:  YES  TOURNIQUET:  * No tourniquets in log *  DICTATION: .Note written in EPIC  PLAN OF CARE: Discharge to home after PACU  PATIENT DISPOSITION:  PACU - hemodynamically stable.   Delay start of Pharmacological VTE agent (>24hrs) due to surgical blood loss or risk of bleeding: not applicable

## 2018-05-30 NOTE — Anesthesia Preprocedure Evaluation (Addendum)
Anesthesia Evaluation  Patient identified by MRN, date of birth, ID band Patient awake    Reviewed: Allergy & Precautions, NPO status , Patient's Chart, lab work & pertinent test results  History of Anesthesia Complications (+) history of anesthetic complications (related to morphine)  Airway Mallampati: I  TM Distance: >3 FB Neck ROM: Full    Dental no notable dental hx.    Pulmonary sleep apnea ,    Pulmonary exam normal breath sounds clear to auscultation       Cardiovascular negative cardio ROS Normal cardiovascular exam+ Valvular Problems/Murmurs (Trace)  Rhythm:Regular Rate:Normal  ECG: NSR, Biatrial enlargement, rate 94  ECHO:  Normal LV systolic function; mild diastolic dysfunction; trace AI, MR and TR; atrial septal aneurysm.   Neuro/Psych  Headaches, negative psych ROS   GI/Hepatic Neg liver ROS, GERD  Controlled,  Endo/Other  negative endocrine ROS  Renal/GU negative Renal ROS     Musculoskeletal negative musculoskeletal ROS (+)   Abdominal   Peds  Hematology  (+) anemia , HLD   Anesthesia Other Findings post op urinary retention  Reproductive/Obstetrics hcg negative                            Anesthesia Physical Anesthesia Plan  ASA: II  Anesthesia Plan: General   Post-op Pain Management:    Induction: Intravenous  PONV Risk Score and Plan: 4 or greater and Midazolam, Dexamethasone, Ondansetron and Treatment may vary due to age or medical condition  Airway Management Planned: LMA  Additional Equipment:   Intra-op Plan:   Post-operative Plan: Extubation in OR  Informed Consent: I have reviewed the patients History and Physical, chart, labs and discussed the procedure including the risks, benefits and alternatives for the proposed anesthesia with the patient or authorized representative who has indicated his/her understanding and acceptance.   Dental advisory  given  Plan Discussed with: CRNA  Anesthesia Plan Comments:         Anesthesia Quick Evaluation

## 2018-05-30 NOTE — Discharge Instructions (Addendum)
Paula Johnson,   I was able to loosen the sling! Everything looked normal inside your bladder and in there vagina when you previous surgery was done.  You did well.  Josefa Half, MD   Please call for fever, heavy vaginal bleeding, or pain uncontrolled by Motrin and your pre-existing prescription for Percocet.  Please let me know if you have difficulty with voiding or pain with urination that is not treatable with pain medication.   I would like to see you in the office next week toward the beginning of the week.   HOME CARE INSTRUCTIONS FOR VAGINAL SLING  Activity:  -No lifting greater than 10-15 pounds for 3 months, or as instructed by your  physician.             -No sexual intercourse for 3 months Diet:  You may return to your normal diet tomorrow. It is important to keep your bowels regular during the postoperative period. To avoid constipation, drink plenty of fluids during the day (8-10 glasses) and eat plenty of fresh fruits and vegetables.  Use a mild laxative or stool softener if necessary.  Wound Care:  You may begin showering tomorrow, or as instructed by your physician.      Return to Work as instructed by your physician.    Special Instructions:   Call your physician if any of these symptoms occur:   -temperature greater than 101 degrees Farenheit.   -redness, swelling or drainage at incision site.   -foul odor of your urine.   -a significant decrease in the amount of urine you have every day.   -severe pain not relieved by your pain medication.   Post Anesthesia Home Care Instructions  Activity: Get plenty of rest for the remainder of the day. A responsible individual must stay with you for 24 hours following the procedure.  For the next 24 hours, DO NOT: -Drive a car -Paediatric nurse -Drink alcoholic beverages -Take any medication unless instructed by your physician -Make any legal decisions or sign important papers.  Meals: Start with liquid foods such as  gelatin or soup. Progress to regular foods as tolerated. Avoid greasy, spicy, heavy foods. If nausea and/or vomiting occur, drink only clear liquids until the nausea and/or vomiting subsides. Call your physician if vomiting continues.  Special Instructions/Symptoms: Your throat may feel dry or sore from the anesthesia or the breathing tube placed in your throat during surgery. If this causes discomfort, gargle with warm salt water. The discomfort should disappear within 24 hours.

## 2018-05-30 NOTE — H&P (Signed)
Office Visit Open    05/29/2018 [place] Silva, Everardo All, MD  Obstetrics and Gynecology   Postoperative urinary retention +1 more  Dx   Office Visit   ; Referred by [name], MD  Reason for Visit   Additional Documentation   Vitals:   BP 122/70 (BP Location: Right Arm, Patient Position: Sitting, Cuff Size: Normal)   Pulse 68   Resp 16   Ht _0  (1.727 m)   Wt 58.1 kg   LMP 09/26/1995 (Within Months)   BMI 19.46 kg/m   BSA 1.67 m     More Vitals   Flowsheets:   MEWS Score,   Anthropometrics     Encounter Info:   Billing Info,   History,   Allergies,   Detailed Report     All Notes   Progress Notes by [name], MD at 05/29/2018 1:15 PM  Author: [name], MD Author Type: Physician Filed: 05/29/2018 4:47 PM  Note Status: Sign at close encounter Cosign: Cosign Not Required Encounter Date: 05/29/2018  Editor: [name], MD (Physician)  Prior Versions: 1. Archie Balboa, CMA (Certified Medical Assistant) at 05/29/2018 1:27 PM - Sign at close encounter    GYNECOLOGY  VISIT   HPI: 62 y.o.   Married  Caucasian  female   G2P2002 with Patient's last menstrual period was 09/26/1995 (within months).   here for 8 day f/u.  Not able to void since her midurethral sling done at the time of her robotic sacrocolpopopexy done on 05/14/18.   Emptying every 2 - 3 hours with catheterization.  Not voiding on her own.  Dribbled once.  No pelvic pain or discomfort.  No fevers.   Doing herbal therapy to try to help void.   Not back on Revlimid.  Not on ASA either.   Has foot surgery Oct. 1.   GYNECOLOGIC HISTORY: Patient's last menstrual period was 09/26/1995 (within months). Contraception:  Hysterectomy Menopausal hormone therapy:  Vaginal estrogen cream Last mammogram:  01/11/18 BIRADS 1 negative Last pap smear:   03/22/17 ASCUS:Neg HR HPV                OB History    Gravida    2   Para  2   Term  2   Preterm      AB      Living  2     SAB      TAB      Ectopic      Multiple      Live Births                     Patient Active Problem List   Diagnosis Date Noted  . Status post surgery 05/14/2018  . Cystocele, unspecified (CODE) 03/06/2018  . Vaginal granulation tissue 12/03/2017  . Urinary tract infection without hematuria   . Muscular fasciculation   . Other fatigue   . Abdominal pain   . FTT (failure to thrive) in adult 08/09/2017  . Counseling regarding advanced care planning and goals of care 08/03/2017  . Gastroesophageal reflux disease without esophagitis 01/17/2017  . S/P autologous bone marrow transplantation ([facility]) 03/29/2016  . Near syncope 01/04/2016  . Peripheral edema 12/19/2015  . Hematuria 11/08/2015  . Rash 11/01/2015  . Multiple myeloma ([place]) 10/19/2015  . Metastatic multiple myeloma to bone ([place]) 10/19/2015  . Plasma cell dyscrasia  10/05/2015  . Anemia 09/29/2015  . Hypergammaglobulinemia 09/29/2015  . HYPOXEMIA 12/16/2007  . HYPERLIPIDEMIA 12/04/2007  . DYSPNEA 12/04/2007  . MALIGNANT NEOPLASM OF THYMUS 12/03/2007  . OBSTRUCTIVE SLEEP APNEA 12/03/2007  . Restless leg syndrome 12/03/2007        Past Medical History:  Diagnosis Date  . Abnormal Pap smear of vagina 03/22/2017   ASCUS pap and negative HR HPV.  Patient is on immunosuppressive medication.   . Atrial septal aneurysm per 11-30-17 echo   trivial pericardial effusion  . Cancer ([place]) 1999   squamous cell carcinoma of thymus  . Fibroid    reason for Hysterectomy  . Migraine    episode of aphasia worked up with echo by [facility] 11-23-17, has visual migraines  . Multiple myeloma ([place]) 08/2015  . Multiple myeloma ([place])   . PONV (postoperative nausea and vomiting)    thinks morphine caused nausea  . Sleep apnea    uses old oral allialnce for osa due to cpap intolerance  . Urinary incontinence          Past  Surgical History:  Procedure Laterality Date  . ABDOMINAL HYSTERECTOMY  1997   TAH--ovaries remain--Dr. Newton Pigg  . ANTERIOR AND POSTERIOR REPAIR N/A 05/14/2018   Procedure: ANTERIOR (CYSTOCELE) AND POSTERIOR REPAIR (RECTOCELE);  Surgeon: [name], MD;  Location: [facility];  Service: Gynecology;  Laterality: N/A;  . BLADDER SUSPENSION N/A 05/14/2018   Procedure: TRANSVAGINAL TAPE (TVT) PROCEDURE exact midurethral sling;  Surgeon: [name], MD;  Location: [facility];  Service: Gynecology;  Laterality: N/A;  . BONE MARROW TRANSPLANT  2017   done at [employer]  . CYSTOSCOPY N/A 05/14/2018   Procedure: CYSTOSCOPY;  Surgeon: [name], MD;  [facility];  Service: Gynecology;  Laterality: N/A;  . ROBOTIC ASSISTED LAPAROSCOPIC SACROCOLPOPEXY N/A 05/14/2018   Procedure: XI ROBOTIC ASSISTED LAPAROSCOPIC SACROCOLPOPEXY;  Surgeon: [name], MD;  [facility]: [facility];  Service: Gynecology;  Laterality: N/A;  6 hours OR time. Need extended stay recovery bed.  Marland Kitchen SALPINGOOPHORECTOMY Bilateral 05/14/2018   Procedure: SALPINGO OOPHORECTOMY;  Surgeon: [name], MD;  [facility];  Service: Gynecology;  Laterality: Bilateral;  . THYMECTOMY  1999          Current Outpatient Medications  Medication Sig Dispense Refill  . Calcium-Magnesium (CAL/MAG PO) Take 1 tablet by mouth 2 (two) times daily.    . NON FORMULARY Lion's Mane - take 1g po daily    . Turmeric (CURCUMIN 95) 500 MG CAPS Take by mouth daily.     No current facility-administered medications for this visit.      ALLERGIES: Ampicillin and Penicillins       Family History  Problem Relation Age of Onset  . COPD Father        dec age 76/s-smoked  . Macular degeneration Father   . Diabetes Brother   . Other Brother        committed suicide age 66  . Macular degeneration Brother   . Hyperlipidemia Mother   . Diabetes Maternal Grandmother    . Heart attack Paternal Grandfather     Social History        Socioeconomic History  . Marital status: Married    Spouse name: [name]  . Number of children: 2  . Years of education: college  . 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 Financial resource strain: Not on file  . Food insecurity:    Worry: Not on file    Inability: Not on file  . Transportation needs:    Medical: Not on file    Non-medical: Not on file  Tobacco Use  . Smoking status: Never Smoker  . Smokeless tobacco: Never Used  Substance and Sexual Activity  . Alcohol use: Yes    Alcohol/week: 0.0 standard drinks    Comment: once a year drink  . Drug use: Never  . Sexual activity: Not Currently    Partners: Male    Birth control/protection: Surgical    Comment: TAH--ovaries remain  Lifestyle  . Physical activity:    Days per week: Not on file    Minutes per session: Not on file  . Stress: Not on file  Relationships  . Social connections:    Talks on phone: Not on file    Gets together: Not on file    Attends religious service: Not on file    Active member of club or organization: Not on file    Attends meetings of clubs or organizations: Not on file    Relationship status: Not on file  . Intimate partner violence:    Fear of current or ex partner: Not on file    Emotionally abused: Not on file    Physically abused: Not on file    Forced sexual activity: Not on file  Other Topics Concern  . Not on file  Social History Narrative   Lives with husband   Caffeine- coffee, 2 cups daily    Review of Systems  All other systems reviewed and are negative.   PHYSICAL EXAMINATION:    BP 122/70 (BP Location: Right Arm, Patient Position: Sitting, Cuff Size: Normal)   Pulse 68   Resp 16   Ht _0  (1.727 m)   Wt 128 lb (58.1 kg)   LMP 09/26/1995 (Within Months)    BMI 19.46 kg/m     General appearance: alert, cooperative and appears stated age   Lungs: clear to auscultation bilaterally Heart: regular rate and rhythm Abdomen: incisions intact. Abdomen is soft, non-tender, no masses,  no organomegaly   Pelvic: External genitalia:  no lesions.  SP incisions intact.               Urethra:  normal appearing urethra with no masses, tenderness or lesions              Bartholins and Skenes: normal                 Vagina: normal appearing vagina with normal color and discharge, no lesions. Suture lines present.  No inflammation.              Cervix:  absent                Bimanual Exam:  Uterus:  absent              Adnexa: no mass, fullness, tenderness            Sterile cath with betadine prep - 50 cc urine noted.  Urine dip - mod RBCs, otherwise negative.   Chaperone was present for exam.  ASSESSMENT  Urinary  Retention.  Microscopic hematuria.  I suspect this is from catheterizations.   PLAN  Urine micro and cx.  Options for care discussed - continued self cath or revision of midurethral sling.  To the OR tomorrow for revision of midurethral sling/cystoscopy.  Risks reviewed - bleeding, infection, damage to surrounding organs, reaction to anesthesia, lysis of sling and stress incontinence.  NPO after midnight.  Cipro IV tomorrow preop.    An After Visit Summary was printed and given to the patient.

## 2018-05-31 ENCOUNTER — Encounter (HOSPITAL_BASED_OUTPATIENT_CLINIC_OR_DEPARTMENT_OTHER): Payer: Self-pay | Admitting: Obstetrics and Gynecology

## 2018-05-31 ENCOUNTER — Ambulatory Visit (INDEPENDENT_AMBULATORY_CARE_PROVIDER_SITE_OTHER): Payer: 59 | Admitting: Obstetrics and Gynecology

## 2018-05-31 VITALS — BP 118/70 | HR 78 | Resp 14 | Wt 131.0 lb

## 2018-05-31 DIAGNOSIS — R339 Retention of urine, unspecified: Secondary | ICD-10-CM | POA: Diagnosis not present

## 2018-05-31 DIAGNOSIS — Z9889 Other specified postprocedural states: Secondary | ICD-10-CM

## 2018-05-31 NOTE — Progress Notes (Signed)
GYNECOLOGY  VISIT   HPI: 62 y.o.   Married  Caucasian  female   G2P2002 with Patient's last menstrual period was 09/26/1995 (within months).   here for post op TRANSVAGINAL TAPE (TVT)  Revision (N/A Vagina ) CYSTOSCOPY (N/A Bladder)   Unable to void in the recovery room yesterday after the revision of her sling.  Had about 500 cc in bladder when cath was placed.  She was uncomfortable with the full bladder.  Took ibuprofen 800 my every 8 hours during the night.   Eating ok.  No BM yet.  Some bleeding.     GYNECOLOGIC HISTORY: Patient's last menstrual period was 09/26/1995 (within months). Contraception:  Hysterectomy Menopausal hormone therapy:  none Last mammogram:   01/11/18 BIRADS 1 negative Last pap smear:   03/22/17 ASCUS:Neg HR HPV        OB History    Gravida  2   Para  2   Term  2   Preterm      AB      Living  2     SAB      TAB      Ectopic      Multiple      Live Births                 Patient Active Problem List   Diagnosis Date Noted  . Status post surgery 05/14/2018  . Cystocele, unspecified (CODE) 03/06/2018  . Vaginal granulation tissue 12/03/2017  . Urinary tract infection without hematuria   . Muscular fasciculation   . Other fatigue   . Abdominal pain   . FTT (failure to thrive) in adult 08/09/2017  . Counseling regarding advanced care planning and goals of care 08/03/2017  . Gastroesophageal reflux disease without esophagitis 01/17/2017  . S/P autologous bone marrow transplantation ([place]) 03/29/2016  . Near syncope 01/04/2016  . Peripheral edema 12/19/2015  . Hematuria 11/08/2015  . Rash 11/01/2015  . Multiple myeloma ([place]) 10/19/2015  . Metastatic multiple myeloma to bone ([place]) 10/19/2015  . Plasma cell dyscrasia 10/05/2015  . Anemia 09/29/2015  . Hypergammaglobulinemia 09/29/2015  . HYPOXEMIA 12/16/2007  . HYPERLIPIDEMIA 12/04/2007  . DYSPNEA 12/04/2007  . MALIGNANT NEOPLASM OF THYMUS 12/03/2007  . OBSTRUCTIVE SLEEP  APNEA 12/03/2007  . Restless leg syndrome 12/03/2007    Past Medical History:  Diagnosis Date  . Abnormal Pap smear of vagina 03/22/2017   ASCUS pap and negative HR HPV.  Patient is on immunosuppressive medication.   . Anemia   . AR (aortic regurgitation) 11/30/2017   trace noted on ECHO  . Atrial septal aneurysm per 11-30-17 echo   trivial pericardial effusion  . Cancer ([place]) 1999   squamous cell carcinoma of thymus  . DDD (degenerative disc disease), cervical   . DDD (degenerative disc disease), lumbosacral   . Facet degeneration of lumbar region   . Facet joint disease of cervical region   . Fatigue   . Fatty liver   . Fibroid    reason for Hysterectomy  . GERD (gastroesophageal reflux disease)   . Grade I diastolic dysfunction   . History of cystocele   . History of stress incontinence   . Hypergammaglobulinemia   . Hyperlipidemia   . Lung nodule   . Migraine    episode of aphasia worked up with echo by [facility] 11-23-17, has visual migraines  . MR (mitral regurgitation) 11/30/2017   trace noted on ECHO  . Multiple myeloma ([place]) 08/2015   metastatic  to bone  . Muscular fasciculation   . Nasal septal deviation   . Nasal turbinate hypertrophy   . Near syncope   . Peripheral edema   . Plasma cell dyscrasia   . PONV (postoperative nausea and vomiting)    thinks morphine caused nausea  . Postoperative urinary retention 05/14/2018  . Restless leg syndrome   . Sleep apnea    uses old oral allialnce for osa due to cpap intolerance  . TR (tricuspid regurgitation) 11/30/2017   trace noted on ECHO  . Urinary incontinence   . UTI (urinary tract infection)   . Wears glasses     Past Surgical History:  Procedure Laterality Date  . ABDOMINAL HYSTERECTOMY  1997   TAH--ovaries remain--Dr. Newton Pigg  . ANTERIOR AND POSTERIOR REPAIR N/A 05/14/2018   Procedure: ANTERIOR (CYSTOCELE) AND POSTERIOR REPAIR (RECTOCELE);  Surgeon: [name], MD;   [facility];  Service: Gynecology;  Laterality: N/A;  . BLADDER SUSPENSION N/A 05/14/2018   Procedure: TRANSVAGINAL TAPE (TVT) PROCEDURE exact midurethral sling;  Surgeon: [name], MD;  [facility];  Service: Gynecology;  Laterality: N/A;  . BONE MARROW TRANSPLANT  2017   done at [place]  . BREAST BIOPSY    . COLONOSCOPY    . CYSTOSCOPY N/A 05/14/2018   Procedure: CYSTOSCOPY;  Surgeon: [name], MD;  [facility];  Service: Gynecology;  Laterality: N/A;  . CYSTOSCOPY N/A 05/30/2018   Procedure: CYSTOSCOPY;  Surgeon: [name], MD;  Location: [facility];  Service: Gynecology;  Laterality: N/A;  . ROBOTIC ASSISTED LAPAROSCOPIC SACROCOLPOPEXY N/A 05/14/2018   Procedure: XI ROBOTIC ASSISTED LAPAROSCOPIC SACROCOLPOPEXY;  Surgeon: [name], MD;  [facility];  Service: Gynecology;  Laterality: N/A;  6 hours OR time. Need extended stay recovery bed.  Marland Kitchen SALPINGOOPHORECTOMY Bilateral 05/14/2018   Procedure: SALPINGO OOPHORECTOMY;  Surgeon: [name], MD;  [facility];  Service: Gynecology;  Laterality: Bilateral;  . THYMECTOMY  1999  . TRANSVAGINAL TAPE (TVT) REMOVAL N/A 05/30/2018   Procedure: TRANSVAGINAL TAPE (TVT)  Revision;  Surgeon: [name], MD;  Location: [facility];  Service: Gynecology;  Laterality: N/A;  . WISDOM TOOTH EXTRACTION      Current Outpatient Medications  Medication Sig Dispense Refill  . Calcium-Magnesium (CAL/MAG PO) Take 1 tablet by mouth 2 (two) times daily.    Marland Kitchen ibuprofen (ADVIL,MOTRIN) 800 MG tablet Take 1 tablet (800 mg total) by mouth every 8 (eight) hours as needed. 30 tablet 0  . NON FORMULARY Lion's Mane - take 1g po daily    . Turmeric (CURCUMIN 95) 500 MG CAPS Take by mouth daily.     No current facility-administered medications for this visit.      ALLERGIES: Ampicillin and Penicillins  Family History  Problem  Relation Age of Onset  . COPD Father        dec age 70/s-smoked  . Macular degeneration Father   . Diabetes Brother   . Other Brother        committed suicide age 59  . Macular degeneration Brother   . Hyperlipidemia Mother   . Diabetes Maternal Grandmother   . Heart attack Paternal Grandfather     Social History   Socioeconomic History  . Marital status: Married    Spouse name: [name]  . Number of children: 2  . Years of education: college  .  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 Financial resource strain: Not on file  . Food insecurity:    Worry: Not on file    Inability: Not on file  . Transportation needs:    Medical: Not on file    Non-medical: Not on file  Tobacco Use  . Smoking status: Never Smoker  . Smokeless tobacco: Never Used  Substance and Sexual Activity  . Alcohol use: Yes    Alcohol/week: 0.0 standard drinks    Comment: once a year drink  . Drug use: Never  . Sexual activity: Not Currently    Partners: Male    Birth control/protection: Surgical    Comment: TAH--ovaries remain  Lifestyle  . Physical activity:    Days per week: Not on file    Minutes per session: Not on file  . Stress: Not on file  Relationships  . Social connections:    Talks on phone: Not on file    Gets together: Not on file    Attends religious service: Not on file    Active member of club or organization: Not on file    Attends meetings of clubs or organizations: Not on file    Relationship status: Not on file  . Intimate partner violence:    Fear of current or ex partner: Not on file    Emotionally abused: Not on file    Physically abused: Not on file    Forced sexual activity: Not on file  Other Topics Concern  . Not on file  Social History Narrative   Lives with husband   Caffeine- coffee, 2 cups daily    Review of Systems  Constitutional: Negative.   HENT: Negative.   Eyes:  Negative.   Respiratory: Negative.   Cardiovascular: Negative.   Gastrointestinal:       Urinary retention  Endocrine: Negative.   Genitourinary: Negative.   Musculoskeletal: Negative.   Skin: Negative.   Allergic/Immunologic: Negative.   Neurological: Negative.   Hematological: Negative.   Psychiatric/Behavioral: Negative.   All other systems reviewed and are negative.   PHYSICAL EXAMINATION:    BP 118/70   Pulse 78   Resp 14   Wt 131 lb (59.4 kg)   LMP 09/26/1995 (Within Months)   BMI 19.92 kg/m     General appearance: alert, cooperative and appears stated age   Pelvic: External genitalia:  no lesions              Urethra:  normal appearing urethra with no masses, tenderness or lesions                               Bimanual Exam:  Uterus:  Absent.  Sutures intact.  No masses.               Adnexa: no mass, fullness, tenderness         Foley removed.   Chaperone was present for exam.  ASSESSMENT  Status post revision of midurethral sling for post op urinary retention.   PLAN  Will do self cath at home prn.  She does not want to stay in the office now to do her voiding trial.  FU next week.  She will call if she needs anything during the weekend.   An After Visit Summary was printed and given to the patient.

## 2018-05-31 NOTE — Anesthesia Postprocedure Evaluation (Signed)
Anesthesia Post Note  Patient: RHAYA COALE  Procedure(s) Performed: TRANSVAGINAL TAPE (TVT)  Revision (N/A Vagina ) CYSTOSCOPY (N/A Bladder)     Patient location during evaluation: PACU Anesthesia Type: General Level of consciousness: awake and alert Pain management: pain level controlled Vital Signs Assessment: post-procedure vital signs reviewed and stable Respiratory status: spontaneous breathing, nonlabored ventilation, respiratory function stable and patient connected to nasal cannula oxygen Cardiovascular status: blood pressure returned to baseline and stable Postop Assessment: no apparent nausea or vomiting Anesthetic complications: no    Last Vitals:  Vitals:   05/30/18 1115 05/30/18 1322  BP: 121/75 116/69  Pulse: 82 84  Resp: 15 15  Temp:  36.5 C  SpO2: 100% 100%    Last Pain:  Vitals:   05/30/18 1322  TempSrc: Oral  PainSc:                  Ryan P Ellender

## 2018-05-31 NOTE — Op Note (Signed)
OPERATIVE REPORT  PREOPERATIVE DIAGNOSES:  Urinary retention status post TVT Exact midurethral sling  POSTOPERATIVE DIAGNOSES:  Urinary retention status post TVT Exact midurethral sling  PROCEDURES:  Revision of midurethral sling, cystoscopy  SURGEON:  Lenard Galloway, M.D.  ASSISTANT:    None.  ANESTHESIA:  LMA  EBL:  25 cc  URINE OUTPUT:   200 cc  IV FLUIDS:   1200 cc.  COMPLICATIONS:  None.  INDICATIONS FOR THE PROCEDURE:  The patient is a 62 year old Gravida 2, 33 2 Caucasian female who presents with urinary retention following a robotic sacrocolpopexy with bilateral salpingo-oophrectomy, anterior and posterior colporrhaphy with TVT Exact midurethral sling and cystoscopy performed 05/14/18.  Surgery was performed without complication.  The patient has not been able to successfully void post op, and is currently doing self catheterization.   A plan is made to proceed with a revision of her midurethral sling and cystoscopy and possible transection of the sling.  Risks, benefits, and alternatives are reviewed with the patient, who wishes to proceed.  FINDINGS:  Examination under anesthesia revealed good support of the anterior vaginal wall, apical, and posterior vaginal walls.  No sling was exposed.  The uterus was absent, and no adnexal masses were appreciated.   Cystoscopy was performed at the beginning of the surgery and at the termination of the surgery. The bladder was visualized throughout 360 degrees and was normal.  There was no foreign body in the bladder or the urethra.  The ureters were noted to be patent bilaterally.  SPECIMENS:  None.  DESCRIPTION OF PROCEDURE:  The patient was reidentified in the preoperative hold area.  She received Ciprofloxacin 400 mg, IV, for antibiotic prophylaxis. She received TED hose and PAS stockings for DVT prophylaxis.  The patient was transferred to the operating room where she was placed in the dorsal lithotomy position with  Allen stirrups.  An LMA anesthesic was induced.  The patient's lower abdomen, vagina and perineum were then sterilely prepped and she was draped.  The cystoscope was used to drain the bladder.  Cystoscopy was performed, and the findings are as noted above.  A Foley catheter was then sterilely placed inside the bladder and left to gravity drainage throughout the procedure.  Allis clamps were used to grasp the vaginal mucosa on either side of the anterior superior vaginal suture line. The suture was identified and unwound with a hemostat, and it was held . Blunt dissection was used to identify the midurethral sling.  Right angle clamps were used to grasp the sling to the right and left of the urethral.  Gentle downward traction was on the right angle clamps used to achieve mobility of the sling.  This was able to release tension of the sling on the midurethra.  The Foley was removed, and cystoscopy was performed again.  The findings were as noted above.  Hemostasis was good.  The anterior vaginal wall was closed with a running locked suture of 2-0 Vicryl.  The foley catheter was removed.  This concluded the patient's surgery.  There were no complications.   The patient was awakened and escorted to the recovery room in stable condition.  There were no complications.  All needle, instrument, and sponge counts were correct.   Lenard Galloway, M.D.

## 2018-06-06 NOTE — Progress Notes (Signed)
GYNECOLOGY  VISIT   HPI: 62 y.o.   Married  Caucasian  female   G2P2002 with Patient's last menstrual period was 09/26/1995 (within months).   here for post op.    Voiding well now.  Cathed a couple of times the day after her last visit.  Not needing to do this now.   Bowel function is good.    Will have oral surgery done.  Expecting to take abx.   Had foot surgery in 2 weeks.   UC 4/05/29/18 - negative.   GYNECOLOGIC HISTORY: Patient's last menstrual period was 09/26/1995 (within months). Contraception:  Post menopause Menopausal hormone therapy:  none Last mammogram:  12/2017 Last pap smear:   02/2017         OB History    Gravida  2   Para  2   Term  2   Preterm      AB      Living  2     SAB      TAB      Ectopic      Multiple      Live Births                 Patient Active Problem List   Diagnosis Date Noted  . Status post surgery 05/14/2018  . Bunion 05/13/2018  . Cystocele, unspecified (CODE) 03/06/2018  . Vaginal granulation tissue 12/03/2017  . Urinary tract infection without hematuria   . Muscular fasciculation   . Other fatigue   . Abdominal pain   . FTT (failure to thrive) in adult 08/09/2017  . Counseling regarding advanced care planning and goals of care 08/03/2017  . Gastroesophageal reflux disease without esophagitis 01/17/2017  . S/P autologous bone marrow transplantation ([place]) 03/29/2016  . Near syncope 01/04/2016  . Peripheral edema 12/19/2015  . Hematuria 11/08/2015  . Rash 11/01/2015  . Multiple myeloma ([place]) 10/19/2015  . Metastatic multiple myeloma to bone ([place]) 10/19/2015  . Plasma cell dyscrasia 10/05/2015  . Anemia 09/29/2015  . Hypergammaglobulinemia 09/29/2015  . HYPOXEMIA 12/16/2007  . HYPERLIPIDEMIA 12/04/2007  . DYSPNEA 12/04/2007  . MALIGNANT NEOPLASM OF THYMUS 12/03/2007  . OBSTRUCTIVE SLEEP APNEA 12/03/2007  . Restless leg syndrome 12/03/2007    Past Medical History:  Diagnosis Date  . Abnormal  Pap smear of vagina 03/22/2017   ASCUS pap and negative HR HPV.  Patient is on immunosuppressive medication.   . Anemia   . AR (aortic regurgitation) 11/30/2017   trace noted on ECHO  . Atrial septal aneurysm per 11-30-17 echo   trivial pericardial effusion  . Cancer ([place]) 1999   squamous cell carcinoma of thymus  . DDD (degenerative disc disease), cervical   . DDD (degenerative disc disease), lumbosacral   . Facet degeneration of lumbar region   . Facet joint disease of cervical region   . Fatigue   . Fatty liver   . Fibroid    reason for Hysterectomy  . GERD (gastroesophageal reflux disease)   . Grade I diastolic dysfunction   . History of cystocele   . History of stress incontinence   . Hypergammaglobulinemia   . Hyperlipidemia   . Lung nodule   . Migraine    episode of aphasia worked up with echo by [facility] 11-23-17, has visual migraines  . MR (mitral regurgitation) 11/30/2017   trace noted on ECHO  . Multiple myeloma ([place]) 08/2015   metastatic to bone  . Muscular fasciculation   . Nasal septal deviation   .  Nasal turbinate hypertrophy   . Near syncope   . Peripheral edema   . Plasma cell dyscrasia   . PONV (postoperative nausea and vomiting)    thinks morphine caused nausea  . Postoperative urinary retention 05/14/2018  . Restless leg syndrome   . Sleep apnea    uses old oral allialnce for osa due to cpap intolerance  . TR (tricuspid regurgitation) 11/30/2017   trace noted on ECHO  . Urinary incontinence   . UTI (urinary tract infection)   . Wears glasses     Past Surgical History:  Procedure Laterality Date  . ABDOMINAL HYSTERECTOMY  1997   TAH--ovaries remain--Dr. Newton Pigg  . ANTERIOR AND POSTERIOR REPAIR N/A 05/14/2018   Procedure: ANTERIOR (CYSTOCELE) AND POSTERIOR REPAIR (RECTOCELE);  Surgeon: [name], MD;  Location: [facility];  Service: Gynecology;  Laterality: N/A;  . BLADDER SUSPENSION N/A 05/14/2018   Procedure:  TRANSVAGINAL TAPE (TVT) PROCEDURE exact midurethral sling;  Surgeon: [name], MD;  [facility]: [facility];  Service: Gynecology;  Laterality: N/A;  . BONE MARROW TRANSPLANT  2017   done at [place]  . BREAST BIOPSY    . COLONOSCOPY    . CYSTOSCOPY N/A 05/14/2018   Procedure: CYSTOSCOPY;  Surgeon: [name], MD;  [facility]: [facility];  Service: Gynecology;  Laterality: N/A;  . CYSTOSCOPY N/A 05/30/2018   Procedure: CYSTOSCOPY;  Surgeon: [name], MD;  Location: [facility];  Service: Gynecology;  Laterality: N/A;  . ROBOTIC ASSISTED LAPAROSCOPIC SACROCOLPOPEXY N/A 05/14/2018   Procedure: XI ROBOTIC ASSISTED LAPAROSCOPIC SACROCOLPOPEXY;  Surgeon: [name], MD;  Location: [facility];  Service: Gynecology;  Laterality: N/A;  6 hours OR time. Need extended stay recovery bed.  Marland Kitchen SALPINGOOPHORECTOMY Bilateral 05/14/2018   Procedure: SALPINGO OOPHORECTOMY;  Surgeon: [name], MD;  [facility]: [facility];  Service: Gynecology;  Laterality: Bilateral;  . THYMECTOMY  1999  . TRANSVAGINAL TAPE (TVT) REMOVAL N/A 05/30/2018   Procedure: TRANSVAGINAL TAPE (TVT)  Revision;  Surgeon: [name], MD;  Location: [facility];  Service: Gynecology;  Laterality: N/A;  . WISDOM TOOTH EXTRACTION      Current Outpatient Medications  Medication Sig Dispense Refill  . Calcium-Magnesium (CAL/MAG PO) Take 1 tablet by mouth 2 (two) times daily.    Marland Kitchen ibuprofen (ADVIL,MOTRIN) 800 MG tablet Take 1 tablet (800 mg total) by mouth every 8 (eight) hours as needed. 30 tablet 0  . NON FORMULARY Lion's Mane - take 1g po daily    . Turmeric (CURCUMIN 95) 500 MG CAPS Take by mouth daily.     No current facility-administered medications for this visit.      ALLERGIES: Ampicillin and Penicillins  Family History  Problem Relation Age of Onset  . COPD Father        dec age 10/s-smoked  . Macular degeneration Father   . Diabetes  Brother   . Other Brother        committed suicide age 21  . Macular degeneration Brother   . Hyperlipidemia Mother   . Diabetes Maternal Grandmother   . Heart attack Paternal Grandfather     Social History   Socioeconomic History  . Marital status: Married    Spouse name: [name]  . Number of children: 2  . Years of education: college  . 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 Financial resource strain: Not on file  . Food insecurity:    Worry: Not on file    Inability: Not on file  . Transportation needs:    Medical: Not on file    Non-medical: Not on file  Tobacco Use  . Smoking status: Never Smoker  . Smokeless tobacco: Never Used  Substance and Sexual Activity  . Alcohol use: Yes    Alcohol/week: 0.0 standard drinks    Comment: once a year drink  . Drug use: Never  . Sexual activity: Not Currently    Partners: Male    Birth control/protection: Surgical    Comment: TAH--ovaries remain  Lifestyle  . Physical activity:    Days per week: Not on file    Minutes per session: Not on file  . Stress: Not on file  Relationships  . Social connections:    Talks on phone: Not on file    Gets together: Not on file    Attends religious service: Not on file    Active member of club or organization: Not on file    Attends meetings of clubs or organizations: Not on file    Relationship status: Not on file  . Intimate partner violence:    Fear of current or ex partner: Not on file    Emotionally abused: Not on file    Physically abused: Not on file    Forced sexual activity: Not on file  Other Topics Concern  . Not on file  Social History Narrative   Lives with husband   Caffeine- coffee, 2 cups daily    Review of Systems  Constitutional: Negative.   HENT: Negative.   Eyes: Negative.   Respiratory: Negative.   Cardiovascular: Negative.   Gastrointestinal: Negative.   Endocrine:  Negative.   Genitourinary: Negative.   Musculoskeletal: Negative.   Skin: Negative.   Allergic/Immunologic: Negative.   Neurological: Negative.   Hematological: Negative.   Psychiatric/Behavioral: Negative.   All other systems reviewed and are negative.   PHYSICAL EXAMINATION:    BP 104/72   Ht _0  (1.727 m)   Wt 129 lb (58.5 kg)   LMP 09/26/1995 (Within Months)   BMI 19.61 kg/m     General appearance: alert, cooperative and appears stated age   Abdomen: incisions intact.  Abdomen is soft, non-tender, no masses,  no organomegaly   Pelvic: External genitalia:  no lesions              Urethra:  normal appearing urethra with no masses, tenderness or lesions                        Bimanual Exam:  Uterus:   Absent.  No agglutination of vaginal sutures.                                Adnexa:  No masses.                 Chaperone was present for exam.  ASSESSMENT  Voiding well status post midurethral sling revision.  PLAN  Continue decreased activity.  FU in 4 weeks prior to return to work.    An After Visit Summary was printed and given to the patient.

## 2018-06-07 ENCOUNTER — Encounter: Payer: Self-pay | Admitting: Obstetrics and Gynecology

## 2018-06-07 ENCOUNTER — Ambulatory Visit (INDEPENDENT_AMBULATORY_CARE_PROVIDER_SITE_OTHER): Payer: 59 | Admitting: Obstetrics and Gynecology

## 2018-06-07 VITALS — BP 104/72 | Ht 68.0 in | Wt 129.0 lb

## 2018-06-07 DIAGNOSIS — Z9889 Other specified postprocedural states: Secondary | ICD-10-CM

## 2018-06-24 ENCOUNTER — Ambulatory Visit: Payer: 59 | Admitting: Obstetrics and Gynecology

## 2018-07-05 ENCOUNTER — Ambulatory Visit (INDEPENDENT_AMBULATORY_CARE_PROVIDER_SITE_OTHER): Payer: 59 | Admitting: Obstetrics and Gynecology

## 2018-07-05 ENCOUNTER — Ambulatory Visit: Payer: 59 | Admitting: Obstetrics and Gynecology

## 2018-07-05 ENCOUNTER — Encounter: Payer: Self-pay | Admitting: Hematology

## 2018-07-05 ENCOUNTER — Encounter: Payer: Self-pay | Admitting: Obstetrics and Gynecology

## 2018-07-05 ENCOUNTER — Inpatient Hospital Stay: Payer: 59

## 2018-07-05 ENCOUNTER — Telehealth: Payer: Self-pay

## 2018-07-05 ENCOUNTER — Inpatient Hospital Stay: Payer: 59 | Attending: Hematology | Admitting: Hematology

## 2018-07-05 VITALS — BP 116/68 | HR 66 | Resp 16 | Ht 68.0 in | Wt 135.2 lb

## 2018-07-05 VITALS — BP 124/76 | HR 103 | Temp 98.0°F | Resp 18 | Ht 68.0 in | Wt 135.2 lb

## 2018-07-05 DIAGNOSIS — C9001 Multiple myeloma in remission: Secondary | ICD-10-CM | POA: Insufficient documentation

## 2018-07-05 DIAGNOSIS — Z79899 Other long term (current) drug therapy: Secondary | ICD-10-CM | POA: Diagnosis not present

## 2018-07-05 DIAGNOSIS — Z9889 Other specified postprocedural states: Secondary | ICD-10-CM

## 2018-07-05 DIAGNOSIS — Z7982 Long term (current) use of aspirin: Secondary | ICD-10-CM | POA: Diagnosis not present

## 2018-07-05 LAB — CMP (CANCER CENTER ONLY)
ALBUMIN: 3.8 g/dL (ref 3.5–5.0)
ALK PHOS: 88 U/L (ref 38–126)
ALT: 33 U/L (ref 0–44)
ANION GAP: 11 (ref 5–15)
AST: 20 U/L (ref 15–41)
BILIRUBIN TOTAL: 0.6 mg/dL (ref 0.3–1.2)
BUN: 18 mg/dL (ref 8–23)
CALCIUM: 10 mg/dL (ref 8.9–10.3)
CO2: 25 mmol/L (ref 22–32)
Chloride: 106 mmol/L (ref 98–111)
Creatinine: 0.89 mg/dL (ref 0.44–1.00)
GFR, Estimated: >60 mL/min (ref >60)
Glucose, Bld: 132 mg/dL — ABNORMAL HIGH (ref 70–99)
POTASSIUM: 3.7 mmol/L (ref 3.5–5.1)
Sodium: 142 mmol/L (ref 135–145)
TOTAL PROTEIN: 7.8 g/dL (ref 6.5–8.1)

## 2018-07-05 LAB — CBC WITH DIFFERENTIAL/PLATELET
ABS IMMATURE GRANULOCYTES: 0.02 10*3/uL (ref 0.00–0.07)
Basophils Absolute: 0 10*3/uL (ref 0.0–0.1)
Basophils Relative: 1 %
EOS ABS: 0.1 10*3/uL (ref 0.0–0.5)
Eosinophils Relative: 2 %
HCT: 38.8 % (ref 36.0–46.0)
Hemoglobin: 12.8 g/dL (ref 12.0–15.0)
IMMATURE GRANULOCYTES: 0 %
Lymphocytes Relative: 23 %
Lymphs Abs: 1.1 10*3/uL (ref 0.7–4.0)
MCH: 31.1 pg (ref 26.0–34.0)
MCHC: 33 g/dL (ref 30.0–36.0)
MCV: 94.2 fL (ref 80.0–100.0)
MONOS PCT: 6 %
Monocytes Absolute: 0.3 10*3/uL (ref 0.1–1.0)
NEUTROS ABS: 3.3 10*3/uL (ref 1.7–7.7)
NEUTROS PCT: 68 %
PLATELETS: 351 10*3/uL (ref 150–400)
RBC: 4.12 MIL/uL (ref 3.87–5.11)
RDW: 14.3 % (ref 11.5–15.5)
WBC: 4.8 10*3/uL (ref 4.0–10.5)
nRBC: 0 % (ref 0.0–0.2)

## 2018-07-05 MED ORDER — LENALIDOMIDE 10 MG PO CAPS: 10.0000 mg | ORAL_CAPSULE | Freq: Every day | ORAL | 2 refills | Status: DC

## 2018-07-05 MED ORDER — RIVAROXABAN 10 MG PO TABS: 10.0000 mg | ORAL_TABLET | Freq: Every day | ORAL | 1 refills | Status: DC

## 2018-07-05 NOTE — Progress Notes (Signed)
GYNECOLOGY  VISIT   HPI: 63 y.o.   Married  Caucasian  female   G2P2002 with Patient's last menstrual period was 09/26/1995 (within months).   here for   6 week post opt TRANSVAGINAL TAPE (TVT) Revision (N/A Vagina ) CYSTOSCOPY (N/A Bladder)   Voiding but needs to concentrate in order to pass her urine.  She states she is using her muscles to lift the pelvic floor in order to void.  No self cathing.  Voiding well.  No bleeding.  No pelvic pain.   Had right foot bunion surgery and is in a cast and using a scooter.  GYNECOLOGIC HISTORY: Patient's last menstrual period was 09/26/1995 (within months). Contraception:  Post Menopause  Menopausal hormone therapy:  none Last mammogram:  12/2017 Bi- rads 1 neg  Last pap smear:  03/22/17 ASCUS:Neg HR HPV         OB History    Gravida  2   Para  2   Term  2   Preterm      AB      Living  2     SAB      TAB      Ectopic      Multiple      Live Births                 Patient Active Problem List   Diagnosis Date Noted  . Status post surgery 05/14/2018  . Bunion 05/13/2018  . Cystocele, unspecified (CODE) 03/06/2018  . Vaginal granulation tissue 12/03/2017  . Urinary tract infection without hematuria   . Muscular fasciculation   . Other fatigue   . Abdominal pain   . FTT (failure to thrive) in adult 08/09/2017  . Counseling regarding advanced care planning and goals of care 08/03/2017  . Gastroesophageal reflux disease without esophagitis 01/17/2017  . S/P autologous bone marrow transplantation ([place]) 03/29/2016  . Near syncope 01/04/2016  . Peripheral edema 12/19/2015  . Hematuria 11/08/2015  . Rash 11/01/2015  . Multiple myeloma ([place]) 10/19/2015  . Metastatic multiple myeloma to bone ([place]) 10/19/2015  . Plasma cell dyscrasia 10/05/2015  . Anemia 09/29/2015  . Hypergammaglobulinemia 09/29/2015  . HYPOXEMIA 12/16/2007  . HYPERLIPIDEMIA 12/04/2007  . DYSPNEA 12/04/2007  . MALIGNANT NEOPLASM OF THYMUS  12/03/2007  . OBSTRUCTIVE SLEEP APNEA 12/03/2007  . Restless leg syndrome 12/03/2007    Past Medical History:  Diagnosis Date  . Abnormal Pap smear of vagina 03/22/2017   ASCUS pap and negative HR HPV.  Patient is on immunosuppressive medication.   . Anemia   . AR (aortic regurgitation) 11/30/2017   trace noted on ECHO  . Atrial septal aneurysm per 11-30-17 echo   trivial pericardial effusion  . Cancer ([place]) 1999   squamous cell carcinoma of thymus  . DDD (degenerative disc disease), cervical   . DDD (degenerative disc disease), lumbosacral   . Facet degeneration of lumbar region   . Facet joint disease of cervical region   . Fatigue   . Fatty liver   . Fibroid    reason for Hysterectomy  . GERD (gastroesophageal reflux disease)   . Grade I diastolic dysfunction   . History of cystocele   . History of stress incontinence   . Hypergammaglobulinemia   . Hyperlipidemia   . Lung nodule   . Migraine    episode of aphasia worked up with echo by [facility] 11-23-17, has visual migraines  . MR (mitral regurgitation) 11/30/2017   trace noted on  ECHO  . Multiple myeloma ([place]) 08/2015   metastatic to bone  . Muscular fasciculation   . Nasal septal deviation   . Nasal turbinate hypertrophy   . Near syncope   . Peripheral edema   . Plasma cell dyscrasia   . PONV (postoperative nausea and vomiting)    thinks morphine caused nausea  . Postoperative urinary retention 05/14/2018  . Restless leg syndrome   . Sleep apnea    uses old oral allialnce for osa due to cpap intolerance  . TR (tricuspid regurgitation) 11/30/2017   trace noted on ECHO  . Urinary incontinence   . UTI (urinary tract infection)   . Wears glasses     Past Surgical History:  Procedure Laterality Date  . ABDOMINAL HYSTERECTOMY  1997   TAH--ovaries remain--Dr. Newton Pigg  . ANTERIOR AND POSTERIOR REPAIR N/A 05/14/2018   Procedure: ANTERIOR (CYSTOCELE) AND POSTERIOR REPAIR (RECTOCELE);  Surgeon:  [name], MD;  [facility];  Service: Gynecology;  Laterality: N/A;  . BLADDER SUSPENSION N/A 05/14/2018   Procedure: TRANSVAGINAL TAPE (TVT) PROCEDURE exact midurethral sling;  Surgeon: [name], MD;  [facility];  Service: Gynecology;  Laterality: N/A;  . BONE MARROW TRANSPLANT  2017   done at [place]  . BREAST BIOPSY    . COLONOSCOPY    . CYSTOSCOPY N/A 05/14/2018   Procedure: CYSTOSCOPY;  Surgeon: [name], MD;  [facility];  Service: Gynecology;  Laterality: N/A;  . CYSTOSCOPY N/A 05/30/2018   Procedure: CYSTOSCOPY;  Surgeon: [name], MD;  Location: [facility];  Service: Gynecology;  Laterality: N/A;  . ROBOTIC ASSISTED LAPAROSCOPIC SACROCOLPOPEXY N/A 05/14/2018   Procedure: XI ROBOTIC ASSISTED LAPAROSCOPIC SACROCOLPOPEXY;  Surgeon: [name], MD;  [facility];  Service: Gynecology;  Laterality: N/A;  6 hours OR time. Need extended stay recovery bed.  Marland Kitchen SALPINGOOPHORECTOMY Bilateral 05/14/2018   Procedure: SALPINGO OOPHORECTOMY;  Surgeon: [name], MD;  [facility];  Service: Gynecology;  Laterality: Bilateral;  . THYMECTOMY  1999  . TRANSVAGINAL TAPE (TVT) REMOVAL N/A 05/30/2018   Procedure: TRANSVAGINAL TAPE (TVT)  Revision;  Surgeon: [name], MD;  Location: [facility];  Service: Gynecology;  Laterality: N/A;  . WISDOM TOOTH EXTRACTION      Current Outpatient Medications  Medication Sig Dispense Refill  . aspirin 81 MG tablet Take 81 mg by mouth daily.    . Calcium-Magnesium (CAL/MAG PO) Take 1 tablet by mouth 2 (two) times daily.    . NON FORMULARY Lion's Mane - take 1g po daily    . Turmeric (CURCUMIN 95) 500 MG CAPS Take by mouth daily.    Marland Kitchen lenalidomide (REVLIMID) 10 MG capsule Take 1 capsule (10 mg total) by mouth daily. Take 21 days on and 7 days off (Patient not taking: Reported on 07/05/2018) 21 capsule 2  .  rivaroxaban (XARELTO) 10 MG TABS tablet Take 1 tablet (10 mg total) by mouth daily. (Patient not taking: Reported on 07/05/2018) 30 tablet 1   No current facility-administered medications for this visit.      ALLERGIES: Ampicillin and Penicillins  Family History  Problem Relation Age of Onset  . COPD Father        dec age 34/s-smoked  . Macular degeneration Father   . Diabetes Brother   . Other Brother        committed  suicide age 6  . Macular degeneration Brother   . Hyperlipidemia Mother   . Diabetes Maternal Grandmother   . Heart attack Paternal Grandfather     Social History   Socioeconomic History  . Marital status: Married    Spouse name: [name]  . Number of children: 2  . Years of education: college  . 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 Financial resource strain: Not on file  . Food insecurity:    Worry: Not on file    Inability: Not on file  . Transportation needs:    Medical: Not on file    Non-medical: Not on file  Tobacco Use  . Smoking status: Never Smoker  . Smokeless tobacco: Never Used  Substance and Sexual Activity  . Alcohol use: Yes    Alcohol/week: 0.0 standard drinks    Comment: once a year drink  . Drug use: Never  . Sexual activity: Not Currently    Partners: Male    Birth control/protection: Surgical    Comment: TAH--ovaries remain  Lifestyle  . Physical activity:    Days per week: Not on file    Minutes per session: Not on file  . Stress: Not on file  Relationships  . Social connections:    Talks on phone: Not on file    Gets together: Not on file    Attends religious service: Not on file    Active member of club or organization: Not on file    Attends meetings of clubs or organizations: Not on file    Relationship status: Not on file  . Intimate partner violence:    Fear of current or ex partner: Not on file    Emotionally abused: Not on file     Physically abused: Not on file    Forced sexual activity: Not on file  Other Topics Concern  . Not on file  Social History Narrative   Lives with husband   Caffeine- coffee, 2 cups daily    Review of Systems  All other systems reviewed and are negative.   PHYSICAL EXAMINATION:    BP 116/68   Pulse 66   Resp 16   Ht 5' 8"  (1.727 m)   Wt 135 lb 3.2 oz (61.3 kg)   LMP 09/26/1995 (Within Months)   BMI 20.56 kg/m     General appearance: alert, cooperative and appears stated age   Pelvic: External genitalia:  no lesions              Urethra:  normal appearing urethra with no masses, tenderness or lesions              Bartholins and Skenes: normal                 Vagina:  Suture present on the anterior vaginal wall.  No mesh palpable but indentation where a Gortex suture is located.              Cervix:  Absent.                Bimanual Exam:  Uterus:   Absent.              Adnexa: no mass, fullness, tenderness          Chaperone was present for exam.  ASSESSMENT  Status pos trobotic sacrocolpopexy and TVT sling on 05/14/18 Status post sling revision 05/30/18.  PLAN  Start vaginal estrogen cream 1/2 gm pv at hs three times weekly, starting in one week.  No lifting over 10 pounds.  FU in 1 month.  Ok to return to work next week.  She will send a release to work form in for me to sign.   An After Visit Summary was printed and given to the patient.

## 2018-07-05 NOTE — Progress Notes (Signed)
HEMATOLOGY/ONCOLOGY CLINIC NOTE  Date of Service: 07/05/18  PCP: .Paula Contras, MD  CC:  F/u for continue mx of multiple myeloma  CHIEF COMPLAINTS/PURPOSE OF CONSULTATION:  Follow up for multiple myeloma  DIAGNOSIS  IgA lambda R-ISS stage II multiple myeloma with innumerable lytic lesions in the calvarium, lesion in T7 vertebra and multiple lesions in the pelvis.  No significant bone pain at this time. Diagnosed in January 2017.  Current treatment  Restarting on Revlimid 38m po daily 3 weeks on 1 week off (after treatment interruption)   Previous Treatment  Status post Vd x 1 cycle VRd x 6 cycles  HD Melphalan 2053mm2 on 03/22/2016 Autologous HSCT on 03/23/2016 [facility])  -Revlimid maintenance 1546mo daily    INTERVAL HISTORY:   [facility] here for management and evaluation of her multiple myeloma. The patient's last visit with [facility] [place] on 05/03/18. She is accompanied today by her husband. The pt reports that she is doing well overall.   In the interim the pt had a Uterine lift on 05/14/18 and a bunionectomy on 06/25/18, and held Revlimid during this time. She notes that her surgical incisions have healed well. She is returning to work in a few days. She will not be weight bearing on her right foot for 5 more weeks and is ambulating with a push scooter. The pt reports no concerns for bleeding at either surgical site. She will have current cast replaced in 3 weeks.   Lab results today (07/05/18) of CBC w/diff, CMP is as follows: all values are WNL except for Glucose at 132. 07/05/18 MMP and SFLC are pending   On review of systems, pt reports good energy levels, healed surgical incisions, stable weight, eating well, and denies concerns for bleeding, new bone pains, new back pains, abdominal pains, leg swelling, and any other symptoms.    REVIEW OF SYSTEMS:    A 10+ POINT REVIEW OF SYSTEMS WAS OBTAINED including neurology,  dermatology, psychiatry, cardiac, respiratory, lymph, extremities, GI, GU, Musculoskeletal, constitutional, breasts, reproductive, HEENT.  All pertinent positives are noted in the HPI.  All others are negative.    PHYSICAL EXAMINATION: NAD VS reviewed in [facility] no acute distress and comfortable SKIN: no acute rashes, no significant lesions EYES: conjunctiva are pink and non-injected, sclera anicteric OROPHARYNX: MMM, no exudates, no oropharyngeal erythema or ulceration NECK: supple, no JVD LYMPH:  no palpable lymphadenopathy in the cervical, axillary or inguinal regions LUNGS: clear to auscultation b/l with normal respiratory effort HEART: regular rate & rhythm ABDOMEN:  normoactive bowel sounds , non tender, not distended. No palpable hepatosplenomegaly.  Extremity: rt leg and foot is in ACE wraps with splint. PSYCH: alert & oriented x 3 with fluent speech NEURO: no focal motor/sensory deficits   MEDICAL HISTORY:   Past Medical History:  Diagnosis Date  . Abnormal Pap smear of vagina 03/22/2017   ASCUS pap and negative HR HPV.  Patient is on immunosuppressive medication.   . Anemia   . AR (aortic regurgitation) 11/30/2017   trace noted on ECHO  . Atrial septal aneurysm per 11-30-17 echo   trivial pericardial effusion  . [place]   squamous cell carcinoma of thymus  . DDD (degenerative disc disease), cervical   . DDD (degenerative disc disease), lumbosacral   . Facet degeneration of lumbar region   . Facet joint disease of cervical region   . Fatigue   . Fatty liver   .  Fibroid    reason for Hysterectomy  . GERD (gastroesophageal reflux disease)   . Grade I diastolic dysfunction   . History of cystocele   . History of stress incontinence   . Hypergammaglobulinemia   . Hyperlipidemia   . Lung nodule   . Migraine    episode of aphasia worked up with echo by [facility] cardiology 11-23-17, has visual migraines  . MR (mitral regurgitation) 11/30/2017     trace noted on ECHO  . Multiple myeloma ([facility]ula Johnson) 08/2015   metastatic to bone  . Muscular fasciculation   . Nasal septal deviation   . Nasal turbinate hypertrophy   . Near syncope   . Peripheral edema   . Plasma cell dyscrasia   . PONV (postoperative nausea and vomiting)    thinks morphine caused nausea  . Postoperative urinary retention 05/14/2018  . Restless leg syndrome   . Sleep apnea    uses old oral allialnce for osa due to cpap intolerance  . TR (tricuspid regurgitation) 11/30/2017   trace noted on ECHO  . Urinary incontinence   . UTI (urinary tract infection)   . Wears glasses     SURGICAL HISTORY: Past Surgical History:  Procedure Laterality Date  . ABDOMINAL HYSTERECTOMY  1997   TAH--ovaries remain--Paula Johnson  . ANTERIOR AND POSTERIOR REPAIR N/A 05/14/2018   Procedure: ANTERIOR (CYSTOCELE) AND POSTERIOR REPAIR (RECTOCELE);  Surgeon: Paula Cobbs, MD;  Location: [facility];  Service: Gynecology;  Laterality: N/A;  . BLADDER SUSPENSION N/A 05/14/2018   Procedure: TRANSVAGINAL TAPE (TVT) PROCEDURE exact midurethral sling;  Surgeon: Paula Cobbs, MD;  Location: [facility];  Service: Gynecology;  Laterality: N/A;  . BONE MARROW TRANSPLANT  2017   done at [facility]  . BREAST BIOPSY    . COLONOSCOPY    . CYSTOSCOPY N/A 05/14/2018   Procedure: CYSTOSCOPY;  Surgeon: Paula Cobbs, MD;  [facility]: [facility];  Service: Gynecology;  Laterality: N/A;  . CYSTOSCOPY N/A 05/30/2018   Procedure: CYSTOSCOPY;  Surgeon: Paula Cobbs, MD;  Location: [facility];  Service: Gynecology;  Laterality: N/A;  . ROBOTIC ASSISTED LAPAROSCOPIC SACROCOLPOPEXY N/A 05/14/2018   Procedure: XI ROBOTIC ASSISTED LAPAROSCOPIC SACROCOLPOPEXY;  Surgeon: Paula Cobbs, MD;  [facility];  Service: Gynecology;  Laterality: N/A;  6 hours OR time. Need extended stay recovery bed.  Marland Kitchen SALPINGOOPHORECTOMY Bilateral 05/14/2018   Procedure: SALPINGO  OOPHORECTOMY;  Surgeon: Paula Cobbs, MD;  [facility]: [facility];  Service: Gynecology;  Laterality: Bilateral;  . THYMECTOMY  1999  . TRANSVAGINAL TAPE (TVT) REMOVAL N/A 05/30/2018   Procedure: TRANSVAGINAL TAPE (TVT)  Revision;  Surgeon: Paula Cobbs, MD;  Location: [facility];  Service: Gynecology;  Laterality: N/A;  . WISDOM TOOTH EXTRACTION      SOCIAL HISTORY: Social History   Socioeconomic History  . Marital status: Married    Spouse name: [name]  . Number of children: 2  . Years of education: college  . 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 Financial resource strain: Not on file  . Food insecurity:    Worry: Not on file    Inability: Not on file  . Transportation needs:    Medical: Not on file    Non-medical: Not on file  Tobacco Use  . Smoking status: Never Smoker  .  Smokeless tobacco: Never Used  Substance and Sexual Activity  . Alcohol use: Yes    Alcohol/week: 0.0 standard drinks    Comment: once a year drink  . Drug use: Never  . Sexual activity: Not Currently    Partners: Male    Birth control/protection: Surgical    Comment: TAH--ovaries remain  Lifestyle  . Physical activity:    Days per week: Not on file    Minutes per session: Not on file  . Stress: Not on file  Relationships  . Social connections:    Talks on phone: Not on file    Gets together: Not on file    Attends religious service: Not on file    Active member of club or organization: Not on file    Attends meetings of clubs or organizations: Not on file    Relationship status: Not on file  . Intimate partner violence:    Fear of current or ex partner: Not on file    Emotionally abused: Not on file    Physically abused: Not on file    Forced sexual activity: Not on file  Other Topics Concern  . Not on file  Social History Narrative   Lives with husband   Caffeine-  coffee, 2 cups daily    FAMILY HISTORY: Family History  Problem Relation Age of Onset  . COPD Father        dec age 36/s-smoked  . Macular degeneration Father   . Diabetes Brother   . Other Brother        committed suicide age 32  . Macular degeneration Brother   . Hyperlipidemia Mother   . Diabetes Maternal Grandmother   . Heart attack Paternal Grandfather     ALLERGIES:  is allergic to ampicillin and penicillins.  MEDICATIONS:  . Current Outpatient Medications:  .  aspirin 81 MG tablet, Take 81 mg by mouth daily., Disp: , Rfl:  .  Calcium-Magnesium (CAL/MAG PO), Take 1 tablet by mouth 2 (two) times daily., Disp: , Rfl:  .  NON FORMULARY, Lion's Mane - take 1g po daily, Disp: , Rfl:  .  Turmeric (CURCUMIN 95) 500 MG CAPS, Take by mouth daily., Disp: , Rfl:  .  lenalidomide (REVLIMID) 10 MG capsule, Take 1 capsule (10 mg total) by mouth daily. Take 21 days on and 7 days off, Disp: 21 capsule, Rfl: 2 .  rivaroxaban (XARELTO) 10 MG TABS tablet, Take 1 tablet (10 mg total) by mouth daily., Disp: 30 tablet, Rfl: 1   LABORATORY DATA:  I have reviewed the data as listed  . CBC Latest Ref Rng & Units 07/05/2018 05/30/2018 05/15/2018  WBC 4.0 - 10.5 K/uL 4.8 5.8 5.0  Hemoglobin 12.0 - 15.0 g/dL 12.8 12.2 10.3(L)  Hematocrit 36.0 - 46.0 % 38.8 37.6 31.1(L)  Platelets 150 - 400 K/uL 351 451(H) 243    . CMP Latest Ref Rng & Units 07/05/2018 05/30/2018 05/15/2018  Glucose 70 - 99 mg/dL 132(H) 102(H) 96  BUN 8 - 23 mg/dL 18 19 12   Creatinine 0.44 - 1.00 mg/dL 0.89 0.70 0.75  Sodium 135 - 145 mmol/L 142 141 140  Potassium 3.5 - 5.1 mmol/L 3.7 4.4 4.3  Chloride 98 - 111 mmol/L 106 106 109  CO2 22 - 32 mmol/L 25 27 26   Calcium 8.9 - 10.3 mg/dL 10.0 9.0 7.6(L)  Total Protein 6.5 - 8.1 g/dL 7.8 - -  Total Bilirubin 0.3 - 1.2 mg/dL 0.6 - -  Alkaline Phos 38 -  126 U/L 88 - -  AST 15 - 41 U/L 20 - -  ALT 0 - 44 U/L 33 - -     RADIOGRAPHIC STUDIES: I have personally reviewed the  radiological images as listed and agreed with the findings in the report. No results found.  ASSESSMENT & PLAN:   62 y.o. caucasian female with  1. H/o Multiple myeloma - currently in remission.  IgA lambda R-ISS stage II multiple myeloma with innumerable lytic lesions in the calvarium, lesion in T7 vertebra and multiple lesions in the pelvis.  No significant bone pain at this time. Diagnosed in January 2017. s/p treatment as noted above 2.Pre-op assessment for myeloma  PLAN:  -Held Revlimid from 04/30/18 through her 05/14/18 uterine lift procedure and the bunionectomy on 06/25/18 to decrease risk of clots -Discussed pt labwork today, 07/05/18; blood counts and chemistries are stable  -07/05/18 MMP and SFLC are pending  -Discussed the risk vs benefit of returning to Revlimid now vs after she can weight bear on her post surgical foot in 6 weeks, given risk of additional blood clots with recent surgery and immobility of foot -Pt prefers to return to Revlimid now, and I will start her on 10m Xarelto temporarily for VTE prevention -Discussed option to continue with Zometa, and will continue for now  -Will see the pt back in 2 months     Continue Zometa q8weeks RTC with Dr KIrene Limboin 8 weeks with labs     The total time spent in the appt was 30 minutes and more than 50% was on counseling and direct patient cares.   GSullivan LoneMD Paula AAHIVMS SMadison Medical CenterCThe Endoscopy Center Of FairfieldHematology/Oncology Physician CNovant Health [facility] (Office):       [phone](Work cell):  [phone](Fax):           [phone] I, SBaldwin Jamaica am acting as a scribe for Dr. KIrene Limbo .I have reviewed the above documentation for accuracy and completeness, and I agree with the above. .Brunetta GeneraMD

## 2018-07-05 NOTE — Telephone Encounter (Signed)
Left a msg concerning her upcoming appointments. Per 10/11 los. Will mail a letter with a calender enclosed

## 2018-07-08 LAB — KAPPA/LAMBDA LIGHT CHAINS
KAPPA, LAMDA LIGHT CHAIN RATIO: 1 (ref 0.26–1.65)
Kappa free light chain: 21.7 mg/L — ABNORMAL HIGH (ref 3.3–19.4)
LAMDA FREE LIGHT CHAINS: 21.8 mg/L (ref 5.7–26.3)

## 2018-07-09 LAB — MULTIPLE MYELOMA PANEL, SERUM
ALBUMIN SERPL ELPH-MCNC: 3.9 g/dL (ref 2.9–4.4)
Albumin/Glob SerPl: 1.3 (ref 0.7–1.7)
Alpha 1: 0.2 g/dL (ref 0.0–0.4)
Alpha2 Glob SerPl Elph-Mcnc: 0.8 g/dL (ref 0.4–1.0)
B-Globulin SerPl Elph-Mcnc: 1.3 g/dL (ref 0.7–1.3)
Gamma Glob SerPl Elph-Mcnc: 0.8 g/dL (ref 0.4–1.8)
Globulin, Total: 3.2 g/dL (ref 2.2–3.9)
IGA: 378 mg/dL — AB (ref 87–352)
IGM (IMMUNOGLOBULIN M), SRM: 34 mg/dL (ref 26–217)
IgG (Immunoglobin G), Serum: 1108 mg/dL (ref 700–1600)
TOTAL PROTEIN ELP: 7.1 g/dL (ref 6.0–8.5)

## 2018-07-15 DIAGNOSIS — M7741 Metatarsalgia, right foot: Secondary | ICD-10-CM

## 2018-07-15 HISTORY — DX: Metatarsalgia, right foot: M77.41

## 2018-08-01 ENCOUNTER — Other Ambulatory Visit: Payer: Self-pay | Admitting: Hematology

## 2018-08-02 ENCOUNTER — Other Ambulatory Visit: Payer: Self-pay | Admitting: *Deleted

## 2018-08-02 ENCOUNTER — Telehealth: Payer: Self-pay | Admitting: *Deleted

## 2018-08-02 MED ORDER — LENALIDOMIDE 10 MG PO CAPS: 10.0000 mg | ORAL_CAPSULE | Freq: Every day | ORAL | 0 refills | Status: DC

## 2018-08-02 NOTE — Telephone Encounter (Signed)
Received refill request for Revlimid. Contacted patient, left VM to confirm she was currently taking. Last noted 07/05/18 that patient was to resume. Patient called back, reports she has resumed taking med and needs refill.

## 2018-08-05 ENCOUNTER — Other Ambulatory Visit: Payer: Self-pay

## 2018-08-05 ENCOUNTER — Ambulatory Visit (INDEPENDENT_AMBULATORY_CARE_PROVIDER_SITE_OTHER): Payer: 59 | Admitting: Obstetrics and Gynecology

## 2018-08-05 ENCOUNTER — Encounter: Payer: Self-pay | Admitting: Obstetrics and Gynecology

## 2018-08-05 VITALS — BP 108/60 | HR 76 | Ht 68.0 in | Wt 135.0 lb

## 2018-08-05 DIAGNOSIS — Z9889 Other specified postprocedural states: Secondary | ICD-10-CM

## 2018-08-05 NOTE — Progress Notes (Signed)
GYNECOLOGY  VISIT   HPI: 62 y.o.   Married  Caucasian  female   G2P2002 with Patient's last menstrual period was 09/26/1995 (within months).   here for 10 week follow up TRANSVAGINAL TAPE (TVT) Revision (N/A Vagina ) CYSTOSCOPY (N/A Bladder).   Likes not having to deal with prolapse.  Good bladder control.  Still needs to tightening of her muscles to void.  Good bowel function.   Not using estrace cream.   Using her scooter during her recovery from her foot surgery.  Will go into a boot soon.   GYNECOLOGIC HISTORY: Patient's last menstrual period was 09/26/1995 (within months). Contraception:  Postmenopausal Menopausal hormone therapy:  none Last mammogram:   12/2017 Bi- rads 1 neg  Last pap smear:  03/22/17 ASCUS:Neg HR HPV         OB History    Gravida  2   Para  2   Term  2   Preterm      AB      Living  2     SAB      TAB      Ectopic      Multiple      Live Births                 Patient Active Problem List   Diagnosis Date Noted  . Status post surgery 05/14/2018  . Bunion 05/13/2018  . Cystocele, unspecified (CODE) 03/06/2018  . Vaginal granulation tissue 12/03/2017  . Urinary tract infection without hematuria   . Muscular fasciculation   . Other fatigue   . Abdominal pain   . FTT (failure to thrive) in adult 08/09/2017  . Counseling regarding advanced care planning and goals of care 08/03/2017  . Gastroesophageal reflux disease without esophagitis 01/17/2017  . S/P autologous bone marrow transplantation ([place]) 03/29/2016  . Near syncope 01/04/2016  . Peripheral edema 12/19/2015  . Hematuria 11/08/2015  . Rash 11/01/2015  . Multiple myeloma ([place]) 10/19/2015  . Metastatic multiple myeloma to bone ([place]) 10/19/2015  . Plasma cell dyscrasia 10/05/2015  . Anemia 09/29/2015  . Hypergammaglobulinemia 09/29/2015  . HYPOXEMIA 12/16/2007  . HYPERLIPIDEMIA 12/04/2007  . DYSPNEA 12/04/2007  . MALIGNANT NEOPLASM OF THYMUS 12/03/2007  .  OBSTRUCTIVE SLEEP APNEA 12/03/2007  . Restless leg syndrome 12/03/2007    Past Medical History:  Diagnosis Date  . Abnormal Pap smear of vagina 03/22/2017   ASCUS pap and negative HR HPV.  Patient is on immunosuppressive medication.   . Anemia   . AR (aortic regurgitation) 11/30/2017   trace noted on ECHO  . Atrial septal aneurysm per 11-30-17 echo   trivial pericardial effusion  . Cancer ([place]) 1999   squamous cell carcinoma of thymus  . DDD (degenerative disc disease), cervical   . DDD (degenerative disc disease), lumbosacral   . Facet degeneration of lumbar region   . Facet joint disease of cervical region   . Fatigue   . Fatty liver   . Fibroid    reason for Hysterectomy  . GERD (gastroesophageal reflux disease)   . Grade I diastolic dysfunction   . History of cystocele   . History of stress incontinence   . Hypergammaglobulinemia   . Hyperlipidemia   . Lung nodule   . Migraine    episode of aphasia worked up with echo by [facility] 11-23-17, has visual migraines  . MR (mitral regurgitation) 11/30/2017   trace noted on ECHO  . Multiple myeloma ([place]) 08/2015   metastatic  to bone  . Muscular fasciculation   . Nasal septal deviation   . Nasal turbinate hypertrophy   . Near syncope   . Peripheral edema   . Plasma cell dyscrasia   . PONV (postoperative nausea and vomiting)    thinks morphine caused nausea  . Postoperative urinary retention 05/14/2018  . Restless leg syndrome   . Sleep apnea    uses old oral allialnce for osa due to cpap intolerance  . TR (tricuspid regurgitation) 11/30/2017   trace noted on ECHO  . Urinary incontinence   . UTI (urinary tract infection)   . Wears glasses     Past Surgical History:  Procedure Laterality Date  . ABDOMINAL HYSTERECTOMY  1997   TAH--ovaries remain--Dr. Newton Pigg  . ANTERIOR AND POSTERIOR REPAIR N/A 05/14/2018   Procedure: ANTERIOR (CYSTOCELE) AND POSTERIOR REPAIR (RECTOCELE);  Surgeon: [name], MD;  [facility];  Service: Gynecology;  Laterality: N/A;  . BLADDER SUSPENSION N/A 05/14/2018   Procedure: TRANSVAGINAL TAPE (TVT) PROCEDURE exact midurethral sling;  Surgeon: [name], MD;  [facility];  Service: Gynecology;  Laterality: N/A;  . BONE MARROW TRANSPLANT  2017   done at [place]  . BREAST BIOPSY    . COLONOSCOPY    . CYSTOSCOPY N/A 05/14/2018   Procedure: CYSTOSCOPY;  Surgeon: [name], MD;  [facility];  Service: Gynecology;  Laterality: N/A;  . CYSTOSCOPY N/A 05/30/2018   Procedure: CYSTOSCOPY;  Surgeon: [name], MD;  Location[facility];  Service: Gynecology;  Laterality: N/A;  . ROBOTIC ASSISTED LAPAROSCOPIC SACROCOLPOPEXY N/A 05/14/2018   Procedure: XI ROBOTIC ASSISTED LAPAROSCOPIC SACROCOLPOPEXY;  Surgeon: [name], MD;  [facility];  Service: Gynecology;  Laterality: N/A;  6 hours OR time. Need extended stay recovery bed.  Marland Kitchen SALPINGOOPHORECTOMY Bilateral 05/14/2018   Procedure: SALPINGO OOPHORECTOMY;  Surgeon: [name], MD;  [facility];  Service: Gynecology;  Laterality: Bilateral;  . THYMECTOMY  1999  . TRANSVAGINAL TAPE (TVT) REMOVAL N/A 05/30/2018   Procedure: TRANSVAGINAL TAPE (TVT)  Revision;  Surgeon: [name], MD;  Location: [facility];  Service: Gynecology;  Laterality: N/A;  . WISDOM TOOTH EXTRACTION      Current Outpatient Medications  Medication Sig Dispense Refill  . Calcium-Magnesium (CAL/MAG PO) Take 1 tablet by mouth 2 (two) times daily.    Marland Kitchen estradiol (ESTRACE VAGINAL) 0.1 MG/GM vaginal cream Estrace 0.01% (0.1 mg/gram) vaginal cream    . lenalidomide (REVLIMID) 10 MG capsule Take 1 capsule (10 mg total) by mouth daily. Take 21 days on and 7 days off. Auth# [id number] 08/02/18 21 capsule 0  . NON FORMULARY Lion's Mane - take 1g po daily    . rivaroxaban (XARELTO) 10 MG TABS tablet Take 1 tablet (10  mg total) by mouth daily. 30 tablet 1   No current facility-administered medications for this visit.      ALLERGIES: Ampicillin and Penicillins  Family History  Problem Relation Age of Onset  . COPD Father        dec age 35/s-smoked  . Macular degeneration Father   . Diabetes Brother   . Other Brother        committed suicide age 81  . Macular degeneration Brother   . Hyperlipidemia Mother   . Diabetes Maternal Grandmother   . Heart attack Paternal Grandfather  Social History   Socioeconomic History  . Marital status: Married    Spouse name: [name]  . Number of children: 2  . Years of education: college  . 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 Financial resource strain: Not on file  . Food insecurity:    Worry: Not on file    Inability: Not on file  . Transportation needs:    Medical: Not on file    Non-medical: Not on file  Tobacco Use  . Smoking status: Never Smoker  . Smokeless tobacco: Never Used  Substance and Sexual Activity  . Alcohol use: Yes    Alcohol/week: 0.0 standard drinks    Comment: once a year drink  . Drug use: Never  . Sexual activity: Not Currently    Partners: Male    Birth control/protection: Surgical    Comment: TAH--ovaries remain  Lifestyle  . Physical activity:    Days per week: Not on file    Minutes per session: Not on file  . Stress: Not on file  Relationships  . Social connections:    Talks on phone: Not on file    Gets together: Not on file    Attends religious service: Not on file    Active member of club or organization: Not on file    Attends meetings of clubs or organizations: Not on file    Relationship status: Not on file  . Intimate partner violence:    Fear of current or ex partner: Not on file    Emotionally abused: Not on file    Physically abused: Not on file    Forced sexual activity: Not on file  Other Topics Concern  .  Not on file  Social History Narrative   Lives with husband   Caffeine- coffee, 2 cups daily    Review of Systems  All other systems reviewed and are negative.   PHYSICAL EXAMINATION:    BP 108/60 (BP Location: Left Arm, Patient Position: Standing, Cuff Size: Normal)   Pulse 76   Ht _0  (1.727 m)   Wt 135 lb (61.2 kg)   LMP 09/26/1995 (Within Months)   BMI 20.53 kg/m     General appearance: alert, cooperative and appears stated age  Pelvic: External genitalia:  no lesions              Urethra:  normal appearing urethra with no masses, tenderness or lesions              Bartholins and Skenes: normal                 Vagina: normal appearing vagina with normal color and discharge, no lesions              Cervix:  Absent.  Mesh for sacrocolpopexy noted.  No erosion or exposure of mesh at vagina apex or under urethra.  Good support.                 Bimanual Exam:  Uterus:  absent              Adnexa: no mass, fullness, tenderness              Rectal exam: Yes.  .  Confirms.              Anus:  normal sphincter tone, no lesions  Chaperone was present for exam.  ASSESSMENT  Doing well post op.   PLAN  Ok to resume normal activities in 2 weeks.  We talked about trying to relax her muscles for voiding so that it is a passive process.  Use Premarin cream 1/2 gm pv 3 times per week at hs.  FU yearly.    An After Visit Summary was printed and given to the patient.

## 2018-08-14 ENCOUNTER — Encounter: Payer: Self-pay | Admitting: Hematology

## 2018-08-16 ENCOUNTER — Telehealth: Payer: Self-pay | Admitting: *Deleted

## 2018-08-16 MED ORDER — RIVAROXABAN 10 MG PO TABS: 10.0000 mg | ORAL_TABLET | Freq: Every day | ORAL | 0 refills | Status: DC

## 2018-08-16 NOTE — Telephone Encounter (Signed)
Contacted patient in response to my chart message [id number] and left VM on named VM: per [name] continue Xarelto for one more month, refill will be sent to pharmacy. After Xarelto is completed, resume baby aspirin (81 mg). For questions or concerns, please contact office.

## 2018-08-26 ENCOUNTER — Other Ambulatory Visit: Payer: Self-pay | Admitting: Hematology

## 2018-08-26 ENCOUNTER — Other Ambulatory Visit: Payer: Self-pay | Admitting: *Deleted

## 2018-08-26 MED ORDER — LENALIDOMIDE 10 MG PO CAPS: ORAL_CAPSULE | ORAL | 0 refills | Status: DC

## 2018-08-29 NOTE — Progress Notes (Signed)
HEMATOLOGY/ONCOLOGY CLINIC NOTE  Date of Service: 08/30/18  PCP: .[name], MD  CC:  F/u for continue mx of multiple myeloma  CHIEF COMPLAINTS/PURPOSE OF CONSULTATION:  Follow up for multiple myeloma  DIAGNOSIS  IgA lambda R-ISS stage II multiple myeloma with innumerable lytic lesions in the calvarium, lesion in T7 vertebra and multiple lesions in the pelvis.  No significant bone pain at this time. Diagnosed in January 2017.  Current treatment  Restarting on Revlimid 9m po daily 3 weeks on 1 week off (after treatment interruption)   Previous Treatment  Status post Vd x 1 cycle VRd x 6 cycles  HD Melphalan 2085mm2 on 03/22/2016 Autologous HSCT on 03/23/2016 [facility] 6.4 x10^6/kg)  -Revlimid maintenance 1558mo daily    INTERVAL HISTORY:   [name] here for management and evaluation of her multiple myeloma. The patient's last visit with us Kore[place] on 1011/19. The pt reports that she is doing well overall.   The pt reports that she will be transitioning out of her post-surgical boot on the right foot, next week. She denies any redness associated, and denies calf pain. She notes that there is remaining swelling but no pain. The patient has continued on 49m43mrelto during this time, and also has continued on 49mg21mlimid without complaint.   The patient notes that [facility]   Lab results today (08/30/18) of CBC w/diff and CMP is as follows: all values are WNL except for WBC at 3.0k, RBC at 3.83, HGB at 11.8, HCT at 35.9, ANC at 1.6k. 08/30/18 MMP and SFLC are pending   On review of systems, pt reports right ankle/foot swelling, eating well, good energy levels, moving her bowels well, and denies calf pain, calf redness, red coloration of feet, mouth sores, bone pains, abdominal pains, and any other symptoms.   REVIEW OF SYSTEMS:    A 10+ POINT REVIEW OF SYSTEMS WAS OBTAINED including  neurology, dermatology, psychiatry, cardiac, respiratory, lymph, extremities, GI, GU, Musculoskeletal, constitutional, breasts, reproductive, HEENT.  All pertinent positives are noted in the HPI.  All others are negative.   PHYSICAL EXAMINATION: NAD VS reviewed in Epic [place] acute distress and comfortable SKIN: no acute rashes, no significant lesions EYES: conjunctiva are pink and non-injected, sclera anicteric OROPHARYNX: MMM, no exudates, no oropharyngeal erythema or ulceration NECK: supple, no JVD LYMPH:  no palpable lymphadenopathy in the cervical, axillary or inguinal regions LUNGS: clear to auscultation b/l with normal respiratory effort HEART: regular rate & rhythm ABDOMEN:  normoactive bowel sounds , non tender, not distended. No palpable hepatosplenomegaly.  Extremity: no pedal edema PSYCH: alert & oriented x 3 with fluent speech NEURO: no focal motor/sensory deficits   MEDICAL HISTORY:   Past Medical History:  Diagnosis Date  . Abnormal Pap smear of vagina 03/22/2017   ASCUS pap and negative HR HPV.  Patient is on immunosuppressive medication.   . Anemia   . AR (aortic regurgitation) 11/30/2017   trace noted on ECHO  . Atrial septal aneurysm per 11-30-17 echo   trivial pericardial effusion  . Cancer (HCC) [place]9   squamous cell carcinoma of thymus  . DDD (degenerative disc disease), cervical   . DDD (degenerative disc disease), lumbosacral   . Facet degeneration of lumbar region   . Facet joint disease of cervical region   . Fatigue   . Fatty liver   . Fibroid  reason for Hysterectomy  . GERD (gastroesophageal reflux disease)   . Grade I diastolic dysfunction   . History of cystocele   . History of stress incontinence   . Hypergammaglobulinemia   . Hyperlipidemia   . Lung nodule   . Migraine    episode of aphasia worked up with echo by [place] [facility] 11-23-17, has visual migraines  . MR (mitral regurgitation) 11/30/2017   trace noted on ECHO   . Multiple myeloma ([place]) 08/2015   metastatic to bone  . Muscular fasciculation   . Nasal septal deviation   . Nasal turbinate hypertrophy   . Near syncope   . Peripheral edema   . Plasma cell dyscrasia   . PONV (postoperative nausea and vomiting)    thinks morphine caused nausea  . Postoperative urinary retention 05/14/2018  . Restless leg syndrome   . Sleep apnea    uses old oral allialnce for osa due to cpap intolerance  . TR (tricuspid regurgitation) 11/30/2017   trace noted on ECHO  . Urinary incontinence   . UTI (urinary tract infection)   . Wears glasses     SURGICAL HISTORY: Past Surgical History:  Procedure Laterality Date  . ABDOMINAL HYSTERECTOMY  1997   TAH--ovaries remain--Dr. Newton Pigg  . ANTERIOR AND POSTERIOR REPAIR N/A 05/14/2018   Procedure: ANTERIOR (CYSTOCELE) AND POSTERIOR REPAIR (RECTOCELE);  Surgeon: [name], MD;  Location: [facility];  Service: Gynecology;  Laterality: N/A;  . BLADDER SUSPENSION N/A 05/14/2018   Procedure: TRANSVAGINAL TAPE (TVT) PROCEDURE exact midurethral sling;  Surgeon: [name], MD;  Location: [facility];  Service: Gynecology;  Laterality: N/A;  . BONE MARROW TRANSPLANT  2017   done at [place]  . BREAST BIOPSY    . COLONOSCOPY    . CYSTOSCOPY N/A 05/14/2018   Procedure: CYSTOSCOPY;  Surgeon: [name], MD;  Location: [facility];  Service: Gynecology;  Laterality: N/A;  . CYSTOSCOPY N/A 05/30/2018   Procedure: CYSTOSCOPY;  Surgeon: [name], MD;  Location: [facility];  Service: Gynecology;  Laterality: N/A;  . ROBOTIC ASSISTED LAPAROSCOPIC SACROCOLPOPEXY N/A 05/14/2018   Procedure: XI ROBOTIC ASSISTED LAPAROSCOPIC SACROCOLPOPEXY;  Surgeon: [name], MD;  Location: [facility];  Service: Gynecology;  Laterality: N/A;  6 hours OR time. Need extended stay recovery bed.  Marland Kitchen SALPINGOOPHORECTOMY Bilateral 05/14/2018   Procedure: SALPINGO OOPHORECTOMY;   Surgeon: [name], MD;  Location: [facility];  Service: Gynecology;  Laterality: Bilateral;  . THYMECTOMY  1999  . TRANSVAGINAL TAPE (TVT) REMOVAL N/A 05/30/2018   Procedure: TRANSVAGINAL TAPE (TVT)  Revision;  Surgeon: [name], MD;  Location[facility];  Service: Gynecology;  Laterality: N/A;  . WISDOM TOOTH EXTRACTION      SOCIAL HISTORY: Social History   Socioeconomic History  . Marital status: Married    Spouse name: [name]  . Number of children: 2  . Years of education: college  . 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 Financial resource strain: Not on file  . Food insecurity:    Worry: Not on file    Inability: Not on file  . Transportation needs:    Medical: Not on file    Non-medical: Not on file  Tobacco Use  . Smoking status: Never Smoker  . Smokeless tobacco: Never  Used  Substance and Sexual Activity  . Alcohol use: Yes    Alcohol/week: 0.0 standard drinks    Comment: once a year drink  . Drug use: Never  . Sexual activity: Not Currently    Partners: Male    Birth control/protection: Surgical    Comment: TAH--ovaries remain  Lifestyle  . Physical activity:    Days per week: Not on file    Minutes per session: Not on file  . Stress: Not on file  Relationships  . Social connections:    Talks on phone: Not on file    Gets together: Not on file    Attends religious service: Not on file    Active member of club or organization: Not on file    Attends meetings of clubs or organizations: Not on file    Relationship status: Not on file  . Intimate partner violence:    Fear of current or ex partner: Not on file    Emotionally abused: Not on file    Physically abused: Not on file    Forced sexual activity: Not on file  Other Topics Concern  . Not on file  Social History Narrative   Lives with husband   Caffeine- coffee, 2 cups  daily    FAMILY HISTORY: Family History  Problem Relation Age of Onset  . COPD Father        dec age 65/s-smoked  . Macular degeneration Father   . Diabetes Brother   . Other Brother        committed suicide age 58  . Macular degeneration Brother   . Hyperlipidemia Mother   . Diabetes Maternal Grandmother   . Heart attack Paternal Grandfather     ALLERGIES:  is allergic to ampicillin and penicillins.  MEDICATIONS:  . Current Outpatient Medications:  .  Calcium-Magnesium (CAL/MAG PO), Take 1 tablet by mouth 2 (two) times daily., Disp: , Rfl:  .  estradiol (ESTRACE VAGINAL) 0.1 MG/GM vaginal cream, Estrace 0.01% (0.1 mg/gram) vaginal cream, Disp: , Rfl:  .  lenalidomide (REVLIMID) 10 MG capsule, TAKE 1 CAPSULE BY MOUTH  DAILY FOR 21 DAYS ON, THEN  7 DAYS OFF   Auth # [id number] 08/26/18, Disp: 21 capsule, Rfl: 0 .  NON FORMULARY, [place] - take 1g po daily, Disp: , Rfl:  .  rivaroxaban (XARELTO) 10 MG TABS tablet, Take 1 tablet (10 mg total) by mouth daily., Disp: 30 tablet, Rfl: 0   LABORATORY DATA:  I have reviewed the data as listed  . CBC Latest Ref Rng & Units 08/30/2018 07/05/2018 05/30/2018  WBC 4.0 - 10.5 K/uL 3.0(L) 4.8 5.8  Hemoglobin 12.0 - 15.0 g/dL 11.8(L) 12.8 12.2  Hematocrit 36.0 - 46.0 % 35.9(L) 38.8 37.6  Platelets 150 - 400 K/uL 254 351 451(H)    . CMP Latest Ref Rng & Units 08/30/2018 07/05/2018 05/30/2018  Glucose 70 - 99 mg/dL 86 132(H) 102(H)  BUN 8 - 23 mg/dL _0 Creatinine 0.44 - 1.00 mg/dL 0.73 0.89 0.70  Sodium 135 - 145 mmol/L 141 142 141  Potassium 3.5 - 5.1 mmol/L 4.0 3.7 4.4  Chloride 98 - 111 mmol/L 109 106 106  CO2 22 - 32 mmol/L _1 Calcium 8.9 - 10.3 mg/dL 8.9 10.0 9.0  Total Protein 6.5 - 8.1 g/dL 6.9 7.8 -  Total Bilirubin 0.3 - 1.2 mg/dL 0.9 0.6 -  Alkaline Phos 38 - 126 U/L 73 88 -  AST 15 - 41 U/L  15 20 -  ALT 0 - 44 U/L 16 33 -     RADIOGRAPHIC STUDIES: I have personally reviewed the radiological images as listed  and agreed with the findings in the report. No results found.  ASSESSMENT & PLAN:   62 y.o. caucasian female with  1. H/o Multiple myeloma - currently in remission.  IgA lambda R-ISS stage II multiple myeloma with innumerable lytic lesions in the calvarium, lesion in T7 vertebra and multiple lesions in the pelvis.  No significant bone pain at this time. Diagnosed in January 2017. s/p treatment as noted above 2.Pre-op assessment for myeloma  PLAN:  -Held Revlimid from 04/30/18 through her 05/14/18 uterine lift procedure and the bunionectomy on 06/25/18 to decrease risk of clots -Discussed pt labwork today, 08/30/18; some anemia post surgery with Hgb at 11.8, mild neutropenia at 1.6k. Blood chemistries are normal.  -08/30/18 MMP and SFLC are pending  -Last available MMP from 07/05/18 revealed no observable M spike -Last available SFLC from 07/05/18 revealed Kappa slightly elevated at 21.7, however K:L ratio was normal at 1.00 -Complete 31m Xarelto course for 20 more days, for continued VTE risk reduction in post-surgical setting and use of Revlimid, then transition to 862maspirin -The pt has no prohibitive toxicities from continuing 1057mevlimid at this time.   -Continue Zometa every 8 weeks  -Will see the pt back in 2 months   Continue Zometa q8weeks RTC with Dr KalIrene Limbo 8 weeks with labs   The total time spent in the appt was 20 minutes and more than 50% was on counseling and direct patient cares.   GauSullivan Lone MS Dil[place]HIVMS SCHNorthwest Mississippi Regional Medical CenterHGrace Hospitalmatology/Oncology Physician ConMemorial HospitalOffice):       [phone] cell):  [phone]):           [phone], SchBaldwin Jamaicam acting as a scribe for Dr. GauSullivan Lone .I have reviewed the above documentation for accuracy and completeness, and I agree with the above. .GaBrunetta Genera

## 2018-08-30 ENCOUNTER — Inpatient Hospital Stay: Payer: BLUE CROSS/BLUE SHIELD

## 2018-08-30 ENCOUNTER — Inpatient Hospital Stay: Payer: BLUE CROSS/BLUE SHIELD | Attending: Hematology

## 2018-08-30 ENCOUNTER — Inpatient Hospital Stay (HOSPITAL_BASED_OUTPATIENT_CLINIC_OR_DEPARTMENT_OTHER): Payer: BLUE CROSS/BLUE SHIELD | Admitting: Hematology

## 2018-08-30 ENCOUNTER — Encounter: Payer: Self-pay | Admitting: Hematology

## 2018-08-30 VITALS — BP 122/74 | HR 71 | Temp 97.8°F | Resp 18 | Ht 68.0 in | Wt 139.7 lb

## 2018-08-30 DIAGNOSIS — Z79899 Other long term (current) drug therapy: Secondary | ICD-10-CM

## 2018-08-30 DIAGNOSIS — Z9484 Stem cells transplant status: Secondary | ICD-10-CM

## 2018-08-30 DIAGNOSIS — Z7189 Other specified counseling: Secondary | ICD-10-CM

## 2018-08-30 DIAGNOSIS — C9000 Multiple myeloma not having achieved remission: Secondary | ICD-10-CM

## 2018-08-30 DIAGNOSIS — C9001 Multiple myeloma in remission: Secondary | ICD-10-CM | POA: Diagnosis not present

## 2018-08-30 LAB — CBC WITH DIFFERENTIAL/PLATELET
ABS IMMATURE GRANULOCYTES: 0 10*3/uL (ref 0.00–0.07)
Basophils Absolute: 0 10*3/uL (ref 0.0–0.1)
Basophils Relative: 1 %
EOS PCT: 6 %
Eosinophils Absolute: 0.2 10*3/uL (ref 0.0–0.5)
HCT: 35.9 % — ABNORMAL LOW (ref 36.0–46.0)
HEMOGLOBIN: 11.8 g/dL — AB (ref 12.0–15.0)
Immature Granulocytes: 0 %
LYMPHS PCT: 26 %
Lymphs Abs: 0.8 10*3/uL (ref 0.7–4.0)
MCH: 30.8 pg (ref 26.0–34.0)
MCHC: 32.9 g/dL (ref 30.0–36.0)
MCV: 93.7 fL (ref 80.0–100.0)
Monocytes Absolute: 0.4 10*3/uL (ref 0.1–1.0)
Monocytes Relative: 15 %
NEUTROS ABS: 1.6 10*3/uL — AB (ref 1.7–7.7)
NRBC: 0 % (ref 0.0–0.2)
Neutrophils Relative %: 52 %
Platelets: 254 10*3/uL (ref 150–400)
RBC: 3.83 MIL/uL — AB (ref 3.87–5.11)
RDW: 13.9 % (ref 11.5–15.5)
WBC: 3 10*3/uL — AB (ref 4.0–10.5)

## 2018-08-30 LAB — CMP (CANCER CENTER ONLY)
ALK PHOS: 73 U/L (ref 38–126)
ALT: 16 U/L (ref 0–44)
AST: 15 U/L (ref 15–41)
Albumin: 3.6 g/dL (ref 3.5–5.0)
Anion gap: 9 (ref 5–15)
BUN: 14 mg/dL (ref 8–23)
CALCIUM: 8.9 mg/dL (ref 8.9–10.3)
CO2: 23 mmol/L (ref 22–32)
Chloride: 109 mmol/L (ref 98–111)
Creatinine: 0.73 mg/dL (ref 0.44–1.00)
GFR, Estimated: >60 mL/min (ref >60)
Glucose, Bld: 86 mg/dL (ref 70–99)
Potassium: 4 mmol/L (ref 3.5–5.1)
SODIUM: 141 mmol/L (ref 135–145)
TOTAL PROTEIN: 6.9 g/dL (ref 6.5–8.1)
Total Bilirubin: 0.9 mg/dL (ref 0.3–1.2)

## 2018-08-30 MED ORDER — ZOLEDRONIC ACID 4 MG/100ML IV SOLN
4.0000 mg | Freq: Once | INTRAVENOUS | Status: AC
  Administered 2018-08-30: 4 mg via INTRAVENOUS
  Filled 2018-08-30: qty 100

## 2018-08-30 NOTE — Patient Instructions (Signed)
Thank you for choosing [facility] to provide your oncology and hematology care.  To afford each patient quality time with our providers, please arrive 30 minutes before your scheduled appointment time.  If you arrive late for your appointment, you may be asked to reschedule.  We strive to give you quality time with our providers, and arriving late affects you and other patients whose appointments are after yours.    If you are a no show for multiple scheduled visits, you may be dismissed from the clinic at the providers discretion.     Again, thank you for choosing [facility], our hope is that these requests will decrease the amount of time that you wait before being seen by our physicians.  ______________________________________________________________________   Should you have questions after your visit to the [facility], please contact our office at [phone] between the hours of 8:30 and 4:30 p.m.    Voicemails left after 4:30p.m will not be returned until the following business day.     For prescription refill requests, please have your pharmacy contact us directly.  Please also try to allow 48 hours for prescription requests.     Please contact the scheduling department for questions regarding scheduling.  For scheduling of procedures such as PET scans, CT scans, MRI, Ultrasound, etc please contact central scheduling at [phone].     Resources For Cancer Patients and Caregivers:    Oncolink.org:  A wonderful resource for patients and healthcare providers for information regarding your disease, ways to tract your treatment, what to expect, etc.      American Cancer Society:  800-227-2345  Can help patients locate various types of support and financial assistance   Cancer Care: 1-800-813-HOPE (4673) Provides financial assistance, online support groups, medication/co-pay assistance.     [place] DSS:  336-641-3447 Where to apply  for food stamps, Medicaid, and utility assistance   Medicare Rights Center: 800-333-4114 Helps people with Medicare understand their rights and benefits, navigate the Medicare system, and secure the quality healthcare they deserve   SCAT: 336-333-6589 [place] [employer]'s shared-ride transportation service for eligible riders who have a disability that prevents them from riding the fixed route bus.     For additional information on assistance programs please contact our social worker:   Abigail Elmore:  [phone]  

## 2018-08-30 NOTE — Patient Instructions (Signed)

## 2018-09-01 ENCOUNTER — Encounter: Payer: Self-pay | Admitting: Hematology

## 2018-09-02 LAB — KAPPA/LAMBDA LIGHT CHAINS
Kappa free light chain: 34.5 mg/L — ABNORMAL HIGH (ref 3.3–19.4)
Kappa, lambda light chain ratio: 1.08 (ref 0.26–1.65)
LAMDA FREE LIGHT CHAINS: 32 mg/L — AB (ref 5.7–26.3)

## 2018-09-04 ENCOUNTER — Telehealth: Payer: Self-pay

## 2018-09-04 LAB — MULTIPLE MYELOMA PANEL, SERUM
ALBUMIN/GLOB SERPL: 1.4 (ref 0.7–1.7)
ALPHA 1: 0.2 g/dL (ref 0.0–0.4)
Albumin SerPl Elph-Mcnc: 3.6 g/dL (ref 2.9–4.4)
Alpha2 Glob SerPl Elph-Mcnc: 0.6 g/dL (ref 0.4–1.0)
B-Globulin SerPl Elph-Mcnc: 1 g/dL (ref 0.7–1.3)
Gamma Glob SerPl Elph-Mcnc: 1 g/dL (ref 0.4–1.8)
Globulin, Total: 2.7 g/dL (ref 2.2–3.9)
IgA: 379 mg/dL — ABNORMAL HIGH (ref 87–352)
IgG (Immunoglobin G), Serum: 1105 mg/dL (ref 700–1600)
IgM (Immunoglobulin M), Srm: 40 mg/dL (ref 26–217)
TOTAL PROTEIN ELP: 6.3 g/dL (ref 6.0–8.5)

## 2018-09-04 NOTE — Telephone Encounter (Signed)
Oral Oncology Patient Advocate Encounter  Received notification from [place] that the existing prior authorization for Revlimid is due for renewal.  Renewal PA submitted on CoverMyMeds Key [id number] Status is pending  Oral Oncology Clinic will continue to follow.  [place] Patient [place] Phone [phone] Fax [phone]

## 2018-09-04 NOTE — Telephone Encounter (Signed)
Oral Oncology Patient Advocate Encounter  Prior Authorization for Revlimid has been approved.    PA# [id number]  Effective dates: 09/04/18 through 09/03/19  Oral Oncology Clinic will continue to follow.   [place] Patient [place] Phone [phone] Fax [phone]

## 2018-09-05 ENCOUNTER — Telehealth: Payer: Self-pay | Admitting: *Deleted

## 2018-09-05 NOTE — Telephone Encounter (Signed)
Called to ask if [employer] had contacted office about Revlimid Prior Auth. States she needs it to ship today because she is going out of town the day. Advised her that [employer] had called and that Oral Chemo pharmacy is working on PA and that Armed forces training and education officer will follow up with them and let her know when completed. She verbalized understanding.

## 2018-09-06 ENCOUNTER — Other Ambulatory Visit: Payer: Self-pay | Admitting: *Deleted

## 2018-09-06 MED ORDER — LENALIDOMIDE 10 MG PO CAPS: ORAL_CAPSULE | ORAL | 0 refills | Status: DC

## 2018-09-06 NOTE — Telephone Encounter (Signed)
Patient called and stated that with change in insurance, [facility] will no longer fill her Revlimid.  She asked that Revlimid RX be sent to [facility]) # [phone]. [facility] to [facility].

## 2018-09-06 NOTE — Telephone Encounter (Signed)
Notified patient that per pharmacy, her PA was approved. She states she will contact pharmacy to arrange delivery

## 2018-09-11 ENCOUNTER — Encounter: Payer: Self-pay | Admitting: Pharmacist

## 2018-09-11 NOTE — Progress Notes (Signed)
Oral Oncology Pharmacist Encounter  Received request to screen patient for medication interactions as she would like to start elderberry for immune boosting.  Elderberry not located inn traditional drug interaction databases. consulted [facility] About herbs website for information.  Kendall Flack has been known to inhibit CYP3A4 leading to possible decreased metabolism and increased systemic exposure to patient's Xarelto. Xarelto to be continued through next week, then patient transitioning to aspirin 81mg  daily (no interaction with elderberry).  Instructed RN to let patient know to hold elderberry initiation until Xarelto discontinued.  Johny Drilling, PharmD, BCPS, BCOP  09/11/2018 11:22 AM Oral Oncology Clinic [phone]

## 2018-09-17 ENCOUNTER — Telehealth: Payer: Self-pay | Admitting: Hematology

## 2018-09-17 ENCOUNTER — Telehealth: Payer: Self-pay | Admitting: *Deleted

## 2018-09-17 ENCOUNTER — Telehealth: Payer: Self-pay

## 2018-09-17 NOTE — Telephone Encounter (Signed)
After speaking with patient. I explained that I [facility]) could not move her appointments without the provider approval due to infusion. Forward her to [facility] RN to follow up with patient request. Per 12/24 phone msg return call.

## 2018-09-17 NOTE — Telephone Encounter (Signed)
R/s appt per 12/24 sch message - left message for patient with appt date and time

## 2018-09-17 NOTE — Telephone Encounter (Signed)
Patient has Zometa ordered every 8 weeks. Cannot come to appt scheduled on January 31 due to work. Can come on January 27 if okay with MD. Per [name], okay to schedule for Zometa on January 27. Notified patient to expect call from scheduling. Patient verbalized understanding.

## 2018-10-01 ENCOUNTER — Other Ambulatory Visit: Payer: Self-pay | Admitting: *Deleted

## 2018-10-01 ENCOUNTER — Other Ambulatory Visit: Payer: Self-pay | Admitting: Hematology

## 2018-10-01 MED ORDER — LENALIDOMIDE 10 MG PO CAPS: ORAL_CAPSULE | ORAL | 0 refills | Status: DC

## 2018-10-17 ENCOUNTER — Telehealth: Payer: Self-pay | Admitting: Internal Medicine

## 2018-10-17 NOTE — Telephone Encounter (Signed)
Returned call to patient. She paid for device out of pocket and is trying to be reimbursed. We will fax a copy of HST to help support her claim. [phone]. Nothing further needed.

## 2018-10-21 ENCOUNTER — Inpatient Hospital Stay: Payer: BLUE CROSS/BLUE SHIELD

## 2018-10-21 ENCOUNTER — Inpatient Hospital Stay: Payer: BLUE CROSS/BLUE SHIELD | Attending: Hematology

## 2018-10-21 ENCOUNTER — Inpatient Hospital Stay (HOSPITAL_BASED_OUTPATIENT_CLINIC_OR_DEPARTMENT_OTHER): Payer: BLUE CROSS/BLUE SHIELD | Admitting: Hematology

## 2018-10-21 VITALS — BP 123/72 | HR 85 | Temp 98.0°F | Resp 18 | Ht 68.0 in | Wt 135.0 lb

## 2018-10-21 DIAGNOSIS — Z7189 Other specified counseling: Secondary | ICD-10-CM

## 2018-10-21 DIAGNOSIS — C9001 Multiple myeloma in remission: Secondary | ICD-10-CM | POA: Diagnosis not present

## 2018-10-21 DIAGNOSIS — C9000 Multiple myeloma not having achieved remission: Secondary | ICD-10-CM

## 2018-10-21 LAB — CBC WITH DIFFERENTIAL/PLATELET
Abs Immature Granulocytes: 0.02 10*3/uL (ref 0.00–0.07)
Basophils Absolute: 0 10*3/uL (ref 0.0–0.1)
Basophils Relative: 0 %
Eosinophils Absolute: 0.1 10*3/uL (ref 0.0–0.5)
Eosinophils Relative: 1 %
HCT: 39.1 % (ref 36.0–46.0)
Hemoglobin: 12.7 g/dL (ref 12.0–15.0)
Immature Granulocytes: 0 %
Lymphocytes Relative: 11 %
Lymphs Abs: 0.6 10*3/uL — ABNORMAL LOW (ref 0.7–4.0)
MCH: 30.2 pg (ref 26.0–34.0)
MCHC: 32.5 g/dL (ref 30.0–36.0)
MCV: 93.1 fL (ref 80.0–100.0)
Monocytes Absolute: 0.5 10*3/uL (ref 0.1–1.0)
Monocytes Relative: 8 %
NEUTROS PCT: 80 %
Neutro Abs: 4.5 10*3/uL (ref 1.7–7.7)
PLATELETS: 287 10*3/uL (ref 150–400)
RBC: 4.2 MIL/uL (ref 3.87–5.11)
RDW: 14.1 % (ref 11.5–15.5)
WBC: 5.7 10*3/uL (ref 4.0–10.5)
nRBC: 0 % (ref 0.0–0.2)

## 2018-10-21 LAB — CMP (CANCER CENTER ONLY)
ALBUMIN: 3.9 g/dL (ref 3.5–5.0)
ALT: 23 U/L (ref 0–44)
ANION GAP: 6 (ref 5–15)
AST: 18 U/L (ref 15–41)
Alkaline Phosphatase: 80 U/L (ref 38–126)
BUN: 15 mg/dL (ref 8–23)
CO2: 28 mmol/L (ref 22–32)
Calcium: 9.3 mg/dL (ref 8.9–10.3)
Chloride: 106 mmol/L (ref 98–111)
Creatinine: 0.82 mg/dL (ref 0.44–1.00)
GFR, Est AFR Am: >60 mL/min (ref >60)
GFR, Estimated: >60 mL/min (ref >60)
Glucose, Bld: 112 mg/dL — ABNORMAL HIGH (ref 70–99)
Potassium: 4 mmol/L (ref 3.5–5.1)
Sodium: 140 mmol/L (ref 135–145)
Total Bilirubin: 0.8 mg/dL (ref 0.3–1.2)
Total Protein: 7.8 g/dL (ref 6.5–8.1)

## 2018-10-21 MED ORDER — ZOLEDRONIC ACID 4 MG/100ML IV SOLN
4.0000 mg | Freq: Once | INTRAVENOUS | Status: AC
  Administered 2018-10-21: 4 mg via INTRAVENOUS
  Filled 2018-10-21: qty 100

## 2018-10-21 MED ORDER — SODIUM CHLORIDE 0.9 % IV SOLN
INTRAVENOUS | Status: DC
  Administered 2018-10-21: 16:00:00 via INTRAVENOUS
  Filled 2018-10-21: qty 250

## 2018-10-21 NOTE — Patient Instructions (Signed)

## 2018-10-21 NOTE — Progress Notes (Signed)
HEMATOLOGY/ONCOLOGY CLINIC NOTE  Date of Service: 10/21/18  PCP: .[name], MD  CC:  F/u for continue mx of multiple myeloma  CHIEF COMPLAINTS/PURPOSE OF CONSULTATION:  Follow up for multiple myeloma  DIAGNOSIS  IgA lambda R-ISS stage II multiple myeloma with innumerable lytic lesions in the calvarium, lesion in T7 vertebra and multiple lesions in the pelvis.  No significant bone pain at this time. Diagnosed in January 2017.  Current treatment  Restarting on Revlimid 23m po daily 3 weeks on 1 week off (after treatment interruption)   Previous Treatment  Status post Vd x 1 cycle VRd x 6 cycles  HD Melphalan 2020mm2 on 03/22/2016 Autologous HSCT on 03/23/2016 with Dr GaSamule [facility] 6.4 x10^6/kg)  -Revlimid maintenance 1549mo daily    INTERVAL HISTORY:   Ms CooOrellana here for management and evaluation of her multiple myeloma. The patient's last visit with us Kore[place] on 08/30/18. The pt reports that she is doing well overall.   The pt reports that she developed a flat, itchy rash in the inside of her left elbow at the end of December. She has been using lotion and hydrocortisone cream with some improvement, and took benadryl for a few days as well. She also had a cough and cold around January 3, which has resolved.   Lab results today (10/21/18) of CBC w/diff and CMP is as follows: all values are WNL except for Lymphs abs at 600. 10/21/18 MMP and SFLC are pending  On review of systems, pt reports recent resolving itchy rash on inner left elbow, resolved cough and cold, and denies abdominal pains, leg swelling, bone pains, and any other symptoms.   REVIEW OF SYSTEMS:    A 10+ POINT REVIEW OF SYSTEMS WAS OBTAINED including neurology, dermatology, psychiatry, cardiac, respiratory, lymph, extremities, GI, GU, Musculoskeletal, constitutional, breasts, reproductive, HEENT.  All pertinent positives are noted in the HPI.  All others are negative.   PHYSICAL  EXAMINATION: NAD VS reviewed in [place] no acute distress and comfortable SKIN: no acute rashes, no significant lesions EYES: conjunctiva are pink and non-injected, sclera anicteric OROPHARYNX: MMM, no exudates, no oropharyngeal erythema or ulceration NECK: supple, no JVD LYMPH:  no palpable lymphadenopathy in the cervical, axillary or inguinal regions LUNGS: clear to auscultation b/l with normal respiratory effort HEART: regular rate & rhythm ABDOMEN:  normoactive bowel sounds , non tender, not distended. No palpable hepatosplenomegaly.  Extremity: no pedal edema PSYCH: alert & oriented x 3 with fluent speech NEURO: no focal motor/sensory deficits   MEDICAL HISTORY:   Past Medical History:  Diagnosis Date  . Abnormal Pap smear of vagina 03/22/2017   ASCUS pap and negative HR HPV.  Patient is on immunosuppressive medication.   . Anemia   . AR (aortic regurgitation) 11/30/2017   trace noted on ECHO  . Atrial septal aneurysm per 11-30-17 echo   trivial pericardial effusion  . [place]   squamous cell carcinoma of thymus  . DDD (degenerative disc disease), cervical   . DDD (degenerative disc disease), lumbosacral   . Facet degeneration of lumbar region   . Facet joint disease of cervical region   . Fatigue   . Fatty liver   . Fibroid    reason for Hysterectomy  . GERD (gastroesophageal reflux disease)   . Grade I diastolic dysfunction   . History of cystocele   . History of stress incontinence   . Hypergammaglobulinemia   . Hyperlipidemia   .  Lung nodule   . Migraine    episode of aphasia worked up with echo by [place] [facility] 11-23-17, has visual migraines  . MR (mitral regurgitation) 11/30/2017   trace noted on ECHO  . Multiple myeloma ([place]) 08/2015   metastatic to bone  . Muscular fasciculation   . Nasal septal deviation   . Nasal turbinate hypertrophy   . Near syncope   . Peripheral edema   . Plasma cell dyscrasia   . PONV  (postoperative nausea and vomiting)    thinks morphine caused nausea  . Postoperative urinary retention 05/14/2018  . Restless leg syndrome   . Sleep apnea    uses old oral allialnce for osa due to cpap intolerance  . TR (tricuspid regurgitation) 11/30/2017   trace noted on ECHO  . Urinary incontinence   . UTI (urinary tract infection)   . Wears glasses     SURGICAL HISTORY: Past Surgical History:  Procedure Laterality Date  . ABDOMINAL HYSTERECTOMY  1997   TAH--ovaries remain--Dr. Newton Pigg  . ANTERIOR AND POSTERIOR REPAIR N/A 05/14/2018   Procedure: ANTERIOR (CYSTOCELE) AND POSTERIOR REPAIR (RECTOCELE);  Surgeon: [name], MD;  Location: [facility];  Service: Gynecology;  Laterality: N/A;  . BLADDER SUSPENSION N/A 05/14/2018   Procedure: TRANSVAGINAL TAPE (TVT) PROCEDURE exact midurethral sling;  Surgeon: [name], MD;  Location: [facility];  Service: Gynecology;  Laterality: N/A;  . BONE MARROW TRANSPLANT  2017   done at [facility]  . BREAST BIOPSY    . COLONOSCOPY    . CYSTOSCOPY N/A 05/14/2018   Procedure: CYSTOSCOPY;  Surgeon: [name], MD;  Location: [facility];  Service: Gynecology;  Laterality: N/A;  . CYSTOSCOPY N/A 05/30/2018   Procedure: CYSTOSCOPY;  Surgeon: [name], MD;  Location: [facility];  Service: Gynecology;  Laterality: N/A;  . ROBOTIC ASSISTED LAPAROSCOPIC SACROCOLPOPEXY N/A 05/14/2018   Procedure: XI ROBOTIC ASSISTED LAPAROSCOPIC SACROCOLPOPEXY;  Surgeon: [name], MD;  Location: [facility];  Service: Gynecology;  Laterality: N/A;  6 hours OR time. Need extended stay recovery bed.  Marland Kitchen SALPINGOOPHORECTOMY Bilateral 05/14/2018   Procedure: SALPINGO OOPHORECTOMY;  Surgeon: [name], MD;  Location: [facility];  Service: Gynecology;  Laterality: Bilateral;  . THYMECTOMY  1999  . TRANSVAGINAL TAPE (TVT) REMOVAL N/A 05/30/2018   Procedure: TRANSVAGINAL TAPE (TVT)  Revision;   Surgeon: [name], MD;  Location[facility];  Service: Gynecology;  Laterality: N/A;  . WISDOM TOOTH EXTRACTION      SOCIAL HISTORY: Social History   Socioeconomic History  . Marital status: Married    Spouse name: [name]  . Number of children: 2  . Years of education: college  . 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 Financial resource strain: Not on file  . Food insecurity:    Worry: Not on file    Inability: Not on file  . Transportation needs:    Medical: Not on file    Non-medical: Not on file  Tobacco Use  . Smoking status: Never Smoker  . Smokeless tobacco: Never Used  Substance and Sexual Activity  . Alcohol use: Yes    Alcohol/week: 0.0 standard drinks    Comment: once a year drink  . Drug use: Never  . Sexual activity: Not Currently  Partners: Male    Birth control/protection: Surgical    Comment: TAH--ovaries remain  Lifestyle  . Physical activity:    Days per week: Not on file    Minutes per session: Not on file  . Stress: Not on file  Relationships  . Social connections:    Talks on phone: Not on file    Gets together: Not on file    Attends religious service: Not on file    Active member of club or organization: Not on file    Attends meetings of clubs or organizations: Not on file    Relationship status: Not on file  . Intimate partner violence:    Fear of current or ex partner: Not on file    Emotionally abused: Not on file    Physically abused: Not on file    Forced sexual activity: Not on file  Other Topics Concern  . Not on file  Social History Narrative   Lives with husband   Caffeine- coffee, 2 cups daily    FAMILY HISTORY: Family History  Problem Relation Age of Onset  . COPD Father        dec age 20/s-smoked  . Macular degeneration Father   . Diabetes Brother   . Other Brother        committed suicide age 20  .  Macular degeneration Brother   . Hyperlipidemia Mother   . Diabetes Maternal Grandmother   . Heart attack Paternal Grandfather     ALLERGIES:  is allergic to ampicillin and penicillins.  MEDICATIONS:  . Current Outpatient Medications:  .  Calcium-Magnesium (CAL/MAG PO), Take 1 tablet by mouth 2 (two) times daily., Disp: , Rfl:  .  estradiol (ESTRACE VAGINAL) 0.1 MG/GM vaginal cream, Estrace 0.01% (0.1 mg/gram) vaginal cream, Disp: , Rfl:  .  lenalidomide (REVLIMID) 10 MG capsule, TAKE 1 CAPSULE BY MOUTH  DAILY FOR 21 DAYS ON, THEN  7 DAYS OFF   Auth#[id number] 10/01/2018, Disp: 21 capsule, Rfl: 0 .  NON FORMULARY, [place] - take 1g po daily, Disp: , Rfl:  .  rivaroxaban (XARELTO) 10 MG TABS tablet, Take 1 tablet (10 mg total) by mouth daily., Disp: 30 tablet, Rfl: 0   LABORATORY DATA:  I have reviewed the data as listed  . CBC Latest Ref Rng & Units 10/21/2018 08/30/2018 07/05/2018  WBC 4.0 - 10.5 K/uL 5.7 3.0(L) 4.8  Hemoglobin 12.0 - 15.0 g/dL 12.7 11.8(L) 12.8  Hematocrit 36.0 - 46.0 % 39.1 35.9(L) 38.8  Platelets 150 - 400 K/uL 287 254 351    . CMP Latest Ref Rng & Units 08/30/2018 07/05/2018 05/30/2018  Glucose 70 - 99 mg/dL 86 132(H) 102(H)  BUN 8 - 23 mg/dL 14 18 19   Creatinine 0.44 - 1.00 mg/dL 0.73 0.89 0.70  Sodium 135 - 145 mmol/L 141 142 141  Potassium 3.5 - 5.1 mmol/L 4.0 3.7 4.4  Chloride 98 - 111 mmol/L 109 106 106  CO2 22 - 32 mmol/L 23 25 27   Calcium 8.9 - 10.3 mg/dL 8.9 10.0 9.0  Total Protein 6.5 - 8.1 g/dL 6.9 7.8 -  Total Bilirubin 0.3 - 1.2 mg/dL 0.9 0.6 -  Alkaline Phos 38 - 126 U/L 73 88 -  AST 15 - 41 U/L 15 20 -  ALT 0 - 44 U/L 16 33 -   10/07/18 Repeat BM Bx from [facility]:    RADIOGRAPHIC STUDIES: I have personally reviewed the radiological images as listed and agreed with the findings in the report.  No results found.  ASSESSMENT & PLAN:   63 y.o. caucasian female with  1. H/o Multiple myeloma - currently in remission.  IgA lambda R-ISS stage  II multiple myeloma with innumerable lytic lesions in the calvarium, lesion in T7 vertebra and multiple lesions in the pelvis.  No significant bone pain at this time. Diagnosed in January 2017. s/p treatment as noted above 2.Pre-op assessment for myeloma  PLAN:  -Discussed pt labwork today, 10/21/18; WBC normalized to 5.0k, HGB normalized to 12.7, PLT normal at 287k. -10/21/18 MMP and SFLC are pending. Last available MMP from 08/30/18 did not observe an M spike. Last available SFLC from 08/30/18 revealed a K:L ratio at 1.08. -Discussed the 10/07/18 BM Bx and Flow cytometry which indicate that the pt continues to be in CR -Held Revlimid from 04/30/18 through her 05/14/18 uterine lift procedure and the bunionectomy on 06/25/18 to decrease risk of clots -The pt has no prohibitive toxicities from continuing 47m Revlimid at this time. -Continue 859maspirin -Continue with liberal lotion application, and begin Claritin  -Could add Pepcid as well  -Continue Zometa every 8 weeks  -Will see the pt back in 8 weeks   Continue Zometa q8weeks RTC with Dr KaIrene Limbon 8 weeks with labs   The total time spent in the appt was 25 minutes and more than 50% was on counseling and direct patient cares.   GaSullivan LoneD MSMancelonaAHIVMS SCStafford County HospitalTHarlingen Medical Centerematology/Oncology Physician CoProvidence Kodi[facility](Office):       [phone] cell):  [phone]):           [phone], ScBaldwin Jamaicaam acting as a scribe for Dr. GaSullivan Lone  .I have reviewed the above documentation for accuracy and completeness, and I agree with the above. .GBrunetta GeneraD

## 2018-10-22 LAB — KAPPA/LAMBDA LIGHT CHAINS
Kappa free light chain: 40.6 mg/L — ABNORMAL HIGH (ref 3.3–19.4)
Kappa, lambda light chain ratio: 1.2 (ref 0.26–1.65)
Lambda free light chains: 33.8 mg/L — ABNORMAL HIGH (ref 5.7–26.3)

## 2018-10-22 IMAGING — CR DG CHEST 2V
2 series · 2 of 2 positions shown · non-contrast
Comparison: 09/29/2015

CLINICAL DATA: Weakness, fever

EXAM:
CHEST  2 VIEW

[w chest pa]
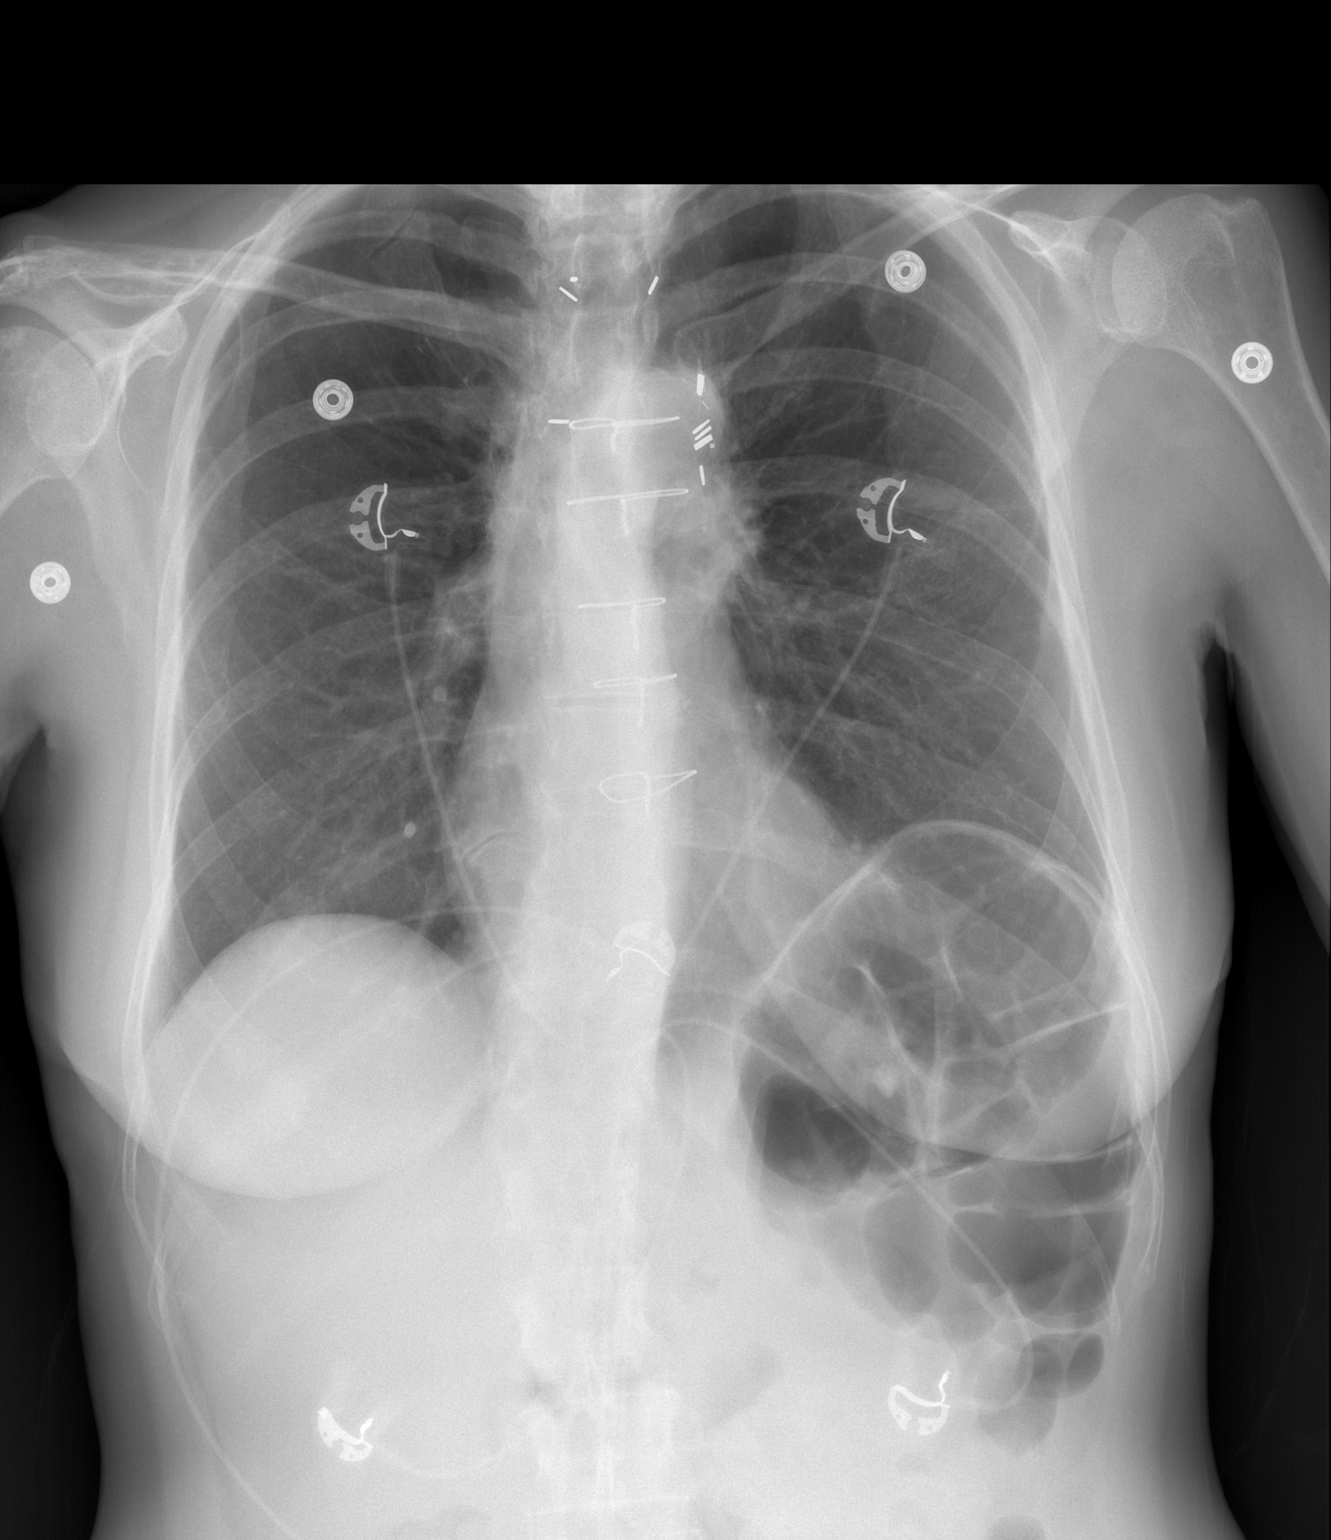

[w chest lat]
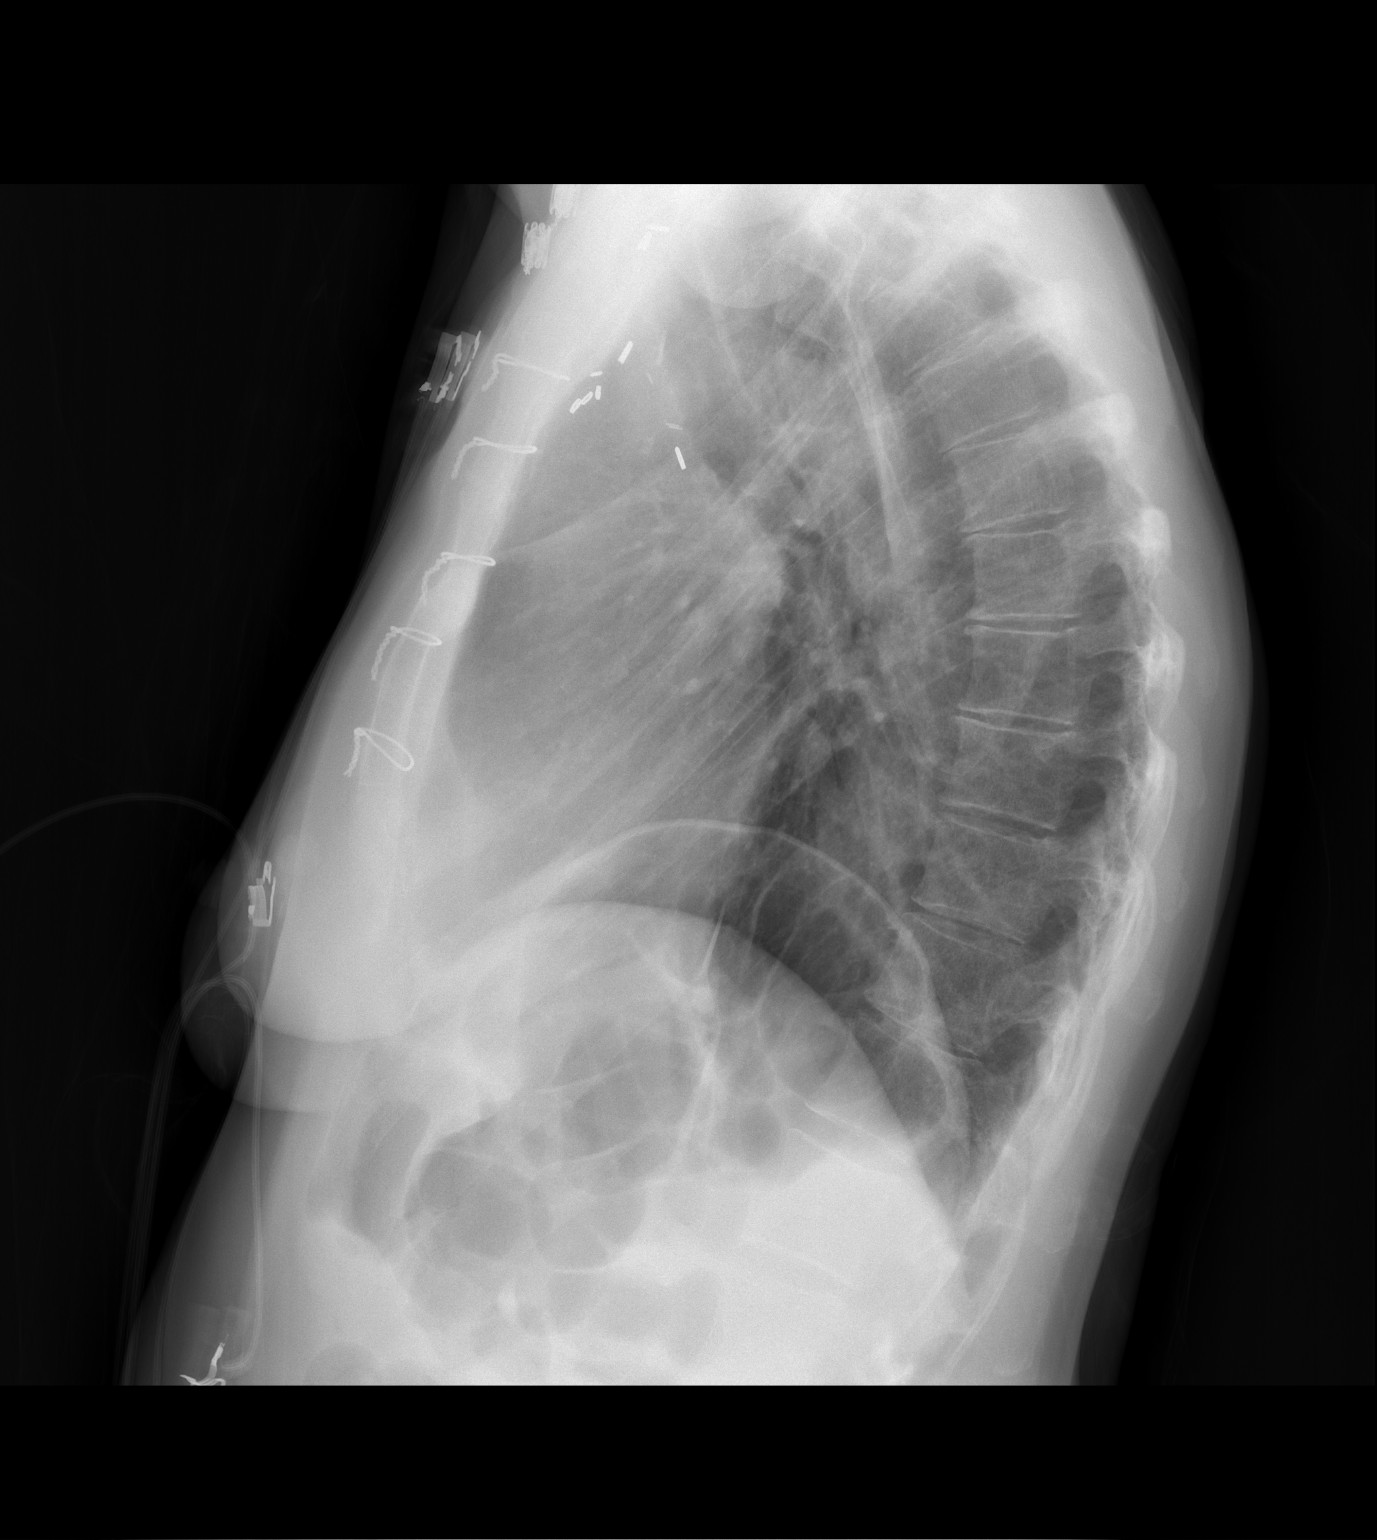

[2 of 2 positions shown; findings below may reference images not displayed]

FINDINGS: Prior median sternotomy. Heart is normal size. Lungs are clear. No
effusions. No acute bony abnormality.
IMPRESSION: No active cardiopulmonary disease.

## 2018-10-23 LAB — MULTIPLE MYELOMA PANEL, SERUM
ALBUMIN/GLOB SERPL: 1.3 (ref 0.7–1.7)
Albumin SerPl Elph-Mcnc: 4 g/dL (ref 2.9–4.4)
Alpha 1: 0.2 g/dL (ref 0.0–0.4)
Alpha2 Glob SerPl Elph-Mcnc: 0.7 g/dL (ref 0.4–1.0)
B-GLOBULIN SERPL ELPH-MCNC: 1.1 g/dL (ref 0.7–1.3)
Gamma Glob SerPl Elph-Mcnc: 1.1 g/dL (ref 0.4–1.8)
Globulin, Total: 3.1 g/dL (ref 2.2–3.9)
IgA: 419 mg/dL — ABNORMAL HIGH (ref 87–352)
IgG (Immunoglobin G), Serum: 1227 mg/dL (ref 700–1600)
IgM (Immunoglobulin M), Srm: 40 mg/dL (ref 26–217)
Total Protein ELP: 7.1 g/dL (ref 6.0–8.5)

## 2018-10-24 ENCOUNTER — Telehealth: Payer: Self-pay

## 2018-10-24 NOTE — Telephone Encounter (Signed)
Left a detailed msg concerning upcoming scheduled appointments per 1/27 los.Mailed a letter with a calender enclosed

## 2018-10-25 ENCOUNTER — Other Ambulatory Visit: Payer: BLUE CROSS/BLUE SHIELD

## 2018-10-25 ENCOUNTER — Ambulatory Visit: Payer: BLUE CROSS/BLUE SHIELD | Admitting: Hematology

## 2018-10-25 ENCOUNTER — Ambulatory Visit: Payer: BLUE CROSS/BLUE SHIELD

## 2018-10-25 ENCOUNTER — Telehealth: Payer: Self-pay | Admitting: Hematology

## 2018-10-25 ENCOUNTER — Telehealth: Payer: Self-pay | Admitting: *Deleted

## 2018-10-25 NOTE — Telephone Encounter (Signed)
-----   Message from Rolland Bimler, RN sent at 10/25/2018 11:41 AM EST -----  ----- Message ----- From: Brunetta Genera, MD Sent: 10/24/2018   2:47 PM EST To: Rolland Bimler, RN  Could you please let patient know her myeloma panel shows she continued to be in remission. Continue current plan of care and followup. Thanks [place]

## 2018-10-25 NOTE — Telephone Encounter (Signed)
Tried to reach patient regarding 3/30 she will call nurse

## 2018-10-25 NOTE — Telephone Encounter (Signed)
Spoke with patient, let her know her myeloma is still in remission. Patient requested a different day for her next lab / dr visit /infusion, due to work schedule. Los to schedulers to move appts.

## 2018-10-29 ENCOUNTER — Telehealth: Payer: Self-pay | Admitting: Hematology

## 2018-10-29 ENCOUNTER — Other Ambulatory Visit: Payer: Self-pay | Admitting: Hematology

## 2018-10-29 NOTE — Telephone Encounter (Signed)
Revlimid refilled.

## 2018-10-29 NOTE — Telephone Encounter (Signed)
Called pt per 1/31 sch message - r/s appt - unable  to reach patient - left message with appt date and time

## 2018-10-31 ENCOUNTER — Other Ambulatory Visit: Payer: Self-pay | Admitting: *Deleted

## 2018-10-31 MED ORDER — LENALIDOMIDE 10 MG PO CAPS: ORAL_CAPSULE | ORAL | 0 refills | Status: DC

## 2018-10-31 NOTE — Telephone Encounter (Signed)
Patient notified office that [facility] has not received the prescription for Revlimid. Prescription sent in on 2/4, receipt confirmed by pharmacy. Contacted pharmacy, per Brianne, they did not receive prescription and asked that it be resent. Resent prescription electronically. Patient notified of same.

## 2018-11-08 ENCOUNTER — Telehealth: Payer: Self-pay | Admitting: *Deleted

## 2018-11-08 NOTE — Telephone Encounter (Signed)
Patient called - asked to change appt currently for lab/MD/ Infusion Zometa on 3/30 to afternoon of 4/3 due to work schedule. Advised her that MD will be consulted and if ok, scheduling message will be sent.

## 2018-11-19 ENCOUNTER — Other Ambulatory Visit: Payer: Self-pay | Admitting: *Deleted

## 2018-11-19 MED ORDER — LENALIDOMIDE 10 MG PO CAPS: ORAL_CAPSULE | ORAL | 0 refills | Status: DC

## 2018-11-19 NOTE — Telephone Encounter (Signed)
Revlimid refill sent to [facility], [place]. [facility] Josem Kaufmann #[id number] 11/19/2018

## 2018-11-23 ENCOUNTER — Telehealth: Payer: Self-pay | Admitting: Hematology

## 2018-11-23 NOTE — Telephone Encounter (Signed)
Per sch msg, patient wants to reschedule 4/3 appt for afternoon. Spoke with patient, advised nothing available for that afternoon, patient will keep the appt the same

## 2018-12-16 ENCOUNTER — Other Ambulatory Visit: Payer: Self-pay | Admitting: Hematology

## 2018-12-17 ENCOUNTER — Other Ambulatory Visit: Payer: Self-pay | Admitting: *Deleted

## 2018-12-17 MED ORDER — LENALIDOMIDE 10 MG PO CAPS: ORAL_CAPSULE | ORAL | 0 refills | Status: DC

## 2018-12-17 NOTE — Telephone Encounter (Signed)
Refilled Revlimid per [name] office visit note 10/21/2018. Refill sent to [facility], [place].  [employer] GOVP#[id number], 12/17/2018

## 2018-12-23 ENCOUNTER — Ambulatory Visit: Payer: BLUE CROSS/BLUE SHIELD

## 2018-12-23 ENCOUNTER — Ambulatory Visit: Payer: BLUE CROSS/BLUE SHIELD | Admitting: Hematology

## 2018-12-23 ENCOUNTER — Other Ambulatory Visit: Payer: BLUE CROSS/BLUE SHIELD

## 2018-12-26 ENCOUNTER — Telehealth: Payer: Self-pay | Admitting: *Deleted

## 2018-12-26 NOTE — Telephone Encounter (Signed)
Patient called office. Was in contact with MD at work [place] Friday who reported symptoms of [place] over weekend. She is in self quarantine. Has appt on Friday 4/3 for lab/MD/Zometa and asks to postpone 1 month if ok with [name]. Per [name] - reschedule all appointments after 1 month to allow for completion of self quarantine, unless patient becomes symptomatic. If that occurs, patient should contact office to reevaluate appt time. Contacted patient with above directions. Patient verbalized understanding. Appt for Friday 4/3 cancelled. Sched msg sent.

## 2018-12-27 ENCOUNTER — Ambulatory Visit: Payer: BLUE CROSS/BLUE SHIELD

## 2018-12-27 ENCOUNTER — Ambulatory Visit: Payer: BLUE CROSS/BLUE SHIELD | Admitting: Hematology

## 2018-12-27 ENCOUNTER — Other Ambulatory Visit: Payer: BLUE CROSS/BLUE SHIELD

## 2018-12-31 ENCOUNTER — Telehealth: Payer: Self-pay | Admitting: Hematology

## 2018-12-31 NOTE — Telephone Encounter (Signed)
R/s appt per  4/2 sch message - unable to reach patient . Left message with apt date and time

## 2019-01-10 ENCOUNTER — Other Ambulatory Visit: Payer: Self-pay | Admitting: Hematology

## 2019-01-30 NOTE — Progress Notes (Signed)
HEMATOLOGY/ONCOLOGY CLINIC NOTE  Date of Service: 01/31/19  PCP: .[name], MD  CC:  F/u for continue mx of multiple myeloma  CHIEF COMPLAINTS/PURPOSE OF CONSULTATION:  Follow up for multiple myeloma  DIAGNOSIS  IgA lambda R-ISS stage II multiple myeloma with innumerable lytic lesions in the calvarium, lesion in T7 vertebra and multiple lesions in the pelvis.  No significant bone pain at this time. Diagnosed in January 2017.  Current treatment  Restarting on Revlimid 42m po daily 3 weeks on 1 week off (after treatment interruption)   Previous Treatment  Status post Vd x 1 cycle VRd x 6 cycles  HD Melphalan 2069mm2 on 03/22/2016 Autologous HSCT on 03/23/2016 with Dr [facility]Samule [facility]mt [facility] 6.4 x10^6/kg)  -Revlimid maintenance     INTERVAL HISTORY:   Paula Johnson here for management and evaluation of her multiple myeloma. The patient's last visit with us[place] on 10/21/18. The pt reports that she is doing well overall.  The pt reports that she has not developed any new concerns in the interim. She notes that she is continuing to heal from her previous bunion surgery. She has continued on 8126mspirin and Revlimid as well. She denies mouth sores. She notes that she has gained some weight and has been eating well. She has recently retired which has been enjoyable for her.  Lab results today (01/31/19) of CBC w/diff and CMP is as follows: all values are WNL except for WBC at 3.0k, ANC at 1.5k. 01/31/19 MMP is pending  On review of systems, pt reports good energy levels, moving her bowels well, eating well, weight gain, and denies concerns for infections, abdominal pains, diarrhea, leg swelling, bone pains, and any other symptoms.   REVIEW OF SYSTEMS:    A 10+ POINT REVIEW OF SYSTEMS WAS OBTAINED including neurology, dermatology, psychiatry, cardiac, respiratory, lymph, extremities, GI, GU, Musculoskeletal, constitutional, breasts, reproductive, HEENT.  All  pertinent positives are noted in the HPI.  All others are negative.   PHYSICAL EXAMINATION: NAD VS reviewed in [place] no acute distress and comfortable SKIN: no acute rashes, no significant lesions EYES: conjunctiva are pink and non-injected, sclera anicteric OROPHARYNX: MMM, no exudates, no oropharyngeal erythema or ulceration NECK: supple, no JVD LYMPH:  no palpable lymphadenopathy in the cervical, axillary or inguinal regions LUNGS: clear to auscultation b/l with normal respiratory effort HEART: regular rate & rhythm ABDOMEN:  normoactive bowel sounds , non tender, not distended. No palpable hepatosplenomegaly.  Extremity: no pedal edema PSYCH: alert & oriented x 3 with fluent speech NEURO: no focal motor/sensory deficits   MEDICAL HISTORY:   Past Medical History:  Diagnosis Date   Abnormal Pap smear of vagina 03/22/2017   ASCUS pap and negative HR HPV.  Patient is on immunosuppressive medication.    Anemia    AR (aortic regurgitation) 11/30/2017   trace noted on ECHO   Atrial septal aneurysm per 11-30-17 echo   trivial pericardial effusion   Cancer (HCC) 1999   squamous cell carcinoma of thymus   DDD (degenerative disc disease), cervical    DDD (degenerative disc disease), lumbosacral    Facet degeneration of lumbar region    Facet joint disease of cervical region    Fatigue    Fatty liver    Fibroid    reason for Hysterectomy   GERD (gastroesophageal reflux disease)    Grade I diastolic dysfunction    History of cystocele    History of stress incontinence  Hypergammaglobulinemia    Hyperlipidemia    Lung nodule    Migraine    episode of aphasia worked up with echo by [facility] 11-23-17, has visual migraines   MR (mitral regurgitation) 11/30/2017   trace noted on ECHO   Multiple myeloma ([place]) 08/2015   metastatic to bone   Muscular fasciculation    Nasal septal deviation    Nasal turbinate hypertrophy     Near syncope    Peripheral edema    Plasma cell dyscrasia    PONV (postoperative nausea and vomiting)    thinks morphine caused nausea   Postoperative urinary retention 05/14/2018   Restless leg syndrome    Sleep apnea    uses old oral allialnce for osa due to cpap intolerance   TR (tricuspid regurgitation) 11/30/2017   trace noted on ECHO   Urinary incontinence    UTI (urinary tract infection)    Wears glasses     SURGICAL HISTORY: Past Surgical History:  Procedure Laterality Date   ABDOMINAL HYSTERECTOMY  1997   TAH--ovaries remain--Dr. Newton Pigg   ANTERIOR AND POSTERIOR REPAIR N/A 05/14/2018   Procedure: ANTERIOR (CYSTOCELE) AND POSTERIOR REPAIR (RECTOCELE);  Surgeon: [name], MD;  Location: [facility];  Service: Gynecology;  Laterality: N/A;   BLADDER SUSPENSION N/A 05/14/2018   Procedure: TRANSVAGINAL TAPE (TVT) PROCEDURE exact midurethral sling;  Surgeon: [name], MD;  Location: [facility];  Service: Gynecology;  Laterality: N/A;   BONE MARROW TRANSPLANT  2017   done at [place] N/A 05/14/2018   Procedure: CYSTOSCOPY;  Surgeon: [name], MD;  Location: [facility];  Service: Gynecology;  Laterality: N/A;   CYSTOSCOPY N/A 05/30/2018   Procedure: CYSTOSCOPY;  Surgeon: [name], MD;  Location: [facility];  Service: Gynecology;  Laterality: N/A;   ROBOTIC ASSISTED LAPAROSCOPIC SACROCOLPOPEXY N/A 05/14/2018   Procedure: XI ROBOTIC ASSISTED LAPAROSCOPIC SACROCOLPOPEXY;  Surgeon: [name], MD;  Location: [facility];  Service: Gynecology;  Laterality: N/A;  6 hours OR time. Need extended stay recovery bed.   SALPINGOOPHORECTOMY Bilateral 05/14/2018   Procedure: SALPINGO OOPHORECTOMY;  Surgeon: [name], MD;  Location: [facility];  Service: Gynecology;  Laterality: Bilateral;   THYMECTOMY  1999   TRANSVAGINAL TAPE (TVT)  REMOVAL N/A 05/30/2018   Procedure: TRANSVAGINAL TAPE (TVT)  Revision;  Surgeon: [name], MD;  Location: [facility];  Service: Gynecology;  Laterality: N/A;   WISDOM TOOTH EXTRACTION      SOCIAL HISTORY: Social History   Socioeconomic History   Marital status: Married    Spouse name: Paula Johnson   Number of children: 2   Years of education: college   Highest education level: Not on file  Occupational History    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, [employer]  Social Needs   Financial resource strain: Not on file   Food insecurity:    Worry: Not on file    Inability: Not on file   Transportation needs:    Medical: Not on file    Non-medical: Not on file  Tobacco Use   Smoking status: Never Smoker   Smokeless tobacco: Never Used  Substance and Sexual Activity   Alcohol use: Yes    Alcohol/week: 0.0 standard drinks    Comment: once a year drink   Drug use: Never  Sexual activity: Not Currently    Partners: Male    Birth control/protection: Surgical    Comment: TAH--ovaries remain  Lifestyle   Physical activity:    Days per week: Not on file    Minutes per session: Not on file   Stress: Not on file  Relationships   Social connections:    Talks on phone: Not on file    Gets together: Not on file    Attends religious service: Not on file    Active member of club or organization: Not on file    Attends meetings of clubs or organizations: Not on file    Relationship status: Not on file   Intimate partner violence:    Fear of current or ex partner: Not on file    Emotionally abused: Not on file    Physically abused: Not on file    Forced sexual activity: Not on file  Other Topics Concern   Not on file  Social History Narrative   Lives with husband   Caffeine- coffee, 2 cups daily    FAMILY HISTORY: Family History  Problem Relation Age of Onset   COPD Father        dec age 69/s-smoked   Macular degeneration Father     Diabetes Brother    Other Brother        committed suicide age 36   Macular degeneration Brother    Hyperlipidemia Mother    Diabetes Maternal Grandmother    Heart attack Paternal Grandfather     ALLERGIES:  is allergic to ampicillin and penicillins.  MEDICATIONS:  . Current Outpatient Medications:    Calcium-Magnesium (CAL/MAG PO), Take 1 tablet by mouth 2 (two) times daily., Disp: , Rfl:    estradiol (ESTRACE VAGINAL) 0.1 MG/GM vaginal cream, Estrace 0.01% (0.1 mg/gram) vaginal cream, Disp: , Rfl:    lenalidomide (REVLIMID) 10 MG capsule, TAKE 1 CAPSULE BY MOUTH DAILY FOR 21 DAYS FOLLOWED BY 7 DAYS OFF, Disp: 21 capsule, Rfl: 0   NON FORMULARY, [place] - take 1g po daily, Disp: , Rfl:    rivaroxaban (XARELTO) 10 MG TABS tablet, Take 1 tablet (10 mg total) by mouth daily., Disp: 30 tablet, Rfl: 0   LABORATORY DATA:  I have reviewed the data as listed  . CBC Latest Ref Rng & Units 01/31/2019 10/21/2018 08/30/2018  WBC 4.0 - 10.5 K/uL 3.0(L) 5.7 3.0(L)  Hemoglobin 12.0 - 15.0 g/dL 13.6 12.7 11.8(L)  Hematocrit 36.0 - 46.0 % 42.1 39.1 35.9(L)  Platelets 150 - 400 K/uL 315 287 254    . CMP Latest Ref Rng & Units 01/31/2019 10/21/2018 08/30/2018  Glucose 70 - 99 mg/dL 75 112(H) 86  BUN 8 - 23 mg/dL _0 Creatinine 0.44 - 1.00 mg/dL 0.82 0.82 0.73  Sodium 135 - 145 mmol/L 142 140 141  Potassium 3.5 - 5.1 mmol/L 3.9 4.0 4.0  Chloride 98 - 111 mmol/L 106 106 109  CO2 22 - 32 mmol/L _1 Calcium 8.9 - 10.3 mg/dL 9.4 9.3 8.9  Total Protein 6.5 - 8.1 g/dL 7.9 7.8 6.9  Total Bilirubin 0.3 - 1.2 mg/dL 0.9 0.8 0.9  Alkaline Phos 38 - 126 U/L 77 80 73  AST 15 - 41 U/L _2 ALT 0 - 44 U/L _3 10/07/18 Repeat BM Bx from [facility]:    RADIOGRAPHIC STUDIES: I have personally reviewed the radiological images as listed and agreed with the findings in the  report. °No results found. ° °ASSESSMENT & PLAN:  ° °62 y.o. caucasian female with ° °1. H/o Multiple myeloma  - currently in remission. ° °IgA lambda R-ISS stage II multiple myeloma with innumerable lytic lesions in the calvarium, lesion in T7 vertebra and multiple lesions in the pelvis.  No significant bone pain at this time. °Diagnosed in January 2017. ° s/p treatment as noted above °10/07/18 BM Bx and Flow cytometry indicate that the pt continues to be in CR ° °2.Pre-op assessment for myeloma °Held Revlimid from 04/30/18 through her 05/14/18 uterine lift procedure and the bunionectomy on 06/25/18 to decrease risk of clots ° °PLAN:  °-Discussed pt labwork today, 01/31/19; ANC borderline at 1.5k, will continue to watch this. No anemia. PLT normal. °-01/31/19 MMP shows no M spike. Last available MMP from 10/21/18 did not observe an M spike °-The pt has no prohibitive toxicities from continuing 10mg Revlimid at this time. °-Pt continues in hematologic remission and discussed that as she has completed 2+ years of maintenance Revlimid, we could discuss either continuing this until progression vs holding this. Pt would like to continue with maintenance Revlimid at this time. °-Continue 81mg aspirin °-Continue with liberal lotion application, and begin Claritin  °-Could add Pepcid as well  °-Continue Zometa every 8 weeks  °-Will see the pt back in 2 months ° ° °Continue Zometa q8weeks- plz schedule next 3 doses °RTC with Dr Kale in 8 weeks with labs ° ° °The total time spent in the appt was 25 minutes and more than 50% was on counseling and direct patient cares. ° ° °Gautam Kale MD Paula AAHIVMS [facility] °Hematology/Oncology Physician ° [facility] ° °(Office):       [phone] °(Work cell):  [phone] °(Fax):           [phone] ° °I, Schuyler Bain, am acting as a scribe for Dr. Gautam Kale.  ° °.I have reviewed the above documentation for accuracy and completeness, and I agree with the above. °.Gautam Kishore Kale MD ° °

## 2019-01-31 ENCOUNTER — Other Ambulatory Visit: Payer: Self-pay

## 2019-01-31 ENCOUNTER — Inpatient Hospital Stay (HOSPITAL_BASED_OUTPATIENT_CLINIC_OR_DEPARTMENT_OTHER): Payer: BLUE CROSS/BLUE SHIELD | Admitting: Hematology

## 2019-01-31 ENCOUNTER — Telehealth: Payer: Self-pay | Admitting: Hematology

## 2019-01-31 ENCOUNTER — Inpatient Hospital Stay: Payer: BLUE CROSS/BLUE SHIELD | Attending: Hematology

## 2019-01-31 ENCOUNTER — Inpatient Hospital Stay: Payer: BLUE CROSS/BLUE SHIELD

## 2019-01-31 VITALS — BP 113/76 | HR 66 | Temp 98.0°F | Resp 18 | Ht 68.0 in | Wt 137.3 lb

## 2019-01-31 DIAGNOSIS — C9001 Multiple myeloma in remission: Secondary | ICD-10-CM

## 2019-01-31 DIAGNOSIS — Z79899 Other long term (current) drug therapy: Secondary | ICD-10-CM | POA: Insufficient documentation

## 2019-01-31 DIAGNOSIS — C9000 Multiple myeloma not having achieved remission: Secondary | ICD-10-CM

## 2019-01-31 DIAGNOSIS — Z7189 Other specified counseling: Secondary | ICD-10-CM

## 2019-01-31 DIAGNOSIS — Z7983 Long term (current) use of bisphosphonates: Secondary | ICD-10-CM

## 2019-01-31 LAB — CBC WITH DIFFERENTIAL/PLATELET
Abs Immature Granulocytes: 0.01 10*3/uL (ref 0.00–0.07)
Basophils Absolute: 0.1 10*3/uL (ref 0.0–0.1)
Basophils Relative: 2 %
Eosinophils Absolute: 0.2 10*3/uL (ref 0.0–0.5)
Eosinophils Relative: 5 %
HCT: 42.1 % (ref 36.0–46.0)
Hemoglobin: 13.6 g/dL (ref 12.0–15.0)
Immature Granulocytes: 0 %
Lymphocytes Relative: 25 %
Lymphs Abs: 0.7 10*3/uL (ref 0.7–4.0)
MCH: 30.2 pg (ref 26.0–34.0)
MCHC: 32.3 g/dL (ref 30.0–36.0)
MCV: 93.6 fL (ref 80.0–100.0)
Monocytes Absolute: 0.5 10*3/uL (ref 0.1–1.0)
Monocytes Relative: 17 %
Neutro Abs: 1.5 10*3/uL — ABNORMAL LOW (ref 1.7–7.7)
Neutrophils Relative %: 51 %
Platelets: 315 10*3/uL (ref 150–400)
RBC: 4.5 MIL/uL (ref 3.87–5.11)
RDW: 14.6 % (ref 11.5–15.5)
WBC: 3 10*3/uL — ABNORMAL LOW (ref 4.0–10.5)
nRBC: 0 % (ref 0.0–0.2)

## 2019-01-31 LAB — CMP (CANCER CENTER ONLY)
ALT: 25 U/L (ref 0–44)
AST: 19 U/L (ref 15–41)
Albumin: 3.9 g/dL (ref 3.5–5.0)
Alkaline Phosphatase: 77 U/L (ref 38–126)
Anion gap: 10 (ref 5–15)
BUN: 20 mg/dL (ref 8–23)
CO2: 26 mmol/L (ref 22–32)
Calcium: 9.4 mg/dL (ref 8.9–10.3)
Chloride: 106 mmol/L (ref 98–111)
Creatinine: 0.82 mg/dL (ref 0.44–1.00)
GFR, Est AFR Am: >60 mL/min (ref >60)
GFR, Estimated: >60 mL/min (ref >60)
Glucose, Bld: 75 mg/dL (ref 70–99)
Potassium: 3.9 mmol/L (ref 3.5–5.1)
Sodium: 142 mmol/L (ref 135–145)
Total Bilirubin: 0.9 mg/dL (ref 0.3–1.2)
Total Protein: 7.9 g/dL (ref 6.5–8.1)

## 2019-01-31 MED ORDER — SODIUM CHLORIDE 0.9 % IV SOLN
INTRAVENOUS | Status: DC
  Administered 2019-01-31: 10:00:00 via INTRAVENOUS
  Filled 2019-01-31: qty 250

## 2019-01-31 MED ORDER — ZOLEDRONIC ACID 4 MG/100ML IV SOLN
4.0000 mg | Freq: Once | INTRAVENOUS | Status: AC
  Administered 2019-01-31: 4 mg via INTRAVENOUS
  Filled 2019-01-31: qty 100

## 2019-01-31 NOTE — Patient Instructions (Signed)

## 2019-01-31 NOTE — Telephone Encounter (Signed)
Scheduled appt per 5/8 los. ° °A calendar will be mailed out. °

## 2019-02-03 LAB — MULTIPLE MYELOMA PANEL, SERUM
Albumin SerPl Elph-Mcnc: 3.6 g/dL (ref 2.9–4.4)
Albumin/Glob SerPl: 1.1 (ref 0.7–1.7)
Alpha 1: 0.2 g/dL (ref 0.0–0.4)
Alpha2 Glob SerPl Elph-Mcnc: 0.7 g/dL (ref 0.4–1.0)
B-Globulin SerPl Elph-Mcnc: 1.3 g/dL (ref 0.7–1.3)
Gamma Glob SerPl Elph-Mcnc: 1.1 g/dL (ref 0.4–1.8)
Globulin, Total: 3.3 g/dL (ref 2.2–3.9)
IgA: 444 mg/dL — ABNORMAL HIGH (ref 87–352)
IgG (Immunoglobin G), Serum: 1253 mg/dL (ref 586–1602)
IgM (Immunoglobulin M), Srm: 43 mg/dL (ref 26–217)
Total Protein ELP: 6.9 g/dL (ref 6.0–8.5)

## 2019-02-06 ENCOUNTER — Telehealth: Payer: Self-pay | Admitting: *Deleted

## 2019-02-06 NOTE — Telephone Encounter (Signed)
Contacted patient regarding test results per Dr. Grier Mitts directions: Please let patient know her myeloma panel showed no M spike suggesting continued complete remission.  Information given to patient - she verbalized understanding.

## 2019-02-10 ENCOUNTER — Other Ambulatory Visit: Payer: Self-pay | Admitting: Hematology

## 2019-02-11 ENCOUNTER — Other Ambulatory Visit: Payer: Self-pay | Admitting: *Deleted

## 2019-02-11 MED ORDER — LENALIDOMIDE 10 MG PO CAPS: ORAL_CAPSULE | ORAL | 0 refills | Status: DC

## 2019-02-11 NOTE — Telephone Encounter (Signed)
Refill Revlimid per OV note 01/30/2021. Refill sent to [place], [place] auth# [id number], 02/11/2019

## 2019-02-13 ENCOUNTER — Other Ambulatory Visit: Payer: Self-pay | Admitting: *Deleted

## 2019-02-13 MED ORDER — LENALIDOMIDE 10 MG PO CAPS: ORAL_CAPSULE | ORAL | 0 refills | Status: DC

## 2019-02-19 ENCOUNTER — Telehealth: Payer: Self-pay | Admitting: Hematology

## 2019-02-19 NOTE — Telephone Encounter (Signed)
Per patient rescheduled 10/823 appointments to 10/20 due to will be out of town.

## 2019-03-12 ENCOUNTER — Other Ambulatory Visit: Payer: Self-pay | Admitting: *Deleted

## 2019-03-12 MED ORDER — LENALIDOMIDE 10 MG PO CAPS: ORAL_CAPSULE | ORAL | 0 refills | Status: DC

## 2019-03-12 NOTE — Telephone Encounter (Signed)
Refill Revlimid as per Dr.Kale's OV note 01/31/2019. Refill sent to [facility], [place]. Fanny Dance #[id number], 03/12/2019.

## 2019-03-26 NOTE — Progress Notes (Signed)
HEMATOLOGY/ONCOLOGY CLINIC NOTE  Date of Service: 03/27/19  PCP: .[name], MD  CC:  F/u for continue mx of multiple myeloma  CHIEF COMPLAINTS/PURPOSE OF CONSULTATION:  Follow up for multiple myeloma  DIAGNOSIS  IgA lambda R-ISS stage II multiple myeloma with innumerable lytic lesions in the calvarium, lesion in T7 vertebra and multiple lesions in the pelvis.  No significant bone pain at this time. Diagnosed in January 2017.  Current treatment  Restarting on Revlimid 53m po daily 3 weeks on 1 week off (after treatment interruption)   Previous Treatment  Status post Vd x 1 cycle VRd x 6 cycles  HD Melphalan 2050mm2 on 03/22/2016 Autologous HSCT on 03/23/2016 with Dr [facility]Samule [facility] of 6.4 x10^6/kg)  -Revlimid maintenance     INTERVAL HISTORY:   [name] here for management and evaluation of her multiple myeloma. The patient's last visit with [place] on 01/31/19. The pt reports that she is doing well overall.  The pt reports that she has not developed any new concerns in the interim. She denies new skin rashes, new bone pains, fevers, chills, night sweats, unexpected weight loss, or new back pains. She does endorse some constipation and has begun using a laxative and has continued on calcium replacement.  Lab results today (03/27/19) of CBC w/diff and CMP is as follows: all values are WNL except for Creatinine at 1.12, GFR at 52. 03/27/19 MMP is pending  On review of systems, pt reports good energy levels, staying active, eating well, and denies fevers, chills, night sweats, unexpected weight loss, new bone pains, new back pains, abdominal pains, dental concerns, leg swelling, and any other symptoms.   REVIEW OF SYSTEMS:    A 10+ POINT REVIEW OF SYSTEMS WAS OBTAINED including neurology, dermatology, psychiatry, cardiac, respiratory, lymph, extremities, GI, GU, Musculoskeletal, constitutional, breasts, reproductive, HEENT.  All pertinent positives  are noted in the HPI.  All others are negative.   PHYSICAL EXAMINATION: NAD VS reviewed in [place] no acute distress and comfortable SKIN: no acute rashes, no significant lesions EYES: conjunctiva are pink and non-injected, sclera anicteric OROPHARYNX: MMM, no exudates, no oropharyngeal erythema or ulceration NECK: supple, no JVD LYMPH:  no palpable lymphadenopathy in the cervical, axillary or inguinal regions LUNGS: clear to auscultation b/l with normal respiratory effort HEART: regular rate & rhythm ABDOMEN:  normoactive bowel sounds , non tender, not distended. No palpable hepatosplenomegaly.  Extremity: no pedal edema PSYCH: alert & oriented x 3 with fluent speech NEURO: no focal motor/sensory deficits   MEDICAL HISTORY:   Past Medical History:  Diagnosis Date   Abnormal Pap smear of vagina 03/22/2017   ASCUS pap and negative HR HPV.  Patient is on immunosuppressive medication.    Anemia    AR (aortic regurgitation) 11/30/2017   trace noted on ECHO   Atrial septal aneurysm per 11-30-17 echo   trivial pericardial effusion   Cancer (HCC) 1999   squamous cell carcinoma of thymus   DDD (degenerative disc disease), cervical    DDD (degenerative disc disease), lumbosacral    Facet degeneration of lumbar region    Facet joint disease of cervical region    Fatigue    Fatty liver    Fibroid    reason for Hysterectomy   GERD (gastroesophageal reflux disease)    Grade I diastolic dysfunction    History of cystocele    History of stress incontinence    Hypergammaglobulinemia    Hyperlipidemia  Lung nodule    Migraine    episode of aphasia worked up with echo by [place] [facility] 11-23-17, has visual migraines   MR (mitral regurgitation) 11/30/2017   trace noted on ECHO   Multiple myeloma ([place]) 08/2015   metastatic to bone   Muscular fasciculation    Nasal septal deviation    Nasal turbinate hypertrophy    Near syncope     Peripheral edema    Plasma cell dyscrasia    PONV (postoperative nausea and vomiting)    thinks morphine caused nausea   Postoperative urinary retention 05/14/2018   Restless leg syndrome    Sleep apnea    uses old oral allialnce for osa due to cpap intolerance   TR (tricuspid regurgitation) 11/30/2017   trace noted on ECHO   Urinary incontinence    UTI (urinary tract infection)    Wears glasses     SURGICAL HISTORY: Past Surgical History:  Procedure Laterality Date   ABDOMINAL HYSTERECTOMY  1997   TAH--ovaries remain--Dr. Newton Pigg   ANTERIOR AND POSTERIOR REPAIR N/A 05/14/2018   Procedure: ANTERIOR (CYSTOCELE) AND POSTERIOR REPAIR (RECTOCELE);  Surgeon: [name], MD;  Location: [facility];  Service: Gynecology;  Laterality: N/A;   BLADDER SUSPENSION N/A 05/14/2018   Procedure: TRANSVAGINAL TAPE (TVT) PROCEDURE exact midurethral sling;  Surgeon: [name], MD;  [facility]: [facility];  Service: Gynecology;  Laterality: N/A;   BONE MARROW TRANSPLANT  2017   done at [place] N/A 05/14/2018   Procedure: CYSTOSCOPY;  Surgeon: [name], MD;  Location: [facility];  Service: Gynecology;  Laterality: N/A;   CYSTOSCOPY N/A 05/30/2018   Procedure: CYSTOSCOPY;  Surgeon: [name], MD;  Location: [facility];  Service: Gynecology;  Laterality: N/A;   ROBOTIC ASSISTED LAPAROSCOPIC SACROCOLPOPEXY N/A 05/14/2018   Procedure: XI ROBOTIC ASSISTED LAPAROSCOPIC SACROCOLPOPEXY;  Surgeon: [name], MD;  Location: [facility];  Service: Gynecology;  Laterality: N/A;  6 hours OR time. Need extended stay recovery bed.   SALPINGOOPHORECTOMY Bilateral 05/14/2018   Procedure: SALPINGO OOPHORECTOMY;  Surgeon: [name], MD;  [facility]: [facility];  Service: Gynecology;  Laterality: Bilateral;   THYMECTOMY  1999   TRANSVAGINAL TAPE (TVT) REMOVAL N/A 05/30/2018     Procedure: TRANSVAGINAL TAPE (TVT)  Revision;  Surgeon: [name], MD;  Location: [facility];  Service: Gynecology;  Laterality: N/A;   WISDOM TOOTH EXTRACTION      SOCIAL HISTORY: Social History   Socioeconomic History   Marital status: Married    Spouse name: Paula Johnson   Number of children: 2   Years of education: college   Highest education level: Not on file  Occupational History    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, [employer]  Social Needs   Financial resource strain: Not on file   Food insecurity    Worry: Not on file    Inability: Not on file   Transportation needs    Medical: Not on file    Non-medical: Not on file  Tobacco Use   Smoking status: Never Smoker   Smokeless tobacco: Never Used  Substance and Sexual Activity   Alcohol use: Yes    Alcohol/week: 0.0 standard drinks    Comment: once a year drink   Drug use: Never   Sexual activity: Not Currently  Partners: Male    Birth control/protection: Surgical    Comment: TAH--ovaries remain  Lifestyle   Physical activity    Days per week: Not on file    Minutes per session: Not on file   Stress: Not on file  Relationships   Social connections    Talks on phone: Not on file    Gets together: Not on file    Attends religious service: Not on file    Active member of club or organization: Not on file    Attends meetings of clubs or organizations: Not on file    Relationship status: Not on file   Intimate partner violence    Fear of current or ex partner: Not on file    Emotionally abused: Not on file    Physically abused: Not on file    Forced sexual activity: Not on file  Other Topics Concern   Not on file  Social History Narrative   Lives with husband   Caffeine- coffee, 2 cups daily    FAMILY HISTORY: Family History  Problem Relation Age of Onset   COPD Father        dec age 58/s-smoked   Macular degeneration Father    Diabetes Brother    Other  Brother        committed suicide age 58   Macular degeneration Brother    Hyperlipidemia Mother    Diabetes Maternal Grandmother    Heart attack Paternal Grandfather     ALLERGIES:  is allergic to ampicillin and penicillins.  MEDICATIONS:  . Current Outpatient Medications:    Calcium-Magnesium (CAL/MAG PO), Take 1 tablet by mouth 2 (two) times daily., Disp: , Rfl:    estradiol (ESTRACE VAGINAL) 0.1 MG/GM vaginal cream, Estrace 0.01% (0.1 mg/gram) vaginal cream, Disp: , Rfl:    lenalidomide (REVLIMID) 10 MG capsule, TAKE ONE CAPSULE BY MOUTH DAILY FOR 21 DAYS, THEN 7 DAYS OFF, Disp: 21 capsule, Rfl: 0   NON FORMULARY, [place] - take 1g po daily, Disp: , Rfl:    rivaroxaban (XARELTO) 10 MG TABS tablet, Take 1 tablet (10 mg total) by mouth daily., Disp: 30 tablet, Rfl: 0   LABORATORY DATA:  I have reviewed the data as listed  . CBC Latest Ref Rng & Units 03/27/2019 01/31/2019 10/21/2018  WBC 4.0 - 10.5 K/uL 4.1 3.0(L) 5.7  Hemoglobin 12.0 - 15.0 g/dL 12.5 13.6 12.7  Hematocrit 36.0 - 46.0 % 38.0 42.1 39.1  Platelets 150 - 400 K/uL 309 315 287    . CMP Latest Ref Rng & Units 03/27/2019 01/31/2019 10/21/2018  Glucose 70 - 99 mg/dL 92 75 112(H)  BUN 8 - 23 mg/dL _0 Creatinine 0.44 - 1.00 mg/dL 1.12(H) 0.82 0.82  Sodium 135 - 145 mmol/L 140 142 140  Potassium 3.5 - 5.1 mmol/L 3.8 3.9 4.0  Chloride 98 - 111 mmol/L 105 106 106  CO2 22 - 32 mmol/L _1 Calcium 8.9 - 10.3 mg/dL 9.5 9.4 9.3  Total Protein 6.5 - 8.1 g/dL 7.4 7.9 7.8  Total Bilirubin 0.3 - 1.2 mg/dL 0.6 0.9 0.8  Alkaline Phos 38 - 126 U/L 73 77 80  AST 15 - 41 U/L _2 ALT 0 - 44 U/L _3 10/07/18 Repeat BM Bx from [facility]:    RADIOGRAPHIC STUDIES: I have personally reviewed the radiological images as listed and agreed with the findings in the report. No results found.  ASSESSMENT & PLAN:  63 y.o. caucasian female with  1. H/o Multiple myeloma - currently in remission.  IgA lambda  R-ISS stage II multiple myeloma with innumerable lytic lesions in the calvarium, lesion in T7 vertebra and multiple lesions in the pelvis.  No significant bone pain at this time. Diagnosed in January 2017. s/p treatment as noted above 10/07/18 BM Bx and Flow cytometry indicate that the pt continues to be in CR  2.Pre-op assessment for myeloma Held Revlimid from 04/30/18 through her 05/14/18 uterine lift procedure and the bunionectomy on 06/25/18 to decrease risk of clots  PLAN:  -Discussed pt labwork today, 03/27/19; blood counts normalized. Chemistries are stable. -03/27/19 MMP is pending. Last available 01/31/19 MMP did not observe an M spike -Recommend taking Vitamin D without calcium to aide constipation. Miralax as well if needed. -The pt has no prohibitive toxicities from continuing 10m Revlimid at this time. -Will recommend repeating Prevnar and Pneumovax in 2021, then again after 5 years. Recommend annual flu vaccine and Shingrix as well -Pt continues in hematologic remission and discussed that as she has completed 2+ years of maintenance Revlimid,  Pt would like to continue with maintenance Revlimid at this time. -Continue 839maspirin -Continue Zometa every 8 weeks  -Recommended that the pt continue to eat well, drink at least 48-64 oz of water each day, and walk 20-30 minutes each day. -Will see the pt back in 2 months   RTC as per scheduled appointments on 05/23/2019   The total time spent in the appt was 25 minutes and more than 50% was on counseling and direct patient cares.   GaSullivan LoneD MSSouthchaseAHIVMS SCWestside Endoscopy CenterTCenter For Digestive Health LLCematology/Oncology Physician CoMountain West Medical Center(Office):       [phone] cell):  [phone]):           [phone], ScBaldwin Jamaicaam acting as a scribe for Dr. GaSullivan Lone  .I have reviewed the above documentation for accuracy and completeness, and I agree with the above. .GBrunetta GeneraD

## 2019-03-27 ENCOUNTER — Inpatient Hospital Stay: Payer: BC Managed Care – PPO | Attending: Hematology

## 2019-03-27 ENCOUNTER — Inpatient Hospital Stay: Payer: BC Managed Care – PPO

## 2019-03-27 ENCOUNTER — Inpatient Hospital Stay (HOSPITAL_BASED_OUTPATIENT_CLINIC_OR_DEPARTMENT_OTHER): Payer: BC Managed Care – PPO | Admitting: Hematology

## 2019-03-27 ENCOUNTER — Other Ambulatory Visit: Payer: Self-pay

## 2019-03-27 VITALS — BP 108/70 | HR 82 | Temp 98.7°F | Resp 18 | Ht 68.0 in | Wt 140.1 lb

## 2019-03-27 DIAGNOSIS — E785 Hyperlipidemia, unspecified: Secondary | ICD-10-CM | POA: Insufficient documentation

## 2019-03-27 DIAGNOSIS — D892 Hypergammaglobulinemia, unspecified: Secondary | ICD-10-CM | POA: Diagnosis not present

## 2019-03-27 DIAGNOSIS — K760 Fatty (change of) liver, not elsewhere classified: Secondary | ICD-10-CM | POA: Diagnosis not present

## 2019-03-27 DIAGNOSIS — C9001 Multiple myeloma in remission: Secondary | ICD-10-CM | POA: Insufficient documentation

## 2019-03-27 DIAGNOSIS — M5030 Other cervical disc degeneration, unspecified cervical region: Secondary | ICD-10-CM | POA: Diagnosis not present

## 2019-03-27 DIAGNOSIS — K219 Gastro-esophageal reflux disease without esophagitis: Secondary | ICD-10-CM | POA: Diagnosis not present

## 2019-03-27 DIAGNOSIS — M479 Spondylosis, unspecified: Secondary | ICD-10-CM | POA: Insufficient documentation

## 2019-03-27 DIAGNOSIS — I340 Nonrheumatic mitral (valve) insufficiency: Secondary | ICD-10-CM | POA: Insufficient documentation

## 2019-03-27 DIAGNOSIS — Z7189 Other specified counseling: Secondary | ICD-10-CM

## 2019-03-27 DIAGNOSIS — M5137 Other intervertebral disc degeneration, lumbosacral region: Secondary | ICD-10-CM | POA: Diagnosis not present

## 2019-03-27 DIAGNOSIS — E559 Vitamin D deficiency, unspecified: Secondary | ICD-10-CM

## 2019-03-27 DIAGNOSIS — I071 Rheumatic tricuspid insufficiency: Secondary | ICD-10-CM | POA: Insufficient documentation

## 2019-03-27 DIAGNOSIS — C9000 Multiple myeloma not having achieved remission: Secondary | ICD-10-CM

## 2019-03-27 LAB — CBC WITH DIFFERENTIAL/PLATELET
Abs Immature Granulocytes: 0.01 10*3/uL (ref 0.00–0.07)
Basophils Absolute: 0.1 10*3/uL (ref 0.0–0.1)
Basophils Relative: 2 %
Eosinophils Absolute: 0.2 10*3/uL (ref 0.0–0.5)
Eosinophils Relative: 5 %
HCT: 38 % (ref 36.0–46.0)
Hemoglobin: 12.5 g/dL (ref 12.0–15.0)
Immature Granulocytes: 0 %
Lymphocytes Relative: 22 %
Lymphs Abs: 0.9 10*3/uL (ref 0.7–4.0)
MCH: 30.6 pg (ref 26.0–34.0)
MCHC: 32.9 g/dL (ref 30.0–36.0)
MCV: 92.9 fL (ref 80.0–100.0)
Monocytes Absolute: 0.7 10*3/uL (ref 0.1–1.0)
Monocytes Relative: 17 %
Neutro Abs: 2.2 10*3/uL (ref 1.7–7.7)
Neutrophils Relative %: 54 %
Platelets: 309 10*3/uL (ref 150–400)
RBC: 4.09 MIL/uL (ref 3.87–5.11)
RDW: 14.2 % (ref 11.5–15.5)
WBC: 4.1 10*3/uL (ref 4.0–10.5)
nRBC: 0 % (ref 0.0–0.2)

## 2019-03-27 LAB — CMP (CANCER CENTER ONLY)
ALT: 22 U/L (ref 0–44)
AST: 17 U/L (ref 15–41)
Albumin: 3.8 g/dL (ref 3.5–5.0)
Alkaline Phosphatase: 73 U/L (ref 38–126)
Anion gap: 11 (ref 5–15)
BUN: 20 mg/dL (ref 8–23)
CO2: 24 mmol/L (ref 22–32)
Calcium: 9.5 mg/dL (ref 8.9–10.3)
Chloride: 105 mmol/L (ref 98–111)
Creatinine: 1.12 mg/dL — ABNORMAL HIGH (ref 0.44–1.00)
GFR, Est AFR Am: >60 mL/min (ref >60)
GFR, Estimated: 52 mL/min — ABNORMAL LOW (ref >60)
Glucose, Bld: 92 mg/dL (ref 70–99)
Potassium: 3.8 mmol/L (ref 3.5–5.1)
Sodium: 140 mmol/L (ref 135–145)
Total Bilirubin: 0.6 mg/dL (ref 0.3–1.2)
Total Protein: 7.4 g/dL (ref 6.5–8.1)

## 2019-03-27 MED ORDER — ALTEPLASE 2 MG IJ SOLR
2.0000 mg | Freq: Once | INTRAMUSCULAR | Status: DC | PRN
  Filled 2019-03-27: qty 2

## 2019-03-27 MED ORDER — HEPARIN SOD (PORK) LOCK FLUSH 100 UNIT/ML IV SOLN
500.0000 [IU] | Freq: Once | INTRAVENOUS | Status: DC | PRN
  Filled 2019-03-27: qty 5

## 2019-03-27 MED ORDER — HEPARIN SOD (PORK) LOCK FLUSH 100 UNIT/ML IV SOLN
250.0000 [IU] | Freq: Once | INTRAVENOUS | Status: DC | PRN
  Filled 2019-03-27: qty 5

## 2019-03-27 MED ORDER — ZOLEDRONIC ACID 4 MG/5ML IV CONC: 4.0000 mg | Freq: Once | INTRAVENOUS | Status: DC

## 2019-03-27 MED ORDER — SODIUM CHLORIDE 0.9% FLUSH
3.0000 mL | Freq: Once | INTRAVENOUS | Status: DC | PRN
  Filled 2019-03-27: qty 10

## 2019-03-27 MED ORDER — SODIUM CHLORIDE 0.9% FLUSH
10.0000 mL | INTRAVENOUS | Status: DC | PRN
  Filled 2019-03-27: qty 10

## 2019-03-27 MED ORDER — ZOLEDRONIC ACID 4 MG/100ML IV SOLN
4.0000 mg | Freq: Once | INTRAVENOUS | Status: AC
  Administered 2019-03-27: 4 mg via INTRAVENOUS
  Filled 2019-03-27: qty 100

## 2019-03-27 NOTE — Patient Instructions (Signed)
[facility] Discharge Instructions for Patients Receiving Chemotherapy  Today you received the following agent: Zometa  To help prevent nausea and vomiting after your treatment, we encourage you to take your nausea medication as directed by your MD.   If you develop nausea and vomiting that is not controlled by your nausea medication, call the clinic.   BELOW ARE SYMPTOMS THAT SHOULD BE REPORTED IMMEDIATELY:  *FEVER GREATER THAN 100.5 F  *CHILLS WITH OR WITHOUT FEVER  NAUSEA AND VOMITING THAT IS NOT CONTROLLED WITH YOUR NAUSEA MEDICATION  *UNUSUAL SHORTNESS OF BREATH  *UNUSUAL BRUISING OR BLEEDING  TENDERNESS IN MOUTH AND THROAT WITH OR WITHOUT PRESENCE OF ULCERS  *URINARY PROBLEMS  *BOWEL PROBLEMS  UNUSUAL RASH Items with * indicate a potential emergency and should be followed up as soon as possible.  Feel free to call the clinic should you have any questions or concerns. The clinic phone number is [phone].  Please show the [facility] at check-in to the Emergency Department and triage nurse.  Coronavirus (COVID-19) Are you at risk?  Are you at risk for the Coronavirus (COVID-19)?  To be considered HIGH RISK for Coronavirus (COVID-19), you have to meet the following criteria:  . Traveled to [place], [place], [place], [place] or [place]; or in the [place] to [place], [place], [place], or [place]; and have fever, cough, and shortness of breath within the last 2 weeks of travel OR . Been in close contact with a person diagnosed with COVID-19 within the last 2 weeks and have fever, cough, and shortness of breath . IF YOU DO NOT MEET THESE CRITERIA, YOU ARE CONSIDERED LOW RISK FOR COVID-19.  What to do if you are HIGH RISK for COVID-19?  Marland Kitchen If you are having a medical emergency, call 911. . Seek medical care right away. Before you go to a doctor's office, urgent care or emergency department, call ahead and tell them about your recent  travel, contact with someone diagnosed with COVID-19, and your symptoms. You should receive instructions from your physician's office regarding next steps of care.  . When you arrive at healthcare provider, tell the healthcare staff immediately you have returned from visiting [place], [place], [place], [place] or [place]; or traveled in the [place] to [place], [place], [place], or [place]; in the last two weeks or you have been in close contact with a person diagnosed with COVID-19 in the last 2 weeks.   . Tell the health care staff about your symptoms: fever, cough and shortness of breath. . After you have been seen by a medical provider, you will be either: o Tested for (COVID-19) and discharged home on quarantine except to seek medical care if symptoms worsen, and asked to  - Stay home and avoid contact with others until you get your results (4-5 days)  - Avoid travel on public transportation if possible (such as bus, train, or airplane) or o Sent to the Emergency Department by EMS for evaluation, COVID-19 testing, and possible admission depending on your condition and test results.  What to do if you are LOW RISK for COVID-19?  Reduce your risk of any infection by using the same precautions used for avoiding the common cold or flu:  Marland Kitchen Wash your hands often with soap and warm water for at least 20 seconds.  If soap and water are not readily available, use an alcohol-based hand sanitizer with at least 60% alcohol.  . If coughing  or sneezing, cover your mouth and nose by coughing or sneezing into the elbow areas of your shirt or coat, into a tissue or into your sleeve (not your hands). . Avoid shaking hands with others and consider head nods or verbal greetings only. . Avoid touching your eyes, nose, or mouth with unwashed hands.  . Avoid close contact with people who are sick. . Avoid places or events with large numbers of people in one location, like concerts or sporting  events. . Carefully consider travel plans you have or are making. . If you are planning any travel outside or inside the [place], visit the CDC's Travelers' Health webpage for the latest health notices. . If you have some symptoms but not all symptoms, continue to monitor at home and seek medical attention if your symptoms worsen. . If you are having a medical emergency, call 911.   ADDITIONAL HEALTHCARE OPTIONS FOR PATIENTS  [facility] / e-Visit: [email or web address]         [facility]: [phone]  [facility]: [phone]                   [facility]: [phone]    

## 2019-03-31 ENCOUNTER — Telehealth: Payer: Self-pay | Admitting: Hematology

## 2019-03-31 LAB — MULTIPLE MYELOMA PANEL, SERUM
Albumin SerPl Elph-Mcnc: 3.8 g/dL (ref 2.9–4.4)
Albumin/Glob SerPl: 1.4 (ref 0.7–1.7)
Alpha 1: 0.2 g/dL (ref 0.0–0.4)
Alpha2 Glob SerPl Elph-Mcnc: 0.6 g/dL (ref 0.4–1.0)
B-Globulin SerPl Elph-Mcnc: 1 g/dL (ref 0.7–1.3)
Gamma Glob SerPl Elph-Mcnc: 1.1 g/dL (ref 0.4–1.8)
Globulin, Total: 2.9 g/dL (ref 2.2–3.9)
IgA: 424 mg/dL — ABNORMAL HIGH (ref 87–352)
IgG (Immunoglobin G), Serum: 1254 mg/dL (ref 586–1602)
IgM (Immunoglobulin M), Srm: 40 mg/dL (ref 26–217)
Total Protein ELP: 6.7 g/dL (ref 6.0–8.5)

## 2019-03-31 NOTE — Telephone Encounter (Signed)
Per 7/2 los RTC as per scheduled appointments on 05/23/2019.

## 2019-04-08 ENCOUNTER — Other Ambulatory Visit: Payer: Self-pay | Admitting: Hematology

## 2019-04-08 ENCOUNTER — Other Ambulatory Visit: Payer: Self-pay | Admitting: *Deleted

## 2019-04-08 MED ORDER — LENALIDOMIDE 10 MG PO CAPS: ORAL_CAPSULE | ORAL | 0 refills | Status: DC

## 2019-04-08 NOTE — Telephone Encounter (Signed)
Refilled Revlimid per [name] OV note 03/27/2019. Refill sent to [facility], [place] Auth# [id number], 04/08/2019

## 2019-05-07 ENCOUNTER — Other Ambulatory Visit: Payer: Self-pay | Admitting: Hematology

## 2019-05-08 ENCOUNTER — Other Ambulatory Visit: Payer: Self-pay | Admitting: *Deleted

## 2019-05-08 MED ORDER — LENALIDOMIDE 10 MG PO CAPS: ORAL_CAPSULE | ORAL | 0 refills | Status: DC

## 2019-05-08 NOTE — Telephone Encounter (Signed)
Refilled Revlimid per [name] OV note 03/27/2019 Refill sent to [facility], [place] # [id number], 05/08/2019

## 2019-05-21 ENCOUNTER — Telehealth: Payer: Self-pay | Admitting: *Deleted

## 2019-05-21 NOTE — Telephone Encounter (Signed)
Patient left voice mail asking if she could have a flu shot when she came to [facility] on 8/28 for MD appt and infusion. Per [name], he will order flu shot for patient. Schedule message sent to add to appointments. Attempted to contact patient with this information, left voice mail on named voice mail.

## 2019-05-22 NOTE — Progress Notes (Signed)
HEMATOLOGY/ONCOLOGY CLINIC NOTE  Date of Service: 05/23/19  PCP: .[name], MD  CC:  F/u for continue mx of multiple myeloma  CHIEF COMPLAINTS/PURPOSE OF CONSULTATION:  Follow up for multiple myeloma  DIAGNOSIS  IgA lambda R-ISS stage II multiple myeloma with innumerable lytic lesions in the calvarium, lesion in T7 vertebra and multiple lesions in the pelvis.  No significant bone pain at this time. Diagnosed in January 2017.  Current treatment  Restarting on Revlimid 45m po daily 3 weeks on 1 week off (after treatment interruption)   Previous Treatment  Status post Vd x 1 cycle VRd x 6 cycles  HD Melphalan 2058mm2 on 03/22/2016 Autologous HSCT on 03/23/2016 with Dr Ga[facility]ule [facility] 6.4 x10^6/kg)  -Revlimid maintenance     INTERVAL HISTORY:  Ms CoCrydermans here for management and evaluation of her multiple myeloma. The patient's last visit with [place] on 03/27/2019. The pt reports that she is doing well overall.  The pt reports that he has been keeping busy at home. She did talk to her PCP about the shingles vaccine and they would like to hold off on that for now until COVID-19 is more controlled as it can cause flu like symptoms.   The pt has no prohibitive toxicities from continuing Revlimid at this time.  Lab results today (05/23/19) of CBC w/diff and CMP is as follows: all values are WNL except for WBC at 3.0K, Neutrophils at 1.6K, . 05/23/19 MMP pending. 03/27/2019 MMP shows IgA at 424. 05/23/19 25 OH Vitamin D 31.7  On review of systems, pt denies bone pain, fevers, chills, night sweats, and any other symptoms.    REVIEW OF SYSTEMS:   A 10+ POINT REVIEW OF SYSTEMS WAS OBTAINED including neurology, dermatology, psychiatry, cardiac, respiratory, lymph, extremities, GI, GU, Musculoskeletal, constitutional, breasts, reproductive, HEENT.  All pertinent positives are noted in the HPI.  All others are negative.     PHYSICAL  EXAMINATION: NAD .BP 113/72 (BP Location: Left Arm, Patient Position: Sitting)    Pulse 86    Temp 98.3 F (36.8 C) (Temporal)    Resp 18    Ht _0  (1.727 m)    Wt 138 lb (62.6 kg)    LMP 09/26/1995 (Within Months)    SpO2 98%    BMI 20.98 kg/m   GENERAL:alert, in no acute distress and comfortable SKIN: no acute rashes, no significant lesions EYES: conjunctiva are pink and non-injected, sclera anicteric OROPHARYNX: MMM, no exudates, no oropharyngeal erythema or ulceration NECK: supple, no JVD LYMPH:  no palpable lymphadenopathy in the cervical, axillary or inguinal regions LUNGS: clear to auscultation b/l with normal respiratory effort HEART: regular rate & rhythm ABDOMEN:  normoactive bowel sounds , non tender, not distended. Extremity: no pedal edema PSYCH: alert & oriented x 3 with fluent speech NEURO: no focal motor/sensory deficits    MEDICAL HISTORY:   Past Medical History:  Diagnosis Date   Abnormal Pap smear of vagina 03/22/2017   ASCUS pap and negative HR HPV.  Patient is on immunosuppressive medication.    Anemia    AR (aortic regurgitation) 11/30/2017   trace noted on ECHO   Atrial septal aneurysm per 11-30-17 echo   trivial pericardial effusion   Cancer (HCC) 1999   squamous cell carcinoma of thymus   DDD (degenerative disc disease), cervical    DDD (degenerative disc disease), lumbosacral    Facet degeneration of lumbar region    Facet joint disease of cervical region  Fatigue    Fatty liver    Fibroid    reason for Hysterectomy   GERD (gastroesophageal reflux disease)    Grade I diastolic dysfunction    History of cystocele    History of stress incontinence    Hypergammaglobulinemia    Hyperlipidemia    Lung nodule    Migraine    episode of aphasia worked up with echo by [facility] 11-23-17, has visual migraines   MR (mitral regurgitation) 11/30/2017   trace noted on ECHO   Multiple myeloma ([facility]) 08/2015   metastatic to  bone   Muscular fasciculation    Nasal septal deviation    Nasal turbinate hypertrophy    Near syncope    Peripheral edema    Plasma cell dyscrasia    PONV (postoperative nausea and vomiting)    thinks morphine caused nausea   Postoperative urinary retention 05/14/2018   Restless leg syndrome    Sleep apnea    uses old oral allialnce for osa due to cpap intolerance   TR (tricuspid regurgitation) 11/30/2017   trace noted on ECHO   Urinary incontinence    UTI (urinary tract infection)    Wears glasses     SURGICAL HISTORY: Past Surgical History:  Procedure Laterality Date   ABDOMINAL HYSTERECTOMY  1997   TAH--ovaries remain--Dr. Newton Pigg   ANTERIOR AND POSTERIOR REPAIR N/A 05/14/2018   Procedure: ANTERIOR (CYSTOCELE) AND POSTERIOR REPAIR (RECTOCELE);  Surgeon: [name], MD;  Location: [facility];  Service: Gynecology;  Laterality: N/A;   BLADDER SUSPENSION N/A 05/14/2018   Procedure: TRANSVAGINAL TAPE (TVT) PROCEDURE exact midurethral sling;  Surgeon: [name], MD;  Location: [facility];  Service: Gynecology;  Laterality: N/A;   BONE MARROW TRANSPLANT  2017   done at [facility] N/A 05/14/2018   Procedure: CYSTOSCOPY;  Surgeon: [name], MD;  Location: [facility];  Service: Gynecology;  Laterality: N/A;   CYSTOSCOPY N/A 05/30/2018   Procedure: CYSTOSCOPY;  Surgeon: [name], MD;  Location: [facility];  Service: Gynecology;  Laterality: N/A;   ROBOTIC ASSISTED LAPAROSCOPIC SACROCOLPOPEXY N/A 05/14/2018   Procedure: XI ROBOTIC ASSISTED LAPAROSCOPIC SACROCOLPOPEXY;  Surgeon: [name], MD;  Location: [facility];  Service: Gynecology;  Laterality: N/A;  6 hours OR time. Need extended stay recovery bed.   SALPINGOOPHORECTOMY Bilateral 05/14/2018   Procedure: SALPINGO OOPHORECTOMY;  Surgeon: [name], MD;  Location: [facility];   Service: Gynecology;  Laterality: Bilateral;   THYMECTOMY  1999   TRANSVAGINAL TAPE (TVT) REMOVAL N/A 05/30/2018   Procedure: TRANSVAGINAL TAPE (TVT)  Revision;  Surgeon: [name], MD;  Location: [facility];  Service: Gynecology;  Laterality: N/A;   WISDOM TOOTH EXTRACTION      SOCIAL HISTORY: Social History   Socioeconomic History   Marital status: Married    Spouse name: Charles   Number of children: 2   Years of education: college   Highest education level: Not on file  Occupational History    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, Lab  Social Needs   Financial resource strain: Not on file   Food insecurity    Worry: Not on file    Inability: Not on file   Transportation needs    Medical: Not on file    Non-medical: Not on file  Tobacco Use   Smoking status: Never Smoker   Smokeless tobacco: Never Used  Substance and Sexual Activity   Alcohol use: Yes    Alcohol/week: 0.0 standard drinks    Comment: once a year drink   Drug use: Never   Sexual activity: Not Currently    Partners: Male    Birth control/protection: Surgical    Comment: TAH--ovaries remain  Lifestyle   Physical activity    Days per week: Not on file    Minutes per session: Not on file   Stress: Not on file  Relationships   Social connections    Talks on phone: Not on file    Gets together: Not on file    Attends religious service: Not on file    Active member of club or organization: Not on file    Attends meetings of clubs or organizations: Not on file    Relationship status: Not on file   Intimate partner violence    Fear of current or ex partner: Not on file    Emotionally abused: Not on file    Physically abused: Not on file    Forced sexual activity: Not on file  Other Topics Concern   Not on file  Social History Narrative   Lives with husband   Caffeine- coffee, 2 cups daily    FAMILY HISTORY: Family History  Problem Relation Age of  Onset   COPD Father        dec age 12/s-smoked   Macular degeneration Father    Diabetes Brother    Other Brother        committed suicide age 22   Macular degeneration Brother    Hyperlipidemia Mother    Diabetes Maternal Grandmother    Heart attack Paternal Grandfather     ALLERGIES:  is allergic to ampicillin and penicillins.  MEDICATIONS:  . Current Outpatient Medications:    Calcium-Magnesium (CAL/MAG PO), Take 1 tablet by mouth 2 (two) times daily., Disp: , Rfl:    estradiol (ESTRACE VAGINAL) 0.1 MG/GM vaginal cream, Estrace 0.01% (0.1 mg/gram) vaginal cream, Disp: , Rfl:    lenalidomide (REVLIMID) 10 MG capsule, TAKE ONE CAPSULE BY MOUTH DAILY FOR 21 DAYS, THEN 7 DAYS OFF, Disp: 21 capsule, Rfl: 0   NON FORMULARY, [place] - take 1g po daily, Disp: , Rfl:    rivaroxaban (XARELTO) 10 MG TABS tablet, Take 1 tablet (10 mg total) by mouth daily., Disp: 30 tablet, Rfl: 0   LABORATORY DATA:  I have reviewed the data as listed  . CBC Latest Ref Rng & Units 05/23/2019 03/27/2019 01/31/2019  WBC 4.0 - 10.5 K/uL 3.0(L) 4.1 3.0(L)  Hemoglobin 12.0 - 15.0 g/dL 12.8 12.5 13.6  Hematocrit 36.0 - 46.0 % 39.1 38.0 42.1  Platelets 150 - 400 K/uL 339 309 315    . CMP Latest Ref Rng & Units 05/23/2019 03/27/2019 01/31/2019  Glucose 70 - 99 mg/dL 97 92 75  BUN 8 - 23 mg/dL _0 Creatinine 0.44 - 1.00 mg/dL 0.78 1.12(H) 0.82  Sodium 135 - 145 mmol/L 139 140 142  Potassium 3.5 - 5.1 mmol/L 4.1 3.8 3.9  Chloride 98 - 111 mmol/L 107 105 106  CO2 22 - 32 mmol/L _1 Calcium 8.9 - 10.3 mg/dL 9.1 9.5 9.4  Total Protein 6.5 - 8.1 g/dL 7.4 7.4 7.9  Total Bilirubin 0.3 - 1.2 mg/dL 0.8 0.6 0.9  Alkaline Phos 38 - 126 U/L 77 73 77  AST  15 - 41 U/L _0 ALT 0 - 44 U/L _1 10/07/18 Repeat BM Bx from [facility]:    RADIOGRAPHIC STUDIES: I have personally reviewed the radiological images as listed and agreed with the findings in the report. No results  found.  ASSESSMENT & PLAN:   63 y.o. caucasian female with  1. H/o Multiple myeloma - currently in remission.  IgA lambda R-ISS stage II multiple myeloma with innumerable lytic lesions in the calvarium, lesion in T7 vertebra and multiple lesions in the pelvis.  No significant bone pain at this time. Diagnosed in January 2017. s/p treatment as noted above 10/07/18 BM Bx and Flow cytometry indicate that the pt continues to be in CR  2.Pre-op assessment for myeloma Held Revlimid from 04/30/18 through her 05/14/18 uterine lift procedure and the bunionectomy on 06/25/18 to decrease risk of clots    PLAN:  -Discussed pt labwork today, 05/23/19; all values are WNL except for WBC at 3.0K, Neutrophils at 1.6K. 05/23/19 MMP pending. 03/27/2019 MMP shows IgA at 424. 05/23/19 25 OH Vitamin D is 31.7-- Would start on Ergocalciferol 50k units weekly to optimize and target levels of around 60.  -Flu shot due today -The pt has no prohibitive toxicities from continuing Zometa or maintenance Lenalidomide at this time. -continue ASA for VTE prophylaxis -Return in 2 months   FOLLOW UP: Flu shot today Changing Zometa to q12weeks from next dose (plz change infusion appointment) x 4 RTC with Dr Irene Limbo with labs in 2 months    The total time spent in the appt was 20 minutes and more than 50% was on counseling and direct patient cares.  All of the patient's questions were answered with apparent satisfaction. The patient knows to call the clinic with any problems, questions or concerns.    Sullivan Lone MD MS AAHIVMS [facility] Hematology/Oncology Physician [facility]  (Office):       [phone] (Work cell):  [phone] (Fax):           [phone]  I, Jacqualyn Posey, am acting as a scribe for Dr. Sullivan Lone.   .I have reviewed the above documentation for accuracy and completeness, and I agree with the above. Brunetta Genera MD

## 2019-05-23 ENCOUNTER — Inpatient Hospital Stay: Payer: BC Managed Care – PPO | Attending: Hematology

## 2019-05-23 ENCOUNTER — Inpatient Hospital Stay: Payer: BC Managed Care – PPO

## 2019-05-23 ENCOUNTER — Telehealth: Payer: Self-pay | Admitting: Hematology

## 2019-05-23 ENCOUNTER — Other Ambulatory Visit: Payer: Self-pay

## 2019-05-23 ENCOUNTER — Inpatient Hospital Stay (HOSPITAL_BASED_OUTPATIENT_CLINIC_OR_DEPARTMENT_OTHER): Payer: BC Managed Care – PPO | Admitting: Hematology

## 2019-05-23 VITALS — BP 113/72 | HR 86 | Temp 98.3°F | Resp 18 | Ht 68.0 in | Wt 138.0 lb

## 2019-05-23 DIAGNOSIS — M5137 Other intervertebral disc degeneration, lumbosacral region: Secondary | ICD-10-CM | POA: Insufficient documentation

## 2019-05-23 DIAGNOSIS — M5030 Other cervical disc degeneration, unspecified cervical region: Secondary | ICD-10-CM | POA: Diagnosis not present

## 2019-05-23 DIAGNOSIS — I340 Nonrheumatic mitral (valve) insufficiency: Secondary | ICD-10-CM | POA: Insufficient documentation

## 2019-05-23 DIAGNOSIS — C9000 Multiple myeloma not having achieved remission: Secondary | ICD-10-CM

## 2019-05-23 DIAGNOSIS — K219 Gastro-esophageal reflux disease without esophagitis: Secondary | ICD-10-CM | POA: Diagnosis not present

## 2019-05-23 DIAGNOSIS — I071 Rheumatic tricuspid insufficiency: Secondary | ICD-10-CM | POA: Insufficient documentation

## 2019-05-23 DIAGNOSIS — Z23 Encounter for immunization: Secondary | ICD-10-CM | POA: Diagnosis not present

## 2019-05-23 DIAGNOSIS — C9001 Multiple myeloma in remission: Secondary | ICD-10-CM | POA: Diagnosis not present

## 2019-05-23 DIAGNOSIS — Z7189 Other specified counseling: Secondary | ICD-10-CM

## 2019-05-23 DIAGNOSIS — E785 Hyperlipidemia, unspecified: Secondary | ICD-10-CM | POA: Diagnosis not present

## 2019-05-23 DIAGNOSIS — M479 Spondylosis, unspecified: Secondary | ICD-10-CM | POA: Diagnosis not present

## 2019-05-23 DIAGNOSIS — K760 Fatty (change of) liver, not elsewhere classified: Secondary | ICD-10-CM | POA: Insufficient documentation

## 2019-05-23 DIAGNOSIS — E559 Vitamin D deficiency, unspecified: Secondary | ICD-10-CM

## 2019-05-23 DIAGNOSIS — D892 Hypergammaglobulinemia, unspecified: Secondary | ICD-10-CM | POA: Insufficient documentation

## 2019-05-23 LAB — CMP (CANCER CENTER ONLY)
ALT: 31 U/L (ref 0–44)
AST: 22 U/L (ref 15–41)
Albumin: 3.7 g/dL (ref 3.5–5.0)
Alkaline Phosphatase: 77 U/L (ref 38–126)
Anion gap: 8 (ref 5–15)
BUN: 18 mg/dL (ref 8–23)
CO2: 24 mmol/L (ref 22–32)
Calcium: 9.1 mg/dL (ref 8.9–10.3)
Chloride: 107 mmol/L (ref 98–111)
Creatinine: 0.78 mg/dL (ref 0.44–1.00)
GFR, Est AFR Am: >60 mL/min (ref >60)
GFR, Estimated: >60 mL/min (ref >60)
Glucose, Bld: 97 mg/dL (ref 70–99)
Potassium: 4.1 mmol/L (ref 3.5–5.1)
Sodium: 139 mmol/L (ref 135–145)
Total Bilirubin: 0.8 mg/dL (ref 0.3–1.2)
Total Protein: 7.4 g/dL (ref 6.5–8.1)

## 2019-05-23 LAB — CBC WITH DIFFERENTIAL/PLATELET
Abs Immature Granulocytes: 0.01 10*3/uL (ref 0.00–0.07)
Basophils Absolute: 0.1 10*3/uL (ref 0.0–0.1)
Basophils Relative: 2 %
Eosinophils Absolute: 0.2 10*3/uL (ref 0.0–0.5)
Eosinophils Relative: 6 %
HCT: 39.1 % (ref 36.0–46.0)
Hemoglobin: 12.8 g/dL (ref 12.0–15.0)
Immature Granulocytes: 0 %
Lymphocytes Relative: 24 %
Lymphs Abs: 0.7 10*3/uL (ref 0.7–4.0)
MCH: 30.7 pg (ref 26.0–34.0)
MCHC: 32.7 g/dL (ref 30.0–36.0)
MCV: 93.8 fL (ref 80.0–100.0)
Monocytes Absolute: 0.4 10*3/uL (ref 0.1–1.0)
Monocytes Relative: 13 %
Neutro Abs: 1.6 10*3/uL — ABNORMAL LOW (ref 1.7–7.7)
Neutrophils Relative %: 55 %
Platelets: 339 10*3/uL (ref 150–400)
RBC: 4.17 MIL/uL (ref 3.87–5.11)
RDW: 14.5 % (ref 11.5–15.5)
WBC: 3 10*3/uL — ABNORMAL LOW (ref 4.0–10.5)
nRBC: 0 % (ref 0.0–0.2)

## 2019-05-23 MED ORDER — SODIUM CHLORIDE 0.9 % IV SOLN
INTRAVENOUS | Status: DC
  Administered 2019-05-23: 12:00:00 via INTRAVENOUS
  Filled 2019-05-23: qty 250

## 2019-05-23 MED ORDER — INFLUENZA VAC SPLIT QUAD 0.5 ML IM SUSY
0.5000 mL | PREFILLED_SYRINGE | Freq: Once | INTRAMUSCULAR | Status: AC
  Administered 2019-05-23: 0.5 mL via INTRAMUSCULAR
  Filled 2019-05-23: qty 0.5

## 2019-05-23 MED ORDER — ZOLEDRONIC ACID 4 MG/100ML IV SOLN
4.0000 mg | Freq: Once | INTRAVENOUS | Status: AC
  Administered 2019-05-23: 4 mg via INTRAVENOUS
  Filled 2019-05-23: qty 100

## 2019-05-23 MED ORDER — INFLUENZA VAC SPLIT QUAD 0.5 ML IM SUSY: 0.5000 mL | PREFILLED_SYRINGE | Freq: Once | INTRAMUSCULAR | Status: DC

## 2019-05-23 NOTE — Patient Instructions (Signed)
Influenza (Flu) Vaccine (Inactivated or Recombinant): What You Need to Know 1. Why get vaccinated? Influenza vaccine can prevent influenza (flu). Flu is a contagious disease that spreads around the [place] every year, usually between October and May. Anyone can get the flu, but it is more dangerous for some people. Infants and young children, people 65 years of age and older, pregnant women, and people with certain health conditions or a weakened immune system are at greatest risk of flu complications. Pneumonia, bronchitis, sinus infections and ear infections are examples of flu-related complications. If you have a medical condition, such as heart disease, cancer or diabetes, flu can make it worse. Flu can cause fever and chills, sore throat, muscle aches, fatigue, cough, headache, and runny or stuffy nose. Some people may have vomiting and diarrhea, though this is more common in children than adults. Each year thousands of people in the [place] die from flu, and many more are hospitalized. Flu vaccine prevents millions of illnesses and flu-related visits to the doctor each year. 2. Influenza vaccine CDC recommends everyone 57 months of age and older get vaccinated every flu season. Children 6 months through 2 years of age may need 2 doses during a single flu season. Everyone else needs only 1 dose each flu season. It takes about 2 weeks for protection to develop after vaccination. There are many flu viruses, and they are always changing. Each year a new flu vaccine is made to protect against three or four viruses that are likely to cause disease in the upcoming flu season. Even when the vaccine doesn't exactly match these viruses, it may still provide some protection. Influenza vaccine does not cause flu. Influenza vaccine may be given at the same time as other vaccines. 3. Talk with your health care provider Tell your vaccine provider if the person getting the vaccine:  Has had an  allergic reaction after a previous dose of influenza vaccine, or has any severe, life-threatening allergies.  Has ever had Guillain-Barr Syndrome (also called GBS). In some cases, your health care provider may decide to postpone influenza vaccination to a future visit. People with minor illnesses, such as a cold, may be vaccinated. People who are moderately or severely ill should usually wait until they recover before getting influenza vaccine. Your health care provider can give you more information. 4. Risks of a vaccine reaction  Soreness, redness, and swelling where shot is given, fever, muscle aches, and headache can happen after influenza vaccine.  There may be a very small increased risk of Guillain-Barr Syndrome (GBS) after inactivated influenza vaccine (the flu shot). Young children who get the flu shot along with pneumococcal vaccine (PCV13), and/or DTaP vaccine at the same time might be slightly more likely to have a seizure caused by fever. Tell your health care provider if a child who is getting flu vaccine has ever had a seizure. People sometimes faint after medical procedures, including vaccination. Tell your provider if you feel dizzy or have vision changes or ringing in the ears. As with any medicine, there is a very remote chance of a vaccine causing a severe allergic reaction, other serious injury, or death. 5. What if there is a serious problem? An allergic reaction could occur after the vaccinated person leaves the clinic. If you see signs of a severe allergic reaction (hives, swelling of the face and throat, difficulty breathing, a fast heartbeat, dizziness, or weakness), call 9-1-1 and get the person to the nearest hospital. For other signs that  concern you, call your health care provider. Adverse reactions should be reported to the Vaccine Adverse Event Reporting System (VAERS). Your health care provider will usually file this report, or you can do it yourself. Visit the  VAERS website at www.vaers.SamedayNews.es or call 231-631-8063.VAERS is only for reporting reactions, and VAERS staff do not give medical advice. 6. The National Vaccine Injury Compensation Program The [employer] Vaccine Injury Compensation Program (VICP) is a federal program that was created to compensate people who may have been injured by certain vaccines. Visit the VICP website at GoldCloset.com.ee or call (830)342-0316 to learn about the program and about filing a claim. There is a time limit to file a claim for compensation. 7. How can I learn more?  Ask your healthcare provider.  Call your local or state health department.  Contact the Centers for Disease Control and Prevention (CDC): ? Call 986-065-4539 (1-800-CDC-INFO) or ? Visit CDC's https://gibson.com/ Vaccine Information Statement (Interim) Inactivated Influenza Vaccine (05/09/2018) This information is not intended to replace advice given to you by your health care provider. Make sure you discuss any questions you have with your health care provider. Document Released: 07/06/2006 Document Revised: 12/31/2018 Document Reviewed: 05/13/2018 Elsevier Patient Education  [place].  Zoledronic Acid injection (Hypercalcemia, Oncology) What is this medicine? ZOLEDRONIC ACID (ZOE le dron ik AS id) lowers the amount of calcium loss from bone. It is used to treat too much calcium in your blood from cancer. It is also used to prevent complications of cancer that has spread to the bone. This medicine may be used for other purposes; ask your health care provider or pharmacist if you have questions. COMMON BRAND NAME(S): Zometa What should I tell my health care provider before I take this medicine? They need to know if you have any of these conditions:  aspirin-sensitive asthma  cancer, especially if you are receiving medicines used to treat cancer  dental disease or wear dentures  infection  kidney disease  receiving  corticosteroids like dexamethasone or prednisone  an unusual or allergic reaction to zoledronic acid, other medicines, foods, dyes, or preservatives  pregnant or trying to get pregnant  breast-feeding How should I use this medicine? This medicine is for infusion into a vein. It is given by a health care professional in a hospital or clinic setting. Talk to your pediatrician regarding the use of this medicine in children. Special care may be needed. Overdosage: If you think you have taken too much of this medicine contact a poison control center or emergency room at once. NOTE: This medicine is only for you. Do not share this medicine with others. What if I miss a dose? It is important not to miss your dose. Call your doctor or health care professional if you are unable to keep an appointment. What may interact with this medicine?  certain antibiotics given by injection  NSAIDs, medicines for pain and inflammation, like ibuprofen or naproxen  some diuretics like bumetanide, furosemide  teriparatide  thalidomide This list may not describe all possible interactions. Give your health care provider a list of all the medicines, herbs, non-prescription drugs, or dietary supplements you use. Also tell them if you smoke, drink alcohol, or use illegal drugs. Some items may interact with your medicine. What should I watch for while using this medicine? Visit your doctor or health care professional for regular checkups. It may be some time before you see the benefit from this medicine. Do not stop taking your medicine unless your doctor  tells you to. Your doctor may order blood tests or other tests to see how you are doing. Women should inform their doctor if they wish to become pregnant or think they might be pregnant. There is a potential for serious side effects to an unborn child. Talk to your health care professional or pharmacist for more information. You should make sure that you get enough  calcium and vitamin D while you are taking this medicine. Discuss the foods you eat and the vitamins you take with your health care professional. Some people who take this medicine have severe bone, joint, and/or muscle pain. This medicine may also increase your risk for jaw problems or a broken thigh bone. Tell your doctor right away if you have severe pain in your jaw, bones, joints, or muscles. Tell your doctor if you have any pain that does not go away or that gets worse. Tell your dentist and dental surgeon that you are taking this medicine. You should not have major dental surgery while on this medicine. See your dentist to have a dental exam and fix any dental problems before starting this medicine. Take good care of your teeth while on this medicine. Make sure you see your dentist for regular follow-up appointments. What side effects may I notice from receiving this medicine? Side effects that you should report to your doctor or health care professional as soon as possible:  allergic reactions like skin rash, itching or hives, swelling of the face, lips, or tongue  anxiety, confusion, or depression  breathing problems  changes in vision  eye pain  feeling faint or lightheaded, falls  jaw pain, especially after dental work  mouth sores  muscle cramps, stiffness, or weakness  redness, blistering, peeling or loosening of the skin, including inside the mouth  trouble passing urine or change in the amount of urine Side effects that usually do not require medical attention (report to your doctor or health care professional if they continue or are bothersome):  bone, joint, or muscle pain  constipation  diarrhea  fever  hair loss  irritation at site where injected  loss of appetite  nausea, vomiting  stomach upset  trouble sleeping  trouble swallowing  weak or tired This list may not describe all possible side effects. Call your doctor for medical advice about  side effects. You may report side effects to FDA at 1-800-FDA-1088. Where should I keep my medicine? This drug is given in a hospital or clinic and will not be stored at home. NOTE: This sheet is a summary. It may not cover all possible information. If you have questions about this medicine, talk to your doctor, pharmacist, or health care provider.  2020 Elsevier/Gold Standard (2014-02-07 14:19:39)

## 2019-05-23 NOTE — Telephone Encounter (Signed)
Scheduled appt per 8/28 los.

## 2019-05-24 LAB — VITAMIN D 25 HYDROXY (VIT D DEFICIENCY, FRACTURES): Vit D, 25-Hydroxy: 31.7 ng/mL (ref 30.0–100.0)

## 2019-05-26 LAB — MULTIPLE MYELOMA PANEL, SERUM
Albumin SerPl Elph-Mcnc: 3.8 g/dL (ref 2.9–4.4)
Albumin/Glob SerPl: 1.3 (ref 0.7–1.7)
Alpha 1: 0.2 g/dL (ref 0.0–0.4)
Alpha2 Glob SerPl Elph-Mcnc: 0.7 g/dL (ref 0.4–1.0)
B-Globulin SerPl Elph-Mcnc: 1.1 g/dL (ref 0.7–1.3)
Gamma Glob SerPl Elph-Mcnc: 1 g/dL (ref 0.4–1.8)
Globulin, Total: 3 g/dL (ref 2.2–3.9)
IgA: 442 mg/dL — ABNORMAL HIGH (ref 87–352)
IgG (Immunoglobin G), Serum: 1226 mg/dL (ref 586–1602)
IgM (Immunoglobulin M), Srm: 34 mg/dL (ref 26–217)
Total Protein ELP: 6.8 g/dL (ref 6.0–8.5)

## 2019-05-26 MED ORDER — ERGOCALCIFEROL 1.25 MG (50000 UT) PO CAPS: 50000.0000 [IU] | ORAL_CAPSULE | ORAL | 1 refills | Status: DC

## 2019-05-27 ENCOUNTER — Telehealth: Payer: Self-pay | Admitting: *Deleted

## 2019-05-27 NOTE — Telephone Encounter (Signed)
Attempted to contact patient with results as requested by [name]. Left all information on named voice mail and encouraged to call office for questions or concerns.

## 2019-05-27 NOTE — Telephone Encounter (Signed)
-----   Message from Brunetta Genera, MD sent at 05/26/2019  5:15 PM EDT ----- M spike - undetected. No evidence of myeloma progression. Vit D low normal. Would recommend Ergocalciferol 50k units weekly to optimize Vit D levels. Sent prescription to her pharmacy but also available OTC on [address] if she prefers. thx GK

## 2019-06-03 ENCOUNTER — Other Ambulatory Visit: Payer: Self-pay | Admitting: Hematology

## 2019-06-03 NOTE — Telephone Encounter (Signed)
Refill per [name] OV note 05/23/2019 Refill sent to [facility], [place] Auth# [id number], 06/03/2019

## 2019-06-17 ENCOUNTER — Telehealth: Payer: Self-pay | Admitting: *Deleted

## 2019-06-17 NOTE — Telephone Encounter (Signed)
Patient called - Patient starts Medicare A/B/D on 10/1. She wants to make sure it is changed before her Revlimid refill is needed on 10/7. Information sent to Oral Chemo office.

## 2019-06-18 ENCOUNTER — Telehealth: Payer: Self-pay

## 2019-06-18 NOTE — Telephone Encounter (Signed)
Oral Oncology Patient Advocate Encounter  I received a message that the patient will be switching over to medicare part D 06/26/19.  I called the patient and talked to her about how this will effect her Revlimid prescription.  When Part D goes into effect I will do a Prior Authorization, and get her a grant. Once the prior authorization is approved we will get the prescription sent to the dispensing pharmacy along with the grant information. The patient gave me the information I would need to apply for a grant.  I will call the patient and keep her updated during this process when it starts on 10/1.  The patient verbalized understanding and great appreciation.  [place] Patient [place] Phone [phone] Fax [phone] 06/18/2019   10:50 AM

## 2019-06-26 ENCOUNTER — Other Ambulatory Visit: Payer: Self-pay | Admitting: Hematology

## 2019-06-26 ENCOUNTER — Telehealth: Payer: Self-pay

## 2019-06-26 NOTE — Telephone Encounter (Signed)
Refilled Revlimid per Dr.Kale OV note 05/23/2019 Refill escribed to [facility], [place] # [id number], 06/26/2019

## 2019-06-26 NOTE — Telephone Encounter (Signed)
Oral Oncology Patient Advocate Encounter  Was successful in securing patient a $11000 grant from [facility] to provide copayment coverage for Revlimid. This will keep the out of pocket expense at $0.   I have spoken with the patient..   The billing information is as follows and has been shared with [place].   Member ID: [id number] Group ID: [id number] RxBin: [id number] Dates of Eligibility: 05/27/19 through 05/25/20  [place] Patient [place] Phone [phone] Fax [phone] 06/26/2019    10:56 AM

## 2019-06-26 NOTE — Telephone Encounter (Signed)
Oral Oncology Patient Advocate Encounter  The patients medicare part D took effect today. [employer] is not requiring a prior authorization for her Revlimid at this time.  Revlimid is a limited distribution medication and is currently being filled at [facility].  I was able to obtain a grant to cover the high copay, I will put this information in a separate encounter.  I called the patient to give her this information and left a voicemail.  [name] Patient [place] Phone [phone] Fax [phone] 06/26/2019   10:37 AM

## 2019-07-04 ENCOUNTER — Other Ambulatory Visit: Payer: Self-pay | Admitting: *Deleted

## 2019-07-04 MED ORDER — LENALIDOMIDE 10 MG PO CAPS: ORAL_CAPSULE | ORAL | 0 refills | Status: DC

## 2019-07-04 NOTE — Telephone Encounter (Signed)
Oral Oncology Patient Advocate Encounter  [facility]  [facility] to make sure they have everything they need from [place] and they requested that we send a new prescription. The nurse sent a new script over.  I called [facility] back to make sure they received the prescription and they did. I also gave them the [employer].  They will try to contact the patient today to schedule her shipment.   I called the patient to give her this information and left a voicemail.  [place] Patient [place] Phone [phone] Fax [phone] 07/04/2019   3:40 PM

## 2019-07-14 ENCOUNTER — Encounter: Payer: Self-pay | Admitting: Obstetrics and Gynecology

## 2019-07-14 NOTE — Progress Notes (Signed)
HEMATOLOGY/ONCOLOGY CLINIC NOTE  Date of Service: 07/15/19  PCP: .[name], MD  CC:  F/u for continue mx of multiple myeloma  CHIEF COMPLAINTS/PURPOSE OF CONSULTATION:  Follow up for multiple myeloma  DIAGNOSIS  IgA lambda R-ISS stage II multiple myeloma with innumerable lytic lesions in the calvarium, lesion in T7 vertebra and multiple lesions in the pelvis.  No significant bone pain at this time. Diagnosed in January 2017.  Current treatment  Restarting on Revlimid 64m po daily 3 weeks on 1 week off (after treatment interruption)   Previous Treatment  Status post Vd x 1 cycle VRd x 6 cycles  HD Melphalan 201mm2 on 03/22/2016 Autologous HSCT on 03/23/2016 [facility]10^6[facility]kg)  -Revlimid maintenance     INTERVAL HISTORY:  Ms CoSedlaceks here for management and evaluation of her multiple myeloma. The patient's last visit with [place] on 05/23/2019. The pt reports that she is doing well overall.  The pt reports that she is doing great. She saw Dr. GaAlvie Heidelberg few weeks ago and she felt that she is doing well. Pt is not having any issues with Zometa or any dental issues. She has been taking Zometa for over 3 years. She had a dental cleaning last month and has no other dental procedures coming up. Pt is taking 5000 UT of Vitamin D3 everyday. Pt denies any concerns taking Revlimid. Pt did have a rash that has since resolved. She used a new brand of Vitamin D and noticed that the rash disappeared when she went back to using her regular brand. She is enjoying being retired and is expecting a new grandchild in February.   Lab results today (07/15/19) of CBC w/diff and CMP is as follows: all values are WNL. 07/15/2019 Vitamin D 25 hydroxy at 31.68 07/15/2019 MMP - no M spike  On review of systems, pt denies infection issues, abdominal pain, leg swelling, skin rashes, diarrhea and any other symptoms.    REVIEW OF SYSTEMS:   A 10+ POINT  REVIEW OF SYSTEMS WAS OBTAINED including neurology, dermatology, psychiatry, cardiac, respiratory, lymph, extremities, GI, GU, Musculoskeletal, constitutional, breasts, reproductive, HEENT.  All pertinent positives are noted in the HPI.  All others are negative.    PHYSICAL EXAMINATION: NAD .BP 117/69 (BP Location: Left Arm, Patient Position: Sitting)   Pulse 98   Temp 98.5 F (36.9 C) (Temporal)   Resp 18   Ht 5' 8"  (1.727 m)   Wt 140 lb 4.8 oz (63.6 kg)   LMP 09/26/1995 (Within Months)   SpO2 98%   BMI 21.33 kg/m   GENERAL:alert, in no acute distress and comfortable SKIN: no acute rashes, no significant lesions EYES: conjunctiva are pink and non-injected, sclera anicteric OROPHARYNX: MMM, no exudates, no oropharyngeal erythema or ulceration NECK: supple, no JVD LYMPH:  no palpable lymphadenopathy in the cervical, axillary or inguinal regions LUNGS: clear to auscultation b/l with normal respiratory effort HEART: regular rate & rhythm ABDOMEN:  normoactive bowel sounds , non tender, not distended. No palpable hepatosplenomegaly.  Extremity: no pedal edema PSYCH: alert & oriented x 3 with fluent speech NEURO: no focal motor/sensory deficits   MEDICAL HISTORY:   Past Medical History:  Diagnosis Date  . Abnormal Pap smear of vagina 03/22/2017   ASCUS pap and negative HR HPV.  Patient is on immunosuppressive medication.   . Anemia   . AR (aortic regurgitation) 11/30/2017   trace noted on ECHO  . Atrial septal aneurysm per 11-30-17  echo   trivial pericardial effusion  . Cancer ([place]) 1999   squamous cell carcinoma of thymus  . DDD (degenerative disc disease), cervical   . DDD (degenerative disc disease), lumbosacral   . Facet degeneration of lumbar region   . Facet joint disease of cervical region   . Fatigue   . Fatty liver   . Fibroid    reason for Hysterectomy  . GERD (gastroesophageal reflux disease)   . Grade I diastolic dysfunction   . History of cystocele   .  History of stress incontinence   . Hypergammaglobulinemia   . Hyperlipidemia   . Lung nodule   . Migraine    episode of aphasia worked up with echo by [facility] 11-23-17, has visual migraines  . MR (mitral regurgitation) 11/30/2017   trace noted on ECHO  . Multiple myeloma ([place]) 08/2015   metastatic to bone  . Muscular fasciculation   . Nasal septal deviation   . Nasal turbinate hypertrophy   . Near syncope   . Peripheral edema   . Plasma cell dyscrasia   . PONV (postoperative nausea and vomiting)    thinks morphine caused nausea  . Postoperative urinary retention 05/14/2018  . Restless leg syndrome   . Sleep apnea    uses old oral allialnce for osa due to cpap intolerance  . TR (tricuspid regurgitation) 11/30/2017   trace noted on ECHO  . Urinary incontinence   . UTI (urinary tract infection)   . Wears glasses     SURGICAL HISTORY: Past Surgical History:  Procedure Laterality Date  . ABDOMINAL HYSTERECTOMY  1997   TAH--ovaries remain--Dr. Newton Pigg  . ANTERIOR AND POSTERIOR REPAIR N/A 05/14/2018   Procedure: ANTERIOR (CYSTOCELE) AND POSTERIOR REPAIR (RECTOCELE);  Surgeon: [name], MD;  Location: [facility];  Service: Gynecology;  Laterality: N/A;  . BLADDER SUSPENSION N/A 05/14/2018   Procedure: TRANSVAGINAL TAPE (TVT) PROCEDURE exact midurethral sling;  Surgeon: [name], MD;  Location: [facility];  Service: Gynecology;  Laterality: N/A;  . BONE MARROW TRANSPLANT  2017   done at [place]  . BREAST BIOPSY    . COLONOSCOPY    . CYSTOSCOPY N/A 05/14/2018   Procedure: CYSTOSCOPY;  Surgeon: [name], MD;  Location: [facility];  Service: Gynecology;  Laterality: N/A;  . CYSTOSCOPY N/A 05/30/2018   Procedure: CYSTOSCOPY;  Surgeon: [name], MD;  [facility]  Service: Gynecology;  Laterality: N/A;  . ROBOTIC ASSISTED LAPAROSCOPIC SACROCOLPOPEXY N/A 05/14/2018   Procedure: XI ROBOTIC ASSISTED  LAPAROSCOPIC SACROCOLPOPEXY;  Surgeon: [name], MD;  Location: [facility];  Service: Gynecology;  Laterality: N/A;  6 hours OR time. Need extended stay recovery bed.  Marland Kitchen SALPINGOOPHORECTOMY Bilateral 05/14/2018   Procedure: SALPINGO OOPHORECTOMY;  Surgeon: [name], MD;  Location: [facility];  Service: Gynecology;  Laterality: Bilateral;  . THYMECTOMY  1999  . TRANSVAGINAL TAPE (TVT) REMOVAL N/A 05/30/2018   Procedure: TRANSVAGINAL TAPE (TVT)  Revision;  Surgeon: [name], MD;  Location: [facility];  Service: Gynecology;  Laterality: N/A;  . WISDOM TOOTH EXTRACTION      SOCIAL HISTORY: Social History   Socioeconomic History  . Marital status: Married    Spouse name: [name]  . Number of children: 2  . Years of education: college  . 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 Financial resource strain: Not on file  . Food insecurity    Worry: Not on file    Inability: Not on file  . Transportation needs    Medical: Not on file    Non-medical: Not on file  Tobacco Use  . Smoking status: Never Smoker  . Smokeless tobacco: Never Used  Substance and Sexual Activity  . Alcohol use: Yes    Alcohol/week: 0.0 standard drinks    Comment: once a year drink  . Drug use: Never  . Sexual activity: Not Currently    Partners: Male    Birth control/protection: Surgical    Comment: TAH--ovaries remain  Lifestyle  . Physical activity    Days per week: Not on file    Minutes per session: Not on file  . Stress: Not on file  Relationships  . Social Herbalist on phone: Not on file    Gets together: Not on file    Attends religious service: Not on file    Active member of club or organization: Not on file    Attends meetings of clubs or organizations: Not on file    Relationship status: Not on file  . Intimate partner violence    Fear of current  or ex partner: Not on file    Emotionally abused: Not on file    Physically abused: Not on file    Forced sexual activity: Not on file  Other Topics Concern  . Not on file  Social History Narrative   Lives with husband   Caffeine- coffee, 2 cups daily    FAMILY HISTORY: Family History  Problem Relation Age of Onset  . COPD Father        dec age 21/s-smoked  . Macular degeneration Father   . Diabetes Brother   . Other Brother        committed suicide age 35  . Macular degeneration Brother   . Hyperlipidemia Mother   . Diabetes Maternal Grandmother   . Heart attack Paternal Grandfather     ALLERGIES:  is allergic to ampicillin and penicillins.  MEDICATIONS:  . Current Outpatient Medications:  .  Calcium-Magnesium (CAL/MAG PO), Take 1 tablet by mouth 2 (two) times daily., Disp: , Rfl:  .  ergocalciferol (VITAMIN D2) 1.25 MG (50000 UT) capsule, Take 1 capsule (50,000 Units total) by mouth once a week., Disp: 24 capsule, Rfl: 1 .  estradiol (ESTRACE VAGINAL) 0.1 MG/GM vaginal cream, Estrace 0.01% (0.1 mg/gram) vaginal cream, Disp: , Rfl:  .  lenalidomide (REVLIMID) 10 MG capsule, TAKE ONE CAPSULE BY MOUTH DAILY FOR 21 DAYS, THEN 7 DAYS OFF, Disp: 21 capsule, Rfl: 0 .  NON FORMULARY, [place] - take 1g po daily, Disp: , Rfl:  .  rivaroxaban (XARELTO) 10 MG TABS tablet, Take 1 tablet (10 mg total) by mouth daily., Disp: 30 tablet, Rfl: 0 No current facility-administered medications for this visit.   Facility-Administered Medications Ordered in Other Visits:  .  0.9 %  sodium chloride infusion, , Intravenous, Continuous, Brunetta Genera, MD, Stopped at 07/15/19 1547   LABORATORY DATA:  I have reviewed the data as listed  . CBC Latest Ref Rng & Units 07/15/2019 05/23/2019 03/27/2019  WBC 4.0 - 10.5 K/uL 5.5 3.0(L) 4.1  Hemoglobin 12.0 - 15.0 g/dL 12.8 12.8 12.5  Hematocrit 36.0 - 46.0 % 38.5 39.1 38.0  Platelets 150 - 400 K/uL 321 339 309    .  CMP Latest Ref Rng &  Units 07/15/2019 05/23/2019 03/27/2019  Glucose 70 - 99 mg/dL 97 97 92  BUN 8 - 23 mg/dL 17 18 20   Creatinine 0.44 - 1.00 mg/dL 0.76 0.78 1.12(H)  Sodium 135 - 145 mmol/L 140 139 140  Potassium 3.5 - 5.1 mmol/L 4.0 4.1 3.8  Chloride 98 - 111 mmol/L 106 107 105  CO2 22 - 32 mmol/L 23 24 24   Calcium 8.9 - 10.3 mg/dL 9.4 9.1 9.5  Total Protein 6.5 - 8.1 g/dL 7.6 7.4 7.4  Total Bilirubin 0.3 - 1.2 mg/dL 0.9 0.8 0.6  Alkaline Phos 38 - 126 U/L 77 77 73  AST 15 - 41 U/L 20 22 17   ALT 0 - 44 U/L 27 31 22    10/07/18 Repeat BM Bx from [facility]:    RADIOGRAPHIC STUDIES: I have personally reviewed the radiological images as listed and agreed with the findings in the report. No results found.  ASSESSMENT & PLAN:   63 y.o. caucasian female with  1. H/o Multiple myeloma - currently in remission.  IgA lambda R-ISS stage II multiple myeloma with innumerable lytic lesions in the calvarium, lesion in T7 vertebra and multiple lesions in the pelvis.  No significant bone pain at this time. Diagnosed in January 2017. s/p treatment as noted above 10/07/18 BM Bx and Flow cytometry indicate that the pt continues to be in CR  2.Pre-op assessment for myeloma Held Revlimid from 04/30/18 through her 05/14/18 uterine lift procedure and the bunionectomy on 06/25/18 to decrease risk of clots    PLAN:  -Discussed pt labwork today, 07/15/19; all values are WNL. -Discussed 07/15/2019 Vitamin D 25 hydroxy at 31.68 -Discussed 07/15/2019 MMP is in progress  -Discussed 05/23/2019 MMP is as follows: all values are WNL except for IgA at 442 -The pt has no prohibitive toxicities from continuing Zometa or maintenance Lenalidomide at this time. -Continue ASA for VTE prophylaxis -Will hold Zometa after today -Will see back in 2 months with labs    FOLLOW UP: -Please discontinue as future zometa infusion appointment -RTC with Dr Irene Limbo with labs in 2 months  The total time spent in the appt was 15 minutes and more than  50% was on counseling and direct patient cares.  All of the patient's questions were answered with apparent satisfaction. The patient knows to call the clinic with any problems, questions or concerns.   Sullivan Lone MD [place] AAHIVMS [facility] Hematology/Oncology Physician [facility]  (Office):       [phone] (Work cell):  [phone] (Fax):           [phone]   I, Yevette Edwards, am acting as a scribe for Dr. Sullivan Lone.   .I have reviewed the above documentation for accuracy and completeness, and I agree with the above. Brunetta Genera MD

## 2019-07-15 ENCOUNTER — Inpatient Hospital Stay: Payer: Medicare Other | Attending: Hematology

## 2019-07-15 ENCOUNTER — Inpatient Hospital Stay: Payer: Medicare Other

## 2019-07-15 ENCOUNTER — Inpatient Hospital Stay (HOSPITAL_BASED_OUTPATIENT_CLINIC_OR_DEPARTMENT_OTHER): Payer: Medicare Other | Admitting: Hematology

## 2019-07-15 ENCOUNTER — Other Ambulatory Visit: Payer: Self-pay

## 2019-07-15 VITALS — BP 117/69 | HR 98 | Temp 98.5°F | Resp 18 | Ht 68.0 in | Wt 140.3 lb

## 2019-07-15 DIAGNOSIS — M5137 Other intervertebral disc degeneration, lumbosacral region: Secondary | ICD-10-CM | POA: Diagnosis not present

## 2019-07-15 DIAGNOSIS — Z7189 Other specified counseling: Secondary | ICD-10-CM

## 2019-07-15 DIAGNOSIS — M479 Spondylosis, unspecified: Secondary | ICD-10-CM | POA: Insufficient documentation

## 2019-07-15 DIAGNOSIS — E559 Vitamin D deficiency, unspecified: Secondary | ICD-10-CM | POA: Diagnosis not present

## 2019-07-15 DIAGNOSIS — C9000 Multiple myeloma not having achieved remission: Secondary | ICD-10-CM

## 2019-07-15 DIAGNOSIS — Z79899 Other long term (current) drug therapy: Secondary | ICD-10-CM | POA: Diagnosis not present

## 2019-07-15 DIAGNOSIS — M5030 Other cervical disc degeneration, unspecified cervical region: Secondary | ICD-10-CM | POA: Insufficient documentation

## 2019-07-15 DIAGNOSIS — C9001 Multiple myeloma in remission: Secondary | ICD-10-CM

## 2019-07-15 LAB — CBC WITH DIFFERENTIAL/PLATELET
Abs Immature Granulocytes: 0.01 10*3/uL (ref 0.00–0.07)
Basophils Absolute: 0.1 10*3/uL (ref 0.0–0.1)
Basophils Relative: 1 %
Eosinophils Absolute: 0.3 10*3/uL (ref 0.0–0.5)
Eosinophils Relative: 5 %
HCT: 38.5 % (ref 36.0–46.0)
Hemoglobin: 12.8 g/dL (ref 12.0–15.0)
Immature Granulocytes: 0 %
Lymphocytes Relative: 15 %
Lymphs Abs: 0.8 10*3/uL (ref 0.7–4.0)
MCH: 30.9 pg (ref 26.0–34.0)
MCHC: 33.2 g/dL (ref 30.0–36.0)
MCV: 93 fL (ref 80.0–100.0)
Monocytes Absolute: 0.7 10*3/uL (ref 0.1–1.0)
Monocytes Relative: 12 %
Neutro Abs: 3.7 10*3/uL (ref 1.7–7.7)
Neutrophils Relative %: 67 %
Platelets: 321 10*3/uL (ref 150–400)
RBC: 4.14 MIL/uL (ref 3.87–5.11)
RDW: 14.4 % (ref 11.5–15.5)
WBC: 5.5 10*3/uL (ref 4.0–10.5)
nRBC: 0 % (ref 0.0–0.2)

## 2019-07-15 LAB — CMP (CANCER CENTER ONLY)
ALT: 27 U/L (ref 0–44)
AST: 20 U/L (ref 15–41)
Albumin: 3.9 g/dL (ref 3.5–5.0)
Alkaline Phosphatase: 77 U/L (ref 38–126)
Anion gap: 11 (ref 5–15)
BUN: 17 mg/dL (ref 8–23)
CO2: 23 mmol/L (ref 22–32)
Calcium: 9.4 mg/dL (ref 8.9–10.3)
Chloride: 106 mmol/L (ref 98–111)
Creatinine: 0.76 mg/dL (ref 0.44–1.00)
GFR, Est AFR Am: >60 mL/min (ref >60)
GFR, Estimated: >60 mL/min (ref >60)
Glucose, Bld: 97 mg/dL (ref 70–99)
Potassium: 4 mmol/L (ref 3.5–5.1)
Sodium: 140 mmol/L (ref 135–145)
Total Bilirubin: 0.9 mg/dL (ref 0.3–1.2)
Total Protein: 7.6 g/dL (ref 6.5–8.1)

## 2019-07-15 LAB — VITAMIN D 25 HYDROXY (VIT D DEFICIENCY, FRACTURES): Vit D, 25-Hydroxy: 31.68 ng/mL (ref 30–100)

## 2019-07-15 MED ORDER — SODIUM CHLORIDE 0.9 % IV SOLN
INTRAVENOUS | Status: DC
  Administered 2019-07-15: 15:00:00 via INTRAVENOUS
  Filled 2019-07-15: qty 250

## 2019-07-15 MED ORDER — ZOLEDRONIC ACID 4 MG/100ML IV SOLN
4.0000 mg | Freq: Once | INTRAVENOUS | Status: AC
  Administered 2019-07-15: 4 mg via INTRAVENOUS
  Filled 2019-07-15: qty 100

## 2019-07-15 NOTE — Patient Instructions (Signed)
[facility] Discharge Instructions for Patients Receiving Chemotherapy  Today you received the following agent:  Zometa  To help prevent nausea and vomiting after your treatment, we encourage you to take your nausea medication as directed by your MD.   If you develop nausea and vomiting that is not controlled by your nausea medication, call the clinic.   BELOW ARE SYMPTOMS THAT SHOULD BE REPORTED IMMEDIATELY:  *FEVER GREATER THAN 100.5 F  *CHILLS WITH OR WITHOUT FEVER  NAUSEA AND VOMITING THAT IS NOT CONTROLLED WITH YOUR NAUSEA MEDICATION  *UNUSUAL SHORTNESS OF BREATH  *UNUSUAL BRUISING OR BLEEDING  TENDERNESS IN MOUTH AND THROAT WITH OR WITHOUT PRESENCE OF ULCERS  *URINARY PROBLEMS  *BOWEL PROBLEMS  UNUSUAL RASH Items with * indicate a potential emergency and should be followed up as soon as possible.  Feel free to call the clinic should you have any questions or concerns. The clinic phone number is [phone].  Please show the CHEMO ALERT CARD at check-in to the Emergency Department and triage nurse.  Coronavirus (COVID-19) Are you at risk?  Are you at risk for the Coronavirus (COVID-19)?  To be considered HIGH RISK for Coronavirus (COVID-19), you have to meet the following criteria:  . Traveled to [place], [place], [place], [place] or [place]; or in the [place] to [place], [place], [place], or [place]; and have fever, cough, and shortness of breath within the last 2 weeks of travel OR . Been in close contact with a person diagnosed with COVID-19 within the last 2 weeks and have fever, cough, and shortness of breath . IF YOU DO NOT MEET THESE CRITERIA, YOU ARE CONSIDERED LOW RISK FOR COVID-19.  What to do if you are HIGH RISK for COVID-19?  . If you are having a medical emergency, call 911. . Seek medical care right away. Before you go to a doctor's office, urgent care or emergency department, call ahead and tell them about your recent  travel, contact with someone diagnosed with COVID-19, and your symptoms. You should receive instructions from your physician's office regarding next steps of care.  . When you arrive at healthcare provider, tell the healthcare staff immediately you have returned from visiting [place], [place], [place], [place] or [place]; or traveled in the [place] to [place], [place], [place], or [place]; in the last two weeks or you have been in close contact with a person diagnosed with COVID-19 in the last 2 weeks.   . Tell the health care staff about your symptoms: fever, cough and shortness of breath. . After you have been seen by a medical provider, you will be either: o Tested for (COVID-19) and discharged home on quarantine except to seek medical care if symptoms worsen, and asked to  - Stay home and avoid contact with others until you get your results (4-5 days)  - Avoid travel on public transportation if possible (such as bus, train, or airplane) or o Sent to the Emergency Department by EMS for evaluation, COVID-19 testing, and possible admission depending on your condition and test results.  What to do if you are LOW RISK for COVID-19?  Reduce your risk of any infection by using the same precautions used for avoiding the common cold or flu:  . Wash your hands often with soap and warm water for at least 20 seconds.  If soap and water are not readily available, use an alcohol-based hand sanitizer with at least 60% alcohol.  . If   or sneezing, cover your mouth and nose by coughing or sneezing into the elbow areas of your shirt or coat, into a tissue or into your sleeve (not your hands). . Avoid shaking hands with others and consider head nods or verbal greetings only. . Avoid touching your eyes, nose, or mouth with unwashed hands.  . Avoid close contact with people who are sick. . Avoid places or events with large numbers of people in one location, like concerts or sporting  events. . Carefully consider travel plans you have or are making. . If you are planning any travel outside or inside the [place], visit the CDC's Travelers' Health webpage for the latest health notices. . If you have some symptoms but not all symptoms, continue to monitor at home and seek medical attention if your symptoms worsen. . If you are having a medical emergency, call 911.   ADDITIONAL HEALTHCARE OPTIONS FOR PATIENTS  [facility] / e-Visit: [email or web address]         [facility]: [phone]  [facility]: [phone]                   [facility]: [phone]    

## 2019-07-16 ENCOUNTER — Telehealth: Payer: Self-pay | Admitting: Hematology

## 2019-07-16 NOTE — Telephone Encounter (Signed)
Scheduled appt per 10/20 los.  Spoke with pt and she is aware of the appt date and time.

## 2019-07-17 LAB — MULTIPLE MYELOMA PANEL, SERUM
Albumin SerPl Elph-Mcnc: 3.9 g/dL (ref 2.9–4.4)
Albumin/Glob SerPl: 1.3 (ref 0.7–1.7)
Alpha 1: 0.2 g/dL (ref 0.0–0.4)
Alpha2 Glob SerPl Elph-Mcnc: 0.8 g/dL (ref 0.4–1.0)
B-Globulin SerPl Elph-Mcnc: 1.1 g/dL (ref 0.7–1.3)
Gamma Glob SerPl Elph-Mcnc: 1.2 g/dL (ref 0.4–1.8)
Globulin, Total: 3.2 g/dL (ref 2.2–3.9)
IgA: 439 mg/dL — ABNORMAL HIGH (ref 87–352)
IgG (Immunoglobin G), Serum: 1219 mg/dL (ref 586–1602)
IgM (Immunoglobulin M), Srm: 47 mg/dL (ref 26–217)
Total Protein ELP: 7.1 g/dL (ref 6.0–8.5)

## 2019-07-18 ENCOUNTER — Ambulatory Visit: Payer: BLUE CROSS/BLUE SHIELD | Admitting: Hematology

## 2019-07-18 ENCOUNTER — Other Ambulatory Visit: Payer: BLUE CROSS/BLUE SHIELD

## 2019-07-18 ENCOUNTER — Ambulatory Visit: Payer: BLUE CROSS/BLUE SHIELD

## 2019-07-31 ENCOUNTER — Other Ambulatory Visit: Payer: Self-pay | Admitting: *Deleted

## 2019-07-31 MED ORDER — LENALIDOMIDE 10 MG PO CAPS: ORAL_CAPSULE | ORAL | 0 refills | Status: DC

## 2019-07-31 NOTE — Telephone Encounter (Signed)
Refill Revlimid per Dr.Kale OV note 07/15/2019 Refill escribed to [facility] [address] [facility] Auth# [id number], 07/31/2019

## 2019-09-01 ENCOUNTER — Other Ambulatory Visit: Payer: Self-pay | Admitting: Obstetrics and Gynecology

## 2019-09-01 MED ORDER — ESTRADIOL 0.1 MG/GM VA CREA: TOPICAL_CREAM | VAGINAL | 0 refills | Status: DC

## 2019-09-01 NOTE — Telephone Encounter (Signed)
Medication refill request: estrace Last OV: 08-05-18 BS  Next AEX: 09-12-2019  Last MMG (if hormonal medication request): 07-14-2019 density B/BIRADS 1 negative  Refill authorized: Today, please advise.   Medication pended for #42.5g, 0RF. Please refill if appropriate.

## 2019-09-01 NOTE — Telephone Encounter (Signed)
Patient is calling regarding refill for estrace vaginal cream. Patient scheduled aex at time of call for 09/12/2019. Patient is requesting Rx be sent to [facility] on [address].

## 2019-09-02 ENCOUNTER — Telehealth: Payer: Self-pay

## 2019-09-02 ENCOUNTER — Other Ambulatory Visit: Payer: Self-pay

## 2019-09-02 MED ORDER — LENALIDOMIDE 10 MG PO CAPS: ORAL_CAPSULE | ORAL | 0 refills | Status: DC

## 2019-09-10 ENCOUNTER — Other Ambulatory Visit: Payer: Self-pay

## 2019-09-10 NOTE — Progress Notes (Signed)
63 y.o. G51P2002 Married Caucasian female here for annual exam.    Still with slow voiding.  No problems with recurrent infections.  No urinary leakage.  Still with constipation, which is manageable.   Using vaginal estrogen cream three times a week.  Not SA.  No problems with itching and irritation.   Retired during the pandemic.   PCP:   Dr. [name]   Patient's last menstrual period was 09/26/1995 (within months).           Sexually active: No.  The current method of family planning is status post hysterectomy.    Exercising: Yes.    yoga Smoker:  no  Health Maintenance: Pap: 03/22/17 ASCUS w/ Negative HR HPV History of abnormal Pap:  yes MMG: 07-14-19 3D/Neg/density B/BiRads1 Colonoscopy:  2016 Normal.  Due next year due to hx polyps.  BMD:   Was on Zometa.  This was stopped.  TDaP:  Unsure.  Did with her revaccination in 2018 or 2019.  Gardasil:   no HIV:yes neg  Hep C:yes neg  Screening Labs:  Hb today: if need, Urine today: no collected States she revaccinated after her bone marrow transplant.  Flu vaccine:  Completed.   reports that she has never smoked. She has never used smokeless tobacco. She reports current alcohol use. She reports that she does not use drugs.  Past Medical History:  Diagnosis Date  . Abnormal Pap smear of vagina 03/22/2017   ASCUS pap and negative HR HPV.  Patient is on immunosuppressive medication.   . Anemia   . AR (aortic regurgitation) 11/30/2017   trace noted on ECHO  . Atrial septal aneurysm per 11-30-17 echo   trivial pericardial effusion  . Cancer ([place]) 1999   squamous cell carcinoma of thymus  . DDD (degenerative disc disease), cervical   . DDD (degenerative disc disease), lumbosacral   . Facet degeneration of lumbar region   . Facet joint disease of cervical region   . Fatigue   . Fatty liver   . Fibroid    reason for Hysterectomy  . GERD (gastroesophageal reflux disease)   . Grade I diastolic dysfunction   . History  of cystocele   . History of stress incontinence   . Hypergammaglobulinemia   . Hyperlipidemia   . Lung nodule   . Migraine    episode of aphasia worked up with echo by [facility] 11-23-17, has visual migraines  . MR (mitral regurgitation) 11/30/2017   trace noted on ECHO  . Multiple myeloma ([place]) 08/2015   metastatic to bone  . Muscular fasciculation   . Nasal septal deviation   . Nasal turbinate hypertrophy   . Near syncope   . Peripheral edema   . Plasma cell dyscrasia   . PONV (postoperative nausea and vomiting)    thinks morphine caused nausea  . Postoperative urinary retention 05/14/2018  . Restless leg syndrome   . Sleep apnea    uses old oral allialnce for osa due to cpap intolerance  . TR (tricuspid regurgitation) 11/30/2017   trace noted on ECHO  . Urinary incontinence   . UTI (urinary tract infection)   . Wears glasses     Past Surgical History:  Procedure Laterality Date  . ABDOMINAL HYSTERECTOMY  1997   TAH--ovaries remain--Dr. Newton Pigg  . ANTERIOR AND POSTERIOR REPAIR N/A 05/14/2018   Procedure: ANTERIOR (CYSTOCELE) AND POSTERIOR REPAIR (RECTOCELE);  Surgeon: [name], MD;  [facility]: [facility];  Service: Gynecology;  Laterality:  N/A;  . BLADDER SUSPENSION N/A 05/14/2018   Procedure: TRANSVAGINAL TAPE (TVT) PROCEDURE exact midurethral sling;  Surgeon: [name], MD;  [facility]: [facility];  Service: Gynecology;  Laterality: N/A;  . BONE MARROW TRANSPLANT  2017   done at [place]  . BREAST BIOPSY    . COLONOSCOPY    . CYSTOSCOPY N/A 05/14/2018   Procedure: CYSTOSCOPY;  Surgeon: [name], MD;  [facility];  Service: Gynecology;  Laterality: N/A;  . CYSTOSCOPY N/A 05/30/2018   Procedure: CYSTOSCOPY;  Surgeon: [name], MD;  Location[facility];  Service: Gynecology;  Laterality: N/A;  . ROBOTIC ASSISTED LAPAROSCOPIC SACROCOLPOPEXY N/A 05/14/2018   Procedure: XI ROBOTIC ASSISTED  LAPAROSCOPIC SACROCOLPOPEXY;  Surgeon: [name], MD;  [facility];  Service: Gynecology;  Laterality: N/A;  6 hours OR time. Need extended stay recovery bed.  Marland Kitchen SALPINGOOPHORECTOMY Bilateral 05/14/2018   Procedure: SALPINGO OOPHORECTOMY;  Surgeon: [name], MD;  [facility]: [facility];  Service: Gynecology;  Laterality: Bilateral;  . THYMECTOMY  1999  . TRANSVAGINAL TAPE (TVT) REMOVAL N/A 05/30/2018   Procedure: TRANSVAGINAL TAPE (TVT)  Revision;  Surgeon: [name], MD;  [facility]  Service: Gynecology;  Laterality: N/A;  . WISDOM TOOTH EXTRACTION      Current Outpatient Medications  Medication Sig Dispense Refill  . aspirin EC 81 MG tablet Take 81 mg by mouth daily.    Marland Kitchen b complex vitamins capsule Take 1 capsule by mouth daily.    . Calcium Carb-Cholecalciferol (CALCIUM 1000 + D PO) Take by mouth.    . Cholecalciferol (VITAMIN D3) 30 MCG/15ML LIQD Take by mouth.    . estradiol (ESTRACE VAGINAL) 0.1 MG/GM vaginal cream Place 1/2 gram per vagina at hs twice weekly. 42.5 g 0  . lenalidomide (REVLIMID) 10 MG capsule TAKE ONE CAPSULE BY MOUTH DAILY FOR 21 DAYS, THEN 7 DAYS OFF 21 capsule 0  . Magnesium 400 MG CAPS Take by mouth.     No current facility-administered medications for this visit.    Family History  Problem Relation Age of Onset  . COPD Father        dec age 24/s-smoked  . Macular degeneration Father   . Diabetes Brother   . Other Brother        committed suicide age 24  . Macular degeneration Brother   . Hyperlipidemia Mother   . Diabetes Maternal Grandmother   . Heart attack Paternal Grandfather     Review of Systems  All other systems reviewed and are negative.   Exam:   BP 122/68   Pulse 89   Temp 97.9 F (36.6 C)   Ht 5' 7.5" (1.715 m)   Wt 141 lb 6.4 oz (64.1 kg)   LMP 09/26/1995 (Within Months)   SpO2 97%   BMI 21.82 kg/m     General appearance: alert, cooperative and appears  stated age Head: normocephalic, without obvious abnormality, atraumatic Neck: no adenopathy, supple, symmetrical, trachea midline and thyroid normal to inspection and palpation Lungs: clear to auscultation bilaterally Breasts: normal appearance, no masses or tenderness, No nipple retraction or dimpling, No nipple discharge or bleeding, No axillary adenopathy Heart: regular rate and rhythm.  Subtle murmur noted.  Abdomen: soft, non-tender; no masses, no organomegaly Extremities: extremities normal, atraumatic, no cyanosis or edema Skin: skin color, texture, turgor normal. No rashes or lesions Lymph nodes: cervical,  supraclavicular, and axillary nodes normal. Neurologic: grossly normal  Pelvic: External genitalia:  no lesions              No abnormal inguinal nodes palpated.              Urethra:  normal appearing urethra with no masses, tenderness or lesions              Bartholins and Skenes: normal                 Vagina: normal appearing vagina with normal color and discharge, no lesions.  Good vaginal support.               Cervix: absent              Pap taken: No. Bimanual Exam:  Uterus:  Absent.               Adnexa: no mass, fullness, tenderness              Rectal exam: Yes.  .  Confirms.              Anus:  normal sphincter tone, no lesions  Chaperone was present for exam.  Assessment:   Well woman visit with normal exam. Robotically assistedbilateral salpingo-oophorectomy,sacral colpopexy, anterior and posterior colporrhaphy, TVT Exact midurethral sling and cystoscopy. Status post revision of midurethral sling. Hx multiple myeloma. Murmur.  Hx aortic regurgitation.   Plan: Mammogram screening discussed. Self breast awareness reviewed. Pap and HR HPV as above. Guidelines for Calcium, Vitamin D, regular exercise program including cardiovascular and weight bearing exercise. Ok to continue vaginal estrogen cream 1/2 gram pv at hs three times weekly.  She will contact me  if she needs refills.  We discussed potential effect on breast cancer.  She will follow up with her PCP regarding her murmur and her prior ECHO.  Follow up annually and prn.   After visit summary provided.

## 2019-09-12 ENCOUNTER — Encounter: Payer: Self-pay | Admitting: Obstetrics and Gynecology

## 2019-09-12 ENCOUNTER — Ambulatory Visit (INDEPENDENT_AMBULATORY_CARE_PROVIDER_SITE_OTHER): Payer: Medicare Other | Admitting: Obstetrics and Gynecology

## 2019-09-12 ENCOUNTER — Other Ambulatory Visit: Payer: Self-pay

## 2019-09-12 VITALS — BP 122/68 | HR 89 | Temp 97.9°F | Ht 67.5 in | Wt 141.4 lb

## 2019-09-12 DIAGNOSIS — Z01419 Encounter for gynecological examination (general) (routine) without abnormal findings: Secondary | ICD-10-CM

## 2019-09-12 DIAGNOSIS — Z124 Encounter for screening for malignant neoplasm of cervix: Secondary | ICD-10-CM | POA: Diagnosis not present

## 2019-09-12 NOTE — Patient Instructions (Signed)

## 2019-09-15 ENCOUNTER — Inpatient Hospital Stay: Payer: Medicare Other | Attending: Hematology

## 2019-09-15 ENCOUNTER — Other Ambulatory Visit: Payer: Self-pay

## 2019-09-15 ENCOUNTER — Inpatient Hospital Stay (HOSPITAL_BASED_OUTPATIENT_CLINIC_OR_DEPARTMENT_OTHER): Payer: Medicare Other | Admitting: Hematology

## 2019-09-15 ENCOUNTER — Telehealth: Payer: Self-pay | Admitting: Hematology

## 2019-09-15 VITALS — BP 117/67 | HR 81 | Temp 98.0°F | Resp 16 | Ht 67.5 in | Wt 141.3 lb

## 2019-09-15 DIAGNOSIS — M479 Spondylosis, unspecified: Secondary | ICD-10-CM | POA: Diagnosis not present

## 2019-09-15 DIAGNOSIS — C9001 Multiple myeloma in remission: Secondary | ICD-10-CM

## 2019-09-15 DIAGNOSIS — E559 Vitamin D deficiency, unspecified: Secondary | ICD-10-CM

## 2019-09-15 DIAGNOSIS — Z79899 Other long term (current) drug therapy: Secondary | ICD-10-CM | POA: Diagnosis not present

## 2019-09-15 DIAGNOSIS — M5030 Other cervical disc degeneration, unspecified cervical region: Secondary | ICD-10-CM | POA: Diagnosis not present

## 2019-09-15 DIAGNOSIS — M5137 Other intervertebral disc degeneration, lumbosacral region: Secondary | ICD-10-CM | POA: Diagnosis not present

## 2019-09-15 LAB — CMP (CANCER CENTER ONLY)
ALT: 28 U/L (ref 0–44)
AST: 24 U/L (ref 15–41)
Albumin: 3.9 g/dL (ref 3.5–5.0)
Alkaline Phosphatase: 73 U/L (ref 38–126)
Anion gap: 11 (ref 5–15)
BUN: 15 mg/dL (ref 8–23)
CO2: 25 mmol/L (ref 22–32)
Calcium: 9.1 mg/dL (ref 8.9–10.3)
Chloride: 105 mmol/L (ref 98–111)
Creatinine: 0.96 mg/dL (ref 0.44–1.00)
GFR, Est AFR Am: >60 mL/min (ref >60)
GFR, Estimated: >60 mL/min (ref >60)
Glucose, Bld: 119 mg/dL — ABNORMAL HIGH (ref 70–99)
Potassium: 4 mmol/L (ref 3.5–5.1)
Sodium: 141 mmol/L (ref 135–145)
Total Bilirubin: 0.6 mg/dL (ref 0.3–1.2)
Total Protein: 7.4 g/dL (ref 6.5–8.1)

## 2019-09-15 LAB — CBC WITH DIFFERENTIAL/PLATELET
Abs Immature Granulocytes: 0.01 10*3/uL (ref 0.00–0.07)
Basophils Absolute: 0.1 10*3/uL (ref 0.0–0.1)
Basophils Relative: 1 %
Eosinophils Absolute: 0.1 10*3/uL (ref 0.0–0.5)
Eosinophils Relative: 3 %
HCT: 37.8 % (ref 36.0–46.0)
Hemoglobin: 12.3 g/dL (ref 12.0–15.0)
Immature Granulocytes: 0 %
Lymphocytes Relative: 18 %
Lymphs Abs: 0.8 10*3/uL (ref 0.7–4.0)
MCH: 31.1 pg (ref 26.0–34.0)
MCHC: 32.5 g/dL (ref 30.0–36.0)
MCV: 95.7 fL (ref 80.0–100.0)
Monocytes Absolute: 0.3 10*3/uL (ref 0.1–1.0)
Monocytes Relative: 7 %
Neutro Abs: 3.1 10*3/uL (ref 1.7–7.7)
Neutrophils Relative %: 71 %
Platelets: 300 10*3/uL (ref 150–400)
RBC: 3.95 MIL/uL (ref 3.87–5.11)
RDW: 14.4 % (ref 11.5–15.5)
WBC: 4.4 10*3/uL (ref 4.0–10.5)
nRBC: 0 % (ref 0.0–0.2)

## 2019-09-15 LAB — VITAMIN D 25 HYDROXY (VIT D DEFICIENCY, FRACTURES): Vit D, 25-Hydroxy: 45.52 ng/mL (ref 30–100)

## 2019-09-15 NOTE — Progress Notes (Signed)
HEMATOLOGY/ONCOLOGY CLINIC NOTE  Date of Service: 09/15/19  PCP: .[name], MD  CC:  F/u for continue mx of multiple myeloma  CHIEF COMPLAINTS/PURPOSE OF CONSULTATION:  Follow up for multiple myeloma  DIAGNOSIS  IgA lambda R-ISS stage II multiple myeloma with innumerable lytic lesions in the calvarium, lesion in T7 vertebra and multiple lesions in the pelvis.  No significant bone pain at this time. Diagnosed in January 2017.  Current treatment  Restarting on Revlimid 49m po daily 3 weeks on 1 week off (after treatment interruption)   Previous Treatment  Status post Vd x 1 cycle VRd x 6 cycles  HD Melphalan 2088mm2 on 03/22/2016 Autologous HSCT on 03/23/2016 [facility] 6.4 x10^6/kg)  -Revlimid maintenance     INTERVAL HISTORY:   Paula CoNeglias here for management and evaluation of her multiple myeloma. The patient's last visit with [place] on 07/15/2019. The pt reports that she is doing well overall.  The pt reports that she has been well in the interim and has had no new concerns. She has recently retired and had a great Thanksgiving holiday. She has had some constipation but does not attribute this to Revlimid as it has been an ongoing issue.    Lab results today (09/15/19) of CBC w/diff and CMP is as follows: all values are WNL except for Glucose at 119. 09/15/2019 Vitamin D 25-hydroxy at 45.52 09/15/2019 MMP is in progress   On review of systems, pt denies fatigue, diarrhea, skin rashes, new bone pains, abdominal pain and any other symptoms.    REVIEW OF SYSTEMS:   A 10+ POINT REVIEW OF SYSTEMS WAS OBTAINED including neurology, dermatology, psychiatry, cardiac, respiratory, lymph, extremities, GI, GU, Musculoskeletal, constitutional, breasts, reproductive, HEENT.  All pertinent positives are noted in the HPI.  All others are negative.    PHYSICAL EXAMINATION: NAD .BP 117/67 (BP Location: Left Arm, Patient Position: Sitting)    Pulse 81   Temp 98 F (36.7 C) (Temporal)   Resp 16   Ht 5' 7.5" (1.715 m)   Wt 141 lb 4.8 oz (64.1 kg)   LMP 09/26/1995 (Within Months)   SpO2 99%   BMI 21.80 kg/m   GENERAL:alert, in no acute distress and comfortable SKIN: no acute rashes, no significant lesions EYES: conjunctiva are pink and non-injected, sclera anicteric OROPHARYNX: MMM, no exudates, no oropharyngeal erythema or ulceration NECK: supple, no JVD LYMPH:  no palpable lymphadenopathy in the cervical, axillary or inguinal regions LUNGS: clear to auscultation b/l with normal respiratory effort HEART: regular rate & rhythm ABDOMEN:  normoactive bowel sounds , non tender, not distended. No palpable hepatosplenomegaly.  Extremity: no pedal edema PSYCH: alert & oriented x 3 with fluent speech NEURO: no focal motor/sensory deficits  MEDICAL HISTORY:   Past Medical History:  Diagnosis Date  . Abnormal Pap smear of vagina 03/22/2017   ASCUS pap and negative HR HPV.  Patient is on immunosuppressive medication.   . Anemia   . AR (aortic regurgitation) 11/30/2017   trace noted on ECHO  . Atrial septal aneurysm per 11-30-17 echo   trivial pericardial effusion  . [place]   squamous cell carcinoma of thymus  . DDD (degenerative disc disease), cervical   . DDD (degenerative disc disease), lumbosacral   . Facet degeneration of lumbar region   . Facet joint disease of cervical region   . Fatigue   . Fatty liver   . Fibroid    reason for Hysterectomy  .  GERD (gastroesophageal reflux disease)   . Grade I diastolic dysfunction   . History of cystocele   . History of stress incontinence   . Hypergammaglobulinemia   . Hyperlipidemia   . Lung nodule   . Migraine    episode of aphasia worked up with echo by [facility] 11-23-17, has visual migraines  . MR (mitral regurgitation) 11/30/2017   trace noted on ECHO  . Multiple myeloma ([place]) 08/2015   metastatic to bone  . Muscular fasciculation   . Nasal  septal deviation   . Nasal turbinate hypertrophy   . Near syncope   . Peripheral edema   . Plasma cell dyscrasia   . PONV (postoperative nausea and vomiting)    thinks morphine caused nausea  . Postoperative urinary retention 05/14/2018  . Restless leg syndrome   . Sleep apnea    uses old oral allialnce for osa due to cpap intolerance  . TR (tricuspid regurgitation) 11/30/2017   trace noted on ECHO  . Urinary incontinence   . UTI (urinary tract infection)   . Wears glasses     SURGICAL HISTORY: Past Surgical History:  Procedure Laterality Date  . ABDOMINAL HYSTERECTOMY  1997   TAH--ovaries remain--Dr. Newton Pigg  . ANTERIOR AND POSTERIOR REPAIR N/A 05/14/2018   Procedure: ANTERIOR (CYSTOCELE) AND POSTERIOR REPAIR (RECTOCELE);  Surgeon: [name], MD;  [facility];  Service: Gynecology;  Laterality: N/A;  . BLADDER SUSPENSION N/A 05/14/2018   Procedure: TRANSVAGINAL TAPE (TVT) PROCEDURE exact midurethral sling;  Surgeon: [name], MD;  [facility];  Service: Gynecology;  Laterality: N/A;  . BONE MARROW TRANSPLANT  2017   done at [place]  . BREAST BIOPSY    . COLONOSCOPY    . CYSTOSCOPY N/A 05/14/2018   Procedure: CYSTOSCOPY;  Surgeon: [name], MD;  [facility];  Service: Gynecology;  Laterality: N/A;  . CYSTOSCOPY N/A 05/30/2018   Procedure: CYSTOSCOPY;  Surgeon: [name], MD;  [facility];  Service: Gynecology;  Laterality: N/A;  . ROBOTIC ASSISTED LAPAROSCOPIC SACROCOLPOPEXY N/A 05/14/2018   Procedure: XI ROBOTIC ASSISTED LAPAROSCOPIC SACROCOLPOPEXY;  Surgeon: [name], MD;  [facility];  Service: Gynecology;  Laterality: N/A;  6 hours OR time. Need extended stay recovery bed.  Marland Kitchen SALPINGOOPHORECTOMY Bilateral 05/14/2018   Procedure: SALPINGO OOPHORECTOMY;  Surgeon: [name], MD;  [facility];  Service: Gynecology;  Laterality:  Bilateral;  . THYMECTOMY  1999  . TRANSVAGINAL TAPE (TVT) REMOVAL N/A 05/30/2018   Procedure: TRANSVAGINAL TAPE (TVT)  Revision;  Surgeon: [name], MD;  Location: [facility];  Service: Gynecology;  Laterality: N/A;  . WISDOM TOOTH EXTRACTION      SOCIAL HISTORY: Social History   Socioeconomic History  . Marital status: Married    Spouse name: [name]  . Number of children: 2  . Years of education: college  . 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 Smoking status: Never Smoker  . Smokeless tobacco: Never Used  Substance and Sexual Activity  . Alcohol use: Yes    Alcohol/week: 0.0 standard drinks    Comment: once a year drink  . Drug use: Never  . Sexual activity: Not Currently    Partners: Male    Birth control/protection: Surgical    Comment:  TAH--ovaries remain  Other Topics Concern  . Not on file  Social History Narrative   Lives with husband   Caffeine- coffee, 2 cups daily   Social Determinants of Health   Financial Resource Strain:   . Difficulty of Paying Living Expenses: Not on file  Food Insecurity:   . Worried About Charity fundraiser in the Last Year: Not on file  . Ran Out of Food in the Last Year: Not on file  Transportation Needs:   . Lack of Transportation (Medical): Not on file  . Lack of Transportation (Non-Medical): Not on file  Physical Activity:   . Days of Exercise per Week: Not on file  . Minutes of Exercise per Session: Not on file  Stress:   . Feeling of Stress : Not on file  Social Connections:   . Frequency of Communication with Friends and Family: Not on file  . Frequency of Social Gatherings with Friends and Family: Not on file  . Attends Religious Services: Not on file  . Active Member of Clubs or Organizations: Not on file  . Attends Archivist Meetings: Not on file  . Marital Status: Not on file  Intimate  Partner Violence:   . Fear of Current or Ex-Partner: Not on file  . Emotionally Abused: Not on file  . Physically Abused: Not on file  . Sexually Abused: Not on file    FAMILY HISTORY: Family History  Problem Relation Age of Onset  . COPD Father        dec age 40/s-smoked  . Macular degeneration Father   . Diabetes Brother   . Other Brother        committed suicide age 69  . Macular degeneration Brother   . Hyperlipidemia Mother   . Diabetes Maternal Grandmother   . Heart attack Paternal Grandfather     ALLERGIES:  is allergic to ampicillin and penicillins.  MEDICATIONS:  . Current Outpatient Medications:  .  aspirin EC 81 MG tablet, Take 81 mg by mouth daily., Disp: , Rfl:  .  b complex vitamins capsule, Take 1 capsule by mouth daily., Disp: , Rfl:  .  Calcium 250 MG CAPS, Take 1,000 mg by mouth daily., Disp: , Rfl:  .  Cholecalciferol (VITAMIN D3) 30 MCG/15ML LIQD, Take by mouth., Disp: , Rfl:  .  estradiol (ESTRACE VAGINAL) 0.1 MG/GM vaginal cream, Place 1/2 gram per vagina at hs twice weekly., Disp: 42.5 g, Rfl: 0 .  lenalidomide (REVLIMID) 10 MG capsule, TAKE ONE CAPSULE BY MOUTH DAILY FOR 21 DAYS, THEN 7 DAYS OFF, Disp: 21 capsule, Rfl: 0 .  Magnesium 400 MG CAPS, Take by mouth., Disp: , Rfl:    LABORATORY DATA:  I have reviewed the data as listed  . CBC Latest Ref Rng & Units 09/15/2019 07/15/2019 05/23/2019  WBC 4.0 - 10.5 K/uL 4.4 5.5 3.0(L)  Hemoglobin 12.0 - 15.0 g/dL 12.3 12.8 12.8  Hematocrit 36.0 - 46.0 % 37.8 38.5 39.1  Platelets 150 - 400 K/uL 300 321 339    . CMP Latest Ref Rng & Units 09/15/2019 07/15/2019 05/23/2019  Glucose 70 - 99 mg/dL 119(H) 97 97  BUN 8 - 23 mg/dL _0 Creatinine 0.44 - 1.00 mg/dL 0.96 0.76 0.78  Sodium 135 - 145 mmol/L 141 140 139  Potassium 3.5 - 5.1 mmol/L 4.0 4.0 4.1  Chloride 98 - 111 mmol/L 105 106 107  CO2 22 - 32 mmol/L _1 Calcium 8.9 -  10.3 mg/dL 9.1 9.4 9.1  Total Protein 6.5 - 8.1 g/dL 7.4 7.6 7.4    Total Bilirubin 0.3 - 1.2 mg/dL 0.6 0.9 0.8  Alkaline Phos 38 - 126 U/L 73 77 77  AST 15 - 41 U/L _0 ALT 0 - 44 U/L _1 10/07/18 Repeat BM Bx from [facility]:    RADIOGRAPHIC STUDIES: I have personally reviewed the radiological images as listed and agreed with the findings in the report. No results found.  ASSESSMENT & PLAN:   63 y.o. caucasian female with  1. H/o Multiple myeloma - currently in remission.  IgA lambda R-ISS stage II multiple myeloma with innumerable lytic lesions in the calvarium, lesion in T7 vertebra and multiple lesions in the pelvis.  No significant bone pain at this time. Diagnosed in January 2017. s/p treatment as noted above 10/07/18 BM Bx and Flow cytometry indicate that the pt continues to be in CR  2.Pre-op assessment for myeloma Held Revlimid from 04/30/18 through her 05/14/18 uterine lift procedure and the bunionectomy on 06/25/18 to decrease risk of clots    PLAN:  -Discussed pt labwork today, 09/15/19; blood counts and chemistries look good -Discussed 09/15/2019 Vitamin D 25-hydroxy at 45.52 -Discussed 09/15/2019 MMP is in progress  -Discussed 07/15/2019 MMP shows no M Protein observed -Pt's Multiple Myeloma continues to be in remission  -The pt has no prohibitive toxicities from continuing maintenance Revlimid at this time.  -Continue ASA for VTE prophylaxis -Will see back in 2 months with labs    FOLLOW UP: RTC with Dr Irene Limbo with labs in 2 months   The total time spent in the appt was 15 minutes and more than 50% was on counseling and direct patient cares.  All of the patient's questions were answered with apparent satisfaction. The patient knows to call the clinic with any problems, questions or concerns.   Paula Lone MD [place] AAHIVMS [facility] Hematology/Oncology Physician [facility]  (Office):       [phone] (Work cell):  [phone] (Fax):           [phone]   I, Paula Johnson, am acting as a  scribe for Dr. Sullivan Johnson.   .I have reviewed the above documentation for accuracy and completeness, and I agree with the above. Paula Genera MD

## 2019-09-15 NOTE — Telephone Encounter (Signed)
Scheduled per los. Patient declined printout  

## 2019-09-17 LAB — MULTIPLE MYELOMA PANEL, SERUM
Albumin SerPl Elph-Mcnc: 3.7 g/dL (ref 2.9–4.4)
Albumin/Glob SerPl: 1.2 (ref 0.7–1.7)
Alpha 1: 0.2 g/dL (ref 0.0–0.4)
Alpha2 Glob SerPl Elph-Mcnc: 0.7 g/dL (ref 0.4–1.0)
B-Globulin SerPl Elph-Mcnc: 1.1 g/dL (ref 0.7–1.3)
Gamma Glob SerPl Elph-Mcnc: 1.1 g/dL (ref 0.4–1.8)
Globulin, Total: 3.1 g/dL (ref 2.2–3.9)
IgA: 408 mg/dL — ABNORMAL HIGH (ref 87–352)
IgG (Immunoglobin G), Serum: 1148 mg/dL (ref 586–1602)
IgM (Immunoglobulin M), Srm: 38 mg/dL (ref 26–217)
Total Protein ELP: 6.8 g/dL (ref 6.0–8.5)

## 2019-09-30 ENCOUNTER — Other Ambulatory Visit: Payer: Self-pay | Admitting: *Deleted

## 2019-09-30 DIAGNOSIS — C9001 Multiple myeloma in remission: Secondary | ICD-10-CM

## 2019-09-30 MED ORDER — LENALIDOMIDE 10 MG PO CAPS: ORAL_CAPSULE | ORAL | 0 refills | Status: DC

## 2019-09-30 NOTE — Telephone Encounter (Signed)
Received faxed refill request for Revlimid from [facility] Refilled per [name] OV note 09/15/2019 - escribed to [place], [address] [place] [phone], 09/30/2019

## 2019-10-07 ENCOUNTER — Ambulatory Visit: Payer: BC Managed Care – PPO

## 2019-10-07 ENCOUNTER — Other Ambulatory Visit: Payer: BC Managed Care – PPO

## 2019-10-07 ENCOUNTER — Ambulatory Visit: Payer: BC Managed Care – PPO | Admitting: Hematology

## 2019-10-21 ENCOUNTER — Ambulatory Visit: Payer: BLUE CROSS/BLUE SHIELD

## 2019-10-24 ENCOUNTER — Other Ambulatory Visit: Payer: Self-pay | Admitting: *Deleted

## 2019-10-24 DIAGNOSIS — C9001 Multiple myeloma in remission: Secondary | ICD-10-CM

## 2019-10-24 MED ORDER — LENALIDOMIDE 10 MG PO CAPS: ORAL_CAPSULE | ORAL | 0 refills | Status: DC

## 2019-10-24 NOTE — Telephone Encounter (Signed)
Received faxed refill request from [facility] for Revlimid Refilled per OV note 09/15/19 Refill escribed to [place], [address] [facility] Auth# [id number], 10/24/2019

## 2019-11-10 ENCOUNTER — Inpatient Hospital Stay: Payer: Medicare Other | Attending: Hematology | Admitting: Hematology

## 2019-11-10 ENCOUNTER — Inpatient Hospital Stay: Payer: Medicare Other

## 2019-11-10 ENCOUNTER — Other Ambulatory Visit: Payer: Self-pay

## 2019-11-10 VITALS — BP 115/72 | HR 78 | Temp 98.0°F | Resp 17 | Ht 67.5 in | Wt 143.0 lb

## 2019-11-10 DIAGNOSIS — Z8249 Family history of ischemic heart disease and other diseases of the circulatory system: Secondary | ICD-10-CM | POA: Insufficient documentation

## 2019-11-10 DIAGNOSIS — Z7982 Long term (current) use of aspirin: Secondary | ICD-10-CM | POA: Insufficient documentation

## 2019-11-10 DIAGNOSIS — C9001 Multiple myeloma in remission: Secondary | ICD-10-CM | POA: Diagnosis present

## 2019-11-10 DIAGNOSIS — Z90722 Acquired absence of ovaries, bilateral: Secondary | ICD-10-CM | POA: Insufficient documentation

## 2019-11-10 DIAGNOSIS — Z833 Family history of diabetes mellitus: Secondary | ICD-10-CM | POA: Diagnosis not present

## 2019-11-10 DIAGNOSIS — Z9079 Acquired absence of other genital organ(s): Secondary | ICD-10-CM | POA: Insufficient documentation

## 2019-11-10 DIAGNOSIS — E559 Vitamin D deficiency, unspecified: Secondary | ICD-10-CM

## 2019-11-10 DIAGNOSIS — Z90710 Acquired absence of both cervix and uterus: Secondary | ICD-10-CM | POA: Insufficient documentation

## 2019-11-10 DIAGNOSIS — Z79899 Other long term (current) drug therapy: Secondary | ICD-10-CM | POA: Diagnosis not present

## 2019-11-10 LAB — CBC WITH DIFFERENTIAL/PLATELET
Abs Immature Granulocytes: 0.01 10*3/uL (ref 0.00–0.07)
Basophils Absolute: 0.1 10*3/uL (ref 0.0–0.1)
Basophils Relative: 1 %
Eosinophils Absolute: 0.2 10*3/uL (ref 0.0–0.5)
Eosinophils Relative: 3 %
HCT: 40.7 % (ref 36.0–46.0)
Hemoglobin: 13.3 g/dL (ref 12.0–15.0)
Immature Granulocytes: 0 %
Lymphocytes Relative: 14 %
Lymphs Abs: 0.8 10*3/uL (ref 0.7–4.0)
MCH: 30.7 pg (ref 26.0–34.0)
MCHC: 32.7 g/dL (ref 30.0–36.0)
MCV: 94 fL (ref 80.0–100.0)
Monocytes Absolute: 0.4 10*3/uL (ref 0.1–1.0)
Monocytes Relative: 6 %
Neutro Abs: 4.5 10*3/uL (ref 1.7–7.7)
Neutrophils Relative %: 76 %
Platelets: 299 10*3/uL (ref 150–400)
RBC: 4.33 MIL/uL (ref 3.87–5.11)
RDW: 14.2 % (ref 11.5–15.5)
WBC: 5.9 10*3/uL (ref 4.0–10.5)
nRBC: 0 % (ref 0.0–0.2)

## 2019-11-10 LAB — CMP (CANCER CENTER ONLY)
ALT: 28 U/L (ref 0–44)
AST: 21 U/L (ref 15–41)
Albumin: 4.1 g/dL (ref 3.5–5.0)
Alkaline Phosphatase: 78 U/L (ref 38–126)
Anion gap: 10 (ref 5–15)
BUN: 17 mg/dL (ref 8–23)
CO2: 28 mmol/L (ref 22–32)
Calcium: 9.3 mg/dL (ref 8.9–10.3)
Chloride: 102 mmol/L (ref 98–111)
Creatinine: 1.17 mg/dL — ABNORMAL HIGH (ref 0.44–1.00)
GFR, Est AFR Am: 57 mL/min — ABNORMAL LOW (ref >60)
GFR, Estimated: 49 mL/min — ABNORMAL LOW (ref >60)
Glucose, Bld: 92 mg/dL (ref 70–99)
Potassium: 4 mmol/L (ref 3.5–5.1)
Sodium: 140 mmol/L (ref 135–145)
Total Bilirubin: 0.8 mg/dL (ref 0.3–1.2)
Total Protein: 8 g/dL (ref 6.5–8.1)

## 2019-11-10 LAB — VITAMIN D 25 HYDROXY (VIT D DEFICIENCY, FRACTURES): Vit D, 25-Hydroxy: 41.58 ng/mL (ref 30–100)

## 2019-11-10 NOTE — Progress Notes (Signed)
HEMATOLOGY/ONCOLOGY CLINIC NOTE  Date of Service: 11/10/19  PCP: .[name], MD  CC:  F/u for continue mx of multiple myeloma  CHIEF COMPLAINTS/PURPOSE OF CONSULTATION:  Follow up for multiple myeloma  DIAGNOSIS  IgA lambda R-ISS stage II multiple myeloma with innumerable lytic lesions in the calvarium, lesion in T7 vertebra and multiple lesions in the pelvis.  No significant bone pain at this time. Diagnosed in January 2017.  Current treatment  Restarting on Revlimid 30m po daily 3 weeks on 1 week off (after treatment interruption)   Previous Treatment  Status post Vd x 1 cycle VRd x 6 cycles  HD Melphalan 2057mm2 on 03/22/2016 Autologous HSCT on 03/23/2016 [facility] 6.4 x10^6/kg)  Current rx -Revlimid maintenance     INTERVAL HISTORY:   Ms CoSoleckis here for management and evaluation of her multiple myeloma. The patient's last visit with [place] on 09/15/2019. The pt reports that she is doing well overall.  The pt reports that she has been well and has the second dose of the COVID19 vaccine scheduled for tomorrow. Pt saw Dr. GaAlvie Heidelbergho went over her BM Bx. She advised the pt that she is showing no sign of disease at this time, according to the lab work.  Of note since the patient's last visit, pt has had Flow Cytometry completed on 10/10/2019 with results revealing "A. Bone Marrow (Plasma Cell Myeloma Minimal Residual Disease Detection by Flow Cytometry): Negative. No phenotypically abnormal plasma cells at or above the limit of detection identified."  Lab results today (11/10/19) of CBC w/diff and CMP is as follows: all values are WNL except for Creatinine at 1.17, GFR Est Non Af Am at 49. 11/10/2019 Vitamin D 25 hydroxy is in progress 11/10/2019 MMP is in progress  On review of systems, pt reports muscle cramps and denies nausea, diarrhea and any other symptoms.   REVIEW OF SYSTEMS:   A 10+ POINT REVIEW OF SYSTEMS WAS  OBTAINED including neurology, dermatology, psychiatry, cardiac, respiratory, lymph, extremities, GI, GU, Musculoskeletal, constitutional, breasts, reproductive, HEENT.  All pertinent positives are noted in the HPI.  All others are negative.   PHYSICAL EXAMINATION: NAD .BP 115/72 (BP Location: Left Arm, Patient Position: Sitting)   Pulse 78   Temp 98 F (36.7 C) (Temporal)   Resp 17   Ht 5' 7.5" (1.715 m)   Wt 143 lb (64.9 kg)   LMP 09/26/1995 (Within Months)   SpO2 99%   BMI 22.07 kg/m   GENERAL:alert, in no acute distress and comfortable SKIN: no acute rashes, no significant lesions EYES: conjunctiva are pink and non-injected, sclera anicteric OROPHARYNX: MMM, no exudates, no oropharyngeal erythema or ulceration NECK: supple, no JVD LYMPH:  no palpable lymphadenopathy in the cervical, axillary or inguinal regions LUNGS: clear to auscultation b/l with normal respiratory effort HEART: regular rate & rhythm ABDOMEN:  normoactive bowel sounds , non tender, not distended. No palpable hepatosplenomegaly.  Extremity: no pedal edema PSYCH: alert & oriented x 3 with fluent speech NEURO: no focal motor/sensory deficits  MEDICAL HISTORY:   Past Medical History:  Diagnosis Date  . Abnormal Pap smear of vagina 03/22/2017   ASCUS pap and negative HR HPV.  Patient is on immunosuppressive medication.   . Anemia   . AR (aortic regurgitation) 11/30/2017   trace noted on ECHO  . Atrial septal aneurysm per 11-30-17 echo   trivial pericardial effusion  . [place]   squamous cell carcinoma of thymus  .  DDD (degenerative disc disease), cervical   . DDD (degenerative disc disease), lumbosacral   . Facet degeneration of lumbar region   . Facet joint disease of cervical region   . Fatigue   . Fatty liver   . Fibroid    reason for Hysterectomy  . GERD (gastroesophageal reflux disease)   . Grade I diastolic dysfunction   . History of cystocele   . History of stress incontinence   .  Hypergammaglobulinemia   . Hyperlipidemia   . Lung nodule   . Migraine    episode of aphasia worked up with echo by [facility] 11-23-17, has visual migraines  . MR (mitral regurgitation) 11/30/2017   trace noted on ECHO  . Multiple myeloma ([place]) 08/2015   metastatic to bone  . Muscular fasciculation   . Nasal septal deviation   . Nasal turbinate hypertrophy   . Near syncope   . Peripheral edema   . Plasma cell dyscrasia   . PONV (postoperative nausea and vomiting)    thinks morphine caused nausea  . Postoperative urinary retention 05/14/2018  . Restless leg syndrome   . Sleep apnea    uses old oral allialnce for osa due to cpap intolerance  . TR (tricuspid regurgitation) 11/30/2017   trace noted on ECHO  . Urinary incontinence   . UTI (urinary tract infection)   . Wears glasses     SURGICAL HISTORY: Past Surgical History:  Procedure Laterality Date  . ABDOMINAL HYSTERECTOMY  1997   TAH--ovaries remain--Dr. Newton Pigg  . ANTERIOR AND POSTERIOR REPAIR N/A 05/14/2018   Procedure: ANTERIOR (CYSTOCELE) AND POSTERIOR REPAIR (RECTOCELE);  Surgeon: [name], MD;  Location: [facility];  Service: Gynecology;  Laterality: N/A;  . BLADDER SUSPENSION N/A 05/14/2018   Procedure: TRANSVAGINAL TAPE (TVT) PROCEDURE exact midurethral sling;  Surgeon: [name], MD;  Location: [facility];  Service: Gynecology;  Laterality: N/A;  . BONE MARROW TRANSPLANT  2017   done at [place]  . BREAST BIOPSY    . COLONOSCOPY    . CYSTOSCOPY N/A 05/14/2018   Procedure: CYSTOSCOPY;  Surgeon: [name], MD;  Location: [facility];  Service: Gynecology;  Laterality: N/A;  . CYSTOSCOPY N/A 05/30/2018   Procedure: CYSTOSCOPY;  Surgeon: [name], MD;  Location: [facility];  Service: Gynecology;  Laterality: N/A;  . ROBOTIC ASSISTED LAPAROSCOPIC SACROCOLPOPEXY N/A 05/14/2018   Procedure: XI ROBOTIC ASSISTED LAPAROSCOPIC SACROCOLPOPEXY;   Surgeon: [name], MD;  Location: [facility];  Service: Gynecology;  Laterality: N/A;  6 hours OR time. Need extended stay recovery bed.  Marland Kitchen SALPINGOOPHORECTOMY Bilateral 05/14/2018   Procedure: SALPINGO OOPHORECTOMY;  Surgeon: [name], MD;  Location: [facility];  Service: Gynecology;  Laterality: Bilateral;  . THYMECTOMY  1999  . TRANSVAGINAL TAPE (TVT) REMOVAL N/A 05/30/2018   Procedure: TRANSVAGINAL TAPE (TVT)  Revision;  Surgeon: [name], MD;  Location[facility];  Service: Gynecology;  Laterality: N/A;  . WISDOM TOOTH EXTRACTION      SOCIAL HISTORY: Social History   Socioeconomic History  . Marital status: Married    Spouse name: [name]  . Number of children: 2  . Years of education: college  . 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 Smoking status: Never Smoker  . Smokeless tobacco: Never Used  Substance and Sexual Activity  . Alcohol use: Yes    Alcohol/week: 0.0 standard drinks    Comment: once a year drink  . Drug use: Never  . Sexual activity: Not Currently    Partners: Male    Birth control/protection: Surgical    Comment: TAH--ovaries remain  Other Topics Concern  . Not on file  Social History Narrative   Lives with husband   Caffeine- coffee, 2 cups daily   Social Determinants of Health   Financial Resource Strain:   . Difficulty of Paying Living Expenses: Not on file  Food Insecurity:   . Worried About Charity fundraiser in the Last Year: Not on file  . Ran Out of Food in the Last Year: Not on file  Transportation Needs:   . Lack of Transportation (Medical): Not on file  . Lack of Transportation (Non-Medical): Not on file  Physical Activity:   . Days of Exercise per Week: Not on file  . Minutes of Exercise per Session: Not on file  Stress:   . Feeling of Stress : Not on file  Social Connections:   . Frequency of  Communication with Friends and Family: Not on file  . Frequency of Social Gatherings with Friends and Family: Not on file  . Attends Religious Services: Not on file  . Active Member of Clubs or Organizations: Not on file  . Attends Archivist Meetings: Not on file  . Marital Status: Not on file  Intimate Partner Violence:   . Fear of Current or Ex-Partner: Not on file  . Emotionally Abused: Not on file  . Physically Abused: Not on file  . Sexually Abused: Not on file    FAMILY HISTORY: Family History  Problem Relation Age of Onset  . COPD Father        dec age 42/s-smoked  . Macular degeneration Father   . Diabetes Brother   . Other Brother        committed suicide age 46  . Macular degeneration Brother   . Hyperlipidemia Mother   . Diabetes Maternal Grandmother   . Heart attack Paternal Grandfather     ALLERGIES:  is allergic to ampicillin and penicillins.  MEDICATIONS:  . Current Outpatient Medications:  .  aspirin EC 81 MG tablet, Take 81 mg by mouth daily., Disp: , Rfl:  .  b complex vitamins capsule, Take 1 capsule by mouth daily., Disp: , Rfl:  .  Calcium 250 MG CAPS, Take 1,000 mg by mouth daily., Disp: , Rfl:  .  Cholecalciferol (VITAMIN D3) 30 MCG/15ML LIQD, Take by mouth., Disp: , Rfl:  .  estradiol (ESTRACE VAGINAL) 0.1 MG/GM vaginal cream, Place 1/2 gram per vagina at hs twice weekly., Disp: 42.5 g, Rfl: 0 .  lenalidomide (REVLIMID) 10 MG capsule, TAKE ONE CAPSULE BY MOUTH DAILY FOR 21 DAYS, THEN 7 DAYS OFF, Disp: 21 capsule, Rfl: 0 .  Magnesium 400 MG CAPS, Take by mouth., Disp: , Rfl:    LABORATORY DATA:  I have reviewed the data as listed  . CBC Latest Ref Rng & Units 11/10/2019 09/15/2019 07/15/2019  WBC 4.0 - 10.5 K/uL 5.9 4.4 5.5  Hemoglobin 12.0 - 15.0 g/dL 13.3 12.3 12.8  Hematocrit 36.0 - 46.0 % 40.7 37.8 38.5  Platelets 150 - 400 K/uL 299 300 321    . CMP Latest Ref Rng & Units 11/10/2019 09/15/2019 07/15/2019  Glucose 70 - 99 mg/dL  92 119(H) 97  BUN 8 - 23 mg/dL  17 15 17   Creatinine 0.44 - 1.00 mg/dL 1.17(H) 0.96 0.76  Sodium 135 - 145 mmol/L 140 141 140  Potassium 3.5 - 5.1 mmol/L 4.0 4.0 4.0  Chloride 98 - 111 mmol/L 102 105 106  CO2 22 - 32 mmol/L 28 25 23   Calcium 8.9 - 10.3 mg/dL 9.3 9.1 9.4  Total Protein 6.5 - 8.1 g/dL 8.0 7.4 7.6  Total Bilirubin 0.3 - 1.2 mg/dL 0.8 0.6 0.9  Alkaline Phos 38 - 126 U/L 78 73 77  AST 15 - 41 U/L 21 24 20   ALT 0 - 44 U/L 28 28 27    10/10/2019 Flow Cytometry (Repeat BM Bx from [facility]):   10/07/18 Repeat BM Bx from [facility]:    RADIOGRAPHIC STUDIES: I have personally reviewed the radiological images as listed and agreed with the findings in the report. No results found.  ASSESSMENT & PLAN:   64 y.o. caucasian female with  1. H/o Multiple myeloma - currently in remission.  IgA lambda R-ISS stage II multiple myeloma with innumerable lytic lesions in the calvarium, lesion in T7 vertebra and multiple lesions in the pelvis.  No significant bone pain at this time. Diagnosed in January 2017. s/p treatment as noted above 10/07/18 BM Bx and Flow cytometry indicate that the pt continues to be in CR  2.Pre-op assessment for myeloma Held Revlimid from 04/30/18 through her 05/14/18 uterine lift procedure and the bunionectomy on 06/25/18 to decrease risk of clots  PLAN:  -Discussed pt labwork today, 11/10/19; blood counts and chemistries are stable  -Discussed 11/10/2019 Vitamin D 25 hydroxy is in progress -Discussed 11/10/2019 MMP is in progress, 09/15/2019 MMP shows no M Spike  -Discussed 10/10/2019 Flow Cytometry which revealed "A. Bone Marrow (Plasma Cell Myeloma Minimal Residual Disease Detection by Flow Cytometry): Negative. No phenotypically abnormal plasma cells at or above the limit of detection identified." -Pt's Multiple Myeloma continues to be in remission  -Discussed continuing maintenance Revlimid vs discontinuing treatment at this time -Advised pt that Revlimid does carry  an increased risk of blood clots and some secondary malignancies but will lengthen time to progression -Will continue Revlimid for maintenance at this time - pt agrees  -The pt has no prohibitive toxicities from continuing maintenance Revlimid at this time -Continue ASA for VTE prophylaxis -Will see back in 2 months with labs   FOLLOW UP: RTC with Dr Irene Limbo with labs in 2 months   The total time spent in the appt was 20 minutes and more than 50% was on counseling and direct patient cares.  All of the patient's questions were answered with apparent satisfaction. The patient knows to call the clinic with any problems, questions or concerns.   Sullivan Lone MD [place] AAHIVMS [facility] Hematology/Oncology Physician [facility]  (Office):       [phone] (Work cell):  [phone] (Fax):           [phone]   I, Yevette Edwards, am acting as a scribe for Dr. Sullivan Lone.   .I have reviewed the above documentation for accuracy and completeness, and I agree with the above. Brunetta Genera MD

## 2019-11-11 ENCOUNTER — Ambulatory Visit: Payer: BLUE CROSS/BLUE SHIELD

## 2019-11-12 ENCOUNTER — Telehealth: Payer: Self-pay | Admitting: Hematology

## 2019-11-12 NOTE — Telephone Encounter (Signed)
Scheduled per 02/15 los, patient has been called and notified. 

## 2019-11-13 ENCOUNTER — Encounter: Payer: Self-pay | Admitting: Hematology

## 2019-11-13 LAB — MULTIPLE MYELOMA PANEL, SERUM
Albumin SerPl Elph-Mcnc: 4 g/dL (ref 2.9–4.4)
Albumin/Glob SerPl: 1.3 (ref 0.7–1.7)
Alpha 1: 0.2 g/dL (ref 0.0–0.4)
Alpha2 Glob SerPl Elph-Mcnc: 0.7 g/dL (ref 0.4–1.0)
B-Globulin SerPl Elph-Mcnc: 1.2 g/dL (ref 0.7–1.3)
Gamma Glob SerPl Elph-Mcnc: 1.1 g/dL (ref 0.4–1.8)
Globulin, Total: 3.3 g/dL (ref 2.2–3.9)
IgA: 468 mg/dL — ABNORMAL HIGH (ref 87–352)
IgG (Immunoglobin G), Serum: 1183 mg/dL (ref 586–1602)
IgM (Immunoglobulin M), Srm: 47 mg/dL (ref 26–217)
Total Protein ELP: 7.3 g/dL (ref 6.0–8.5)

## 2019-11-14 ENCOUNTER — Encounter: Payer: Self-pay | Admitting: *Deleted

## 2019-11-25 NOTE — Progress Notes (Signed)
Cardiology Office Note:    Date:  11/26/2019   ID:  Paula Johnson, DOB [birth year], MRN [id number]  PCP:  Paula Contras, MD  Cardiologist:  No primary care provider on file.   Referring MD: Paula Contras, MD   Chief Complaint  Patient presents with  . Advice Only    Heart murmur    History of Present Illness:    Paula Johnson is a 64 y.o. female with a hx of atrial septal aneurysm here for evaluation.  Medical history background includes aortic regurgitation (trivial by prior echo), hyperlipidemia, OSA, multiple myeloma,    Paula Johnson is a daughter of Paula Johnson, also a patient.  She is here because her OB/GYN identified a heart murmur.  She has no symptoms.  Significant prior history includes palpitations in the past which led to a stress echo which was abnormal which then led to a coronary angiogram which did not reveal any significant obstructive disease (2009) with elevated left ventricular end-diastolic pressure of 18 mmHg.  The patient exercises regularly.  She denies orthopnea, PND, exertional chest pain, syncope, palpitations, edema, and claudication.  She has had no prolonged palpitations.  Past Medical History:  Diagnosis Date  . Abdominal pain   . Abnormal Pap smear of vagina 03/22/2017   ASCUS pap and negative HR HPV.  Patient is on immunosuppressive medication.   . Anemia   . AR (aortic regurgitation) 11/30/2017   trace noted on ECHO  . Atrial septal aneurysm per 11-30-17 echo   trivial pericardial effusion  . Bunion 05/13/2018  . Cancer ([place]) 1999   squamous cell carcinoma of thymus  . Counseling regarding advanced care planning and goals of care 08/03/2017  . Cystocele, unspecified (CODE) 03/06/2018  . DDD (degenerative disc disease), cervical   . DDD (degenerative disc disease), lumbosacral   . DYSPNEA 12/04/2007   Qualifier: Diagnosis of  By: Gwenette Greet MD, Armando Reichert   . Facet degeneration of lumbar region   . Facet joint disease of cervical region   . Fatigue     . Fatty liver   . Fibroid    reason for Hysterectomy  . FTT (failure to thrive) in adult 08/09/2017  . Gastroesophageal reflux disease without esophagitis 01/17/2017  . GERD (gastroesophageal reflux disease)   . Grade I diastolic dysfunction   . Hematuria 11/08/2015  . History of cystocele   . History of stress incontinence   . Hypergammaglobulinemia   . Hyperlipidemia   . HYPERLIPIDEMIA 12/04/2007   Qualifier: Diagnosis of  By: Gwenette Greet MD, Armando Reichert   . Hypoxemia 12/16/2007   Qualifier: Diagnosis of  By: Gwenette Greet MD, Armando Reichert   . Lung nodule   . MALIGNANT NEOPLASM OF THYMUS 12/03/2007   Annotation: resection Qualifier: History of  By: Doy Mince LPN, Megan    . Metastatic multiple myeloma to bone ([place]) 10/19/2015  . Migraine    episode of aphasia worked up with echo by [facility] 11-23-17, has visual migraines  . MR (mitral regurgitation) 11/30/2017   trace noted on ECHO  . Multiple myeloma ([place]) 08/2015   metastatic to bone  . Muscular fasciculation   . Nasal septal deviation   . Nasal turbinate hypertrophy   . Near syncope   . OBSTRUCTIVE SLEEP APNEA 12/03/2007   Qualifier: Diagnosis of  By: Doy Mince LPN, Megan    . Other fatigue   . Peripheral edema   . Plasma cell dyscrasia   . PONV (postoperative nausea and vomiting)    thinks morphine  caused nausea  . Postoperative urinary retention 05/14/2018  . Rash 11/01/2015  . Restless leg syndrome   . S/P autologous bone marrow transplantation ([place]) 03/29/2016  . Sleep apnea    uses old oral allialnce for osa due to cpap intolerance  . Status post surgery 05/14/2018  . TR (tricuspid regurgitation) 11/30/2017   trace noted on ECHO  . Urinary incontinence   . Urinary tract infection without hematuria   . UTI (urinary tract infection)   . Vaginal granulation tissue 12/03/2017  . Wears glasses     Past Surgical History:  Procedure Laterality Date  . ABDOMINAL HYSTERECTOMY  1997   TAH--ovaries remain--Dr. Newton Pigg  . ANTERIOR  AND POSTERIOR REPAIR N/A 05/14/2018   Procedure: ANTERIOR (CYSTOCELE) AND POSTERIOR REPAIR (RECTOCELE);  Surgeon: [name], MD;  Location: [facility];  Service: Gynecology;  Laterality: N/A;  . BLADDER SUSPENSION N/A 05/14/2018   Procedure: TRANSVAGINAL TAPE (TVT) PROCEDURE exact midurethral sling;  Surgeon: [name], MD;  Location: [facility];  Service: Gynecology;  Laterality: N/A;  . BONE MARROW TRANSPLANT  2017   done at [facility]  . BREAST BIOPSY    . COLONOSCOPY    . CYSTOSCOPY N/A 05/14/2018   Procedure: CYSTOSCOPY;  Surgeon: [name], MD;  Location: [facility];  Service: Gynecology;  Laterality: N/A;  . CYSTOSCOPY N/A 05/30/2018   Procedure: CYSTOSCOPY;  Surgeon: [name], MD;  Location: [facility];  Service: Gynecology;  Laterality: N/A;  . ROBOTIC ASSISTED LAPAROSCOPIC SACROCOLPOPEXY N/A 05/14/2018   Procedure: XI ROBOTIC ASSISTED LAPAROSCOPIC SACROCOLPOPEXY;  Surgeon: [name], MD;  Location: [facility];  Service: Gynecology;  Laterality: N/A;  6 hours OR time. Need extended stay recovery bed.  Marland Kitchen SALPINGOOPHORECTOMY Bilateral 05/14/2018   Procedure: SALPINGO OOPHORECTOMY;  Surgeon: [name], MD;  Location: [facility];  Service: Gynecology;  Laterality: Bilateral;  . THYMECTOMY  1999  . TRANSVAGINAL TAPE (TVT) REMOVAL N/A 05/30/2018   Procedure: TRANSVAGINAL TAPE (TVT)  Revision;  Surgeon: [name], MD;  Location: [facility];  Service: Gynecology;  Laterality: N/A;  . WISDOM TOOTH EXTRACTION      Current Medications: Current Meds  Medication Sig  . aspirin EC 81 MG tablet Take 81 mg by mouth daily.  Marland Kitchen b complex vitamins capsule Take 1 capsule by mouth daily.  . Calcium 250 MG CAPS Take 1,000 mg by mouth daily.  . Cholecalciferol (VITAMIN D3) 30 MCG/15ML LIQD Take by mouth.  . estradiol (ESTRACE VAGINAL) 0.1 MG/GM vaginal cream Place 1/2 gram per vagina at hs  twice weekly.  Marland Kitchen lenalidomide (REVLIMID) 10 MG capsule TAKE ONE CAPSULE BY MOUTH DAILY FOR 21 DAYS, THEN 7 DAYS OFF  . Magnesium 400 MG CAPS Take by mouth.     Allergies:   Ampicillin and Penicillins   Social History   Socioeconomic History  . Marital status: Married    Spouse name: [name]  . Number of children: 2  . Years of education: college  . 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 Smoking status: Never Smoker  . Smokeless tobacco: Never Used  Substance and Sexual Activity  . Alcohol use: Yes    Alcohol/week: 0.0 standard drinks    Comment: once a year drink  . Drug use: Never  . Sexual activity:  Not Currently    Partners: Male    Birth control/protection: Surgical    Comment: TAH--ovaries remain  Other Topics Concern  . Not on file  Social History Narrative   Lives with husband   Caffeine- coffee, 2 cups daily   Social Determinants of Health   Financial Resource Strain:   . Difficulty of Paying Living Expenses: Not on file  Food Insecurity:   . Worried About Charity fundraiser in the Last Year: Not on file  . Ran Out of Food in the Last Year: Not on file  Transportation Needs:   . Lack of Transportation (Medical): Not on file  . Lack of Transportation (Non-Medical): Not on file  Physical Activity:   . Days of Exercise per Week: Not on file  . Minutes of Exercise per Session: Not on file  Stress:   . Feeling of Stress : Not on file  Social Connections:   . Frequency of Communication with Friends and Family: Not on file  . Frequency of Social Gatherings with Friends and Family: Not on file  . Attends Religious Services: Not on file  . Active Member of Clubs or Organizations: Not on file  . Attends Archivist Meetings: Not on file  . Marital Status: Not on file     Family History: The patient's family history includes COPD in her father; Diabetes in her brother and  maternal grandmother; Heart attack in her paternal grandfather; Hyperlipidemia in her mother; Macular degeneration in her brother and father; Other in her brother.  ROS:   Please see the history of present illness.    Doing great after the bone marrow replacement for multiple myeloma.  She is also has squamous cell carcinoma of the thymus with resection in the past.  All other systems reviewed and are negative.  EKGs/Labs/Other Studies Reviewed:    The following studies were reviewed today:  Cardiac catheterization 2009:  Hyperdynamic left ventricle with mildly elevated LVEDP of 18 mmHg.  No obstructive coronary disease was identified.  Prior 2D Doppler echocardiogram 2017 at [facility]:  INTERPRETATION ---------------------------------------------------------------   NORMAL LEFT VENTRICULAR SYSTOLIC FUNCTION WITH MILD LVH   NORMAL LA PRESSURES WITH NORMAL DIASTOLIC FUNCTION   NORMAL RIGHT VENTRICULAR SYSTOLIC FUNCTION   VALVULAR REGURGITATION: TRIVIAL AR, TRIVIAL MR, TRIVIAL PR, TRIVIAL TR   NO VALVULAR STENOSIS   TRIVIAL PERICARDIAL EFFUSION   NO PRIOR STUDY FOR COMPARISON   3D acquisition and reconstructions were performed as part of this   examination to more accurately quantify the effects of Pre/Post Chemo,   Radiation or other therapy.  EKG:  EKG normal sinus rhythm, right bundle branch block, left atrial abnormality, vertical axis, Q waves II, III, aVF, and V3 through V6.  When compared to the prior tracing, the right bundle branch block is new.  Recent Labs: 11/10/2019: ALT 28; BUN 17; Creatinine 1.17; Hemoglobin 13.3; Platelets 299; Potassium 4.0; Sodium 140  Recent Lipid Panel No results found for: CHOL, TRIG, HDL, CHOLHDL, VLDL, LDLCALC, LDLDIRECT  Physical Exam:    VS:  BP 108/66   Pulse 97   Ht 5' 7"  (1.702 m)   Wt 139 lb 3.2 oz (63.1 kg)   LMP 09/26/1995 (Within Months)   SpO2 96%   BMI 21.80 kg/m     Wt Readings from Last 3 Encounters:   11/26/19 139 lb 3.2 oz (63.1 kg)  11/10/19 143 lb (64.9 kg)  09/15/19 141 lb 4.8 oz (64.1 kg)  GEN: Slender.. No acute distress HEENT: Normal NECK: No JVD. LYMPHATICS: No lymphadenopathy CARDIAC: A 2-3 over 6 right upper sternal and left mid sternal border systolic murmur.  In the mid sternal area while supine, there is question of multiple early systolic clicks.  Murmur is not heard in the axilla.  The murmur decreases in intensity with standing.  RRR without diastolic murmur, gallop, or edema. VASCULAR:  Normal Pulses. No bruits. RESPIRATORY:  Clear to auscultation without rales, wheezing or rhonchi  ABDOMEN: Soft, non-tender, non-distended, No pulsatile mass, MUSCULOSKELETAL: No deformity  SKIN: Warm and dry NEUROLOGIC:  Alert and oriented x 3 PSYCHIATRIC:  Normal affect   ASSESSMENT:    1. Heart murmur, systolic   2. Right bundle branch block   3. OBSTRUCTIVE SLEEP APNEA   4. DYSPNEA   5. Dyslipidemia   6. Educated about COVID-19 virus infection   7. Heart murmur    PLAN:    In order of problems listed above:  1. The heart murmur is noticeable and she has no prior history of heart murmur that she has been informed of.  It is compatible with LV outflow and could possibly be related to systolic anterior motion of the mitral valve.  I do think I hear clicks.  This would raise the possibility of mitral valve prolapse.  The murmur could also simply be a LV outflow murmur.  2D Doppler echocardiogram will be performed. 2. The right bundle branch block is new and of uncertain significance.  With vertical axis and right bundle the question of pulmonary hypertension is raised.  The echo will help answer this question. 3. She uses an oral prosthesis. 4. She is not really having dyspnea but she does have exertional fatigue which could be a manifestation of diastolic dysfunction or systolic dysfunction.  A BNP will be obtained. 5. The most recent LDL was 110 in 2020.  No particular  management strategy.  We will base further management on work-up.  She did have coronary angiography in 2009 which did not demonstrate obstructive disease. 6. 3W's and vaccine are being complied with.   Cardio oncology may be at play.  We will see what the echo shows.  Will review the agents that she received.  She is also receiving Revlimid at this time.  Clinical follow-up will be dependent upon findings.  A troponin I and BNP will be obtained.   Medication Adjustments/Labs and Tests Ordered: Current medicines are reviewed at length with the patient today.  Concerns regarding medicines are outlined above.  Orders Placed This Encounter  Procedures  . EKG 12-Lead  . ECHOCARDIOGRAM COMPLETE   No orders of the defined types were placed in this encounter.   Patient Instructions  Medication Instructions:  Your physician recommends that you continue on your current medications as directed. Please refer to the Current Medication list given to you today.  *If you need a refill on your cardiac medications before your next appointment, please call your pharmacy*   Lab Work: None If you have labs (blood work) drawn today and your tests are completely normal, you will receive your results only by: Marland Kitchen MyChart Message (if you have MyChart) OR . A paper copy in the mail If you have any lab test that is abnormal or we need to change your treatment, we will call you to review the results.   Testing/Procedures: Your physician has requested that you have an echocardiogram. Echocardiography is a painless test that uses sound waves to create images  of your heart. It provides your doctor with information about the size and shape of your heart and how well your heart's chambers and valves are working. This procedure takes approximately one hour. There are no restrictions for this procedure.     Follow-Up: At [facility], you and your health needs are our priority.  As part of our continuing  mission to provide you with exceptional heart care, we have created designated Provider Care Teams.  These Care Teams include your primary Cardiologist (physician) and Advanced Practice Providers (APPs -  Physician Assistants and Nurse Practitioners) who all work together to provide you with the care you need, when you need it.  We recommend signing up for the patient portal called "MyChart".  Sign up information is provided on this After Visit Summary.  MyChart is used to connect with patients for Virtual Visits (Telemedicine).  Patients are able to view lab/test results, encounter notes, upcoming appointments, etc.  Non-urgent messages can be sent to your provider as well.   To learn more about what you can do with MyChart, go to [email or web address].    Your next appointment:   As needed  The format for your next appointment:   In Person  Provider:   You may see Dr. Daneen Schick or one of the following Advanced Practice Providers on your designated Care Team:    Truitt Merle, NP  Cecilie Kicks, NP  Kathyrn Drown, NP    Other Instructions      Signed, Sinclair Grooms, MD  11/26/2019 9:31 AM    [place]

## 2019-11-26 ENCOUNTER — Other Ambulatory Visit: Payer: Self-pay | Admitting: *Deleted

## 2019-11-26 ENCOUNTER — Other Ambulatory Visit: Payer: Self-pay

## 2019-11-26 ENCOUNTER — Ambulatory Visit (INDEPENDENT_AMBULATORY_CARE_PROVIDER_SITE_OTHER): Payer: Medicare Other | Admitting: Interventional Cardiology

## 2019-11-26 ENCOUNTER — Encounter: Payer: Self-pay | Admitting: Interventional Cardiology

## 2019-11-26 ENCOUNTER — Encounter: Payer: Self-pay | Admitting: *Deleted

## 2019-11-26 VITALS — BP 108/66 | HR 97 | Ht 67.0 in | Wt 139.2 lb

## 2019-11-26 DIAGNOSIS — C9001 Multiple myeloma in remission: Secondary | ICD-10-CM

## 2019-11-26 DIAGNOSIS — R0602 Shortness of breath: Secondary | ICD-10-CM | POA: Diagnosis not present

## 2019-11-26 DIAGNOSIS — E785 Hyperlipidemia, unspecified: Secondary | ICD-10-CM

## 2019-11-26 DIAGNOSIS — G4733 Obstructive sleep apnea (adult) (pediatric): Secondary | ICD-10-CM | POA: Diagnosis not present

## 2019-11-26 DIAGNOSIS — R011 Cardiac murmur, unspecified: Secondary | ICD-10-CM

## 2019-11-26 DIAGNOSIS — R9431 Abnormal electrocardiogram [ECG] [EKG]: Secondary | ICD-10-CM

## 2019-11-26 DIAGNOSIS — I4510 Unspecified right bundle-branch block: Secondary | ICD-10-CM | POA: Diagnosis not present

## 2019-11-26 DIAGNOSIS — Z7189 Other specified counseling: Secondary | ICD-10-CM

## 2019-11-26 MED ORDER — LENALIDOMIDE 10 MG PO CAPS: ORAL_CAPSULE | ORAL | 0 refills | Status: DC

## 2019-11-26 NOTE — Patient Instructions (Addendum)
Medication Instructions:  Your physician recommends that you continue on your current medications as directed. Please refer to the Current Medication list given to you today.  *If you need a refill on your cardiac medications before your next appointment, please call your pharmacy*   Lab Work: Pro BNP and Troponin I at time of echo  If you have labs (blood work) drawn today and your tests are completely normal, you will receive your results only by: Marland Kitchen MyChart Message (if you have MyChart) OR . A paper copy in the mail If you have any lab test that is abnormal or we need to change your treatment, we will call you to review the results.   Testing/Procedures: Your physician has requested that you have an echocardiogram. Echocardiography is a painless test that uses sound waves to create images of your heart. It provides your doctor with information about the size and shape of your heart and how well your heart's chambers and valves are working. This procedure takes approximately one hour. There are no restrictions for this procedure.     Follow-Up: At [facility], you and your health needs are our priority.  As part of our continuing mission to provide you with exceptional heart care, we have created designated Provider Care Teams.  These Care Teams include your primary Cardiologist (physician) and Advanced Practice Providers (APPs -  Physician Assistants and Nurse Practitioners) who all work together to provide you with the care you need, when you need it.  We recommend signing up for the patient portal called "MyChart".  Sign up information is provided on this After Visit Summary.  MyChart is used to connect with patients for Virtual Visits (Telemedicine).  Patients are able to view lab/test results, encounter notes, upcoming appointments, etc.  Non-urgent messages can be sent to your provider as well.   To learn more about what you can do with MyChart, go to [email or web address].     Your next appointment:   As needed  The format for your next appointment:   In Person  Provider:   You may see Dr. Daneen Schick or one of the following Advanced Practice Providers on your designated Care Team:    Truitt Merle, NP  Cecilie Kicks, NP  Kathyrn Drown, NP    Other Instructions

## 2019-11-26 NOTE — Telephone Encounter (Signed)
Received faxed refill request for Revlimid from [facility] Refilled per Dr. Grier Mitts OV note 11/10/19 Refill escribed to [place], [place] Auth# [id number], 11/26/19

## 2019-12-12 ENCOUNTER — Ambulatory Visit (HOSPITAL_COMMUNITY): Payer: Medicare Other | Attending: Internal Medicine

## 2019-12-12 ENCOUNTER — Other Ambulatory Visit: Payer: Self-pay | Admitting: *Deleted

## 2019-12-12 ENCOUNTER — Other Ambulatory Visit: Payer: Self-pay

## 2019-12-12 ENCOUNTER — Other Ambulatory Visit: Payer: Medicare Other | Admitting: *Deleted

## 2019-12-12 DIAGNOSIS — R9431 Abnormal electrocardiogram [ECG] [EKG]: Secondary | ICD-10-CM

## 2019-12-12 DIAGNOSIS — R011 Cardiac murmur, unspecified: Secondary | ICD-10-CM

## 2019-12-12 LAB — TROPONIN I (HIGH SENSITIVITY): Troponin I (High Sensitivity): 4 ng/L (ref <18)

## 2019-12-13 LAB — PRO B NATRIURETIC PEPTIDE: NT-Pro BNP: 123 pg/mL (ref 0–301)

## 2019-12-22 ENCOUNTER — Other Ambulatory Visit: Payer: Self-pay | Admitting: *Deleted

## 2019-12-22 DIAGNOSIS — C9001 Multiple myeloma in remission: Secondary | ICD-10-CM

## 2019-12-22 MED ORDER — LENALIDOMIDE 10 MG PO CAPS: ORAL_CAPSULE | ORAL | 0 refills | Status: DC

## 2019-12-22 NOTE — Telephone Encounter (Signed)
LM to patient to call. Refill request received from [employer]- a [employer] for Revlimid.  She has an appt with Dr Irene Limbo on 12/30/19. Want to know if she needs refill prior to appt. They had a discussion last office visit about whether to continue Revlimid or not.

## 2019-12-25 ENCOUNTER — Other Ambulatory Visit: Payer: Self-pay | Admitting: Obstetrics and Gynecology

## 2019-12-25 NOTE — Telephone Encounter (Signed)
Medication refill request: Estradiol Last AEX:  09/12/19 BS Next AEX: none scheduled Last MMG (if hormonal medication request): 07/14/19 BIRADS 1 negative/density b Refill authorized: Please advise on refill; Order pended for 42.5g w/0 refills if authorized

## 2019-12-30 ENCOUNTER — Inpatient Hospital Stay: Payer: Medicare Other | Attending: Hematology

## 2019-12-30 ENCOUNTER — Inpatient Hospital Stay (HOSPITAL_BASED_OUTPATIENT_CLINIC_OR_DEPARTMENT_OTHER): Payer: Medicare Other | Admitting: Hematology

## 2019-12-30 ENCOUNTER — Other Ambulatory Visit: Payer: Self-pay

## 2019-12-30 ENCOUNTER — Ambulatory Visit: Payer: BC Managed Care – PPO

## 2019-12-30 VITALS — BP 112/74 | HR 76 | Temp 98.2°F | Resp 20 | Ht 67.0 in | Wt 137.0 lb

## 2019-12-30 DIAGNOSIS — Z7982 Long term (current) use of aspirin: Secondary | ICD-10-CM | POA: Insufficient documentation

## 2019-12-30 DIAGNOSIS — E559 Vitamin D deficiency, unspecified: Secondary | ICD-10-CM

## 2019-12-30 DIAGNOSIS — Z9481 Bone marrow transplant status: Secondary | ICD-10-CM | POA: Diagnosis not present

## 2019-12-30 DIAGNOSIS — Z79899 Other long term (current) drug therapy: Secondary | ICD-10-CM | POA: Diagnosis not present

## 2019-12-30 DIAGNOSIS — C9001 Multiple myeloma in remission: Secondary | ICD-10-CM

## 2019-12-30 LAB — CBC WITH DIFFERENTIAL/PLATELET
Abs Immature Granulocytes: 0.01 10*3/uL (ref 0.00–0.07)
Basophils Absolute: 0.1 10*3/uL (ref 0.0–0.1)
Basophils Relative: 3 %
Eosinophils Absolute: 0.2 10*3/uL (ref 0.0–0.5)
Eosinophils Relative: 4 %
HCT: 40.1 % (ref 36.0–46.0)
Hemoglobin: 13 g/dL (ref 12.0–15.0)
Immature Granulocytes: 0 %
Lymphocytes Relative: 25 %
Lymphs Abs: 0.9 10*3/uL (ref 0.7–4.0)
MCH: 31.1 pg (ref 26.0–34.0)
MCHC: 32.4 g/dL (ref 30.0–36.0)
MCV: 95.9 fL (ref 80.0–100.0)
Monocytes Absolute: 0.6 10*3/uL (ref 0.1–1.0)
Monocytes Relative: 15 %
Neutro Abs: 2.1 10*3/uL (ref 1.7–7.7)
Neutrophils Relative %: 53 %
Platelets: 328 10*3/uL (ref 150–400)
RBC: 4.18 MIL/uL (ref 3.87–5.11)
RDW: 14.5 % (ref 11.5–15.5)
WBC: 3.8 10*3/uL — ABNORMAL LOW (ref 4.0–10.5)
nRBC: 0 % (ref 0.0–0.2)

## 2019-12-30 LAB — CMP (CANCER CENTER ONLY)
ALT: 40 U/L (ref 0–44)
AST: 26 U/L (ref 15–41)
Albumin: 4 g/dL (ref 3.5–5.0)
Alkaline Phosphatase: 84 U/L (ref 38–126)
Anion gap: 8 (ref 5–15)
BUN: 15 mg/dL (ref 8–23)
CO2: 27 mmol/L (ref 22–32)
Calcium: 9.9 mg/dL (ref 8.9–10.3)
Chloride: 105 mmol/L (ref 98–111)
Creatinine: 0.83 mg/dL (ref 0.44–1.00)
GFR, Est AFR Am: >60 mL/min (ref >60)
GFR, Estimated: >60 mL/min (ref >60)
Glucose, Bld: 94 mg/dL (ref 70–99)
Potassium: 3.8 mmol/L (ref 3.5–5.1)
Sodium: 140 mmol/L (ref 135–145)
Total Bilirubin: 1.2 mg/dL (ref 0.3–1.2)
Total Protein: 8 g/dL (ref 6.5–8.1)

## 2019-12-30 NOTE — Progress Notes (Signed)
HEMATOLOGY/ONCOLOGY CLINIC NOTE  Date of Service: 12/30/19  PCP: .[name], MD  CC:  F/u for continue mx of multiple myeloma  CHIEF COMPLAINTS/PURPOSE OF CONSULTATION:  Follow up for multiple myeloma  DIAGNOSIS  IgA lambda R-ISS stage II multiple myeloma with innumerable lytic lesions in the calvarium, lesion in T7 vertebra and multiple lesions in the pelvis.  No significant bone pain at this time. Diagnosed in January 2017.  Current treatment  Revlimid 71m po daily 3 weeks on 1 week off (after treatment interruption)   Previous Treatment  Status post Vd x 1 cycle VRd x 6 cycles  HD Melphalan 2028mm2 on 03/22/2016 Autologous HSCT on 03/23/2016 [facility]10^6/kg)  Current rx -Revlimid maintenance     INTERVAL HISTORY:   Ms CoHayworths here for management and evaluation of her multiple myeloma. The patient's last visit with [place] on 11/10/2019. The pt reports that she is doing well overall.  The pt reports that she has been well in the interim and has had no new concerns. She denies any new symptoms or issues with Revlimid. Pt is currently taking 5000 IU of Vitamin D per day. Pt has received both doses of the COVID19 vaccine and tolerated them well.   Lab results today (12/30/19) of CBC w/diff and CMP is as follows: all values are WNL except for WBC at 3.8K. 12/30/2019 MMP - no M spike  On review of systems, pt denies leg swelling, bone pain, skin rashes, abdominal pain and any other symptoms.    REVIEW OF SYSTEMS:   A 10+ POINT REVIEW OF SYSTEMS WAS OBTAINED including neurology, dermatology, psychiatry, cardiac, respiratory, lymph, extremities, GI, GU, Musculoskeletal, constitutional, breasts, reproductive, HEENT.  All pertinent positives are noted in the HPI.  All others are negative.   PHYSICAL EXAMINATION: NAD .BP 112/74 (BP Location: Left Arm, Patient Position: Sitting)   Pulse 76   Temp 98.2 F (36.8 C)  (Temporal)   Resp 20   Ht 5' 7"  (1.702 m)   Wt 137 lb (62.1 kg)   LMP 09/26/1995 (Within Months)   SpO2 100%   BMI 21.46 kg/m   GENERAL:alert, in no acute distress and comfortable SKIN: no acute rashes, no significant lesions EYES: conjunctiva are pink and non-injected, sclera anicteric OROPHARYNX: MMM, no exudates, no oropharyngeal erythema or ulceration NECK: supple, no JVD LYMPH:  no palpable lymphadenopathy in the cervical, axillary or inguinal regions LUNGS: clear to auscultation b/l with normal respiratory effort HEART: regular rate & rhythm ABDOMEN:  normoactive bowel sounds , non tender, not distended. No palpable hepatosplenomegaly.  Extremity: no pedal edema PSYCH: alert & oriented x 3 with fluent speech NEURO: no focal motor/sensory deficits  MEDICAL HISTORY:   Past Medical History:  Diagnosis Date  . Abdominal pain   . Abnormal Pap smear of vagina 03/22/2017   ASCUS pap and negative HR HPV.  Patient is on immunosuppressive medication.   . Anemia   . AR (aortic regurgitation) 11/30/2017   trace noted on ECHO  . Atrial septal aneurysm per 11-30-17 echo   trivial pericardial effusion  . Bunion 05/13/2018  . [place]   squamous cell carcinoma of thymus  . Counseling regarding advanced care planning and goals of care 08/03/2017  . Cystocele, unspecified (CODE) 03/06/2018  . DDD (degenerative disc disease), cervical   . DDD (degenerative disc disease), lumbosacral   . DYSPNEA 12/04/2007   Qualifier: Diagnosis of  By: ClGwenette GreetD, KeArmando Reichert .  Facet degeneration of lumbar region   . Facet joint disease of cervical region   . Fatigue   . Fatty liver   . Fibroid    reason for Hysterectomy  . FTT (failure to thrive) in adult 08/09/2017  . Gastroesophageal reflux disease without esophagitis 01/17/2017  . GERD (gastroesophageal reflux disease)   . Grade I diastolic dysfunction   . Hematuria 11/08/2015  . History of cystocele   . History of stress incontinence   .  Hypergammaglobulinemia   . Hyperlipidemia   . HYPERLIPIDEMIA 12/04/2007   Qualifier: Diagnosis of  By: Gwenette Greet MD, Armando Reichert   . Hypoxemia 12/16/2007   Qualifier: Diagnosis of  By: Gwenette Greet MD, Armando Reichert   . Lung nodule   . MALIGNANT NEOPLASM OF THYMUS 12/03/2007   Annotation: resection Qualifier: History of  By: Doy Mince LPN, Megan    . Metastatic multiple myeloma to bone ([place]) 10/19/2015  . Migraine    episode of aphasia worked up with echo by [facility] 11-23-17, has visual migraines  . MR (mitral regurgitation) 11/30/2017   trace noted on ECHO  . Multiple myeloma ([place]) 08/2015   metastatic to bone  . Muscular fasciculation   . Nasal septal deviation   . Nasal turbinate hypertrophy   . Near syncope   . OBSTRUCTIVE SLEEP APNEA 12/03/2007   Qualifier: Diagnosis of  By: Doy Mince LPN, Megan    . Other fatigue   . Peripheral edema   . Plasma cell dyscrasia   . PONV (postoperative nausea and vomiting)    thinks morphine caused nausea  . Postoperative urinary retention 05/14/2018  . Rash 11/01/2015  . Restless leg syndrome   . S/P autologous bone marrow transplantation ([place]) 03/29/2016  . Sleep apnea    uses old oral allialnce for osa due to cpap intolerance  . Status post surgery 05/14/2018  . TR (tricuspid regurgitation) 11/30/2017   trace noted on ECHO  . Urinary incontinence   . Urinary tract infection without hematuria   . UTI (urinary tract infection)   . Vaginal granulation tissue 12/03/2017  . Wears glasses     SURGICAL HISTORY: Past Surgical History:  Procedure Laterality Date  . ABDOMINAL HYSTERECTOMY  1997   TAH--ovaries remain--Dr. Newton Pigg  . ANTERIOR AND POSTERIOR REPAIR N/A 05/14/2018   Procedure: ANTERIOR (CYSTOCELE) AND POSTERIOR REPAIR (RECTOCELE);  Surgeon: [name], MD;  Location: [facility];  Service: Gynecology;  Laterality: N/A;  . BLADDER SUSPENSION N/A 05/14/2018   Procedure: TRANSVAGINAL TAPE (TVT) PROCEDURE exact midurethral sling;   Surgeon: [name], MD;  Location: [facility];  Service: Gynecology;  Laterality: N/A;  . BONE MARROW TRANSPLANT  2017   done at [place]  . BREAST BIOPSY    . COLONOSCOPY    . CYSTOSCOPY N/A 05/14/2018   Procedure: CYSTOSCOPY;  Surgeon: [name], MD;  Location: [facility];  Service: Gynecology;  Laterality: N/A;  . CYSTOSCOPY N/A 05/30/2018   Procedure: CYSTOSCOPY;  Surgeon: [name], MD;  Location: [facility];  Service: Gynecology;  Laterality: N/A;  . ROBOTIC ASSISTED LAPAROSCOPIC SACROCOLPOPEXY N/A 05/14/2018   Procedure: XI ROBOTIC ASSISTED LAPAROSCOPIC SACROCOLPOPEXY;  Surgeon: [name], MD;  Location: [facility];  Service: Gynecology;  Laterality: N/A;  6 hours OR time. Need extended stay recovery bed.  Marland Kitchen SALPINGOOPHORECTOMY Bilateral 05/14/2018   Procedure: SALPINGO OOPHORECTOMY;  Surgeon: [name], MD;  Location: [facility];  Service: Gynecology;  Laterality: Bilateral;  . THYMECTOMY  1999  . TRANSVAGINAL TAPE (TVT) REMOVAL N/A 05/30/2018   Procedure: TRANSVAGINAL TAPE (TVT)  Revision;  Surgeon: [name], MD;  Location: [facility];  Service: Gynecology;  Laterality: N/A;  . WISDOM TOOTH EXTRACTION      SOCIAL HISTORY: Social History   Socioeconomic History  . Marital status: Married    Spouse name: [name]  . Number of children: 2  . Years of education: college  . 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 Smoking status: Never Smoker  . Smokeless tobacco: Never Used  Substance and Sexual Activity  . Alcohol use: Yes    Alcohol/week: 0.0 standard drinks    Comment: once a year drink  . Drug use: Never  . Sexual activity: Not Currently    Partners: Male    Birth control/protection: Surgical    Comment: TAH--ovaries remain  Other Topics Concern  . Not on file  Social History Narrative    Lives with husband   Caffeine- coffee, 2 cups daily   Social Determinants of Health   Financial Resource Strain:   . Difficulty of Paying Living Expenses:   Food Insecurity:   . Worried About Charity fundraiser in the Last Year:   . Arboriculturist in the Last Year:   Transportation Needs:   . Film/video editor (Medical):   Marland Kitchen Lack of Transportation (Non-Medical):   Physical Activity:   . Days of Exercise per Week:   . Minutes of Exercise per Session:   Stress:   . Feeling of Stress :   Social Connections:   . Frequency of Communication with Friends and Family:   . Frequency of Social Gatherings with Friends and Family:   . Attends Religious Services:   . Active Member of Clubs or Organizations:   . Attends Archivist Meetings:   Marland Kitchen Marital Status:   Intimate Partner Violence:   . Fear of Current or Ex-Partner:   . Emotionally Abused:   Marland Kitchen Physically Abused:   . Sexually Abused:     FAMILY HISTORY: Family History  Problem Relation Age of Onset  . COPD Father        dec age 32/s-smoked  . Macular degeneration Father   . Diabetes Brother   . Other Brother        committed suicide age 12  . Macular degeneration Brother   . Hyperlipidemia Mother   . Diabetes Maternal Grandmother   . Heart attack Paternal Grandfather     ALLERGIES:  is allergic to ampicillin and penicillins.  MEDICATIONS:  . Current Outpatient Medications:  .  aspirin EC 81 MG tablet, Take 81 mg by mouth daily., Disp: , Rfl:  .  b complex vitamins capsule, Take 1 capsule by mouth daily., Disp: , Rfl:  .  Calcium 250 MG CAPS, Take 1,000 mg by mouth daily., Disp: , Rfl:  .  Cholecalciferol (VITAMIN D3) 30 MCG/15ML LIQD, Take by mouth., Disp: , Rfl:  .  estradiol (ESTRACE) 0.1 MG/GM vaginal cream, INSERT 1/2 GRAM INTO THE VAGINA AT BEDTIME TWICE WEEKLY, Disp: 43 g, Rfl: 0 .  lenalidomide (REVLIMID) 10 MG capsule, TAKE ONE CAPSULE BY MOUTH DAILY FOR 21 DAYS, THEN 7 DAYS OFF, Disp: 21  capsule, Rfl: 0 .  Magnesium 400 MG CAPS, Take by mouth., Disp: , Rfl:  LABORATORY DATA:  I have reviewed the data as listed  . CBC Latest Ref Rng & Units 11/10/2019 09/15/2019 07/15/2019  WBC 4.0 - 10.5 K/uL 5.9 4.4 5.5  Hemoglobin 12.0 - 15.0 g/dL 13.3 12.3 12.8  Hematocrit 36.0 - 46.0 % 40.7 37.8 38.5  Platelets 150 - 400 K/uL 299 300 321    . CMP Latest Ref Rng & Units 11/10/2019 09/15/2019 07/15/2019  Glucose 70 - 99 mg/dL 92 119(H) 97  BUN 8 - 23 mg/dL 17 15 17   Creatinine 0.44 - 1.00 mg/dL 1.17(H) 0.96 0.76  Sodium 135 - 145 mmol/L 140 141 140  Potassium 3.5 - 5.1 mmol/L 4.0 4.0 4.0  Chloride 98 - 111 mmol/L 102 105 106  CO2 22 - 32 mmol/L 28 25 23   Calcium 8.9 - 10.3 mg/dL 9.3 9.1 9.4  Total Protein 6.5 - 8.1 g/dL 8.0 7.4 7.6  Total Bilirubin 0.3 - 1.2 mg/dL 0.8 0.6 0.9  Alkaline Phos 38 - 126 U/L 78 73 77  AST 15 - 41 U/L 21 24 20   ALT 0 - 44 U/L 28 28 27    10/10/2019 Flow Cytometry (Repeat BM Bx from [facility]):   10/07/18 Repeat BM Bx from [facility]:    RADIOGRAPHIC STUDIES: I have personally reviewed the radiological images as listed and agreed with the findings in the report. ECHOCARDIOGRAM COMPLETE  Result Date: 12/12/2019    ECHOCARDIOGRAM REPORT   Patient Name:   MAYUKHA SYMMONDS Date of Exam: 12/12/2019 Medical Rec #:  [id number]      Height:       67.0 in Accession #:    [id number]     Weight:       139.2 lb Date of Birth:  1956-08-29      BSA:          1.734 m Patient Age:    64 years       BP:           105/73 mmHg Patient Gender: F              HR:           64 bpm. Exam Location:  [facility] Procedure: 2D Echo, Cardiac Doppler and Color Doppler Indications:    R01.1 Murmur  History:        Patient has prior history of Echocardiogram examinations, most                 recent 11/30/2017. Arrythmias:RBBB; Risk Factors:Dyslipidemia.                 Obstructive sleep apnea.  Sonographer:    Diamond Nickel RCS Referring Phys: [place]  1. No  significant change from echo report of 2019.  2. Left ventricular ejection fraction, by estimation, is 55 to 60%. The left ventricle has normal function. The left ventricle has no regional wall motion abnormalities. Left ventricular diastolic parameters are consistent with Grade I diastolic dysfunction (impaired relaxation).  3. Right ventricular systolic function is normal. The right ventricular size is normal. There is normal pulmonary artery systolic pressure.  4. The mitral valve is abnormal. Trivial mitral valve regurgitation.  5. The aortic valve is abnormal. Aortic valve regurgitation is mild. Mild aortic valve sclerosis is present, with no evidence of aortic valve stenosis.  6. The inferior vena cava is normal in size with greater than 50% respiratory variability, suggesting right atrial pressure of 3 mmHg. FINDINGS  Left Ventricle: Left ventricular ejection fraction, by  estimation, is 55 to 60%. The left ventricle has normal function. The left ventricle has no regional wall motion abnormalities. The left ventricular internal cavity size was normal in size. There is  no left ventricular hypertrophy. Left ventricular diastolic parameters are consistent with Grade I diastolic dysfunction (impaired relaxation). Right Ventricle: The right ventricular size is normal. No increase in right ventricular wall thickness. Right ventricular systolic function is normal. There is normal pulmonary artery systolic pressure. The tricuspid regurgitant velocity is 2.19 m/s, and  with an assumed right atrial pressure of 10 mmHg, the estimated right ventricular systolic pressure is 89.3 mmHg. Left Atrium: Left atrial size was normal in size. Right Atrium: Right atrial size was normal in size. Pericardium: A small pericardial effusion is present. Mitral Valve: The mitral valve is abnormal. There is mild thickening of the mitral valve leaflet(s). Mild mitral annular calcification. Trivial mitral valve regurgitation. Tricuspid  Valve: The tricuspid valve is normal in structure. Tricuspid valve regurgitation is trivial. Aortic Valve: The aortic valve is abnormal. Aortic valve regurgitation is mild. Aortic regurgitation PHT measures 1672 msec. Mild aortic valve sclerosis is present, with no evidence of aortic valve stenosis. Pulmonic Valve: The pulmonic valve was normal in structure. Pulmonic valve regurgitation is not visualized. Aorta: The aortic root is normal in size and structure. Venous: The inferior vena cava is normal in size with greater than 50% respiratory variability, suggesting right atrial pressure of 3 mmHg. IAS/Shunts: The interatrial septum is aneurysmal. The interatrial septum was not assessed.  LEFT VENTRICLE PLAX 2D LVIDd:         3.19 cm  Diastology LVIDs:         2.37 cm  LV e' lateral:   8.92 cm/s LV PW:         0.95 cm  LV E/e' lateral: 8.1 LV IVS:        1.05 cm  LV e' medial:    6.74 cm/s LVOT diam:     1.80 cm  LV E/e' medial:  10.7 LV SV:         52 LV SV Index:   30 LVOT Area:     2.54 cm  RIGHT VENTRICLE RV Basal diam:  2.37 cm RV S prime:     8.05 cm/s TAPSE (M-mode): 0.8 cm RVSP:           22.2 mmHg LEFT ATRIUM             Index       RIGHT ATRIUM           Index LA diam:        3.10 cm 1.79 cm/m  RA Pressure: 3.00 mmHg LA Vol (A2C):   26.0 ml 15.00 ml/m RA Area:     7.84 cm LA Vol (A4C):   27.8 ml 16.04 ml/m RA Volume:   13.40 ml  7.73 ml/m LA Biplane Vol: 28.1 ml 16.21 ml/m  AORTIC VALVE LVOT Vmax:   97.50 cm/s LVOT Vmean:  57.100 cm/s LVOT VTI:    0.203 m AI PHT:      1672 msec  AORTA Ao Root diam: 3.00 cm MITRAL VALVE               TRICUSPID VALVE MV Area (PHT): 3.65 cm    TR Peak grad:   19.2 mmHg MV Decel Time: 208 msec    TR Vmax:        219.00 cm/s MV E velocity: 72.30 cm/s  Estimated RAP:  3.00  mmHg MV A velocity: 78.10 cm/s  RVSP:           22.2 mmHg MV E/A ratio:  0.93                            SHUNTS                            Systemic VTI:  0.20 m                            Systemic Diam:  1.80 cm Dorris Carnes MD Electronically signed by Dorris Carnes MD Signature Date/Time: 12/12/2019/8:36:19 PM    Final     ASSESSMENT & PLAN:   64 y.o. caucasian female with  1. H/o Multiple myeloma - currently in remission.  IgA lambda R-ISS stage II multiple myeloma with innumerable lytic lesions in the calvarium, lesion in T7 vertebra and multiple lesions in the pelvis.  No significant bone pain at this time. Diagnosed in January 2017. s/p treatment as noted above 10/07/18 BM Bx and Flow cytometry indicate that the pt continues to be in CR 10/10/2019 Flow Cytometry revealed "A. Bone Marrow (Plasma Cell Myeloma Minimal Residual Disease Detection by Flow Cytometry): Negative. No phenotypically abnormal plasma cells at or above the limit of detection identified."  2.Pre-op assessment for myeloma Held Revlimid from 04/30/18 through her 05/14/18 uterine lift procedure and the bunionectomy on 06/25/18 to decrease risk of clots  PLAN:  -Discussed pt labwork today, 12/30/19; blood counts look great, blood chemistries are normal  -Discussed 12/30/2019 MMP is in progress, 11/10/2019 M Protein is "Not Observed" -Discussed 11/10/2019 Vitamin D 25 hydroxy at 41.58 -Pt's Multiple Myeloma continues to be in remission  -Will continue Revlimid for maintenance at this time - pt agrees  -The pt has no prohibitive toxicities from continuing maintenance Revlimid at this time -Continue ASA for VTE prophylaxis -Will see back in 2 months with labs    FOLLOW UP:  RTC with Dr Irene Limbo with labs in 2 months   The total time spent in the appt was 20 minutes and more than 50% was on counseling and direct patient cares.  All of the patient's questions were answered with apparent satisfaction. The patient knows to call the clinic with any problems, questions or concerns.   Sullivan Lone MD [place] AAHIVMS [facility] Hematology/Oncology Physician [facility]  (Office):       [phone] (Work cell):   [phone] (Fax):           [phone]   I, Yevette Edwards, am acting as a scribe for Dr. Sullivan Lone.   .I have reviewed the above documentation for accuracy and completeness, and I agree with the above. Brunetta Genera MD

## 2020-01-02 LAB — MULTIPLE MYELOMA PANEL, SERUM
Albumin SerPl Elph-Mcnc: 3.8 g/dL (ref 2.9–4.4)
Albumin/Glob SerPl: 1.1 (ref 0.7–1.7)
Alpha 1: 0.3 g/dL (ref 0.0–0.4)
Alpha2 Glob SerPl Elph-Mcnc: 0.7 g/dL (ref 0.4–1.0)
B-Globulin SerPl Elph-Mcnc: 1.2 g/dL (ref 0.7–1.3)
Gamma Glob SerPl Elph-Mcnc: 1.3 g/dL (ref 0.4–1.8)
Globulin, Total: 3.5 g/dL (ref 2.2–3.9)
IgA: 455 mg/dL — ABNORMAL HIGH (ref 87–352)
IgG (Immunoglobin G), Serum: 1235 mg/dL (ref 586–1602)
IgM (Immunoglobulin M), Srm: 52 mg/dL (ref 26–217)
Total Protein ELP: 7.3 g/dL (ref 6.0–8.5)

## 2020-01-06 ENCOUNTER — Telehealth: Payer: Self-pay | Admitting: Hematology

## 2020-01-06 NOTE — Telephone Encounter (Signed)
Scheduled per 04/06 los, patient has been called and voicemail was left. 

## 2020-01-08 ENCOUNTER — Other Ambulatory Visit: Payer: BLUE CROSS/BLUE SHIELD

## 2020-01-08 ENCOUNTER — Ambulatory Visit: Payer: BLUE CROSS/BLUE SHIELD | Admitting: Hematology

## 2020-01-16 ENCOUNTER — Other Ambulatory Visit: Payer: Self-pay | Admitting: *Deleted

## 2020-01-16 DIAGNOSIS — C9001 Multiple myeloma in remission: Secondary | ICD-10-CM

## 2020-01-16 MED ORDER — LENALIDOMIDE 10 MG PO CAPS: ORAL_CAPSULE | ORAL | 0 refills | Status: DC

## 2020-01-16 NOTE — Telephone Encounter (Signed)
Refill Revlimid per Dr. Grier Mitts OV note 4/6 Escribed to [employer], [place], [place] Auth# [id number], 01/16/20

## 2020-01-19 ENCOUNTER — Telehealth: Payer: Self-pay

## 2020-01-19 NOTE — Telephone Encounter (Signed)
Oral Oncology Patient Advocate Encounter   Was successful in securing patient a $[id number] grant from [employer] (PAF) to provide copayment coverage for Revlimid.  This will keep the out of pocket expense at $0.     I have spoken with the patient.    The billing information is as follows and has been shared with [place]: [id number] PCN:  [id number] Member ID: [id number]  Group ID: [id number] Dates of Eligibility: 01/19/20 through 01/18/21  [place] [facility] [place] Phone [phone] Fax [phone] 01/19/2020 11:13 AM

## 2020-02-16 ENCOUNTER — Other Ambulatory Visit: Payer: Self-pay | Admitting: *Deleted

## 2020-02-16 DIAGNOSIS — C9001 Multiple myeloma in remission: Secondary | ICD-10-CM

## 2020-02-16 MED ORDER — LENALIDOMIDE 10 MG PO CAPS: ORAL_CAPSULE | ORAL | 0 refills | Status: DC

## 2020-02-16 NOTE — Telephone Encounter (Signed)
Pharmacy called and faxed request received from [facility] requesting refill of patient's Revlimid. Revlimid refilled per Dr.Kale OV noted 12/30/19 escribed to [facility] Auth# [id number], 02/16/20

## 2020-03-01 ENCOUNTER — Inpatient Hospital Stay (HOSPITAL_BASED_OUTPATIENT_CLINIC_OR_DEPARTMENT_OTHER): Payer: Medicare Other | Admitting: Hematology

## 2020-03-01 ENCOUNTER — Other Ambulatory Visit: Payer: Self-pay

## 2020-03-01 ENCOUNTER — Inpatient Hospital Stay: Payer: Medicare Other | Attending: Hematology

## 2020-03-01 VITALS — BP 116/65 | HR 80 | Temp 97.9°F | Resp 18 | Ht 67.0 in | Wt 134.8 lb

## 2020-03-01 DIAGNOSIS — Z83518 Family history of other specified eye disorder: Secondary | ICD-10-CM | POA: Insufficient documentation

## 2020-03-01 DIAGNOSIS — Z79899 Other long term (current) drug therapy: Secondary | ICD-10-CM | POA: Diagnosis not present

## 2020-03-01 DIAGNOSIS — Z833 Family history of diabetes mellitus: Secondary | ICD-10-CM | POA: Insufficient documentation

## 2020-03-01 DIAGNOSIS — C7951 Secondary malignant neoplasm of bone: Secondary | ICD-10-CM | POA: Insufficient documentation

## 2020-03-01 DIAGNOSIS — Z8249 Family history of ischemic heart disease and other diseases of the circulatory system: Secondary | ICD-10-CM | POA: Diagnosis not present

## 2020-03-01 DIAGNOSIS — C9001 Multiple myeloma in remission: Secondary | ICD-10-CM | POA: Diagnosis present

## 2020-03-01 DIAGNOSIS — E559 Vitamin D deficiency, unspecified: Secondary | ICD-10-CM

## 2020-03-01 DIAGNOSIS — R252 Cramp and spasm: Secondary | ICD-10-CM | POA: Insufficient documentation

## 2020-03-01 DIAGNOSIS — E785 Hyperlipidemia, unspecified: Secondary | ICD-10-CM | POA: Insufficient documentation

## 2020-03-01 DIAGNOSIS — Z83438 Family history of other disorder of lipoprotein metabolism and other lipidemia: Secondary | ICD-10-CM | POA: Diagnosis not present

## 2020-03-01 DIAGNOSIS — Z836 Family history of other diseases of the respiratory system: Secondary | ICD-10-CM | POA: Insufficient documentation

## 2020-03-01 DIAGNOSIS — G4730 Sleep apnea, unspecified: Secondary | ICD-10-CM | POA: Insufficient documentation

## 2020-03-01 LAB — CMP (CANCER CENTER ONLY)
ALT: 28 U/L (ref 0–44)
AST: 19 U/L (ref 15–41)
Albumin: 3.9 g/dL (ref 3.5–5.0)
Alkaline Phosphatase: 80 U/L (ref 38–126)
Anion gap: 11 (ref 5–15)
BUN: 17 mg/dL (ref 8–23)
CO2: 24 mmol/L (ref 22–32)
Calcium: 10 mg/dL (ref 8.9–10.3)
Chloride: 105 mmol/L (ref 98–111)
Creatinine: 0.92 mg/dL (ref 0.44–1.00)
GFR, Est AFR Am: >60 mL/min (ref >60)
GFR, Estimated: >60 mL/min (ref >60)
Glucose, Bld: 84 mg/dL (ref 70–99)
Potassium: 4.2 mmol/L (ref 3.5–5.1)
Sodium: 140 mmol/L (ref 135–145)
Total Bilirubin: 1.3 mg/dL — ABNORMAL HIGH (ref 0.3–1.2)
Total Protein: 7.8 g/dL (ref 6.5–8.1)

## 2020-03-01 LAB — CBC WITH DIFFERENTIAL/PLATELET
Abs Immature Granulocytes: 0.01 10*3/uL (ref 0.00–0.07)
Basophils Absolute: 0 10*3/uL (ref 0.0–0.1)
Basophils Relative: 1 %
Eosinophils Absolute: 0.2 10*3/uL (ref 0.0–0.5)
Eosinophils Relative: 5 %
HCT: 40.9 % (ref 36.0–46.0)
Hemoglobin: 13.6 g/dL (ref 12.0–15.0)
Immature Granulocytes: 0 %
Lymphocytes Relative: 18 %
Lymphs Abs: 0.6 10*3/uL — ABNORMAL LOW (ref 0.7–4.0)
MCH: 31.8 pg (ref 26.0–34.0)
MCHC: 33.3 g/dL (ref 30.0–36.0)
MCV: 95.6 fL (ref 80.0–100.0)
Monocytes Absolute: 0.3 10*3/uL (ref 0.1–1.0)
Monocytes Relative: 9 %
Neutro Abs: 2.2 10*3/uL (ref 1.7–7.7)
Neutrophils Relative %: 67 %
Platelets: 309 10*3/uL (ref 150–400)
RBC: 4.28 MIL/uL (ref 3.87–5.11)
RDW: 14.7 % (ref 11.5–15.5)
WBC: 3.3 10*3/uL — ABNORMAL LOW (ref 4.0–10.5)
nRBC: 0 % (ref 0.0–0.2)

## 2020-03-01 LAB — VITAMIN D 25 HYDROXY (VIT D DEFICIENCY, FRACTURES): Vit D, 25-Hydroxy: 40.59 ng/mL (ref 30–100)

## 2020-03-01 NOTE — Progress Notes (Signed)
HEMATOLOGY/ONCOLOGY CLINIC NOTE  Date of Service: 03/01/20  PCP: .[name], MD  CC:  F/u for continue mx of multiple myeloma  CHIEF COMPLAINTS/PURPOSE OF CONSULTATION:  Follow up for multiple myeloma  DIAGNOSIS  IgA lambda R-ISS stage II multiple myeloma with innumerable lytic lesions in the calvarium, lesion in T7 vertebra and multiple lesions in the pelvis.  No significant bone pain at this time. Diagnosed in January 2017.  Current treatment  Revlimid 12m po daily 3 weeks on 1 week off (after treatment interruption)   Previous Treatment  Status post Vd x 1 cycle VRd x 6 cycles  HD Melphalan 2047mm2 on 03/22/2016 Autologous HSCT on 03/23/2016 with Dr [facility] 6.4 x10^6/kg)  Current rx -Revlimid maintenance     INTERVAL HISTORY:   Ms CoPorcos here for management and evaluation of her multiple myeloma. The patient's last visit with [place] on 12/30/2019. The pt reports that she is doing well overall.  The pt reports that she has had no issues taking Revlimid. Pt has been feeling stable and is able to do all of the activities that she would like to do. She has continued taking 5000 IU Vitamin D Daily.    Lab results today (03/01/20) of CBC w/diff and CMP is as follows: all values are WNL except for WBC at 3.3K, Lymphs Abs at 0.6K, Total Bilirubin at 1.3. 03/01/2020 MMP is in progress 03/01/2020 Vitamin D 25 (OH) is in progress  On review of systems, pt reports muscle cramps and denies rashes, diarrhea, new bone pain, fatigue and any other symptoms.    REVIEW OF SYSTEMS:   A 10+ POINT REVIEW OF SYSTEMS WAS OBTAINED including neurology, dermatology, psychiatry, cardiac, respiratory, lymph, extremities, GI, GU, Musculoskeletal, constitutional, breasts, reproductive, HEENT.  All pertinent positives are noted in the HPI.  All others are negative.   PHYSICAL EXAMINATION: NAD .BP 116/65 (BP Location: Left Arm, Patient Position: Sitting)    Pulse 80   Temp 97.9 F (36.6 C) (Temporal)   Resp 18   Ht 5' 7"  (1.702 m)   Wt 134 lb 12.8 oz (61.1 kg)   LMP 09/26/1995 (Within Months)   SpO2 98%   BMI 21.11 kg/m    GENERAL:alert, in no acute distress and comfortable SKIN: no acute rashes, no significant lesions EYES: conjunctiva are pink and non-injected, sclera anicteric OROPHARYNX: MMM, no exudates, no oropharyngeal erythema or ulceration NECK: supple, no JVD LYMPH:  no palpable lymphadenopathy in the cervical, axillary or inguinal regions LUNGS: clear to auscultation b/l with normal respiratory effort HEART: regular rate & rhythm ABDOMEN:  normoactive bowel sounds , non tender, not distended. No palpable hepatosplenomegaly.  Extremity: no pedal edema PSYCH: alert & oriented x 3 with fluent speech NEURO: no focal motor/sensory deficits  MEDICAL HISTORY:   Past Medical History:  Diagnosis Date  . Abdominal pain   . Abnormal Pap smear of vagina 03/22/2017   ASCUS pap and negative HR HPV.  Patient is on immunosuppressive medication.   . Anemia   . AR (aortic regurgitation) 11/30/2017   trace noted on ECHO  . Atrial septal aneurysm per 11-30-17 echo   trivial pericardial effusion  . Bunion 05/13/2018  . Cancer (HCFrohna1999   squamous cell carcinoma of thymus  . Counseling regarding advanced care planning and goals of care 08/03/2017  . Cystocele, unspecified (CODE) 03/06/2018  . DDD (degenerative disc disease), cervical   . DDD (degenerative disc disease), lumbosacral   . DYSPNEA 12/04/2007  Qualifier: Diagnosis of  By: Gwenette Greet MD, Armando Reichert   . Facet degeneration of lumbar region   . Facet joint disease of cervical region   . Fatigue   . Fatty liver   . Fibroid    reason for Hysterectomy  . FTT (failure to thrive) in adult 08/09/2017  . Gastroesophageal reflux disease without esophagitis 01/17/2017  . GERD (gastroesophageal reflux disease)   . Grade I diastolic dysfunction   . Hematuria 11/08/2015  . History of  cystocele   . History of stress incontinence   . Hypergammaglobulinemia   . Hyperlipidemia   . HYPERLIPIDEMIA 12/04/2007   Qualifier: Diagnosis of  By: Gwenette Greet MD, Armando Reichert   . Hypoxemia 12/16/2007   Qualifier: Diagnosis of  By: Gwenette Greet MD, Armando Reichert   . Lung nodule   . MALIGNANT NEOPLASM OF THYMUS 12/03/2007   Annotation: resection Qualifier: History of  By: Doy Mince LPN, Megan    . Metastatic multiple myeloma to bone ([place]) 10/19/2015  . Migraine    episode of aphasia worked up with echo by [place] [facility]ology 11-23-17, has visual migraines  . MR (mitral regurgitation) 11/30/2017   trace noted on ECHO  . Multiple myeloma ([place]) 08/2015   metastatic to bone  . Muscular fasciculation   . Nasal septal deviation   . Nasal turbinate hypertrophy   . Near syncope   . OBSTRUCTIVE SLEEP APNEA 12/03/2007   Qualifier: Diagnosis of  By: Doy Mince LPN, Megan    . Other fatigue   . Peripheral edema   . Plasma cell dyscrasia   . PONV (postoperative nausea and vomiting)    thinks morphine caused nausea  . Postoperative urinary retention 05/14/2018  . Rash 11/01/2015  . Restless leg syndrome   . S/P autologous bone marrow transplantation ([place]) 03/29/2016  . Sleep apnea    uses old oral allialnce for osa due to cpap intolerance  . Status post surgery 05/14/2018  . TR (tricuspid regurgitation) 11/30/2017   trace noted on ECHO  . Urinary incontinence   . Urinary tract infection without hematuria   . UTI (urinary tract infection)   . Vaginal granulation tissue 12/03/2017  . Wears glasses     SURGICAL HISTORY: Past Surgical History:  Procedure Laterality Date  . ABDOMINAL HYSTERECTOMY  1997   TAH--ovaries remain--Dr. Newton Pigg  . ANTERIOR AND POSTERIOR REPAIR N/A 05/14/2018   Procedure: ANTERIOR (CYSTOCELE) AND POSTERIOR REPAIR (RECTOCELE);  Surgeon: [name], MD;  Location: [facility];  Service: Gynecology;  Laterality: N/A;  . BLADDER SUSPENSION N/A 05/14/2018   Procedure: TRANSVAGINAL  TAPE (TVT) PROCEDURE exact midurethral sling;  Surgeon: [name], MD;  Location: [facility];  Service: Gynecology;  Laterality: N/A;  . BONE MARROW TRANSPLANT  2017   done at [facility]  . BREAST BIOPSY    . COLONOSCOPY    . CYSTOSCOPY N/A 05/14/2018   Procedure: CYSTOSCOPY;  Surgeon: [name], MD;  Location: [facility];  Service: Gynecology;  Laterality: N/A;  . CYSTOSCOPY N/A 05/30/2018   Procedure: CYSTOSCOPY;  Surgeon: [name], MD;  Location: [facility];  Service: Gynecology;  Laterality: N/A;  . ROBOTIC ASSISTED LAPAROSCOPIC SACROCOLPOPEXY N/A 05/14/2018   Procedure: XI ROBOTIC ASSISTED LAPAROSCOPIC SACROCOLPOPEXY;  Surgeon: [name], MD;  Location: [facility];  Service: Gynecology;  Laterality: N/A;  6 hours OR time. Need extended stay recovery bed.  Marland Kitchen SALPINGOOPHORECTOMY Bilateral 05/14/2018   Procedure: SALPINGO OOPHORECTOMY;  Surgeon: [name], MD;  Location: [facility];  Service: Gynecology;  Laterality: Bilateral;  . THYMECTOMY  1999  . TRANSVAGINAL TAPE (TVT) REMOVAL N/A 05/30/2018   Procedure: TRANSVAGINAL TAPE (TVT)  Revision;  Surgeon: [name], MD;  Location: [facility];  Service: Gynecology;  Laterality: N/A;  . WISDOM TOOTH EXTRACTION      SOCIAL HISTORY: Social History   Socioeconomic History  . Marital status: Married    Spouse name: [name]  . Number of children: 2  . Years of education: college  . 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 Smoking status: Never Smoker  . Smokeless tobacco: Never Used  Substance and Sexual Activity  . Alcohol use: Yes    Alcohol/week: 0.0 standard drinks    Comment: once a year drink  . Drug use: Never  . Sexual activity: Not Currently    Partners: Male    Birth control/protection: Surgical    Comment: TAH--ovaries remain  Other Topics Concern   . Not on file  Social History Narrative   Lives with husband   Caffeine- coffee, 2 cups daily   Social Determinants of Health   Financial Resource Strain:   . Difficulty of Paying Living Expenses:   Food Insecurity:   . Worried About Charity fundraiser in the Last Year:   . Arboriculturist in the Last Year:   Transportation Needs:   . Film/video editor (Medical):   Marland Kitchen Lack of Transportation (Non-Medical):   Physical Activity:   . Days of Exercise per Week:   . Minutes of Exercise per Session:   Stress:   . Feeling of Stress :   Social Connections:   . Frequency of Communication with Friends and Family:   . Frequency of Social Gatherings with Friends and Family:   . Attends Religious Services:   . Active Member of Clubs or Organizations:   . Attends Archivist Meetings:   Marland Kitchen Marital Status:   Intimate Partner Violence:   . Fear of Current or Ex-Partner:   . Emotionally Abused:   Marland Kitchen Physically Abused:   . Sexually Abused:     FAMILY HISTORY: Family History  Problem Relation Age of Onset  . COPD Father        dec age 58/s-smoked  . Macular degeneration Father   . Diabetes Brother   . Other Brother        committed suicide age 5  . Macular degeneration Brother   . Hyperlipidemia Mother   . Diabetes Maternal Grandmother   . Heart attack Paternal Grandfather     ALLERGIES:  is allergic to ampicillin and penicillins.  MEDICATIONS:  . Current Outpatient Medications:  .  Ascorbic Acid (VITAMIN C) 1000 MG tablet, Take 1,000 mg by mouth 2 (two) times daily., Disp: , Rfl:  .  aspirin EC 81 MG tablet, Take 81 mg by mouth daily., Disp: , Rfl:  .  b complex vitamins capsule, Take 1 capsule by mouth daily., Disp: , Rfl:  .  Cholecalciferol (VITAMIN D3) 30 MCG/15ML LIQD, Take by mouth. Pt. Takes 5000 IU daily, Disp: , Rfl:  .  estradiol (ESTRACE) 0.1 MG/GM vaginal cream, INSERT 1/2 GRAM INTO THE VAGINA AT BEDTIME TWICE WEEKLY, Disp: 43 g, Rfl: 0 .   lenalidomide (REVLIMID) 10 MG capsule, TAKE ONE CAPSULE BY MOUTH DAILY FOR 21  DAYS, THEN 7 DAYS OFF, Disp: 21 capsule, Rfl: 0 .  Magnesium 400 MG CAPS, Take by mouth., Disp: , Rfl:    LABORATORY DATA:  I have reviewed the data as listed  . CBC Latest Ref Rng & Units 03/01/2020 12/30/2019 11/10/2019  WBC 4.0 - 10.5 K/uL 3.3(L) 3.8(L) 5.9  Hemoglobin 12.0 - 15.0 g/dL 13.6 13.0 13.3  Hematocrit 36.0 - 46.0 % 40.9 40.1 40.7  Platelets 150 - 400 K/uL 309 328 299    . CMP Latest Ref Rng & Units 03/01/2020 12/30/2019 11/10/2019  Glucose 70 - 99 mg/dL 84 94 92  BUN 8 - 23 mg/dL 17 15 17   Creatinine 0.44 - 1.00 mg/dL 0.92 0.83 1.17(H)  Sodium 135 - 145 mmol/L 140 140 140  Potassium 3.5 - 5.1 mmol/L 4.2 3.8 4.0  Chloride 98 - 111 mmol/L 105 105 102  CO2 22 - 32 mmol/L 24 27 28   Calcium 8.9 - 10.3 mg/dL 10.0 9.9 9.3  Total Protein 6.5 - 8.1 g/dL 7.8 8.0 8.0  Total Bilirubin 0.3 - 1.2 mg/dL 1.3(H) 1.2 0.8  Alkaline Phos 38 - 126 U/L 80 84 78  AST 15 - 41 U/L 19 26 21   ALT 0 - 44 U/L 28 40 28   10/10/2019 Flow Cytometry (Repeat BM Bx from [facility]):   10/07/18 Repeat BM Bx from [facility]:    RADIOGRAPHIC STUDIES: I have personally reviewed the radiological images as listed and agreed with the findings in the report. No results found.  ASSESSMENT & PLAN:   64 y.o. caucasian female with  1. H/o Multiple myeloma - currently in remission.  IgA lambda R-ISS stage II multiple myeloma with innumerable lytic lesions in the calvarium, lesion in T7 vertebra and multiple lesions in the pelvis.  No significant bone pain at this time. Diagnosed in January 2017. s/p treatment as noted above 10/07/18 BM Bx and Flow cytometry indicate that the pt continues to be in CR 10/10/2019 Flow Cytometry revealed "A. Bone Marrow (Plasma Cell Myeloma Minimal Residual Disease Detection by Flow Cytometry): Negative. No phenotypically abnormal plasma cells at or above the limit of detection identified."  2.Pre-op assessment  for myeloma Held Revlimid from 04/30/18 through her 05/14/18 uterine lift procedure and the bunionectomy on 06/25/18 to decrease risk of clots  PLAN:  -Discussed pt labwork today, 03/01/20; blood counts and chemistries are steady  -Discussed 03/01/2020 Vitamin D 25 (OH) is in progress - will f/u appropriately -Discussed 03/01/2020 MMP is in progress -Discussed 12/30/2019 MMP shows no M Protein observed. Pt's Multiple Myeloma continues to be in remission.  -Will continue Revlimid for maintenance at this time - pt agrees -The pt has no prohibitive toxicities from continuing maintenance Revlimid at this time -Goal Vitamin D >= 60. If less will increase dosage of Vitamin D.  -Continue ASA for VTE prophylaxis -Will see back in 12 weeks with labs    FOLLOW UP:  RTC with Dr Irene Limbo with labs in 3 months   The total time spent in the appt was 20 minutes and more than 50% was on counseling and direct patient cares.  All of the patient's questions were answered with apparent satisfaction. The patient knows to call the clinic with any problems, questions or concerns.   Sullivan Lone MD [place] AAHIVMS [facility] Hematology/Oncology Physician [facility]  (Office):       [phone] (Work cell):  [phone] (Fax):           [phone]   I, Alric Quan  Danelle Earthly, am acting as a Education administrator for Dr. Sullivan Lone.   .I have reviewed the above documentation for accuracy and completeness, and I agree with the above. Brunetta Genera MD

## 2020-03-04 LAB — MULTIPLE MYELOMA PANEL, SERUM
Albumin SerPl Elph-Mcnc: 4.1 g/dL (ref 2.9–4.4)
Albumin/Glob SerPl: 1.3 (ref 0.7–1.7)
Alpha 1: 0.2 g/dL (ref 0.0–0.4)
Alpha2 Glob SerPl Elph-Mcnc: 0.7 g/dL (ref 0.4–1.0)
B-Globulin SerPl Elph-Mcnc: 1.2 g/dL (ref 0.7–1.3)
Gamma Glob SerPl Elph-Mcnc: 1.2 g/dL (ref 0.4–1.8)
Globulin, Total: 3.3 g/dL (ref 2.2–3.9)
IgA: 483 mg/dL — ABNORMAL HIGH (ref 87–352)
IgG (Immunoglobin G), Serum: 1216 mg/dL (ref 586–1602)
IgM (Immunoglobulin M), Srm: 46 mg/dL (ref 26–217)
Total Protein ELP: 7.4 g/dL (ref 6.0–8.5)

## 2020-03-05 ENCOUNTER — Telehealth: Payer: Self-pay | Admitting: Hematology

## 2020-03-05 NOTE — Telephone Encounter (Signed)
Scheduled per los, patient has been called and notified. 

## 2020-03-12 ENCOUNTER — Other Ambulatory Visit: Payer: Self-pay | Admitting: *Deleted

## 2020-03-12 DIAGNOSIS — C9001 Multiple myeloma in remission: Secondary | ICD-10-CM

## 2020-03-12 MED ORDER — LENALIDOMIDE 10 MG PO CAPS: ORAL_CAPSULE | ORAL | 0 refills | Status: DC

## 2020-03-12 NOTE — Telephone Encounter (Signed)
[facility]ed [facility] - requested refill of Revlimid 10 mg Refilled per Dr.Kale's OV note 03/01/20 - refill escribed to pharmacy [facility] auth# [id number], 03/12/20

## 2020-04-13 ENCOUNTER — Other Ambulatory Visit: Payer: Self-pay | Admitting: *Deleted

## 2020-04-13 ENCOUNTER — Telehealth: Payer: Self-pay | Admitting: *Deleted

## 2020-04-13 DIAGNOSIS — C9001 Multiple myeloma in remission: Secondary | ICD-10-CM

## 2020-04-13 MED ORDER — LENALIDOMIDE 10 MG PO CAPS: ORAL_CAPSULE | ORAL | 0 refills | Status: DC

## 2020-04-13 NOTE — Telephone Encounter (Signed)
Patient called - states she received letter from insurance that her new specialty pharmacy is [facility] and that when next refill of Revlimid is escribed, send to [place].  Contacted Alyson L, RPH-CPP at [facility].  Marrion Coy, CPhT confirmed patient's Revlimid needs to be filled at [place] with the new insurance. Benjamine Mola also submitted a new authorization to the new insurance now for the Revlimid.  Refill for Revlimid  will be sent to [facility]

## 2020-04-13 NOTE — Telephone Encounter (Signed)
Patient called - states she received letter from insurance that her new specialty pharmacy is [facility] and that when next refill of Revlimid is escribed, send to [place].  Contacted Alyson L, RPH-CPP at [facility].  Marrion Coy, CPhT confirmed patient's Revlimid needs to be filled at [place] with the new insurance. Benjamine Mola also submitted a new authorization to the new insurance now for the Revlimid.  Refill due now - as Revlimid is to continue at same dose per [name] office note 03/01/20 Refill escribed to [place], [place] auth# [id number], 04/13/20

## 2020-04-14 ENCOUNTER — Telehealth: Payer: Self-pay | Admitting: *Deleted

## 2020-04-14 DIAGNOSIS — C9001 Multiple myeloma in remission: Secondary | ICD-10-CM

## 2020-04-14 MED ORDER — LENALIDOMIDE 10 MG PO CAPS: ORAL_CAPSULE | ORAL | 0 refills | Status: DC

## 2020-04-14 NOTE — Telephone Encounter (Signed)
Patient called. She talked with [employer] and she is not required to use [facility] as she thought for Revlimid. She spoke with [facility] and she [facility] due to better shipping. The Rx for Revlimid was sent to [facility] 7/20. Patient requested Revlimid RX be called to  [facility] [place] ([facility]viously used by patient) [phone]. Rep Jimini asked that Rx be faxed to [phone] attn here. RX faxed. Fax confirmation received  Ms. Aguilera notified by voice meal that RX faxed to [facility] as requested.

## 2020-05-10 ENCOUNTER — Other Ambulatory Visit: Payer: Self-pay | Admitting: *Deleted

## 2020-05-10 DIAGNOSIS — C9001 Multiple myeloma in remission: Secondary | ICD-10-CM

## 2020-05-10 MED ORDER — LENALIDOMIDE 10 MG PO CAPS: ORAL_CAPSULE | ORAL | 0 refills | Status: DC

## 2020-05-10 NOTE — Telephone Encounter (Signed)
Received call from [facility] [place] - requested refill for Revlimid Refilled per [name] OV note 03/01/20 Refill escribed to [place], [address]  [facility] Auth# [id number], 05/10/20

## 2020-06-01 ENCOUNTER — Ambulatory Visit: Payer: Self-pay

## 2020-06-01 ENCOUNTER — Other Ambulatory Visit: Payer: BLUE CROSS/BLUE SHIELD

## 2020-06-01 ENCOUNTER — Ambulatory Visit: Payer: BLUE CROSS/BLUE SHIELD | Admitting: Hematology

## 2020-06-07 ENCOUNTER — Other Ambulatory Visit: Payer: Self-pay | Admitting: *Deleted

## 2020-06-07 DIAGNOSIS — C9001 Multiple myeloma in remission: Secondary | ICD-10-CM

## 2020-06-07 MED ORDER — LENALIDOMIDE 10 MG PO CAPS: ORAL_CAPSULE | ORAL | 0 refills | Status: DC

## 2020-06-07 NOTE — Telephone Encounter (Signed)
Received call from [facility] [employer] requesting refill of Revlimid Revlimid refilled per [name] Office note 03/01/20 Refill escribed to [place], [address] [place] Auth# [id number], 06/07/20

## 2020-06-10 ENCOUNTER — Other Ambulatory Visit: Payer: Self-pay

## 2020-06-10 ENCOUNTER — Inpatient Hospital Stay: Payer: Medicare Other | Attending: Hematology

## 2020-06-10 ENCOUNTER — Inpatient Hospital Stay (HOSPITAL_BASED_OUTPATIENT_CLINIC_OR_DEPARTMENT_OTHER): Payer: Medicare Other | Admitting: Hematology

## 2020-06-10 VITALS — BP 119/75 | HR 78 | Temp 97.7°F | Resp 18 | Ht 67.0 in | Wt 137.5 lb

## 2020-06-10 DIAGNOSIS — I351 Nonrheumatic aortic (valve) insufficiency: Secondary | ICD-10-CM | POA: Diagnosis not present

## 2020-06-10 DIAGNOSIS — G4733 Obstructive sleep apnea (adult) (pediatric): Secondary | ICD-10-CM | POA: Insufficient documentation

## 2020-06-10 DIAGNOSIS — G2581 Restless legs syndrome: Secondary | ICD-10-CM | POA: Diagnosis not present

## 2020-06-10 DIAGNOSIS — E559 Vitamin D deficiency, unspecified: Secondary | ICD-10-CM

## 2020-06-10 DIAGNOSIS — D892 Hypergammaglobulinemia, unspecified: Secondary | ICD-10-CM | POA: Diagnosis not present

## 2020-06-10 DIAGNOSIS — Z9221 Personal history of antineoplastic chemotherapy: Secondary | ICD-10-CM | POA: Insufficient documentation

## 2020-06-10 DIAGNOSIS — E785 Hyperlipidemia, unspecified: Secondary | ICD-10-CM | POA: Diagnosis not present

## 2020-06-10 DIAGNOSIS — I361 Nonrheumatic tricuspid (valve) insufficiency: Secondary | ICD-10-CM | POA: Insufficient documentation

## 2020-06-10 DIAGNOSIS — I340 Nonrheumatic mitral (valve) insufficiency: Secondary | ICD-10-CM | POA: Diagnosis not present

## 2020-06-10 DIAGNOSIS — I253 Aneurysm of heart: Secondary | ICD-10-CM | POA: Insufficient documentation

## 2020-06-10 DIAGNOSIS — M5030 Other cervical disc degeneration, unspecified cervical region: Secondary | ICD-10-CM | POA: Insufficient documentation

## 2020-06-10 DIAGNOSIS — K219 Gastro-esophageal reflux disease without esophagitis: Secondary | ICD-10-CM | POA: Insufficient documentation

## 2020-06-10 DIAGNOSIS — C9001 Multiple myeloma in remission: Secondary | ICD-10-CM

## 2020-06-10 DIAGNOSIS — M5137 Other intervertebral disc degeneration, lumbosacral region: Secondary | ICD-10-CM | POA: Insufficient documentation

## 2020-06-10 DIAGNOSIS — R32 Unspecified urinary incontinence: Secondary | ICD-10-CM | POA: Diagnosis not present

## 2020-06-10 DIAGNOSIS — C9000 Multiple myeloma not having achieved remission: Secondary | ICD-10-CM | POA: Diagnosis not present

## 2020-06-10 DIAGNOSIS — D72819 Decreased white blood cell count, unspecified: Secondary | ICD-10-CM | POA: Diagnosis not present

## 2020-06-10 DIAGNOSIS — N8110 Cystocele, unspecified: Secondary | ICD-10-CM | POA: Insufficient documentation

## 2020-06-10 LAB — CBC WITH DIFFERENTIAL/PLATELET
Abs Immature Granulocytes: 0.01 10*3/uL (ref 0.00–0.07)
Basophils Absolute: 0.1 10*3/uL (ref 0.0–0.1)
Basophils Relative: 2 %
Eosinophils Absolute: 0.2 10*3/uL (ref 0.0–0.5)
Eosinophils Relative: 6 %
HCT: 39 % (ref 36.0–46.0)
Hemoglobin: 12.9 g/dL (ref 12.0–15.0)
Immature Granulocytes: 0 %
Lymphocytes Relative: 20 %
Lymphs Abs: 0.6 10*3/uL — ABNORMAL LOW (ref 0.7–4.0)
MCH: 31.2 pg (ref 26.0–34.0)
MCHC: 33.1 g/dL (ref 30.0–36.0)
MCV: 94.4 fL (ref 80.0–100.0)
Monocytes Absolute: 0.5 10*3/uL (ref 0.1–1.0)
Monocytes Relative: 16 %
Neutro Abs: 1.6 10*3/uL — ABNORMAL LOW (ref 1.7–7.7)
Neutrophils Relative %: 56 %
Platelets: 244 10*3/uL (ref 150–400)
RBC: 4.13 MIL/uL (ref 3.87–5.11)
RDW: 14.3 % (ref 11.5–15.5)
WBC: 2.9 10*3/uL — ABNORMAL LOW (ref 4.0–10.5)
nRBC: 0 % (ref 0.0–0.2)

## 2020-06-10 LAB — CMP (CANCER CENTER ONLY)
ALT: 43 U/L (ref 0–44)
AST: 30 U/L (ref 15–41)
Albumin: 3.8 g/dL (ref 3.5–5.0)
Alkaline Phosphatase: 87 U/L (ref 38–126)
Anion gap: 8 (ref 5–15)
BUN: 13 mg/dL (ref 8–23)
CO2: 27 mmol/L (ref 22–32)
Calcium: 9.5 mg/dL (ref 8.9–10.3)
Chloride: 105 mmol/L (ref 98–111)
Creatinine: 0.87 mg/dL (ref 0.44–1.00)
GFR, Est AFR Am: >60 mL/min (ref >60)
GFR, Estimated: >60 mL/min (ref >60)
Glucose, Bld: 108 mg/dL — ABNORMAL HIGH (ref 70–99)
Potassium: 4.1 mmol/L (ref 3.5–5.1)
Sodium: 140 mmol/L (ref 135–145)
Total Bilirubin: 1.2 mg/dL (ref 0.3–1.2)
Total Protein: 7.9 g/dL (ref 6.5–8.1)

## 2020-06-10 LAB — VITAMIN D 25 HYDROXY (VIT D DEFICIENCY, FRACTURES): Vit D, 25-Hydroxy: 32.74 ng/mL (ref 30–100)

## 2020-06-10 NOTE — Progress Notes (Signed)
HEMATOLOGY/ONCOLOGY CLINIC NOTE  Date of Service: 06/10/20  PCP: .[name], MD  CC:  F/u for continue mx of multiple myeloma  CHIEF COMPLAINTS/PURPOSE OF CONSULTATION:  Follow up for multiple myeloma  DIAGNOSIS  IgA lambda R-ISS stage II multiple myeloma with innumerable lytic lesions in the calvarium, lesion in T7 vertebra and multiple lesions in the pelvis.  No significant bone pain at this time. Diagnosed in January 2017.  Current treatment  Revlimid 49m po daily 3 weeks on 1 week off (after treatment interruption)   Previous Treatment  Status post Vd x 1 cycle VRd x 6 cycles  HD Melphalan 2020mm2 on 03/22/2016 Autologous HSCT on 03/23/2016 [facility] 6.[facility] x10^6/kg)  Current rx -Revlimid maintenance     INTERVAL HISTORY:  Paula CoZeidans here for management and evaluation of her multiple myeloma. The patient's last visit with [place] on 03/01/2020. The pt reports that she is doing well overall.  The pt reports that she has had more episodes of diarrhea, but they are not bothersome or very regular. She does report some constipation. Pt reports that she received her COVID19 vaccines and booster. She also received her flu vaccine yesterday. She is up to date with her cancer screenings.   Lab results today (06/10/20) of CBC w/diff and CMP is as follows: all values are WNL except for WBC at 2.9K, Neutro Abs at 1.6K, Lymphs Abs at 0.6K, Glucose at 108. 06/10/2020 MMP is in progress 06/10/2020 Vitamin D 25 (OH) is in progress  On review of systems, pt reports occasional diarrhea, constipation and denies rash, new bone pain, fevers, chills, night sweats, abdominal pain, skin lesions and any other symptoms.    REVIEW OF SYSTEMS:   A 10+ POINT REVIEW OF SYSTEMS WAS OBTAINED including neurology, dermatology, psychiatry, cardiac, respiratory, lymph, extremities, GI, GU, Musculoskeletal, constitutional, breasts, reproductive, HEENT.  All  pertinent positives are noted in the HPI.  All others are negative.   PHYSICAL EXAMINATION: NAD .BP 119/75 (BP Location: Left Arm, Patient Position: Sitting)   Pulse 78   Temp 97.7 F (36.5 C) (Tympanic)   Resp 18   Ht 5' 7" (1.702 m)   Wt 137 lb 8 oz (62.4 kg)   LMP 09/26/1995 (Within Months)   SpO2 98%   BMI 21.54 kg/m    GENERAL:alert, in no acute distress and comfortable SKIN: no acute rashes, no significant lesions EYES: conjunctiva are pink and non-injected, sclera anicteric OROPHARYNX: MMM, no exudates, no oropharyngeal erythema or ulceration NECK: supple, no JVD LYMPH:  no palpable lymphadenopathy in the cervical, axillary or inguinal regions LUNGS: clear to auscultation b/l with normal respiratory effort HEART: regular rate & rhythm ABDOMEN:  normoactive bowel sounds , non tender, not distended. No palpable hepatosplenomegaly.  Extremity: no pedal edema PSYCH: alert & oriented x 3 with fluent speech NEURO: no focal motor/sensory deficits  MEDICAL HISTORY:   Past Medical History:  Diagnosis Date  . Abdominal pain   . Abnormal Pap smear of vagina 03/22/2017   ASCUS pap and negative HR HPV.  Patient is on immunosuppressive medication.   . Anemia   . AR (aortic regurgitation) 11/30/2017   trace noted on ECHO  . Atrial septal aneurysm per 11-30-17 echo   trivial pericardial effusion  . Bunion 05/13/2018  . Cancer ([place]   squamous cell carcinoma of thymus  . Counseling regarding advanced care planning and goals of care 08/03/2017  . Cystocele, unspecified (CODE) 03/06/2018  .  DDD (degenerative disc disease), cervical   . DDD (degenerative disc disease), lumbosacral   . DYSPNEA 12/04/2007   Qualifier: Diagnosis of  By: Gwenette Greet MD, Armando Reichert   . Facet degeneration of lumbar region   . Facet joint disease of cervical region   . Fatigue   . Fatty liver   . Fibroid    reason for Hysterectomy  . FTT (failure to thrive) in adult 08/09/2017  . Gastroesophageal reflux  disease without esophagitis 01/17/2017  . GERD (gastroesophageal reflux disease)   . Grade I diastolic dysfunction   . Hematuria 11/08/2015  . History of cystocele   . History of stress incontinence   . Hypergammaglobulinemia   . Hyperlipidemia   . HYPERLIPIDEMIA 12/04/2007   Qualifier: Diagnosis of  By: Gwenette Greet MD, Armando Reichert   . Hypoxemia 12/16/2007   Qualifier: Diagnosis of  By: Gwenette Greet MD, Armando Reichert   . Lung nodule   . MALIGNANT NEOPLASM OF THYMUS 12/03/2007   Annotation: resection Qualifier: History of  By: Doy Mince LPN, Megan    . Metastatic multiple myeloma to bone ([place]) 10/19/2015  . Migraine    episode of aphasia worked up with echo by [place] [facility] 11-23-17, has visual migraines  . MR (mitral regurgitation) 11/30/2017   trace noted on ECHO  . Multiple myeloma ([place]) 08/2015   metastatic to bone  . Muscular fasciculation   . Nasal septal deviation   . Nasal turbinate hypertrophy   . Near syncope   . OBSTRUCTIVE SLEEP APNEA 12/03/2007   Qualifier: Diagnosis of  By: Doy Mince LPN, Megan    . Other fatigue   . Peripheral edema   . Plasma cell dyscrasia   . PONV (postoperative nausea and vomiting)    thinks morphine caused nausea  . Postoperative urinary retention 05/14/2018  . Rash 11/01/2015  . Restless leg syndrome   . S/P autologous bone marrow transplantation ([place]) 03/29/2016  . Sleep apnea    uses old oral allialnce for osa due to cpap intolerance  . Status post surgery 05/14/2018  . TR (tricuspid regurgitation) 11/30/2017   trace noted on ECHO  . Urinary incontinence   . Urinary tract infection without hematuria   . UTI (urinary tract infection)   . Vaginal granulation tissue 12/03/2017  . Wears glasses     SURGICAL HISTORY: Past Surgical History:  Procedure Laterality Date  . ABDOMINAL HYSTERECTOMY  1997   TAH--ovaries remain--Dr. Newton Pigg  . ANTERIOR AND POSTERIOR REPAIR N/A 05/14/2018   Procedure: ANTERIOR (CYSTOCELE) AND POSTERIOR REPAIR (RECTOCELE);  Surgeon:  [name], MD;  Location: [facility];  Service: Gynecology;  Laterality: N/A;  . BLADDER SUSPENSION N/A 05/14/2018   Procedure: TRANSVAGINAL TAPE (TVT) PROCEDURE exact midurethral sling;  Surgeon: [name], MD;  Location: [facility];  Service: Gynecology;  Laterality: N/A;  . BONE MARROW TRANSPLANT  2017   done at [place]  . BREAST BIOPSY    . COLONOSCOPY    . CYSTOSCOPY N/A 05/14/2018   Procedure: CYSTOSCOPY;  Surgeon: [name], MD;  Location: [facility];  Service: Gynecology;  Laterality: N/A;  . CYSTOSCOPY N/A 05/30/2018   Procedure: CYSTOSCOPY;  Surgeon: [name], MD;  Location: [facility];  Service: Gynecology;  Laterality: N/A;  . ROBOTIC ASSISTED LAPAROSCOPIC SACROCOLPOPEXY N/A 05/14/2018   Procedure: XI ROBOTIC ASSISTED LAPAROSCOPIC SACROCOLPOPEXY;  Surgeon: [name], MD;  Location: [facility];  Service: Gynecology;  Laterality: N/A;  6 hours OR time. Need extended stay recovery bed.  Marland Kitchen SALPINGOOPHORECTOMY Bilateral 05/14/2018   Procedure: SALPINGO OOPHORECTOMY;  Surgeon: [name], MD;  Location: [facility];  Service: Gynecology;  Laterality: Bilateral;  . THYMECTOMY  1999  . TRANSVAGINAL TAPE (TVT) REMOVAL N/A 05/30/2018   Procedure: TRANSVAGINAL TAPE (TVT)  Revision;  Surgeon: [name], MD;  Location: [facility];  Service: Gynecology;  Laterality: N/A;  . WISDOM TOOTH EXTRACTION      SOCIAL HISTORY: Social History   Socioeconomic History  . Marital status: Married    Spouse name: Paula Johnson  . Number of children: 2  . Years of education: college  . 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 Smoking status: Never Smoker  . Smokeless tobacco: Never Used  Vaping Use  . Vaping Use: Never used  Substance and Sexual Activity  . Alcohol use: Yes    Alcohol/week: 0.0 standard drinks     Comment: once a year drink  . Drug use: Never  . Sexual activity: Not Currently    Partners: Male    Birth control/protection: Surgical    Comment: TAH--ovaries remain  Other Topics Concern  . Not on file  Social History Narrative   Lives with husband   Caffeine- coffee, 2 cups daily   Social Determinants of Health   Financial Resource Strain:   . Difficulty of Paying Living Expenses: Not on file  Food Insecurity:   . Worried About Charity fundraiser in the Last Year: Not on file  . Ran Out of Food in the Last Year: Not on file  Transportation Needs:   . Lack of Transportation (Medical): Not on file  . Lack of Transportation (Non-Medical): Not on file  Physical Activity:   . Days of Exercise per Week: Not on file  . Minutes of Exercise per Session: Not on file  Stress:   . Feeling of Stress : Not on file  Social Connections:   . Frequency of Communication with Friends and Family: Not on file  . Frequency of Social Gatherings with Friends and Family: Not on file  . Attends Religious Services: Not on file  . Active Member of Clubs or Organizations: Not on file  . Attends Archivist Meetings: Not on file  . Marital Status: Not on file  Intimate Partner Violence:   . Fear of Current or Ex-Partner: Not on file  . Emotionally Abused: Not on file  . Physically Abused: Not on file  . Sexually Abused: Not on file    FAMILY HISTORY: Family History  Problem Relation Age of Onset  . COPD Father        dec age 38/s-smoked  . Macular degeneration Father   . Diabetes Brother   . Other Brother        committed suicide age 44  . Macular degeneration Brother   . Hyperlipidemia Mother   . Diabetes Maternal Grandmother   . Heart attack Paternal Grandfather     ALLERGIES:  is allergic to ampicillin and penicillins.  MEDICATIONS:  . Current Outpatient Medications:  .  Ascorbic Acid (VITAMIN C) 1000 MG tablet, Take 1,000 mg by mouth 2 (two) times daily., Disp: , Rfl:   .  aspirin EC 81 MG tablet, Take 81 mg by mouth daily., Disp: , Rfl:  .  b complex vitamins capsule, Take 1 capsule  by mouth daily., Disp: , Rfl:  .  Cholecalciferol (VITAMIN D3) 30 MCG/15ML LIQD, Take by mouth. Pt. Takes 5000 IU daily, Disp: , Rfl:  .  estradiol (ESTRACE) 0.1 MG/GM vaginal cream, INSERT 1/2 GRAM INTO THE VAGINA AT BEDTIME TWICE WEEKLY, Disp: 43 g, Rfl: 0 .  lenalidomide (REVLIMID) 10 MG capsule, TAKE ONE CAPSULE BY MOUTH DAILY FOR 21 DAYS, THEN 7 DAYS OFF, Disp: 21 capsule, Rfl: 0 .  Magnesium 400 MG CAPS, Take by mouth., Disp: , Rfl:    LABORATORY DATA:  I have reviewed the data as listed  . CBC Latest Ref Rng & Units 06/10/2020 03/01/2020 12/30/2019  WBC 4.0 - 10.5 K/uL 2.9(L) 3.3(L) 3.8(L)  Hemoglobin 12.0 - 15.0 g/dL 12.9 13.6 13.0  Hematocrit 36 - 46 % 39.0 40.9 40.1  Platelets 150 - 400 K/uL 244 309 328    . CMP Latest Ref Rng & Units 06/10/2020 03/01/2020 12/30/2019  Glucose 70 - 99 mg/dL 108(H) 84 94  BUN 8 - 23 mg/dL _0 Creatinine 0.44 - 1.00 mg/dL 0.87 0.92 0.83  Sodium 135 - 145 mmol/L 140 140 140  Potassium 3.5 - 5.1 mmol/L 4.1 4.2 3.8  Chloride 98 - 111 mmol/L 105 105 105  CO2 22 - 32 mmol/L _1 Calcium 8.9 - 10.3 mg/dL 9.5 10.0 9.9  Total Protein 6.5 - 8.1 g/dL 7.9 7.8 8.0  Total Bilirubin 0.3 - 1.2 mg/dL 1.2 1.3(H) 1.2  Alkaline Phos 38 - 126 U/L 87 80 84  AST 15 - 41 U/L _2 ALT 0 - 44 U/L 43 28 40   10/10/2019 Flow Cytometry (Repeat BM Bx from [facility]):   10/07/18 Repeat BM Bx from [facility]:    RADIOGRAPHIC STUDIES: I have personally reviewed the radiological images as listed and agreed with the findings in the report. No results found.  ASSESSMENT & PLAN:   64 y.o. caucasian female with  1. H/o Multiple myeloma - currently in remission.  IgA lambda R-ISS stage II multiple myeloma with innumerable lytic lesions in the calvarium, lesion in T7 vertebra and multiple lesions in the pelvis.  No significant bone pain at this  time. Diagnosed in January 2017. s/p treatment as noted above 10/07/18 BM Bx and Flow cytometry indicate that the pt continues to be in CR 10/10/2019 Flow Cytometry revealed "A. Bone Marrow (Plasma Cell Myeloma Minimal Residual Disease Detection by Flow Cytometry): Negative. No phenotypically abnormal plasma cells at or above the limit of detection identified."  2.Pre-op assessment for myeloma Held Revlimid from 04/30/18 through her 05/14/18 uterine lift procedure and the bunionectomy on 06/25/18 to decrease risk of clots  PLAN:  -Discussed pt labwork today, 06/10/20; leukopenia, other blood counts and chemistries look good. Vitamin D is in progress - Goal >60, <90 -Discussed 06/10/2020 MMP is in progress, 03/01/2020 MMP shows no M Protein observed -Pt continues to be remission from Multiple Myeloma at this time.  -The pt has no prohibitive toxicities from continuing maintenance Revlimid at this time. -Pt is up to date with her vaccinations and age appropriate cancer screenings. -Recommend pt continue Vitamin D 5000 IU 5x per week and 10000 IU 2x per week  -Continue ASA for VTE prophylaxis -Will see back in 3 months with labs    FOLLOW UP:  RTC with Dr Irene Limbo with labs in 3 months   The total time spent in the appt was 20 minutes and more than 50% was on counseling and direct patient  cares.  All of the patient's questions were answered with apparent satisfaction. The patient knows to call the clinic with any problems, questions or concerns.   Sullivan Lone MD [place] AAHIVMS [facility] Hematology/Oncology Physician [facility]  (Office):       [phone] (Work cell):  [phone] (Fax):           [phone]   I, Yevette Edwards, am acting as a scribe for Dr. Sullivan Lone.   .I have reviewed the above documentation for accuracy and completeness, and I agree with the above. Brunetta Genera MD

## 2020-06-14 ENCOUNTER — Other Ambulatory Visit: Payer: Self-pay | Admitting: Obstetrics and Gynecology

## 2020-06-14 DIAGNOSIS — Z01419 Encounter for gynecological examination (general) (routine) without abnormal findings: Secondary | ICD-10-CM

## 2020-06-14 LAB — MULTIPLE MYELOMA PANEL, SERUM
Albumin SerPl Elph-Mcnc: 3.6 g/dL (ref 2.9–4.4)
Albumin/Glob SerPl: 1.1 (ref 0.7–1.7)
Alpha 1: 0.2 g/dL (ref 0.0–0.4)
Alpha2 Glob SerPl Elph-Mcnc: 0.7 g/dL (ref 0.4–1.0)
B-Globulin SerPl Elph-Mcnc: 1.2 g/dL (ref 0.7–1.3)
Gamma Glob SerPl Elph-Mcnc: 1.3 g/dL (ref 0.4–1.8)
Globulin, Total: 3.5 g/dL (ref 2.2–3.9)
IgA: 477 mg/dL — ABNORMAL HIGH (ref 87–352)
IgG (Immunoglobin G), Serum: 1237 mg/dL (ref 586–1602)
IgM (Immunoglobulin M), Srm: 48 mg/dL (ref 26–217)
Total Protein ELP: 7.1 g/dL (ref 6.0–8.5)

## 2020-06-14 NOTE — Telephone Encounter (Signed)
Medication refill request: Estradiol 0.1mg  Last AEX:  09/12/19 Next AEX: not yet scheduled Last MMG (if hormonal medication request): 07/14/19  Neg  Refill authorized: 43g/0

## 2020-07-02 ENCOUNTER — Other Ambulatory Visit: Payer: Self-pay | Admitting: *Deleted

## 2020-07-02 DIAGNOSIS — C9001 Multiple myeloma in remission: Secondary | ICD-10-CM

## 2020-07-02 MED ORDER — LENALIDOMIDE 10 MG PO CAPS: ORAL_CAPSULE | ORAL | 0 refills | Status: DC

## 2020-07-02 NOTE — Telephone Encounter (Signed)
Refilled Revlimid per 06/10/20 OV note Refill escribed to [place], [address] [facility] auth# [id number], 07/02/20

## 2020-08-02 ENCOUNTER — Other Ambulatory Visit: Payer: Self-pay

## 2020-08-02 DIAGNOSIS — C9001 Multiple myeloma in remission: Secondary | ICD-10-CM

## 2020-08-02 MED ORDER — LENALIDOMIDE 10 MG PO CAPS: ORAL_CAPSULE | ORAL | 0 refills | Status: DC

## 2020-08-30 ENCOUNTER — Other Ambulatory Visit: Payer: Self-pay | Admitting: *Deleted

## 2020-08-30 DIAGNOSIS — C9001 Multiple myeloma in remission: Secondary | ICD-10-CM

## 2020-08-30 MED ORDER — LENALIDOMIDE 10 MG PO CAPS: ORAL_CAPSULE | ORAL | 0 refills | Status: DC

## 2020-08-30 NOTE — Telephone Encounter (Signed)
Refilled Revlimid per MD OV note 06/10/20 - escribed to   [place], [address] Auth# [id number], 08/30/20

## 2020-09-08 ENCOUNTER — Telehealth: Payer: Self-pay | Admitting: Hematology

## 2020-09-08 NOTE — Telephone Encounter (Signed)
Scheduled per los, patient has been called and notified of upcoming appointment.

## 2020-09-10 ENCOUNTER — Other Ambulatory Visit: Payer: BLUE CROSS/BLUE SHIELD

## 2020-09-10 ENCOUNTER — Inpatient Hospital Stay (HOSPITAL_BASED_OUTPATIENT_CLINIC_OR_DEPARTMENT_OTHER): Payer: Medicare Other | Admitting: Hematology

## 2020-09-10 ENCOUNTER — Inpatient Hospital Stay: Payer: Medicare Other | Attending: Hematology

## 2020-09-10 ENCOUNTER — Other Ambulatory Visit: Payer: Self-pay

## 2020-09-10 ENCOUNTER — Ambulatory Visit: Payer: BLUE CROSS/BLUE SHIELD | Admitting: Hematology

## 2020-09-10 VITALS — BP 128/80 | HR 92 | Temp 97.7°F | Resp 18 | Ht 67.0 in | Wt 137.7 lb

## 2020-09-10 DIAGNOSIS — C9001 Multiple myeloma in remission: Secondary | ICD-10-CM

## 2020-09-10 DIAGNOSIS — E559 Vitamin D deficiency, unspecified: Secondary | ICD-10-CM | POA: Diagnosis not present

## 2020-09-10 LAB — CMP (CANCER CENTER ONLY)
ALT: 31 U/L (ref 0–44)
AST: 21 U/L (ref 15–41)
Albumin: 4 g/dL (ref 3.5–5.0)
Alkaline Phosphatase: 86 U/L (ref 38–126)
Anion gap: 8 (ref 5–15)
BUN: 15 mg/dL (ref 8–23)
CO2: 27 mmol/L (ref 22–32)
Calcium: 10.1 mg/dL (ref 8.9–10.3)
Chloride: 102 mmol/L (ref 98–111)
Creatinine: 0.87 mg/dL (ref 0.44–1.00)
GFR, Estimated: >60 mL/min (ref >60)
Glucose, Bld: 99 mg/dL (ref 70–99)
Potassium: 4.2 mmol/L (ref 3.5–5.1)
Sodium: 137 mmol/L (ref 135–145)
Total Bilirubin: 0.9 mg/dL (ref 0.3–1.2)
Total Protein: 8.1 g/dL (ref 6.5–8.1)

## 2020-09-10 LAB — CBC WITH DIFFERENTIAL/PLATELET
Abs Immature Granulocytes: 0.02 10*3/uL (ref 0.00–0.07)
Basophils Absolute: 0.1 10*3/uL (ref 0.0–0.1)
Basophils Relative: 1 %
Eosinophils Absolute: 0.1 10*3/uL (ref 0.0–0.5)
Eosinophils Relative: 3 %
HCT: 40.5 % (ref 36.0–46.0)
Hemoglobin: 13.4 g/dL (ref 12.0–15.0)
Immature Granulocytes: 0 %
Lymphocytes Relative: 20 %
Lymphs Abs: 0.9 10*3/uL (ref 0.7–4.0)
MCH: 31.3 pg (ref 26.0–34.0)
MCHC: 33.1 g/dL (ref 30.0–36.0)
MCV: 94.6 fL (ref 80.0–100.0)
Monocytes Absolute: 0.5 10*3/uL (ref 0.1–1.0)
Monocytes Relative: 11 %
Neutro Abs: 3 10*3/uL (ref 1.7–7.7)
Neutrophils Relative %: 65 %
Platelets: 292 10*3/uL (ref 150–400)
RBC: 4.28 MIL/uL (ref 3.87–5.11)
RDW: 13.9 % (ref 11.5–15.5)
WBC: 4.6 10*3/uL (ref 4.0–10.5)
nRBC: 0 % (ref 0.0–0.2)

## 2020-09-10 NOTE — Progress Notes (Signed)
HEMATOLOGY/ONCOLOGY CLINIC NOTE  Date of Service: 09/10/20  PCP: .[name], MD  CC:  F/u for continue mx of multiple myeloma  CHIEF COMPLAINTS/PURPOSE OF CONSULTATION:  Follow up for multiple myeloma  DIAGNOSIS  IgA lambda R-ISS stage II multiple myeloma with innumerable lytic lesions in the calvarium, lesion in T7 vertebra and multiple lesions in the pelvis.  No significant bone pain at this time. Diagnosed in January 2017.  Current treatment  Revlimid 24m po daily 3 weeks on 1 week off (after treatment interruption)   Previous Treatment  Status post Vd x 1 cycle VRd x 6 cycles  HD Melphalan 2042mm2 on 03/22/2016 Autologous HSCT on 03/23/2016 [facility] 6.4 x10^6/kg)  Current rx -Revlimid maintenance     INTERVAL HISTORY:  Paula Johnson here for management and evaluation of her multiple myeloma. The patient's last visit with [place] on 06/10/2020. The pt reports that she is doing well overall.  The pt reports that she continues to tolerate Revlimid well. She has received her annual flu vaccine and COVID19 booster. Pt is taking 10000 IU Vitamin D twice a week and 5000 IU the rest of the week.   Lab results today (09/10/20) of CBC w/diff and CMP is as follows: all values are WNL. 09/10/2020 MMP is in progress  On review of systems, pt denies diarrhea, muscle cramps, abdominal pain, new bone pain and any other symptoms.    REVIEW OF SYSTEMS:   A 10+ POINT REVIEW OF SYSTEMS WAS OBTAINED including neurology, dermatology, psychiatry, cardiac, respiratory, lymph, extremities, GI, GU, Musculoskeletal, constitutional, breasts, reproductive, HEENT.  All pertinent positives are noted in the HPI.  All others are negative.   PHYSICAL EXAMINATION: NAD .BP 128/80 (BP Location: Left Arm, Patient Position: Sitting)   Pulse 92   Temp 97.7 F (36.5 C) (Tympanic)   Resp 18   Ht 5' 7"  (1.702 m)   Wt 137 lb 11.2 oz (62.5 kg)   LMP 09/26/1995  (Within Months)   SpO2 99%   BMI 21.57 kg/m    GENERAL:alert, in no acute distress and comfortable SKIN: no acute rashes, no significant lesions EYES: conjunctiva are pink and non-injected, sclera anicteric OROPHARYNX: MMM, no exudates, no oropharyngeal erythema or ulceration NECK: supple, no JVD LYMPH:  no palpable lymphadenopathy in the cervical, axillary or inguinal regions LUNGS: clear to auscultation b/l with normal respiratory effort HEART: regular rate & rhythm ABDOMEN:  normoactive bowel sounds , non tender, not distended. No palpable hepatosplenomegaly.  Extremity: no pedal edema PSYCH: alert & oriented x 3 with fluent speech NEURO: no focal motor/sensory deficits  MEDICAL HISTORY:   Past Medical History:  Diagnosis Date  . Abdominal pain   . Abnormal Pap smear of vagina 03/22/2017   ASCUS pap and negative HR HPV.  Patient is on immunosuppressive medication.   . Anemia   . AR (aortic regurgitation) 11/30/2017   trace noted on ECHO  . Atrial septal aneurysm per 11-30-17 echo   trivial pericardial effusion  . Bunion 05/13/2018  . [place]   squamous cell carcinoma of thymus  . Counseling regarding advanced care planning and goals of care 08/03/2017  . Cystocele, unspecified (CODE) 03/06/2018  . DDD (degenerative disc disease), cervical   . DDD (degenerative disc disease), lumbosacral   . DYSPNEA 12/04/2007   Qualifier: Diagnosis of  By: ClGwenette GreetD, KeArmando Reichert . Facet degeneration of lumbar region   . Facet joint disease of cervical  region   . Fatigue   . Fatty liver   . Fibroid    reason for Hysterectomy  . FTT (failure to thrive) in adult 08/09/2017  . Gastroesophageal reflux disease without esophagitis 01/17/2017  . GERD (gastroesophageal reflux disease)   . Grade I diastolic dysfunction   . Hematuria 11/08/2015  . History of cystocele   . History of stress incontinence   . Hypergammaglobulinemia   . Hyperlipidemia   . HYPERLIPIDEMIA 12/04/2007    Qualifier: Diagnosis of  By: Gwenette Greet MD, Armando Reichert   . Hypoxemia 12/16/2007   Qualifier: Diagnosis of  By: Gwenette Greet MD, Armando Reichert   . Lung nodule   . MALIGNANT NEOPLASM OF THYMUS 12/03/2007   Annotation: resection Qualifier: History of  By: Doy Mince LPN, Megan    . Metastatic multiple myeloma to bone ([place]) 10/19/2015  . Migraine    episode of aphasia worked up with echo by [facility] 11-23-17, has visual migraines  . MR (mitral regurgitation) 11/30/2017   trace noted on ECHO  . Multiple myeloma ([place]) 08/2015   metastatic to bone  . Muscular fasciculation   . Nasal septal deviation   . Nasal turbinate hypertrophy   . Near syncope   . OBSTRUCTIVE SLEEP APNEA 12/03/2007   Qualifier: Diagnosis of  By: Doy Mince LPN, Megan    . Other fatigue   . Peripheral edema   . Plasma cell dyscrasia   . PONV (postoperative nausea and vomiting)    thinks morphine caused nausea  . Postoperative urinary retention 05/14/2018  . Rash 11/01/2015  . Restless leg syndrome   . S/P autologous bone marrow transplantation ([place]) 03/29/2016  . Sleep apnea    uses old oral allialnce for osa due to cpap intolerance  . Status post surgery 05/14/2018  . TR (tricuspid regurgitation) 11/30/2017   trace noted on ECHO  . Urinary incontinence   . Urinary tract infection without hematuria   . UTI (urinary tract infection)   . Vaginal granulation tissue 12/03/2017  . Wears glasses     SURGICAL HISTORY: Past Surgical History:  Procedure Laterality Date  . ABDOMINAL HYSTERECTOMY  1997   TAH--ovaries remain--Dr. Newton Pigg  . ANTERIOR AND POSTERIOR REPAIR N/A 05/14/2018   Procedure: ANTERIOR (CYSTOCELE) AND POSTERIOR REPAIR (RECTOCELE);  Surgeon: [name], MD;  Location: [facility];  Service: Gynecology;  Laterality: N/A;  . BLADDER SUSPENSION N/A 05/14/2018   Procedure: TRANSVAGINAL TAPE (TVT) PROCEDURE exact midurethral sling;  Surgeon: [name], MD;  Location: [facility];  Service: Gynecology;   Laterality: N/A;  . BONE MARROW TRANSPLANT  2017   done at [facility]  . BREAST BIOPSY    . COLONOSCOPY    . CYSTOSCOPY N/A 05/14/2018   Procedure: CYSTOSCOPY;  Surgeon: [name], MD;  Location: [facility];  Service: Gynecology;  Laterality: N/A;  . CYSTOSCOPY N/A 05/30/2018   Procedure: CYSTOSCOPY;  Surgeon: [name], MD;  [facility];  Service: Gynecology;  Laterality: N/A;  . ROBOTIC ASSISTED LAPAROSCOPIC SACROCOLPOPEXY N/A 05/14/2018   Procedure: XI ROBOTIC ASSISTED LAPAROSCOPIC SACROCOLPOPEXY;  Surgeon: [name], MD;  Location: [facility];  Service: Gynecology;  Laterality: N/A;  6 hours OR time. Need extended stay recovery bed.  Marland Kitchen SALPINGOOPHORECTOMY Bilateral 05/14/2018   Procedure: SALPINGO OOPHORECTOMY;  Surgeon: [name], MD;  Location: [facility];  Service: Gynecology;  Laterality: Bilateral;  . THYMECTOMY  1999  .  TRANSVAGINAL TAPE (TVT) REMOVAL N/A 05/30/2018   Procedure: TRANSVAGINAL TAPE (TVT)  Revision;  Surgeon: [name], MD;  Location[facility];  Service: Gynecology;  Laterality: N/A;  . WISDOM TOOTH EXTRACTION      SOCIAL HISTORY: Social History   Socioeconomic History  . Marital status: Married    Spouse name: Paula Johnson  . Number of children: 2  . Years of education: college  . 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 Smoking status: Never Smoker  . Smokeless tobacco: Never Used  Vaping Use  . Vaping Use: Never used  Substance and Sexual Activity  . Alcohol use: Yes    Alcohol/week: 0.0 standard drinks    Comment: once a year drink  . Drug use: Never  . Sexual activity: Not Currently    Partners: Male    Birth control/protection: Surgical    Comment: TAH--ovaries remain  Other Topics Concern  . Not on file  Social History Narrative   Lives with husband   Caffeine- coffee, 2  cups daily   Social Determinants of Health   Financial Resource Strain: Not on file  Food Insecurity: Not on file  Transportation Needs: Not on file  Physical Activity: Not on file  Stress: Not on file  Social Connections: Not on file  Intimate Partner Violence: Not on file    FAMILY HISTORY: Family History  Problem Relation Age of Onset  . COPD Father        dec age 45/s-smoked  . Macular degeneration Father   . Diabetes Brother   . Other Brother        committed suicide age 79  . Macular degeneration Brother   . Hyperlipidemia Mother   . Diabetes Maternal Grandmother   . Heart attack Paternal Grandfather     ALLERGIES:  is allergic to ampicillin and penicillins.  MEDICATIONS:  . Current Outpatient Medications:  .  Ascorbic Acid (VITAMIN C) 1000 MG tablet, Take 1,000 mg by mouth 2 (two) times daily., Disp: , Rfl:  .  aspirin EC 81 MG tablet, Take 81 mg by mouth daily., Disp: , Rfl:  .  b complex vitamins capsule, Take 1 capsule by mouth daily., Disp: , Rfl:  .  Cholecalciferol (VITAMIN D3) 30 MCG/15ML LIQD, Take by mouth. Pt. Takes 5000 IU daily, Disp: , Rfl:  .  estradiol (ESTRACE) 0.1 MG/GM vaginal cream, INSERT 1/2 GRAM INTO THE VAGINA AT BEDTIME TWICE WEEKLY, Disp: 43 g, Rfl: 0 .  lenalidomide (REVLIMID) 10 MG capsule, TAKE ONE CAPSULE BY MOUTH DAILY FOR 21 DAYS, THEN 7 DAYS OFF, Disp: 21 capsule, Rfl: 0 .  Magnesium 400 MG CAPS, Take by mouth., Disp: , Rfl:    LABORATORY DATA:  I have reviewed the data as listed  . CBC Latest Ref Rng & Units 09/10/2020 06/10/2020 03/01/2020  WBC 4.0 - 10.5 K/uL 4.6 2.9(L) 3.3(L)  Hemoglobin 12.0 - 15.0 g/dL 13.4 12.9 13.6  Hematocrit 36.0 - 46.0 % 40.5 39.0 40.9  Platelets 150 - 400 K/uL 292 244 309    . CMP Latest Ref Rng & Units 09/10/2020 06/10/2020 03/01/2020  Glucose 70 - 99 mg/dL 99 108(H) 84  BUN 8 - 23 mg/dL 15 13 17   Creatinine 0.44 - 1.00 mg/dL 0.87 0.87 0.92  Sodium 135 - 145 mmol/L 137 140 140  Potassium 3.5 -  5.1 mmol/L 4.2 4.1 4.2  Chloride  98 - 111 mmol/L 102 105 105  CO2 22 - 32 mmol/L 27 27 24   Calcium 8.9 - 10.3 mg/dL 10.1 9.5 10.0  Total Protein 6.5 - 8.1 g/dL 8.1 7.9 7.8  Total Bilirubin 0.3 - 1.2 mg/dL 0.9 1.2 1.3(H)  Alkaline Phos 38 - 126 U/L 86 87 80  AST 15 - 41 U/L 21 30 19   ALT 0 - 44 U/L 31 43 28   10/10/2019 Flow Cytometry (Repeat BM Bx from [facility]):   10/07/18 Repeat BM Bx from [facility]:    RADIOGRAPHIC STUDIES: I have personally reviewed the radiological images as listed and agreed with the findings in the report. No results found.  ASSESSMENT & PLAN:   64 y.o. caucasian female with  1. H/o Multiple myeloma - currently in remission.  IgA lambda R-ISS stage II multiple myeloma with innumerable lytic lesions in the calvarium, lesion in T7 vertebra and multiple lesions in the pelvis.  No significant bone pain at this time. Diagnosed in January 2017. s/p treatment as noted above 10/07/18 BM Bx and Flow cytometry indicate that the pt continues to be in CR 10/10/2019 Flow Cytometry revealed "A. Bone Marrow (Plasma Cell Myeloma Minimal Residual Disease Detection by Flow Cytometry): Negative. No phenotypically abnormal plasma cells at or above the limit of detection identified."  2.Pre-op assessment for myeloma Held Revlimid from 04/30/18 through her 05/14/18 uterine lift procedure and the bunionectomy on 06/25/18 to decrease risk of clots  PLAN:  -Discussed pt labwork today, 09/10/20; blood counts and chemistries are nml, MMP is in progress. -Discussed 06/10/2020 MMP shows no M Protein observed - Pt continues to be remission from Multiple Myeloma at this time.  -The pt has no prohibitive toxicities from continuing maintenance Revlimid at this time. -Recommend pt continue Vitamin D 5000 IU 5x per week and 10000 IU 2x per week  -Continue ASA for VTE prophylaxis -Will see back in 3 months with labs    FOLLOW UP:  RTC with Dr Irene Limbo with labs in 3 months   The total time spent  in the appt was 15 minutes and more than 50% was on counseling and direct patient cares.  All of the patient's questions were answered with apparent satisfaction. The patient knows to call the clinic with any problems, questions or concerns.   Sullivan Lone MD [place] AAHIVMS [facility] Hematology/Oncology Physician [facility]  (Office):       [phone] (Work cell):  [phone] (Fax):           [phone]   I, Yevette Edwards, am acting as a scribe for Dr. Sullivan Lone.   .I have reviewed the above documentation for accuracy and completeness, and I agree with the above. Brunetta Genera MD

## 2020-09-13 LAB — MULTIPLE MYELOMA PANEL, SERUM
Albumin SerPl Elph-Mcnc: 3.7 g/dL (ref 2.9–4.4)
Albumin/Glob SerPl: 1.1 (ref 0.7–1.7)
Alpha 1: 0.2 g/dL (ref 0.0–0.4)
Alpha2 Glob SerPl Elph-Mcnc: 0.7 g/dL (ref 0.4–1.0)
B-Globulin SerPl Elph-Mcnc: 1.2 g/dL (ref 0.7–1.3)
Gamma Glob SerPl Elph-Mcnc: 1.3 g/dL (ref 0.4–1.8)
Globulin, Total: 3.5 g/dL (ref 2.2–3.9)
IgA: 503 mg/dL — ABNORMAL HIGH (ref 87–352)
IgG (Immunoglobin G), Serum: 1326 mg/dL (ref 586–1602)
IgM (Immunoglobulin M), Srm: 51 mg/dL (ref 26–217)
Total Protein ELP: 7.2 g/dL (ref 6.0–8.5)

## 2020-09-15 ENCOUNTER — Telehealth: Payer: Self-pay

## 2020-09-15 NOTE — Telephone Encounter (Signed)
-----   Message from Merril Abbe, LPN sent at 65/68/1275  1:35 PM EST -----  ----- Message ----- From: Brunetta Genera, MD Sent: 09/14/2020   4:55 PM EST To: Merril Abbe, LPN  PLZ let patient know her labs show she continues to be in remission with no M spike detectable.

## 2020-09-15 NOTE — Telephone Encounter (Signed)
TCT patient per lab results below.

## 2020-09-23 ENCOUNTER — Other Ambulatory Visit: Payer: Self-pay | Admitting: *Deleted

## 2020-09-23 DIAGNOSIS — C9001 Multiple myeloma in remission: Secondary | ICD-10-CM

## 2020-09-23 MED ORDER — LENALIDOMIDE 10 MG PO CAPS: ORAL_CAPSULE | ORAL | 0 refills | Status: DC

## 2020-10-26 ENCOUNTER — Other Ambulatory Visit: Payer: Self-pay

## 2020-10-26 DIAGNOSIS — C9001 Multiple myeloma in remission: Secondary | ICD-10-CM

## 2020-10-26 MED ORDER — LENALIDOMIDE 10 MG PO CAPS: ORAL_CAPSULE | ORAL | 0 refills | Status: DC

## 2020-10-26 NOTE — Telephone Encounter (Signed)
TCT patient regarding Revlimid 10 mg e-prescribed to [facility] for refill.

## 2020-11-18 ENCOUNTER — Other Ambulatory Visit: Payer: Self-pay | Admitting: *Deleted

## 2020-11-18 DIAGNOSIS — C9001 Multiple myeloma in remission: Secondary | ICD-10-CM

## 2020-11-18 MED ORDER — LENALIDOMIDE 10 MG PO CAPS: ORAL_CAPSULE | ORAL | 0 refills | Status: DC

## 2020-11-18 NOTE — Telephone Encounter (Signed)
Received call from  [facility] requesting Revlimid refill for patient Prescription escribed [facility] Auth# [id number]; 11/18/20

## 2020-11-25 ENCOUNTER — Other Ambulatory Visit: Payer: Self-pay | Admitting: Obstetrics and Gynecology

## 2020-11-25 DIAGNOSIS — Z01419 Encounter for gynecological examination (general) (routine) without abnormal findings: Secondary | ICD-10-CM

## 2020-12-09 NOTE — Progress Notes (Signed)
HEMATOLOGY/ONCOLOGY CLINIC NOTE  Date of Service: 12/10/20  PCP: .[name], MD  CC:  F/u for continue mx of multiple myeloma  CHIEF COMPLAINTS/PURPOSE OF CONSULTATION:  Follow up for multiple myeloma  DIAGNOSIS  IgA lambda R-ISS stage II multiple myeloma with innumerable lytic lesions in the calvarium, lesion in T7 vertebra and multiple lesions in the pelvis.  No significant bone pain at this time. Diagnosed in January 2017.  Current treatment  Revlimid 29m po daily 3 weeks on 1 week off (after treatment interruption)   Previous Treatment  Status post Vd x 1 cycle VRd x 6 cycles  HD Melphalan 2026mm2 on 03/22/2016 Autologous HSCT on 03/23/2016 [facility]  Current rx -Revlimid maintenance     INTERVAL HISTORY:   Ms CoKramlichs here for management and evaluation of her multiple myeloma. The patient's last visit with [place] on 09/10/2020. The pt reports that she is doing well overall.  The pt reports no new symptoms or concerns. She notes no new medical issues or medication changes. She notes no issues tolerating the Revlimid. The [facility]. She got a MRD test and it came back negative. She is supposed to have a Telehealth visit on 12/28/2020 with the doctor.  Lab results today 12/10/2020 of CBC w/diff and CMP is as follows: all values are WNL except for Lymphs Abs of 0.5K, Total Bilirubin of 1.3. 12/10/2020 MMP in progress.  On review of systems, pt denies decreased appetite, change in bowel habits, fevers, chills, night sweats, skin rashes, difficulty sleeping, new lumps/bumps, and any other symptoms.  REVIEW OF SYSTEMS:   10 Point review of Systems was done is negative except as noted above.  PHYSICAL EXAMINATION:  GENERAL:alert, in no acute distress and comfortable SKIN: no acute rashes, no  significant lesions EYES: conjunctiva are pink and non-injected, sclera anicteric OROPHARYNX: MMM, no exudates, no oropharyngeal erythema or ulceration NECK: supple, no JVD LYMPH:  no palpable lymphadenopathy in the cervical, axillary or inguinal regions LUNGS: clear to auscultation b/l with normal respiratory effort HEART: regular rate & rhythm ABDOMEN:  normoactive bowel sounds , non tender, not distended. Extremity: no pedal edema PSYCH: alert & oriented x 3 with fluent speech NEURO: no focal motor/sensory deficits   MEDICAL HISTORY:   Past Medical History:  Diagnosis Date  . Abdominal pain   . Abnormal Pap smear of vagina 03/22/2017   ASCUS pap and negative HR HPV.  Patient is on immunosuppressive medication.   . Anemia   . AR (aortic regurgitation) 11/30/2017   trace noted on ECHO  . Atrial septal aneurysm per 11-30-17 echo   trivial pericardial effusion  . Bunion 05/13/2018  . [place]   squamous cell carcinoma of thymus  . Counseling regarding advanced care planning and goals of care 08/03/2017  . Cystocele, unspecified (CODE) 03/06/2018  . DDD (degenerative disc disease), cervical   . DDD (degenerative disc disease), lumbosacral   . DYSPNEA 12/04/2007   Qualifier: Diagnosis of  By: ClGwenette GreetD, KeArmando Reichert . Facet degeneration of lumbar region   . Facet joint disease of cervical region   . Fatigue   . Fatty liver   . Fibroid    reason for Hysterectomy  . FTT (failure to thrive) in adult 08/09/2017  . Gastroesophageal reflux disease without  esophagitis 01/17/2017  . GERD (gastroesophageal reflux disease)   . Grade I diastolic dysfunction   . Hematuria 11/08/2015  . History of cystocele   . History of stress incontinence   . Hypergammaglobulinemia   . Hyperlipidemia   . HYPERLIPIDEMIA 12/04/2007   Qualifier: Diagnosis of  By: Gwenette Greet MD, Armando Reichert   . Hypoxemia 12/16/2007   Qualifier: Diagnosis of  By: Gwenette Greet MD, Armando Reichert   . Lung nodule   . MALIGNANT NEOPLASM OF  THYMUS 12/03/2007   Annotation: resection Qualifier: History of  By: Doy Mince LPN, Megan    . Metastatic multiple myeloma to bone ([place]) 10/19/2015  . Migraine    episode of aphasia worked up with echo by [facility] 11-23-17, has visual migraines  . MR (mitral regurgitation) 11/30/2017   trace noted on ECHO  . Multiple myeloma ([place]) 08/2015   metastatic to bone  . Muscular fasciculation   . Nasal septal deviation   . Nasal turbinate hypertrophy   . Near syncope   . OBSTRUCTIVE SLEEP APNEA 12/03/2007   Qualifier: Diagnosis of  By: Doy Mince LPN, Megan    . Other fatigue   . Peripheral edema   . Plasma cell dyscrasia   . PONV (postoperative nausea and vomiting)    thinks morphine caused nausea  . Postoperative urinary retention 05/14/2018  . Rash 11/01/2015  . Restless leg syndrome   . S/P autologous bone marrow transplantation ([place]) 03/29/2016  . Sleep apnea    uses old oral allialnce for osa due to cpap intolerance  . Status post surgery 05/14/2018  . TR (tricuspid regurgitation) 11/30/2017   trace noted on ECHO  . Urinary incontinence   . Urinary tract infection without hematuria   . UTI (urinary tract infection)   . Vaginal granulation tissue 12/03/2017  . Wears glasses     SURGICAL HISTORY: Past Surgical History:  Procedure Laterality Date  . ABDOMINAL HYSTERECTOMY  1997   TAH--ovaries remain--Dr. Newton Pigg  . ANTERIOR AND POSTERIOR REPAIR N/A 05/14/2018   Procedure: ANTERIOR (CYSTOCELE) AND POSTERIOR REPAIR (RECTOCELE);  Surgeon: [name], MD;  Location: [facility];  Service: Gynecology;  Laterality: N/A;  . BLADDER SUSPENSION N/A 05/14/2018   Procedure: TRANSVAGINAL TAPE (TVT) PROCEDURE exact midurethral sling;  Surgeon: [name], MD;  Location: [facility];  Service: Gynecology;  Laterality: N/A;  . BONE MARROW TRANSPLANT  2017   done at [place]  . BREAST BIOPSY    . COLONOSCOPY    . CYSTOSCOPY N/A 05/14/2018   Procedure: CYSTOSCOPY;  Surgeon:  [name], MD;  Location: [facility];  Service: Gynecology;  Laterality: N/A;  . CYSTOSCOPY N/A 05/30/2018   Procedure: CYSTOSCOPY;  Surgeon: [name], MD;  Location[facility];  Service: Gynecology;  Laterality: N/A;  . ROBOTIC ASSISTED LAPAROSCOPIC SACROCOLPOPEXY N/A 05/14/2018   Procedure: XI ROBOTIC ASSISTED LAPAROSCOPIC SACROCOLPOPEXY;  Surgeon: [name], MD;  Location: [facility];  Service: Gynecology;  Laterality: N/A;  6 hours OR time. Need extended stay recovery bed.  Marland Kitchen SALPINGOOPHORECTOMY Bilateral 05/14/2018   Procedure: SALPINGO OOPHORECTOMY;  Surgeon: [name], MD;  Location: [facility];  Service: Gynecology;  Laterality: Bilateral;  . THYMECTOMY  1999  . TRANSVAGINAL TAPE (TVT) REMOVAL N/A 05/30/2018   Procedure: TRANSVAGINAL TAPE (TVT)  Revision;  Surgeon: [name], MD;  Location: [facility];  Service: Gynecology;  Laterality: N/A;  .  WISDOM TOOTH EXTRACTION      SOCIAL HISTORY: Social History   Socioeconomic History  . Marital status: Married    Spouse name: [name]  . Number of children: 2  . Years of education: college  . 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 Smoking status: Never Smoker  . Smokeless tobacco: Never Used  Vaping Use  . Vaping Use: Never used  Substance and Sexual Activity  . Alcohol use: Yes    Alcohol/week: 0.0 standard drinks    Comment: once a year drink  . Drug use: Never  . Sexual activity: Not Currently    Partners: Male    Birth control/protection: Surgical    Comment: TAH--ovaries remain  Other Topics Concern  . Not on file  Social History Narrative   Lives with husband   Caffeine- coffee, 2 cups daily   Social Determinants of Health   Financial Resource Strain: Not on file  Food Insecurity: Not on file  Transportation Needs: Not on file  Physical  Activity: Not on file  Stress: Not on file  Social Connections: Not on file  Intimate Partner Violence: Not on file    FAMILY HISTORY: Family History  Problem Relation Age of Onset  . COPD Father        dec age 7/s-smoked  . Macular degeneration Father   . Diabetes Brother   . Other Brother        committed suicide age 16  . Macular degeneration Brother   . Hyperlipidemia Mother   . Diabetes Maternal Grandmother   . Heart attack Paternal Grandfather     ALLERGIES:  is allergic to ampicillin and penicillins.  MEDICATIONS:  . Current Outpatient Medications:  .  Ascorbic Acid (VITAMIN C) 1000 MG tablet, Take 1,000 mg by mouth 2 (two) times daily., Disp: , Rfl:  .  aspirin EC 81 MG tablet, Take 81 mg by mouth daily., Disp: , Rfl:  .  b complex vitamins capsule, Take 1 capsule by mouth daily., Disp: , Rfl:  .  Cholecalciferol (VITAMIN D3) 30 MCG/15ML LIQD, Take by mouth. Pt. Takes 5000 IU daily, Disp: , Rfl:  .  estradiol (ESTRACE) 0.1 MG/GM vaginal cream, INSERT 1/2 GRAM INTO THE VAGINA AT BEDTIME TWICE WEEKLY, Disp: 43 g, Rfl: 0 .  lenalidomide (REVLIMID) 10 MG capsule, TAKE ONE CAPSULE BY MOUTH DAILY FOR 21 DAYS, THEN 7 DAYS OFF, Disp: 21 capsule, Rfl: 0 .  Magnesium 400 MG CAPS, Take by mouth., Disp: , Rfl:    LABORATORY DATA:  I have reviewed the data as listed  . CBC Latest Ref Rng & Units 12/10/2020 09/10/2020 06/10/2020  WBC 4.0 - 10.5 K/uL 5.5 4.6 2.9(L)  Hemoglobin 12.0 - 15.0 g/dL 13.4 13.4 12.9  Hematocrit 36.0 - 46.0 % 40.2 40.5 39.0  Platelets 150 - 400 K/uL 287 292 244    . CMP Latest Ref Rng & Units 12/10/2020 09/10/2020 06/10/2020  Glucose 70 - 99 mg/dL 99 99 108(H)  BUN 8 - 23 mg/dL 13 15 13   Creatinine 0.44 - 1.00 mg/dL 0.90 0.87 0.87  Sodium 135 - 145 mmol/L 139 137 140  Potassium 3.5 - 5.1 mmol/L 4.0 4.2 4.1  Chloride 98 - 111 mmol/L 106 102 105  CO2 22 - 32 mmol/L 25 27 27   Calcium 8.9 - 10.3 mg/dL 9.3 10.1 9.5  Total Protein 6.5 - 8.1 g/dL 7.8  8.1 7.9  Total Bilirubin 0.3 - 1.2 mg/dL 1.3(H) 0.9 1.2  Alkaline Phos 38 - 126 U/L 85 86 87  AST 15 - 41 U/L 23 21 30   ALT 0 - 44 U/L 31 31 43   10/10/2019 Flow Cytometry (Repeat BM Bx from [facility]):   10/07/18 Repeat BM Bx from [facility]:    RADIOGRAPHIC STUDIES: I have personally reviewed the radiological images as listed and agreed with the findings in the report. No results found.  ASSESSMENT & PLAN:   65 y.o. caucasian female with  1. H/o Multiple myeloma - currently in remission.  IgA lambda R-ISS stage II multiple myeloma with innumerable lytic lesions in the calvarium, lesion in T7 vertebra and multiple lesions in the pelvis.  No significant bone pain at this time. Diagnosed in January 2017. s/p treatment as noted above 10/07/18 BM Bx and Flow cytometry indicate that the pt continues to be in CR 10/10/2019 Flow Cytometry revealed "A. Bone Marrow (Plasma Cell Myeloma Minimal Residual Disease Detection by Flow Cytometry): Negative. No phenotypically abnormal plasma cells at or above the limit of detection identified."  2.Pre-op assessment for myeloma Held Revlimid from 04/30/18 through her 05/14/18 uterine lift procedure and the bunionectomy on 06/25/18 to decrease risk of clots  PLAN:  -Discussed pt labwork today, 12/10/2020; blood counts normal, blood chemistries normal. -Advised pt that her last MMP in December 2021 showed no m-spike. Sen tout again today. -Advised pt that her [place] studies showed she no longer had the translocation 11;14. -Recommended pt stay up to date with all age related cancer screenings. -Advised pt that since she is MRD negative and continues to be in remission that it would not be unreasonable to end the maintenance Revlimid.  -The pt has no prohibitive toxicities from continuing maintenance Revlimid at this time. -Discussed Evusheld and pt's eligibility. Will send referral for more information. -Continue Vitamin D 5000 IU 5x per week and 10000 IU 2x per  week  -Continue ASA for VTE prophylaxis -Will see back in 3 months with labs.   FOLLOW UP:  Ambulatory Referral for Evusheld RTC with Dr Irene Limbo with labs in 3 months   The total time spent in the appointment was 20 minutes and more than 50% was on counseling and direct patient cares.   All of the patient's questions were answered with apparent satisfaction. The patient knows to call the clinic with any problems, questions or concerns.   Sullivan Lone MD [place] [facility]IV[facility], [place] Hematology/Oncology Physician [facility]  (Office):       [phone] (Work cell):  [phone] (Fax):           [phone]   I, Reinaldo Raddle, am acting as scribe for Dr. Sullivan Lone, MD.      .I have reviewed the above documentation for accuracy and completeness, and I agree with the above. Brunetta Genera MD

## 2020-12-10 ENCOUNTER — Inpatient Hospital Stay (HOSPITAL_BASED_OUTPATIENT_CLINIC_OR_DEPARTMENT_OTHER): Payer: Medicare Other | Admitting: Hematology

## 2020-12-10 ENCOUNTER — Other Ambulatory Visit: Payer: Self-pay | Admitting: *Deleted

## 2020-12-10 ENCOUNTER — Other Ambulatory Visit: Payer: Self-pay

## 2020-12-10 ENCOUNTER — Inpatient Hospital Stay: Payer: Medicare Other | Attending: Hematology

## 2020-12-10 VITALS — BP 106/68 | HR 84 | Temp 98.8°F | Resp 17 | Ht 67.0 in | Wt 139.0 lb

## 2020-12-10 DIAGNOSIS — C9001 Multiple myeloma in remission: Secondary | ICD-10-CM | POA: Insufficient documentation

## 2020-12-10 DIAGNOSIS — Z79899 Other long term (current) drug therapy: Secondary | ICD-10-CM | POA: Diagnosis not present

## 2020-12-10 DIAGNOSIS — Z01419 Encounter for gynecological examination (general) (routine) without abnormal findings: Secondary | ICD-10-CM

## 2020-12-10 DIAGNOSIS — E559 Vitamin D deficiency, unspecified: Secondary | ICD-10-CM

## 2020-12-10 DIAGNOSIS — Z7982 Long term (current) use of aspirin: Secondary | ICD-10-CM | POA: Insufficient documentation

## 2020-12-10 LAB — CMP (CANCER CENTER ONLY)
ALT: 31 U/L (ref 0–44)
AST: 23 U/L (ref 15–41)
Albumin: 4 g/dL (ref 3.5–5.0)
Alkaline Phosphatase: 85 U/L (ref 38–126)
Anion gap: 8 (ref 5–15)
BUN: 13 mg/dL (ref 8–23)
CO2: 25 mmol/L (ref 22–32)
Calcium: 9.3 mg/dL (ref 8.9–10.3)
Chloride: 106 mmol/L (ref 98–111)
Creatinine: 0.9 mg/dL (ref 0.44–1.00)
GFR, Estimated: >60 mL/min (ref >60)
Glucose, Bld: 99 mg/dL (ref 70–99)
Potassium: 4 mmol/L (ref 3.5–5.1)
Sodium: 139 mmol/L (ref 135–145)
Total Bilirubin: 1.3 mg/dL — ABNORMAL HIGH (ref 0.3–1.2)
Total Protein: 7.8 g/dL (ref 6.5–8.1)

## 2020-12-10 LAB — CBC WITH DIFFERENTIAL/PLATELET
Abs Immature Granulocytes: 0.02 10*3/uL (ref 0.00–0.07)
Basophils Absolute: 0.1 10*3/uL (ref 0.0–0.1)
Basophils Relative: 1 %
Eosinophils Absolute: 0.1 10*3/uL (ref 0.0–0.5)
Eosinophils Relative: 2 %
HCT: 40.2 % (ref 36.0–46.0)
Hemoglobin: 13.4 g/dL (ref 12.0–15.0)
Immature Granulocytes: 0 %
Lymphocytes Relative: 9 %
Lymphs Abs: 0.5 10*3/uL — ABNORMAL LOW (ref 0.7–4.0)
MCH: 31.4 pg (ref 26.0–34.0)
MCHC: 33.3 g/dL (ref 30.0–36.0)
MCV: 94.1 fL (ref 80.0–100.0)
Monocytes Absolute: 0.6 10*3/uL (ref 0.1–1.0)
Monocytes Relative: 10 %
Neutro Abs: 4.3 10*3/uL (ref 1.7–7.7)
Neutrophils Relative %: 78 %
Platelets: 287 10*3/uL (ref 150–400)
RBC: 4.27 MIL/uL (ref 3.87–5.11)
RDW: 14.2 % (ref 11.5–15.5)
WBC: 5.5 10*3/uL (ref 4.0–10.5)
nRBC: 0 % (ref 0.0–0.2)

## 2020-12-10 NOTE — Telephone Encounter (Signed)
Pt called requesting Estradiol cream refilled. Last annual 09/12/2019. Pt has upcoming Annual appointment 02/24/2021 with DR. Quincy Simmonds. Will route to Dr. Quincy Simmonds for approval

## 2020-12-10 NOTE — Telephone Encounter (Signed)
Please get copy of mammogram from 2021 or have patient update her mammogram so I can refill her vaginal estrogen until her appointment in June.

## 2020-12-11 ENCOUNTER — Telehealth: Payer: Self-pay | Admitting: Physician Assistant

## 2020-12-11 NOTE — Telephone Encounter (Signed)
Called pt about Evusheld (tixagevimab co-packaged with cilgavimab) for pre-exposure prophylaxis for prevention of coronavirus disease 2019 (COVID-19) caused by the SARS-CoV-2 virus. The patient is a candidate for this therapy given increased risk for severe disease caused by immunosuppression.    Spoke to patient briefly. She is interested in getting set up for evusheld but currently on her way to a funeral and requested a call back on Monday to set up. Will let team know to circle back on Monday.   Angelena Form PA-C  [facility]

## 2020-12-13 ENCOUNTER — Telehealth: Payer: Self-pay | Admitting: Hematology

## 2020-12-13 ENCOUNTER — Other Ambulatory Visit: Payer: Self-pay | Admitting: Adult Health

## 2020-12-13 ENCOUNTER — Encounter: Payer: Self-pay | Admitting: Adult Health

## 2020-12-13 ENCOUNTER — Telehealth: Payer: Self-pay | Admitting: Adult Health

## 2020-12-13 DIAGNOSIS — C9000 Multiple myeloma not having achieved remission: Secondary | ICD-10-CM

## 2020-12-13 NOTE — Telephone Encounter (Signed)
I called patient to discuss Evusheld, a long acting monoclonal antibody injection administered to patients with decreased immune systems or intolerance/allergy to the COVID 19 vaccine as COVID19 prevention.    Unable to reach patient.  LMOM to return my call.  Makaylin Carlo, NP  

## 2020-12-13 NOTE — Telephone Encounter (Signed)
Left message with follow-up appointment per 3/18 los. Gave option to call back to reschedule if needed. 

## 2020-12-13 NOTE — Progress Notes (Signed)
I connected by phone with Paula Johnson on 12/13/2020, 12:07 PM to discuss the potential use of a new treatment, tixagevimab/cilgavimab, for pre-exposure prophylaxis for prevention of coronavirus disease 2019 (COVID-19) caused by the SARS-CoV-2 virus.  This patient is a 65 y.o. female that meets the FDA criteria for Emergency Use Authorization of tixagevimab/cilgavimab for pre-exposure prophylaxis of COVID-19 disease. Pt meets following criteria:  Age >12 yr and weight > 40kg  Not currently infected with SARS-CoV-2 and has no known recent exposure to an individual infected with SARS-CoV-2 AND o Who has moderate to severe immune compromise due to a medical condition or receipt of immunosuppressive medications or treatments and may not mount an adequate immune response to COVID-19 vaccination or  o Vaccination with any available COVID-19 vaccine, according to the approved or authorized schedule, is not recommended due to a history of severe adverse reaction (e.g., severe allergic reaction) to a COVID-19 vaccine(s) and/or COVID-19 vaccine component(s).  o Patient meets the following definition of mod-severe immune compromised status: 4. Lung transplants, other solid organ transplant recipients on continual belatacept therapy, or multiple myeloma (actively receiving treatment)  I have spoken and communicated the following to the patient or parent/caregiver regarding COVID monoclonal antibody treatment:  1. FDA has authorized the emergency use of tixagevimab/cilgavimab for the pre-exposure prophylaxis of COVID-19 in patients with moderate-severe immunocompromised status, who meet above EUA criteria.  2. The significant known and potential risks and benefits of COVID monoclonal antibody, and the extent to which such potential risks and benefits are unknown.  3. Information on available alternative treatments and the risks and benefits of those alternatives, including clinical trials.  4. The patient or  parent/caregiver has the option to accept or refuse COVID monoclonal antibody treatment.  After reviewing this information with the patient, agree to receive tixagevimab/cilgavimab.  She will come into our clinic on 4/8 at 330pm.  Schedule message sent.  She has my phone number to call should she have any questions or concerns.    Scot Dock, NP, 12/13/2020, 12:07 PM

## 2020-12-14 LAB — MULTIPLE MYELOMA PANEL, SERUM
Albumin SerPl Elph-Mcnc: 3.6 g/dL (ref 2.9–4.4)
Albumin/Glob SerPl: 1.1 (ref 0.7–1.7)
Alpha 1: 0.2 g/dL (ref 0.0–0.4)
Alpha2 Glob SerPl Elph-Mcnc: 0.7 g/dL (ref 0.4–1.0)
B-Globulin SerPl Elph-Mcnc: 1.3 g/dL (ref 0.7–1.3)
Gamma Glob SerPl Elph-Mcnc: 1.2 g/dL (ref 0.4–1.8)
Globulin, Total: 3.5 g/dL (ref 2.2–3.9)
IgA: 550 mg/dL — ABNORMAL HIGH (ref 87–352)
IgG (Immunoglobin G), Serum: 1166 mg/dL (ref 586–1602)
IgM (Immunoglobulin M), Srm: 48 mg/dL (ref 26–217)
Total Protein ELP: 7.1 g/dL (ref 6.0–8.5)

## 2020-12-17 ENCOUNTER — Other Ambulatory Visit: Payer: Self-pay

## 2020-12-17 DIAGNOSIS — C9001 Multiple myeloma in remission: Secondary | ICD-10-CM

## 2020-12-17 MED ORDER — LENALIDOMIDE 10 MG PO CAPS: ORAL_CAPSULE | ORAL | 0 refills | Status: DC

## 2020-12-24 ENCOUNTER — Telehealth: Payer: Self-pay | Admitting: *Deleted

## 2020-12-24 DIAGNOSIS — Z8616 Personal history of COVID-19: Secondary | ICD-10-CM

## 2020-12-24 DIAGNOSIS — Z01419 Encounter for gynecological examination (general) (routine) without abnormal findings: Secondary | ICD-10-CM

## 2020-12-24 HISTORY — DX: Personal history of COVID-19: Z86.16

## 2020-12-24 NOTE — Telephone Encounter (Signed)
Patient called requesting refill on estradiol 0.1 mg vaginal cream, she is completely out of Rx annual exam scheduled on 02/24/21. Patient said she has mammogram scheduled on 12/27/20 at [facility]. Please advise

## 2020-12-27 ENCOUNTER — Encounter: Payer: Self-pay | Admitting: Obstetrics and Gynecology

## 2020-12-28 ENCOUNTER — Encounter: Payer: Self-pay | Admitting: Hematology

## 2020-12-30 NOTE — Telephone Encounter (Signed)
Please request a copy of patient's mammogram so I can refill her prescription.  Thank you.

## 2020-12-30 NOTE — Telephone Encounter (Signed)
I called [facility] and the radiologist has not finalized the report yet. I called patient and left a detailed message on cell that Dr.Silva will not refill her estradiol until her she can review her mammogram results.

## 2020-12-31 ENCOUNTER — Other Ambulatory Visit: Payer: Self-pay

## 2020-12-31 ENCOUNTER — Inpatient Hospital Stay: Payer: Medicare Other | Attending: Hematology

## 2020-12-31 DIAGNOSIS — C9000 Multiple myeloma not having achieved remission: Secondary | ICD-10-CM

## 2020-12-31 MED ORDER — EPINEPHRINE 0.3 MG/0.3ML IJ SOAJ: 0.3000 mg | Freq: Once | INTRAMUSCULAR | Status: DC | PRN

## 2020-12-31 MED ORDER — TIXAGEVIMAB (PART OF EVUSHELD) INJECTION
300.0000 mg | Freq: Once | INTRAMUSCULAR | Status: AC
  Administered 2020-12-31: 300 mg via INTRAMUSCULAR
  Filled 2020-12-31: qty 3

## 2020-12-31 MED ORDER — CILGAVIMAB (PART OF EVUSHELD) INJECTION
300.0000 mg | Freq: Once | INTRAMUSCULAR | Status: AC
  Administered 2020-12-31: 300 mg via INTRAMUSCULAR
  Filled 2020-12-31: qty 3

## 2020-12-31 NOTE — Progress Notes (Signed)
Pt waited full 1 hour post evushield observation period with no issues. vss upon leaving

## 2021-01-03 NOTE — Telephone Encounter (Signed)
I have received patient's mammogram report from [facility] for 12/27/20.  She has a left breast mass that is indeterminate.  She is being recalled for a breast ultrasound.   Please place in mammogram hold.   I will need her final breast imaging report before I can determine if vaginal estrogen is appropriate.

## 2021-01-04 NOTE — Telephone Encounter (Signed)
Patient aware of the below. She is scheduled on 01/10/21 for additional imaging.

## 2021-01-05 ENCOUNTER — Encounter: Payer: Self-pay | Admitting: Hematology

## 2021-01-10 ENCOUNTER — Encounter: Payer: Self-pay | Admitting: Obstetrics and Gynecology

## 2021-01-12 NOTE — Telephone Encounter (Signed)
I have received a copy of the patient's left breast ultrasound from 01/10/21 at [facility].  There is no sign of malignancy.  She has a small benign left breast cyst.   She can be removed from mammogram hold and return to routine mammogram screening.   Ok for refill of vaginal estradiol cream 0.1 mg/gm Insert 1/2 gram per vagina at bedtime twice weekly.  Disp:  42.5 gram RF:  None.

## 2021-01-13 MED ORDER — ESTRADIOL 0.1 MG/GM VA CREA: TOPICAL_CREAM | VAGINAL | 0 refills | Status: DC

## 2021-01-13 NOTE — Telephone Encounter (Signed)
Patient informed, Rx sent.  

## 2021-01-14 ENCOUNTER — Other Ambulatory Visit: Payer: Self-pay

## 2021-01-14 DIAGNOSIS — C9001 Multiple myeloma in remission: Secondary | ICD-10-CM

## 2021-01-14 MED ORDER — LENALIDOMIDE 10 MG PO CAPS: ORAL_CAPSULE | ORAL | 0 refills | Status: DC

## 2021-01-22 ENCOUNTER — Encounter: Payer: Self-pay | Admitting: Hematology

## 2021-01-24 ENCOUNTER — Other Ambulatory Visit: Payer: Self-pay

## 2021-02-15 ENCOUNTER — Other Ambulatory Visit: Payer: Self-pay | Admitting: *Deleted

## 2021-02-15 DIAGNOSIS — C9001 Multiple myeloma in remission: Secondary | ICD-10-CM

## 2021-02-15 MED ORDER — LENALIDOMIDE 10 MG PO CAPS: ORAL_CAPSULE | ORAL | 0 refills | Status: DC

## 2021-02-15 NOTE — Telephone Encounter (Signed)
Contacted by [place] requested refill of Revlimid for patient

## 2021-02-23 NOTE — Progress Notes (Signed)
65 y.o. G14P2002 Married Caucasian female here breast and pelvic exam.    No problems with bladder function or control.   Still using vaginal estrogen cream.   PCP: [name], MD   Patient's last menstrual period was 09/26/1995 (within months).           Sexually active: No.  The current method of family planning is status post hysterectomy.    Exercising: No.  The patient does not participate in regular exercise at present. Smoker:  no  Health Maintenance: Pap: 03-22-17 ASCUS:Neg HR HPV History of abnormal Pap: Yes MMG:  12-27-20 3D/0.7 cm oval mass Lt.Br.6 o'clock;Rt.Br.Neg.  Left breast US - benign ovarian cyst.  Return to routine mammogram screening.  Colonoscopy: 2021 normal;next due 5 yrs d/t hx of polyps BMD: years ago  Result :normal per patient.  Used Zometa in the past.  TDaP:  2018 Gardasil:   no HIV: neg in past Hep C: Neg in past Screening Labs:  PCP.    reports that she has never smoked. She has never used smokeless tobacco. She reports previous alcohol use. She reports that she does not use drugs.  Past Medical History:  Diagnosis Date  . Abdominal pain   . Abnormal Pap smear of vagina 03/22/2017   ASCUS pap and negative HR HPV.  Patient is on immunosuppressive medication.   . Anemia   . AR (aortic regurgitation) 11/30/2017   trace noted on ECHO  . Atrial septal aneurysm per 11-30-17 echo   trivial pericardial effusion  . Bunion 05/13/2018  . Cancer ([place]) 1999   squamous cell carcinoma of thymus  . Counseling regarding advanced care planning and goals of care 08/03/2017  . Cystocele, unspecified (CODE) 03/06/2018  . DDD (degenerative disc disease), cervical   . DDD (degenerative disc disease), lumbosacral   . DYSPNEA 12/04/2007   Qualifier: Diagnosis of  By: Gwenette Greet MD, Armando Reichert   . Facet degeneration of lumbar region   . Facet joint disease of cervical region   . Fatigue   . Fatty liver   . Fibroid    reason for Hysterectomy  . FTT (failure to thrive) in  adult 08/09/2017  . Gastroesophageal reflux disease without esophagitis 01/17/2017  . GERD (gastroesophageal reflux disease)   . Grade I diastolic dysfunction   . Hematuria 11/08/2015  . History of COVID-19 12/2020  . History of cystocele   . History of stress incontinence   . Hypergammaglobulinemia   . Hyperlipidemia   . HYPERLIPIDEMIA 12/04/2007   Qualifier: Diagnosis of  By: Gwenette Greet MD, Armando Reichert   . Hypoxemia 12/16/2007   Qualifier: Diagnosis of  By: Gwenette Greet MD, Armando Reichert   . Lung nodule   . MALIGNANT NEOPLASM OF THYMUS 12/03/2007   Annotation: resection Qualifier: History of  By: Doy Mince LPN, Megan    . Metastatic multiple myeloma to bone ([place]) 10/19/2015  . Migraine    episode of aphasia worked up with echo by [facility] 11-23-17, has visual migraines  . MR (mitral regurgitation) 11/30/2017   trace noted on ECHO  . Multiple myeloma ([place]) 08/2015   metastatic to bone  . Muscular fasciculation   . Nasal septal deviation   . Nasal turbinate hypertrophy   . Near syncope   . OBSTRUCTIVE SLEEP APNEA 12/03/2007   Qualifier: Diagnosis of  By: Doy Mince LPN, Megan    . Other fatigue   . Peripheral edema   . Plasma cell dyscrasia   . PONV (postoperative nausea and vomiting)  thinks morphine caused nausea  . Postoperative urinary retention 05/14/2018  . Rash 11/01/2015  . Restless leg syndrome   . S/P autologous bone marrow transplantation ([place]) 03/29/2016  . Sleep apnea    uses old oral allialnce for osa due to cpap intolerance  . Status post surgery 05/14/2018  . TR (tricuspid regurgitation) 11/30/2017   trace noted on ECHO  . Urinary incontinence   . Urinary tract infection without hematuria   . UTI (urinary tract infection)   . Vaginal granulation tissue 12/03/2017  . Wears glasses     Past Surgical History:  Procedure Laterality Date  . ABDOMINAL HYSTERECTOMY  1997   TAH--ovaries remain--Dr. Newton Pigg  . ANTERIOR AND POSTERIOR REPAIR N/A 05/14/2018   Procedure:  ANTERIOR (CYSTOCELE) AND POSTERIOR REPAIR (RECTOCELE);  Surgeon: [name], MD;  Location: [facility];  Service: Gynecology;  Laterality: N/A;  . BLADDER SUSPENSION N/A 05/14/2018   Procedure: TRANSVAGINAL TAPE (TVT) PROCEDURE exact midurethral sling;  Surgeon: [name], MD;  Location: [facility];  Service: Gynecology;  Laterality: N/A;  . BONE MARROW TRANSPLANT  2017   done at [place]  . BREAST BIOPSY    . COLONOSCOPY    . CYSTOSCOPY N/A 05/14/2018   Procedure: CYSTOSCOPY;  Surgeon: [name], MD;  Location: [facility];  Service: Gynecology;  Laterality: N/A;  . CYSTOSCOPY N/A 05/30/2018   Procedure: CYSTOSCOPY;  Surgeon: [name], MD;  Location[facility];  Service: Gynecology;  Laterality: N/A;  . ROBOTIC ASSISTED LAPAROSCOPIC SACROCOLPOPEXY N/A 05/14/2018   Procedure: XI ROBOTIC ASSISTED LAPAROSCOPIC SACROCOLPOPEXY;  Surgeon: [name], MD;  Location: [facility];  Service: Gynecology;  Laterality: N/A;  6 hours OR time. Need extended stay recovery bed.  Marland Kitchen SALPINGOOPHORECTOMY Bilateral 05/14/2018   Procedure: SALPINGO OOPHORECTOMY;  Surgeon: [name], MD;  Location: [facility];  Service: Gynecology;  Laterality: Bilateral;  . THYMECTOMY  1999  . TRANSVAGINAL TAPE (TVT) REMOVAL N/A 05/30/2018   Procedure: TRANSVAGINAL TAPE (TVT)  Revision;  Surgeon: [name], MD;  Location: [facility];  Service: Gynecology;  Laterality: N/A;  . WISDOM TOOTH EXTRACTION      Current Outpatient Medications  Medication Sig Dispense Refill  . Ascorbic Acid (VITAMIN C) 1000 MG tablet Take 1,000 mg by mouth 2 (two) times daily.    Marland Kitchen aspirin EC 81 MG tablet Take 81 mg by mouth daily.    Marland Kitchen b complex vitamins capsule Take 1 capsule by mouth daily.    . Cholecalciferol (VITAMIN D3) 30 MCG/15ML LIQD Take by mouth. Pt. Takes 5000 IU daily    . EPINEPHrine 0.3 mg/0.3 mL IJ SOAJ injection INJECT  INTRAMUSCULARLLY AS DIRECTED AS NEEDED FOR ANAPHYLAXIS    . estradiol (ESTRACE) 0.1 MG/GM vaginal cream Insert 1/2 gram vaginally twice weekly at bedtime. 43 g 0  . lenalidomide (REVLIMID) 10 MG capsule TAKE ONE CAPSULE BY MOUTH DAILY FOR 21 DAYS, THEN 7 DAYS OFF 21 capsule 0  . Magnesium 400 MG CAPS Take by mouth.     No current facility-administered medications for this visit.    Family History  Problem Relation Age of Onset  . COPD Father        dec age 37/s-smoked  . Macular degeneration Father   . Diabetes Brother   . Other Brother        committed suicide age 9  . Macular  degeneration Brother   . Hyperlipidemia Mother   . Diabetes Maternal Grandmother   . Heart attack Paternal Grandfather     Review of Systems  All other systems reviewed and are negative.   Exam:   BP 106/60   Pulse 85   Ht 5' 7.5" (1.715 m)   Wt 136 lb (61.7 kg)   LMP 09/26/1995 (Within Months)   SpO2 97%   BMI 20.99 kg/m     General appearance: alert, cooperative and appears stated age Head: normocephalic, without obvious abnormality, atraumatic Neck: no adenopathy, supple, symmetrical, trachea midline and thyroid normal to inspection and palpation Lungs: clear to auscultation bilaterally Breasts: normal appearance, no masses or tenderness, No nipple retraction or dimpling, No nipple discharge or bleeding, No axillary adenopathy Heart: regular rate and rhythm Abdomen: soft, non-tender; no masses, no organomegaly Extremities: extremities normal, atraumatic, no cyanosis or edema Skin: skin color, texture, turgor normal. No rashes or lesions Lymph nodes: cervical, supraclavicular, and axillary nodes normal. Neurologic: grossly normal  Pelvic: External genitalia:  no lesions              No abnormal inguinal nodes palpated.              Urethra:  normal appearing urethra with no masses, tenderness or lesions              Bartholins and Skenes: normal                 Vagina: normal appearing  vagina with normal color and discharge, no lesions.  Mesh protected and not exposed.  Good vaginal support.               Cervix: absent              Pap taken: Yes.   Bimanual Exam:  Uterus:  Absent.              Adnexa: no mass, fullness, tenderness              Rectal exam: Yes.  .  Confirms.              Anus:  normal sphincter tone, no lesions  Chaperone was present for exam.  Assessment:    Status post TAH.  Status post robotically assistedbilateralsalpingo-oophorectomy,sacral colpopexy, anterior and posterior colporrhaphy, TVT Exact midurethral sling and cystoscopy. Status post revision of midurethral sling. ASCUS pap and negative HR HPV 2018.  Abnormal pap of the vagina.  Pelvic exam without abnormal finding.  Screening breast exam.  Encounter for medication monitoring.  Hx multiple myeloma. Hx murmur.  Hx aortic regurgitation.   Plan: Mammogram screening discussed.   Self breast awareness reviewed. Pap and reflex HR HPV collected. Guidelines for Calcium, Vitamin D, regular exercise program including cardiovascular and weight bearing exercise. Ok to continue vaginal estrace cream 1/2 grm pv at hs twice weekly.  I discussed potential effect on breast cancer.  BMD per oncology. Follow up annually and prn.   32 min  total time was spent for this patient encounter, including preparation, face-to-face counseling with the patient, coordination of care, and documentation of the encounter.

## 2021-02-24 ENCOUNTER — Other Ambulatory Visit: Payer: Self-pay

## 2021-02-24 ENCOUNTER — Ambulatory Visit (INDEPENDENT_AMBULATORY_CARE_PROVIDER_SITE_OTHER): Payer: Medicare Other | Admitting: Obstetrics and Gynecology

## 2021-02-24 ENCOUNTER — Other Ambulatory Visit (HOSPITAL_COMMUNITY)
Admission: RE | Admit: 2021-02-24 | Discharge: 2021-02-24 | Disposition: A | Discharge status: Home / Self Care | Payer: Medicare Other | Source: Ambulatory Visit | Attending: Obstetrics and Gynecology | Admitting: Obstetrics and Gynecology

## 2021-02-24 ENCOUNTER — Encounter: Payer: Self-pay | Admitting: Obstetrics and Gynecology

## 2021-02-24 VITALS — BP 106/60 | HR 85 | Ht 67.5 in | Wt 136.0 lb

## 2021-02-24 DIAGNOSIS — N952 Postmenopausal atrophic vaginitis: Secondary | ICD-10-CM

## 2021-02-24 DIAGNOSIS — Z124 Encounter for screening for malignant neoplasm of cervix: Secondary | ICD-10-CM | POA: Insufficient documentation

## 2021-02-24 DIAGNOSIS — Z9189 Other specified personal risk factors, not elsewhere classified: Secondary | ICD-10-CM

## 2021-02-24 DIAGNOSIS — Z1239 Encounter for other screening for malignant neoplasm of breast: Secondary | ICD-10-CM | POA: Diagnosis not present

## 2021-02-24 DIAGNOSIS — Z01419 Encounter for gynecological examination (general) (routine) without abnormal findings: Secondary | ICD-10-CM

## 2021-02-24 DIAGNOSIS — R87629 Unspecified abnormal cytological findings in specimens from vagina: Secondary | ICD-10-CM | POA: Diagnosis not present

## 2021-02-24 DIAGNOSIS — Z5181 Encounter for therapeutic drug level monitoring: Secondary | ICD-10-CM

## 2021-02-24 MED ORDER — ESTRADIOL 0.1 MG/GM VA CREA: TOPICAL_CREAM | VAGINAL | 1 refills | Status: DC

## 2021-02-24 NOTE — Patient Instructions (Signed)

## 2021-02-28 ENCOUNTER — Other Ambulatory Visit: Payer: Self-pay | Admitting: *Deleted

## 2021-02-28 DIAGNOSIS — R87612 Low grade squamous intraepithelial lesion on cytologic smear of cervix (LGSIL): Secondary | ICD-10-CM

## 2021-02-28 LAB — CYTOLOGY - PAP

## 2021-03-09 NOTE — Progress Notes (Signed)
  Subjective:     Patient ID: Paula Johnson, female   DOB: 20-Nov-1955, 65 y.o.   MRN: [id number]  HPI Patient here today for colposcopy with pap 02-24-21 showing LGSIL.  PAP HISTORY; 02-24-21 LGSIL 03-22-17 ASCUS:Neg HR HPV   Review of Systems  All other systems reviewed and are negative. LMP: Hyst      Objective:   Physical Exam  Colposcopy of vagina.  Consent done  Acetic acid placed.  Atrophy noted left vaginal side wall.  No acetowhite lesions using incandescent and green light filter.  Lugol's placed.   Decreased uptake of right and left vaginal apex.  Biopsy of right and left vaginal apex sent to pathology separately.  Monsel's placed.  Minimal EBL.  No complications.    Assessment:     LGSIL pap of vagina. Vaginal atrophy noted.     Plan:     FU biopsy results.  Post procedure precautions given. Increase use of vaginal estrogen cream to 1/2 gram pv three times a week after has healed from colposcopic biopsy.

## 2021-03-14 ENCOUNTER — Ambulatory Visit (INDEPENDENT_AMBULATORY_CARE_PROVIDER_SITE_OTHER): Payer: Medicare Other | Admitting: Obstetrics and Gynecology

## 2021-03-14 ENCOUNTER — Encounter: Payer: Self-pay | Admitting: Obstetrics and Gynecology

## 2021-03-14 ENCOUNTER — Other Ambulatory Visit (HOSPITAL_COMMUNITY)
Admission: RE | Admit: 2021-03-14 | Discharge: 2021-03-14 | Disposition: A | Discharge status: Home / Self Care | Payer: Medicare Other | Source: Ambulatory Visit | Attending: Obstetrics and Gynecology | Admitting: Obstetrics and Gynecology

## 2021-03-14 ENCOUNTER — Other Ambulatory Visit: Payer: Self-pay

## 2021-03-14 ENCOUNTER — Other Ambulatory Visit: Payer: Self-pay | Admitting: *Deleted

## 2021-03-14 VITALS — BP 134/72 | Ht 67.0 in | Wt 136.0 lb

## 2021-03-14 DIAGNOSIS — C9001 Multiple myeloma in remission: Secondary | ICD-10-CM

## 2021-03-14 DIAGNOSIS — R87622 Low grade squamous intraepithelial lesion on cytologic smear of vagina (LGSIL): Secondary | ICD-10-CM | POA: Insufficient documentation

## 2021-03-14 DIAGNOSIS — N952 Postmenopausal atrophic vaginitis: Secondary | ICD-10-CM

## 2021-03-14 MED ORDER — LENALIDOMIDE 10 MG PO CAPS: ORAL_CAPSULE | ORAL | 0 refills | Status: DC

## 2021-03-14 NOTE — Patient Instructions (Signed)
Colposcopy, Care After This sheet gives you information about how to care for yourself after your procedure. Your doctor may also give you more specific instructions. If youhave problems or questions, contact your doctor. What can I expect after the procedure? If you did not have a sample of your tissue taken out (did not have a biopsy), you may only have some spotting of blood for a few days. You can go back toyour normal activities. If you had a sample of your tissue taken out, it is common to have: Soreness and mild pain. These may last for a few days. A light-headed feeling. Mild bleeding or fluid (discharge) coming from your vagina. The fluid will look dark and grainy. You may have this for a few days. The fluid may be caused by a liquid that was used during your procedure. You may need to wear a sanitary pad. Spotting of blood for at least 48 hours after the procedure. Follow these instructions at home: Medicines Take over-the-counter and prescription medicines only as told by your doctor. Ask your doctor what medicines you can start taking again. This is very important if you take blood thinners. Activity Limit your activity for the first day after your procedure as told by your doctor. For at least 3 days, or for as long as told by your doctor, avoid: Douching. Using tampons. Having sex. Return to your normal activities as told by your doctor. Ask your doctor what activities are safe for you. General instructions  Drink enough fluid to keep your pee (urine) pale yellow. Ask your doctor if you may take baths, swim, or use a hot tub. You may take showers. If you use birth control (contraception), keep using it. Keep all follow-up visits as told by your doctor. This is important.  Contact a doctor if: You get a skin rash. Get help right away if: You bleed a lot from your vagina. A lot of bleeding means you use more than one pad an hour for 2 hours in a row. You have clumps of  blood (blood clots) coming from your vagina. You have a fever or chills. You have signs of infection. This may be fluid coming from your vagina that is: Different than normal. Yellow. Bad-smelling. You have very bad pain or cramps in your lower belly that do not get better with medicine. You faint. Summary If you did not have a sample of your tissue taken out, you may only have some spotting of blood for a few days. You can go back to your normal activities. If you had a sample of your tissue taken out, it is common to have mild pain for a few days and spotting for 48 hours. Avoid douching, using tampons, and having sex for at least 3 days after the procedure or for as long as told. Get help right away if you have a lot of bleeding, very bad pain, or signs of infection. This information is not intended to replace advice given to you by your health care provider. Make sure you discuss any questions you have with your healthcare provider. Document Revised: 07/13/2020 Document Reviewed: 09/10/2019 Elsevier Patient Education  2022 [employer]  

## 2021-03-17 LAB — SURGICAL PATHOLOGY

## 2021-03-21 ENCOUNTER — Inpatient Hospital Stay: Payer: Medicare Other

## 2021-03-21 ENCOUNTER — Telehealth: Payer: Self-pay | Admitting: Hematology

## 2021-03-21 ENCOUNTER — Inpatient Hospital Stay: Payer: Medicare Other | Admitting: Hematology

## 2021-03-21 NOTE — Telephone Encounter (Signed)
Left message with today's rescheduled appointment due to provider not in office.

## 2021-04-07 NOTE — Progress Notes (Signed)
HEMATOLOGY/ONCOLOGY CLINIC NOTE  Date of Service: 04/08/21  PCP: [name], MD  CHIEF COMPLAINT: F/u for continue mx of multiple myeloma  CHIEF COMPLAINTS/PURPOSE OF CONSULTATION:  Follow up for multiple myeloma   DIAGNOSIS   IgA lambda R-ISS stage II multiple myeloma with innumerable lytic lesions in the calvarium, lesion in T7 vertebra and multiple lesions in the pelvis.  No significant bone pain at this time. Diagnosed in January 2017.   Current treatment   Revlimid 71m po daily 3 weeks on 1 week off (after treatment interruption)  Previous Treatment   Status post Vd x 1 cycle VRd x 6 cycles   HD Melphalan 2022mm2 on 03/22/2016 Autologous HSCT on 03/23/2016 [facility]   Current rx -Revlimid maintenance    INTERVAL HISTORY:   Paula Johnson here for management and evaluation of her multiple myeloma. The patient's last visit with [place] on 12/10/2020. The pt reports that she is doing well overall.  The pt reports no new symptoms or concerns. She continues to tolerate the Revlimid well with no new side effects. She tolerated the Evusheld well. The pt notes her pap smear showed abnormal PPV cells, but they are just continuing to monitor tis. She is up to date with all age related cancer screenings.  Lab results today 04/08/2021 of CBC w/diff and CMP is as follows: all values are WNL except for Glucose of 138. 04/08/2021 MMP- no M spike.  On review of systems, pt denies diarrhea, infection issues, fevers, chills, night sweats, abdominal pain, new bone pains, changes in bowel habits or urination, skin rashes, and any other symptoms.   REVIEW OF SYSTEMS:   10 Point review of Systems was done is negative except as noted above.  PHYSICAL EXAMINATION:  NAD. GENERAL:alert, in no acute distress and comfortable SKIN: no acute rashes, no significant lesions EYES: conjunctiva are pink and non-injected, sclera  anicteric OROPHARYNX: MMM, no exudates, no oropharyngeal erythema or ulceration NECK: supple, no JVD LYMPH:  no palpable lymphadenopathy in the cervical, axillary or inguinal regions LUNGS: clear to auscultation b/l with normal respiratory effort HEART: regular rate & rhythm ABDOMEN:  normoactive bowel sounds , non tender, not distended. Extremity: no pedal edema PSYCH: alert & oriented x 3 with fluent speech NEURO: no focal motor/sensory deficits   MEDICAL HISTORY:   Past Medical History:  Diagnosis Date   Abdominal pain    Abnormal Pap smear of vagina 03/22/2017   ASCUS pap and negative HR HPV.  Patient is on immunosuppressive medication.    Anemia    AR (aortic regurgitation) 11/30/2017   trace noted on ECHO   Atrial septal aneurysm per 11-30-17 echo   trivial pericardial effusion   Bunion 05/13/2018   [place]   squamous cell carcinoma of thymus   Counseling regarding advanced care planning and goals of care 08/03/2017   Cystocele, unspecified (CODE) 03/06/2018   DDD (degenerative disc disease), cervical    DDD (degenerative disc disease), lumbosacral    DYSPNEA 12/04/2007   Qualifier: Diagnosis of  By: ClGwenette GreetD, KeArmando Reichert  Facet degeneration of lumbar region    Facet joint disease of cervical region    Fatigue    Fatty liver    Fibroid    reason for Hysterectomy   FTT (failure to thrive) in adult 08/09/2017   Gastroesophageal reflux disease without esophagitis 01/17/2017   GERD (gastroesophageal reflux disease)    Grade I diastolic  dysfunction    Hematuria 11/08/2015   History of COVID-19 12/2020   History of cystocele    History of stress incontinence    Hypergammaglobulinemia    Hyperlipidemia    HYPERLIPIDEMIA 12/04/2007   Qualifier: Diagnosis of  By: Gwenette Greet MD, Armando Reichert    Hypoxemia 12/16/2007   Qualifier: Diagnosis of  By: Gwenette Greet MD, Armando Reichert    Lung nodule    MALIGNANT NEOPLASM OF THYMUS 12/03/2007   Annotation: resection Qualifier: History of  By:  Doy Mince LPN, Megan     Metastatic multiple myeloma to bone ([place]) 10/19/2015   Migraine    episode of aphasia worked up with echo by [place] cardiology 11-23-17, has visual migraines   MR (mitral regurgitation) 11/30/2017   trace noted on ECHO   Multiple myeloma ([place]) 08/2015   metastatic to bone   Muscular fasciculation    Nasal septal deviation    Nasal turbinate hypertrophy    Near syncope    OBSTRUCTIVE SLEEP APNEA 12/03/2007   Qualifier: Diagnosis of  By: Doy Mince LPN, Megan     Other fatigue    Peripheral edema    Plasma cell dyscrasia    PONV (postoperative nausea and vomiting)    thinks morphine caused nausea   Postoperative urinary retention 05/14/2018   Rash 11/01/2015   Restless leg syndrome    S/P autologous bone marrow transplantation ([place]) 03/29/2016   Sleep apnea    uses old oral allialnce for osa due to cpap intolerance   Status post surgery 05/14/2018   TR (tricuspid regurgitation) 11/30/2017   trace noted on ECHO   Urinary incontinence    Urinary tract infection without hematuria    UTI (urinary tract infection)    Vaginal granulation tissue 12/03/2017   Wears glasses     SURGICAL HISTORY: Past Surgical History:  Procedure Laterality Date   ABDOMINAL HYSTERECTOMY  1997   TAH--ovaries remain--Dr. Newton Pigg   ANTERIOR AND POSTERIOR REPAIR N/A 05/14/2018   Procedure: ANTERIOR (CYSTOCELE) AND POSTERIOR REPAIR (RECTOCELE);  Surgeon: [name], MD;  [facility];  Service: Gynecology;  Laterality: N/A;   BLADDER SUSPENSION N/A 05/14/2018   Procedure: TRANSVAGINAL TAPE (TVT) PROCEDURE exact midurethral sling;  Surgeon: [name], MD;  [facility];  Service: Gynecology;  Laterality: N/A;   BONE MARROW TRANSPLANT  2017   done at [place] N/A 05/14/2018   Procedure: CYSTOSCOPY;  Surgeon: [name], MD;  [facility];  Service: Gynecology;  Laterality: N/A;    CYSTOSCOPY N/A 05/30/2018   Procedure: CYSTOSCOPY;  Surgeon: [name], MD;  Location: [facility];  Service: Gynecology;  Laterality: N/A;   ROBOTIC ASSISTED LAPAROSCOPIC SACROCOLPOPEXY N/A 05/14/2018   Procedure: XI ROBOTIC ASSISTED LAPAROSCOPIC SACROCOLPOPEXY;  Surgeon: [name], MD;  [facility];  Service: Gynecology;  Laterality: N/A;  6 hours OR time. Need extended stay recovery bed.   SALPINGOOPHORECTOMY Bilateral 05/14/2018   Procedure: SALPINGO OOPHORECTOMY;  Surgeon: [name], MD;  [facility];  Service: Gynecology;  Laterality: Bilateral;   THYMECTOMY  1999   TRANSVAGINAL TAPE (TVT) REMOVAL N/A 05/30/2018   Procedure: TRANSVAGINAL TAPE (TVT)  Revision;  Surgeon: [name], MD;  Location: [facility];  Service: Gynecology;  Laterality: N/A;   WISDOM TOOTH EXTRACTION  SOCIAL HISTORY: Social History   Socioeconomic History   Marital status: Married    Spouse name: Charles   Number of children: 2   Years of education: college   Highest education level: Not on file  Occupational History    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, Lab  Tobacco Use   Smoking status: Never   Smokeless tobacco: Never  Vaping Use   Vaping Use: Never used  Substance and Sexual Activity   Alcohol use: Not Currently    Alcohol/week: 0.0 standard drinks   Drug use: Never   Sexual activity: Not Currently    Partners: Male    Birth control/protection: Surgical    Comment: TAH--ovaries remain  Other Topics Concern   Not on file  Social History Narrative   Lives with husband   Caffeine- coffee, 2 cups daily   Social Determinants of Health   Financial Resource Strain: Not on file  Food Insecurity: Not on file  Transportation Needs: Not on file  Physical Activity: Not on file  Stress: Not on file  Social Connections: Not on file  Intimate Partner Violence: Not on file    FAMILY  HISTORY: Family History  Problem Relation Age of Onset   COPD Father        dec age 43/s-smoked   Macular degeneration Father    Diabetes Brother    Other Brother        committed suicide age 30   Macular degeneration Brother    Hyperlipidemia Mother    Diabetes Maternal Grandmother    Heart attack Paternal Grandfather     ALLERGIES:  is allergic to ampicillin and penicillins.  MEDICATIONS:  . Current Outpatient Medications:    Ascorbic Acid (VITAMIN C) 1000 MG tablet, Take 1,000 mg by mouth 2 (two) times daily., Disp: , Rfl:    aspirin EC 81 MG tablet, Take 81 mg by mouth daily., Disp: , Rfl:    b complex vitamins capsule, Take 1 capsule by mouth daily., Disp: , Rfl:    Cholecalciferol (VITAMIN D3) 30 MCG/15ML LIQD, Take by mouth. Pt. Takes 5000 IU daily, Disp: , Rfl:    EPINEPHrine 0.3 mg/0.3 mL IJ SOAJ injection, INJECT INTRAMUSCULARLLY AS DIRECTED AS NEEDED FOR ANAPHYLAXIS, Disp: , Rfl:    estradiol (ESTRACE) 0.1 MG/GM vaginal cream, Insert 1/2 gram vaginally twice weekly at bedtime., Disp: 45 g, Rfl: 1   lenalidomide (REVLIMID) 10 MG capsule, TAKE ONE CAPSULE BY MOUTH DAILY FOR 21 DAYS, THEN 7 DAYS OFF, Disp: 21 capsule, Rfl: 0   Magnesium 400 MG CAPS, Take by mouth., Disp: , Rfl:    LABORATORY DATA:  I have reviewed the data as listed  . CBC Latest Ref Rng & Units 04/08/2021 12/10/2020 09/10/2020  WBC 4.0 - 10.5 K/uL 4.4 5.5 4.6  Hemoglobin 12.0 - 15.0 g/dL 12.7 13.4 13.4  Hematocrit 36.0 - 46.0 % 38.2 40.2 40.5  Platelets 150 - 400 K/uL 229 287 292    . CMP Latest Ref Rng & Units 04/08/2021 12/10/2020 09/10/2020  Glucose 70 - 99 mg/dL 138(H) 99 99  BUN 8 - 23 mg/dL _0 Creatinine 0.44 - 1.00 mg/dL 0.81 0.90 0.87  Sodium 135 - 145 mmol/L 138 139 137  Potassium 3.5 - 5.1 mmol/L 3.8 4.0 4.2  Chloride 98 - 111 mmol/L 104 106 102  CO2 22 - 32 mmol/L _1 Calcium 8.9 - 10.3 mg/dL 9.3 9.3 10.1  Total Protein 6.5 - 8.1 g/dL 7.4 7.8 8.1  Total Bilirubin 0.3 -  1.2 mg/dL 0.8 1.3(H) 0.9  Alkaline Phos 38 - 126 U/L 79 85 86  AST 15 - 41 U/L _0 ALT 0 - 44 U/L _1 10/10/2019 Flow Cytometry (Repeat BM Bx from [facility]):   10/07/18 Repeat BM Bx from [facility]:    RADIOGRAPHIC STUDIES: I have personally reviewed the radiological images as listed and agreed with the findings in the report. No results found.  ASSESSMENT & PLAN:   65 y.o. caucasian female with  1. H/o Multiple myeloma - currently in remission.  IgA lambda R-ISS stage II multiple myeloma with innumerable lytic lesions in the calvarium, lesion in T7 vertebra and multiple lesions in the pelvis.  No significant bone pain at this time. Diagnosed in January 2017.  s/p treatment as noted above 10/07/18 BM Bx and Flow cytometry indicate that the pt continues to be in CR 10/10/2019 Flow Cytometry revealed "A. Bone Marrow (Plasma Cell Myeloma Minimal Residual Disease Detection by Flow Cytometry): Negative. No phenotypically abnormal plasma cells at or above the limit of detection identified."  2.Pre-op assessment for myeloma Held Revlimid from 04/30/18 through her 05/14/18 uterine lift procedure and the bunionectomy on 06/25/18 to decrease risk of clots  PLAN:  -Discussed pt labwork today, 04/08/2021; counts and chemistries normal. Myeloma panel - no M spike noted. -Recommended pt continue to take precautions when outside in the sun. -The pt has no prohibitive toxicities from continuing maintenance Revlimid at this time. -Continue Vitamin D 5000 IU 5x per week and 10000 IU 2x per week  -Continue ASA for VTE prophylaxis -Will see back in 3 months with labs.   FOLLOW UP:  RTC with Dr Irene Limbo with labs in 3 months   The total time spent in the appointment was 20 minutes and more than 50% was on counseling and direct patient cares.   All of the patient's questions were answered with apparent satisfaction. The patient knows to call the clinic with any problems, questions or concerns.    Sullivan Lone MD [place] AAHIVMS [facility] Hematology/Oncology Physician [facility]  (Office):       [phone] (Work cell):  [phone] (Fax):           [phone]   I, Reinaldo Raddle, am acting as scribe for Dr. Sullivan Lone, MD.    .I have reviewed the above documentation for accuracy and completeness, and I agree with the above. Brunetta Genera MD

## 2021-04-08 ENCOUNTER — Inpatient Hospital Stay (HOSPITAL_BASED_OUTPATIENT_CLINIC_OR_DEPARTMENT_OTHER): Payer: Medicare Other | Admitting: Hematology

## 2021-04-08 ENCOUNTER — Other Ambulatory Visit: Payer: Self-pay

## 2021-04-08 ENCOUNTER — Inpatient Hospital Stay: Payer: Medicare Other | Attending: Hematology

## 2021-04-08 VITALS — BP 118/65 | HR 89 | Temp 99.1°F | Resp 18 | Ht 67.0 in | Wt 138.7 lb

## 2021-04-08 DIAGNOSIS — C9000 Multiple myeloma not having achieved remission: Secondary | ICD-10-CM | POA: Insufficient documentation

## 2021-04-08 DIAGNOSIS — Z79899 Other long term (current) drug therapy: Secondary | ICD-10-CM | POA: Diagnosis not present

## 2021-04-08 DIAGNOSIS — C9001 Multiple myeloma in remission: Secondary | ICD-10-CM | POA: Diagnosis not present

## 2021-04-08 LAB — CMP (CANCER CENTER ONLY)
ALT: 24 U/L (ref 0–44)
AST: 18 U/L (ref 15–41)
Albumin: 3.7 g/dL (ref 3.5–5.0)
Alkaline Phosphatase: 79 U/L (ref 38–126)
Anion gap: 7 (ref 5–15)
BUN: 16 mg/dL (ref 8–23)
CO2: 27 mmol/L (ref 22–32)
Calcium: 9.3 mg/dL (ref 8.9–10.3)
Chloride: 104 mmol/L (ref 98–111)
Creatinine: 0.81 mg/dL (ref 0.44–1.00)
GFR, Estimated: >60 mL/min (ref >60)
Glucose, Bld: 138 mg/dL — ABNORMAL HIGH (ref 70–99)
Potassium: 3.8 mmol/L (ref 3.5–5.1)
Sodium: 138 mmol/L (ref 135–145)
Total Bilirubin: 0.8 mg/dL (ref 0.3–1.2)
Total Protein: 7.4 g/dL (ref 6.5–8.1)

## 2021-04-08 LAB — CBC WITH DIFFERENTIAL/PLATELET
Abs Immature Granulocytes: 0.02 10*3/uL (ref 0.00–0.07)
Basophils Absolute: 0 10*3/uL (ref 0.0–0.1)
Basophils Relative: 1 %
Eosinophils Absolute: 0.1 10*3/uL (ref 0.0–0.5)
Eosinophils Relative: 3 %
HCT: 38.2 % (ref 36.0–46.0)
Hemoglobin: 12.7 g/dL (ref 12.0–15.0)
Immature Granulocytes: 1 %
Lymphocytes Relative: 16 %
Lymphs Abs: 0.7 10*3/uL (ref 0.7–4.0)
MCH: 31.3 pg (ref 26.0–34.0)
MCHC: 33.2 g/dL (ref 30.0–36.0)
MCV: 94.1 fL (ref 80.0–100.0)
Monocytes Absolute: 0.5 10*3/uL (ref 0.1–1.0)
Monocytes Relative: 11 %
Neutro Abs: 3 10*3/uL (ref 1.7–7.7)
Neutrophils Relative %: 68 %
Platelets: 229 10*3/uL (ref 150–400)
RBC: 4.06 MIL/uL (ref 3.87–5.11)
RDW: 14.8 % (ref 11.5–15.5)
WBC: 4.4 10*3/uL (ref 4.0–10.5)
nRBC: 0 % (ref 0.0–0.2)

## 2021-04-11 ENCOUNTER — Other Ambulatory Visit: Payer: Self-pay

## 2021-04-11 ENCOUNTER — Telehealth: Payer: Self-pay | Admitting: Hematology

## 2021-04-11 DIAGNOSIS — C9001 Multiple myeloma in remission: Secondary | ICD-10-CM

## 2021-04-11 MED ORDER — LENALIDOMIDE 10 MG PO CAPS: ORAL_CAPSULE | ORAL | 0 refills | Status: DC

## 2021-04-11 NOTE — Telephone Encounter (Signed)
Scheduled follow-up appointment per 7/15 los. Patient is aware.

## 2021-04-13 LAB — MULTIPLE MYELOMA PANEL, SERUM
Albumin SerPl Elph-Mcnc: 3.7 g/dL (ref 2.9–4.4)
Albumin/Glob SerPl: 1.2 (ref 0.7–1.7)
Alpha 1: 0.2 g/dL (ref 0.0–0.4)
Alpha2 Glob SerPl Elph-Mcnc: 0.6 g/dL (ref 0.4–1.0)
B-Globulin SerPl Elph-Mcnc: 1.3 g/dL (ref 0.7–1.3)
Gamma Glob SerPl Elph-Mcnc: 1 g/dL (ref 0.4–1.8)
Globulin, Total: 3.1 g/dL (ref 2.2–3.9)
IgA: 524 mg/dL — ABNORMAL HIGH (ref 87–352)
IgG (Immunoglobin G), Serum: 1116 mg/dL (ref 586–1602)
IgM (Immunoglobulin M), Srm: 48 mg/dL (ref 26–217)
Total Protein ELP: 6.8 g/dL (ref 6.0–8.5)

## 2021-04-14 ENCOUNTER — Encounter: Payer: Self-pay | Admitting: Hematology

## 2021-05-06 ENCOUNTER — Telehealth: Payer: Self-pay

## 2021-05-06 DIAGNOSIS — C9001 Multiple myeloma in remission: Secondary | ICD-10-CM

## 2021-05-06 MED ORDER — LENALIDOMIDE 10 MG PO CAPS: ORAL_CAPSULE | ORAL | 0 refills | Status: DC

## 2021-05-06 NOTE — Telephone Encounter (Signed)
Anessa w/[facility] called requesting the auth number for pts Revlimid.   I have obtained a new [employer] auth [id number] and sent the rx with this information.

## 2021-05-12 ENCOUNTER — Other Ambulatory Visit: Payer: Self-pay

## 2021-05-12 DIAGNOSIS — C9001 Multiple myeloma in remission: Secondary | ICD-10-CM

## 2021-05-12 MED ORDER — LENALIDOMIDE 10 MG PO CAPS: ORAL_CAPSULE | ORAL | 0 refills | Status: DC

## 2021-05-17 ENCOUNTER — Encounter: Payer: Self-pay | Admitting: Dermatology

## 2021-05-17 ENCOUNTER — Other Ambulatory Visit: Payer: Self-pay

## 2021-05-17 ENCOUNTER — Ambulatory Visit (INDEPENDENT_AMBULATORY_CARE_PROVIDER_SITE_OTHER): Payer: Medicare Other | Admitting: Dermatology

## 2021-05-17 DIAGNOSIS — D1800 Hemangioma unspecified site: Secondary | ICD-10-CM

## 2021-05-17 DIAGNOSIS — D2372 Other benign neoplasm of skin of left lower limb, including hip: Secondary | ICD-10-CM

## 2021-05-17 DIAGNOSIS — D239 Other benign neoplasm of skin, unspecified: Secondary | ICD-10-CM

## 2021-05-17 DIAGNOSIS — D2271 Melanocytic nevi of right lower limb, including hip: Secondary | ICD-10-CM

## 2021-05-17 DIAGNOSIS — D229 Melanocytic nevi, unspecified: Secondary | ICD-10-CM

## 2021-05-17 DIAGNOSIS — Z1283 Encounter for screening for malignant neoplasm of skin: Secondary | ICD-10-CM

## 2021-05-28 ENCOUNTER — Encounter: Payer: Self-pay | Admitting: Dermatology

## 2021-05-28 NOTE — Progress Notes (Signed)
   Follow-Up Visit   Subjective  Paula Johnson is a 65 y.o. female who presents for the following: Skin Problem (Right thigh- "dark" mole that needs to be checked per obgyn).  General skin examination, check lesion on right thigh. Location:  Duration:  Quality:  Associated Signs/Symptoms: Modifying Factors:  Severity:  Timing: Context:   Objective  Well appearing patient in no apparent distress; mood and affect are within normal limits. Right Thigh - Anterior 4 Millimeter dark brown monochrome symmetric macule without known historical change.  Dermoscopy shows no atypical features.  Left Lower Back Multiple 2 to 3 mm smooth red dermal papules  Torso - Posterior (Back) Sun exposed areas plus back and legs examined: No atypical pigmented lesions.  No nonmelanoma skin cancers.    All sun exposed areas plus back examined.   Assessment & Plan    Dermatofibroma (2) Left Ankle - Anterior; Right Ankle - Anterior  Nevus Right Thigh - Anterior  I offered to obtain confirmatory biopsy but patient expressed preference to leave the spot unless there is clinical change and this seems most reasonable.  Hemangioma, unspecified site Left Lower Back  No intervention necessary  Encounter for screening for malignant neoplasm of skin Torso - Posterior (Back)  Annual skin examination.  Patient encouraged to examine her own skin twice annually.      I, Lavonna Monarch, MD, have reviewed all documentation for this visit.  The documentation on 05/28/21 for the exam, diagnosis, procedures, and orders are all accurate and complete.

## 2021-05-31 NOTE — Progress Notes (Signed)
GYNECOLOGY  VISIT   HPI: 65 y.o.   Married  Caucasian  female   G2P2002 with Patient's last menstrual period was 09/26/1995 (within months).   here for bright red bleeding off & on since colposcopy 03-14-21. Notices this after having bowel movements. No pain or discomfort.  No vaginal discharge.   Pap 02/24/21 showed LGSIL.  Vaginal atrophy noted.  Biopsies koilocytic atypia.   I recommended she increase her vaginal estrogen to 1/2 gram three times a week at that visit.  Not sexually active since colposcopy.   No new medications.  Continues on ASA 81 mg daily since prior to the colposcopy.   On Revlimid for 5 years.  CBC 04/08/21:  WBC 4.4, Hgb 12.7, platelets 229,000.   GYNECOLOGIC HISTORY: Patient's last menstrual period was 09/26/1995 (within months). Contraception:  Hyst Menopausal hormone therapy:  none Last mammogram:  Last pap smear:  02-24-21 LGSIL, 03-22-17 ASCUS:Neg HR HPV        OB History     Gravida  2   Para  2   Term  2   Preterm      AB      Living  2      SAB      IAB      Ectopic      Multiple      Live Births                 Patient Active Problem List   Diagnosis Date Noted   Status post surgery 05/14/2018   Bunion 05/13/2018   Cystocele, unspecified (CODE) 03/06/2018   Vaginal granulation tissue 12/03/2017   Urinary tract infection without hematuria    Muscular fasciculation    Other fatigue    Abdominal pain    FTT (failure to thrive) in adult 08/09/2017   Counseling regarding advanced care planning and goals of care 08/03/2017   Gastroesophageal reflux disease without esophagitis 01/17/2017   S/P autologous bone marrow transplantation ([place]) 03/29/2016   Near syncope 01/04/2016   Peripheral edema 12/19/2015   Hematuria 11/08/2015   Rash 11/01/2015   Multiple myeloma ([place]) 10/19/2015   Metastatic multiple myeloma to bone ([place]) 10/19/2015   Plasma cell dyscrasia 10/05/2015   Anemia 09/29/2015   Hypergammaglobulinemia  09/29/2015   HYPOXEMIA 12/16/2007   Dyslipidemia 12/04/2007   DYSPNEA 12/04/2007   MALIGNANT NEOPLASM OF THYMUS 12/03/2007   OBSTRUCTIVE SLEEP APNEA 12/03/2007   Restless leg syndrome 12/03/2007    Past Medical History:  Diagnosis Date   Abdominal pain    Abnormal Pap smear of vagina 03/22/2017   ASCUS pap and negative HR HPV.  Patient is on immunosuppressive medication.    Anemia    AR (aortic regurgitation) 11/30/2017   trace noted on ECHO   Atrial septal aneurysm per 11-30-17 echo   trivial pericardial effusion   Bunion 05/13/2018   [place]   squamous cell carcinoma of thymus   Counseling regarding advanced care planning and goals of care 08/03/2017   Cystocele, unspecified (CODE) 03/06/2018   DDD (degenerative disc disease), cervical    DDD (degenerative disc disease), lumbosacral    DYSPNEA 12/04/2007   Qualifier: Diagnosis of  By: Gwenette Greet MD, Armando Reichert    Facet degeneration of lumbar region    Facet joint disease of cervical region    Fatigue    Fatty liver    Fibroid    reason for Hysterectomy   FTT (failure to thrive) in adult 08/09/2017   Gastroesophageal  reflux disease without esophagitis 01/17/2017   GERD (gastroesophageal reflux disease)    Grade I diastolic dysfunction    Hematuria 11/08/2015   History of COVID-19 12/2020   History of cystocele    History of stress incontinence    Hypergammaglobulinemia    Hyperlipidemia    HYPERLIPIDEMIA 12/04/2007   Qualifier: Diagnosis of  By: Gwenette Greet MD, Armando Reichert    Hypoxemia 12/16/2007   Qualifier: Diagnosis of  By: Gwenette Greet MD, Armando Reichert    Lung nodule    MALIGNANT NEOPLASM OF THYMUS 12/03/2007   Annotation: resection Qualifier: History of  By: Doy Mince LPN, Megan     Metastatic multiple myeloma to bone ([place]) 10/19/2015   Migraine    episode of aphasia worked up with echo by [facility]ology 11-23-17, has visual migraines   MR (mitral regurgitation) 11/30/2017   trace noted on ECHO   Multiple myeloma ([place]) 08/2015    metastatic to bone   Muscular fasciculation    Nasal septal deviation    Nasal turbinate hypertrophy    Near syncope    OBSTRUCTIVE SLEEP APNEA 12/03/2007   Qualifier: Diagnosis of  By: Doy Mince LPN, Megan     Other fatigue    Peripheral edema    Plasma cell dyscrasia    PONV (postoperative nausea and vomiting)    thinks morphine caused nausea   Postoperative urinary retention 05/14/2018   Rash 11/01/2015   Restless leg syndrome    S/P autologous bone marrow transplantation ([place]) 03/29/2016   Sleep apnea    uses old oral allialnce for osa due to cpap intolerance   Status post surgery 05/14/2018   TR (tricuspid regurgitation) 11/30/2017   trace noted on ECHO   Urinary incontinence    Urinary tract infection without hematuria    UTI (urinary tract infection)    Vaginal granulation tissue 12/03/2017   Wears glasses     Past Surgical History:  Procedure Laterality Date   ABDOMINAL HYSTERECTOMY  1997   TAH--ovaries remain--Dr. Green N/A 05/14/2018   Procedure: ANTERIOR (CYSTOCELE) AND POSTERIOR REPAIR (RECTOCELE);  Surgeon: [name], MD;  [facility];  Service: Gynecology;  Laterality: N/A;   BLADDER SUSPENSION N/A 05/14/2018   Procedure: TRANSVAGINAL TAPE (TVT) PROCEDURE exact midurethral sling;  Surgeon: [name], MD;  [facility];  Service: Gynecology;  Laterality: N/A;   BONE MARROW TRANSPLANT  2017   done at [place] N/A 05/14/2018   Procedure: CYSTOSCOPY;  Surgeon: [name], MD;  [facility];  Service: Gynecology;  Laterality: N/A;   CYSTOSCOPY N/A 05/30/2018   Procedure: CYSTOSCOPY;  Surgeon: [name], MD;  Location: [facility];  Service: Gynecology;  Laterality: N/A;   ROBOTIC ASSISTED LAPAROSCOPIC SACROCOLPOPEXY N/A 05/14/2018   Procedure: XI ROBOTIC ASSISTED LAPAROSCOPIC SACROCOLPOPEXY;  Surgeon:  [name], MD;  [facility];  Service: Gynecology;  Laterality: N/A;  6 hours OR time. Need extended stay recovery bed.   SALPINGOOPHORECTOMY Bilateral 05/14/2018   Procedure: SALPINGO OOPHORECTOMY;  Surgeon: [name], MD;  [facility];  Service: Gynecology;  Laterality: Bilateral;   THYMECTOMY  1999   TRANSVAGINAL TAPE (TVT) REMOVAL N/A 05/30/2018   Procedure: TRANSVAGINAL TAPE (TVT)  Revision;  Surgeon: [name], MD;  Location: [facility];  Service: Gynecology;  Laterality: N/A;   WISDOM TOOTH EXTRACTION      Current Outpatient Medications  Medication Sig Dispense Refill   Ascorbic Acid (VITAMIN C) 1000 MG tablet Take 1,000 mg by mouth 2 (two) times daily.     aspirin EC 81 MG tablet Take 81 mg by mouth daily.     b complex vitamins capsule Take 1 capsule by mouth daily.     Cholecalciferol (VITAMIN D3) 30 MCG/15ML LIQD Take by mouth. Pt. Takes 5000 IU daily     EPINEPHrine 0.3 mg/0.3 mL IJ SOAJ injection INJECT INTRAMUSCULARLLY AS DIRECTED AS NEEDED FOR ANAPHYLAXIS     estradiol (ESTRACE) 0.1 MG/GM vaginal cream Insert 1/2 gram vaginally twice weekly at bedtime. 45 g 1   lenalidomide (REVLIMID) 10 MG capsule TAKE ONE CAPSULE BY MOUTH DAILY FOR 21 DAYS, THEN 7 DAYS OFF 21 capsule 0   Magnesium 400 MG CAPS Take by mouth.     No current facility-administered medications for this visit.     ALLERGIES: Ampicillin and Penicillins  Family History  Problem Relation Age of Onset   COPD Father        dec age 13/s-smoked   Macular degeneration Father    Diabetes Brother    Other Brother        committed suicide age 25   Macular degeneration Brother    Hyperlipidemia Mother    Diabetes Maternal Grandmother    Heart attack Paternal Grandfather     Social History   Socioeconomic History   Marital status: Married    Spouse name: Charles   Number of children: 2   Years of education: college   Highest education  level: Not on file  Occupational History    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, Lab  Tobacco Use   Smoking status: Never   Smokeless tobacco: Never  Vaping Use   Vaping Use: Never used  Substance and Sexual Activity   Alcohol use: Not Currently    Alcohol/week: 0.0 standard drinks   Drug use: Never   Sexual activity: Not Currently    Partners: Male    Birth control/protection: Surgical    Comment: TAH--ovaries remain  Other Topics Concern   Not on file  Social History Narrative   Lives with husband   Caffeine- coffee, 2 cups daily   Social Determinants of Health   Financial Resource Strain: Not on file  Food Insecurity: Not on file  Transportation Needs: Not on file  Physical Activity: Not on file  Stress: Not on file  Social Connections: Not on file  Intimate Partner Violence: Not on file    Review of Systems  Genitourinary:  Positive for vaginal bleeding.  All other systems reviewed and are negative.  PHYSICAL EXAMINATION:    BP 118/68   Pulse 85   Ht 5' 7.5" (1.715 m)   Wt 136 lb (61.7 kg)   LMP 09/26/1995 (Within Months)   SpO2 97%   BMI 20.99 kg/m     General appearance: alert, cooperative and appears stated age   Pelvic: External genitalia:  no lesions              Urethra:  normal appearing urethra with no masses, tenderness or lesions              Bartholins and Skenes: normal                 Vagina: normal appearing vagina with normal color and discharge, no lesions  Cervix: absent.  Vaginal lesion consistent with granulation tissue at left mid vaginal apex.  Left vaginal apical wall with ulceration. Right vaginal apical wall with ulceration.                 Bimanual Exam:  Uterus:  absent              Adnexa: no mass, fullness, tenderness              Rectal exam: yes.  Confirms.              Anus:  normal sphincter tone, no lesions  Chaperone was present for exam:  Estill Bamberg, CMA  ASSESSMENT  Vaginal bleeding.  Vaginal lesion.   Looks like granulation tissue.  Vaginal atrophy.  Vaginal biopsies showing atypia. On Revlimid.  PLAN  Use vaginal estrogen 1/2 gram per vagina at hs weekly for 2 weeks.  Do not use applicator.  Colposcopy with biopsy in 3 weeks.  Will do HR HPV testing at colposcopy visit at well.   An After Visit Summary was printed and given to the patient.  25 min  total time was spent for this patient encounter, including preparation, face-to-face counseling with the patient, coordination of care, and documentation of the encounter.

## 2021-06-01 ENCOUNTER — Encounter: Payer: Self-pay | Admitting: Obstetrics and Gynecology

## 2021-06-01 ENCOUNTER — Other Ambulatory Visit: Payer: Self-pay

## 2021-06-01 ENCOUNTER — Ambulatory Visit (INDEPENDENT_AMBULATORY_CARE_PROVIDER_SITE_OTHER): Payer: Medicare Other | Admitting: Obstetrics and Gynecology

## 2021-06-01 VITALS — BP 118/68 | HR 85 | Ht 67.5 in | Wt 136.0 lb

## 2021-06-01 DIAGNOSIS — N939 Abnormal uterine and vaginal bleeding, unspecified: Secondary | ICD-10-CM

## 2021-06-01 DIAGNOSIS — N952 Postmenopausal atrophic vaginitis: Secondary | ICD-10-CM

## 2021-06-01 DIAGNOSIS — N898 Other specified noninflammatory disorders of vagina: Secondary | ICD-10-CM

## 2021-06-08 ENCOUNTER — Other Ambulatory Visit: Payer: Self-pay

## 2021-06-08 DIAGNOSIS — C9001 Multiple myeloma in remission: Secondary | ICD-10-CM

## 2021-06-08 MED ORDER — LENALIDOMIDE 10 MG PO CAPS: ORAL_CAPSULE | ORAL | 0 refills | Status: DC

## 2021-06-21 NOTE — Progress Notes (Signed)
  Subjective:     Patient ID: Paula Johnson, female   DOB: 07-Sep-1956, 65 y.o.   MRN: [id number]  HPI Patient here today for colposcopy. Vaginal lesion consistent with granulation tissue at left mid vaginal apex.  Left vaginal apical wall with ulceration. Right vaginal apical wall with ulceration.   She used vaginal estrogen cream nightly for 2 weeks in preparation for the colposcopy.  Pap history: 03-14-21 colpo revealed koilocytic atypia 02-24-21 pap LGSIL 03-22-17 ASCUS:Neg HR HPV    Review of Systems  All other systems reviewed and are negative. [id number]     Objective:   Physical Exam Genitourinary:     Colposcopy of the vagina Consent for procedure. Acetic acid placed and microscope used to visualize the upper vagina.  Granulation tissue of the mid vaginal cuff and upper left vaginal wall. Biopsy of the midline vaginal cuff granulation tissue and the left vaginal wall, each to pathology separately.  Areas treated with silver nitrate.  Minimal EBL. No complications.     Assessment:     Vaginal lesions consistent with granulation tissue.     Plan:     FU biopsies.  Resume use of vaginal estrogen 1/2 gram pv at hs three times per week after all vaginal discharge and bleeding stops.  Plan for pap June, 2023.

## 2021-06-22 ENCOUNTER — Encounter: Payer: Self-pay | Admitting: Obstetrics and Gynecology

## 2021-06-22 ENCOUNTER — Ambulatory Visit (INDEPENDENT_AMBULATORY_CARE_PROVIDER_SITE_OTHER): Payer: Medicare Other | Admitting: Obstetrics and Gynecology

## 2021-06-22 ENCOUNTER — Other Ambulatory Visit: Payer: Self-pay

## 2021-06-22 ENCOUNTER — Other Ambulatory Visit (HOSPITAL_COMMUNITY)
Admission: RE | Admit: 2021-06-22 | Discharge: 2021-06-22 | Disposition: A | Discharge status: Home / Self Care | Payer: Medicare Other | Source: Ambulatory Visit | Attending: Obstetrics and Gynecology | Admitting: Obstetrics and Gynecology

## 2021-06-22 VITALS — BP 112/64 | HR 72 | Ht 67.5 in | Wt 136.0 lb

## 2021-06-22 DIAGNOSIS — N898 Other specified noninflammatory disorders of vagina: Secondary | ICD-10-CM | POA: Diagnosis present

## 2021-06-22 DIAGNOSIS — N939 Abnormal uterine and vaginal bleeding, unspecified: Secondary | ICD-10-CM

## 2021-06-22 NOTE — Patient Instructions (Signed)
Colposcopy, Care After This sheet gives you information about how to care for yourself after your procedure. Your doctor may also give you more specific instructions. If youhave problems or questions, contact your doctor. What can I expect after the procedure? If you did not have a sample of your tissue taken out (did not have a biopsy), you may only have some spotting of blood for a few days. You can go back toyour normal activities. If you had a sample of your tissue taken out, it is common to have: Soreness and mild pain. These may last for a few days. A light-headed feeling. Mild bleeding or fluid (discharge) coming from your vagina. The fluid will look dark and grainy. You may have this for a few days. The fluid may be caused by a liquid that was used during your procedure. You may need to wear a sanitary pad. Spotting of blood for at least 48 hours after the procedure. Follow these instructions at home: Medicines Take over-the-counter and prescription medicines only as told by your doctor. Ask your doctor what medicines you can start taking again. This is very important if you take blood thinners. Activity Limit your activity for the first day after your procedure as told by your doctor. For at least 3 days, or for as long as told by your doctor, avoid: Douching. Using tampons. Having sex. Return to your normal activities as told by your doctor. Ask your doctor what activities are safe for you. General instructions  Drink enough fluid to keep your pee (urine) pale yellow. Ask your doctor if you may take baths, swim, or use a hot tub. You may take showers. If you use birth control (contraception), keep using it. Keep all follow-up visits as told by your doctor. This is important.  Contact a doctor if: You get a skin rash. Get help right away if: You bleed a lot from your vagina. A lot of bleeding means you use more than one pad an hour for 2 hours in a row. You have clumps of  blood (blood clots) coming from your vagina. You have a fever or chills. You have signs of infection. This may be fluid coming from your vagina that is: Different than normal. Yellow. Bad-smelling. You have very bad pain or cramps in your lower belly that do not get better with medicine. You faint. Summary If you did not have a sample of your tissue taken out, you may only have some spotting of blood for a few days. You can go back to your normal activities. If you had a sample of your tissue taken out, it is common to have mild pain for a few days and spotting for 48 hours. Avoid douching, using tampons, and having sex for at least 3 days after the procedure or for as long as told. Get help right away if you have a lot of bleeding, very bad pain, or signs of infection. This information is not intended to replace advice given to you by your health care provider. Make sure you discuss any questions you have with your healthcare provider. Document Revised: 07/13/2020 Document Reviewed: 09/10/2019 Elsevier Patient Education  2022 [employer]  

## 2021-06-23 ENCOUNTER — Encounter: Payer: Self-pay | Admitting: Physician Assistant

## 2021-06-23 LAB — SURGICAL PATHOLOGY

## 2021-07-01 ENCOUNTER — Other Ambulatory Visit: Payer: Self-pay

## 2021-07-01 DIAGNOSIS — C9001 Multiple myeloma in remission: Secondary | ICD-10-CM

## 2021-07-01 MED ORDER — LENALIDOMIDE 10 MG PO CAPS: ORAL_CAPSULE | ORAL | 0 refills | Status: DC

## 2021-07-06 ENCOUNTER — Ambulatory Visit (INDEPENDENT_AMBULATORY_CARE_PROVIDER_SITE_OTHER): Payer: Medicare Other | Admitting: Physician Assistant

## 2021-07-06 ENCOUNTER — Other Ambulatory Visit: Payer: Self-pay

## 2021-07-06 ENCOUNTER — Other Ambulatory Visit (INDEPENDENT_AMBULATORY_CARE_PROVIDER_SITE_OTHER): Payer: Medicare Other

## 2021-07-06 ENCOUNTER — Encounter: Payer: Self-pay | Admitting: Physician Assistant

## 2021-07-06 VITALS — BP 141/81 | HR 63 | Resp 18 | Ht 68.0 in | Wt 139.0 lb

## 2021-07-06 DIAGNOSIS — R413 Other amnesia: Secondary | ICD-10-CM | POA: Diagnosis not present

## 2021-07-06 DIAGNOSIS — C9000 Multiple myeloma not having achieved remission: Secondary | ICD-10-CM

## 2021-07-06 LAB — TSH: TSH: 2.9 u[IU]/mL (ref 0.35–5.50)

## 2021-07-06 LAB — VITAMIN B12: Vitamin B-12: 216 pg/mL (ref 211–911)

## 2021-07-06 NOTE — Progress Notes (Addendum)
Assessment/Plan:   Paula Johnson is a 65 y.o. female with a history of multiple myeloma diagnosed in 2017 with multiple lytic lesions in the calvarium, T7 vertebrae and multiple pelvic lesions, s/p BM transplant, currently on Revlimid, COVID 01/22/2021, hyperlipidemia, remote history of thymus neoplasm, history of visual migraine without headaches, obstructive sleep apnea with CPAP intolerance, restless leg syndrome, seen today in follow up for cognitive concerns.  Her MoCA today is 30/30.  Recommendations MRI brain with/without contrast to assess for underlying structural abnormality and assess vascular load  Check B12, TSH Neurocognitive testingo further evaluate cognitive concerns and determine underlying cause of memory changes, including potential contribution from sleep, anxiety, or depression  Discussed safety both in and out of the home.  Discussed the importance of regular daily schedule with inclusion of crossword puzzles to maintain brain function.  Continue to monitor mood with PCP.  Stay active at least 30 minutes at least 3 times a week.  Naps should be scheduled and should be no longer than 60 minutes and should not occur after 2 PM.  Mediterranean diet is recommended  Folllow up once results above are available   Subjective:   The patient is seen in neurologic consultation at the request of [name], MD for the evaluation of cognitive concerns.  She is here alone.  This is a very pleasant 65 year old woman who, over the last 2 years, had noticed difficulties playing her piano.  While keep on trying to play a piece, she was unable to master it, which brought significant frustration to her.  At times, she does not recognize the notes.  Lately, when she walks into her room, she forgets what she was supposed to be doing there.  While on the Internet, she cannot remember what she was supposed to search for.  She describes it as being unfocused, and has been unable to recall  where she places objects.  Unsure whether it is "the medication that I take for multiple myeloma or anything is going on ", she is seen today for further evaluation.  She lives with her husband, who has not noticed these changes.  She denies any depression.  "Mostly is irritability, frustration with myself ".  She falls asleep easily at night, but has trouble staying asleep.   She denies vivid dreams or sleepwalking.  No hallucinations or paranoia reported.  She is independent of bathing and dressing.  She denies missing any medications, the finances are taking care of by her husband.  Her appetite is good, denies trouble swallowing, she cooks and denies leaving the stove or the faucet on.  She ambulates without difficulty without the use of a walker or a cane, denies any falls or head injuries.  She drives, denies getting lost.  She has a history of migraines, with aura, "about 3 over the last 3 years ", lasting about 1 hour, and then resolving.  She does not take anything for it.  She denies any current headaches, vision changes, dizziness, focal numbness or tingling.  She has bilateral neuropathy in her feet, but denies any unilateral weakness or tremors.  She denies any urine incontinence or retention.  Denies constipation or diarrhea.  Denies anosmia.  She has a history of sleep apnea, was unable to tolerate CPAP, but she is using a mask design for her to breathe better, followed up at [facility].   Denies history of alcohol, or tobacco.  Family history remarkable for father with dementia.  CBC and CMP on 04/08/2021 normal  MRI and MRA of the brain 11/13/2017 without contrast Normal appearance of the brain itself.  No old or acute infarction.  Marrow changes consistent with the patient's known myeloma.  No anterior circulation vascular pathology in the neck or head. Fetal origin of both posterior cerebral arteries from the anteriorcirculation.  Diminutive posterior circulation. Right vertebral artery  supplies the basilar and its branches. Left vertebral artery is a small vessel which does show patency to left PICA. 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 I think there is stenosis at the left vertebral artery origin and possibly in the cervical course. This would not seem to relate to the current presentation.  Allergies  Allergen Reactions   Ampicillin Rash and Other (See Comments)   Penicillins Rash    Has patient had a PCN reaction causing immediate rash, facial/tongue/throat swelling, SOB or lightheadedness with hypotension: Y Has patient had a PCN reaction causing severe rash involving mucus membranes or skin necrosis: Y Has patient had a PCN reaction that required hospitalization: N Has patient had a PCN reaction occurring within the last 10 years: N If all of the above answers are "NO", then may proceed with Cephalosporin use.     Current Outpatient Medications  Medication Instructions   aspirin EC 81 mg, Oral, Daily   b complex vitamins capsule 1 capsule, Oral, Daily   Cholecalciferol (VITAMIN D3) 30 MCG/15ML LIQD Oral, Pt. Takes 5000 IU daily   EPINEPHrine 0.3 mg/0.3 mL IJ SOAJ injection INJECT INTRAMUSCULARLLY AS DIRECTED AS NEEDED FOR ANAPHYLAXIS   estradiol (ESTRACE) 0.1 MG/GM vaginal cream Insert 1/2 gram vaginally twice weekly at bedtime.   lenalidomide (REVLIMID) 10 MG capsule TAKE ONE CAPSULE BY MOUTH DAILY FOR 21 DAYS, THEN 7 DAYS OFF   Magnesium 400 MG CAPS Oral   vitamin C 1,000 mg, Oral, 2 times daily     VITALS:   Vitals:   07/06/21 0754  BP: (!) 141/81  Pulse: 63  Resp: 18  SpO2: 99%  Weight: 139 lb (63 kg)  Height: _0  (1.727 m)   No flowsheet data found.  PHYSICAL EXAM   HEENT:  Normocephalic, atraumatic. The mucous membranes are moist. The superficial temporal arteries are without ropiness or tenderness. Cardiovascular: Regular rate and rhythm. Lungs: Clear to auscultation bilaterally. Neck: There are no  carotid bruits noted bilaterally.  NEUROLOGICAL: No flowsheet data found. No flowsheet data found.  No flowsheet data found.   Orientation:  Alert and oriented to person, place and time. No aphasia or dysarthria. Fund of knowledge is appropriate. Recent memory and remote memory intact.  Attention and concentration are normal.  Able to name objects and repeat phrases. Delayed recall  5/5 Cranial nerves: There is good facial symmetry. Extraocular muscles are intact and visual fields are full to confrontational testing. Speech is fluent and clear. Soft palate rises symmetrically and there is no tongue deviation. Hearing is intact to conversational tone. Tone: Tone is good throughout. Sensation: Sensation is intact to light touch and pinprick throughout. Vibration is intact at the bilateral big toe.There is no extinction with double simultaneous stimulation. There is no sensory dermatomal level identified. Coordination: The patient has no difficulty with RAM's or FNF bilaterally. Normal finger to nose  Motor: Strength is 5/5 in the bilateral upper and lower extremities. There is no pronator drift. There are no fasciculations noted. DTR's: Deep tendon reflexes are 2/4 at the bilateral biceps, triceps, brachioradialis, patella and achilles.  Plantar responses are downgoing bilaterally. Gait and Station: The patient is able to ambulate without difficulty.The patient is able to heel toe walk without any difficulty.The patient is able to ambulate in a tandem fashion. The patient is able to stand in the Romberg position.     Thank you for allowing [place] the opportunity to participate in the care of this nice patient. Please do not hesitate to contact us for any questions or concerns.   Total time spent on today's visit was 51 minutes, including both face-to-face time and nonface-to-face time.  Time included that spent on review of records (prior notes available to me/labs/imaging if pertinent), discussing  treatment and goals, answering patient's questions and coordinating care.  Cc:  [name], MD  Sharene Butters 07/06/2021 12:49 PM

## 2021-07-06 NOTE — Patient Instructions (Signed)
It was a pleasure to see you today at our office.   Recommendations:  Neurocognitive evaluation at our office MRI of the brain, the radiology office will call you to arrange you appointment Check labs today Follow up after neurocognitive testing  RECOMMENDATIONS FOR ALL PATIENTS WITH MEMORY PROBLEMS: 1. Continue to exercise (Recommend 30 minutes of walking everyday, or 3 hours every week) 2. Increase social interactions - continue going to [place] and enjoy social gatherings with friends and family 3. Eat healthy, avoid fried foods and eat more fruits and vegetables 4. Maintain adequate blood pressure, blood sugar, and blood cholesterol level. Reducing the risk of stroke and cardiovascular disease also helps promoting better memory. 5. Avoid stressful situations. Live a simple life and avoid aggravations. Organize your time and prepare for the next day in anticipation. 6. Sleep well, avoid any interruptions of sleep and avoid any distractions in the bedroom that may interfere with adequate sleep quality 7. Avoid sugar, avoid sweets as there is a strong link between excessive sugar intake, diabetes, and cognitive impairment We discussed the Mediterranean diet, which has been shown to help patients reduce the risk of progressive memory disorders and reduces cardiovascular risk. This includes eating fish, eat fruits and green leafy vegetables, nuts like almonds and hazelnuts, walnuts, and also use olive oil. Avoid fast foods and fried foods as much as possible. Avoid sweets and sugar as sugar use has been linked to worsening of memory function.  There is always a concern of gradual progression of memory problems. If this is the case, then we may need to adjust level of care according to patient needs. Support, both to the patient and caregiver, should then be put into place.      You have been referred for a neuropsychological evaluation (i.e., evaluation of memory and thinking abilities). Please  bring someone with you to this appointment if possible, as it is helpful for the doctor to hear from both you and another adult who knows you well. Please bring eyeglasses and hearing aids if you wear them.    The evaluation will take approximately 3 hours and has two parts:   The first part is a clinical interview with the neuropsychologist (Dr. Melvyn Novas or Dr. Nicole Kindred). During the interview, the neuropsychologist will speak with you and the individual you brought to the appointment.    The second part of the evaluation is testing with the doctor's technician Hinton Dyer or Maudie Mercury). During the testing, the technician will ask you to remember different types of material, solve problems, and answer some questionnaires. Your family member will not be present for this portion of the evaluation.   Please note: We must reserve several hours of the neuropsychologist's time and the psychometrician's time for your evaluation appointment. As such, there is a No-Show fee of $100. If you are unable to attend any of your appointments, please contact our office as soon as possible to reschedule.    FALL PRECAUTIONS: Be cautious when walking. Scan the area for obstacles that may increase the risk of trips and falls. When getting up in the mornings, sit up at the edge of the bed for a few minutes before getting out of bed. Consider elevating the bed at the head end to avoid drop of blood pressure when getting up. Walk always in a well-lit room (use night lights in the walls). Avoid area rugs or power cords from appliances in the middle of the walkways. Use a walker or a cane if necessary and  consider physical therapy for balance exercise. Get your eyesight checked regularly.  FINANCIAL OVERSIGHT: Supervision, especially oversight when making financial decisions or transactions is also recommended.  HOME SAFETY: Consider the safety of the kitchen when operating appliances like stoves, microwave oven, and blender. Consider having  supervision and share cooking responsibilities until no longer able to participate in those. Accidents with firearms and other hazards in the house should be identified and addressed as well.   ABILITY TO BE LEFT ALONE: If patient is unable to contact 911 operator, consider using LifeLine, or when the need is there, arrange for someone to stay with patients. Smoking is a fire hazard, consider supervision or cessation. Risk of wandering should be assessed by caregiver and if detected at any point, supervision and safe proof recommendations should be instituted.  MEDICATION SUPERVISION: Inability to self-administer medication needs to be constantly addressed. Implement a mechanism to ensure safe administration of the medications.   DRIVING: Regarding driving, in patients with progressive memory problems, driving will be impaired. We advise to have someone else do the driving if trouble finding directions or if minor accidents are reported. Independent driving assessment is available to determine safety of driving.   If you are interested in the driving assessment, you can contact the following:  [employer] in [place]  [place] [phone] or [phone]    [place] refers to food and lifestyle choices that are based on the traditions of countries located on the [employer]. This way of eating has been shown to help prevent certain conditions and improve outcomes for people who have chronic diseases, like kidney disease and heart disease. What are tips for following this plan? Lifestyle  Cook and eat meals together with your family, when possible. Drink enough fluid to keep your urine clear or pale yellow. Be physically active every day. This includes: Aerobic exercise like running or swimming. Leisure activities like gardening,  walking, or housework. Get 7-8 hours of sleep each night. If recommended by your health care provider, drink red wine in moderation. This means 1 glass a day for nonpregnant women and 2 glasses a day for men. A glass of wine equals 5 oz (150 mL). Reading food labels  Check the serving size of packaged foods. For foods such as rice and pasta, the serving size refers to the amount of cooked product, not dry. Check the total fat in packaged foods. Avoid foods that have saturated fat or trans fats. Check the ingredients list for added sugars, such as corn syrup. Shopping  At the grocery store, buy most of your food from the areas near the walls of the store. This includes: Fresh fruits and vegetables (produce). Grains, beans, nuts, and seeds. Some of these may be available in unpackaged forms or large amounts (in bulk). Fresh seafood. Poultry and eggs. Low-fat dairy products. Buy whole ingredients instead of prepackaged foods. Buy fresh fruits and vegetables in-season from local farmers markets. Buy frozen fruits and vegetables in resealable bags. If you do not have access to quality fresh seafood, buy precooked frozen shrimp or canned fish, such as tuna, salmon, or sardines. Buy small amounts of raw or cooked vegetables, salads, or olives from the deli or salad bar at your store. Stock your pantry so you always have certain foods on hand, such as olive oil, canned tuna, canned tomatoes, rice, pasta, and beans. Cooking  Cook foods with extra-virgin  olive oil instead of using butter or other vegetable oils. Have meat as a side dish, and have vegetables or grains as your main dish. This means having meat in small portions or adding small amounts of meat to foods like pasta or stew. Use beans or vegetables instead of meat in common dishes like chili or lasagna. Experiment with different cooking methods. Try roasting or broiling vegetables instead of steaming or sauteing them. Add frozen vegetables  to soups, stews, pasta, or rice. Add nuts or seeds for added healthy fat at each meal. You can add these to yogurt, salads, or vegetable dishes. Marinate fish or vegetables using olive oil, lemon juice, garlic, and fresh herbs. Meal planning  Plan to eat 1 vegetarian meal one day each week. Try to work up to 2 vegetarian meals, if possible. Eat seafood 2 or more times a week. Have healthy snacks readily available, such as: Vegetable sticks with hummus. Greek yogurt. Fruit and nut trail mix. Eat balanced meals throughout the week. This includes: Fruit: 2-3 servings a day Vegetables: 4-5 servings a day Low-fat dairy: 2 servings a day Fish, poultry, or lean meat: 1 serving a day Beans and legumes: 2 or more servings a week Nuts and seeds: 1-2 servings a day Whole grains: 6-8 servings a day Extra-virgin olive oil: 3-4 servings a day Limit red meat and sweets to only a few servings a month What are my food choices? Mediterranean diet Recommended Grains: Whole-grain pasta. Brown rice. Bulgar wheat. Polenta. Couscous. Whole-wheat bread. [place]. Vegetables: Artichokes. Beets. Broccoli. Cabbage. Carrots. Eggplant. Green beans. Chard. Kale. Spinach. Onions. Leeks. Peas. Squash. Tomatoes. Peppers. Radishes. Fruits: Apples. Apricots. Avocado. Berries. Bananas. Cherries. Dates. Figs. Grapes. Lemons. Melon. Oranges. Peaches. Plums. Pomegranate. Meats and other protein foods: Beans. Almonds. Sunflower seeds. Pine nuts. Peanuts. [place]. Salmon. Scallops. Shrimp. [place]. Tilapia. Clams. Oysters. Eggs. Dairy: Low-fat milk. Cheese. Greek yogurt. Beverages: Water. Red wine. Herbal tea. Fats and oils: Extra virgin olive oil. Avocado oil. Grape seed oil. Sweets and desserts: [place] yogurt with honey. Baked apples. Poached pears. Trail mix. Seasoning and other foods: Basil. Cilantro. Coriander. Cumin. Mint. Parsley. Sage. Rosemary. Tarragon. Garlic. Oregano. Thyme. Pepper. Balsalmic vinegar. Tahini.  Hummus. Tomato sauce. Olives. Mushrooms. Limit these Grains: Prepackaged pasta or rice dishes. Prepackaged cereal with added sugar. Vegetables: Deep fried potatoes (french fries). Fruits: Fruit canned in syrup. Meats and other protein foods: Beef. Pork. Lamb. Poultry with skin. Hot dogs. [place] Salines. Dairy: Ice cream. Sour cream. Whole milk. Beverages: Juice. Sugar-sweetened soft drinks. Beer. Liquor and spirits. Fats and oils: Butter. Canola oil. Vegetable oil. Beef fat (tallow). Lard. Sweets and desserts: Cookies. Cakes. Pies. Candy. Seasoning and other foods: Mayonnaise. Premade sauces and marinades. The items listed may not be a complete list. Talk with your dietitian about what dietary choices are right for you. Summary The Mediterranean diet includes both food and lifestyle choices. Eat a variety of fresh fruits and vegetables, beans, nuts, seeds, and whole grains. Limit the amount of red meat and sweets that you eat. Talk with your health care provider about whether it is safe for you to drink red wine in moderation. This means 1 glass a day for nonpregnant women and 2 glasses a day for men. A glass of wine equals 5 oz (150 mL). This information is not intended to replace advice given to you by your health care provider. Make sure you discuss any questions you have with your health care provider. Document Released: 05/04/2016 Document Revised: 06/06/2016 Document Reviewed:  05/04/2016 Elsevier Interactive Patient Education  2017 [employer].

## 2021-07-08 ENCOUNTER — Inpatient Hospital Stay: Payer: Medicare Other | Attending: Hematology | Admitting: Hematology

## 2021-07-08 ENCOUNTER — Other Ambulatory Visit: Payer: Self-pay

## 2021-07-08 ENCOUNTER — Inpatient Hospital Stay: Payer: Medicare Other

## 2021-07-08 VITALS — BP 114/72 | HR 64 | Temp 97.9°F | Resp 16 | Ht 68.0 in | Wt 137.9 lb

## 2021-07-08 DIAGNOSIS — C9001 Multiple myeloma in remission: Secondary | ICD-10-CM | POA: Diagnosis present

## 2021-07-08 LAB — CMP (CANCER CENTER ONLY)
ALT: 25 U/L (ref 0–44)
AST: 22 U/L (ref 15–41)
Albumin: 4.1 g/dL (ref 3.5–5.0)
Alkaline Phosphatase: 75 U/L (ref 38–126)
Anion gap: 8 (ref 5–15)
BUN: 20 mg/dL (ref 8–23)
CO2: 26 mmol/L (ref 22–32)
Calcium: 9.3 mg/dL (ref 8.9–10.3)
Chloride: 102 mmol/L (ref 98–111)
Creatinine: 0.83 mg/dL (ref 0.44–1.00)
GFR, Estimated: >60 mL/min (ref >60)
Glucose, Bld: 84 mg/dL (ref 70–99)
Potassium: 4.4 mmol/L (ref 3.5–5.1)
Sodium: 136 mmol/L (ref 135–145)
Total Bilirubin: 1 mg/dL (ref 0.3–1.2)
Total Protein: 8 g/dL (ref 6.5–8.1)

## 2021-07-08 LAB — CBC WITH DIFFERENTIAL (CANCER CENTER ONLY)
Abs Immature Granulocytes: 0 10*3/uL (ref 0.00–0.07)
Basophils Absolute: 0 10*3/uL (ref 0.0–0.1)
Basophils Relative: 1 %
Eosinophils Absolute: 0.1 10*3/uL (ref 0.0–0.5)
Eosinophils Relative: 3 %
HCT: 40.3 % (ref 36.0–46.0)
Hemoglobin: 13.1 g/dL (ref 12.0–15.0)
Immature Granulocytes: 0 %
Lymphocytes Relative: 27 %
Lymphs Abs: 0.9 10*3/uL (ref 0.7–4.0)
MCH: 31 pg (ref 26.0–34.0)
MCHC: 32.5 g/dL (ref 30.0–36.0)
MCV: 95.3 fL (ref 80.0–100.0)
Monocytes Absolute: 0.4 10*3/uL (ref 0.1–1.0)
Monocytes Relative: 10 %
Neutro Abs: 2 10*3/uL (ref 1.7–7.7)
Neutrophils Relative %: 59 %
Platelet Count: 290 10*3/uL (ref 150–400)
RBC: 4.23 MIL/uL (ref 3.87–5.11)
RDW: 13.9 % (ref 11.5–15.5)
WBC Count: 3.5 10*3/uL — ABNORMAL LOW (ref 4.0–10.5)
nRBC: 0 % (ref 0.0–0.2)

## 2021-07-11 ENCOUNTER — Telehealth: Payer: Self-pay | Admitting: Hematology

## 2021-07-11 LAB — MULTIPLE MYELOMA PANEL, SERUM
Albumin SerPl Elph-Mcnc: 3.8 g/dL (ref 2.9–4.4)
Albumin/Glob SerPl: 1.1 (ref 0.7–1.7)
Alpha 1: 0.2 g/dL (ref 0.0–0.4)
Alpha2 Glob SerPl Elph-Mcnc: 0.8 g/dL (ref 0.4–1.0)
B-Globulin SerPl Elph-Mcnc: 1.2 g/dL (ref 0.7–1.3)
Gamma Glob SerPl Elph-Mcnc: 1.3 g/dL (ref 0.4–1.8)
Globulin, Total: 3.5 g/dL (ref 2.2–3.9)
IgA: 586 mg/dL — ABNORMAL HIGH (ref 87–352)
IgG (Immunoglobin G), Serum: 1202 mg/dL (ref 586–1602)
IgM (Immunoglobulin M), Srm: 52 mg/dL (ref 26–217)
Total Protein ELP: 7.3 g/dL (ref 6.0–8.5)

## 2021-07-11 NOTE — Telephone Encounter (Signed)
Scheduled follow-up appointment per 10/14 los. Patient is aware.

## 2021-07-14 ENCOUNTER — Encounter: Payer: Self-pay | Admitting: Hematology

## 2021-07-14 NOTE — Progress Notes (Addendum)
Paula Johnson  HEMATOLOGY/ONCOLOGY CLINIC NOTE  Date of Service: .07/08/2021   PCP: [name], MD  CHIEF COMPLAINT: F/u for continue mx of multiple myeloma  CHIEF COMPLAINTS/PURPOSE OF CONSULTATION:  Follow up for multiple myeloma   DIAGNOSIS   IgA lambda R-ISS stage II multiple myeloma with innumerable lytic lesions in the calvarium, lesion in T7 vertebra and multiple lesions in the pelvis.  No significant bone pain at this time. Diagnosed in January 2017.   Current treatment   Revlimid 20m po daily 3 weeks on 1 week off (after treatment interruption)  Previous Treatment   Status post Vd x 1 cycle VRd x 6 cycles   HD Melphalan 2016mm2 on 03/22/2016 Autologous HSCT [facility] 03/23/2016 [facility]   Current rx -Revlimid maintenance    INTERVAL HISTORY:   [name] here for management and evaluation of her multiple myeloma. The patient's last visit with [place] on 04/08/2021. The pt reports that she is doing well overall.  The pt reports no new symptoms or concerns. She continues to tolerate the Revlimid well with no new side effects. She tolerated the Evusheld well. The pt notes her pap smear showed abnormal PPV cells, but they are just continuing to monitor tis. She is up to date with all age related cancer screenings.  Lab results today 07/08/2021  cbc/diff wnl, cmp wnl , no M spike detected.    REVIEW OF SYSTEMS:   .10 Point review of Systems was done is negative except as noted above.   PHYSICAL EXAMINATION: .BP 114/72 (BP Location: Left Arm, Patient Position: Sitting)   Pulse 64   Temp 97.9 F (36.6 C) (Oral)   Resp 16   Ht _0  (1.727 m)   Wt 137 lb 14.4 oz (62.6 kg)   LMP 09/26/1995 (Within Months)   SpO2 100%   BMI 20.97 kg/m  . GENERAL:alert, in no acute distress and comfortable SKIN: no acute rashes, no significant lesions EYES: conjunctiva are pink and non-injected, sclera anicteric OROPHARYNX: MMM, no exudates,  no oropharyngeal erythema or ulceration NECK: supple, no JVD LYMPH:  no palpable lymphadenopathy in the cervical, axillary or inguinal regions LUNGS: clear to auscultation b/l with normal respiratory effort HEART: regular rate & rhythm ABDOMEN:  normoactive bowel sounds , non tender, not distended. Extremity: no pedal edema PSYCH: alert & oriented x 3 with fluent speech NEURO: no focal motor/sensory deficits    MEDICAL HISTORY:   Past Medical History:  Diagnosis Date   Abdominal pain    Abnormal Pap smear of vagina 03/22/2017   ASCUS pap and negative HR HPV.  Patient is on immunosuppressive medication.    Anemia    AR (aortic regurgitation) 11/30/2017   trace noted on ECHO   Atrial septal aneurysm per 11-30-17 echo   trivial pericardial effusion   Bunion 05/13/2018   Cancer ([place]   squamous cell carcinoma of thymus   Counseling regarding advanced care planning and goals of care 08/03/2017   Cystocele, unspecified (CODE) 03/06/2018   DDD (degenerative disc disease), cervical    DDD (degenerative disc disease), lumbosacral    DYSPNEA 12/04/2007   Qualifier: Diagnosis of  By: ClGwenette GreetD, KeArmando Reichert  Facet degeneration of lumbar region    Facet joint disease of cervical region    Fatigue    Fatty liver    Fibroid    reason for Hysterectomy   FTT (failure to thrive) in adult 08/09/2017  Gastroesophageal reflux disease without esophagitis 01/17/2017   GERD (gastroesophageal reflux disease)    Grade I diastolic dysfunction    Hematuria 11/08/2015   History of COVID-19 12/2020   History of cystocele    History of stress incontinence    Hypergammaglobulinemia    Hyperlipidemia    HYPERLIPIDEMIA 12/04/2007   Qualifier: Diagnosis of  By: Gwenette Greet MD, Armando Reichert    Hypoxemia 12/16/2007   Qualifier: Diagnosis of  By: Gwenette Greet MD, Armando Reichert    Lung nodule    MALIGNANT NEOPLASM OF THYMUS 12/03/2007   Annotation: resection Qualifier: History of  By: Doy Mince LPN, Megan     Metastatic multiple  myeloma to bone ([place]) 10/19/2015   Migraine    episode of aphasia worked up with echo by [place] [facility] 11-23-17, has visual migraines   MR (mitral regurgitation) 11/30/2017   trace noted on ECHO   Multiple myeloma ([place]) 08/2015   metastatic to bone   Muscular fasciculation    Nasal septal deviation    Nasal turbinate hypertrophy    Near syncope    OBSTRUCTIVE SLEEP APNEA 12/03/2007   Qualifier: Diagnosis of  By: Doy Mince LPN, Megan     Other fatigue    Peripheral edema    Plasma cell dyscrasia    PONV (postoperative nausea and vomiting)    thinks morphine caused nausea   Postoperative urinary retention 05/14/2018   Rash 11/01/2015   Restless leg syndrome    S/P autologous bone marrow transplantation ([place]) 03/29/2016   Sleep apnea    uses old oral allialnce for osa due to cpap intolerance   Status post surgery 05/14/2018   TR (tricuspid regurgitation) 11/30/2017   trace noted on ECHO   Urinary incontinence    Urinary tract infection without hematuria    UTI (urinary tract infection)    Vaginal granulation tissue 12/03/2017   Wears glasses     SURGICAL HISTORY: Past Surgical History:  Procedure Laterality Date   ABDOMINAL HYSTERECTOMY  1997   TAH--ovaries remain--Dr. Newton Pigg   ANTERIOR AND POSTERIOR REPAIR N/A 05/14/2018   Procedure: ANTERIOR (CYSTOCELE) AND POSTERIOR REPAIR (RECTOCELE);  Surgeon: [name], MD;  [facility];  Service: Gynecology;  Laterality: N/A;   BLADDER SUSPENSION N/A 05/14/2018   Procedure: TRANSVAGINAL TAPE (TVT) PROCEDURE exact midurethral sling;  Surgeon: [name], MD;  Location: [facility];  Service: Gynecology;  Laterality: N/A;   BONE MARROW TRANSPLANT  2017   done at [place] [facility] N/A 05/14/2018   Procedure: CYSTOSCOPY;  Surgeon: [name], MD;  [facility];  Service: Gynecology;  Laterality: N/A;   CYSTOSCOPY N/A 05/30/2018   Procedure: CYSTOSCOPY;   Surgeon: [name], MD;  Location[facility]  Service: Gynecology;  Laterality: N/A;   ROBOTIC ASSISTED LAPAROSCOPIC SACROCOLPOPEXY N/A 05/14/2018   Procedure: XI ROBOTIC ASSISTED LAPAROSCOPIC SACROCOLPOPEXY;  Surgeon: [name], MD;  [facility];  Service: Gynecology;  Laterality: N/A;  6 hours OR time. Need extended stay recovery bed.   SALPINGOOPHORECTOMY Bilateral 05/14/2018   Procedure: SALPINGO OOPHORECTOMY;  Surgeon: [name], MD;  [facility];  Service: Gynecology;  Laterality: Bilateral;   THYMECTOMY  1999   TRANSVAGINAL TAPE (TVT) REMOVAL N/A 05/30/2018   Procedure: TRANSVAGINAL TAPE (TVT)  Revision;  Surgeon: [name], MD;  Location: [place];  Service: Gynecology;  Laterality: N/A;   WISDOM TOOTH EXTRACTION      SOCIAL HISTORY: Social History   Socioeconomic History   Marital status: Married    Spouse name: Charles   Number of children: 2   Years of education: 4   Highest education level: Not on file  Occupational History    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, Lab  Tobacco Use   Smoking status: Never   Smokeless tobacco: Never  Vaping Use   Vaping Use: Never used  Substance and Sexual Activity   Alcohol use: Not Currently    Alcohol/week: 0.0 standard drinks   Drug use: Never   Sexual activity: Not Currently    Partners: Male    Birth control/protection: Surgical    Comment: TAH--ovaries remain  Other Topics Concern   Not on file  Social History Narrative   Lives with husband   Caffeine- coffee, 2 cups daily   Right handed   Social Determinants of Health   Financial Resource Strain: Not on file  Food Insecurity: Not on file  Transportation Needs: Not on file  Physical Activity: Not on file  Stress: Not on file  Social Connections: Not on file  Intimate Partner Violence: Not on file    FAMILY HISTORY: Family History  Problem Relation Age of  Onset   COPD Father        dec age 5/s-smoked   Macular degeneration Father    Diabetes Brother    Other Brother        committed suicide age 1   Macular degeneration Brother    Hyperlipidemia Mother    Diabetes Maternal Grandmother    Heart attack Paternal Grandfather     ALLERGIES:  is allergic to ampicillin and penicillins.  MEDICATIONS:  . Current Outpatient Medications:    Ascorbic Acid (VITAMIN C) 1000 MG tablet, Take 1,000 mg by mouth 2 (two) times daily., Disp: , Rfl:    aspirin EC 81 MG tablet, Take 81 mg by mouth daily., Disp: , Rfl:    b complex vitamins capsule, Take 1 capsule by mouth daily., Disp: , Rfl:    Cholecalciferol (VITAMIN D3) 30 MCG/15ML LIQD, Take by mouth. Pt. Takes 5000 IU daily, Disp: , Rfl:    EPINEPHrine 0.3 mg/0.3 mL IJ SOAJ injection, INJECT INTRAMUSCULARLLY AS DIRECTED AS NEEDED FOR ANAPHYLAXIS, Disp: , Rfl:    estradiol (ESTRACE) 0.1 MG/GM vaginal cream, Insert 1/2 gram vaginally twice weekly at bedtime., Disp: 45 g, Rfl: 1   lenalidomide (REVLIMID) 10 MG capsule, TAKE ONE CAPSULE BY MOUTH DAILY FOR 21 DAYS, THEN 7 DAYS OFF, Disp: 21 capsule, Rfl: 0   Magnesium 400 MG CAPS, Take by mouth., Disp: , Rfl:    LABORATORY DATA:  I have reviewed the data as listed  . CBC Latest Ref Rng & Units 07/08/2021 04/08/2021 12/10/2020  WBC 4.0 - 10.5 K/uL 3.5(L) 4.4 5.5  Hemoglobin 12.0 - 15.0 g/dL 13.1 12.7 13.4  Hematocrit 36.0 - 46.0 % 40.3 38.2 40.2  Platelets 150 - 400 K/uL 290 229 287    . CMP Latest Ref Rng & Units 07/08/2021 04/08/2021 12/10/2020  Glucose 70 - 99 mg/dL 84 138(H) 99  BUN 8 - 23 mg/dL _0 Creatinine 0.44 - 1.00 mg/dL 0.83 0.81 0.90  Sodium 135 - 145 mmol/L 136 138 139  Potassium 3.5 - 5.1 mmol/L 4.4 3.8 4.0  Chloride 98 - 111 mmol/L 102 104 106  CO2 22 - 32  mmol/L _0 Calcium 8.9 - 10.3 mg/dL 9.3 9.3 9.3  Total Protein 6.5 - 8.1 g/dL 8.0 7.4 7.8  Total Bilirubin 0.3 - 1.2 mg/dL 1.0 0.8 1.3(H)  Alkaline Phos 38 - 126  U/L 75 79 85  AST 15 - 41 U/L _1 ALT 0 - 44 U/L _2 10/10/2019 Flow Cytometry (Repeat BM Bx from [facility]):   10/07/18 Repeat BM Bx from [facility]:    RADIOGRAPHIC STUDIES: I have personally reviewed the radiological images as listed and agreed with the findings in the report. No results found.  ASSESSMENT & PLAN:   65 y.o. caucasian female with  1. H/o Multiple myeloma - currently in remission.  IgA lambda R-ISS stage II multiple myeloma with innumerable lytic lesions in the calvarium, lesion in T7 vertebra and multiple lesions in the pelvis.  No significant bone pain at this time. Diagnosed in January 2017.  s/p treatment as noted above 10/07/18 BM Bx and Flow cytometry indicate that the pt continues to be in CR 10/10/2019 Flow Cytometry revealed "A. Bone Marrow (Plasma Cell Myeloma Minimal Residual Disease Detection by Flow Cytometry): Negative. No phenotypically abnormal plasma cells at or above the limit of detection identified."  2.Pre-op assessment for myeloma Held Revlimid from 04/30/18 through her 05/14/18 uterine lift procedure and the bunionectomy on 06/25/18 to decrease risk of clots  PLAN:  -Discussed pt labwork today, 07/08/2021; counts and chemistries normal. Myeloma panel - no M spike noted. -The pt has no prohibitive toxicities from continuing maintenance Revlimid at this time. -Continue Vitamin D 5000 IU 5x per week and 10000 IU 2x per week  -Continue ASA for VTE prophylaxis -Will see back in 3 months with labs.   FOLLOW UP:  RTC with Dr Irene Limbo with labs in 3 months  . The total time spent in the appointment was 20 minutes and more than 50% was on counseling and direct patient cares.   All of the patient's questions were answered with apparent satisfaction. The patient knows to call the clinic with any problems, questions or concerns.   Sullivan Lone MD [place] AAHIVMS [facility] Hematology/Oncology Physician [facility]

## 2021-07-20 ENCOUNTER — Ambulatory Visit
Admission: RE | Admit: 2021-07-20 | Discharge: 2021-07-20 | Disposition: A | Discharge status: Home / Self Care | Payer: Medicare Other | Source: Ambulatory Visit | Attending: Physician Assistant | Admitting: Physician Assistant

## 2021-07-20 MED ORDER — GADOBENATE DIMEGLUMINE 529 MG/ML IV SOLN
13.0000 mL | Freq: Once | INTRAVENOUS | Status: AC | PRN
  Administered 2021-07-20: 13 mL via INTRAVENOUS

## 2021-07-29 ENCOUNTER — Encounter: Payer: Self-pay | Admitting: Hematology

## 2021-07-29 ENCOUNTER — Other Ambulatory Visit: Payer: Self-pay

## 2021-07-29 DIAGNOSIS — C9001 Multiple myeloma in remission: Secondary | ICD-10-CM

## 2021-07-29 MED ORDER — LENALIDOMIDE 10 MG PO CAPS: ORAL_CAPSULE | ORAL | 0 refills | Status: DC

## 2021-08-01 ENCOUNTER — Other Ambulatory Visit: Payer: Self-pay

## 2021-08-01 DIAGNOSIS — C9001 Multiple myeloma in remission: Secondary | ICD-10-CM

## 2021-08-01 MED ORDER — LENALIDOMIDE 10 MG PO CAPS: ORAL_CAPSULE | ORAL | 0 refills | Status: DC

## 2021-08-24 ENCOUNTER — Other Ambulatory Visit: Payer: Self-pay

## 2021-08-24 DIAGNOSIS — C9001 Multiple myeloma in remission: Secondary | ICD-10-CM

## 2021-08-24 MED ORDER — LENALIDOMIDE 10 MG PO CAPS: ORAL_CAPSULE | ORAL | 0 refills | Status: DC

## 2021-09-23 ENCOUNTER — Other Ambulatory Visit: Payer: Self-pay

## 2021-09-23 DIAGNOSIS — C9001 Multiple myeloma in remission: Secondary | ICD-10-CM

## 2021-09-23 MED ORDER — LENALIDOMIDE 10 MG PO CAPS: ORAL_CAPSULE | ORAL | 0 refills | Status: DC

## 2021-10-07 ENCOUNTER — Other Ambulatory Visit: Payer: Self-pay | Admitting: *Deleted

## 2021-10-07 DIAGNOSIS — C9001 Multiple myeloma in remission: Secondary | ICD-10-CM

## 2021-10-10 ENCOUNTER — Inpatient Hospital Stay: Payer: Medicare Other

## 2021-10-10 ENCOUNTER — Other Ambulatory Visit: Payer: Self-pay

## 2021-10-10 ENCOUNTER — Inpatient Hospital Stay: Payer: Medicare Other | Attending: Hematology | Admitting: Hematology

## 2021-10-10 VITALS — BP 108/74 | HR 79 | Temp 97.9°F | Resp 16 | Ht 68.0 in | Wt 140.6 lb

## 2021-10-10 DIAGNOSIS — Z833 Family history of diabetes mellitus: Secondary | ICD-10-CM | POA: Diagnosis not present

## 2021-10-10 DIAGNOSIS — C9001 Multiple myeloma in remission: Secondary | ICD-10-CM

## 2021-10-10 DIAGNOSIS — E559 Vitamin D deficiency, unspecified: Secondary | ICD-10-CM

## 2021-10-10 DIAGNOSIS — Z8349 Family history of other endocrine, nutritional and metabolic diseases: Secondary | ICD-10-CM | POA: Diagnosis not present

## 2021-10-10 DIAGNOSIS — Z79899 Other long term (current) drug therapy: Secondary | ICD-10-CM | POA: Diagnosis not present

## 2021-10-10 DIAGNOSIS — Z836 Family history of other diseases of the respiratory system: Secondary | ICD-10-CM | POA: Diagnosis not present

## 2021-10-10 DIAGNOSIS — Z83518 Family history of other specified eye disorder: Secondary | ICD-10-CM | POA: Insufficient documentation

## 2021-10-10 LAB — CMP (CANCER CENTER ONLY)
ALT: 19 U/L (ref 0–44)
AST: 16 U/L (ref 15–41)
Albumin: 3.9 g/dL (ref 3.5–5.0)
Alkaline Phosphatase: 71 U/L (ref 38–126)
Anion gap: 8 (ref 5–15)
BUN: 21 mg/dL (ref 8–23)
CO2: 27 mmol/L (ref 22–32)
Calcium: 9.1 mg/dL (ref 8.9–10.3)
Chloride: 106 mmol/L (ref 98–111)
Creatinine: 0.9 mg/dL (ref 0.44–1.00)
GFR, Estimated: >60 mL/min (ref >60)
Glucose, Bld: 83 mg/dL (ref 70–99)
Potassium: 4.1 mmol/L (ref 3.5–5.1)
Sodium: 141 mmol/L (ref 135–145)
Total Bilirubin: 0.7 mg/dL (ref 0.3–1.2)
Total Protein: 7.1 g/dL (ref 6.5–8.1)

## 2021-10-10 LAB — CBC WITH DIFFERENTIAL (CANCER CENTER ONLY)
Abs Immature Granulocytes: 0.01 10*3/uL (ref 0.00–0.07)
Basophils Absolute: 0 10*3/uL (ref 0.0–0.1)
Basophils Relative: 1 %
Eosinophils Absolute: 0.2 10*3/uL (ref 0.0–0.5)
Eosinophils Relative: 3 %
HCT: 37.7 % (ref 36.0–46.0)
Hemoglobin: 12.5 g/dL (ref 12.0–15.0)
Immature Granulocytes: 0 %
Lymphocytes Relative: 18 %
Lymphs Abs: 0.8 10*3/uL (ref 0.7–4.0)
MCH: 31.5 pg (ref 26.0–34.0)
MCHC: 33.2 g/dL (ref 30.0–36.0)
MCV: 95 fL (ref 80.0–100.0)
Monocytes Absolute: 0.4 10*3/uL (ref 0.1–1.0)
Monocytes Relative: 9 %
Neutro Abs: 3 10*3/uL (ref 1.7–7.7)
Neutrophils Relative %: 69 %
Platelet Count: 296 10*3/uL (ref 150–400)
RBC: 3.97 MIL/uL (ref 3.87–5.11)
RDW: 14 % (ref 11.5–15.5)
WBC Count: 4.4 10*3/uL (ref 4.0–10.5)
nRBC: 0 % (ref 0.0–0.2)

## 2021-10-12 ENCOUNTER — Telehealth: Payer: Self-pay | Admitting: Hematology

## 2021-10-12 NOTE — Telephone Encounter (Signed)
Left message with follow-up appointment per 1/16 los. °

## 2021-10-14 LAB — MULTIPLE MYELOMA PANEL, SERUM
Albumin SerPl Elph-Mcnc: 3.5 g/dL (ref 2.9–4.4)
Albumin/Glob SerPl: 1.2 (ref 0.7–1.7)
Alpha 1: 0.2 g/dL (ref 0.0–0.4)
Alpha2 Glob SerPl Elph-Mcnc: 0.7 g/dL (ref 0.4–1.0)
B-Globulin SerPl Elph-Mcnc: 1.1 g/dL (ref 0.7–1.3)
Gamma Glob SerPl Elph-Mcnc: 1.1 g/dL (ref 0.4–1.8)
Globulin, Total: 3.1 g/dL (ref 2.2–3.9)
IgA: 490 mg/dL — ABNORMAL HIGH (ref 87–352)
IgG (Immunoglobin G), Serum: 1063 mg/dL (ref 586–1602)
IgM (Immunoglobulin M), Srm: 41 mg/dL (ref 26–217)
Total Protein ELP: 6.6 g/dL (ref 6.0–8.5)

## 2021-10-16 ENCOUNTER — Encounter: Payer: Self-pay | Admitting: Hematology

## 2021-10-16 NOTE — Progress Notes (Signed)
Marland Kitchen  HEMATOLOGY/ONCOLOGY CLINIC NOTE  Date of Service: .10/10/2021   PCP: [name], MD  CHIEF COMPLAINT: F/u for continue mx of multiple myeloma  CHIEF COMPLAINTS/PURPOSE OF CONSULTATION:  Follow-up for continued evaluation and management of multiple myeloma  DIAGNOSIS   IgA lambda R-ISS stage II multiple myeloma with innumerable lytic lesions in the calvarium, lesion in T7 vertebra and multiple lesions in the pelvis.  No significant bone pain at this time. Diagnosed in January 2017.   Current treatment   Revlimid $RemoveB'10mg'FxftCcfH$  po daily 3 weeks on 1 week off (after treatment interruption)  Previous Treatment   Status post Vd x 1 cycle VRd x 6 cycles   HD Melphalan $RemoveBefo'200mg'RFHIxHoVNmX$ /m2 on 03/22/2016 Autologous HSCT on 03/23/2016 with Dr Samule [facility]m at [facility] (Dose of 6.4 x10^6/kg)   Current rx -Revlimid maintenance    INTERVAL HISTORY:   Ms. Shivley is here for continued evaluation and management of her multiple myeloma.  She notes she has been doing well and has no acute new focal symptoms. No new bone pains.  No fevers no chills no night sweats. Mild grade 1 muscle cramps from Revlimid. No other reported prohibitive toxicities from Revlimid.   Labs done today show normal CBC, normal CMP. Myeloma panel with no monoclonal protein spike   REVIEW OF SYSTEMS:   .10 Point review of Systems was done is negative except as noted above.  PHYSICAL EXAMINATION: .BP 108/74 (BP Location: Left Arm, Patient Position: Sitting)    Pulse 79    Temp 97.9 F (36.6 C) (Temporal)    Resp 16    Ht $R'5\' 8"'HP$  (1.727 m)    Wt 140 lb 9.6 oz (63.8 kg)    LMP 09/26/1995 (Within Months)    SpO2 99%    BMI 21.38 kg/m  . GENERAL:alert, in no acute distress and comfortable SKIN: no acute rashes, no significant lesions EYES: conjunctiva are pink and non-injected, sclera anicteric OROPHARYNX: MMM, no exudates, no oropharyngeal erythema or ulceration NECK: supple, no JVD LYMPH:  no palpable lymphadenopathy in the  cervical, axillary or inguinal regions LUNGS: clear to auscultation b/l with normal respiratory effort HEART: regular rate & rhythm ABDOMEN:  normoactive bowel sounds , non tender, not distended. Extremity: no pedal edema PSYCH: alert & oriented x 3 with fluent speech NEURO: no focal motor/sensory deficits    MEDICAL HISTORY:   Past Medical History:  Diagnosis Date   Abdominal pain    Abnormal Pap smear of vagina 03/22/2017   ASCUS pap and negative HR HPV.  Patient is on immunosuppressive medication.    Anemia    AR (aortic regurgitation) 11/30/2017   trace noted on ECHO   Atrial septal aneurysm per 11-30-17 echo   trivial pericardial effusion   Bunion 05/13/2018   Cancer ([place]) 1999   squamous cell carcinoma of thymus   Counseling regarding advanced care planning and goals of care 08/03/2017   Cystocele, unspecified (CODE) 03/06/2018   DDD (degenerative disc disease), cervical    DDD (degenerative disc disease), lumbosacral    DYSPNEA 12/04/2007   Qualifier: Diagnosis of  By: Gwenette Greet MD, Armando Reichert    Facet degeneration of lumbar region    Facet joint disease of cervical region    Fatigue    Fatty liver    Fibroid    reason for Hysterectomy   FTT (failure to thrive) in adult 08/09/2017   Gastroesophageal reflux disease without esophagitis 01/17/2017   GERD (gastroesophageal reflux disease)    Grade I diastolic dysfunction  Hematuria 11/08/2015   History of COVID-19 12/2020   History of cystocele    History of stress incontinence    Hypergammaglobulinemia    Hyperlipidemia    HYPERLIPIDEMIA 12/04/2007   Qualifier: Diagnosis of  By: Gwenette Greet MD, Armando Reichert    Hypoxemia 12/16/2007   Qualifier: Diagnosis of  By: Gwenette Greet MD, Armando Reichert    Lung nodule    MALIGNANT NEOPLASM OF THYMUS 12/03/2007   Annotation: resection Qualifier: History of  By: Doy Mince LPN, Megan     Metastatic multiple myeloma to bone ([place]) 10/19/2015   Migraine    episode of aphasia worked up with echo by [place]  cardiology 11-23-17, has visual migraines   MR (mitral regurgitation) 11/30/2017   trace noted on ECHO   Multiple myeloma ([place]) 08/2015   metastatic to bone   Muscular fasciculation    Nasal septal deviation    Nasal turbinate hypertrophy    Near syncope    OBSTRUCTIVE SLEEP APNEA 12/03/2007   Qualifier: Diagnosis of  By: Doy Mince LPN, Megan     Other fatigue    Peripheral edema    Plasma cell dyscrasia    PONV (postoperative nausea and vomiting)    thinks morphine caused nausea   Postoperative urinary retention 05/14/2018   Rash 11/01/2015   Restless leg syndrome    S/P autologous bone marrow transplantation ([place]) 03/29/2016   Sleep apnea    uses old oral allialnce for osa due to cpap intolerance   Status post surgery 05/14/2018   TR (tricuspid regurgitation) 11/30/2017   trace noted on ECHO   Urinary incontinence    Urinary tract infection without hematuria    UTI (urinary tract infection)    Vaginal granulation tissue 12/03/2017   Wears glasses     SURGICAL HISTORY: Past Surgical History:  Procedure Laterality Date   ABDOMINAL HYSTERECTOMY  1997   TAH--ovaries remain--Dr. Newton Pigg   ANTERIOR AND POSTERIOR REPAIR N/A 05/14/2018   Procedure: ANTERIOR (CYSTOCELE) AND POSTERIOR REPAIR (RECTOCELE);  Surgeon: [name], MD;  [facility];  Service: Gynecology;  Laterality: N/A;   BLADDER SUSPENSION N/A 05/14/2018   Procedure: TRANSVAGINAL TAPE (TVT) PROCEDURE exact midurethral sling;  Surgeon: [name], MD;  [facility];  Service: Gynecology;  Laterality: N/A;   BONE MARROW TRANSPLANT  2017   done at [place] N/A 05/14/2018   Procedure: CYSTOSCOPY;  Surgeon: [name], MD;  [facility];  Service: Gynecology;  Laterality: N/A;   CYSTOSCOPY N/A 05/30/2018   Procedure: CYSTOSCOPY;  Surgeon: [name], MD;  Location: [facility];  Service:  Gynecology;  Laterality: N/A;   ROBOTIC ASSISTED LAPAROSCOPIC SACROCOLPOPEXY N/A 05/14/2018   Procedure: XI ROBOTIC ASSISTED LAPAROSCOPIC SACROCOLPOPEXY;  Surgeon: [name], MD;  [facility];  Service: Gynecology;  Laterality: N/A;  6 hours OR time. Need extended stay recovery bed.   SALPINGOOPHORECTOMY Bilateral 05/14/2018   Procedure: SALPINGO OOPHORECTOMY;  Surgeon: [name], MD;  [facility];  Service: Gynecology;  Laterality: Bilateral;   THYMECTOMY  1999   TRANSVAGINAL TAPE (TVT) REMOVAL N/A 05/30/2018   Procedure: TRANSVAGINAL TAPE (TVT)  Revision;  Surgeon: [name], MD;  Location: [facility];  Service: Gynecology;  Laterality: N/A;   WISDOM TOOTH EXTRACTION      SOCIAL HISTORY: Social  History   Socioeconomic History   Marital status: Married    Spouse name: [name]   Number of children: 2   Years of education: 4   Highest education level: Not on file  Occupational History    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, Lab  Tobacco Use   Smoking status: Never   Smokeless tobacco: Never  Vaping Use   Vaping Use: Never used  Substance and Sexual Activity   Alcohol use: Not Currently    Alcohol/week: 0.0 standard drinks   Drug use: Never   Sexual activity: Not Currently    Partners: Male    Birth control/protection: Surgical    Comment: TAH--ovaries remain  Other Topics Concern   Not on file  Social History Narrative   Lives with husband   Caffeine- coffee, 2 cups daily   Right handed   Social Determinants of Health   Financial Resource Strain: Not on file  Food Insecurity: Not on file  Transportation Needs: Not on file  Physical Activity: Not on file  Stress: Not on file  Social Connections: Not on file  Intimate Partner Violence: Not on file    FAMILY HISTORY: Family History  Problem Relation Age of Onset   COPD Father        dec age 24/s-smoked   Macular degeneration Father    Diabetes  Brother    Other Brother        committed suicide age 18   Macular degeneration Brother    Hyperlipidemia Mother    Diabetes Maternal Grandmother    Heart attack Paternal Grandfather     ALLERGIES:  is allergic to ampicillin and penicillins.  MEDICATIONS:  . Current Outpatient Medications:    Ascorbic Acid (VITAMIN C) 1000 MG tablet, Take 1,000 mg by mouth 2 (two) times daily., Disp: , Rfl:    aspirin EC 81 MG tablet, Take 81 mg by mouth daily., Disp: , Rfl:    b complex vitamins capsule, Take 1 capsule by mouth daily., Disp: , Rfl:    Cholecalciferol (VITAMIN D3) 30 MCG/15ML LIQD, Take by mouth. Pt. Takes 5000 IU daily, Disp: , Rfl:    Cyanocobalamin (B-12) 1000 MCG CAPS, , Disp: , Rfl:    EPINEPHrine 0.3 mg/0.3 mL IJ SOAJ injection, INJECT INTRAMUSCULARLLY AS DIRECTED AS NEEDED FOR ANAPHYLAXIS, Disp: , Rfl:    estradiol (ESTRACE) 0.1 MG/GM vaginal cream, Insert 1/2 gram vaginally twice weekly at bedtime., Disp: 45 g, Rfl: 1   lenalidomide (REVLIMID) 10 MG capsule, TAKE ONE CAPSULE BY MOUTH DAILY FOR 21 DAYS, THEN 7 DAYS OFF, Disp: 21 capsule, Rfl: 0   Magnesium 400 MG CAPS, Take by mouth., Disp: , Rfl:    LABORATORY DATA:  I have reviewed the data as listed  . CBC Latest Ref Rng & Units 10/10/2021 07/08/2021 04/08/2021  WBC 4.0 - 10.5 K/uL 4.4 3.5(L) 4.4  Hemoglobin 12.0 - 15.0 g/dL 12.5 13.1 12.7  Hematocrit 36.0 - 46.0 % 37.7 40.3 38.2  Platelets 150 - 400 K/uL 296 290 229    . CMP Latest Ref Rng & Units 10/10/2021 07/08/2021 04/08/2021  Glucose 70 - 99 mg/dL 83 84 138(H)  BUN 8 - 23 mg/dL _0 Creatinine 0.44 - 1.00 mg/dL 0.90 0.83 0.81  Sodium 135 - 145 mmol/L 141 136 138  Potassium 3.5 - 5.1 mmol/L 4.1 4.4 3.8  Chloride 98 - 111 mmol/L 106 102 104  CO2 22 - 32 mmol/L _1 Calcium 8.9 - 10.3 mg/dL  9.1 9.3 9.3  Total Protein 6.5 - 8.1 g/dL 7.1 8.0 7.4  Total Bilirubin 0.3 - 1.2 mg/dL 0.7 1.0 0.8  Alkaline Phos 38 - 126 U/L 71 75 79  AST 15 - 41 U/L _0 ALT 0 - 44 U/L _1 10/10/2019 Flow Cytometry (Repeat BM Bx from [facility]):   10/07/18 Repeat BM Bx from [facility]:    RADIOGRAPHIC STUDIES: I have personally reviewed the radiological images as listed and agreed with the findings in the report. No results found.  ASSESSMENT & PLAN:   66 y.o. caucasian female with  1. H/o Multiple myeloma - currently in remission.  IgA lambda R-ISS stage II multiple myeloma with innumerable lytic lesions in the calvarium, lesion in T7 vertebra and multiple lesions in the pelvis.  No significant bone pain at this time. Diagnosed in January 2017.  s/p treatment as noted above 10/07/18 BM Bx and Flow cytometry indicate that the pt continues to be in CR 10/10/2019 Flow Cytometry revealed "A. Bone Marrow (Plasma Cell Myeloma Minimal Residual Disease Detection by Flow Cytometry): Negative. No phenotypically abnormal plasma cells at or above the limit of detection identified."  PLAN:  -Discussed patient's lab results from 10/10/2021.  CBC normal, CMP normal Myeloma panel with no detectable monoclonal protein spike. -Patient has no prohibitive toxicities from continuing maintenance Revlimid at this time -Continue Vitamin D 5000 IU 5x per week and 10000 IU 2x per week  -Continue ASA for VTE prophylaxis -Will see back in 3 months with labs. -Continue follow-up with Dr. Samule Ohm at [facility] as per her recommendations.  FOLLOW UP:  RTC with Dr Irene Limbo with labs in 3 months   All of the patient's questions were answered with apparent satisfaction. The patient knows to call the clinic with any problems, questions or concerns.   Sullivan Lone MD [place] AAHIVMS [facility] Hematology/Oncology Physician [facility]

## 2021-10-20 ENCOUNTER — Other Ambulatory Visit: Payer: Self-pay

## 2021-10-20 DIAGNOSIS — C9001 Multiple myeloma in remission: Secondary | ICD-10-CM

## 2021-10-20 MED ORDER — LENALIDOMIDE 10 MG PO CAPS: ORAL_CAPSULE | ORAL | 0 refills | Status: DC

## 2021-11-16 ENCOUNTER — Other Ambulatory Visit: Payer: Self-pay

## 2021-11-16 DIAGNOSIS — C9001 Multiple myeloma in remission: Secondary | ICD-10-CM

## 2021-11-16 MED ORDER — LENALIDOMIDE 10 MG PO CAPS: ORAL_CAPSULE | ORAL | 0 refills | Status: DC

## 2021-12-13 ENCOUNTER — Other Ambulatory Visit: Payer: Self-pay

## 2021-12-13 DIAGNOSIS — C9001 Multiple myeloma in remission: Secondary | ICD-10-CM

## 2021-12-13 MED ORDER — LENALIDOMIDE 10 MG PO CAPS: ORAL_CAPSULE | ORAL | 0 refills | Status: DC

## 2021-12-22 ENCOUNTER — Ambulatory Visit (INDEPENDENT_AMBULATORY_CARE_PROVIDER_SITE_OTHER): Payer: Medicare Other | Admitting: Psychology

## 2021-12-22 ENCOUNTER — Encounter: Payer: Self-pay | Admitting: Psychology

## 2021-12-22 ENCOUNTER — Ambulatory Visit: Payer: Medicare Other | Admitting: Psychology

## 2021-12-22 DIAGNOSIS — R4189 Other symptoms and signs involving cognitive functions and awareness: Secondary | ICD-10-CM

## 2021-12-22 DIAGNOSIS — M5030 Other cervical disc degeneration, unspecified cervical region: Secondary | ICD-10-CM | POA: Insufficient documentation

## 2021-12-22 DIAGNOSIS — R630 Anorexia: Secondary | ICD-10-CM | POA: Insufficient documentation

## 2021-12-22 DIAGNOSIS — M2170 Unequal limb length (acquired), unspecified site: Secondary | ICD-10-CM | POA: Insufficient documentation

## 2021-12-22 DIAGNOSIS — E785 Hyperlipidemia, unspecified: Secondary | ICD-10-CM | POA: Insufficient documentation

## 2021-12-22 DIAGNOSIS — C9001 Multiple myeloma in remission: Secondary | ICD-10-CM | POA: Diagnosis not present

## 2021-12-22 DIAGNOSIS — I253 Aneurysm of heart: Secondary | ICD-10-CM | POA: Insufficient documentation

## 2021-12-22 DIAGNOSIS — T63441A Toxic effect of venom of bees, accidental (unintentional), initial encounter: Secondary | ICD-10-CM | POA: Insufficient documentation

## 2021-12-22 DIAGNOSIS — N816 Rectocele: Secondary | ICD-10-CM | POA: Insufficient documentation

## 2021-12-22 DIAGNOSIS — G4733 Obstructive sleep apnea (adult) (pediatric): Secondary | ICD-10-CM

## 2021-12-22 DIAGNOSIS — E559 Vitamin D deficiency, unspecified: Secondary | ICD-10-CM | POA: Insufficient documentation

## 2021-12-22 DIAGNOSIS — R7303 Prediabetes: Secondary | ICD-10-CM | POA: Insufficient documentation

## 2021-12-22 DIAGNOSIS — E7800 Pure hypercholesterolemia, unspecified: Secondary | ICD-10-CM | POA: Insufficient documentation

## 2021-12-22 DIAGNOSIS — G4700 Insomnia, unspecified: Secondary | ICD-10-CM | POA: Insufficient documentation

## 2021-12-22 DIAGNOSIS — F411 Generalized anxiety disorder: Secondary | ICD-10-CM | POA: Insufficient documentation

## 2021-12-22 DIAGNOSIS — G43109 Migraine with aura, not intractable, without status migrainosus: Secondary | ICD-10-CM | POA: Insufficient documentation

## 2021-12-22 DIAGNOSIS — K5900 Constipation, unspecified: Secondary | ICD-10-CM | POA: Insufficient documentation

## 2021-12-22 DIAGNOSIS — M5137 Other intervertebral disc degeneration, lumbosacral region: Secondary | ICD-10-CM | POA: Insufficient documentation

## 2021-12-22 NOTE — Progress Notes (Signed)
? ?  Psychometrician Note ?  ?Cognitive testing was administered to Paula Johnson by Milana Kidney, B.S. (psychometrist) under the supervision of Dr. Christia Reading, Ph.D., licensed psychologist on 12/22/2021. Paula Johnson did not appear overtly distressed by the testing session per behavioral observation or responses across self-report questionnaires. Rest breaks were offered.  ?  ?The battery of tests administered was selected by Dr. Christia Reading, Ph.D. with consideration to Paula Johnson's current level of functioning, the nature of her symptoms, emotional and behavioral responses during interview, level of literacy, observed level of motivation/effort, and the nature of the referral question. This battery was communicated to the psychometrist. Communication between Dr. Christia Reading, Ph.D. and the psychometrist was ongoing throughout the evaluation and Dr. Christia Reading, Ph.D. was immediately accessible at all times. Dr. Christia Reading, Ph.D. provided supervision to the psychometrist on the date of this service to the extent necessary to assure the quality of all services provided.  ?  ?Paula Johnson will return within approximately 1-2 weeks for an interactive feedback session with Dr. Melvyn Novas at which time her test performances, clinical impressions, and treatment recommendations will be reviewed in detail. Paula Johnson understands she can contact our office should she require our assistance before this time. ? ?A total of 130 minutes of billable time were spent face-to-face with Paula Johnson by the psychometrist. This includes both test administration and scoring time. Billing for these services is reflected in the clinical report generated by Dr. Christia Reading, Ph.D. ? ?This note reflects time spent with the psychometrician and does not include test scores or any clinical interpretations made by Dr. Melvyn Novas. The full report will follow in a separate note.  ?

## 2021-12-22 NOTE — Progress Notes (Signed)
? ?NEUROPSYCHOLOGICAL EVALUATION ?[place]. [facility] ?[place] Department of Neurology ? ?Date of Evaluation: December 22, 2021 ? ?Reason for Referral:  ? ?Paula Johnson is a 66 y.o. right-handed Caucasian female referred by  Sharene Butters, PA-C , to characterize her current cognitive functioning and assist with diagnostic clarity and treatment planning in the context of subjective cognitive decline and numerous medical comorbidities.  ? ?Assessment and Plan:  ? ?Clinical Impression(s): ?Paula Johnson's pattern of performance is suggestive of neuropsychological functioning within normal limits with many areas of cognitive strength relative to age-matched peers. No cognitive domains reflected any degree of impairment. Performance was strong across processing speed, attention/concentration, executive functioning, safety/judgment, receptive and expressive language, visuospatial abilities, and all aspects of learning and memory. Paula Johnson denied difficulties completing instrumental activities of daily living (ADLs) independently.  ? ?I have no concerns regarding the presence of a neurodegenerative illness or neurological cause for subjective cognitive complaints based upon current testing. Paula Johnson was able to learn novel verbal and visual information efficiently and retain this knowledge after lengthy delays. Overall, memory performance combined with intact performances across other areas of cognitive functioning is not suggestive of Alzheimer's disease. Likewise, her cognitive and behavioral profile is not suggestive of any other form of neurodegenerative illness presently. Subjective dysfunction could be due to normal age-related changes, as well as mild sleep disturbances and various psychosocial stressors. Medication side effects from Revlimid could also be playing a small role. Continued medical monitoring will be beneficial moving forward.  ? ?Recommendations: ?Should Paula Johnson experience cognitive  and/or functional decline in the future, a repeat evaluation would be warranted at that time. The current evaluation will serve as an excellent baseline to compare any future evaluation against.  ? ?Paula Johnson is encouraged to attend to lifestyle factors for brain health (e.g., regular physical exercise, good nutrition habits, regular participation in cognitively-stimulating activities, and general stress management techniques), which are likely to have benefits for both emotional adjustment and cognition. Optimal control of vascular risk factors (including safe cardiovascular exercise and adherence to dietary recommendations) is encouraged. Likewise, continued compliance with her mouthpiece to treat sleep apnea will also be important. Continued participation in activities which provide mental stimulation and social interaction is also recommended.  ? ?If interested, there are some activities which have therapeutic value and can be useful in keeping her cognitively stimulated. For suggestions, Paula Johnson is encouraged to go to the following website: https://www.[facility].org/get-to-know-barrow/centers-programs/neurorehabilitation-center/neuro-rehab-apps-and-games/ which has options, categorized by level of difficulty. It should be noted that these activities should not be viewed as a substitute for therapy. ? ?Memory can be improved using internal strategies such as rehearsal, repetition, chunking, mnemonics, association, and imagery. External strategies such as written notes in a consistently used memory journal, visual and nonverbal auditory cues such as a calendar on the refrigerator or appointments with alarm, such as on a cell phone, can also help maximize recall.   ? ?To address problems with fluctuating attention, she may wish to consider: ?  -Avoiding external distractions when needing to concentrate ?  -Limiting exposure to fast paced environments with multiple sensory demands ?  -Writing down complicated  information and using checklists ?  -Attempting and completing one task at a time (i.e., no multi-tasking) ?  -Verbalizing aloud each step of a task to maintain focus ?  -Reducing the amount of information considered at one time ? ?Review of Records:  ? ?Paula Johnson was seen by [facility] Sharene Butters, PA-C) on 07/06/2021  for an evaluation of memory loss. Memory concerns were said to be present for the past two years. Primary examples included trouble mastering newly learning piano songs, as well as entering a room and forgetting her original intention. She lives with her husband who did not report noticing any changes. There is a history of multiple myeloma with ongoing treatment. Sleep apnea is present and treated with a mask device (prior CPAP intervention could not be tolerated). There is also a history of migraine headaches ("about 3 over the last 3 years"). Trouble with ADLs was denied. Performance on a brief cognitive screening instrument (MOCA) was 30/30. Ultimately, Paula Johnson was referred for a comprehensive neuropsychological evaluation to characterize her cognitive abilities and to assist with diagnostic clarity and treatment planning.  ? ?She was most recently seen by Oncology on 10/10/2021 for follow-up. She was diagnosed with IgA lambda R-ISS stage II multiple myeloma with innumerable lytic lesions in the calvarium, lesion in T7 vertebra and multiple lesions in the pelvis, in January 2017. Current treatment includes Revlimid 76m po daily 3 weeks on 1 week off (after treatment interruption). She reported doing well with no new bone pains, fevers, chills, or night sweats. Recent labs were said to be normal. Recent myeloma panel did not show a monoclonal protein spike.  ? ?Brain MRI on 11/13/2017 was unremarkable. Brain MRI on 07/22/2021 was also unremarkable.  ? ?Past Medical History:  ?Diagnosis Date  ? Abdominal pain   ? Abnormal Pap smear of vagina 03/22/2017  ? ASCUS pap and negative HR HPV.   Patient is on immunosuppressive medication.   ? Allergic reaction to bee sting   ? Anemia   ? AR (aortic regurgitation) 11/30/2017  ? trace noted on ECHO  ? Atrial septal aneurysm per 11-30-17 echo  ? trivial pericardial effusion  ? Complicated migraine   ? Constipation   ? DDD (degenerative disc disease), cervical   ? DDD (degenerative disc disease), lumbosacral   ? Fatty liver   ? Female proctocele without uterine prolapse   ? Fibroid   ? reason for Hysterectomy  ? FTT (failure to thrive) in adult 08/09/2017  ? Gastroesophageal reflux disease without esophagitis 01/17/2017  ? Generalized anxiety disorder   ? Grade I diastolic dysfunction   ? Hammer toe 05/13/2018  ? Hematuria 11/08/2015  ? History of COVID-19 12/2020  ? History of stress incontinence   ? Hypergammaglobulinemia   ? Hyperlipidemia   ? Hypoxemia 12/16/2007  ? Insomnia   ? Leg length discrepancy   ? Loss of appetite   ? Lung nodule   ? 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  ? Metastatic multiple myeloma to bone 10/19/2015  ? Metatarsalgia of right foot 07/15/2018  ? MR (mitral regurgitation) 11/30/2017  ? trace noted on ECHO  ? Muscular fasciculation   ? Nasal septal deviation   ? Nasal turbinate hypertrophy   ? Near syncope   ? Obstructive sleep apnea 12/03/2007  ? Other fatigue   ? Peripheral edema   ? Plasma cell dyscrasia   ? PONV (postoperative nausea and vomiting)   ? thinks morphine caused nausea  ? Postoperative urinary retention 05/14/2018  ? Prediabetes   ? Pure hypercholesterolemia   ? Rash 11/01/2015  ? Restless leg syndrome   ? S/P autologous bone marrow transplantation 03/29/2016  ? Shortness of breath 12/04/2007  ? TR (tricuspid regurgitation) 11/30/2017  ? trace noted on ECHO  ? Urinary incontinence   ? UTI (urinary tract infection)   ?  Vaginal granulation tissue 12/03/2017  ? Vitamin D deficiency   ? Wears glasses   ?  ?Past Surgical History:  ?Procedure Laterality Date  ? ABDOMINAL HYSTERECTOMY  1997  ? TAH--ovaries remain--Dr.  Newton Pigg  ? ANTERIOR AND POSTERIOR REPAIR N/A 05/14/2018  ? Procedure: ANTERIOR (CYSTOCELE) AND POSTERIOR REPAIR (RECTOCELE);  Surgeon: [name], MD;  Location: [facility];  Service: Bridgette Habermann

## 2021-12-29 ENCOUNTER — Ambulatory Visit (INDEPENDENT_AMBULATORY_CARE_PROVIDER_SITE_OTHER): Payer: Medicare Other | Admitting: Psychology

## 2021-12-29 DIAGNOSIS — C9001 Multiple myeloma in remission: Secondary | ICD-10-CM

## 2021-12-29 DIAGNOSIS — R4189 Other symptoms and signs involving cognitive functions and awareness: Secondary | ICD-10-CM

## 2021-12-29 NOTE — Progress Notes (Signed)
? ?  Neuropsychology Feedback Session ?[place]. [facility] ?[place] Department of Neurology ? ?Reason for Referral:  ? ?Paula Johnson is a 66 y.o. right-handed Caucasian female referred by  Sharene Butters, PA-C , to characterize her current cognitive functioning and assist with diagnostic clarity and treatment planning in the context of subjective cognitive decline and numerous medical comorbidities.  ? ?Feedback:  ? ?Ms. Varnadore completed a comprehensive neuropsychological evaluation on 12/22/2021. Please refer to that encounter for the full report and recommendations. Briefly, results suggested neuropsychological functioning within normal limits with many areas of cognitive strength relative to age-matched peers. No cognitive domains reflected any degree of impairment. I have no concerns regarding the presence of a neurodegenerative illness or neurological cause for subjective cognitive complaints based upon current testing. Subjective dysfunction could be due to normal age-related changes, as well as mild sleep disturbances and various psychosocial stressors. Medication side effects from Revlimid could also be playing a small role. ? ?Ms. Servais was unaccompanied during the current telephone call. She was within her residence while I was within my office. I discussed the limitations of evaluation and management by telemedicine and the availability of in person appointments. Ms. Weiland expressed her understanding and agreed to proceed. Content of the current session focused on the results of her neuropsychological evaluation. Ms. Navia was given the opportunity to ask questions and her questions were answered. She was encouraged to reach out should additional questions arise. Her report is available to her on MyChart.  ? ?  ? ?Less than 16 minutes were spent conducting the current feedback session with Ms. Kropp.  ?

## 2022-01-01 ENCOUNTER — Encounter: Payer: Self-pay | Admitting: Physician Assistant

## 2022-01-09 ENCOUNTER — Ambulatory Visit: Payer: BLUE CROSS/BLUE SHIELD | Admitting: Physician Assistant

## 2022-01-10 ENCOUNTER — Other Ambulatory Visit: Payer: Self-pay

## 2022-01-10 DIAGNOSIS — C9001 Multiple myeloma in remission: Secondary | ICD-10-CM

## 2022-01-10 MED ORDER — LENALIDOMIDE 10 MG PO CAPS: ORAL_CAPSULE | ORAL | 0 refills | Status: DC

## 2022-01-11 ENCOUNTER — Encounter: Payer: Self-pay | Admitting: Hematology

## 2022-01-16 ENCOUNTER — Encounter: Payer: Self-pay | Admitting: Hematology

## 2022-01-16 ENCOUNTER — Inpatient Hospital Stay: Payer: Medicare Other

## 2022-01-16 ENCOUNTER — Other Ambulatory Visit (HOSPITAL_COMMUNITY): Payer: Self-pay

## 2022-01-16 ENCOUNTER — Inpatient Hospital Stay: Payer: Medicare Other | Attending: Hematology | Admitting: Hematology

## 2022-01-16 VITALS — BP 124/74 | HR 78 | Temp 98.1°F | Resp 16 | Wt 136.9 lb

## 2022-01-16 DIAGNOSIS — E559 Vitamin D deficiency, unspecified: Secondary | ICD-10-CM | POA: Diagnosis not present

## 2022-01-16 DIAGNOSIS — Z7982 Long term (current) use of aspirin: Secondary | ICD-10-CM | POA: Diagnosis not present

## 2022-01-16 DIAGNOSIS — Z79899 Other long term (current) drug therapy: Secondary | ICD-10-CM | POA: Diagnosis not present

## 2022-01-16 DIAGNOSIS — C9001 Multiple myeloma in remission: Secondary | ICD-10-CM | POA: Diagnosis present

## 2022-01-16 DIAGNOSIS — C9000 Multiple myeloma not having achieved remission: Secondary | ICD-10-CM | POA: Insufficient documentation

## 2022-01-16 LAB — CBC WITH DIFFERENTIAL/PLATELET
Abs Immature Granulocytes: 0.01 10*3/uL (ref 0.00–0.07)
Basophils Absolute: 0 10*3/uL (ref 0.0–0.1)
Basophils Relative: 1 %
Eosinophils Absolute: 0.2 10*3/uL (ref 0.0–0.5)
Eosinophils Relative: 4 %
HCT: 37.2 % (ref 36.0–46.0)
Hemoglobin: 12.3 g/dL (ref 12.0–15.0)
Immature Granulocytes: 0 %
Lymphocytes Relative: 17 %
Lymphs Abs: 0.6 10*3/uL — ABNORMAL LOW (ref 0.7–4.0)
MCH: 31.1 pg (ref 26.0–34.0)
MCHC: 33.1 g/dL (ref 30.0–36.0)
MCV: 93.9 fL (ref 80.0–100.0)
Monocytes Absolute: 0.5 10*3/uL (ref 0.1–1.0)
Monocytes Relative: 14 %
Neutro Abs: 2.3 10*3/uL (ref 1.7–7.7)
Neutrophils Relative %: 64 %
Platelets: 280 10*3/uL (ref 150–400)
RBC: 3.96 MIL/uL (ref 3.87–5.11)
RDW: 14.1 % (ref 11.5–15.5)
WBC: 3.6 10*3/uL — ABNORMAL LOW (ref 4.0–10.5)
nRBC: 0 % (ref 0.0–0.2)

## 2022-01-16 LAB — CMP (CANCER CENTER ONLY)
ALT: 19 U/L (ref 0–44)
AST: 17 U/L (ref 15–41)
Albumin: 4.2 g/dL (ref 3.5–5.0)
Alkaline Phosphatase: 81 U/L (ref 38–126)
Anion gap: 9 (ref 5–15)
BUN: 16 mg/dL (ref 8–23)
CO2: 29 mmol/L (ref 22–32)
Calcium: 9.6 mg/dL (ref 8.9–10.3)
Chloride: 103 mmol/L (ref 98–111)
Creatinine: 0.84 mg/dL (ref 0.44–1.00)
GFR, Estimated: >60 mL/min (ref >60)
Glucose, Bld: 86 mg/dL (ref 70–99)
Potassium: 3.5 mmol/L (ref 3.5–5.1)
Sodium: 141 mmol/L (ref 135–145)
Total Bilirubin: 0.9 mg/dL (ref 0.3–1.2)
Total Protein: 7.6 g/dL (ref 6.5–8.1)

## 2022-01-16 LAB — VITAMIN D 25 HYDROXY (VIT D DEFICIENCY, FRACTURES): Vit D, 25-Hydroxy: 54.05 ng/mL (ref 30–100)

## 2022-01-17 ENCOUNTER — Telehealth: Payer: Self-pay | Admitting: Hematology

## 2022-01-17 ENCOUNTER — Telehealth: Payer: Self-pay

## 2022-01-17 NOTE — Telephone Encounter (Signed)
Oral Oncology Patient Advocate Encounter ? ?Met patient in lobby to complete application for [place] in an effort to reduce patient's out of pocket expense for Revlimid to $0.   ? ?Application completed and faxed to [phone]  ? ?[facility] Access Support phone number for follow up is [phone]. ? ?This encounter will be updated until final determination. ? ?Wynn Maudlin CPHT ?Specialty Pharmacy Patient Advocate ?[place] ?Phone [phone] ?Fax [phone] ?01/17/2022 8:35 AM ? ? ?

## 2022-01-17 NOTE — Telephone Encounter (Signed)
Scheduled follow-up appointment per 4/24 los. Patient is aware. ?

## 2022-01-18 ENCOUNTER — Encounter: Payer: Self-pay | Admitting: Hematology

## 2022-01-18 LAB — MULTIPLE MYELOMA PANEL, SERUM
Albumin SerPl Elph-Mcnc: 3.8 g/dL (ref 2.9–4.4)
Albumin/Glob SerPl: 1.2 (ref 0.7–1.7)
Alpha 1: 0.2 g/dL (ref 0.0–0.4)
Alpha2 Glob SerPl Elph-Mcnc: 0.7 g/dL (ref 0.4–1.0)
B-Globulin SerPl Elph-Mcnc: 1.2 g/dL (ref 0.7–1.3)
Gamma Glob SerPl Elph-Mcnc: 1.2 g/dL (ref 0.4–1.8)
Globulin, Total: 3.3 g/dL (ref 2.2–3.9)
IgA: 509 mg/dL — ABNORMAL HIGH (ref 87–352)
IgG (Immunoglobin G), Serum: 1134 mg/dL (ref 586–1602)
IgM (Immunoglobulin M), Srm: 50 mg/dL (ref 26–217)
Total Protein ELP: 7.1 g/dL (ref 6.0–8.5)

## 2022-01-18 NOTE — Progress Notes (Signed)
. ? ?HEMATOLOGY/ONCOLOGY CLINIC NOTE ? ?Date of Service: .01/16/2022 ? ? ?PCP: [name], MD ? ?CHIEF COMPLAINT: ?Follow-up for continued evaluation and management of multiple myeloma. ? ?CHIEF COMPLAINTS/PURPOSE OF CONSULTATION:  ?Follow-up for continued evaluation and management of multiple myeloma ? ?DIAGNOSIS ?  ?IgA lambda R-ISS stage II multiple myeloma with innumerable lytic lesions in the calvarium, lesion in T7 vertebra and multiple lesions in the pelvis.  No significant bone pain at this time. ?Diagnosed in January 2017. ?  ?Current treatment ?  ?Revlimid 47m po daily 3 weeks on 1 week off (after treatment interruption) ? ?Previous Treatment ?  ?Status post Vd x 1 cycle ?VRd x 6 cycles ?  ?HD Melphalan 2047mm2 on 03/22/2016 ?[facility]23[facility]2016 [facility] ?  ?Current rx ?-Revlimid maintenance  ? ? ?INTERVAL HISTORY:  ? ?[name] is here for continued valuation of multiple myeloma. ?She continues to be on maintenance Revlimid without any new toxicities.  Minimal loose stools and intermittent grade 1 muscle cramping. ?No fevers no chills no night sweats.  No new focal bone pains. ?No new skin rashes. ?[facility] Ga[facility]ule [facility]mnd had repeat bone marrow biopsy that showed no morphologic evidence of myeloma recurrence MRD study was negative but her molecular cytology was FISH positive.  Patient notes that she is scheduled for another repeat bone marrow biopsy in about 6 months. ? ?Labs done today reviewed in detail with the patient. ? ? ?REVIEW OF SYSTEMS:   ?10 Point review of Systems was done is negative except as noted above. ? ?PHYSICAL EXAMINATION: ?.BP 124/74 (BP Location: Left Arm, Patient Position: Sitting)   Pulse 78   Temp 98.1 ?F (36.7 ?C) (Temporal)   Resp 16   Wt 136 lb 14.4 oz (62.1 kg)   LMP 09/26/1995 (Within Months)   SpO2 98%   BMI 20.82 kg/m?  ?. ?NAD ?GENERAL:alert, in no acute distress and  comfortable ?SKIN: no acute rashes, no significant lesions ?EYES: conjunctiva are pink and non-injected, sclera anicteric ?OROPHARYNX: MMM, no exudates, no oropharyngeal erythema or ulceration ?NECK: supple, no JVD ?LYMPH:  no palpable lymphadenopathy in the cervical, axillary or inguinal regions ?LUNGS: clear to auscultation b/l with normal respiratory effort ?HEART: regular rate & rhythm ?ABDOMEN:  normoactive bowel sounds , non tender, not distended. ?Extremity: no pedal edema ?PSYCH: alert & oriented x 3 with fluent speech ?NEURO: no focal motor/sensory deficits ? ? ? ?MEDICAL HISTORY:  ? ?Past Medical History:  ?Diagnosis Date  ? Abdominal pain   ? Abnormal Pap smear of vagina 03/22/2017  ? ASCUS pap and negative HR HPV.  Patient is on immunosuppressive medication.   ? Allergic reaction to bee sting   ? Anemia   ? AR (aortic regurgitation) 11/30/2017  ? trace noted on ECHO  ? Atrial septal aneurysm per 11-30-17 echo  ? trivial pericardial effusion  ? Complicated migraine   ? Constipation   ? DDD (degenerative disc disease), cervical   ? DDD (degenerative disc disease), lumbosacral   ? Fatty liver   ? Female proctocele without uterine prolapse   ? Fibroid   ? reason for Hysterectomy  ? FTT (failure to thrive) in adult 08/09/2017  ? Gastroesophageal reflux disease without esophagitis 01/17/2017  ? Generalized anxiety disorder   ? Grade I diastolic dysfunction   ? Hammer toe 05/13/2018  ? Hematuria 11/08/2015  ? History of COVID-19 12/2020  ? History of stress  incontinence   ? Hypergammaglobulinemia   ? Hyperlipidemia   ? Hypoxemia 12/16/2007  ? Insomnia   ? Leg length discrepancy   ? Loss of appetite   ? Lung nodule   ? 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  ? Metastatic multiple myeloma to bone 10/19/2015  ? Metatarsalgia of right foot 07/15/2018  ? MR (mitral regurgitation) 11/30/2017  ? trace noted on ECHO  ? Muscular fasciculation   ? Nasal septal deviation   ? Nasal turbinate hypertrophy   ? Near syncope   ?  Obstructive sleep apnea 12/03/2007  ? Other fatigue   ? Peripheral edema   ? Plasma cell dyscrasia   ? PONV (postoperative nausea and vomiting)   ? thinks morphine caused nausea  ? Postoperative urinary retention 05/14/2018  ? Prediabetes   ? Pure hypercholesterolemia   ? Rash 11/01/2015  ? Restless leg syndrome   ? S/P autologous bone marrow transplantation 03/29/2016  ? Shortness of breath 12/04/2007  ? TR (tricuspid regurgitation) 11/30/2017  ? trace noted on ECHO  ? Urinary incontinence   ? UTI (urinary tract infection)   ? Vaginal granulation tissue 12/03/2017  ? Vitamin D deficiency   ? Wears glasses   ? ? ?SURGICAL HISTORY: ?Past Surgical History:  ?Procedure Laterality Date  ? ABDOMINAL HYSTERECTOMY  1997  ? TAH--ovaries remain--Dr. Newton Pigg  ? ANTERIOR AND POSTERIOR REPAIR N/A 05/14/2018  ? Procedure: ANTERIOR (CYSTOCELE) AND POSTERIOR REPAIR (RECTOCELE);  Surgeon: [name], MD;  Location: [facility];  Service: Gynecology;  Laterality: N/A;  ? BLADDER SUSPENSION N/A 05/14/2018  ? Procedure: TRANSVAGINAL TAPE (TVT) PROCEDURE exact midurethral sling;  Surgeon: [name], MD;  Location: [facility];  Service: Gynecology;  Laterality: N/A;  ? BONE MARROW TRANSPLANT  2017  ? done at [facility]  ? BREAST BIOPSY    ? COLONOSCOPY    ? CYSTOSCOPY N/A 05/14/2018  ? Procedure: CYSTOSCOPY;  Surgeon: [name], MD;  Location: [facility];  Service: Gynecology;  Laterality: N/A;  ? CYSTOSCOPY N/A 05/30/2018  ? Procedure: CYSTOSCOPY;  Surgeon: [name], MD;  [facility];  Service: Gynecology;  Laterality: N/A;  ? ROBOTIC ASSISTED LAPAROSCOPIC SACROCOLPOPEXY N/A 05/14/2018  ? Procedure: XI ROBOTIC ASSISTED LAPAROSCOPIC SACROCOLPOPEXY;  Surgeon: [name], MD;  Location: [facility];  Service: Gynecology;  Laterality: N/A;  6 hours OR time. Need extended stay recovery bed.  ? SALPINGOOPHORECTOMY Bilateral 05/14/2018  ? Procedure: SALPINGO  OOPHORECTOMY;  Surgeon: [name], MD;  Location: [facility];  Service: Gynecology;  Laterality: Bilateral;  ? THYMECTOMY  1999  ? TRANSVAGINAL TAPE (TVT) REMOVAL N/A 05/30/2018  ? Procedure: TRANSVAGINAL TAPE (TVT)  Revision;  Surgeon: [name], MD;  Location: [facility];  Service: Gynecology;  Laterality: N/A;  ? WISDOM TOOTH EXTRACTION    ? ? ?SOCIAL HISTORY: ?Social History  ? ?Socioeconomic History  ? Marital status: Married  ?  Spouse name: [name]  ? Number of children: 2  ? Years of education: 70  ? Highest education level: Bachelor's degree (e.g., BA, AB, BS)  ?Occupational History  ? Occupation: Retired  ?  Comment: [employer]  ?Tobacco Use  ? Smoking status: Never  ? Smokeless tobacco: Never  ?Vaping Use  ? Vaping Use: Never used  ?Substance and Sexual Activity  ? Alcohol use: Not Currently  ?  Alcohol/week: 0.0 standard  drinks  ? Drug use: Never  ? Sexual activity: Not Currently  ?  Partners: Male  ?  Birth control/protection: Surgical  ?  Comment: TAH--ovaries remain  ?Other Topics Concern  ? Not on file  ?Social History Narrative  ? Lives with husband  ? Caffeine- coffee, 2 cups daily  ? Right handed  ? ?Social Determinants of Health  ? ?Financial Resource Strain: Not on file  ?Food Insecurity: Not on file  ?Transportation Needs: Not on file  ?Physical Activity: Not on file  ?Stress: Not on file  ?Social Connections: Not on file  ?Intimate Partner Violence: Not on file  ? ? ?FAMILY HISTORY: ?Family History  ?Problem Relation Age of Onset  ? Hyperlipidemia Mother   ? Memory loss Mother   ?     90s; believed to be age-related memory decline  ? COPD Father   ?     dec age 17/s-smoked  ? Macular degeneration Father   ? Behavior problems Father   ?     at the end of life  ? Diabetes Brother   ? Macular degeneration Brother   ? Suicidality Brother   ? Diabetes Maternal Grandmother   ? Heart attack Paternal Grandfather   ? ? ?ALLERGIES:  is allergic to  ampicillin and penicillins. ? ?MEDICATIONS:  ?. ?Current Outpatient Medications:  ?  Ascorbic Acid (VITAMIN C) 1000 MG tablet, Take 1,000 mg by mouth 2 (two) times daily., Disp: , Rfl:  ?  aspirin EC 81 MG tablet, Take 81 mg

## 2022-01-19 ENCOUNTER — Other Ambulatory Visit: Payer: Self-pay

## 2022-01-19 DIAGNOSIS — C9001 Multiple myeloma in remission: Secondary | ICD-10-CM

## 2022-01-19 MED ORDER — LENALIDOMIDE 10 MG PO CAPS: ORAL_CAPSULE | ORAL | 0 refills | Status: DC

## 2022-01-19 NOTE — Telephone Encounter (Signed)
Patient is approved for Revlimid at no cost from [facility] 01/19/22-09/24/22 ? ?[facility] uses [place] ? ?Wynn Maudlin CPHT ?Specialty Pharmacy Patient Advocate ?[place] ?Phone [phone] ?Fax [phone] ?01/19/2022 1:32 PM ? ?

## 2022-01-20 ENCOUNTER — Other Ambulatory Visit: Payer: Self-pay

## 2022-01-20 DIAGNOSIS — C9001 Multiple myeloma in remission: Secondary | ICD-10-CM

## 2022-01-20 MED ORDER — LENALIDOMIDE 10 MG PO CAPS: ORAL_CAPSULE | ORAL | 0 refills | Status: DC

## 2022-01-25 ENCOUNTER — Other Ambulatory Visit: Payer: Self-pay

## 2022-01-25 DIAGNOSIS — C9001 Multiple myeloma in remission: Secondary | ICD-10-CM

## 2022-01-25 MED ORDER — LENALIDOMIDE 10 MG PO CAPS: ORAL_CAPSULE | ORAL | 0 refills | Status: DC

## 2022-02-09 ENCOUNTER — Other Ambulatory Visit: Payer: Self-pay | Admitting: Obstetrics and Gynecology

## 2022-02-09 DIAGNOSIS — N952 Postmenopausal atrophic vaginitis: Secondary | ICD-10-CM

## 2022-02-09 NOTE — Telephone Encounter (Signed)
Last AEX 02/24/21-scheduled for 03/08/22. Last mammo 12/1921.

## 2022-02-27 ENCOUNTER — Other Ambulatory Visit: Payer: Self-pay

## 2022-02-27 DIAGNOSIS — C9001 Multiple myeloma in remission: Secondary | ICD-10-CM

## 2022-02-27 MED ORDER — LENALIDOMIDE 10 MG PO CAPS: ORAL_CAPSULE | ORAL | 0 refills | Status: DC

## 2022-03-06 NOTE — Progress Notes (Unsigned)
66 y.o. G13P2002 Married Caucasian female here for annual breast and pelvic exam.    Normal bladder and bowel function.   Patient is using vaginal estrogen.  Using 1/2 gram pv at hs three times per week.   Just refilled her estrogen.  A tube usually last about 8 months.   PCP: [name], MD    Patient's last menstrual period was 09/26/1995 (within months).           Sexually active: No.  The current method of family planning is status post hysterectomy.    Exercising: No.  The patient does not participate in regular exercise at present. Smoker:  no  Health Maintenance: Pap:   02-24-21 LSIL, 03-22-17 ASCUS:Neg HR HPV History of abnormal Pap:  Yes, 03-14-21 colpo bx showed only atypia. 02-24-21 LSIL  MMG:  01-11-22 Neg/BiRads1 Colonoscopy: 07/2020 normal;next 5 years BMD: years ago  Result :Normal. Used Zometa in the past.   TDaP:  2018 Gardasil:   n/a HIV: neg in the past Hep C: neg in the past Screening Labs:  PCP.    reports that she has never smoked. She has never used smokeless tobacco. She reports that she does not currently use alcohol. She reports that she does not use drugs.  Past Medical History:  Diagnosis Date   Abdominal pain    Abnormal Pap smear of vagina 03/22/2017   ASCUS pap and negative HR HPV.  Patient is on immunosuppressive medication.    Allergic reaction to bee sting    Anemia    AR (aortic regurgitation) 11/30/2017   trace noted on ECHO   Atrial septal aneurysm per 11-30-17 echo   trivial pericardial effusion   Complicated migraine    Constipation    DDD (degenerative disc disease), cervical    DDD (degenerative disc disease), lumbosacral    Fatty liver    Female proctocele without uterine prolapse    Fibroid    reason for Hysterectomy   FTT (failure to thrive) in adult 08/09/2017   Gastroesophageal reflux disease without esophagitis 01/17/2017   Generalized anxiety disorder    Grade I diastolic dysfunction    Hammer toe 05/13/2018   Hematuria  11/08/2015   History of COVID-19 12/2020   History of stress incontinence    Hypergammaglobulinemia    Hyperlipidemia    Hypoxemia 12/16/2007   Insomnia    Leg length discrepancy    Loss of appetite    Lung nodule    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   Metastatic multiple myeloma to bone 10/19/2015   Metatarsalgia of right foot 07/15/2018   MR (mitral regurgitation) 11/30/2017   trace noted on ECHO   Muscular fasciculation    Nasal septal deviation    Nasal turbinate hypertrophy    Near syncope    Obstructive sleep apnea 12/03/2007   Other fatigue    Peripheral edema    Plasma cell dyscrasia    PONV (postoperative nausea and vomiting)    thinks morphine caused nausea   Postoperative urinary retention 05/14/2018   Prediabetes    Pure hypercholesterolemia    Rash 11/01/2015   Restless leg syndrome    S/P autologous bone marrow transplantation 03/29/2016   Shortness of breath 12/04/2007   TR (tricuspid regurgitation) 11/30/2017   trace noted on ECHO   Urinary incontinence    UTI (urinary tract infection)    Vaginal granulation tissue 12/03/2017   Vitamin D deficiency    Wears glasses     Past Surgical History:  Procedure Laterality Date   ABDOMINAL HYSTERECTOMY  1997   TAH--ovaries remain--Dr. Newton Pigg   ANTERIOR AND POSTERIOR REPAIR N/A 05/14/2018   Procedure: ANTERIOR (CYSTOCELE) AND POSTERIOR REPAIR (RECTOCELE);  Surgeon: [name], MD;  Location: [facility];  Service: Gynecology;  Laterality: N/A;   BLADDER SUSPENSION N/A 05/14/2018   Procedure: TRANSVAGINAL TAPE (TVT) PROCEDURE exact midurethral sling;  Surgeon: [name], MD;  Location: [facility];  Service: Gynecology;  Laterality: N/A;   BONE MARROW TRANSPLANT  2017   done at [place] N/A 05/14/2018   Procedure: CYSTOSCOPY;  Surgeon: [name], MD;  Location: [facility];  Service: Gynecology;  Laterality: N/A;    CYSTOSCOPY N/A 05/30/2018   Procedure: CYSTOSCOPY;  Surgeon: [name], MD;  Location: [facility];  Service: Gynecology;  Laterality: N/A;   ROBOTIC ASSISTED LAPAROSCOPIC SACROCOLPOPEXY N/A 05/14/2018   Procedure: XI ROBOTIC ASSISTED LAPAROSCOPIC SACROCOLPOPEXY;  Surgeon: [name], MD;  Location: [facility];  Service: Gynecology;  Laterality: N/A;  6 hours OR time. Need extended stay recovery bed.   SALPINGOOPHORECTOMY Bilateral 05/14/2018   Procedure: SALPINGO OOPHORECTOMY;  Surgeon: [name], MD;  Location: [facility];  Service: Gynecology;  Laterality: Bilateral;   THYMECTOMY  1999   TRANSVAGINAL TAPE (TVT) REMOVAL N/A 05/30/2018   Procedure: TRANSVAGINAL TAPE (TVT)  Revision;  Surgeon: [name], MD;  [facility]: [facility];  Service: Gynecology;  Laterality: N/A;   WISDOM TOOTH EXTRACTION      Current Outpatient Medications  Medication Sig Dispense Refill   Ascorbic Acid (VITAMIN C) 1000 MG tablet Take 1,000 mg by mouth 2 (two) times daily.     aspirin EC 81 MG tablet Take 81 mg by mouth daily.     b complex vitamins capsule Take 1 capsule by mouth daily.     Cholecalciferol (VITAMIN D3) 30 MCG/15ML LIQD Take by mouth. Pt. Takes 5000 IU daily     Cyanocobalamin (B-12) 1000 MCG CAPS      EPINEPHrine 0.3 mg/0.3 mL IJ SOAJ injection INJECT INTRAMUSCULARLLY AS DIRECTED AS NEEDED FOR ANAPHYLAXIS     lenalidomide (REVLIMID) 10 MG capsule TAKE ONE CAPSULE BY MOUTH DAILY FOR 21 DAYS, THEN 7 DAYS OFF 21 capsule 0   Magnesium 400 MG CAPS Take by mouth.     estradiol (ESTRACE) 0.1 MG/GM vaginal cream Place 1 gram per vagina at bedtime twice a week. 43 g 0   No current facility-administered medications for this visit.    Family History  Problem Relation Age of Onset   Hyperlipidemia Mother    Memory loss Mother        87s; believed to be age-related memory decline   COPD Father        dec age 53/s-smoked    Macular degeneration Father    Behavior problems Father        at the end of life   Diabetes Brother    Macular degeneration Brother    Suicidality Brother    Diabetes Maternal Grandmother    Heart attack Paternal Grandfather     Review of Systems  All other systems reviewed and are negative.   Exam:   BP 110/78   Pulse 70   Ht 5' 7.5" (1.715 m)   Wt 138 lb (62.6 kg)   LMP 09/26/1995 (  Within Months)   SpO2 98%   BMI 21.29 kg/m     General appearance: alert, cooperative and appears stated age Head: normocephalic, without obvious abnormality, atraumatic Neck: no adenopathy, supple, symmetrical, trachea midline and thyroid normal to inspection and palpation Lungs: clear to auscultation bilaterally Breasts: normal appearance, no masses or tenderness, No nipple retraction or dimpling, No nipple discharge or bleeding, No axillary adenopathy Heart: regular rate and rhythm Abdomen: soft, non-tender; no masses, no organomegaly Extremities: extremities normal, atraumatic, no cyanosis or edema Skin: skin color, texture, turgor normal. No rashes or lesions Lymph nodes: cervical, supraclavicular, and axillary nodes normal. Neurologic: grossly normal  Pelvic: External genitalia:  no lesions              No abnormal inguinal nodes palpated.              Urethra:  normal appearing urethra with no masses, tenderness or lesions              Bartholins and Skenes: normal                 Vagina: signs of atrophy noted with placing the speculum.  Atrophy at vaginal apex and left vaginal apical sidewall.  Good support noted.               Cervix: absent              Pap taken: yes Bimanual Exam:  Uterus:  absent              Adnexa: no mass, fullness, tenderness              Rectal exam: yes.  Confirms.              Anus:  normal sphincter tone, no lesions  Chaperone was present for exam:  Estill Bamberg, CMA  Assessment:   Well woman visit with gynecologic exam. High risk Medicare GYN exam.   Status post TAH.  Status post robotically assisted bilateral salpingo-oophorectomy, sacral colpopexy, anterior and posterior colporrhaphy, TVT Exact midurethral sling and cystoscopy. Status post revision of midurethral sling. History of abnormal paps. Vaginal atrophy. Encounter for medication monitoring.  Hx multiple myeloma. Hx murmur.  Hx aortic regurgitation.   Plan: Mammogram screening discussed. Self breast awareness reviewed. Pap and HR HPV collected. Guidelines for Calcium, Vitamin D, regular exercise program including cardiovascular and weight bearing exercise. Increase vaginal estrogen to 1 gram pv at hs twice weekly.  Follow up annually and prn.   After visit summary provided.   20 min  total time was spent for this patient encounter, including preparation, face-to-face counseling with the patient, coordination of care, and documentation of the encounter.

## 2022-03-08 ENCOUNTER — Encounter: Payer: Self-pay | Admitting: Obstetrics and Gynecology

## 2022-03-08 ENCOUNTER — Ambulatory Visit (INDEPENDENT_AMBULATORY_CARE_PROVIDER_SITE_OTHER): Payer: Medicare Other | Admitting: Obstetrics and Gynecology

## 2022-03-08 ENCOUNTER — Other Ambulatory Visit (HOSPITAL_COMMUNITY)
Admission: RE | Admit: 2022-03-08 | Discharge: 2022-03-08 | Disposition: A | Discharge status: Home / Self Care | Payer: Medicare Other | Source: Ambulatory Visit | Attending: Obstetrics and Gynecology | Admitting: Obstetrics and Gynecology

## 2022-03-08 VITALS — BP 110/78 | HR 70 | Ht 67.5 in | Wt 138.0 lb

## 2022-03-08 DIAGNOSIS — N952 Postmenopausal atrophic vaginitis: Secondary | ICD-10-CM

## 2022-03-08 DIAGNOSIS — R87622 Low grade squamous intraepithelial lesion on cytologic smear of vagina (LGSIL): Secondary | ICD-10-CM | POA: Insufficient documentation

## 2022-03-08 DIAGNOSIS — Z01411 Encounter for gynecological examination (general) (routine) with abnormal findings: Secondary | ICD-10-CM | POA: Diagnosis not present

## 2022-03-08 DIAGNOSIS — Z5181 Encounter for therapeutic drug level monitoring: Secondary | ICD-10-CM | POA: Diagnosis not present

## 2022-03-08 DIAGNOSIS — Z1272 Encounter for screening for malignant neoplasm of vagina: Secondary | ICD-10-CM

## 2022-03-08 DIAGNOSIS — Z9189 Other specified personal risk factors, not elsewhere classified: Secondary | ICD-10-CM

## 2022-03-08 DIAGNOSIS — Z01419 Encounter for gynecological examination (general) (routine) without abnormal findings: Secondary | ICD-10-CM

## 2022-03-08 MED ORDER — ESTRADIOL 0.1 MG/GM VA CREA: TOPICAL_CREAM | VAGINAL | 0 refills | Status: DC

## 2022-03-08 NOTE — Patient Instructions (Signed)
EXERCISE AND DIET:  We recommended that you start or continue a regular exercise program for good health. Regular exercise means any activity that makes your heart beat faster and makes you sweat.  We recommend exercising at least 30 minutes per day at least 3 days a week, preferably 4 or 5.  We also recommend a diet low in fat and sugar.  Inactivity, poor dietary choices and obesity can cause diabetes, heart attack, stroke, and kidney damage, among others.    ALCOHOL AND SMOKING:  Women should limit their alcohol intake to no more than 7 drinks/beers/glasses of wine (combined, not each!) per week. Moderation of alcohol intake to this level decreases your risk of breast cancer and liver damage. And of course, no recreational drugs are part of a healthy lifestyle.  And absolutely no smoking or even second hand smoke. Most people know smoking can cause heart and lung diseases, but did you know it also contributes to weakening of your bones? Aging of your skin?  Yellowing of your teeth and nails?  CALCIUM AND VITAMIN D:  Adequate intake of calcium and Vitamin D are recommended.  The recommendations for exact amounts of these supplements seem to change often, but generally speaking 600 mg of calcium (either carbonate or citrate) and 800 units of Vitamin D per day seems prudent. Certain women may benefit from higher intake of Vitamin D.  If you are among these women, your doctor will have told you during your visit.    PAP SMEARS:  Pap smears, to check for cervical cancer or precancers,  have traditionally been done yearly, although recent scientific advances have shown that most women can have pap smears less often.  However, every woman still should have a physical exam from her gynecologist every year. It will include a breast check, inspection of the vulva and vagina to check for abnormal growths or skin changes, a visual exam of the cervix, and then an exam to evaluate the size and shape of the uterus and  ovaries.  And after 68 years of age, a rectal exam is indicated to check for rectal cancers. We will also provide age appropriate advice regarding health maintenance, like when you should have certain vaccines, screening for sexually transmitted diseases, bone density testing, colonoscopy, mammograms, etc.   MAMMOGRAMS:  All women over 40 years old should have a yearly mammogram. Many facilities now offer a "3D" mammogram, which may cost around $50 extra out of pocket. If possible,  we recommend you accept the option to have the 3D mammogram performed.  It both reduces the number of women who will be called back for extra views which then turn out to be normal, and it is better than the routine mammogram at detecting truly abnormal areas.    COLONOSCOPY:  Colonoscopy to screen for colon cancer is recommended for all women at age 50.  We know, you hate the idea of the prep.  We agree, BUT, having colon cancer and not knowing it is worse!!  Colon cancer so often starts as a polyp that can be seen and removed at colonscopy, which can quite literally save your life!  And if your first colonoscopy is normal and you have no family history of colon cancer, most women don't have to have it again for 10 years.  Once every ten years, you can do something that may end up saving your life, right?  We will be happy to help you get it scheduled when you are ready.    Be sure to check your insurance coverage so you understand how much it will cost.  It may be covered as a preventative service at no cost, but you should check your particular policy.    Calcium Content in Foods Calcium is the most abundant mineral in the body. Most of the body's calcium supply is stored in bones and teeth. Calcium helps many parts of the body function normally, including: Blood and blood vessels. Nerves. Hormones. Muscles. Bones and teeth. When your calcium stores are low, you may be at risk for low bone mass, bone loss, and broken bones  (fractures). When you get enough calcium, it helps to support strong bones and teeth throughout your life. Calcium is especially important for: Children during growth spurts. Girls during adolescence. Women who are pregnant or breastfeeding. Women after their menstrual cycle stops (postmenopause). Women whose menstrual cycle has stopped due to anorexia nervosa or regular intense exercise. People who cannot eat or digest dairy products. Vegans. Recommended daily amounts of calcium: Women (ages 19 to 50): 1,000 mg per day. Women (ages 51 and older): 1,200 mg per day. Men (ages 19 to 70): 1,000 mg per day. Men (ages 71 and older): 1,200 mg per day. Women (ages 9 to 18): 1,300 mg per day. Men (ages 9 to 18): 1,300 mg per day. General information Eat foods that are high in calcium. Try to get most of your calcium from food. Some people may benefit from taking calcium supplements. Check with your health care provider or diet and nutrition specialist (dietitian) before starting any calcium supplements. Calcium supplements may interact with certain medicines. Too much calcium may cause other health problems, such as constipation and kidney stones. For the body to absorb calcium, it needs vitamin D. Sources of vitamin D include: Skin exposure to direct sunlight. Foods, such as egg yolks, liver, mushrooms, saltwater fish, and fortified milk. Vitamin D supplements. Check with your health care provider or dietitian before starting any vitamin D supplements. What foods are high in calcium?  Foods that are high in calcium contain more than 100 milligrams per serving. Fruits Fortified orange juice or other fruit juice, 300 mg per 8 oz serving. Vegetables Collard greens, 360 mg per 8 oz serving. Kale, 100 mg per 8 oz serving. Bok choy, 160 mg per 8 oz serving. Grains Fortified ready-to-eat cereals, 100 to 1,000 mg per 8 oz serving. Fortified frozen waffles, 200 mg in 2 waffles. Oatmeal, 140 mg in  1 cup. Meats and other proteins Sardines, canned with bones, 325 mg per 3 oz serving. Salmon, canned with bones, 180 mg per 3 oz serving. Canned shrimp, 125 mg per 3 oz serving. Baked beans, 160 mg per 4 oz serving. Tofu, firm, made with calcium sulfate, 253 mg per 4 oz serving. Dairy Yogurt, plain, low-fat, 310 mg per 6 oz serving. Nonfat milk, 300 mg per 8 oz serving. American cheese, 195 mg per 1 oz serving. Cheddar cheese, 205 mg per 1 oz serving. Cottage cheese 2%, 105 mg per 4 oz serving. Fortified soy, rice, or almond milk, 300 mg per 8 oz serving. Mozzarella, part skim, 210 mg per 1 oz serving. The items listed above may not be a complete list of foods high in calcium. Actual amounts of calcium may be different depending on processing. Contact a dietitian for more information. What foods are lower in calcium? Foods that are lower in calcium contain 50 mg or less per serving. Fruits Apple, about 6 mg. Banana, about 12 mg.   Vegetables Lettuce, 19 mg per 2 oz serving. Tomato, about 11 mg. Grains Rice, 4 mg per 6 oz serving. Boiled potatoes, 14 mg per 8 oz serving. White bread, 6 mg per slice. Meats and other proteins Egg, 27 mg per 2 oz serving. Red meat, 7 mg per 4 oz serving. Chicken, 17 mg per 4 oz serving. Fish, cod, or trout, 20 mg per 4 oz serving. Dairy Cream cheese, regular, 14 mg per 1 Tbsp serving. Brie cheese, 50 mg per 1 oz serving. Parmesan cheese, 70 mg per 1 Tbsp serving. The items listed above may not be a complete list of foods lower in calcium. Actual amounts of calcium may be different depending on processing. Contact a dietitian for more information. Summary Calcium is an important mineral in the body because it affects many functions. Getting enough calcium helps support strong bones and teeth throughout your life. Try to get most of your calcium from food. Calcium supplements may interact with certain medicines. Check with your health care provider  or dietitian before starting any calcium supplements. This information is not intended to replace advice given to you by your health care provider. Make sure you discuss any questions you have with your health care provider. Document Revised: 01/07/2020 Document Reviewed: 01/07/2020 [employer] Patient Education  2023 [employer]  

## 2022-03-10 LAB — CYTOLOGY - PAP
Comment: NEGATIVE
Diagnosis: UNDETERMINED — AB
High risk HPV: NEGATIVE

## 2022-03-30 ENCOUNTER — Other Ambulatory Visit: Payer: Self-pay

## 2022-03-30 DIAGNOSIS — C9001 Multiple myeloma in remission: Secondary | ICD-10-CM

## 2022-03-30 MED ORDER — LENALIDOMIDE 10 MG PO CAPS: ORAL_CAPSULE | ORAL | 0 refills | Status: DC

## 2022-04-19 ENCOUNTER — Other Ambulatory Visit: Payer: Self-pay

## 2022-04-19 DIAGNOSIS — C9001 Multiple myeloma in remission: Secondary | ICD-10-CM

## 2022-04-21 ENCOUNTER — Inpatient Hospital Stay: Payer: Medicare Other

## 2022-04-21 ENCOUNTER — Other Ambulatory Visit: Payer: Self-pay

## 2022-04-21 ENCOUNTER — Inpatient Hospital Stay: Payer: Medicare Other | Attending: Hematology | Admitting: Hematology

## 2022-04-21 VITALS — BP 108/72 | HR 85 | Temp 97.9°F | Resp 17 | Ht 67.5 in | Wt 136.6 lb

## 2022-04-21 DIAGNOSIS — C9001 Multiple myeloma in remission: Secondary | ICD-10-CM

## 2022-04-21 DIAGNOSIS — Z7982 Long term (current) use of aspirin: Secondary | ICD-10-CM | POA: Diagnosis not present

## 2022-04-21 DIAGNOSIS — Z8349 Family history of other endocrine, nutritional and metabolic diseases: Secondary | ICD-10-CM | POA: Diagnosis not present

## 2022-04-21 DIAGNOSIS — Z8249 Family history of ischemic heart disease and other diseases of the circulatory system: Secondary | ICD-10-CM | POA: Insufficient documentation

## 2022-04-21 DIAGNOSIS — Z833 Family history of diabetes mellitus: Secondary | ICD-10-CM | POA: Insufficient documentation

## 2022-04-21 DIAGNOSIS — E559 Vitamin D deficiency, unspecified: Secondary | ICD-10-CM | POA: Diagnosis not present

## 2022-04-21 DIAGNOSIS — Z79899 Other long term (current) drug therapy: Secondary | ICD-10-CM | POA: Insufficient documentation

## 2022-04-21 DIAGNOSIS — Z83518 Family history of other specified eye disorder: Secondary | ICD-10-CM | POA: Diagnosis not present

## 2022-04-21 DIAGNOSIS — Z836 Family history of other diseases of the respiratory system: Secondary | ICD-10-CM | POA: Diagnosis not present

## 2022-04-21 LAB — CBC WITH DIFFERENTIAL (CANCER CENTER ONLY)
Abs Immature Granulocytes: 0.01 10*3/uL (ref 0.00–0.07)
Basophils Absolute: 0.1 10*3/uL (ref 0.0–0.1)
Basophils Relative: 1 %
Eosinophils Absolute: 0.2 10*3/uL (ref 0.0–0.5)
Eosinophils Relative: 4 %
HCT: 40 % (ref 36.0–46.0)
Hemoglobin: 13.7 g/dL (ref 12.0–15.0)
Immature Granulocytes: 0 %
Lymphocytes Relative: 18 %
Lymphs Abs: 0.8 10*3/uL (ref 0.7–4.0)
MCH: 32.2 pg (ref 26.0–34.0)
MCHC: 34.3 g/dL (ref 30.0–36.0)
MCV: 93.9 fL (ref 80.0–100.0)
Monocytes Absolute: 0.6 10*3/uL (ref 0.1–1.0)
Monocytes Relative: 14 %
Neutro Abs: 2.6 10*3/uL (ref 1.7–7.7)
Neutrophils Relative %: 63 %
Platelet Count: 314 10*3/uL (ref 150–400)
RBC: 4.26 MIL/uL (ref 3.87–5.11)
RDW: 14.6 % (ref 11.5–15.5)
WBC Count: 4.2 10*3/uL (ref 4.0–10.5)
nRBC: 0 % (ref 0.0–0.2)

## 2022-04-21 LAB — CMP (CANCER CENTER ONLY)
ALT: 21 U/L (ref 0–44)
AST: 19 U/L (ref 15–41)
Albumin: 4.3 g/dL (ref 3.5–5.0)
Alkaline Phosphatase: 74 U/L (ref 38–126)
Anion gap: 6 (ref 5–15)
BUN: 22 mg/dL (ref 8–23)
CO2: 28 mmol/L (ref 22–32)
Calcium: 9.4 mg/dL (ref 8.9–10.3)
Chloride: 104 mmol/L (ref 98–111)
Creatinine: 1.24 mg/dL — ABNORMAL HIGH (ref 0.44–1.00)
GFR, Estimated: 48 mL/min — ABNORMAL LOW (ref >60)
Glucose, Bld: 99 mg/dL (ref 70–99)
Potassium: 4.3 mmol/L (ref 3.5–5.1)
Sodium: 138 mmol/L (ref 135–145)
Total Bilirubin: 1.1 mg/dL (ref 0.3–1.2)
Total Protein: 7.6 g/dL (ref 6.5–8.1)

## 2022-04-21 LAB — VITAMIN D 25 HYDROXY (VIT D DEFICIENCY, FRACTURES): Vit D, 25-Hydroxy: 51.93 ng/mL (ref 30–100)

## 2022-04-21 NOTE — Progress Notes (Signed)
HEMATOLOGY/ONCOLOGY CLINIC NOTE  Date of Service: 04/21/2022   PCP: [name], MD  CHIEF COMPLAINT: Follow-up for continued evaluation and management of multiple myeloma.  CHIEF COMPLAINTS/PURPOSE OF CONSULTATION:  Follow-up for continued evaluation and management of multiple myeloma  DIAGNOSIS   IgA lambda R-ISS stage II multiple myeloma with innumerable lytic lesions in the calvarium, lesion in T7 vertebra and multiple lesions in the pelvis.  No significant bone pain at this time. Diagnosed in January 2017.   Current treatment   Revlimid 88m po daily 3 weeks on 1 week off (after treatment interruption)  Previous Treatment   Status post Vd x 1 cycle VRd x 6 cycles   HD Melphalan 2035mm2 on 03/22/2016 Autologous HSCT on 03/23/2016 [facility]10^6[facility])   Current rx -Revlimid maintenance    INTERVAL HISTORY:  Paula Johnson a [age].o. female here for continued valuation of multiple myeloma. She continues to be on maintenance Revlimid without any new toxicities. Intermittent grade 1 muscle cramping. She reports She is doing well with no new symptoms or concerns.  She notes she is up to date with her colonoscopy. Last one was 1 year ago.  No fevers no chills no night sweats.   No new focal bone pains. No new skin rashes. No other new or acute focal symptoms.  Patient notes that she is scheduled for another repeat bone marrow biopsy in about 3 months.  Labs done today reviewed in detail with the patient.  REVIEW OF SYSTEMS:   10 Point review of Systems was done is negative except as noted above.  PHYSICAL EXAMINATION: .BP 108/72 (BP Location: Left Arm, Patient Position: Sitting)   Pulse 85   Temp 97.9 F (36.6 C) (Temporal)   Resp 17   Ht 5' 7.5" (1.715 m)   Wt 136 lb 9.6 oz (62 kg)   LMP 09/26/1995 (Within Months)   SpO2 98%   BMI 21.08 kg/m  NAD GENERAL:alert, in no acute distress and comfortable SKIN: no acute rashes,  no significant lesions EYES: conjunctiva are pink and non-injected, sclera anicteric NECK: supple, no JVD LYMPH:  no palpable lymphadenopathy in the cervical, axillary or inguinal regions LUNGS: clear to auscultation b/l with normal respiratory effort HEART: regular rate & rhythm ABDOMEN:  normoactive bowel sounds , non tender, not distended. Extremity: no pedal edema PSYCH: alert & oriented x 3 with fluent speech NEURO: no focal motor/sensory deficits   MEDICAL HISTORY:   Past Medical History:  Diagnosis Date   Abdominal pain    Abnormal Pap smear of vagina 03/22/2017   ASCUS pap and negative HR HPV.  Patient is on immunosuppressive medication.    Allergic reaction to bee sting    Anemia    AR (aortic regurgitation) 11/30/2017   trace noted on ECHO   Atrial septal aneurysm per 11-30-17 echo   trivial pericardial effusion   Complicated migraine    Constipation    DDD (degenerative disc disease), cervical    DDD (degenerative disc disease), lumbosacral    Fatty liver    Female proctocele without uterine prolapse    Fibroid    reason for Hysterectomy   FTT (failure to thrive) in adult 08/09/2017   Gastroesophageal reflux disease without esophagitis 01/17/2017   Generalized anxiety disorder    Grade I diastolic dysfunction    Hammer toe 05/13/2018   Hematuria 11/08/2015   History of COVID-19 12/2020   History of stress incontinence    Hypergammaglobulinemia  Hyperlipidemia    Hypoxemia 12/16/2007   Insomnia    Leg length discrepancy    Loss of appetite    Lung nodule    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   Metastatic multiple myeloma to bone 10/19/2015   Metatarsalgia of right foot 07/15/2018   MR (mitral regurgitation) 11/30/2017   trace noted on ECHO   Muscular fasciculation    Nasal septal deviation    Nasal turbinate hypertrophy    Near syncope    Obstructive sleep apnea 12/03/2007   Other fatigue    Peripheral edema    Plasma cell dyscrasia    PONV  (postoperative nausea and vomiting)    thinks morphine caused nausea   Postoperative urinary retention 05/14/2018   Prediabetes    Pure hypercholesterolemia    Rash 11/01/2015   Restless leg syndrome    S/P autologous bone marrow transplantation 03/29/2016   Shortness of breath 12/04/2007   TR (tricuspid regurgitation) 11/30/2017   trace noted on ECHO   Urinary incontinence    UTI (urinary tract infection)    Vaginal granulation tissue 12/03/2017   Vitamin D deficiency    Wears glasses     SURGICAL HISTORY: Past Surgical History:  Procedure Laterality Date   ABDOMINAL HYSTERECTOMY  1997   TAH--ovaries remain--Dr. Newton Pigg   ANTERIOR AND POSTERIOR REPAIR N/A 05/14/2018   Procedure: ANTERIOR (CYSTOCELE) AND POSTERIOR REPAIR (RECTOCELE);  Surgeon: [name], MD;  Location: [facility];  Service: Gynecology;  Laterality: N/A;   BLADDER SUSPENSION N/A 05/14/2018   Procedure: TRANSVAGINAL TAPE (TVT) PROCEDURE exact midurethral sling;  Surgeon: [name], MD;  Location: [facility];  Service: Gynecology;  Laterality: N/A;   BONE MARROW TRANSPLANT  2017   done at [facility] N/A 05/14/2018   Procedure: CYSTOSCOPY;  Surgeon: [name], MD;  Location: [facility];  Service: Gynecology;  Laterality: N/A;   CYSTOSCOPY N/A 05/30/2018   Procedure: CYSTOSCOPY;  Surgeon: [name], MD;  Location: [facility];  Service: Gynecology;  Laterality: N/A;   ROBOTIC ASSISTED LAPAROSCOPIC SACROCOLPOPEXY N/A 05/14/2018   Procedure: XI ROBOTIC ASSISTED LAPAROSCOPIC SACROCOLPOPEXY;  Surgeon: [name], MD;  Location: [facility];  Service: Gynecology;  Laterality: N/A;  6 hours OR time. Need extended stay recovery bed.   SALPINGOOPHORECTOMY Bilateral 05/14/2018   Procedure: SALPINGO OOPHORECTOMY;  Surgeon: [name], MD;  Location: [facility];  Service: Gynecology;  Laterality:  Bilateral;   THYMECTOMY  1999   TRANSVAGINAL TAPE (TVT) REMOVAL N/A 05/30/2018   Procedure: TRANSVAGINAL TAPE (TVT)  Revision;  Surgeon: [name], MD;  Location: [facility];  Service: Gynecology;  Laterality: N/A;   WISDOM TOOTH EXTRACTION      SOCIAL HISTORY: Social History   Socioeconomic History   Marital status: Married    Spouse name: Paula Johnson   Number of children: 2   Years of education: 16   Highest education level: Bachelor's degree (e.g., BA, AB, BS)  Occupational History   Occupation: Retired    Comment: Merchandiser, retail [employer]  Tobacco Use   Smoking status: Never   Smokeless tobacco: Never  Vaping Use   Vaping Use: Never used  Substance and Sexual Activity   Alcohol use: Not Currently    Alcohol/week: 0.0 standard drinks of alcohol   Drug use: Never  Sexual activity: Not Currently    Partners: Male    Birth control/protection: Surgical    Comment: TAH--ovaries remain  Other Topics Concern   Not on file  Social History Narrative   Lives with husband   Caffeine- coffee, 2 cups daily   Right handed   Social Determinants of Health   Financial Resource Strain: Not on file  Food Insecurity: Not on file  Transportation Needs: Not on file  Physical Activity: Not on file  Stress: Not on file  Social Connections: Not on file  Intimate Partner Violence: Not on file    FAMILY HISTORY: Family History  Problem Relation Age of Onset   Hyperlipidemia Mother    Memory loss Mother        86s; believed to be age-related memory decline   COPD Father        dec age 67/s-smoked   Macular degeneration Father    Behavior problems Father        at the end of life   Diabetes Brother    Macular degeneration Brother    Suicidality Brother    Diabetes Maternal Grandmother    Heart attack Paternal Grandfather     ALLERGIES:  is allergic to ampicillin and penicillins.  MEDICATIONS:  . Current Outpatient Medications:    Ascorbic  Acid (VITAMIN C) 1000 MG tablet, Take 1,000 mg by mouth 2 (two) times daily., Disp: , Rfl:    aspirin EC 81 MG tablet, Take 81 mg by mouth daily., Disp: , Rfl:    b complex vitamins capsule, Take 1 capsule by mouth daily., Disp: , Rfl:    Cholecalciferol (VITAMIN D3) 30 MCG/15ML LIQD, Take by mouth. Pt. Takes 5000 IU daily, Disp: , Rfl:    Cyanocobalamin (B-12) 1000 MCG CAPS, , Disp: , Rfl:    EPINEPHrine 0.3 mg/0.3 mL IJ SOAJ injection, INJECT INTRAMUSCULARLLY AS DIRECTED AS NEEDED FOR ANAPHYLAXIS, Disp: , Rfl:    estradiol (ESTRACE) 0.1 MG/GM vaginal cream, Place 1 gram per vagina at bedtime twice a week., Disp: 43 g, Rfl: 0   lenalidomide (REVLIMID) 10 MG capsule, TAKE ONE CAPSULE BY MOUTH DAILY FOR 21 DAYS, THEN 7 DAYS OFF, Disp: 21 capsule, Rfl: 0   Magnesium 400 MG CAPS, Take by mouth., Disp: , Rfl:    LABORATORY DATA:  I have reviewed the data as listed  .    Latest Ref Rng & Units 04/21/2022    1:24 PM 01/16/2022    1:50 PM 10/10/2021    2:11 PM  CBC  WBC 4.0 - 10.5 K/uL 4.2  3.6  4.4   Hemoglobin 12.0 - 15.0 g/dL 13.7  12.3  12.5   Hematocrit 36.0 - 46.0 % 40.0  37.2  37.7   Platelets 150 - 400 K/uL 314  280  296     .    Latest Ref Rng & Units 01/16/2022    1:50 PM 10/10/2021    2:11 PM 07/08/2021   11:32 AM  CMP  Glucose 70 - 99 mg/dL 86  83  84   BUN 8 - 23 mg/dL 16  21  20    Creatinine 0.44 - 1.00 mg/dL 0.84  0.90  0.83   Sodium 135 - 145 mmol/L 141  141  136   Potassium 3.5 - 5.1 mmol/L 3.5  4.1  4.4   Chloride 98 - 111 mmol/L 103  106  102   CO2 22 - 32 mmol/L 29  27  26    Calcium 8.9 -  10.3 mg/dL 9.6  9.1  9.3   Total Protein 6.5 - 8.1 g/dL 7.6  7.1  8.0   Total Bilirubin 0.3 - 1.2 mg/dL 0.9  0.7  1.0   Alkaline Phos 38 - 126 U/L 81  71  75   AST 15 - 41 U/L 17  16  22    ALT 0 - 44 U/L 19  19  25       10/10/2019 Flow Cytometry (Repeat BM Bx from [facility]):   10/07/18 Repeat BM Bx from [facility]:    RADIOGRAPHIC STUDIES: I have personally reviewed the  radiological images as listed and agreed with the findings in the report. No results found.  ASSESSMENT & PLAN:   66 y.o. caucasian female with  1. H/o Multiple myeloma - currently in remission.  IgA lambda R-ISS stage II multiple myeloma with innumerable lytic lesions in the calvarium, lesion in T7 vertebra and multiple lesions in the pelvis.  No significant bone pain at this time. Diagnosed in January 2017.  s/p treatment as noted above 10/07/18 BM Bx and Flow cytometry indicate that the pt continues to be in CR 10/10/2019 Flow Cytometry revealed "A. Bone Marrow (Plasma Cell Myeloma Minimal Residual Disease Detection by Flow Cytometry): Negative. No phenotypically abnormal plasma cells at or above the limit of detection identified."  PLAN:  -Labs done today were reviewed in detail with the patient.  CBC and CMP are stable. Myeloma panel shows no monoclonal protein spike.*** Patient notes that she is scheduled for another repeat bone marrow biopsy in about 3 months. -Vitamin D levels *** -Continue Vitamin D 5000 IU 5x per week and 10000 IU 2x per week  -Continue aspirin 81 mg p.o. daily  FOLLOW UP:  RTC with Dr Irene Limbo with labs in 14 weeks  The total time spent in the appointment was *** minutes*.  All of the patient's questions were answered with apparent satisfaction. The patient knows to call the clinic with any problems, questions or concerns.   Sullivan Lone MD MS AAHIVMS [facility] Hematology/Oncology Physician [facility]  .*Total Encounter Time as defined by the Centers for Medicare and Medicaid Services includes, in addition to the face-to-face time of a patient visit (documented in the note above) non-face-to-face time: obtaining and reviewing outside history, ordering and reviewing medications, tests or procedures, care coordination (communications with other health care professionals or caregivers) and documentation in the medical record.  I, Paula Johnson, am  acting as scribe for Dr. Sullivan Lone, MD.

## 2022-04-24 ENCOUNTER — Encounter: Payer: Self-pay | Admitting: Hematology

## 2022-04-25 LAB — MULTIPLE MYELOMA PANEL, SERUM
Albumin SerPl Elph-Mcnc: 3.8 g/dL (ref 2.9–4.4)
Albumin/Glob SerPl: 1.2 (ref 0.7–1.7)
Alpha 1: 0.2 g/dL (ref 0.0–0.4)
Alpha2 Glob SerPl Elph-Mcnc: 0.7 g/dL (ref 0.4–1.0)
B-Globulin SerPl Elph-Mcnc: 1.1 g/dL (ref 0.7–1.3)
Gamma Glob SerPl Elph-Mcnc: 1.3 g/dL (ref 0.4–1.8)
Globulin, Total: 3.2 g/dL (ref 2.2–3.9)
IgA: 538 mg/dL — ABNORMAL HIGH (ref 87–352)
IgG (Immunoglobin G), Serum: 1206 mg/dL (ref 586–1602)
IgM (Immunoglobulin M), Srm: 46 mg/dL (ref 26–217)
Total Protein ELP: 7 g/dL (ref 6.0–8.5)

## 2022-04-26 ENCOUNTER — Telehealth: Payer: Self-pay | Admitting: Hematology

## 2022-04-26 NOTE — Telephone Encounter (Signed)
Scheduled follow-up appointment per 7/28 los. Patient is aware. 

## 2022-05-08 ENCOUNTER — Other Ambulatory Visit: Payer: Self-pay

## 2022-05-08 DIAGNOSIS — C9001 Multiple myeloma in remission: Secondary | ICD-10-CM

## 2022-05-08 MED ORDER — LENALIDOMIDE 10 MG PO CAPS: ORAL_CAPSULE | ORAL | 0 refills | Status: DC

## 2022-05-31 ENCOUNTER — Other Ambulatory Visit: Payer: Self-pay

## 2022-05-31 DIAGNOSIS — C9001 Multiple myeloma in remission: Secondary | ICD-10-CM

## 2022-05-31 MED ORDER — LENALIDOMIDE 10 MG PO CAPS: ORAL_CAPSULE | ORAL | 0 refills | Status: DC

## 2022-06-09 ENCOUNTER — Other Ambulatory Visit: Payer: Self-pay | Admitting: Obstetrics and Gynecology

## 2022-06-09 DIAGNOSIS — N952 Postmenopausal atrophic vaginitis: Secondary | ICD-10-CM

## 2022-06-09 NOTE — Telephone Encounter (Signed)
Last annual exam 02/2022 Mammogram 12/2021

## 2022-06-28 ENCOUNTER — Other Ambulatory Visit: Payer: Self-pay

## 2022-06-28 DIAGNOSIS — C9001 Multiple myeloma in remission: Secondary | ICD-10-CM

## 2022-06-28 MED ORDER — LENALIDOMIDE 10 MG PO CAPS: ORAL_CAPSULE | ORAL | 0 refills | Status: DC

## 2022-07-25 ENCOUNTER — Other Ambulatory Visit: Payer: Self-pay

## 2022-07-25 DIAGNOSIS — C9001 Multiple myeloma in remission: Secondary | ICD-10-CM

## 2022-07-26 ENCOUNTER — Other Ambulatory Visit: Payer: Self-pay

## 2022-07-26 ENCOUNTER — Inpatient Hospital Stay: Payer: Medicare Other

## 2022-07-26 ENCOUNTER — Inpatient Hospital Stay: Payer: Medicare Other | Attending: Hematology | Admitting: Hematology

## 2022-07-26 VITALS — BP 124/76 | HR 83 | Temp 98.3°F | Resp 16 | Ht 67.5 in | Wt 132.6 lb

## 2022-07-26 DIAGNOSIS — Z7982 Long term (current) use of aspirin: Secondary | ICD-10-CM | POA: Insufficient documentation

## 2022-07-26 DIAGNOSIS — Z79899 Other long term (current) drug therapy: Secondary | ICD-10-CM | POA: Insufficient documentation

## 2022-07-26 DIAGNOSIS — C9001 Multiple myeloma in remission: Secondary | ICD-10-CM | POA: Insufficient documentation

## 2022-07-26 DIAGNOSIS — Z5112 Encounter for antineoplastic immunotherapy: Secondary | ICD-10-CM | POA: Insufficient documentation

## 2022-07-26 LAB — CMP (CANCER CENTER ONLY)
ALT: 17 U/L (ref 0–44)
AST: 16 U/L (ref 15–41)
Albumin: 4.3 g/dL (ref 3.5–5.0)
Alkaline Phosphatase: 75 U/L (ref 38–126)
Anion gap: 4 — ABNORMAL LOW (ref 5–15)
BUN: 15 mg/dL (ref 8–23)
CO2: 30 mmol/L (ref 22–32)
Calcium: 9.6 mg/dL (ref 8.9–10.3)
Chloride: 105 mmol/L (ref 98–111)
Creatinine: 0.8 mg/dL (ref 0.44–1.00)
GFR, Estimated: >60 mL/min (ref >60)
Glucose, Bld: 86 mg/dL (ref 70–99)
Potassium: 3.8 mmol/L (ref 3.5–5.1)
Sodium: 139 mmol/L (ref 135–145)
Total Bilirubin: 0.9 mg/dL (ref 0.3–1.2)
Total Protein: 7.8 g/dL (ref 6.5–8.1)

## 2022-07-26 LAB — CBC WITH DIFFERENTIAL (CANCER CENTER ONLY)
Abs Immature Granulocytes: 0.01 10*3/uL (ref 0.00–0.07)
Basophils Absolute: 0.1 10*3/uL (ref 0.0–0.1)
Basophils Relative: 1 %
Eosinophils Absolute: 0.2 10*3/uL (ref 0.0–0.5)
Eosinophils Relative: 4 %
HCT: 38 % (ref 36.0–46.0)
Hemoglobin: 12.6 g/dL (ref 12.0–15.0)
Immature Granulocytes: 0 %
Lymphocytes Relative: 19 %
Lymphs Abs: 0.8 10*3/uL (ref 0.7–4.0)
MCH: 31.7 pg (ref 26.0–34.0)
MCHC: 33.2 g/dL (ref 30.0–36.0)
MCV: 95.5 fL (ref 80.0–100.0)
Monocytes Absolute: 0.4 10*3/uL (ref 0.1–1.0)
Monocytes Relative: 9 %
Neutro Abs: 2.7 10*3/uL (ref 1.7–7.7)
Neutrophils Relative %: 67 %
Platelet Count: 335 10*3/uL (ref 150–400)
RBC: 3.98 MIL/uL (ref 3.87–5.11)
RDW: 14.6 % (ref 11.5–15.5)
WBC Count: 4 10*3/uL (ref 4.0–10.5)
nRBC: 0 % (ref 0.0–0.2)

## 2022-07-28 ENCOUNTER — Other Ambulatory Visit (HOSPITAL_COMMUNITY): Payer: Self-pay

## 2022-07-31 ENCOUNTER — Telehealth: Payer: Self-pay | Admitting: Pharmacy Technician

## 2022-07-31 LAB — MULTIPLE MYELOMA PANEL, SERUM
Albumin SerPl Elph-Mcnc: 3.8 g/dL (ref 2.9–4.4)
Albumin/Glob SerPl: 1.2 (ref 0.7–1.7)
Alpha 1: 0.2 g/dL (ref 0.0–0.4)
Alpha2 Glob SerPl Elph-Mcnc: 0.7 g/dL (ref 0.4–1.0)
B-Globulin SerPl Elph-Mcnc: 1.1 g/dL (ref 0.7–1.3)
Gamma Glob SerPl Elph-Mcnc: 1.3 g/dL (ref 0.4–1.8)
Globulin, Total: 3.3 g/dL (ref 2.2–3.9)
IgA: 474 mg/dL — ABNORMAL HIGH (ref 87–352)
IgG (Immunoglobin G), Serum: 1144 mg/dL (ref 586–1602)
IgM (Immunoglobulin M), Srm: 56 mg/dL (ref 26–217)
Total Protein ELP: 7.1 g/dL (ref 6.0–8.5)

## 2022-07-31 NOTE — Telephone Encounter (Signed)
Oral Oncology Patient Advocate Encounter  Reached out and spoke with patient regarding PAP paperwork, explained that I would send it to their preferred email via DocuSign.   Confirmed email address: [email or web address].    Patient expressed understanding and consent.  Will follow up once paperwork has been signed and returned.   Lady Deutscher, CPhT-Adv Oncology Pharmacy Patient [place] Direct Number: [phone]  Fax: [phone]

## 2022-07-31 NOTE — Telephone Encounter (Signed)
Oral Oncology Patient Advocate Encounter   Received notification that patient is due for re-enrollment for assistance for Revlimid through [employer].   Re-enrollment process has been initiated and will be submitted upon completion of necessary documents.  [employer] phone number [phone].   I will continue to follow until final determination.  Lady Deutscher, CPhT-Adv Oncology Pharmacy Patient [place] Direct Number: [phone]  Fax: [phone]

## 2022-07-31 NOTE — Telephone Encounter (Signed)
Oral Oncology Patient Advocate Encounter   Submitted application for assistance for Revlimid to [employer].   Application submitted via e-fax to [phone]   [facility] phone number [phone].   I will continue to check the status until final determination.   Lady Deutscher, CPhT-Adv Oncology Pharmacy Patient [place] Direct Number: [phone]  Fax: [phone]

## 2022-08-01 ENCOUNTER — Other Ambulatory Visit: Payer: Self-pay

## 2022-08-01 DIAGNOSIS — C9001 Multiple myeloma in remission: Secondary | ICD-10-CM

## 2022-08-01 MED ORDER — LENALIDOMIDE 10 MG PO CAPS: ORAL_CAPSULE | ORAL | 0 refills | Status: DC

## 2022-08-02 ENCOUNTER — Encounter: Payer: Self-pay | Admitting: Hematology

## 2022-08-02 MED ORDER — PROCHLORPERAZINE MALEATE 10 MG PO TABS: 10.0000 mg | ORAL_TABLET | Freq: Four times a day (QID) | ORAL | 1 refills | Status: DC | PRN

## 2022-08-02 MED ORDER — ACYCLOVIR 400 MG PO TABS: 400.0000 mg | ORAL_TABLET | Freq: Two times a day (BID) | ORAL | 11 refills | Status: DC

## 2022-08-02 MED ORDER — ONDANSETRON HCL 8 MG PO TABS: 8.0000 mg | ORAL_TABLET | Freq: Three times a day (TID) | ORAL | 1 refills | Status: DC | PRN

## 2022-08-02 MED ORDER — DEXAMETHASONE 4 MG PO TABS: 4.0000 mg | ORAL_TABLET | Freq: Every day | ORAL | 4 refills | Status: DC

## 2022-08-02 NOTE — Addendum Note (Signed)
Addended by: Sullivan Lone on: 08/02/2022 02:21 AM   Modules accepted: Orders

## 2022-08-02 NOTE — Progress Notes (Signed)
HEMATOLOGY/ONCOLOGY CLINIC NOTE  Date of Service: 07/26/2022    PCP: Paula Contras, MD  CHIEF COMPLAINT: Follow-up for continued evaluation and management of multiple myeloma  DIAGNOSIS   IgA lambda R-ISS stage II multiple myeloma with innumerable lytic lesions in the calvarium, lesion in T7 vertebra and multiple lesions in the pelvis.  No significant bone pain at this time. Diagnosed in January 2017.   Current treatment   Revlimid 57m po daily 3 weeks on 1 week off (after treatment interruption)  Previous Treatment   Status post Vd x 1 cycle VRd x 6 cycles   HD Melphalan 2021mm2 on 03/22/2016 Autologous HSCT on 03/23/2016 with Dr [facility]ule [facility] 6.4 x10^6/kg)   Current rx -Revlimid maintenance    INTERVAL HISTORY:   [name] a [age].o. female is here for continued evaluation and management of multiple myeloma.   She notes no acute new symptoms.  No focal bone pains.  No notable toxicities from her maintenance Revlimid. She has had a follow-up with her transplant team Dr[facility] had a repeat bone marrow biopsy which showed MRD positive status with very low volume progression of her myeloma and has been recommended to start on Darzalex in addition to her maintenance Revlimid. I discussed this treatment in detail with the patient and she is agreeable to pursue this. Labs done today were discussed in detail with the patient.  REVIEW OF SYSTEMS:   10 Point review of Systems was done is negative except as noted above.  PHYSICAL EXAMINATION: .BP 108/72 (BP Location: Left Arm, Patient Position: Sitting)   Pulse 85   Temp 97.9 F (36.6 C) (Temporal)   Resp 17   Ht 5' 7.5" (1.715 m)   Wt 136 lb 9.6 oz (62 kg)   LMP 09/26/1995 (Within Months)   SpO2 98%   BMI 21.08 kg/m  NAD GENERAL:alert, in no acute distress and comfortable SKIN: no acute rashes, no significant lesions EYES: conjunctiva are pink and non-injected, sclera  anicteric OROPHARYNX: MMM, no exudates, no oropharyngeal erythema or ulceration NECK: supple, no JVD LYMPH:  no palpable lymphadenopathy in the cervical, axillary or inguinal regions LUNGS: clear to auscultation b/l with normal respiratory effort HEART: regular rate & rhythm ABDOMEN:  normoactive bowel sounds , non tender, not distended. Extremity: no pedal edema PSYCH: alert & oriented x 3 with fluent speech NEURO: no focal motor/sensory deficits    MEDICAL HISTORY:   Past Medical History:  Diagnosis Date   Abdominal pain    Abnormal Pap smear of vagina 03/22/2017   ASCUS pap and negative HR HPV.  Patient is on immunosuppressive medication.    Allergic reaction to bee sting    Anemia    AR (aortic regurgitation) 11/30/2017   trace noted on ECHO   Atrial septal aneurysm per 11-30-17 echo   trivial pericardial effusion   Complicated migraine    Constipation    DDD (degenerative disc disease), cervical    DDD (degenerative disc disease), lumbosacral    Fatty liver    Female proctocele without uterine prolapse    Fibroid    reason for Hysterectomy   FTT (failure to thrive) in adult 08/09/2017   Gastroesophageal reflux disease without esophagitis 01/17/2017   Generalized anxiety disorder    Grade I diastolic dysfunction    Hammer toe 05/13/2018   Hematuria 11/08/2015   History of COVID-19 12/2020   History of stress incontinence    Hypergammaglobulinemia    Hyperlipidemia  Hypoxemia 12/16/2007   Insomnia    Leg length discrepancy    Loss of appetite    Lung nodule    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   Metastatic multiple myeloma to bone 10/19/2015   Metatarsalgia of right foot 07/15/2018   MR (mitral regurgitation) 11/30/2017   trace noted on ECHO   Muscular fasciculation    Nasal septal deviation    Nasal turbinate hypertrophy    Near syncope    Obstructive sleep apnea 12/03/2007   Other fatigue    Peripheral edema    Plasma cell dyscrasia    PONV  (postoperative nausea and vomiting)    thinks morphine caused nausea   Postoperative urinary retention 05/14/2018   Prediabetes    Pure hypercholesterolemia    Rash 11/01/2015   Restless leg syndrome    S/P autologous bone marrow transplantation 03/29/2016   Shortness of breath 12/04/2007   TR (tricuspid regurgitation) 11/30/2017   trace noted on ECHO   Urinary incontinence    UTI (urinary tract infection)    Vaginal granulation tissue 12/03/2017   Vitamin D deficiency    Wears glasses     SURGICAL HISTORY: Past Surgical History:  Procedure Laterality Date   ABDOMINAL HYSTERECTOMY  1997   TAH--ovaries remain--Dr. Newton Pigg   ANTERIOR AND POSTERIOR REPAIR N/A 05/14/2018   Procedure: ANTERIOR (CYSTOCELE) AND POSTERIOR REPAIR (RECTOCELE);  Surgeon: [name], MD;  Location: [facility];  Service: Gynecology;  Laterality: N/A;   BLADDER SUSPENSION N/A 05/14/2018   Procedure: TRANSVAGINAL TAPE (TVT) PROCEDURE exact midurethral sling;  Surgeon: [name], MD;  Location: [facility];  Service: Gynecology;  Laterality: N/A;   BONE MARROW TRANSPLANT  2017   done at [place] N/A 05/14/2018   Procedure: CYSTOSCOPY;  Surgeon: [name], MD;  Location: [facility];  Service: Gynecology;  Laterality: N/A;   CYSTOSCOPY N/A 05/30/2018   Procedure: CYSTOSCOPY;  Surgeon: [name], MD;  Location: [facility];  Service: Gynecology;  Laterality: N/A;   ROBOTIC ASSISTED LAPAROSCOPIC SACROCOLPOPEXY N/A 05/14/2018   Procedure: XI ROBOTIC ASSISTED LAPAROSCOPIC SACROCOLPOPEXY;  Surgeon: [name], MD;  Location: [facility];  Service: Gynecology;  Laterality: N/A;  6 hours OR time. Need extended stay recovery bed.   SALPINGOOPHORECTOMY Bilateral 05/14/2018   Procedure: SALPINGO OOPHORECTOMY;  Surgeon: [name], MD;  Location: [facility];  Service: Gynecology;  Laterality:  Bilateral;   THYMECTOMY  1999   TRANSVAGINAL TAPE (TVT) REMOVAL N/A 05/30/2018   Procedure: TRANSVAGINAL TAPE (TVT)  Revision;  Surgeon: [name], MD;  Location: [facility];  Service: Gynecology;  Laterality: N/A;   WISDOM TOOTH EXTRACTION      SOCIAL HISTORY: Social History   Socioeconomic History   Marital status: Married    Spouse name: [name]   Number of children: 2   Years of education: 16   Highest education level: Bachelor's degree (e.g., BA, AB, BS)  Occupational History   Occupation: Retired    Comment: Merchandiser, retail [employer]  Tobacco Use   Smoking status: Never   Smokeless tobacco: Never  Vaping Use   Vaping Use: Never used  Substance and Sexual Activity   Alcohol use: Not Currently    Alcohol/week: 0.0 standard drinks of alcohol   Drug use: Never   Sexual activity:  Not Currently    Partners: Male    Birth control/protection: Surgical    Comment: TAH--ovaries remain  Other Topics Concern   Not on file  Social History Narrative   Lives with husband   Caffeine- coffee, 2 cups daily   Right handed   Social Determinants of Health   Financial Resource Strain: Not on file  Food Insecurity: Not on file  Transportation Needs: Not on file  Physical Activity: Not on file  Stress: Not on file  Social Connections: Not on file  Intimate Partner Violence: Not on file    FAMILY HISTORY: Family History  Problem Relation Age of Onset   Hyperlipidemia Mother    Memory loss Mother        45s; believed to be age-related memory decline   COPD Father        dec age 8/s-smoked   Macular degeneration Father    Behavior problems Father        at the end of life   Diabetes Brother    Macular degeneration Brother    Suicidality Brother    Diabetes Maternal Grandmother    Heart attack Paternal Grandfather     ALLERGIES:  is allergic to ampicillin and penicillins.  MEDICATIONS:  . Current Outpatient Medications:    Ascorbic  Acid (VITAMIN C) 1000 MG tablet, Take 1,000 mg by mouth 2 (two) times daily., Disp: , Rfl:    aspirin EC 81 MG tablet, Take 81 mg by mouth daily., Disp: , Rfl:    b complex vitamins capsule, Take 1 capsule by mouth daily., Disp: , Rfl:    Cholecalciferol (VITAMIN D3) 30 MCG/15ML LIQD, Take by mouth. Pt. Takes 5000 IU daily, Disp: , Rfl:    Cyanocobalamin (B-12) 1000 MCG CAPS, , Disp: , Rfl:    EPINEPHrine 0.3 mg/0.3 mL IJ SOAJ injection, INJECT INTRAMUSCULARLLY AS DIRECTED AS NEEDED FOR ANAPHYLAXIS, Disp: , Rfl:    estradiol (ESTRACE) 0.1 MG/GM vaginal cream, Place 1 gram per vagina at bedtime twice a week., Disp: 43 g, Rfl: 0   lenalidomide (REVLIMID) 10 MG capsule, TAKE ONE CAPSULE BY MOUTH DAILY FOR 21 DAYS, THEN 7 DAYS OFF, Disp: 21 capsule, Rfl: 0   Magnesium 400 MG CAPS, Take by mouth., Disp: , Rfl:    LABORATORY DATA:  I have reviewed the data as listed  .    Latest Ref Rng & Units 07/26/2022   11:52 AM 04/21/2022    1:24 PM 01/16/2022    1:50 PM  CBC  WBC 4.0 - 10.5 K/uL 4.0  4.2  3.6   Hemoglobin 12.0 - 15.0 g/dL 12.6  13.7  12.3   Hematocrit 36.0 - 46.0 % 38.0  40.0  37.2   Platelets 150 - 400 K/uL 335  314  280     .    Latest Ref Rng & Units 07/26/2022   11:52 AM 04/21/2022    1:24 PM 01/16/2022    1:50 PM  CMP  Glucose 70 - 99 mg/dL 86  99  86   BUN 8 - 23 mg/dL _0 Creatinine 0.44 - 1.00 mg/dL 0.80  1.24  0.84   Sodium 135 - 145 mmol/L 139  138  141   Potassium 3.5 - 5.1 mmol/L 3.8  4.3  3.5   Chloride 98 - 111 mmol/L 105  104  103   CO2 22 - 32 mmol/L _1 Calcium 8.9 - 10.3 mg/dL  9.6  9.4  9.6   Total Protein 6.5 - 8.1 g/dL 7.8  7.6  7.6   Total Bilirubin 0.3 - 1.2 mg/dL 0.9  1.1  0.9   Alkaline Phos 38 - 126 U/L 75  74  81   AST 15 - 41 U/L _0 ALT 0 - 44 U/L _1 10/10/2019 Flow Cytometry (Repeat BM Bx from [facility]):   10/07/18 Repeat BM Bx from [facility]:   Surgical Pathology                                Case:  [id number]                               Authorizing Provider:  Richardson Landry, NP    Collected:           07/04/2022 1015             Ordering Location:     [facility]   Received:            07/04/2022 1048                                    [facility]                                                   Pathologist:           Lelon Frohlich, MD                                                   Specimens:   A) - Bone Marrow, Aspirate                                                                        B) - Bone Marrow, Biopsy                                                                          C) - Bone Marrow, Clot                                                                 DIAGNOSIS  A-C. Peripheral blood and bone marrow (peripheral smear, aspirate, touch preparation, core biopsy, and clot section): - Low-level plasma cell neoplasm, see comment.   COMMENT: The core biopsy is 40% cellular. Cyclin-D1 immunohistochemical stain demonstrates very rare positive plasma cells (1%). Concurrent flow cytometry study ([id number]) shows a plasma cell population with an atypical immunophenotype, comprising approximately 0.04% of analyzed events.    Reviewed by resident/fellow Beola Cord  Electronically signed by Lelon Frohlich, MD on 07/10/2022 at 12:25 PM  Clinical Information   66 year old female with history of plasma cell myeloma.  Gross Examination   A. Peripheral blood and bone marrow labeled with patient's name and medical record number, one peripheral blood smear, three bone marrow aspirate smears and one bone marrow touch preparation are received.   B. "Bone marrow biopsy", in AZF.  Core biopsies of red-brown tissue 1.4 x 0.2 cm in aggregate are submitted in total as block B1 for decalcification.   C. "Bone marrow clot", in AZF.  A 1.8 x 1.8 x 0.3 cm aggregate of red-brown clot is submitted in total as block C1.    J.Elam/ Dr.  Linzie Collin   Microscopic Examination        Lab Results  Component Value Date    WBC 5.2 07/04/2022    HGB 12.1 07/04/2022    PLT 364 07/04/2022    MCV 95 07/04/2022    RDWCV 14.6 (H) 07/04/2022    Peripheral blood and automated blood count:  RBCs: The red blood cells are adequate in number,  normocytic and normochromic, with minimal anisopoikilocytosis. WBCs: The white blood cells are adequate in number and show unremarkable morphology. Platelets: The platelets are adequate in number and show unremarkable morphology.   Bone marrow aspirate:  There are adequate cellular particles. M:E ratio is 3:1.  Granulopoiesis: Maturing myeloid elements are present and show complete maturation. Erythropoiesis:  Erythroid elements are present and show complete maturation. Megakaryocytes: Megakaryocytes are adequate in number and show unremarkable morphology.  There are slightly increased plasma cells with unremarkable morphology.   Bone marrow touch preparation: Adequate marrow elements are present, without additional findings to the aspirate smears.   Prussian blue stained aspirate smear: 0 of 4 stainable iron is present. No ringed sideroblasts are identified.   Bone marrow core biopsy:  The core biopsy is adequate for evaluation. The cellularity is 40%. Maturing myeloid elements are present and show complete maturation. Maturing erythroid elements are present and show complete maturation.  Megakaryocytes are adequate in number and show unremarkable morphology. There are slightly increased plasma cells.   A reticulin stain of the core biopsy shows no significant reticulin fibrosis.   Aspirate clot section: There are adequate particles present, without additional findings to the core biopsy.   Immunohistochemical stains for Cyclin-D1, CD138, Kappa CISH, and Lambda CISH are performed on the core biopsy section (block B1). CD138, kappa, and lambda in-situ hybridization demonstrates 5-10%  predominantly polytypic plasma cells. Cyclin-D1 demonstrates very rare positive plasma cells (1%).  Bone Marrow Differential   500 CELL DIFF   Cell Type Cell Count Reference Range Cell Type Cell Count Reference Range Cell Type Cell Count Reference Range  Segs 23 (7-25) Lymphocytes 15 (3-20) Plasma Cells 8 (0-3.5)  Bands 3 (6-36)     Atypicals 0   RBC Precursors 22 (10-30)  Metas 2 (9-25) Lymphoblasts *   Pronormoblasts 0 (0-3)  Myelos 5 (8-15) Eosinophils 12 (0-4) M:E Ratio 3/1    Promyelos 1 (1-6) Basophils 0 (0-1) Iron 0 (Scale  0-4+)  Myeloblasts 1 (0-3.5) Monocytes 8 (0-2)                                                                                                     Performed by:    Tyrone Sage                                                                                                         July 04, 2022 12:21 PM  Additional Documentation   All immunohistochemistry, in situ hybridization tests and special stains performed at [facility] and reported herein were developed, validated and their performance characteristics determined by the [facility]. During the performance of these tests, appropriate positive and negative control slides are also performed and reviewed. All control slides and internal controls (when applicable) demonstrate the expected immunoreactive patterns and/or nucleic acid hybridization. These ancillary studies were deemed medically necessary by the requesting pathologist. They were ordered following review of the H&E and clinical history except when part of a liver/kidney protocol or where clinical history (e.g. immunocompromised, critically ill, history of malignancy) clearly indicates. Some of the tests may not be cleared or approved by the U.S. Food and Drug Administration (FDA).  The FDA has determined that such clearance or approval is not necessary.  These tests are used for clinical purposes and should not be  regarded as investigational or as research.  This laboratory is certified under the [place] (CLIA) as qualified to perform high complexity clinical testing. At least one block in this case underwent decalcification during its processing. The vast majority of immunohistochemical and hybridization assays were not validated on decalcified tissues. The results were interpreted with caution given the possibility of false negative results on decalcified specimens.  Attestation   All of the diagnostic evaluations on the enumerated specimens have been personally conducted by the pathologists involved in the care of this patient as indicated by the electronic signatures above.  Resulting Agency [facility]   Specimen Collected: 07/04/22 10:15   Performed by: [place] Last Resulted: 07/10/22 12:25  Received From: [facility]  Result Received: 07/14/22 09:58   View Encounter      Received Information Pathology - Bone Marrow (Order [id number])    suggestion  Information displayed in this report may not trend or trigger automated decision support.    Pathology - Bone Marrow Order: [id number] Component 4 wk ago  Case Report Surgical Pathology  Case: [id number]                               Authorizing Provider:  Richardson Landry, NP    Collected:           07/04/2022 1015             Ordering Location:     [facility]   Received:            07/04/2022 1048                                    [facility]                                                   Pathologist:           Lelon Frohlich, MD                                                   Specimens:   A) - Bone Marrow, Aspirate                                                                        B) - Bone Marrow, Biopsy                                                                           C) - Bone Marrow, Clot                                                                DIAGNOSIS   A-C. Peripheral blood and bone marrow (peripheral smear, aspirate, touch preparation, core biopsy, and clot section): - Low-level plasma cell neoplasm, see comment.   COMMENT: The core biopsy is 40% cellular. Cyclin-D1 immunohistochemical stain demonstrates very rare positive plasma cells (1%). Concurrent flow cytometry study ([id number]) shows a plasma cell population with an atypical immunophenotype, comprising approximately 0.04% of analyzed events.    Reviewed by resident/fellow Beola Cord  Electronically signed by Lelon Frohlich, MD on 07/10/2022 at 12:25 PM  Clinical Information   66 year old female with history of plasma cell myeloma.  Gross Examination   A. Peripheral blood and bone marrow labeled with patient's name and medical record number, one peripheral blood smear, three bone marrow aspirate smears  and one bone marrow touch preparation are received.   B. "Bone marrow biopsy", in AZF.  Core biopsies of red-brown tissue 1.4 x 0.2 cm in aggregate are submitted in total as block B1 for decalcification.   C. "Bone marrow clot", in AZF.  A 1.8 x 1.8 x 0.3 cm aggregate of red-brown clot is submitted in total as block C1.    J.Elam/ Dr. Linzie Collin     RADIOGRAPHIC STUDIES: I have personally reviewed the radiological images as listed and agreed with the findings in the report. No results found.  ASSESSMENT & PLAN:   66 y.o. caucasian female with  1. H/o Multiple myeloma - currently in remission.  But recent bone marrow biopsy shows she has turned MRD positive   .IgA lambda R-ISS stage II multiple myeloma with innumerable lytic lesions in the calvarium, lesion in T7 vertebra and multiple lesions in the pelvis.  No significant bone pain at this time. Diagnosed in January 2017.  s/p treatment as noted above 10/07/18 BM Bx and Flow cytometry indicate  that the pt continues to be in CR 10/10/2019 Flow Cytometry revealed "A. Bone Marrow (Plasma Cell Myeloma Minimal Residual Disease Detection by Flow Cytometry): Negative. No phenotypically abnormal plasma cells at or above the limit of detection identified."  PLAN:  Labs done today were discussed in detail with the patient. -CBC and CMP stable -Recent myeloma labs showed no M spike and normal kappa lambda free light chains.  B[facility] biopsy done at [facility] shows that she has developed new MRD positivity at low levels suggesting very early signs of progression. Her transplant team is recommended she start subcu Darzalex in addition to her maintenance Revlimid. -I discussed this option in detail with the patient and she is agreeable to proceed. -We discussed getting the flu shot, RSV vaccine and COVID-19 vaccination. -Continue Vitamin D 5000 IU 5x per week and 10000 IU 2x per week  -Continue aspirin 81 mg p.o. daily -We will need to restart acyclovir 400 mg p.o. twice daily while on daratumumab Orders for subcutaneous daratumumab were placed.  FOLLOW UP:  Start subcutaneous daratumumab in 1 to 2 weeks and continue maintenance Revlimid.  The total time spent in the appointment was 40 minutes*.  All of the patient's questions were answered with apparent satisfaction. The patient knows to call the clinic with any problems, questions or concerns.   Sullivan Lone MD MS AAHIVMS [facility] Hematology/Oncology Physician [facility]  .*Total Encounter Time as defined by the Centers for Medicare and Medicaid Services includes, in addition to the face-to-face time of a patient visit (documented in the note above) non-face-to-face time: obtaining and reviewing outside history, ordering and reviewing medications, tests or procedures, care coordination (communications with other health care professionals or caregivers) and documentation in the medical record.

## 2022-08-03 ENCOUNTER — Other Ambulatory Visit: Payer: Self-pay

## 2022-08-03 ENCOUNTER — Other Ambulatory Visit: Payer: Self-pay | Admitting: Hematology

## 2022-08-03 DIAGNOSIS — C9001 Multiple myeloma in remission: Secondary | ICD-10-CM

## 2022-08-04 ENCOUNTER — Other Ambulatory Visit: Payer: Self-pay

## 2022-08-04 NOTE — Progress Notes (Signed)
Pharmacist Chemotherapy Monitoring - Initial Assessment    Anticipated start date: 08/11/22   The following has been reviewed per standard work regarding the patient's treatment regimen: The patient's diagnosis, treatment plan and drug doses, and organ/hematologic function Lab orders and baseline tests specific to treatment regimen  The treatment plan start date, drug sequencing, and pre-medications Prior authorization status  Patient's documented medication list, including drug-drug interaction screen and prescriptions for anti-emetics and supportive care specific to the treatment regimen The drug concentrations, fluid compatibility, administration routes, and timing of the medications to be used The patient's access for treatment and lifetime cumulative dose history, if applicable  The patient's medication allergies and previous infusion related reactions, if applicable   Changes made to treatment plan:  N/A  Follow up needed:  N/A   Philomena Course, [place], 08/04/2022  11:07 AM

## 2022-08-11 ENCOUNTER — Inpatient Hospital Stay: Payer: Medicare Other

## 2022-08-11 ENCOUNTER — Other Ambulatory Visit: Payer: Self-pay

## 2022-08-11 ENCOUNTER — Inpatient Hospital Stay: Payer: Medicare Other | Admitting: Hematology

## 2022-08-11 VITALS — BP 113/67 | HR 77 | Temp 98.4°F | Resp 14

## 2022-08-11 DIAGNOSIS — C9001 Multiple myeloma in remission: Secondary | ICD-10-CM | POA: Diagnosis not present

## 2022-08-11 LAB — CBC WITH DIFFERENTIAL (CANCER CENTER ONLY)
Abs Immature Granulocytes: 0.01 10*3/uL (ref 0.00–0.07)
Basophils Absolute: 0.1 10*3/uL (ref 0.0–0.1)
Basophils Relative: 1 %
Eosinophils Absolute: 0.2 10*3/uL (ref 0.0–0.5)
Eosinophils Relative: 4 %
HCT: 37.5 % (ref 36.0–46.0)
Hemoglobin: 12.6 g/dL (ref 12.0–15.0)
Immature Granulocytes: 0 %
Lymphocytes Relative: 22 %
Lymphs Abs: 1 10*3/uL (ref 0.7–4.0)
MCH: 32.2 pg (ref 26.0–34.0)
MCHC: 33.6 g/dL (ref 30.0–36.0)
MCV: 95.9 fL (ref 80.0–100.0)
Monocytes Absolute: 0.5 10*3/uL (ref 0.1–1.0)
Monocytes Relative: 10 %
Neutro Abs: 2.7 10*3/uL (ref 1.7–7.7)
Neutrophils Relative %: 63 %
Platelet Count: 307 10*3/uL (ref 150–400)
RBC: 3.91 MIL/uL (ref 3.87–5.11)
RDW: 14.4 % (ref 11.5–15.5)
WBC Count: 4.4 10*3/uL (ref 4.0–10.5)
nRBC: 0 % (ref 0.0–0.2)

## 2022-08-11 LAB — TYPE AND SCREEN
ABO/RH(D): O POS
Antibody Screen: NEGATIVE

## 2022-08-11 MED ORDER — FAMOTIDINE 20 MG PO TABS
20.0000 mg | ORAL_TABLET | Freq: Once | ORAL | Status: AC
  Administered 2022-08-11: 20 mg via ORAL
  Filled 2022-08-11: qty 1

## 2022-08-11 MED ORDER — DARATUMUMAB-HYALURONIDASE-FIHJ 1800-30000 MG-UT/15ML ~~LOC~~ SOLN
1800.0000 mg | Freq: Once | SUBCUTANEOUS | Status: AC
  Administered 2022-08-11: 1800 mg via SUBCUTANEOUS
  Filled 2022-08-11: qty 15

## 2022-08-11 MED ORDER — ACETAMINOPHEN 325 MG PO TABS
650.0000 mg | ORAL_TABLET | Freq: Once | ORAL | Status: AC
  Administered 2022-08-11: 650 mg via ORAL
  Filled 2022-08-11: qty 2

## 2022-08-11 MED ORDER — MONTELUKAST SODIUM 10 MG PO TABS
10.0000 mg | ORAL_TABLET | Freq: Once | ORAL | Status: AC
  Administered 2022-08-11: 10 mg via ORAL
  Filled 2022-08-11: qty 1

## 2022-08-11 MED ORDER — DEXAMETHASONE 4 MG PO TABS
20.0000 mg | ORAL_TABLET | Freq: Once | ORAL | Status: AC
  Administered 2022-08-11: 20 mg via ORAL
  Filled 2022-08-11: qty 5

## 2022-08-11 MED ORDER — DIPHENHYDRAMINE HCL 25 MG PO CAPS
50.0000 mg | ORAL_CAPSULE | Freq: Once | ORAL | Status: AC
  Administered 2022-08-11: 50 mg via ORAL
  Filled 2022-08-11: qty 2

## 2022-08-11 NOTE — Patient Instructions (Signed)
[facility]  Discharge Instructions: Thank you for choosing [facility] to provide your oncology and hematology care.   If you have a lab appointment with the Cancer Center, please go directly to the Cancer Center and check in at the registration area.   Wear comfortable clothing and clothing appropriate for easy access to any Portacath or PICC line.   We strive to give you quality time with your provider. You may need to reschedule your appointment if you arrive late (15 or more minutes).  Arriving late affects you and other patients whose appointments are after yours.  Also, if you miss three or more appointments without notifying the office, you may be dismissed from the clinic at the provider's discretion.      For prescription refill requests, have your pharmacy contact our office and allow 72 hours for refills to be completed.    Today you received the following chemotherapy and/or immunotherapy agents: Darzalex Faspro      To help prevent nausea and vomiting after your treatment, we encourage you to take your nausea medication as directed.  BELOW ARE SYMPTOMS THAT SHOULD BE REPORTED IMMEDIATELY: *FEVER GREATER THAN 100.4 F (38 C) OR HIGHER *CHILLS OR SWEATING *NAUSEA AND VOMITING THAT IS NOT CONTROLLED WITH YOUR NAUSEA MEDICATION *UNUSUAL SHORTNESS OF BREATH *UNUSUAL BRUISING OR BLEEDING *URINARY PROBLEMS (pain or burning when urinating, or frequent urination) *BOWEL PROBLEMS (unusual diarrhea, constipation, pain near the anus) TENDERNESS IN MOUTH AND THROAT WITH OR WITHOUT PRESENCE OF ULCERS (sore throat, sores in mouth, or a toothache) UNUSUAL RASH, SWELLING OR PAIN  UNUSUAL VAGINAL DISCHARGE OR ITCHING   Items with * indicate a potential emergency and should be followed up as soon as possible or go to the Emergency Department if any problems should occur.  Please show the CHEMOTHERAPY ALERT CARD or IMMUNOTHERAPY ALERT CARD at  check-in to the Emergency Department and triage nurse.  Should you have questions after your visit or need to cancel or reschedule your appointment, please contact [facility]  Dept: [phone]  and follow the prompts.  Office hours are 8:00 a.m. to 4:30 p.m. Monday - Friday. Please note that voicemails left after 4:00 p.m. may not be returned until the following business day.  We are closed weekends and major holidays. You have access to a nurse at all times for urgent questions. Please call the main number to the clinic Dept: [phone] and follow the prompts.   For any non-urgent questions, you may also contact your provider using MyChart. We now offer e-Visits for anyone 18 and older to request care online for non-urgent symptoms. For details visit [email or web address].   Also download the MyChart app! Go to the app store, search "MyChart", open the app, select [facility], and log in with your MyChart username and password.  Masks are optional in the cancer centers. If you would like for your care team to wear a mask while they are taking care of you, please let them know. You may have one support person who is at least 68 years old accompany you for your appointments. Daratumumab; Hyaluronidase Injection What is this medication? DARATUMUMAB; HYALURONIDASE (dar a toom ue mab; hye al ur ON i dase) treats multiple myeloma, a type of bone marrow cancer. Daratumumab works by blocking a protein that causes cancer cells to grow and multiply. This helps to slow or stop the spread of cancer cells. Hyaluronidase works by increasing the absorption of   other medications in the body to help them work better. This medication may also be used treat amyloidosis, a condition that causes the buildup of a protein (amyloid) in your body. It works by reducing the buildup of this protein, which decreases symptoms. It is a combination medication that contains a monoclonal  antibody. This medicine may be used for other purposes; ask your health care provider or pharmacist if you have questions. COMMON BRAND NAME(S): DARZALEX FASPRO What should I tell my care team before I take this medication? They need to know if you have any of these conditions: Heart disease Infection, such as chickenpox, cold sores, herpes, hepatitis B Lung or breathing disease An unusual or allergic reaction to daratumumab, hyaluronidase, other medications, foods, dyes, or preservatives Pregnant or trying to get pregnant Breast-feeding How should I use this medication? This medication is injected under the skin. It is given by your care team in a hospital or clinic setting. Talk to your care team about the use of this medication in children. Special care may be needed. Overdosage: If you think you have taken too much of this medicine contact a poison control center or emergency room at once. NOTE: This medicine is only for you. Do not share this medicine with others. What if I miss a dose? Keep appointments for follow-up doses. It is important not to miss your dose. Call your care team if you are unable to keep an appointment. What may interact with this medication? Interactions have not been studied. This list may not describe all possible interactions. Give your health care provider a list of all the medicines, herbs, non-prescription drugs, or dietary supplements you use. Also tell them if you smoke, drink alcohol, or use illegal drugs. Some items may interact with your medicine. What should I watch for while using this medication? Your condition will be monitored carefully while you are receiving this medication. This medication can cause serious allergic reactions. To reduce your risk, your care team may give you other medication to take before receiving this one. Be sure to follow the directions from your care team. This medication can affect the results of blood tests to match your  blood type. These changes can last for up to 6 months after the final dose. Your care team will do blood tests to match your blood type before you start treatment. Tell all of your care team that you are being treated with this medication before receiving a blood transfusion. This medication can affect the results of some tests used to determine treatment response; extra tests may be needed to evaluate response. Talk to your care team if you wish to become pregnant or think you are pregnant. This medication can cause serious birth defects if taken during pregnancy and for 3 months after the last dose. A reliable form of contraception is recommended while taking this medication and for 3 months after the last dose. Talk to your care team about effective forms of contraception. Do not breast-feed while taking this medication. What side effects may I notice from receiving this medication? Side effects that you should report to your care team as soon as possible: Allergic reactions--skin rash, itching, hives, swelling of the face, lips, tongue, or throat Heart rhythm changes--fast or irregular heartbeat, dizziness, feeling faint or lightheaded, chest pain, trouble breathing Infection--fever, chills, cough, sore throat, wounds that don't heal, pain or trouble when passing urine, general feeling of discomfort or being unwell Infusion reactions--chest pain, shortness of breath or trouble breathing,   breathing, feeling faint or lightheaded Sudden eye pain or change in vision such as blurry vision, seeing halos around lights, vision loss Unusual bruising or bleeding Side effects that usually do not require medical attention (report to your care team if they continue or are bothersome): Constipation Diarrhea Fatigue Nausea Pain, tingling, or numbness in the hands or feet Swelling of the ankles, hands, or feet This list may not describe all possible side effects. Call your doctor for medical advice about side effects.  You may report side effects to FDA at 1-800-FDA-1088. Where should I keep my medication? This medication is given in a hospital or clinic. It will not be stored at home. NOTE: This sheet is a summary. It may not cover all possible information. If you have questions about this medicine, talk to your doctor, pharmacist, or health care provider.  2023 Elsevier/Gold Standard (2022-01-04 00:00:00)

## 2022-08-11 NOTE — Progress Notes (Signed)
Pt observed for 2 hours post first Darzalex Faspro injection.. Pt tolerated trtmt well w/out incident. VSS at discharge.  Ambulatory to lobby.

## 2022-08-14 ENCOUNTER — Encounter: Payer: Self-pay | Admitting: Hematology

## 2022-08-14 LAB — PRETREATMENT RBC PHENOTYPE: DAT, IgG: NEGATIVE

## 2022-08-14 NOTE — Telephone Encounter (Signed)
-----   Message from Willis Modena, RN sent at 08/11/2022  4:15 PM EST ----- Regarding: [name] 1st time Darzalex Faspro f/u tol well [name] 1st time Darzalex Faspro, Pt tolerated tx well and was observed for 2 hours post injection. Pt call back due.

## 2022-08-14 NOTE — Telephone Encounter (Signed)
Called & left message on identified vm to call back to let us know how she did with her recent treatment.

## 2022-08-15 ENCOUNTER — Other Ambulatory Visit: Payer: Self-pay

## 2022-08-18 ENCOUNTER — Inpatient Hospital Stay: Payer: Medicare Other

## 2022-08-18 ENCOUNTER — Encounter: Payer: Self-pay | Admitting: Hematology

## 2022-08-18 VITALS — BP 122/70 | HR 76 | Temp 97.9°F | Resp 16

## 2022-08-18 DIAGNOSIS — C9001 Multiple myeloma in remission: Secondary | ICD-10-CM | POA: Diagnosis not present

## 2022-08-18 LAB — CBC WITH DIFFERENTIAL (CANCER CENTER ONLY)
Abs Immature Granulocytes: 0.01 10*3/uL (ref 0.00–0.07)
Basophils Absolute: 0 10*3/uL (ref 0.0–0.1)
Basophils Relative: 1 %
Eosinophils Absolute: 0.3 10*3/uL (ref 0.0–0.5)
Eosinophils Relative: 8 %
HCT: 37.4 % (ref 36.0–46.0)
Hemoglobin: 12.6 g/dL (ref 12.0–15.0)
Immature Granulocytes: 0 %
Lymphocytes Relative: 13 %
Lymphs Abs: 0.5 10*3/uL — ABNORMAL LOW (ref 0.7–4.0)
MCH: 31.9 pg (ref 26.0–34.0)
MCHC: 33.7 g/dL (ref 30.0–36.0)
MCV: 94.7 fL (ref 80.0–100.0)
Monocytes Absolute: 0.5 10*3/uL (ref 0.1–1.0)
Monocytes Relative: 14 %
Neutro Abs: 2.3 10*3/uL (ref 1.7–7.7)
Neutrophils Relative %: 64 %
Platelet Count: 276 10*3/uL (ref 150–400)
RBC: 3.95 MIL/uL (ref 3.87–5.11)
RDW: 14.4 % (ref 11.5–15.5)
WBC Count: 3.5 10*3/uL — ABNORMAL LOW (ref 4.0–10.5)
nRBC: 0 % (ref 0.0–0.2)

## 2022-08-18 MED ORDER — DARATUMUMAB-HYALURONIDASE-FIHJ 1800-30000 MG-UT/15ML ~~LOC~~ SOLN
1800.0000 mg | Freq: Once | SUBCUTANEOUS | Status: AC
  Administered 2022-08-18: 1800 mg via SUBCUTANEOUS
  Filled 2022-08-18: qty 15

## 2022-08-18 MED ORDER — FAMOTIDINE 20 MG PO TABS
20.0000 mg | ORAL_TABLET | Freq: Once | ORAL | Status: AC
  Administered 2022-08-18: 20 mg via ORAL
  Filled 2022-08-18: qty 1

## 2022-08-18 MED ORDER — DIPHENHYDRAMINE HCL 25 MG PO CAPS
50.0000 mg | ORAL_CAPSULE | Freq: Once | ORAL | Status: AC
  Administered 2022-08-18: 50 mg via ORAL
  Filled 2022-08-18: qty 2

## 2022-08-18 MED ORDER — DEXAMETHASONE 4 MG PO TABS
20.0000 mg | ORAL_TABLET | Freq: Once | ORAL | Status: AC
  Administered 2022-08-18: 20 mg via ORAL
  Filled 2022-08-18: qty 5

## 2022-08-18 MED ORDER — ACETAMINOPHEN 325 MG PO TABS
650.0000 mg | ORAL_TABLET | Freq: Once | ORAL | Status: AC
  Administered 2022-08-18: 650 mg via ORAL
  Filled 2022-08-18: qty 2

## 2022-08-18 MED ORDER — MONTELUKAST SODIUM 10 MG PO TABS
10.0000 mg | ORAL_TABLET | Freq: Once | ORAL | Status: AC
  Administered 2022-08-18: 10 mg via ORAL
  Filled 2022-08-18: qty 1

## 2022-08-18 NOTE — Patient Instructions (Signed)
[facility]  Discharge Instructions: Thank you for choosing [facility] to provide your oncology and hematology care.   If you have a lab appointment with the [facility], please go directly to the [facility] and check in at the registration area.   Wear comfortable clothing and clothing appropriate for easy access to any Portacath or PICC line.   We strive to give you quality time with your provider. You may need to reschedule your appointment if you arrive late (15 or more minutes).  Arriving late affects you and other patients whose appointments are after yours.  Also, if you miss three or more appointments without notifying the office, you may be dismissed from the clinic at the provider's discretion.      For prescription refill requests, have your pharmacy contact our office and allow 72 hours for refills to be completed.    Today you received the following chemotherapy and/or immunotherapy agents: daratumumab-hyaluronidase-fihj      To help prevent nausea and vomiting after your treatment, we encourage you to take your nausea medication as directed.  BELOW ARE SYMPTOMS THAT SHOULD BE REPORTED IMMEDIATELY: *FEVER GREATER THAN 100.4 F (38 C) OR HIGHER *CHILLS OR SWEATING *NAUSEA AND VOMITING THAT IS NOT CONTROLLED WITH YOUR NAUSEA MEDICATION *UNUSUAL SHORTNESS OF BREATH *UNUSUAL BRUISING OR BLEEDING *URINARY PROBLEMS (pain or burning when urinating, or frequent urination) *BOWEL PROBLEMS (unusual diarrhea, constipation, pain near the anus) TENDERNESS IN MOUTH AND THROAT WITH OR WITHOUT PRESENCE OF ULCERS (sore throat, sores in mouth, or a toothache) UNUSUAL RASH, SWELLING OR PAIN  UNUSUAL VAGINAL DISCHARGE OR ITCHING   Items with * indicate a potential emergency and should be followed up as soon as possible or go to the Emergency Department if any problems should occur.  Please show the CHEMOTHERAPY ALERT CARD or IMMUNOTHERAPY ALERT  CARD at check-in to the Emergency Department and triage nurse.  Should you have questions after your visit or need to cancel or reschedule your appointment, please contact [facility]  Dept: [phone]  and follow the prompts.  Office hours are 8:00 a.m. to 4:30 p.m. Monday - Friday. Please note that voicemails left after 4:00 p.m. may not be returned until the following business day.  We are closed weekends and major holidays. You have access to a nurse at all times for urgent questions. Please call the main number to the clinic Dept: [phone] and follow the prompts.   For any non-urgent questions, you may also contact your provider using MyChart. We now offer e-Visits for anyone 65 and older to request care online for non-urgent symptoms. For details visit [email or web address].   Also download the MyChart app! Go to the app store, search "MyChart", open the app, select [facility], and log in with your MyChart username and password.  Masks are optional in the cancer centers. If you would like for your care team to wear a mask while they are taking care of you, please let them know. You may have one support person who is at least 68 years old accompany you for your appointments.

## 2022-08-18 NOTE — Progress Notes (Signed)
This encounter was created in error - please disregard.

## 2022-08-18 NOTE — Addendum Note (Signed)
Addended by: Sullivan Lone on: 08/18/2022 04:11 AM   Modules accepted: Orders, Level of Service

## 2022-08-18 NOTE — Progress Notes (Signed)
Daratumumab

## 2022-08-18 NOTE — Progress Notes (Signed)
Patient observed for 1 hour following Daratumumab-Hyaluronidase injection. No s/s of distress. Patient reports feeling normal. Injection site looks appropriate with no redness, streaking, or pain.

## 2022-08-23 ENCOUNTER — Other Ambulatory Visit: Payer: Self-pay | Admitting: Hematology

## 2022-08-23 ENCOUNTER — Other Ambulatory Visit: Payer: BLUE CROSS/BLUE SHIELD

## 2022-08-23 ENCOUNTER — Ambulatory Visit: Payer: BLUE CROSS/BLUE SHIELD

## 2022-08-23 ENCOUNTER — Ambulatory Visit: Payer: BLUE CROSS/BLUE SHIELD | Admitting: Hematology

## 2022-08-23 MED ORDER — LORAZEPAM 0.5 MG PO TABS: 0.5000 mg | ORAL_TABLET | Freq: Every evening | ORAL | 0 refills | Status: DC | PRN

## 2022-08-24 ENCOUNTER — Other Ambulatory Visit: Payer: Self-pay

## 2022-08-25 ENCOUNTER — Inpatient Hospital Stay: Payer: Medicare Other | Attending: Hematology

## 2022-08-25 ENCOUNTER — Other Ambulatory Visit (HOSPITAL_COMMUNITY): Payer: Self-pay

## 2022-08-25 ENCOUNTER — Encounter: Payer: Self-pay | Admitting: Hematology

## 2022-08-25 ENCOUNTER — Inpatient Hospital Stay: Payer: Medicare Other

## 2022-08-25 ENCOUNTER — Inpatient Hospital Stay (HOSPITAL_BASED_OUTPATIENT_CLINIC_OR_DEPARTMENT_OTHER): Payer: Medicare Other | Admitting: Hematology

## 2022-08-25 VITALS — BP 114/66 | HR 77 | Temp 97.9°F | Resp 16 | Ht 67.5 in | Wt 131.7 lb

## 2022-08-25 DIAGNOSIS — Z7982 Long term (current) use of aspirin: Secondary | ICD-10-CM | POA: Insufficient documentation

## 2022-08-25 DIAGNOSIS — Z5112 Encounter for antineoplastic immunotherapy: Secondary | ICD-10-CM | POA: Diagnosis present

## 2022-08-25 DIAGNOSIS — Z79899 Other long term (current) drug therapy: Secondary | ICD-10-CM | POA: Diagnosis not present

## 2022-08-25 DIAGNOSIS — C9001 Multiple myeloma in remission: Secondary | ICD-10-CM | POA: Insufficient documentation

## 2022-08-25 DIAGNOSIS — Z9481 Bone marrow transplant status: Secondary | ICD-10-CM | POA: Diagnosis not present

## 2022-08-25 LAB — CBC WITH DIFFERENTIAL (CANCER CENTER ONLY)
Abs Immature Granulocytes: 0.01 10*3/uL (ref 0.00–0.07)
Basophils Absolute: 0 10*3/uL (ref 0.0–0.1)
Basophils Relative: 1 %
Eosinophils Absolute: 0.1 10*3/uL (ref 0.0–0.5)
Eosinophils Relative: 3 %
HCT: 36.8 % (ref 36.0–46.0)
Hemoglobin: 12.3 g/dL (ref 12.0–15.0)
Immature Granulocytes: 0 %
Lymphocytes Relative: 11 %
Lymphs Abs: 0.5 10*3/uL — ABNORMAL LOW (ref 0.7–4.0)
MCH: 32.2 pg (ref 26.0–34.0)
MCHC: 33.4 g/dL (ref 30.0–36.0)
MCV: 96.3 fL (ref 80.0–100.0)
Monocytes Absolute: 0.8 10*3/uL (ref 0.1–1.0)
Monocytes Relative: 16 %
Neutro Abs: 3.5 10*3/uL (ref 1.7–7.7)
Neutrophils Relative %: 69 %
Platelet Count: 244 10*3/uL (ref 150–400)
RBC: 3.82 MIL/uL — ABNORMAL LOW (ref 3.87–5.11)
RDW: 14.3 % (ref 11.5–15.5)
WBC Count: 5 10*3/uL (ref 4.0–10.5)
nRBC: 0 % (ref 0.0–0.2)

## 2022-08-25 MED ORDER — MONTELUKAST SODIUM 10 MG PO TABS
10.0000 mg | ORAL_TABLET | Freq: Once | ORAL | Status: AC
  Administered 2022-08-25: 10 mg via ORAL
  Filled 2022-08-25: qty 1

## 2022-08-25 MED ORDER — DIPHENHYDRAMINE HCL 25 MG PO CAPS
50.0000 mg | ORAL_CAPSULE | Freq: Once | ORAL | Status: AC
  Administered 2022-08-25: 50 mg via ORAL
  Filled 2022-08-25: qty 2

## 2022-08-25 MED ORDER — DARATUMUMAB-HYALURONIDASE-FIHJ 1800-30000 MG-UT/15ML ~~LOC~~ SOLN
1800.0000 mg | Freq: Once | SUBCUTANEOUS | Status: AC
  Administered 2022-08-25: 1800 mg via SUBCUTANEOUS
  Filled 2022-08-25: qty 15

## 2022-08-25 MED ORDER — ACETAMINOPHEN 325 MG PO TABS
650.0000 mg | ORAL_TABLET | Freq: Once | ORAL | Status: AC
  Administered 2022-08-25: 650 mg via ORAL
  Filled 2022-08-25: qty 2

## 2022-08-25 MED ORDER — DEXAMETHASONE 4 MG PO TABS
20.0000 mg | ORAL_TABLET | Freq: Once | ORAL | Status: AC
  Administered 2022-08-25: 20 mg via ORAL
  Filled 2022-08-25: qty 5

## 2022-08-25 MED ORDER — LORAZEPAM 0.5 MG PO TABS
0.5000 mg | ORAL_TABLET | Freq: Every evening | ORAL | 0 refills | Status: DC | PRN
  Filled 2022-08-25: qty 30, 30d supply, fill #0

## 2022-08-25 MED ORDER — FAMOTIDINE 20 MG PO TABS
20.0000 mg | ORAL_TABLET | Freq: Once | ORAL | Status: AC
  Administered 2022-08-25: 20 mg via ORAL
  Filled 2022-08-25: qty 1

## 2022-08-25 NOTE — Progress Notes (Signed)
Patient seen by MD today  Vitals are within treatment parameters.  Labs reviewed: and are within treatment parameters."Dr Irene Limbo has seen lab results.  Per physician team, patient is ready for treatment and there are NO modifications to the treatment plan.

## 2022-08-25 NOTE — Progress Notes (Signed)
HEMATOLOGY/ONCOLOGY CLINIC NOTE  Date of Service: 08/25/22  PCP: [name], MD  CHIEF COMPLAINT: Follow-up for continued evaluation and management of multiple myeloma  DIAGNOSIS: IgA lambda R-ISS stage II multiple myeloma with innumerable lytic lesions in the calvarium, lesion in T7 vertebra and multiple lesions in the pelvis.  Diagnosed in January 2017.   Current treatment: Revlimid 42m po daily 3 weeks on 1 week off (after treatment interruption) Daratumumab cycle 1 day 15  Previous Treatment: Status post Vd x 1 cycle VRd x 6 cycles   HD Melphalan 2011mm2 on 03/22/2016 Autologous HSCT on 03/23/2016 with Dr [facility]ule [facility] 6.4 x10^6/kg)   INTERVAL HISTORY:  Paula Johnson a [age].o. female is here for continued evaluation and management of multiple myeloma and  Daratumumab cycle 1 day 15.   She was doing well at her last visit with me on 07/26/2022. No notable toxicities from her maintenance Revlimid. She has had a follow-up with her transplant team [facility] bone [facility] biopsy which showed MRD positive status with very low volume progression of her myeloma and has been recommended to start on Darzalex in addition to her maintenance Revlimid.  Today, she states that she has been doing well.   She has had some difficulty sleeping at night secondary to steroid use. However this has been well controlled with Ativan at night. She did run out of her Ativan recently as her prescription had run out. She feels that her lack of sleep did affect her mood over the last few weeks.  She denies any bowel changes or abdominal pain. She denies any mouth sores. She denies signs of infection such as sore throat, sinus drainage, cough, or urinary symptoms.  She denies fevers or recurrent chills. She denies pain. She denies nausea, vomiting, chest pain, dyspnea or cough. Her weight has been stable.  REVIEW OF SYSTEMS:   10 Point review of Systems was  done is negative except as noted above.  PHYSICAL EXAMINATION: .BP 114/66 (BP Location: Left Arm, Patient Position: Sitting)   Pulse 77   Temp 97.9 F (36.6 C) (Temporal)   Resp 16   Ht 5' 7.5" (1.715 m)   Wt 131 lb 11.2 oz (59.7 kg)   LMP 09/26/1995 (Within Months)   SpO2 100%   BMI 20.32 kg/m  NAD GENERAL:alert, in no acute distress and comfortable SKIN: no acute rashes, no significant lesions EYES: conjunctiva are pink and non-injected, sclera anicteric OROPHARYNX: MMM, no exudates, no oropharyngeal erythema or ulceration NECK: supple, no JVD LYMPH:  no palpable lymphadenopathy in the cervical, axillary or inguinal regions LUNGS: clear to auscultation b/l with normal respiratory effort HEART: regular rate & rhythm ABDOMEN:  normoactive bowel sounds , non tender, not distended. Extremity: no pedal edema PSYCH: alert & oriented x 3 with fluent speech NEURO: no focal motor/sensory deficits    MEDICAL HISTORY:   Past Medical History:  Diagnosis Date   Abdominal pain    Abnormal Pap smear of vagina 03/22/2017   ASCUS pap and negative HR HPV.  Patient is on immunosuppressive medication.    Allergic reaction to bee sting    Anemia    AR (aortic regurgitation) 11/30/2017   trace noted on ECHO   Atrial septal aneurysm per 11-30-17 echo   trivial pericardial effusion   Complicated migraine    Constipation    DDD (degenerative disc disease), cervical    DDD (degenerative disc disease), lumbosacral  Fatty liver    Female proctocele without uterine prolapse    Fibroid    reason for Hysterectomy   FTT (failure to thrive) in adult 08/09/2017   Gastroesophageal reflux disease without esophagitis 01/17/2017   Generalized anxiety disorder    Grade I diastolic dysfunction    Hammer toe 05/13/2018   Hematuria 11/08/2015   History of COVID-19 12/2020   History of stress incontinence    Hypergammaglobulinemia    Hyperlipidemia    Hypoxemia 12/16/2007   Insomnia    Leg  length discrepancy    Loss of appetite    Lung nodule    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   Metastatic multiple myeloma to bone 10/19/2015   Metatarsalgia of right foot 07/15/2018   MR (mitral regurgitation) 11/30/2017   trace noted on ECHO   Muscular fasciculation    Nasal septal deviation    Nasal turbinate hypertrophy    Near syncope    Obstructive sleep apnea 12/03/2007   Other fatigue    Peripheral edema    Plasma cell dyscrasia    PONV (postoperative nausea and vomiting)    thinks morphine caused nausea   Postoperative urinary retention 05/14/2018   Prediabetes    Pure hypercholesterolemia    Rash 11/01/2015   Restless leg syndrome    S/P autologous bone marrow transplantation 03/29/2016   Shortness of breath 12/04/2007   TR (tricuspid regurgitation) 11/30/2017   trace noted on ECHO   Urinary incontinence    UTI (urinary tract infection)    Vaginal granulation tissue 12/03/2017   Vitamin D deficiency    Wears glasses     SURGICAL HISTORY: Past Surgical History:  Procedure Laterality Date   ABDOMINAL HYSTERECTOMY  1997   TAH--ovaries remain--Dr. Newton Pigg   ANTERIOR AND POSTERIOR REPAIR N/A 05/14/2018   Procedure: ANTERIOR (CYSTOCELE) AND POSTERIOR REPAIR (RECTOCELE);  Surgeon: [name], MD;  Location: [facility];  Service: Gynecology;  Laterality: N/A;   BLADDER SUSPENSION N/A 05/14/2018   Procedure: TRANSVAGINAL TAPE (TVT) PROCEDURE exact midurethral sling;  Surgeon: [name], MD;  Location: [facility];  Service: Gynecology;  Laterality: N/A;   BONE MARROW TRANSPLANT  2017   done at [place] N/A 05/14/2018   Procedure: CYSTOSCOPY;  Surgeon: [name], MD;  Location: [facility];  Service: Gynecology;  Laterality: N/A;   CYSTOSCOPY N/A 05/30/2018   Procedure: CYSTOSCOPY;  Surgeon: [name], MD;  Location: [facility];  Service: Gynecology;   Laterality: N/A;   ROBOTIC ASSISTED LAPAROSCOPIC SACROCOLPOPEXY N/A 05/14/2018   Procedure: XI ROBOTIC ASSISTED LAPAROSCOPIC SACROCOLPOPEXY;  Surgeon: [name], MD;  Location: [facility];  Service: Gynecology;  Laterality: N/A;  6 hours OR time. Need extended stay recovery bed.   SALPINGOOPHORECTOMY Bilateral 05/14/2018   Procedure: SALPINGO OOPHORECTOMY;  Surgeon: [name], MD;  Location: [facility];  Service: Gynecology;  Laterality: Bilateral;   THYMECTOMY  1999   TRANSVAGINAL TAPE (TVT) REMOVAL N/A 05/30/2018   Procedure: TRANSVAGINAL TAPE (TVT)  Revision;  Surgeon: [name], MD;  Location[facility];  Service: Gynecology;  Laterality: N/A;   WISDOM TOOTH EXTRACTION      SOCIAL HISTORY: Social History   Socioeconomic History   Marital status: Married    Spouse name: [name]   Number of children: 2  Years of education: 82   Highest education level: Bachelor's degree (e.g., BA, AB, BS)  Occupational History   Occupation: Retired    Comment: Merchandiser, retail [employer]  Tobacco Use   Smoking status: Never   Smokeless tobacco: Never  Vaping Use   Vaping Use: Never used  Substance and Sexual Activity   Alcohol use: Not Currently    Alcohol/week: 0.0 standard drinks of alcohol   Drug use: Never   Sexual activity: Not Currently    Partners: Male    Birth control/protection: Surgical    Comment: TAH--ovaries remain  Other Topics Concern   Not on file  Social History Narrative   Lives with husband   Caffeine- coffee, 2 cups daily   Right handed   Social Determinants of Health   Financial Resource Strain: Not on file  Food Insecurity: Not on file  Transportation Needs: Not on file  Physical Activity: Not on file  Stress: Not on file  Social Connections: Not on file  Intimate Partner Violence: Not on file    FAMILY HISTORY: Family History  Problem Relation Age of Onset   Hyperlipidemia Mother    Memory loss  Mother        90s; believed to be age-related memory decline   COPD Father        dec age 38/s-smoked   Macular degeneration Father    Behavior problems Father        at the end of life   Diabetes Brother    Macular degeneration Brother    Suicidality Brother    Diabetes Maternal Grandmother    Heart attack Paternal Grandfather     ALLERGIES:  is allergic to ampicillin and penicillins.  MEDICATIONS:  . Current Outpatient Medications:    acyclovir (ZOVIRAX) 400 MG tablet, Take 1 tablet (400 mg total) by mouth 2 (two) times daily., Disp: 60 tablet, Rfl: 11   Ascorbic Acid (VITAMIN C) 1000 MG tablet, Take 1,000 mg by mouth 2 (two) times daily., Disp: , Rfl:    aspirin EC 81 MG tablet, Take 81 mg by mouth daily., Disp: , Rfl:    b complex vitamins capsule, Take 1 capsule by mouth daily., Disp: , Rfl:    Cholecalciferol (VITAMIN D3) 30 MCG/15ML LIQD, Take by mouth. Pt. Takes 5000 IU daily, Disp: , Rfl:    Cyanocobalamin (B-12) 1000 MCG CAPS, , Disp: , Rfl:    dexamethasone (DECADRON) 4 MG tablet, Take 1 tablet (4 mg total) by mouth daily. Take for 2 days starting the night of chemotherapy., Disp: 20 tablet, Rfl: 4   EPINEPHrine 0.3 mg/0.3 mL IJ SOAJ injection, INJECT INTRAMUSCULARLLY AS DIRECTED AS NEEDED FOR ANAPHYLAXIS, Disp: , Rfl:    estradiol (ESTRACE) 0.1 MG/GM vaginal cream, INSERT ONEGRAM VAGINALLY TWICE A WEEK AT BEDTIME, Disp: 43 g, Rfl: 1   lenalidomide (REVLIMID) 10 MG capsule, TAKE ONE CAPSULE BY MOUTH DAILY FOR 21 DAYS, THEN 7 DAYS OFF, Disp: 21 capsule, Rfl: 0   LORazepam (ATIVAN) 0.5 MG tablet, Take 1 tablet (0.5 mg total) by mouth at bedtime as needed for sleep or anxiety., Disp: 30 tablet, Rfl: 0   Magnesium 400 MG CAPS, Take by mouth., Disp: , Rfl:    ondansetron (ZOFRAN) 8 MG tablet, Take 1 tablet (8 mg total) by mouth every 8 (eight) hours as needed for nausea or vomiting., Disp: 30 tablet, Rfl: 1   prochlorperazine (COMPAZINE) 10 MG tablet, Take 1 tablet (10 mg total)  by mouth every 6 (six)  hours as needed for nausea or vomiting., Disp: 30 tablet, Rfl: 1   LABORATORY DATA:  I have reviewed the data as listed     Latest Ref Rng & Units 08/25/2022    1:56 PM 08/18/2022    8:51 AM 08/11/2022   11:30 AM  CBC  WBC 4.0 - 10.5 K/uL 5.0  3.5  4.4   Hemoglobin 12.0 - 15.0 g/dL 12.3  12.6  12.6   Hematocrit 36.0 - 46.0 % 36.8  37.4  37.5   Platelets 150 - 400 K/uL 244  276  307       Latest Ref Rng & Units 07/26/2022   11:52 AM 04/21/2022    1:24 PM 01/16/2022    1:50 PM  CMP  Glucose 70 - 99 mg/dL 86  99  86   BUN 8 - 23 mg/dL _0 Creatinine 0.44 - 1.00 mg/dL 0.80  1.24  0.84   Sodium 135 - 145 mmol/L 139  138  141   Potassium 3.5 - 5.1 mmol/L 3.8  4.3  3.5   Chloride 98 - 111 mmol/L 105  104  103   CO2 22 - 32 mmol/L _1 Calcium 8.9 - 10.3 mg/dL 9.6  9.4  9.6   Total Protein 6.5 - 8.1 g/dL 7.8  7.6  7.6   Total Bilirubin 0.3 - 1.2 mg/dL 0.9  1.1  0.9   Alkaline Phos 38 - 126 U/L 75  74  81   AST 15 - 41 U/L _2 ALT 0 - 44 U/L _3 10/10/2019 Flow Cytometry (Repeat BM Bx from [facility]):   10/07/18 Repeat BM Bx from [facility]:   Surgical Pathology                                Case: [id number]                               Authorizing Provider:  Richardson Landry, NP    Collected:           07/04/2022 1015             Ordering Location:     [facility]   Received:            07/04/2022 [place]                                    [facility]                                                   Pathologist:           Lelon Frohlich, MD                                                   Specimens:   A) - Bone Marrow, Aspirate  B) - Bone Marrow, Biopsy                                                                          C) - Bone Marrow, Clot                                                                 DIAGNOSIS    A-C. Peripheral blood and bone marrow (peripheral smear, aspirate, touch preparation, core biopsy, and clot section): - Low-level plasma cell neoplasm, see comment.   COMMENT: The core biopsy is 40% cellular. Cyclin-D1 immunohistochemical stain demonstrates very rare positive plasma cells (1%). Concurrent flow cytometry study ([id number]) shows a plasma cell population with an atypical immunophenotype, comprising approximately 0.04% of analyzed events.    Reviewed by resident/fellow Beola Cord  Electronically signed by Lelon Frohlich, MD on 07/10/2022 at 12:25 PM  Clinical Information   66 year old female with history of plasma cell myeloma.  Gross Examination   A. Peripheral blood and bone marrow labeled with patient's name and medical record number, one peripheral blood smear, three bone marrow aspirate smears and one bone marrow touch preparation are received.   B. "Bone marrow biopsy", in AZF.  Core biopsies of red-brown tissue 1.4 x 0.2 cm in aggregate are submitted in total as block B1 for decalcification.   C. "Bone marrow clot", in AZF.  A 1.8 x 1.8 x 0.3 cm aggregate of red-brown clot is submitted in total as block C1.    J.Elam/ Dr. Linzie Collin   Microscopic Examination        Lab Results  Component Value Date    WBC 5.2 07/04/2022    HGB 12.1 07/04/2022    PLT 364 07/04/2022    MCV 95 07/04/2022    RDWCV 14.6 (H) 07/04/2022    Peripheral blood and automated blood count:  RBCs: The red blood cells are adequate in number,  normocytic and normochromic, with minimal anisopoikilocytosis. WBCs: The white blood cells are adequate in number and show unremarkable morphology. Platelets: The platelets are adequate in number and show unremarkable morphology.   Bone marrow aspirate:  There are adequate cellular particles. M:E ratio is 3:1.  Granulopoiesis: Maturing myeloid elements are present and show complete maturation. Erythropoiesis:  Erythroid elements are present and  show complete maturation. Megakaryocytes: Megakaryocytes are adequate in number and show unremarkable morphology.  There are slightly increased plasma cells with unremarkable morphology.   Bone marrow touch preparation: Adequate marrow elements are present, without additional findings to the aspirate smears.   Prussian blue stained aspirate smear: 0 of 4 stainable iron is present. No ringed sideroblasts are identified.   Bone marrow core biopsy:  The core biopsy is adequate for evaluation. The cellularity is 40%. Maturing myeloid elements are present and show complete maturation. Maturing erythroid elements are present and show complete maturation.  Megakaryocytes are adequate in number and show unremarkable morphology. There are slightly increased plasma cells.   A reticulin stain of the core biopsy shows no  significant reticulin fibrosis.   Aspirate clot section: There are adequate particles present, without additional findings to the core biopsy.   Immunohistochemical stains for Cyclin-D1, CD138, Kappa CISH, and Lambda CISH are performed on the core biopsy section (block B1). CD138, kappa, and lambda in-situ hybridization demonstrates 5-10% predominantly polytypic plasma cells. Cyclin-D1 demonstrates very rare positive plasma cells (1%).  Bone Marrow Differential   500 CELL DIFF   Cell Type Cell Count Reference Range Cell Type Cell Count Reference Range Cell Type Cell Count Reference Range  Segs 23 (7-25) Lymphocytes 15 (3-20) Plasma Cells 8 (0-3.5)  Bands 3 (6-36)     Atypicals 0   RBC Precursors 22 (10-30)  Metas 2 (9-25) Lymphoblasts *   Pronormoblasts 0 (0-3)  Myelos 5 (8-15) Eosinophils 12 (0-4) M:E Ratio 3/1    Promyelos 1 (1-6) Basophils 0 (0-1) Iron 0 (Scale 0-4+)  Myeloblasts 1 (0-3.5) Monocytes 8 (0-2)                                                                                                     Performed by:    Tyrone Sage                                                                                                          July 04, 2022 12:21 PM  Additional Documentation   All immunohistochemistry, in situ hybridization tests and special stains performed at [facility] and reported herein were developed, validated and their performance characteristics determined by the [facility]. During the performance of these tests, appropriate positive and negative control slides are also performed and reviewed. All control slides and internal controls (when applicable) demonstrate the expected immunoreactive patterns and/or nucleic acid hybridization. These ancillary studies were deemed medically necessary by the requesting pathologist. They were ordered following review of the H&E and clinical history except when part of a liver/kidney protocol or where clinical history (e.g. immunocompromised, critically ill, history of malignancy) clearly indicates. Some of the tests may not be cleared or approved by the U.S. Food and Drug Administration (FDA).  The FDA has determined that such clearance or approval is not necessary.  These tests are used for clinical purposes and should not be regarded as investigational or as research.  This laboratory is certified under the [place] (CLIA) as qualified to perform high complexity clinical testing. At least one block in this case underwent decalcification during its processing. The vast majority of immunohistochemical and hybridization assays were not validated on decalcified tissues. The results were interpreted with caution given the possibility of false negative results on decalcified specimens.  Attestation   All of the diagnostic evaluations on the enumerated specimens have been personally conducted by the pathologists involved in the care of this patient as indicated by the electronic signatures above.  Resulting Agency [place] [facility]  CYTOPATHOLOGY   Specimen Collected: 07/04/22 10:15   Performed by: Hardtner Last Resulted: 07/10/22 12:25  Received From: [place]  Result Received: 07/14/22 09:58   View Encounter      Received Information Pathology - Bone Marrow (Order [id number])    suggestion  Information displayed in this report may not trend or trigger automated decision support.    Pathology - Bone Marrow Order: [id number] Component 4 wk ago  Case Report Surgical Pathology                                Case: [id number]                               Authorizing Provider:  Richardson Landry, NP    Collected:           07/04/2022 1015             Ordering Location:     [facility]   Received:            07/04/2022 [place]                                    [facility]                                                   Pathologist:           Lelon Frohlich, MD                                                   Specimens:   A) - Bone Marrow, Aspirate                                                                        B) - Bone Marrow, Biopsy                                                                          C) - Bone Marrow, Clot  DIAGNOSIS   A-C. Peripheral blood and bone marrow (peripheral smear, aspirate, touch preparation, core biopsy, and clot section): - Low-level plasma cell neoplasm, see comment.   COMMENT: The core biopsy is 40% cellular. Cyclin-D1 immunohistochemical stain demonstrates very rare positive plasma cells (1%). Concurrent flow cytometry study ([id number]) shows a plasma cell population with an atypical immunophenotype, comprising approximately 0.04% of analyzed events.    Reviewed by resident/fellow Beola Cord  Electronically signed by Lelon Frohlich, MD on 07/10/2022 at 12:25 PM  Clinical Information   66 year old female with  history of plasma cell myeloma.  Gross Examination   A. Peripheral blood and bone marrow labeled with patient's name and medical record number, one peripheral blood smear, three bone marrow aspirate smears and one bone marrow touch preparation are received.   B. "Bone marrow biopsy", in AZF.  Core biopsies of red-brown tissue 1.4 x 0.2 cm in aggregate are submitted in total as block B1 for decalcification.   C. "Bone marrow clot", in AZF.  A 1.8 x 1.8 x 0.3 cm aggregate of red-brown clot is submitted in total as block C1.    J.Elam/ Dr. Linzie Collin     RADIOGRAPHIC STUDIES: I have personally reviewed the radiological images as listed and agreed with the findings in the report. No results found.  ASSESSMENT & PLAN:   66 y.o. caucasian female with  1. H/o Multiple myeloma - currently in remission.  But bone marrow biopsy shows she has turned MRD positive  2. IgA lambda R-ISS stage II multiple myeloma with innumerable lytic lesions in the calvarium, lesion in T7 vertebra and multiple lesions in the pelvis.  No significant bone pain at this time. Diagnosed in January 2017.  s/p treatment as noted above 10/07/18 BM Bx and Flow cytometry indicate that the pt continues to be in CR 10/10/2019 Flow Cytometry revealed "A. Bone Marrow (Plasma Cell Myeloma Minimal Residual Disease Detection by Flow Cytometry): Negative. No phenotypically abnormal plasma cells at or above the limit of detection identified."  PLAN:  -Labs done today were discussed in detail with the patient. -CBC and CMP stable. -Recent myeloma labs showed no M spike and normal kappa lambda free light chains. -We discussed getting the RSV vaccine, she is otherwise UTD on immunizations. -Continue Vitamin D 5000 IU 5x per week and 10000 IU 2x per week. -Continue aspirin 81 mg p.o. daily -We will need to continue acyclovir 400 mg p.o. twice daily while on daratumumab. -Orders for subcutaneous daratumumab were placed.  FOLLOW UP:   Start subcutaneous daratumumab every 2 weeks for cycle 2 and continue maintenance Revlimid.   The total time spent in the appointment was 23 minutes* .  All of the patient's questions were answered with apparent satisfaction. The patient knows to call the clinic with any problems, questions or concerns.   I,Alexis Herring,acting as a Education administrator for Sullivan Lone, MD.,have documented all relevant documentation on the behalf of Sullivan Lone, MD,as directed by  Sullivan Lone, MD while in the presence of Sullivan Lone, MD.  .I have reviewed the above documentation for accuracy and completeness, and I agree with the above.  Sullivan Lone MD MS AAHIVMS [facility] Hematology/Oncology Physician [facility]  .*Total Encounter Time as defined by the Centers for Medicare and Medicaid Services includes, in addition to the face-to-face time of a patient visit (documented in the note above) non-face-to-face time: obtaining and reviewing outside history, ordering and reviewing medications, tests or procedures, care coordination (communications with other health  care professionals or caregivers) and documentation in the medical record.

## 2022-08-29 ENCOUNTER — Other Ambulatory Visit: Payer: Self-pay

## 2022-08-29 DIAGNOSIS — C9001 Multiple myeloma in remission: Secondary | ICD-10-CM

## 2022-08-29 MED ORDER — LENALIDOMIDE 10 MG PO CAPS: ORAL_CAPSULE | ORAL | 0 refills | Status: DC

## 2022-08-31 ENCOUNTER — Encounter: Payer: Self-pay | Admitting: Hematology

## 2022-09-01 ENCOUNTER — Inpatient Hospital Stay: Payer: Medicare Other | Admitting: Hematology

## 2022-09-01 ENCOUNTER — Other Ambulatory Visit: Payer: Self-pay | Admitting: Hematology

## 2022-09-01 ENCOUNTER — Inpatient Hospital Stay: Payer: Medicare Other

## 2022-09-01 VITALS — BP 116/74 | HR 82 | Temp 98.1°F | Resp 15 | Ht 67.5 in | Wt 130.9 lb

## 2022-09-01 DIAGNOSIS — C9001 Multiple myeloma in remission: Secondary | ICD-10-CM | POA: Diagnosis not present

## 2022-09-01 LAB — CMP (CANCER CENTER ONLY)
ALT: 37 U/L (ref 0–44)
AST: 25 U/L (ref 15–41)
Albumin: 4.1 g/dL (ref 3.5–5.0)
Alkaline Phosphatase: 78 U/L (ref 38–126)
Anion gap: 5 (ref 5–15)
BUN: 18 mg/dL (ref 8–23)
CO2: 29 mmol/L (ref 22–32)
Calcium: 9.8 mg/dL (ref 8.9–10.3)
Chloride: 103 mmol/L (ref 98–111)
Creatinine: 0.79 mg/dL (ref 0.44–1.00)
GFR, Estimated: >60 mL/min (ref >60)
Glucose, Bld: 90 mg/dL (ref 70–99)
Potassium: 4.1 mmol/L (ref 3.5–5.1)
Sodium: 137 mmol/L (ref 135–145)
Total Bilirubin: 1 mg/dL (ref 0.3–1.2)
Total Protein: 6.7 g/dL (ref 6.5–8.1)

## 2022-09-01 LAB — CBC WITH DIFFERENTIAL (CANCER CENTER ONLY)
Abs Immature Granulocytes: 0.01 10*3/uL (ref 0.00–0.07)
Basophils Absolute: 0 10*3/uL (ref 0.0–0.1)
Basophils Relative: 1 %
Eosinophils Absolute: 0.1 10*3/uL (ref 0.0–0.5)
Eosinophils Relative: 3 %
HCT: 38.7 % (ref 36.0–46.0)
Hemoglobin: 13 g/dL (ref 12.0–15.0)
Immature Granulocytes: 0 %
Lymphocytes Relative: 11 %
Lymphs Abs: 0.4 10*3/uL — ABNORMAL LOW (ref 0.7–4.0)
MCH: 31.9 pg (ref 26.0–34.0)
MCHC: 33.6 g/dL (ref 30.0–36.0)
MCV: 94.9 fL (ref 80.0–100.0)
Monocytes Absolute: 0.6 10*3/uL (ref 0.1–1.0)
Monocytes Relative: 18 %
Neutro Abs: 2.2 10*3/uL (ref 1.7–7.7)
Neutrophils Relative %: 67 %
Platelet Count: 230 10*3/uL (ref 150–400)
RBC: 4.08 MIL/uL (ref 3.87–5.11)
RDW: 14.4 % (ref 11.5–15.5)
WBC Count: 3.3 10*3/uL — ABNORMAL LOW (ref 4.0–10.5)
nRBC: 0 % (ref 0.0–0.2)

## 2022-09-01 MED ORDER — FAMOTIDINE 20 MG PO TABS
20.0000 mg | ORAL_TABLET | Freq: Once | ORAL | Status: AC
  Administered 2022-09-01: 20 mg via ORAL
  Filled 2022-09-01: qty 1

## 2022-09-01 MED ORDER — DEXAMETHASONE 4 MG PO TABS
20.0000 mg | ORAL_TABLET | Freq: Once | ORAL | Status: AC
  Administered 2022-09-01: 20 mg via ORAL
  Filled 2022-09-01: qty 5

## 2022-09-01 MED ORDER — ACETAMINOPHEN 325 MG PO TABS
650.0000 mg | ORAL_TABLET | Freq: Once | ORAL | Status: AC
  Administered 2022-09-01: 650 mg via ORAL
  Filled 2022-09-01: qty 2

## 2022-09-01 MED ORDER — DARATUMUMAB-HYALURONIDASE-FIHJ 1800-30000 MG-UT/15ML ~~LOC~~ SOLN
1800.0000 mg | Freq: Once | SUBCUTANEOUS | Status: AC
  Administered 2022-09-01: 1800 mg via SUBCUTANEOUS
  Filled 2022-09-01: qty 15

## 2022-09-01 MED ORDER — DIPHENHYDRAMINE HCL 25 MG PO CAPS
25.0000 mg | ORAL_CAPSULE | Freq: Once | ORAL | Status: AC
  Administered 2022-09-01: 25 mg via ORAL
  Filled 2022-09-01: qty 1

## 2022-09-01 MED ORDER — DIPHENHYDRAMINE HCL 25 MG PO CAPS: 50.0000 mg | ORAL_CAPSULE | Freq: Once | ORAL | Status: DC

## 2022-09-01 NOTE — Progress Notes (Signed)
Patient seen by  Not seen by MD today  Vitals are within treatment parameters.  Labs reviewed: and are within treatment parameters."  Per physician team, patient is ready for treatment and there are NO modifications to the treatment plan.  

## 2022-09-01 NOTE — Patient Instructions (Signed)
[facility]  Discharge Instructions: Thank you for choosing [facility] to provide your oncology and hematology care.   If you have a lab appointment with the [facility], please go directly to the [facility] and check in at the registration area.   Wear comfortable clothing and clothing appropriate for easy access to any Portacath or PICC line.   We strive to give you quality time with your provider. You may need to reschedule your appointment if you arrive late (15 or more minutes).  Arriving late affects you and other patients whose appointments are after yours.  Also, if you miss three or more appointments without notifying the office, you may be dismissed from the clinic at the provider's discretion.      For prescription refill requests, have your pharmacy contact our office and allow 72 hours for refills to be completed.    Today you received the following chemotherapy and/or immunotherapy agents: Darzalex Faspro      To help prevent nausea and vomiting after your treatment, we encourage you to take your nausea medication as directed.  BELOW ARE SYMPTOMS THAT SHOULD BE REPORTED IMMEDIATELY: *FEVER GREATER THAN 100.4 F (38 C) OR HIGHER *CHILLS OR SWEATING *NAUSEA AND VOMITING THAT IS NOT CONTROLLED WITH YOUR NAUSEA MEDICATION *UNUSUAL SHORTNESS OF BREATH *UNUSUAL BRUISING OR BLEEDING *URINARY PROBLEMS (pain or burning when urinating, or frequent urination) *BOWEL PROBLEMS (unusual diarrhea, constipation, pain near the anus) TENDERNESS IN MOUTH AND THROAT WITH OR WITHOUT PRESENCE OF ULCERS (sore throat, sores in mouth, or a toothache) UNUSUAL RASH, SWELLING OR PAIN  UNUSUAL VAGINAL DISCHARGE OR ITCHING   Items with * indicate a potential emergency and should be followed up as soon as possible or go to the Emergency Department if any problems should occur.  Please show the CHEMOTHERAPY ALERT CARD or IMMUNOTHERAPY ALERT CARD at  check-in to the Emergency Department and triage nurse.  Should you have questions after your visit or need to cancel or reschedule your appointment, please contact [facility]  Dept: [phone]  and follow the prompts.  Office hours are 8:00 a.m. to 4:30 p.m. Monday - Friday. Please note that voicemails left after 4:00 p.m. may not be returned until the following business day.  We are closed weekends and major holidays. You have access to a nurse at all times for urgent questions. Please call the main number to the clinic Dept: [phone] and follow the prompts.   For any non-urgent questions, you may also contact your provider using MyChart. We now offer e-Visits for anyone 81 and older to request care online for non-urgent symptoms. For details visit [email or web address].   Also download the MyChart app! Go to the app store, search "MyChart", open the app, select [facility], and log in with your MyChart username and password.  Masks are optional in the cancer centers. If you would like for your care team to wear a mask while they are taking care of you, please let them know. You may have one support person who is at least 68 years old accompany you for your appointments.

## 2022-09-07 ENCOUNTER — Encounter: Payer: Self-pay | Admitting: Hematology

## 2022-09-07 LAB — MULTIPLE MYELOMA PANEL, SERUM
Albumin SerPl Elph-Mcnc: 3.6 g/dL (ref 2.9–4.4)
Albumin/Glob SerPl: 1.5 (ref 0.7–1.7)
Alpha 1: 0.2 g/dL (ref 0.0–0.4)
Alpha2 Glob SerPl Elph-Mcnc: 0.7 g/dL (ref 0.4–1.0)
B-Globulin SerPl Elph-Mcnc: 1 g/dL (ref 0.7–1.3)
Gamma Glob SerPl Elph-Mcnc: 0.6 g/dL (ref 0.4–1.8)
Globulin, Total: 2.5 g/dL (ref 2.2–3.9)
IgA: 175 mg/dL (ref 87–352)
IgG (Immunoglobin G), Serum: 752 mg/dL (ref 586–1602)
IgM (Immunoglobulin M), Srm: 29 mg/dL (ref 26–217)
M Protein SerPl Elph-Mcnc: 0.2 g/dL — ABNORMAL HIGH
Total Protein ELP: 6.1 g/dL (ref 6.0–8.5)

## 2022-09-08 ENCOUNTER — Inpatient Hospital Stay: Payer: Medicare Other

## 2022-09-08 ENCOUNTER — Other Ambulatory Visit: Payer: Self-pay

## 2022-09-08 VITALS — BP 148/83 | HR 99 | Temp 98.2°F | Resp 16 | Wt 132.0 lb

## 2022-09-08 DIAGNOSIS — C9001 Multiple myeloma in remission: Secondary | ICD-10-CM

## 2022-09-08 LAB — CBC WITH DIFFERENTIAL (CANCER CENTER ONLY)
Abs Immature Granulocytes: 0.01 10*3/uL (ref 0.00–0.07)
Basophils Absolute: 0 10*3/uL (ref 0.0–0.1)
Basophils Relative: 0 %
Eosinophils Absolute: 0 10*3/uL (ref 0.0–0.5)
Eosinophils Relative: 0 %
HCT: 37.6 % (ref 36.0–46.0)
Hemoglobin: 12.7 g/dL (ref 12.0–15.0)
Immature Granulocytes: 0 %
Lymphocytes Relative: 4 %
Lymphs Abs: 0.2 10*3/uL — ABNORMAL LOW (ref 0.7–4.0)
MCH: 31.9 pg (ref 26.0–34.0)
MCHC: 33.8 g/dL (ref 30.0–36.0)
MCV: 94.5 fL (ref 80.0–100.0)
Monocytes Absolute: 0 10*3/uL — ABNORMAL LOW (ref 0.1–1.0)
Monocytes Relative: 1 %
Neutro Abs: 4.5 10*3/uL (ref 1.7–7.7)
Neutrophils Relative %: 95 %
Platelet Count: 297 10*3/uL (ref 150–400)
RBC: 3.98 MIL/uL (ref 3.87–5.11)
RDW: 14.4 % (ref 11.5–15.5)
WBC Count: 4.7 10*3/uL (ref 4.0–10.5)
nRBC: 0 % (ref 0.0–0.2)

## 2022-09-08 LAB — CMP (CANCER CENTER ONLY)
ALT: 28 U/L (ref 0–44)
AST: 18 U/L (ref 15–41)
Albumin: 4.2 g/dL (ref 3.5–5.0)
Alkaline Phosphatase: 74 U/L (ref 38–126)
Anion gap: 10 (ref 5–15)
BUN: 19 mg/dL (ref 8–23)
CO2: 25 mmol/L (ref 22–32)
Calcium: 9.7 mg/dL (ref 8.9–10.3)
Chloride: 104 mmol/L (ref 98–111)
Creatinine: 0.81 mg/dL (ref 0.44–1.00)
GFR, Estimated: >60 mL/min (ref >60)
Glucose, Bld: 217 mg/dL — ABNORMAL HIGH (ref 70–99)
Potassium: 3.9 mmol/L (ref 3.5–5.1)
Sodium: 139 mmol/L (ref 135–145)
Total Bilirubin: 0.6 mg/dL (ref 0.3–1.2)
Total Protein: 6.5 g/dL (ref 6.5–8.1)

## 2022-09-08 MED ORDER — DEXAMETHASONE 4 MG PO TABS
20.0000 mg | ORAL_TABLET | Freq: Once | ORAL | Status: DC
  Filled 2022-09-08: qty 5

## 2022-09-08 MED ORDER — ACETAMINOPHEN 325 MG PO TABS
650.0000 mg | ORAL_TABLET | Freq: Once | ORAL | Status: AC
  Administered 2022-09-08: 650 mg via ORAL
  Filled 2022-09-08: qty 2

## 2022-09-08 MED ORDER — FAMOTIDINE 20 MG PO TABS
20.0000 mg | ORAL_TABLET | Freq: Once | ORAL | Status: AC
  Administered 2022-09-08: 20 mg via ORAL
  Filled 2022-09-08: qty 1

## 2022-09-08 MED ORDER — DARATUMUMAB-HYALURONIDASE-FIHJ 1800-30000 MG-UT/15ML ~~LOC~~ SOLN
1800.0000 mg | Freq: Once | SUBCUTANEOUS | Status: AC
  Administered 2022-09-08: 1800 mg via SUBCUTANEOUS
  Filled 2022-09-08: qty 15

## 2022-09-08 MED ORDER — DIPHENHYDRAMINE HCL 25 MG PO CAPS
25.0000 mg | ORAL_CAPSULE | Freq: Once | ORAL | Status: AC
  Administered 2022-09-08: 25 mg via ORAL
  Filled 2022-09-08: qty 1

## 2022-09-08 NOTE — Patient Instructions (Signed)
[facility]  Discharge Instructions: Thank you for choosing [facility] to provide your oncology and hematology care.   If you have a lab appointment with the [facility], please go directly to the [facility] and check in at the registration area.   Wear comfortable clothing and clothing appropriate for easy access to any Portacath or PICC line.   We strive to give you quality time with your provider. You may need to reschedule your appointment if you arrive late (15 or more minutes).  Arriving late affects you and other patients whose appointments are after yours.  Also, if you miss three or more appointments without notifying the office, you may be dismissed from the clinic at the provider's discretion.      For prescription refill requests, have your pharmacy contact our office and allow 72 hours for refills to be completed.    Today you received the following chemotherapy and/or immunotherapy agents: Darzalex Faspro      To help prevent nausea and vomiting after your treatment, we encourage you to take your nausea medication as directed.  BELOW ARE SYMPTOMS THAT SHOULD BE REPORTED IMMEDIATELY: *FEVER GREATER THAN 100.4 F (38 C) OR HIGHER *CHILLS OR SWEATING *NAUSEA AND VOMITING THAT IS NOT CONTROLLED WITH YOUR NAUSEA MEDICATION *UNUSUAL SHORTNESS OF BREATH *UNUSUAL BRUISING OR BLEEDING *URINARY PROBLEMS (pain or burning when urinating, or frequent urination) *BOWEL PROBLEMS (unusual diarrhea, constipation, pain near the anus) TENDERNESS IN MOUTH AND THROAT WITH OR WITHOUT PRESENCE OF ULCERS (sore throat, sores in mouth, or a toothache) UNUSUAL RASH, SWELLING OR PAIN  UNUSUAL VAGINAL DISCHARGE OR ITCHING   Items with * indicate a potential emergency and should be followed up as soon as possible or go to the Emergency Department if any problems should occur.  Please show the CHEMOTHERAPY ALERT CARD or IMMUNOTHERAPY ALERT CARD at  check-in to the Emergency Department and triage nurse.  Should you have questions after your visit or need to cancel or reschedule your appointment, please contact [facility]  Dept: [phone]  and follow the prompts.  Office hours are 8:00 a.m. to 4:30 p.m. Monday - Friday. Please note that voicemails left after 4:00 p.m. may not be returned until the following business day.  We are closed weekends and major holidays. You have access to a nurse at all times for urgent questions. Please call the main number to the clinic Dept: [phone] and follow the prompts.   For any non-urgent questions, you may also contact your provider using MyChart. We now offer e-Visits for anyone 21 and older to request care online for non-urgent symptoms. For details visit [email or web address].   Also download the MyChart app! Go to the app store, search "MyChart", open the app, select [facility], and log in with your MyChart username and password.  Masks are optional in the cancer centers. If you would like for your care team to wear a mask while they are taking care of you, please let them know. You may have one support person who is at least 68 years old accompany you for your appointments.

## 2022-09-08 NOTE — Progress Notes (Signed)
Patient took her dexamethasone today at 67.

## 2022-09-08 NOTE — Progress Notes (Signed)
Pt here for an appointment at [facility]. Let pt know per Dr Lorenso Courier her M spike in her lab work was related to pt's current treatment. Pt acknowledged information and verbalized understanding.

## 2022-09-12 ENCOUNTER — Encounter: Payer: Self-pay | Admitting: Hematology

## 2022-09-12 NOTE — Progress Notes (Signed)
This encounter was created in error - please disregard.

## 2022-09-14 LAB — MULTIPLE MYELOMA PANEL, SERUM
Albumin SerPl Elph-Mcnc: 3.7 g/dL (ref 2.9–4.4)
Albumin/Glob SerPl: 1.4 (ref 0.7–1.7)
Alpha 1: 0.3 g/dL (ref 0.0–0.4)
Alpha2 Glob SerPl Elph-Mcnc: 0.8 g/dL (ref 0.4–1.0)
B-Globulin SerPl Elph-Mcnc: 1.1 g/dL (ref 0.7–1.3)
Gamma Glob SerPl Elph-Mcnc: 0.7 g/dL (ref 0.4–1.8)
Globulin, Total: 2.8 g/dL (ref 2.2–3.9)
IgA: 156 mg/dL (ref 87–352)
IgG (Immunoglobin G), Serum: 733 mg/dL (ref 586–1602)
IgM (Immunoglobulin M), Srm: 27 mg/dL (ref 26–217)
M Protein SerPl Elph-Mcnc: 0.2 g/dL — ABNORMAL HIGH
Total Protein ELP: 6.5 g/dL (ref 6.0–8.5)

## 2022-09-15 ENCOUNTER — Ambulatory Visit: Payer: BLUE CROSS/BLUE SHIELD

## 2022-09-15 ENCOUNTER — Other Ambulatory Visit: Payer: BLUE CROSS/BLUE SHIELD

## 2022-09-22 ENCOUNTER — Other Ambulatory Visit: Payer: Self-pay

## 2022-09-22 ENCOUNTER — Inpatient Hospital Stay: Payer: Medicare Other | Attending: Hematology

## 2022-09-22 ENCOUNTER — Inpatient Hospital Stay: Payer: Medicare Other

## 2022-09-22 ENCOUNTER — Inpatient Hospital Stay (HOSPITAL_BASED_OUTPATIENT_CLINIC_OR_DEPARTMENT_OTHER): Payer: Medicare Other | Admitting: Hematology

## 2022-09-22 VITALS — BP 115/61 | HR 85 | Temp 97.9°F | Resp 16 | Ht 67.5 in | Wt 131.7 lb

## 2022-09-22 DIAGNOSIS — C9001 Multiple myeloma in remission: Secondary | ICD-10-CM

## 2022-09-22 DIAGNOSIS — Z5112 Encounter for antineoplastic immunotherapy: Secondary | ICD-10-CM

## 2022-09-22 DIAGNOSIS — Z79899 Other long term (current) drug therapy: Secondary | ICD-10-CM | POA: Diagnosis not present

## 2022-09-22 LAB — CBC WITH DIFFERENTIAL (CANCER CENTER ONLY)
Abs Immature Granulocytes: 0.02 10*3/uL (ref 0.00–0.07)
Basophils Absolute: 0 10*3/uL (ref 0.0–0.1)
Basophils Relative: 1 %
Eosinophils Absolute: 0.1 10*3/uL (ref 0.0–0.5)
Eosinophils Relative: 2 %
HCT: 37.4 % (ref 36.0–46.0)
Hemoglobin: 12.8 g/dL (ref 12.0–15.0)
Immature Granulocytes: 0 %
Lymphocytes Relative: 8 %
Lymphs Abs: 0.4 10*3/uL — ABNORMAL LOW (ref 0.7–4.0)
MCH: 32.5 pg (ref 26.0–34.0)
MCHC: 34.2 g/dL (ref 30.0–36.0)
MCV: 94.9 fL (ref 80.0–100.0)
Monocytes Absolute: 0.5 10*3/uL (ref 0.1–1.0)
Monocytes Relative: 11 %
Neutro Abs: 3.7 10*3/uL (ref 1.7–7.7)
Neutrophils Relative %: 78 %
Platelet Count: 270 10*3/uL (ref 150–400)
RBC: 3.94 MIL/uL (ref 3.87–5.11)
RDW: 14.3 % (ref 11.5–15.5)
WBC Count: 4.8 10*3/uL (ref 4.0–10.5)
nRBC: 0 % (ref 0.0–0.2)

## 2022-09-22 LAB — COMPREHENSIVE METABOLIC PANEL
ALT: 21 U/L (ref 0–44)
AST: 17 U/L (ref 15–41)
Albumin: 4.1 g/dL (ref 3.5–5.0)
Alkaline Phosphatase: 60 U/L (ref 38–126)
Anion gap: 5 (ref 5–15)
BUN: 20 mg/dL (ref 8–23)
CO2: 29 mmol/L (ref 22–32)
Calcium: 9.7 mg/dL (ref 8.9–10.3)
Chloride: 104 mmol/L (ref 98–111)
Creatinine, Ser: 0.73 mg/dL (ref 0.44–1.00)
GFR, Estimated: >60 mL/min (ref >60)
Glucose, Bld: 92 mg/dL (ref 70–99)
Potassium: 4.1 mmol/L (ref 3.5–5.1)
Sodium: 138 mmol/L (ref 135–145)
Total Bilirubin: 0.6 mg/dL (ref 0.3–1.2)
Total Protein: 6.6 g/dL (ref 6.5–8.1)

## 2022-09-22 MED ORDER — DIPHENHYDRAMINE HCL 25 MG PO CAPS
25.0000 mg | ORAL_CAPSULE | Freq: Once | ORAL | Status: AC
  Administered 2022-09-22: 25 mg via ORAL
  Filled 2022-09-22: qty 1

## 2022-09-22 MED ORDER — DARATUMUMAB-HYALURONIDASE-FIHJ 1800-30000 MG-UT/15ML ~~LOC~~ SOLN
1800.0000 mg | Freq: Once | SUBCUTANEOUS | Status: AC
  Administered 2022-09-22: 1800 mg via SUBCUTANEOUS
  Filled 2022-09-22: qty 15

## 2022-09-22 MED ORDER — DEXAMETHASONE 4 MG PO TABS: 4.0000 mg | ORAL_TABLET | Freq: Every day | ORAL | 4 refills | Status: DC

## 2022-09-22 MED ORDER — FAMOTIDINE 20 MG PO TABS
20.0000 mg | ORAL_TABLET | Freq: Once | ORAL | Status: AC
  Administered 2022-09-22: 20 mg via ORAL
  Filled 2022-09-22: qty 1

## 2022-09-22 MED ORDER — DEXAMETHASONE 4 MG PO TABS: 20.0000 mg | ORAL_TABLET | Freq: Once | ORAL | Status: DC

## 2022-09-22 MED ORDER — ACETAMINOPHEN 325 MG PO TABS
650.0000 mg | ORAL_TABLET | Freq: Once | ORAL | Status: AC
  Administered 2022-09-22: 650 mg via ORAL
  Filled 2022-09-22: qty 2

## 2022-09-22 NOTE — Patient Instructions (Signed)
[facility]  Discharge Instructions: Thank you for choosing [facility] to provide your oncology and hematology care.   If you have a lab appointment with the [facility], please go directly to the [facility] and check in at the registration area.   Wear comfortable clothing and clothing appropriate for easy access to any Portacath or PICC line.   We strive to give you quality time with your provider. You may need to reschedule your appointment if you arrive late (15 or more minutes).  Arriving late affects you and other patients whose appointments are after yours.  Also, if you miss three or more appointments without notifying the office, you may be dismissed from the clinic at the provider's discretion.      For prescription refill requests, have your pharmacy contact our office and allow 72 hours for refills to be completed.    Today you received the following chemotherapy and/or immunotherapy agents: Darzalex Faspro      To help prevent nausea and vomiting after your treatment, we encourage you to take your nausea medication as directed.  BELOW ARE SYMPTOMS THAT SHOULD BE REPORTED IMMEDIATELY: *FEVER GREATER THAN 100.4 F (38 C) OR HIGHER *CHILLS OR SWEATING *NAUSEA AND VOMITING THAT IS NOT CONTROLLED WITH YOUR NAUSEA MEDICATION *UNUSUAL SHORTNESS OF BREATH *UNUSUAL BRUISING OR BLEEDING *URINARY PROBLEMS (pain or burning when urinating, or frequent urination) *BOWEL PROBLEMS (unusual diarrhea, constipation, pain near the anus) TENDERNESS IN MOUTH AND THROAT WITH OR WITHOUT PRESENCE OF ULCERS (sore throat, sores in mouth, or a toothache) UNUSUAL RASH, SWELLING OR PAIN  UNUSUAL VAGINAL DISCHARGE OR ITCHING   Items with * indicate a potential emergency and should be followed up as soon as possible or go to the Emergency Department if any problems should occur.  Please show the CHEMOTHERAPY ALERT CARD or IMMUNOTHERAPY ALERT CARD at  check-in to the Emergency Department and triage nurse.  Should you have questions after your visit or need to cancel or reschedule your appointment, please contact [facility]  Dept: [phone]  and follow the prompts.  Office hours are 8:00 a.m. to 4:30 p.m. Monday - Friday. Please note that voicemails left after 4:00 p.m. may not be returned until the following business day.  We are closed weekends and major holidays. You have access to a nurse at all times for urgent questions. Please call the main number to the clinic Dept: [phone] and follow the prompts.   For any non-urgent questions, you may also contact your provider using MyChart. We now offer e-Visits for anyone 54 and older to request care online for non-urgent symptoms. For details visit [email or web address].   Also download the MyChart app! Go to the app store, search "MyChart", open the app, select [facility], and log in with your MyChart username and password.  Masks are optional in the cancer centers. If you would like for your care team to wear a mask while they are taking care of you, please let them know. You may have one support person who is at least 68 years old accompany you for your appointments.

## 2022-09-22 NOTE — Progress Notes (Signed)
Patient seen by MD today  Vitals are within treatment parameters.  Labs reviewed: and are within treatment parameters.  Per physician team, patient is ready for treatment and there are NO modifications to the treatment plan.  

## 2022-09-26 ENCOUNTER — Other Ambulatory Visit: Payer: Self-pay | Admitting: *Deleted

## 2022-09-26 DIAGNOSIS — C9001 Multiple myeloma in remission: Secondary | ICD-10-CM

## 2022-09-26 MED ORDER — LENALIDOMIDE 10 MG PO CAPS: ORAL_CAPSULE | ORAL | 0 refills | Status: DC

## 2022-09-26 NOTE — Telephone Encounter (Signed)
Revlimid refilled per MD 08/25/22 OV notes to continue at same dose.

## 2022-09-27 ENCOUNTER — Other Ambulatory Visit: Payer: Self-pay

## 2022-09-28 ENCOUNTER — Encounter: Payer: Self-pay | Admitting: Hematology

## 2022-09-28 ENCOUNTER — Other Ambulatory Visit: Payer: Self-pay

## 2022-09-28 DIAGNOSIS — C9001 Multiple myeloma in remission: Secondary | ICD-10-CM

## 2022-09-28 MED ORDER — LENALIDOMIDE 10 MG PO CAPS: ORAL_CAPSULE | ORAL | 0 refills | Status: DC

## 2022-09-28 NOTE — Progress Notes (Signed)
HEMATOLOGY/ONCOLOGY CLINIC NOTE  Date of Service: 09/22/2022   PCP: [name], MD  CHIEF COMPLAINT: Follow-up for continued evaluation and management of multiple myeloma  DIAGNOSIS: IgA lambda R-ISS stage II multiple myeloma with innumerable lytic lesions in the calvarium, lesion in T7 vertebra and multiple lesions in the pelvis.  Diagnosed in January 2017.   Current treatment: Revlimid 63m po daily 3 weeks on 1 week off (after treatment interruption) Daratumumab cycle 1 day 15  Previous Treatment: Status post Vd x 1 cycle VRd x 6 cycles   HD Melphalan 2010mm2 on 03/22/2016 Autologous HS[facility] on 03/23/2016 [facility]   INTERVAL HISTORY:   Ms. CoPaughs a [age].o. female is here for continued evaluation and management of multiple myeloma and  Daratumumab cycle 2 day 15.   Patient notes no acute intolerances or toxicities from her current regimen of daratumumab/maintenance Revlimid and dexamethasone. No new infection issues. Had a good Christmas and Thanksgiving. Labs done today were discussed in detail with the patient.  REVIEW OF SYSTEMS:   10 Point review of Systems was done is negative except as noted above.  PHYSICAL EXAMINATION: .BP 114/66 (BP Location: Left Arm, Patient Position: Sitting)   Pulse 77   Temp 97.9 F (36.6 C) (Temporal)   Resp 16   Ht 5' 7.5" (1.715 m)   Wt 131 lb 11.2 oz (59.7 kg)   LMP 09/26/1995 (Within Months)   SpO2 100%   BMI 20.32 kg/m  NAD GENERAL:alert, in no acute distress and comfortable SKIN: no acute rashes, no significant lesions EYES: conjunctiva are pink and non-injected, sclera anicteric OROPHARYNX: MMM, no exudates, no oropharyngeal erythema or ulceration NECK: supple, no JVD LYMPH:  no palpable lymphadenopathy in the cervical, axillary or inguinal regions LUNGS: clear to auscultation b/l with normal respiratory effort HEART: regular rate & rhythm ABDOMEN:  normoactive bowel  sounds , non tender, not distended. Extremity: no pedal edema PSYCH: alert & oriented x 3 with fluent speech NEURO: no focal motor/sensory deficits     MEDICAL HISTORY:   Past Medical History:  Diagnosis Date   Abdominal pain    Abnormal Pap smear of vagina 03/22/2017   ASCUS pap and negative HR HPV.  Patient is on immunosuppressive medication.    Allergic reaction to bee sting    Anemia    AR (aortic regurgitation) 11/30/2017   trace noted on ECHO   Atrial septal aneurysm per 11-30-17 echo   trivial pericardial effusion   Complicated migraine    Constipation    DDD (degenerative disc disease), cervical    DDD (degenerative disc disease), lumbosacral    Fatty liver    Female proctocele without uterine prolapse    Fibroid    reason for Hysterectomy   FTT (failure to thrive) in adult 08/09/2017   Gastroesophageal reflux disease without esophagitis 01/17/2017   Generalized anxiety disorder    Grade I diastolic dysfunction    Hammer toe 05/13/2018   Hematuria 11/08/2015   History of COVID-19 12/2020   History of stress incontinence    Hypergammaglobulinemia    Hyperlipidemia    Hypoxemia 12/16/2007   Insomnia    Leg length discrepancy    Loss of appetite    Lung nodule    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   Metastatic multiple myeloma to bone 10/19/2015   Metatarsalgia of right foot 07/15/2018   MR (mitral regurgitation) 11/30/2017   trace noted on ECHO   Muscular fasciculation  Nasal septal deviation    Nasal turbinate hypertrophy    Near syncope    Obstructive sleep apnea 12/03/2007   Other fatigue    Peripheral edema    Plasma cell dyscrasia    PONV (postoperative nausea and vomiting)    thinks morphine caused nausea   Postoperative urinary retention 05/14/2018   Prediabetes    Pure hypercholesterolemia    Rash 11/01/2015   Restless leg syndrome    S/P autologous bone marrow transplantation 03/29/2016   Shortness of breath 12/04/2007   TR  (tricuspid regurgitation) 11/30/2017   trace noted on ECHO   Urinary incontinence    UTI (urinary tract infection)    Vaginal granulation tissue 12/03/2017   Vitamin D deficiency    Wears glasses     SURGICAL HISTORY: Past Surgical History:  Procedure Laterality Date   ABDOMINAL HYSTERECTOMY  1997   TAH--ovaries remain--Dr. Newton Pigg   ANTERIOR AND POSTERIOR REPAIR N/A 05/14/2018   Procedure: ANTERIOR (CYSTOCELE) AND POSTERIOR REPAIR (RECTOCELE);  Surgeon: [name], MD;  Location: [facility];  Service: Gynecology;  Laterality: N/A;   BLADDER SUSPENSION N/A 05/14/2018   Procedure: TRANSVAGINAL TAPE (TVT) PROCEDURE exact midurethral sling;  Surgeon: [name], MD;  Location: [facility];  Service: Gynecology;  Laterality: N/A;   BONE MARROW TRANSPLANT  2017   done at [place] N/A 05/14/2018   Procedure: CYSTOSCOPY;  Surgeon: [name], MD;  Location: [facility];  Service: Gynecology;  Laterality: N/A;   CYSTOSCOPY N/A 05/30/2018   Procedure: CYSTOSCOPY;  Surgeon: [name], MD;  Location: [facility];  Service: Gynecology;  Laterality: N/A;   ROBOTIC ASSISTED LAPAROSCOPIC SACROCOLPOPEXY N/A 05/14/2018   Procedure: XI ROBOTIC ASSISTED LAPAROSCOPIC SACROCOLPOPEXY;  Surgeon: [name], MD;  Location: [facility];  Service: Gynecology;  Laterality: N/A;  6 hours OR time. Need extended stay recovery bed.   SALPINGOOPHORECTOMY Bilateral 05/14/2018   Procedure: SALPINGO OOPHORECTOMY;  Surgeon: [name], MD;  Location: [facility];  Service: Gynecology;  Laterality: Bilateral;   THYMECTOMY  1999   TRANSVAGINAL TAPE (TVT) REMOVAL N/A 05/30/2018   Procedure: TRANSVAGINAL TAPE (TVT)  Revision;  Surgeon: [name], MD;  Location[facility];  Service: Gynecology;  Laterality: N/A;   WISDOM TOOTH EXTRACTION      SOCIAL HISTORY: Social  History   Socioeconomic History   Marital status: Married    Spouse name: [name]   Number of children: 2   Years of education: 16   Highest education level: Bachelor's degree (e.g., BA, AB, BS)  Occupational History   Occupation: Retired    Comment: Merchandiser, retail [employer]  Tobacco Use   Smoking status: Never   Smokeless tobacco: Never  Vaping Use   Vaping Use: Never used  Substance and Sexual Activity   Alcohol use: Not Currently    Alcohol/week: 0.0 standard drinks of alcohol   Drug use: Never   Sexual activity: Not Currently    Partners: Male    Birth control/protection: Surgical    Comment: TAH--ovaries remain  Other Topics Concern   Not on file  Social History Narrative   Lives with husband   Caffeine- coffee, 2 cups daily   Right handed   Social Determinants of Health   Financial Resource Strain: Not on file  Food  Insecurity: Not on file  Transportation Needs: Not on file  Physical Activity: Not on file  Stress: Not on file  Social Connections: Not on file  Intimate Partner Violence: Not on file    FAMILY HISTORY: Family History  Problem Relation Age of Onset   Hyperlipidemia Mother    Memory loss Mother        28s; believed to be age-related memory decline   COPD Father        dec age 7/s-smoked   Macular degeneration Father    Behavior problems Father        at the end of life   Diabetes Brother    Macular degeneration Brother    Suicidality Brother    Diabetes Maternal Grandmother    Heart attack Paternal Grandfather     ALLERGIES:  is allergic to ampicillin and penicillins.  MEDICATIONS:  . Current Outpatient Medications:    acyclovir (ZOVIRAX) 400 MG tablet, Take 1 tablet (400 mg total) by mouth 2 (two) times daily., Disp: 60 tablet, Rfl: 11   Ascorbic Acid (VITAMIN C) 1000 MG tablet, Take 1,000 mg by mouth 2 (two) times daily., Disp: , Rfl:    aspirin EC 81 MG tablet, Take 81 mg by mouth daily., Disp: , Rfl:    b complex vitamins  capsule, Take 1 capsule by mouth daily., Disp: , Rfl:    Cholecalciferol (VITAMIN D3) 30 MCG/15ML LIQD, Take by mouth. Pt. Takes 5000 IU daily, Disp: , Rfl:    Cyanocobalamin (B-12) 1000 MCG CAPS, , Disp: , Rfl:    dexamethasone (DECADRON) 4 MG tablet, Take 1 tablet (4 mg total) by mouth daily. Take for 2 days starting the night of chemotherapy., Disp: 20 tablet, Rfl: 4   EPINEPHrine 0.3 mg/0.3 mL IJ SOAJ injection, INJECT INTRAMUSCULARLLY AS DIRECTED AS NEEDED FOR ANAPHYLAXIS, Disp: , Rfl:    estradiol (ESTRACE) 0.1 MG/GM vaginal cream, INSERT ONEGRAM VAGINALLY TWICE A WEEK AT BEDTIME, Disp: 43 g, Rfl: 1   lenalidomide (REVLIMID) 10 MG capsule, TAKE ONE CAPSULE BY MOUTH DAILY FOR 21 DAYS, THEN 7 DAYS OFF, Disp: 21 capsule, Rfl: 0   LORazepam (ATIVAN) 0.5 MG tablet, Take 1 tablet (0.5 mg total) by mouth at bedtime as needed for sleep or anxiety., Disp: 30 tablet, Rfl: 0   Magnesium 400 MG CAPS, Take by mouth., Disp: , Rfl:    ondansetron (ZOFRAN) 8 MG tablet, Take 1 tablet (8 mg total) by mouth every 8 (eight) hours as needed for nausea or vomiting., Disp: 30 tablet, Rfl: 1   prochlorperazine (COMPAZINE) 10 MG tablet, Take 1 tablet (10 mg total) by mouth every 6 (six) hours as needed for nausea or vomiting., Disp: 30 tablet, Rfl: 1   LABORATORY DATA:  I have reviewed the data as listed     Latest Ref Rng & Units 08/25/2022    1:56 PM 08/18/2022    8:51 AM 08/11/2022   11:30 AM  CBC  WBC 4.0 - 10.5 K/uL 5.0  3.5  4.4   Hemoglobin 12.0 - 15.0 g/dL 12.3  12.6  12.6   Hematocrit 36.0 - 46.0 % 36.8  37.4  37.5   Platelets 150 - 400 K/uL 244  276  307       Latest Ref Rng & Units 07/26/2022   11:52 AM 04/21/2022    1:24 PM 01/16/2022    1:50 PM  CMP  Glucose 70 - 99 mg/dL 86  99  86   BUN 8 - 23  mg/dL _0 Creatinine 0.44 - 1.00 mg/dL 0.80  1.24  0.84   Sodium 135 - 145 mmol/L 139  138  141   Potassium 3.5 - 5.1 mmol/L 3.8  4.3  3.5   Chloride 98 - 111 mmol/L 105  104  103   CO2  22 - 32 mmol/L _1 Calcium 8.9 - 10.3 mg/dL 9.6  9.4  9.6   Total Protein 6.5 - 8.1 g/dL 7.8  7.6  7.6   Total Bilirubin 0.3 - 1.2 mg/dL 0.9  1.1  0.9   Alkaline Phos 38 - 126 U/L 75  74  81   AST 15 - 41 U/L _2 ALT 0 - 44 U/L _3 10/10/2019 Flow Cytometry (Repeat BM Bx from [facility]):   10/07/18 Repeat BM Bx from [facility]:   Surgical Pathology                                Case: [id number]                               Authorizing Provider:  Richardson Landry, NP    Collected:           07/04/2022 1015             Ordering Location:     [facility]   Received:            07/04/2022 1048                                    [facility]                                                   Pathologist:           Lelon Frohlich, MD                                                   Specimens:   A) - Bone Marrow, Aspirate                                                                        B) - Bone Marrow, Biopsy  C) - Bone Marrow, Clot                                                                 DIAGNOSIS   A-C. Peripheral blood and bone marrow (peripheral smear, aspirate, touch preparation, core biopsy, and clot section): - Low-level plasma cell neoplasm, see comment.   COMMENT: The core biopsy is 40% cellular. Cyclin-D1 immunohistochemical stain demonstrates very rare positive plasma cells (1%). Concurrent flow cytometry study ([id number]) shows a plasma cell population with an atypical immunophenotype, comprising approximately 0.04% of analyzed events.    Reviewed by resident/fellow Beola Cord  Electronically signed by Lelon Frohlich, MD on 07/10/2022 at 12:25 PM  Clinical Information   67 year old female with history of plasma cell myeloma.  Gross Examination   A. Peripheral blood and bone marrow labeled with patient's name and medical  record number, one peripheral blood smear, three bone marrow aspirate smears and one bone marrow touch preparation are received.   B. "Bone marrow biopsy", in AZF.  Core biopsies of red-brown tissue 1.4 x 0.2 cm in aggregate are submitted in total as block B1 for decalcification.   C. "Bone marrow clot", in AZF.  A 1.8 x 1.8 x 0.3 cm aggregate of red-brown clot is submitted in total as block C1.    J.Elam/ Dr. Linzie Collin   Microscopic Examination        Lab Results  Component Value Date    WBC 5.2 07/04/2022    HGB 12.1 07/04/2022    PLT 364 07/04/2022    MCV 95 07/04/2022    RDWCV 14.6 (H) 07/04/2022    Peripheral blood and automated blood count:  RBCs: The red blood cells are adequate in number,  normocytic and normochromic, with minimal anisopoikilocytosis. WBCs: The white blood cells are adequate in number and show unremarkable morphology. Platelets: The platelets are adequate in number and show unremarkable morphology.   Bone marrow aspirate:  There are adequate cellular particles. M:E ratio is 3:1.  Granulopoiesis: Maturing myeloid elements are present and show complete maturation. Erythropoiesis:  Erythroid elements are present and show complete maturation. Megakaryocytes: Megakaryocytes are adequate in number and show unremarkable morphology.  There are slightly increased plasma cells with unremarkable morphology.   Bone marrow touch preparation: Adequate marrow elements are present, without additional findings to the aspirate smears.   Prussian blue stained aspirate smear: 0 of 4 stainable iron is present. No ringed sideroblasts are identified.   Bone marrow core biopsy:  The core biopsy is adequate for evaluation. The cellularity is 40%. Maturing myeloid elements are present and show complete maturation. Maturing erythroid elements are present and show complete maturation.  Megakaryocytes are adequate in number and show unremarkable morphology. There are slightly increased  plasma cells.   A reticulin stain of the core biopsy shows no significant reticulin fibrosis.   Aspirate clot section: There are adequate particles present, without additional findings to the core biopsy.   Immunohistochemical stains for Cyclin-D1, CD138, Kappa CISH, and Lambda CISH are performed on the core biopsy section (block B1). CD138, kappa, and lambda in-situ hybridization demonstrates 5-10% predominantly polytypic plasma cells. Cyclin-D1 demonstrates very rare positive plasma cells (1%).  Bone Marrow Differential   500 CELL DIFF   Cell Type Cell Count Reference Range  Cell Type Cell Count Reference Range Cell Type Cell Count Reference Range  Segs 23 (7-25) Lymphocytes 15 (3-20) Plasma Cells 8 (0-3.5)  Bands 3 (6-36)     Atypicals 0   RBC Precursors 22 (10-30)  Metas 2 (9-25) Lymphoblasts *   Pronormoblasts 0 (0-3)  Myelos 5 (8-15) Eosinophils 12 (0-4) M:E Ratio 3/1    Promyelos 1 (1-6) Basophils 0 (0-1) Iron 0 (Scale 0-4+)  Myeloblasts 1 (0-3.5) Monocytes 8 (0-2)                                                                                                     Performed by:    Tyrone Sage                                                                                                         July 04, 2022 12:21 PM  Additional Documentation   All immunohistochemistry, in situ hybridization tests and special stains performed at [facility] and reported herein were developed, validated and their performance characteristics determined by the [facility]. During the performance of these tests, appropriate positive and negative control slides are also performed and reviewed. All control slides and internal controls (when applicable) demonstrate the expected immunoreactive patterns and/or nucleic acid hybridization. These ancillary studies were deemed medically necessary by the requesting pathologist. They were ordered following review of the H&E and  clinical history except when part of a liver/kidney protocol or where clinical history (e.g. immunocompromised, critically ill, history of malignancy) clearly indicates. Some of the tests may not be cleared or approved by the U.S. Food and Drug Administration (FDA).  The FDA has determined that such clearance or approval is not necessary.  These tests are used for clinical purposes and should not be regarded as investigational or as research.  This laboratory is certified under the [place] (CLIA) as qualified to perform high complexity clinical testing. At least one block in this case underwent decalcification during its processing. The vast majority of immunohistochemical and hybridization assays were not validated on decalcified tissues. The results were interpreted with caution given the possibility of false negative results on decalcified specimens.  Attestation   All of the diagnostic evaluations on the enumerated specimens have been personally conducted by the pathologists involved in the care of this patient as indicated by the electronic signatures above.  Resulting Agency [facility]   Specimen Collected: 07/04/22 10:15   Performed by: Monroe Last Resulted: 07/10/22 12:25  Received From: [facility]  Result Received: 07/14/22 09:58   View Encounter  Received Information Pathology - Bone Marrow (Order [id number])    suggestion  Information displayed in this report may not trend or trigger automated decision support.    Pathology - Bone Marrow Order: [id number] Component 4 wk ago  Case Report Surgical Pathology                                Case: [id number]                               Authorizing Provider:  Richardson Landry, NP    Collected:           07/04/2022 1015             Ordering Location:     [facility]   Received:             07/04/2022 [place]                                    [facility]                                                   Pathologist:           Lelon Frohlich, MD                                                   Specimens:   A) - Bone Marrow, Aspirate                                                                        B) - Bone Marrow, Biopsy                                                                          C) - Bone Marrow, Clot                                                                DIAGNOSIS   A-C. Peripheral blood and bone marrow (peripheral smear, aspirate, touch preparation, core biopsy, and clot section): - Low-level plasma cell neoplasm, see comment.   COMMENT: The core biopsy is 40% cellular. Cyclin-D1 immunohistochemical stain demonstrates very rare positive plasma cells (1%). Concurrent flow cytometry study ([id number]) shows a  plasma cell population with an atypical immunophenotype, comprising approximately 0.04% of analyzed events.    Reviewed by resident/fellow Beola Cord  Electronically signed by Lelon Frohlich, MD on 07/10/2022 at 12:25 PM  Clinical Information   67 year old female with history of plasma cell myeloma.  Gross Examination   A. Peripheral blood and bone marrow labeled with patient's name and medical record number, one peripheral blood smear, three bone marrow aspirate smears and one bone marrow touch preparation are received.   B. "Bone marrow biopsy", in AZF.  Core biopsies of red-brown tissue 1.4 x 0.2 cm in aggregate are submitted in total as block B1 for decalcification.   C. "Bone marrow clot", in AZF.  A 1.8 x 1.8 x 0.3 cm aggregate of red-brown clot is submitted in total as block C1.    J.Elam/ Dr. Linzie Collin     RADIOGRAPHIC STUDIES: I have personally reviewed the radiological images as listed and agreed with the findings in the report. No results found.  ASSESSMENT & PLAN:   67 y.o. caucasian female  with  1. H/o Multiple myeloma - currently in remission.  But bone marrow biopsy shows she has turned MRD positive  2. IgA lambda R-ISS stage II multiple myeloma with innumerable lytic lesions in the calvarium, lesion in T7 vertebra and multiple lesions in the pelvis.  No significant bone pain at this time. Diagnosed in January 2017.  s/p treatment as noted above 10/07/18 BM Bx and Flow cytometry indicate that the pt continues to be in CR 10/10/2019 Flow Cytometry revealed "A. Bone Marrow (Plasma Cell Myeloma Minimal Residual Disease Detection by Flow Cytometry): Negative. No phenotypically abnormal plasma cells at or above the limit of detection identified."  PLAN:  -Labs done today were discussed in detail with the patient CBC and CMP stable -No new toxicities from her current treatment regimen -Continue subcu daratumumab, Revlimid and dexamethasone with same supportive medications. Continue aspirin 81 mg p.o. daily - continue acyclovir 400 mg p.o. twice daily while on daratumumab.  FOLLOW UP:  Per integrated scheduling  The total time spent in the appointment was 20 minutes*.  All of the patient's questions were answered with apparent satisfaction. The patient knows to call the clinic with any problems, questions or concerns.   Sullivan Lone MD MS AAHIVMS [facility] Hematology/Oncology Physician [facility]  .*Total Encounter Time as defined by the Centers for Medicare and Medicaid Services includes, in addition to the face-to-face time of a patient visit (documented in the note above) non-face-to-face time: obtaining and reviewing outside history, ordering and reviewing medications, tests or procedures, care coordination (communications with other health care professionals or caregivers) and documentation in the medical record.

## 2022-09-29 ENCOUNTER — Ambulatory Visit: Payer: BLUE CROSS/BLUE SHIELD

## 2022-09-29 ENCOUNTER — Other Ambulatory Visit: Payer: BLUE CROSS/BLUE SHIELD

## 2022-10-02 ENCOUNTER — Encounter: Payer: Self-pay | Admitting: Hematology

## 2022-10-05 ENCOUNTER — Telehealth: Payer: Self-pay | Admitting: Pharmacy Technician

## 2022-10-05 ENCOUNTER — Other Ambulatory Visit (HOSPITAL_COMMUNITY): Payer: Self-pay

## 2022-10-05 ENCOUNTER — Other Ambulatory Visit: Payer: Self-pay

## 2022-10-05 DIAGNOSIS — C9001 Multiple myeloma in remission: Secondary | ICD-10-CM

## 2022-10-05 MED ORDER — LENALIDOMIDE 10 MG PO CAPS: ORAL_CAPSULE | ORAL | 0 refills | Status: DC

## 2022-10-05 NOTE — Telephone Encounter (Signed)
Oral Oncology Patient Advocate Encounter  Patient successfully enrolled in grant funding through [employer]. Patient must use this funding before re-enrolling in BMSPAF.  I have spoken with the patient. I will re-open this case at a later time if needed.  Lady Deutscher, CPhT-Adv Oncology Pharmacy Patient [place] Direct Number: [phone]  Fax: [phone]

## 2022-10-05 NOTE — Telephone Encounter (Signed)
Oral Oncology Patient Advocate Encounter  Was successful in securing patient a $12,000 grant from [facility] to provide copayment coverage for Lenalidomide.  This will keep the out of pocket expense at $0.     [employer] ID: [id number]  I have spoken with the patient.   The billing information is as follows and has been shared with [employer].    RxBin: [id number] PCN: [id number] Member ID: [id number] Group ID: [id number] Dates of Eligibility: 09/05/22 through 09/05/23  Fund:  [place], CPhT-Adv Oncology Pharmacy Patient [place] Direct Number: [phone]  Fax: [phone]

## 2022-10-06 ENCOUNTER — Inpatient Hospital Stay: Payer: Medicare Other | Admitting: Hematology

## 2022-10-06 ENCOUNTER — Inpatient Hospital Stay: Payer: Medicare Other | Attending: Hematology

## 2022-10-06 ENCOUNTER — Inpatient Hospital Stay: Payer: Medicare Other

## 2022-10-06 DIAGNOSIS — Z79899 Other long term (current) drug therapy: Secondary | ICD-10-CM | POA: Diagnosis not present

## 2022-10-06 DIAGNOSIS — Z5112 Encounter for antineoplastic immunotherapy: Secondary | ICD-10-CM | POA: Diagnosis present

## 2022-10-06 DIAGNOSIS — C9001 Multiple myeloma in remission: Secondary | ICD-10-CM

## 2022-10-06 LAB — CBC WITH DIFFERENTIAL (CANCER CENTER ONLY)
Abs Immature Granulocytes: 0.01 10*3/uL (ref 0.00–0.07)
Basophils Absolute: 0 10*3/uL (ref 0.0–0.1)
Basophils Relative: 1 %
Eosinophils Absolute: 0 10*3/uL (ref 0.0–0.5)
Eosinophils Relative: 0 %
HCT: 38.5 % (ref 36.0–46.0)
Hemoglobin: 12.9 g/dL (ref 12.0–15.0)
Immature Granulocytes: 0 %
Lymphocytes Relative: 3 %
Lymphs Abs: 0.2 10*3/uL — ABNORMAL LOW (ref 0.7–4.0)
MCH: 32 pg (ref 26.0–34.0)
MCHC: 33.5 g/dL (ref 30.0–36.0)
MCV: 95.5 fL (ref 80.0–100.0)
Monocytes Absolute: 0.1 10*3/uL (ref 0.1–1.0)
Monocytes Relative: 1 %
Neutro Abs: 6.1 10*3/uL (ref 1.7–7.7)
Neutrophils Relative %: 95 %
Platelet Count: 306 10*3/uL (ref 150–400)
RBC: 4.03 MIL/uL (ref 3.87–5.11)
RDW: 14.1 % (ref 11.5–15.5)
WBC Count: 6.4 10*3/uL (ref 4.0–10.5)
nRBC: 0 % (ref 0.0–0.2)

## 2022-10-06 LAB — CMP (CANCER CENTER ONLY)
ALT: 23 U/L (ref 0–44)
AST: 18 U/L (ref 15–41)
Albumin: 4 g/dL (ref 3.5–5.0)
Alkaline Phosphatase: 70 U/L (ref 38–126)
Anion gap: 8 (ref 5–15)
BUN: 19 mg/dL (ref 8–23)
CO2: 24 mmol/L (ref 22–32)
Calcium: 9.1 mg/dL (ref 8.9–10.3)
Chloride: 106 mmol/L (ref 98–111)
Creatinine: 0.73 mg/dL (ref 0.44–1.00)
GFR, Estimated: >60 mL/min (ref >60)
Glucose, Bld: 168 mg/dL — ABNORMAL HIGH (ref 70–99)
Potassium: 3.7 mmol/L (ref 3.5–5.1)
Sodium: 138 mmol/L (ref 135–145)
Total Bilirubin: 0.8 mg/dL (ref 0.3–1.2)
Total Protein: 6.7 g/dL (ref 6.5–8.1)

## 2022-10-06 MED ORDER — ACETAMINOPHEN 325 MG PO TABS
650.0000 mg | ORAL_TABLET | Freq: Once | ORAL | Status: AC
  Administered 2022-10-06: 650 mg via ORAL
  Filled 2022-10-06: qty 2

## 2022-10-06 MED ORDER — DARATUMUMAB-HYALURONIDASE-FIHJ 1800-30000 MG-UT/15ML ~~LOC~~ SOLN
1800.0000 mg | Freq: Once | SUBCUTANEOUS | Status: AC
  Administered 2022-10-06: 1800 mg via SUBCUTANEOUS
  Filled 2022-10-06: qty 15

## 2022-10-06 MED ORDER — FAMOTIDINE 20 MG PO TABS
20.0000 mg | ORAL_TABLET | Freq: Once | ORAL | Status: AC
  Administered 2022-10-06: 20 mg via ORAL
  Filled 2022-10-06: qty 1

## 2022-10-06 MED ORDER — DIPHENHYDRAMINE HCL 25 MG PO CAPS
25.0000 mg | ORAL_CAPSULE | Freq: Once | ORAL | Status: AC
  Administered 2022-10-06: 25 mg via ORAL
  Filled 2022-10-06: qty 1

## 2022-10-06 MED ORDER — DEXAMETHASONE 4 MG PO TABS: 20.0000 mg | ORAL_TABLET | Freq: Once | ORAL | Status: DC

## 2022-10-06 NOTE — Progress Notes (Signed)
HEMATOLOGY/ONCOLOGY CLINIC NOTE  Date of Service: 10/06/22   PCP: [name], MD Transplant Oncologist -- Dr Jeanann Lewandowsky MD  CHIEF COMPLAINT: Follow-up for continued evaluation and management of multiple myeloma  DIAGNOSIS: IgA lambda R-ISS stage II multiple myeloma with innumerable lytic lesions in the calvarium, lesion in T7 vertebra and multiple lesions in the pelvis.  Diagnosed in January 2017.   Current treatment: Revlimid '10mg'$  po daily 3 weeks on 1 week off (after treatment interruption) Daratumumab added 11/15 for loss of MRD negative status   Previous Treatment: Status post Vd x 1 cycle VRd x 6 cycles   HD Melphalan '200mg'$ /m2 on 03/22/2016 Autologous HSCT on 03/23/2016 with Dr Samule Ohm at [facility] (Dose of 6.4 x10^6/kg)   INTERVAL HISTORY:   Ms. Buschman is a 67 y.o. female is here for continued evaluation and management of multiple myeloma and  Daratumumab cycle 3 day 1.   Patient was last seen by me on 09/22/2022 and was doing well overall with no new medical concerns.  Today, she is tolerating her treatment well. She reports that she received an insulin shot on New Years day.   She reports slightly more frequent diarrhea. She does not believe this is related to her treatment.  She takes her Lorazepam 0.5 mg at nightime as needed. She is tolerating all of her medications.  No skin rashes, fevers, or sleep disturbances besides days on Lorazipam  No infection issues. She is UTD on her vaccinations. No problems tolerating past influenza vaccination. No abdominal pain.   REVIEW OF SYSTEMS:   10 Point review of Systems was done is negative except as noted above.   PHYSICAL EXAMINATION: .BP 115/65   Pulse 95   Temp 97.9 F (36.6 C)   Resp 18   Wt 130 lb 12.8 oz (59.3 kg)   LMP 09/26/1995 (Within Months)   SpO2 98%   BMI 20.18 kg/m   GENERAL:alert, in no acute distress and comfortable SKIN: no acute rashes, no significant lesions EYES: conjunctiva  are pink and non-injected, sclera anicteric OROPHARYNX: MMM, no exudates, no oropharyngeal erythema or ulceration NECK: supple, no JVD LYMPH:  no palpable lymphadenopathy in the cervical, axillary or inguinal regions LUNGS: clear to auscultation b/l with normal respiratory effort HEART: regular rate & rhythm ABDOMEN:  normoactive bowel sounds , non tender, not distended. Extremity: no pedal edema PSYCH: alert & oriented x 3 with fluent speech NEURO: no focal motor/sensory deficits   MEDICAL HISTORY:   Past Medical History:  Diagnosis Date   Abdominal pain    Abnormal Pap smear of vagina 03/22/2017   ASCUS pap and negative HR HPV.  Patient is on immunosuppressive medication.    Allergic reaction to bee sting    Anemia    AR (aortic regurgitation) 11/30/2017   trace noted on ECHO   Atrial septal aneurysm per 11-30-17 echo   trivial pericardial effusion   Complicated migraine    Constipation    DDD (degenerative disc disease), cervical    DDD (degenerative disc disease), lumbosacral    Fatty liver    Female proctocele without uterine prolapse    Fibroid    reason for Hysterectomy   FTT (failure to thrive) in adult 08/09/2017   Gastroesophageal reflux disease without esophagitis 01/17/2017   Generalized anxiety disorder    Grade I diastolic dysfunction    Hammer toe 05/13/2018   Hematuria 11/08/2015   History of COVID-19 12/2020   History of stress incontinence    Hypergammaglobulinemia  Hyperlipidemia    Hypoxemia 12/16/2007   Insomnia    Leg length discrepancy    Loss of appetite    Lung nodule    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   Metastatic multiple myeloma to bone 10/19/2015   Metatarsalgia of right foot 07/15/2018   MR (mitral regurgitation) 11/30/2017   trace noted on ECHO   Muscular fasciculation    Nasal septal deviation    Nasal turbinate hypertrophy    Near syncope    Obstructive sleep apnea 12/03/2007   Other fatigue    Peripheral edema     Plasma cell dyscrasia    PONV (postoperative nausea and vomiting)    thinks morphine caused nausea   Postoperative urinary retention 05/14/2018   Prediabetes    Pure hypercholesterolemia    Rash 11/01/2015   Restless leg syndrome    S/P autologous bone marrow transplantation 03/29/2016   Shortness of breath 12/04/2007   TR (tricuspid regurgitation) 11/30/2017   trace noted on ECHO   Urinary incontinence    UTI (urinary tract infection)    Vaginal granulation tissue 12/03/2017   Vitamin D deficiency    Wears glasses     SURGICAL HISTORY: Past Surgical History:  Procedure Laterality Date   ABDOMINAL HYSTERECTOMY  1997   TAH--ovaries remain--Dr. Newton Pigg   ANTERIOR AND POSTERIOR REPAIR N/A 05/14/2018   Procedure: ANTERIOR (CYSTOCELE) AND POSTERIOR REPAIR (RECTOCELE);  Surgeon: [name], MD;  Location: [facility];  Service: Gynecology;  Laterality: N/A;   BLADDER SUSPENSION N/A 05/14/2018   Procedure: TRANSVAGINAL TAPE (TVT) PROCEDURE exact midurethral sling;  Surgeon: [name], MD;  Location: [facility];  Service: Gynecology;  Laterality: N/A;   BONE MARROW TRANSPLANT  2017   done at [place] N/A 05/14/2018   Procedure: CYSTOSCOPY;  Surgeon: [name], MD;  Location: [facility];  Service: Gynecology;  Laterality: N/A;   CYSTOSCOPY N/A 05/30/2018   Procedure: CYSTOSCOPY;  Surgeon: [name], MD;  Location: [facility];  Service: Gynecology;  Laterality: N/A;   ROBOTIC ASSISTED LAPAROSCOPIC SACROCOLPOPEXY N/A 05/14/2018   Procedure: XI ROBOTIC ASSISTED LAPAROSCOPIC SACROCOLPOPEXY;  Surgeon: [name], MD;  Location: [facility];  Service: Gynecology;  Laterality: N/A;  6 hours OR time. Need extended stay recovery bed.   SALPINGOOPHORECTOMY Bilateral 05/14/2018   Procedure: SALPINGO OOPHORECTOMY;  Surgeon: [name], MD;  Location: [facility];  Service:  Gynecology;  Laterality: Bilateral;   THYMECTOMY  1999   TRANSVAGINAL TAPE (TVT) REMOVAL N/A 05/30/2018   Procedure: TRANSVAGINAL TAPE (TVT)  Revision;  Surgeon: [name], MD;  Location: [facility];  Service: Gynecology;  Laterality: N/A;   WISDOM TOOTH EXTRACTION      SOCIAL HISTORY: Social History   Socioeconomic History   Marital status: Married    Spouse name: [name]   Number of children: 2   Years of education: 16   Highest education level: Bachelor's degree (e.g., BA, AB, BS)  Occupational History   Occupation: Retired    Comment: Merchandiser, retail [employer]  Tobacco Use   Smoking status: Never   Smokeless tobacco: Never  Vaping Use   Vaping Use: Never used  Substance and Sexual Activity   Alcohol use: Not Currently    Alcohol/week: 0.0 standard drinks of alcohol   Drug use: Never  Sexual activity: Not Currently    Partners: Male    Birth control/protection: Surgical    Comment: TAH--ovaries remain  Other Topics Concern   Not on file  Social History Narrative   Lives with husband   Caffeine- coffee, 2 cups daily   Right handed   Social Determinants of Health   Financial Resource Strain: Not on file  Food Insecurity: Not on file  Transportation Needs: Not on file  Physical Activity: Not on file  Stress: Not on file  Social Connections: Not on file  Intimate Partner Violence: Not on file    FAMILY HISTORY: Family History  Problem Relation Age of Onset   Hyperlipidemia Mother    Memory loss Mother        34s; believed to be age-related memory decline   COPD Father        dec age 69/s-smoked   Macular degeneration Father    Behavior problems Father        at the end of life   Diabetes Brother    Macular degeneration Brother    Suicidality Brother    Diabetes Maternal Grandmother    Heart attack Paternal Grandfather     ALLERGIES:  is allergic to ampicillin and penicillins.  MEDICATIONS:  . Current Outpatient  Medications:    acyclovir (ZOVIRAX) 400 MG tablet, Take 1 tablet (400 mg total) by mouth 2 (two) times daily., Disp: 60 tablet, Rfl: 11   Ascorbic Acid (VITAMIN C) 1000 MG tablet, Take 1,000 mg by mouth 2 (two) times daily., Disp: , Rfl:    aspirin EC 81 MG tablet, Take 81 mg by mouth daily., Disp: , Rfl:    b complex vitamins capsule, Take 1 capsule by mouth daily., Disp: , Rfl:    Cholecalciferol (VITAMIN D3) 30 MCG/15ML LIQD, Take by mouth. Pt. Takes 5000 IU daily, Disp: , Rfl:    Cyanocobalamin (B-12) 1000 MCG CAPS, , Disp: , Rfl:    dexamethasone (DECADRON) 4 MG tablet, Take 1 tablet (4 mg total) by mouth daily. Take for 2 days starting the night of chemotherapy., Disp: 20 tablet, Rfl: 4   EPINEPHrine 0.3 mg/0.3 mL IJ SOAJ injection, INJECT INTRAMUSCULARLLY AS DIRECTED AS NEEDED FOR ANAPHYLAXIS, Disp: , Rfl:    estradiol (ESTRACE) 0.1 MG/GM vaginal cream, INSERT ONEGRAM VAGINALLY TWICE A WEEK AT BEDTIME, Disp: 43 g, Rfl: 1   lenalidomide (REVLIMID) 10 MG capsule, TAKE ONE CAPSULE BY MOUTH DAILY FOR 21 DAYS, THEN 7 DAYS OFF, Disp: 21 capsule, Rfl: 0   LORazepam (ATIVAN) 0.5 MG tablet, Take 1 tablet (0.5 mg total) by mouth at bedtime as needed for sleep or anxiety., Disp: 30 tablet, Rfl: 0   Magnesium 400 MG CAPS, Take by mouth., Disp: , Rfl:    ondansetron (ZOFRAN) 8 MG tablet, Take 1 tablet (8 mg total) by mouth every 8 (eight) hours as needed for nausea or vomiting., Disp: 30 tablet, Rfl: 1   prochlorperazine (COMPAZINE) 10 MG tablet, Take 1 tablet (10 mg total) by mouth every 6 (six) hours as needed for nausea or vomiting., Disp: 30 tablet, Rfl: 1   LABORATORY DATA:  I have reviewed the data as listed     Latest Ref Rng & Units 10/06/2022   10:52 AM 09/22/2022    9:21 AM 09/08/2022    1:40 PM  CBC  WBC 4.0 - 10.5 K/uL 6.4  4.8  4.7   Hemoglobin 12.0 - 15.0 g/dL 12.9  12.8  12.7   Hematocrit 36.0 -  46.0 % 38.5  37.4  37.6   Platelets 150 - 400 K/uL 306  270  297       Latest Ref  Rng & Units 10/06/2022   10:52 AM 09/22/2022    9:25 AM 09/08/2022    1:40 PM  CMP  Glucose 70 - 99 mg/dL 168  92  217   BUN 8 - 23 mg/dL '19  20  19   '$ Creatinine 0.44 - 1.00 mg/dL 0.73  0.73  0.81   Sodium 135 - 145 mmol/L 138  138  139   Potassium 3.5 - 5.1 mmol/L 3.7  4.1  3.9   Chloride 98 - 111 mmol/L 106  104  104   CO2 22 - 32 mmol/L '24  29  25   '$ Calcium 8.9 - 10.3 mg/dL 9.1  9.7  9.7   Total Protein 6.5 - 8.1 g/dL 6.7  6.6  6.5   Total Bilirubin 0.3 - 1.2 mg/dL 0.8  0.6  0.6   Alkaline Phos 38 - 126 U/L 70  60  74   AST 15 - 41 U/L '18  17  18   '$ ALT 0 - 44 U/L '23  21  28      '$ 10/10/2019 Flow Cytometry (Repeat BM Bx from [facility]):   10/07/18 Repeat BM Bx from [facility]:   Surgical Pathology                                Case: [id number]                               Authorizing Provider:  Richardson Landry, NP    Collected:           07/04/2022 1015             Ordering Location:     [facility]   Received:            07/04/2022 [place]                                    [facility]                                                   Pathologist:           Lelon Frohlich, MD                                                   Specimens:   A) - Bone Marrow, Aspirate                                                                        B) - Bone Marrow, Biopsy  C) - Bone Marrow, Clot                                                                 DIAGNOSIS   A-C. Peripheral blood and bone marrow (peripheral smear, aspirate, touch preparation, core biopsy, and clot section): - Low-level plasma cell neoplasm, see comment.   COMMENT: The core biopsy is 40% cellular. Cyclin-D1 immunohistochemical stain demonstrates very rare positive plasma cells (1%). Concurrent flow cytometry study ([id number]) shows a plasma cell population with an atypical immunophenotype, comprising approximately  0.04% of analyzed events.    Reviewed by resident/fellow Beola Cord  Electronically signed by Lelon Frohlich, MD on 07/10/2022 at 12:25 PM  Clinical Information   67 year old female with history of plasma cell myeloma.  Gross Examination   A. Peripheral blood and bone marrow labeled with patient's name and medical record number, one peripheral blood smear, three bone marrow aspirate smears and one bone marrow touch preparation are received.   B. "Bone marrow biopsy", in AZF.  Core biopsies of red-brown tissue 1.4 x 0.2 cm in aggregate are submitted in total as block B1 for decalcification.   C. "Bone marrow clot", in AZF.  A 1.8 x 1.8 x 0.3 cm aggregate of red-brown clot is submitted in total as block C1.    J.Elam/ Dr. Linzie Collin   Microscopic Examination        Lab Results  Component Value Date    WBC 5.2 07/04/2022    HGB 12.1 07/04/2022    PLT 364 07/04/2022    MCV 95 07/04/2022    RDWCV 14.6 (H) 07/04/2022    Peripheral blood and automated blood count:  RBCs: The red blood cells are adequate in number,  normocytic and normochromic, with minimal anisopoikilocytosis. WBCs: The white blood cells are adequate in number and show unremarkable morphology. Platelets: The platelets are adequate in number and show unremarkable morphology.   Bone marrow aspirate:  There are adequate cellular particles. M:E ratio is 3:1.  Granulopoiesis: Maturing myeloid elements are present and show complete maturation. Erythropoiesis:  Erythroid elements are present and show complete maturation. Megakaryocytes: Megakaryocytes are adequate in number and show unremarkable morphology.  There are slightly increased plasma cells with unremarkable morphology.   Bone marrow touch preparation: Adequate marrow elements are present, without additional findings to the aspirate smears.   Prussian blue stained aspirate smear: 0 of 4 stainable iron is present. No ringed sideroblasts are identified.    Bone marrow core biopsy:  The core biopsy is adequate for evaluation. The cellularity is 40%. Maturing myeloid elements are present and show complete maturation. Maturing erythroid elements are present and show complete maturation.  Megakaryocytes are adequate in number and show unremarkable morphology. There are slightly increased plasma cells.   A reticulin stain of the core biopsy shows no significant reticulin fibrosis.   Aspirate clot section: There are adequate particles present, without additional findings to the core biopsy.   Immunohistochemical stains for Cyclin-D1, CD138, Kappa CISH, and Lambda CISH are performed on the core biopsy section (block B1). CD138, kappa, and lambda in-situ hybridization demonstrates 5-10% predominantly polytypic plasma cells. Cyclin-D1 demonstrates very rare positive plasma cells (1%).  Bone Marrow Differential   500 CELL DIFF   Cell Type Cell Count Reference Range  Cell Type Cell Count Reference Range Cell Type Cell Count Reference Range  Segs 23 (7-25) Lymphocytes 15 (3-20) Plasma Cells 8 (0-3.5)  Bands 3 (6-36)     Atypicals 0   RBC Precursors 22 (10-30)  Metas 2 (9-25) Lymphoblasts *   Pronormoblasts 0 (0-3)  Myelos 5 (8-15) Eosinophils 12 (0-4) M:E Ratio 3/1    Promyelos 1 (1-6) Basophils 0 (0-1) Iron 0 (Scale 0-4+)  Myeloblasts 1 (0-3.5) Monocytes 8 (0-2)                                                                                                     Performed by:    Tyrone Sage                                                                                                         July 04, 2022 12:21 PM  Additional Documentation   All immunohistochemistry, in situ hybridization tests and special stains performed at [facility] and reported herein were developed, validated and their performance characteristics determined by the [facility]. During the performance of these tests, appropriate positive  and negative control slides are also performed and reviewed. All control slides and internal controls (when applicable) demonstrate the expected immunoreactive patterns and/or nucleic acid hybridization. These ancillary studies were deemed medically necessary by the requesting pathologist. They were ordered following review of the H&E and clinical history except when part of a liver/kidney protocol or where clinical history (e.g. immunocompromised, critically ill, history of malignancy) clearly indicates. Some of the tests may not be cleared or approved by the U.S. Food and Drug Administration (FDA).  The FDA has determined that such clearance or approval is not necessary.  These tests are used for clinical purposes and should not be regarded as investigational or as research.  This laboratory is certified under the [place] (CLIA) as qualified to perform high complexity clinical testing. At least one block in this case underwent decalcification during its processing. The vast majority of immunohistochemical and hybridization assays were not validated on decalcified tissues. The results were interpreted with caution given the possibility of false negative results on decalcified specimens.  Attestation   All of the diagnostic evaluations on the enumerated specimens have been personally conducted by the pathologists involved in the care of this patient as indicated by the electronic signatures above.  Resulting Agency [place] [facility]   Specimen Collected: 07/04/22 10:15   Performed by: [place] Last Resulted: 07/10/22 12:25  Received From: [place]  Result Received: 07/14/22 09:58   View Encounter  Received Information Pathology - Bone Marrow (Order [id number])    suggestion  Information displayed in this report may not trend or trigger automated decision support.    Pathology -  Bone Marrow Order: [id number] Component 4 wk ago  Case Report Surgical Pathology                                Case: [id number]                               Authorizing Provider:  Richardson Landry, NP    Collected:           07/04/2022 1015             Ordering Location:     [facility]   Received:            07/04/2022 [place]                                    [facility]                                                   Pathologist:           Lelon Frohlich, MD                                                   Specimens:   A) - Bone Marrow, Aspirate                                                                        B) - Bone Marrow, Biopsy                                                                          C) - Bone Marrow, Clot                                                                DIAGNOSIS   A-C. Peripheral blood and bone marrow (peripheral smear, aspirate, touch preparation, core biopsy, and clot section): - Low-level plasma cell neoplasm, see comment.   COMMENT: The core biopsy is 40% cellular. Cyclin-D1 immunohistochemical stain demonstrates very rare positive plasma cells (1%). Concurrent flow cytometry study ([id number]) shows a  plasma cell population with an atypical immunophenotype, comprising approximately 0.04% of analyzed events.    Reviewed by resident/fellow Beola Cord  Electronically signed by Lelon Frohlich, MD on 07/10/2022 at 12:25 PM  Clinical Information   67 year old female with history of plasma cell myeloma.  Gross Examination   A. Peripheral blood and bone marrow labeled with patient's name and medical record number, one peripheral blood smear, three bone marrow aspirate smears and one bone marrow touch preparation are received.   B. "Bone marrow biopsy", in AZF.  Core biopsies of red-brown tissue 1.4 x 0.2 cm in aggregate are submitted in total as block B1 for decalcification.   C. "Bone marrow  clot", in AZF.  A 1.8 x 1.8 x 0.3 cm aggregate of red-brown clot is submitted in total as block C1.    J.Elam/ Dr. Linzie Collin     RADIOGRAPHIC STUDIES: I have personally reviewed the radiological images as listed and agreed with the findings in the report. No results found.  ASSESSMENT & PLAN:   67 y.o. caucasian female with  1. H/o Multiple myeloma - currently in remission.  But bone marrow biopsy shows she has turned MRD positive  2. IgA lambda R-ISS stage II multiple myeloma with innumerable lytic lesions in the calvarium, lesion in T7 vertebra and multiple lesions in the pelvis.  No significant bone pain at this time. Diagnosed in January 2017.  s/p treatment as noted above 10/07/18 BM Bx and Flow cytometry indicate that the pt continues to be in CR 10/10/2019 Flow Cytometry revealed "A. Bone Marrow (Plasma Cell Myeloma Minimal Residual Disease Detection by Flow Cytometry): Negative. No phenotypically abnormal plasma cells at or above the limit of detection identified."  PLAN:  -Discussed lab results on 10/06/22 with patient. CBC normal. WBC of 6.4 K, Hemoglobin of 12.9 K, and platelets of 306 K.  CMP stable SPEP -shows stable M spike of 0.2 g/dL of IgG kappa monoclonal protein likely due to the daratumumab.  Her myeloma is IgA lambda. -No new toxicities from her current treatment with daratumumab every 2 weeks and maintenance Revlimid. -daratumumab orders reviewed and signed. -Continue acyclovir for VZV prophylaxis -Continue aspirin for VTE prophylaxis. -Patient has follow-up with Dr. Verlin Dike at [facility] in April 2024 for repeat bone marrow biopsy and MRD testing.  FOLLOW UP: Per integrated scheduling  The total time spent in the appointment was 30 minutes* .  All of the patient's questions were answered with apparent satisfaction. The patient knows to call the clinic with any problems, questions or concerns.  Sullivan Lone MD MS AAHIVMS [facility] Hematology/Oncology Physician [facility]  .*Total Encounter Time as defined by the Centers for Medicare and Medicaid Services includes, in addition to the face-to-face time of a patient visit (documented in the note above) non-face-to-face time: obtaining and reviewing outside history, ordering and reviewing medications, tests or procedures, care coordination (communications with other health care professionals or caregivers) and documentation in the medical record.   I,Mitra Faeizi,acting as a Education administrator for Sullivan Lone, MD.,have documented all relevant documentation on the behalf of Sullivan Lone, MD,as directed by  Sullivan Lone, MD while in the presence of Sullivan Lone, MD.  .I have reviewed the above documentation for accuracy and completeness, and I agree with the above. Brunetta Genera MD

## 2022-10-06 NOTE — Progress Notes (Signed)
Patient seen by MD today  Vitals are within treatment parameters.  Labs reviewed: and are within treatment parameters."  Per physician team, patient is ready for treatment and there are NO modifications to the treatment plan.

## 2022-10-11 ENCOUNTER — Other Ambulatory Visit: Payer: Self-pay

## 2022-10-11 LAB — MULTIPLE MYELOMA PANEL, SERUM
Albumin SerPl Elph-Mcnc: 3.8 g/dL (ref 2.9–4.4)
Albumin/Glob SerPl: 1.6 (ref 0.7–1.7)
Alpha 1: 0.2 g/dL (ref 0.0–0.4)
Alpha2 Glob SerPl Elph-Mcnc: 0.7 g/dL (ref 0.4–1.0)
B-Globulin SerPl Elph-Mcnc: 1 g/dL (ref 0.7–1.3)
Gamma Glob SerPl Elph-Mcnc: 0.5 g/dL (ref 0.4–1.8)
Globulin, Total: 2.5 g/dL (ref 2.2–3.9)
IgA: 133 mg/dL (ref 87–352)
IgG (Immunoglobin G), Serum: 637 mg/dL (ref 586–1602)
IgM (Immunoglobulin M), Srm: 24 mg/dL — ABNORMAL LOW (ref 26–217)
M Protein SerPl Elph-Mcnc: 0.2 g/dL — ABNORMAL HIGH
Total Protein ELP: 6.3 g/dL (ref 6.0–8.5)

## 2022-10-12 ENCOUNTER — Encounter: Payer: Self-pay | Admitting: Hematology

## 2022-10-13 ENCOUNTER — Other Ambulatory Visit: Payer: Self-pay

## 2022-10-18 ENCOUNTER — Other Ambulatory Visit: Payer: Self-pay

## 2022-10-18 ENCOUNTER — Inpatient Hospital Stay: Payer: Medicare Other

## 2022-10-18 VITALS — BP 111/62 | HR 86 | Temp 98.1°F | Resp 18 | Ht 67.5 in | Wt 130.8 lb

## 2022-10-18 DIAGNOSIS — C9001 Multiple myeloma in remission: Secondary | ICD-10-CM | POA: Diagnosis not present

## 2022-10-18 LAB — CBC WITH DIFFERENTIAL (CANCER CENTER ONLY)
Abs Immature Granulocytes: 0.03 10*3/uL (ref 0.00–0.07)
Basophils Absolute: 0 10*3/uL (ref 0.0–0.1)
Basophils Relative: 0 %
Eosinophils Absolute: 0 10*3/uL (ref 0.0–0.5)
Eosinophils Relative: 0 %
HCT: 37.6 % (ref 36.0–46.0)
Hemoglobin: 12.8 g/dL (ref 12.0–15.0)
Immature Granulocytes: 0 %
Lymphocytes Relative: 2 %
Lymphs Abs: 0.2 10*3/uL — ABNORMAL LOW (ref 0.7–4.0)
MCH: 32.1 pg (ref 26.0–34.0)
MCHC: 34 g/dL (ref 30.0–36.0)
MCV: 94.2 fL (ref 80.0–100.0)
Monocytes Absolute: 0.1 10*3/uL (ref 0.1–1.0)
Monocytes Relative: 1 %
Neutro Abs: 7.1 10*3/uL (ref 1.7–7.7)
Neutrophils Relative %: 97 %
Platelet Count: 285 10*3/uL (ref 150–400)
RBC: 3.99 MIL/uL (ref 3.87–5.11)
RDW: 13.9 % (ref 11.5–15.5)
WBC Count: 7.3 10*3/uL (ref 4.0–10.5)
nRBC: 0 % (ref 0.0–0.2)

## 2022-10-18 LAB — CMP (CANCER CENTER ONLY)
ALT: 19 U/L (ref 0–44)
AST: 18 U/L (ref 15–41)
Albumin: 4 g/dL (ref 3.5–5.0)
Alkaline Phosphatase: 66 U/L (ref 38–126)
Anion gap: 7 (ref 5–15)
BUN: 20 mg/dL (ref 8–23)
CO2: 25 mmol/L (ref 22–32)
Calcium: 9.2 mg/dL (ref 8.9–10.3)
Chloride: 105 mmol/L (ref 98–111)
Creatinine: 0.92 mg/dL (ref 0.44–1.00)
GFR, Estimated: >60 mL/min (ref >60)
Glucose, Bld: 147 mg/dL — ABNORMAL HIGH (ref 70–99)
Potassium: 4.2 mmol/L (ref 3.5–5.1)
Sodium: 137 mmol/L (ref 135–145)
Total Bilirubin: 0.9 mg/dL (ref 0.3–1.2)
Total Protein: 6.8 g/dL (ref 6.5–8.1)

## 2022-10-18 MED ORDER — FAMOTIDINE 20 MG PO TABS
20.0000 mg | ORAL_TABLET | Freq: Once | ORAL | Status: AC
  Administered 2022-10-18: 20 mg via ORAL
  Filled 2022-10-18: qty 1

## 2022-10-18 MED ORDER — ACETAMINOPHEN 325 MG PO TABS
650.0000 mg | ORAL_TABLET | Freq: Once | ORAL | Status: AC
  Administered 2022-10-18: 650 mg via ORAL
  Filled 2022-10-18: qty 2

## 2022-10-18 MED ORDER — DIPHENHYDRAMINE HCL 25 MG PO CAPS
25.0000 mg | ORAL_CAPSULE | Freq: Once | ORAL | Status: AC
  Administered 2022-10-18: 25 mg via ORAL
  Filled 2022-10-18: qty 1

## 2022-10-18 MED ORDER — DARATUMUMAB-HYALURONIDASE-FIHJ 1800-30000 MG-UT/15ML ~~LOC~~ SOLN
1800.0000 mg | Freq: Once | SUBCUTANEOUS | Status: AC
  Administered 2022-10-18: 1800 mg via SUBCUTANEOUS
  Filled 2022-10-18: qty 15

## 2022-10-18 NOTE — Patient Instructions (Signed)
[facility]  Discharge Instructions: Thank you for choosing [facility] to provide your oncology and hematology care.   If you have a lab appointment with the Cancer Center, please go directly to the Cancer Center and check in at the registration area.   Wear comfortable clothing and clothing appropriate for easy access to any Portacath or PICC line.   We strive to give you quality time with your provider. You may need to reschedule your appointment if you arrive late (15 or more minutes).  Arriving late affects you and other patients whose appointments are after yours.  Also, if you miss three or more appointments without notifying the office, you may be dismissed from the clinic at the provider's discretion.      For prescription refill requests, have your pharmacy contact our office and allow 72 hours for refills to be completed.    Today you received the following chemotherapy and/or immunotherapy agents : Darzalex Faspro      To help prevent nausea and vomiting after your treatment, we encourage you to take your nausea medication as directed.  BELOW ARE SYMPTOMS THAT SHOULD BE REPORTED IMMEDIATELY: *FEVER GREATER THAN 100.4 F (38 C) OR HIGHER *CHILLS OR SWEATING *NAUSEA AND VOMITING THAT IS NOT CONTROLLED WITH YOUR NAUSEA MEDICATION *UNUSUAL SHORTNESS OF BREATH *UNUSUAL BRUISING OR BLEEDING *URINARY PROBLEMS (pain or burning when urinating, or frequent urination) *BOWEL PROBLEMS (unusual diarrhea, constipation, pain near the anus) TENDERNESS IN MOUTH AND THROAT WITH OR WITHOUT PRESENCE OF ULCERS (sore throat, sores in mouth, or a toothache) UNUSUAL RASH, SWELLING OR PAIN  UNUSUAL VAGINAL DISCHARGE OR ITCHING   Items with * indicate a potential emergency and should be followed up as soon as possible or go to the Emergency Department if any problems should occur.  Please show the CHEMOTHERAPY ALERT CARD or IMMUNOTHERAPY ALERT CARD  at check-in to the Emergency Department and triage nurse.  Should you have questions after your visit or need to cancel or reschedule your appointment, please contact [facility]  Dept: [phone]  and follow the prompts.  Office hours are 8:00 a.m. to 4:30 p.m. Monday - Friday. Please note that voicemails left after 4:00 p.m. may not be returned until the following business day.  We are closed weekends and major holidays. You have access to a nurse at all times for urgent questions. Please call the main number to the clinic Dept: [phone] and follow the prompts.   For any non-urgent questions, you may also contact your provider using MyChart. We now offer e-Visits for anyone 18 and older to request care online for non-urgent symptoms. For details visit [email or web address].   Also download the MyChart app! Go to the app store, search "MyChart", open the app, select [place] [facility], and log in with your MyChart username and password.  Masks are optional in the cancer centers. If you would like for your care team to wear a mask while they are taking care of you, please let them know. You may have one support person who is at least 69 years old accompany you for your appointments. 

## 2022-10-18 NOTE — Progress Notes (Signed)
Patient dose dexamethasone prior to appointment.

## 2022-10-25 NOTE — Progress Notes (Signed)
The patient has been taking dexamethasone at home prior to daratumumab injections. After discussion with the provider, the offset time of daratumumab was changed to 30 minutes, starting 11/01/2022, in order to reduce patient wait time. A calendar was not sent to the patient because she has already been taking dexamethasone at home.  Laray Anger, PharmD PGY-2 Pharmacy Resident Hematology/Oncology [phone]  10/25/2022 11:29 AM

## 2022-10-31 ENCOUNTER — Other Ambulatory Visit: Payer: Self-pay

## 2022-10-31 DIAGNOSIS — C9001 Multiple myeloma in remission: Secondary | ICD-10-CM

## 2022-10-31 MED ORDER — LENALIDOMIDE 10 MG PO CAPS: ORAL_CAPSULE | ORAL | 0 refills | Status: DC

## 2022-11-01 ENCOUNTER — Inpatient Hospital Stay: Payer: Medicare Other | Attending: Hematology

## 2022-11-01 ENCOUNTER — Ambulatory Visit: Payer: BLUE CROSS/BLUE SHIELD

## 2022-11-01 ENCOUNTER — Inpatient Hospital Stay: Payer: Medicare Other

## 2022-11-01 ENCOUNTER — Inpatient Hospital Stay (HOSPITAL_BASED_OUTPATIENT_CLINIC_OR_DEPARTMENT_OTHER): Payer: Medicare Other | Admitting: Physician Assistant

## 2022-11-01 ENCOUNTER — Other Ambulatory Visit: Payer: BLUE CROSS/BLUE SHIELD

## 2022-11-01 DIAGNOSIS — Z5112 Encounter for antineoplastic immunotherapy: Secondary | ICD-10-CM | POA: Diagnosis present

## 2022-11-01 DIAGNOSIS — C9001 Multiple myeloma in remission: Secondary | ICD-10-CM

## 2022-11-01 DIAGNOSIS — R739 Hyperglycemia, unspecified: Secondary | ICD-10-CM | POA: Insufficient documentation

## 2022-11-01 DIAGNOSIS — Z7952 Long term (current) use of systemic steroids: Secondary | ICD-10-CM | POA: Insufficient documentation

## 2022-11-01 DIAGNOSIS — Z79899 Other long term (current) drug therapy: Secondary | ICD-10-CM | POA: Insufficient documentation

## 2022-11-01 LAB — CBC WITH DIFFERENTIAL (CANCER CENTER ONLY)
Abs Immature Granulocytes: 0.02 10*3/uL (ref 0.00–0.07)
Basophils Absolute: 0 10*3/uL (ref 0.0–0.1)
Basophils Relative: 0 %
Eosinophils Absolute: 0 10*3/uL (ref 0.0–0.5)
Eosinophils Relative: 0 %
HCT: 39.8 % (ref 36.0–46.0)
Hemoglobin: 13.3 g/dL (ref 12.0–15.0)
Immature Granulocytes: 0 %
Lymphocytes Relative: 4 %
Lymphs Abs: 0.2 10*3/uL — ABNORMAL LOW (ref 0.7–4.0)
MCH: 31.7 pg (ref 26.0–34.0)
MCHC: 33.4 g/dL (ref 30.0–36.0)
MCV: 94.8 fL (ref 80.0–100.0)
Monocytes Absolute: 0.1 10*3/uL (ref 0.1–1.0)
Monocytes Relative: 1 %
Neutro Abs: 4.6 10*3/uL (ref 1.7–7.7)
Neutrophils Relative %: 95 %
Platelet Count: 280 10*3/uL (ref 150–400)
RBC: 4.2 MIL/uL (ref 3.87–5.11)
RDW: 14.2 % (ref 11.5–15.5)
WBC Count: 4.9 10*3/uL (ref 4.0–10.5)
nRBC: 0 % (ref 0.0–0.2)

## 2022-11-01 LAB — CMP (CANCER CENTER ONLY)
ALT: 37 U/L (ref 0–44)
AST: 25 U/L (ref 15–41)
Albumin: 4.2 g/dL (ref 3.5–5.0)
Alkaline Phosphatase: 79 U/L (ref 38–126)
Anion gap: 6 (ref 5–15)
BUN: 15 mg/dL (ref 8–23)
CO2: 29 mmol/L (ref 22–32)
Calcium: 9.4 mg/dL (ref 8.9–10.3)
Chloride: 105 mmol/L (ref 98–111)
Creatinine: 0.91 mg/dL (ref 0.44–1.00)
GFR, Estimated: >60 mL/min (ref >60)
Glucose, Bld: 229 mg/dL — ABNORMAL HIGH (ref 70–99)
Potassium: 4.1 mmol/L (ref 3.5–5.1)
Sodium: 140 mmol/L (ref 135–145)
Total Bilirubin: 0.8 mg/dL (ref 0.3–1.2)
Total Protein: 6.6 g/dL (ref 6.5–8.1)

## 2022-11-01 MED ORDER — DARATUMUMAB-HYALURONIDASE-FIHJ 1800-30000 MG-UT/15ML ~~LOC~~ SOLN
1800.0000 mg | Freq: Once | SUBCUTANEOUS | Status: AC
  Administered 2022-11-01: 1800 mg via SUBCUTANEOUS
  Filled 2022-11-01: qty 15

## 2022-11-01 MED ORDER — ACETAMINOPHEN 325 MG PO TABS
650.0000 mg | ORAL_TABLET | Freq: Once | ORAL | Status: AC
  Administered 2022-11-01: 650 mg via ORAL
  Filled 2022-11-01: qty 2

## 2022-11-01 MED ORDER — DIPHENHYDRAMINE HCL 25 MG PO CAPS
25.0000 mg | ORAL_CAPSULE | Freq: Once | ORAL | Status: AC
  Administered 2022-11-01: 25 mg via ORAL
  Filled 2022-11-01: qty 1

## 2022-11-01 MED ORDER — FAMOTIDINE 20 MG PO TABS
20.0000 mg | ORAL_TABLET | Freq: Once | ORAL | Status: AC
  Administered 2022-11-01: 20 mg via ORAL
  Filled 2022-11-01: qty 1

## 2022-11-01 NOTE — Patient Instructions (Signed)
[facility]  Discharge Instructions: Thank you for choosing [facility] to provide your oncology and hematology care.   If you have a lab appointment with the Cancer Center, please go directly to the Cancer Center and check in at the registration area.   Wear comfortable clothing and clothing appropriate for easy access to any Portacath or PICC line.   We strive to give you quality time with your provider. You may need to reschedule your appointment if you arrive late (15 or more minutes).  Arriving late affects you and other patients whose appointments are after yours.  Also, if you miss three or more appointments without notifying the office, you may be dismissed from the clinic at the provider's discretion.      For prescription refill requests, have your pharmacy contact our office and allow 72 hours for refills to be completed.    Today you received the following chemotherapy and/or immunotherapy agents : Darzalex Faspro      To help prevent nausea and vomiting after your treatment, we encourage you to take your nausea medication as directed.  BELOW ARE SYMPTOMS THAT SHOULD BE REPORTED IMMEDIATELY: *FEVER GREATER THAN 100.4 F (38 C) OR HIGHER *CHILLS OR SWEATING *NAUSEA AND VOMITING THAT IS NOT CONTROLLED WITH YOUR NAUSEA MEDICATION *UNUSUAL SHORTNESS OF BREATH *UNUSUAL BRUISING OR BLEEDING *URINARY PROBLEMS (pain or burning when urinating, or frequent urination) *BOWEL PROBLEMS (unusual diarrhea, constipation, pain near the anus) TENDERNESS IN MOUTH AND THROAT WITH OR WITHOUT PRESENCE OF ULCERS (sore throat, sores in mouth, or a toothache) UNUSUAL RASH, SWELLING OR PAIN  UNUSUAL VAGINAL DISCHARGE OR ITCHING   Items with * indicate a potential emergency and should be followed up as soon as possible or go to the Emergency Department if any problems should occur.  Please show the CHEMOTHERAPY ALERT CARD or IMMUNOTHERAPY ALERT CARD  at check-in to the Emergency Department and triage nurse.  Should you have questions after your visit or need to cancel or reschedule your appointment, please contact [facility]  Dept: [phone]  and follow the prompts.  Office hours are 8:00 a.m. to 4:30 p.m. Monday - Friday. Please note that voicemails left after 4:00 p.m. may not be returned until the following business day.  We are closed weekends and major holidays. You have access to a nurse at all times for urgent questions. Please call the main number to the clinic Dept: [phone] and follow the prompts.   For any non-urgent questions, you may also contact your provider using MyChart. We now offer e-Visits for anyone 18 and older to request care online for non-urgent symptoms. For details visit [email or web address].   Also download the MyChart app! Go to the app store, search "MyChart", open the app, select [facility], and log in with your MyChart username and password.  Masks are optional in the cancer centers. If you would like for your care team to wear a mask while they are taking care of you, please let them know. You may have one support Paula Johnson who is at least 69 years old accompany you for your appointments. 

## 2022-11-01 NOTE — Progress Notes (Signed)
HEMATOLOGY/ONCOLOGY CLINIC NOTE  Date of Service: 11/01/22   PCP: [name], MD Transplant Oncologist -- Dr Jeanann Lewandowsky MD  CHIEF COMPLAINT: Follow-up for continued evaluation and management of multiple myeloma  DIAGNOSIS: IgA lambda R-ISS stage II multiple myeloma with innumerable lytic lesions in the calvarium, lesion in T7 vertebra and multiple lesions in the pelvis.  Diagnosed in January 2017.   Current treatment: Revlimid '10mg'$  po daily 3 weeks on 1 week off (after treatment interruption) Daratumumab added 11/15 for loss of MRD negative status   Previous Treatment: Status post Vd x 1 cycle VRd x 6 cycles   HD Melphalan '200mg'$ /m2 on 03/22/2016 Autologous HSCT on 03/23/2016 with Dr Samule Ohm at [facility] (Dose of 6.4 x10^6/kg)   INTERVAL HISTORY:   Ms. Shappell is a 67 y.o. female is here for continued evaluation and management of multiple myeloma She is due for Cycle 4, Day 1 of Daratumumab. She is unaccompanied for this visit.   Ms. Wieser continues to do well without any new or concerning symptoms. She is tolerating treatment without any new side effect. Her energy is stable with intermittent episodes of fatigue. She is able to complete her ADLs on her own. Her appetite and weight are stable. She denies nausea, vomiting or abdominal pain. Her bowel habits are unchanged. She still has diarrhea but that is chronic. She denies easy bruising or signs of active bleeding. She denies fevers, chills, sweats, shortness of breath, chest pain or cough. She has no other complaints.    REVIEW OF SYSTEMS:   10 Point review of Systems was done is negative except as noted above.   PHYSICAL EXAMINATION: .BP 135/71 (BP Location: Right Arm, Patient Position: Sitting)   Pulse 70   Temp 97.7 F (36.5 C) (Temporal)   Resp 16   Ht 5' 7.5" (1.715 m)   Wt 131 lb 14.4 oz (59.8 kg)   LMP 09/26/1995 (Within Months)   SpO2 100%   BMI 20.35 kg/m   GENERAL:alert, in no acute distress  and comfortable SKIN: no acute rashes, no significant lesions EYES: conjunctiva are pink and non-injected, sclera anicteric LYMPH:  no palpable lymphadenopathy in the cervical or supraclavicular regions LUNGS: clear to auscultation b/l with normal respiratory effort HEART: regular rate & rhythm Extremity: no pedal edema PSYCH: alert & oriented x 3 with fluent speech NEURO: no focal motor/sensory deficits   MEDICAL HISTORY:   Past Medical History:  Diagnosis Date   Abdominal pain    Abnormal Pap smear of vagina 03/22/2017   ASCUS pap and negative HR HPV.  Patient is on immunosuppressive medication.    Allergic reaction to bee sting    Anemia    AR (aortic regurgitation) 11/30/2017   trace noted on ECHO   Atrial septal aneurysm per 11-30-17 echo   trivial pericardial effusion   Complicated migraine    Constipation    DDD (degenerative disc disease), cervical    DDD (degenerative disc disease), lumbosacral    Fatty liver    Female proctocele without uterine prolapse    Fibroid    reason for Hysterectomy   FTT (failure to thrive) in adult 08/09/2017   Gastroesophageal reflux disease without esophagitis 01/17/2017   Generalized anxiety disorder    Grade I diastolic dysfunction    Hammer toe 05/13/2018   Hematuria 11/08/2015   History of COVID-19 12/2020   History of stress incontinence    Hypergammaglobulinemia    Hyperlipidemia    Hypoxemia 12/16/2007   Insomnia  Leg length discrepancy    Loss of appetite    Lung nodule    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   Metastatic multiple myeloma to bone 10/19/2015   Metatarsalgia of right foot 07/15/2018   MR (mitral regurgitation) 11/30/2017   trace noted on ECHO   Muscular fasciculation    Nasal septal deviation    Nasal turbinate hypertrophy    Near syncope    Obstructive sleep apnea 12/03/2007   Other fatigue    Peripheral edema    Plasma cell dyscrasia    PONV (postoperative nausea and vomiting)    thinks  morphine caused nausea   Postoperative urinary retention 05/14/2018   Prediabetes    Pure hypercholesterolemia    Rash 11/01/2015   Restless leg syndrome    S/P autologous bone marrow transplantation 03/29/2016   Shortness of breath 12/04/2007   TR (tricuspid regurgitation) 11/30/2017   trace noted on ECHO   Urinary incontinence    UTI (urinary tract infection)    Vaginal granulation tissue 12/03/2017   Vitamin D deficiency    Wears glasses     SURGICAL HISTORY: Past Surgical History:  Procedure Laterality Date   ABDOMINAL HYSTERECTOMY  1997   TAH--ovaries remain--Dr. Newton Pigg   ANTERIOR AND POSTERIOR REPAIR N/A 05/14/2018   Procedure: ANTERIOR (CYSTOCELE) AND POSTERIOR REPAIR (RECTOCELE);  Surgeon: [name], MD;  Location: [facility];  Service: Gynecology;  Laterality: N/A;   BLADDER SUSPENSION N/A 05/14/2018   Procedure: TRANSVAGINAL TAPE (TVT) PROCEDURE exact midurethral sling;  Surgeon: [name], MD;  Location: [facility];  Service: Gynecology;  Laterality: N/A;   BONE MARROW TRANSPLANT  2017   done at [place] N/A 05/14/2018   Procedure: CYSTOSCOPY;  Surgeon: [name], MD;  Location: [facility];  Service: Gynecology;  Laterality: N/A;   CYSTOSCOPY N/A 05/30/2018   Procedure: CYSTOSCOPY;  Surgeon: [name], MD;  Location: [facility];  Service: Gynecology;  Laterality: N/A;   ROBOTIC ASSISTED LAPAROSCOPIC SACROCOLPOPEXY N/A 05/14/2018   Procedure: XI ROBOTIC ASSISTED LAPAROSCOPIC SACROCOLPOPEXY;  Surgeon: [name], MD;  Location: [facility];  Service: Gynecology;  Laterality: N/A;  6 hours OR time. Need extended stay recovery bed.   SALPINGOOPHORECTOMY Bilateral 05/14/2018   Procedure: SALPINGO OOPHORECTOMY;  Surgeon: [name], MD;  Location: [facility];  Service: Gynecology;  Laterality: Bilateral;   THYMECTOMY  1999   TRANSVAGINAL TAPE  (TVT) REMOVAL N/A 05/30/2018   Procedure: TRANSVAGINAL TAPE (TVT)  Revision;  Surgeon: [name], MD;  [facility];  Service: Gynecology;  Laterality: N/A;   WISDOM TOOTH EXTRACTION      SOCIAL HISTORY: Social History   Socioeconomic History   Marital status: Married    Spouse name: [name]   Number of children: 2   Years of education: 16   Highest education level: Bachelor's degree (e.g., BA, AB, BS)  Occupational History   Occupation: Retired    Comment: Merchandiser, retail [employer]  Tobacco Use   Smoking status: Never   Smokeless tobacco: Never  Vaping Use   Vaping Use: Never used  Substance and Sexual Activity   Alcohol use: Not Currently    Alcohol/week: 0.0 standard drinks of alcohol   Drug use: Never   Sexual activity: Not Currently    Partners: Male  Birth control/protection: Surgical    Comment: TAH--ovaries remain  Other Topics Concern   Not on file  Social History Narrative   Lives with husband   Caffeine- coffee, 2 cups daily   Right handed   Social Determinants of Health   Financial Resource Strain: Not on file  Food Insecurity: Not on file  Transportation Needs: Not on file  Physical Activity: Not on file  Stress: Not on file  Social Connections: Not on file  Intimate Partner Violence: Not on file    FAMILY HISTORY: Family History  Problem Relation Age of Onset   Hyperlipidemia Mother    Memory loss Mother        49s; believed to be age-related memory decline   COPD Father        dec age 26/s-smoked   Macular degeneration Father    Behavior problems Father        at the end of life   Diabetes Brother    Macular degeneration Brother    Suicidality Brother    Diabetes Maternal Grandmother    Heart attack Paternal Grandfather     ALLERGIES:  is allergic to ampicillin and penicillins.  MEDICATIONS:  . Current Outpatient Medications:    acyclovir (ZOVIRAX) 400 MG tablet, Take 1 tablet (400 mg total)  by mouth 2 (two) times daily., Disp: 60 tablet, Rfl: 11   Ascorbic Acid (VITAMIN C) 1000 MG tablet, Take 1,000 mg by mouth 2 (two) times daily., Disp: , Rfl:    aspirin EC 81 MG tablet, Take 81 mg by mouth daily., Disp: , Rfl:    b complex vitamins capsule, Take 1 capsule by mouth daily., Disp: , Rfl:    Cholecalciferol (VITAMIN D3) 30 MCG/15ML LIQD, Take by mouth. Pt. Takes 5000 IU daily, Disp: , Rfl:    Cyanocobalamin (B-12) 1000 MCG CAPS, , Disp: , Rfl:    dexamethasone (DECADRON) 4 MG tablet, Take 1 tablet (4 mg total) by mouth daily. Take for 2 days starting the night of chemotherapy., Disp: 20 tablet, Rfl: 4   EPINEPHrine 0.3 mg/0.3 mL IJ SOAJ injection, INJECT INTRAMUSCULARLLY AS DIRECTED AS NEEDED FOR ANAPHYLAXIS, Disp: , Rfl:    estradiol (ESTRACE) 0.1 MG/GM vaginal cream, INSERT ONEGRAM VAGINALLY TWICE A WEEK AT BEDTIME, Disp: 43 g, Rfl: 1   lenalidomide (REVLIMID) 10 MG capsule, TAKE ONE CAPSULE BY MOUTH DAILY FOR 21 DAYS, THEN 7 DAYS OFF, Disp: 21 capsule, Rfl: 0   LORazepam (ATIVAN) 0.5 MG tablet, Take 1 tablet (0.5 mg total) by mouth at bedtime as needed for sleep or anxiety., Disp: 30 tablet, Rfl: 0   Magnesium 400 MG CAPS, Take by mouth., Disp: , Rfl:    ondansetron (ZOFRAN) 8 MG tablet, Take 1 tablet (8 mg total) by mouth every 8 (eight) hours as needed for nausea or vomiting., Disp: 30 tablet, Rfl: 1   prochlorperazine (COMPAZINE) 10 MG tablet, Take 1 tablet (10 mg total) by mouth every 6 (six) hours as needed for nausea or vomiting., Disp: 30 tablet, Rfl: 1   LABORATORY DATA:  I have reviewed the data as listed     Latest Ref Rng & Units 11/01/2022   12:17 PM 10/18/2022    1:01 PM 10/06/2022   10:52 AM  CBC  WBC 4.0 - 10.5 K/uL 4.9  7.3  6.4   Hemoglobin 12.0 - 15.0 g/dL 13.3  12.8  12.9   Hematocrit 36.0 - 46.0 % 39.8  37.6  38.5   Platelets 150 - 400  K/uL 280  285  306       Latest Ref Rng & Units 11/01/2022   12:22 PM 10/18/2022    1:01 PM 10/06/2022   10:52 AM  CMP   Glucose 70 - 99 mg/dL 229  147  168   BUN 8 - 23 mg/dL '15  20  19   '$ Creatinine 0.44 - 1.00 mg/dL 0.91  0.92  0.73   Sodium 135 - 145 mmol/L 140  137  138   Potassium 3.5 - 5.1 mmol/L 4.1  4.2  3.7   Chloride 98 - 111 mmol/L 105  105  106   CO2 22 - 32 mmol/L '29  25  24   '$ Calcium 8.9 - 10.3 mg/dL 9.4  9.2  9.1   Total Protein 6.5 - 8.1 g/dL 6.6  6.8  6.7   Total Bilirubin 0.3 - 1.2 mg/dL 0.8  0.9  0.8   Alkaline Phos 38 - 126 U/L 79  66  70   AST 15 - 41 U/L '25  18  18   '$ ALT 0 - 44 U/L 37  19  23      10/10/2019 Flow Cytometry (Repeat BM Bx from [facility]):   10/07/18 Repeat BM Bx from [facility]:   Surgical Pathology                                Case: [id number]                               Authorizing Provider:  Richardson Landry, NP    Collected:           07/04/2022 1015             Ordering Location:     [facility]   Received:            07/04/2022 [place]                                    [facility]                                                   Pathologist:           Lelon Frohlich, MD                                                   Specimens:   A) - Bone Marrow, Aspirate                                                                        B) - Bone Marrow, Biopsy  C) - Bone Marrow, Clot                                                                 DIAGNOSIS   A-C. Peripheral blood and bone marrow (peripheral smear, aspirate, touch preparation, core biopsy, and clot section): - Low-level plasma cell neoplasm, see comment.   COMMENT: The core biopsy is 40% cellular. Cyclin-D1 immunohistochemical stain demonstrates very rare positive plasma cells (1%). Concurrent flow cytometry study ([id number]) shows a plasma cell population with an atypical immunophenotype, comprising approximately 0.04% of analyzed events.    Reviewed by resident/fellow Beola Cord   Electronically signed by Lelon Frohlich, MD on 07/10/2022 at 12:25 PM  Clinical Information   67 year old female with history of plasma cell myeloma.  Gross Examination   A. Peripheral blood and bone marrow labeled with patient's name and medical record number, one peripheral blood smear, three bone marrow aspirate smears and one bone marrow touch preparation are received.   B. "Bone marrow biopsy", in AZF.  Core biopsies of red-brown tissue 1.4 x 0.2 cm in aggregate are submitted in total as block B1 for decalcification.   C. "Bone marrow clot", in AZF.  A 1.8 x 1.8 x 0.3 cm aggregate of red-brown clot is submitted in total as block C1.    J.Elam/ Dr. Linzie Collin   Microscopic Examination        Lab Results  Component Value Date    WBC 5.2 07/04/2022    HGB 12.1 07/04/2022    PLT 364 07/04/2022    MCV 95 07/04/2022    RDWCV 14.6 (H) 07/04/2022    Peripheral blood and automated blood count:  RBCs: The red blood cells are adequate in number,  normocytic and normochromic, with minimal anisopoikilocytosis. WBCs: The white blood cells are adequate in number and show unremarkable morphology. Platelets: The platelets are adequate in number and show unremarkable morphology.   Bone marrow aspirate:  There are adequate cellular particles. M:E ratio is 3:1.  Granulopoiesis: Maturing myeloid elements are present and show complete maturation. Erythropoiesis:  Erythroid elements are present and show complete maturation. Megakaryocytes: Megakaryocytes are adequate in number and show unremarkable morphology.  There are slightly increased plasma cells with unremarkable morphology.   Bone marrow touch preparation: Adequate marrow elements are present, without additional findings to the aspirate smears.   Prussian blue stained aspirate smear: 0 of 4 stainable iron is present. No ringed sideroblasts are identified.   Bone marrow core biopsy:  The core biopsy is adequate for evaluation. The  cellularity is 40%. Maturing myeloid elements are present and show complete maturation. Maturing erythroid elements are present and show complete maturation.  Megakaryocytes are adequate in number and show unremarkable morphology. There are slightly increased plasma cells.   A reticulin stain of the core biopsy shows no significant reticulin fibrosis.   Aspirate clot section: There are adequate particles present, without additional findings to the core biopsy.   Immunohistochemical stains for Cyclin-D1, CD138, Kappa CISH, and Lambda CISH are performed on the core biopsy section (block B1). CD138, kappa, and lambda in-situ hybridization demonstrates 5-10% predominantly polytypic plasma cells. Cyclin-D1 demonstrates very rare positive plasma cells (1%).  Bone Marrow Differential   500 CELL DIFF   Cell Type Cell Count Reference Range  Cell Type Cell Count Reference Range Cell Type Cell Count Reference Range  Segs 23 (7-25) Lymphocytes 15 (3-20) Plasma Cells 8 (0-3.5)  Bands 3 (6-36)     Atypicals 0   RBC Precursors 22 (10-30)  Metas 2 (9-25) Lymphoblasts *   Pronormoblasts 0 (0-3)  Myelos 5 (8-15) Eosinophils 12 (0-4) M:E Ratio 3/1    Promyelos 1 (1-6) Basophils 0 (0-1) Iron 0 (Scale 0-4+)  Myeloblasts 1 (0-3.5) Monocytes 8 (0-2)                                                                                                     Performed by:    Tyrone Sage                                                                                                         July 04, 2022 12:21 PM  Additional Documentation   All immunohistochemistry, in situ hybridization tests and special stains performed at [facility] and reported herein were developed, validated and their performance characteristics determined by the [facility]. During the performance of these tests, appropriate positive and negative control slides are also performed and reviewed. All control  slides and internal controls (when applicable) demonstrate the expected immunoreactive patterns and/or nucleic acid hybridization. These ancillary studies were deemed medically necessary by the requesting pathologist. They were ordered following review of the H&E and clinical history except when part of a liver/kidney protocol or where clinical history (e.g. immunocompromised, critically ill, history of malignancy) clearly indicates. Some of the tests may not be cleared or approved by the U.S. Food and Drug Administration (FDA).  The FDA has determined that such clearance or approval is not necessary.  These tests are used for clinical purposes and should not be regarded as investigational or as research.  This laboratory is certified under the [place] (CLIA) as qualified to perform high complexity clinical testing. At least one block in this case underwent decalcification during its processing. The vast majority of immunohistochemical and hybridization assays were not validated on decalcified tissues. The results were interpreted with caution given the possibility of false negative results on decalcified specimens.  Attestation   All of the diagnostic evaluations on the enumerated specimens have been personally conducted by the pathologists involved in the care of this patient as indicated by the electronic signatures above.  Resulting Agency [place]   Specimen Collected: 07/04/22 10:15   Performed by: [place] Last Resulted: 07/10/22 12:25  Received From: [place]  Result Received: 07/14/22 09:58   View Encounter  Received Information Pathology - Bone Marrow (Order [id number])    suggestion  Information displayed in this report may not trend or trigger automated decision support.    Pathology - Bone Marrow Order: [id number] Component 4 wk ago  Case Report Surgical  Pathology                                Case: [id number]                               Authorizing Provider:  Richardson Landry, NP    Collected:           07/04/2022 1015             Ordering Location:     [facility]   Received:            07/04/2022 [place]                                    [facility]                                                   Pathologist:           Lelon Frohlich, MD                                                   Specimens:   A) - Bone Marrow, Aspirate                                                                        B) - Bone Marrow, Biopsy                                                                          C) - Bone Marrow, Clot                                                                DIAGNOSIS   A-C. Peripheral blood and bone marrow (peripheral smear, aspirate, touch preparation, core biopsy, and clot section): - Low-level plasma cell neoplasm, see comment.   COMMENT: The core biopsy is 40% cellular. Cyclin-D1 immunohistochemical stain demonstrates very rare positive plasma cells (1%). Concurrent flow cytometry study ([id number]) shows a  plasma cell population with an atypical immunophenotype, comprising approximately 0.04% of analyzed events.    Reviewed by resident/fellow Beola Cord  Electronically signed by Lelon Frohlich, MD on 07/10/2022 at 12:25 PM  Clinical Information   67 year old female with history of plasma cell myeloma.  Gross Examination   A. Peripheral blood and bone marrow labeled with patient's name and medical record number, one peripheral blood smear, three bone marrow aspirate smears and one bone marrow touch preparation are received.   B. "Bone marrow biopsy", in AZF.  Core biopsies of red-brown tissue 1.4 x 0.2 cm in aggregate are submitted in total as block B1 for decalcification.   C. "Bone marrow clot", in AZF.  A 1.8 x 1.8 x 0.3 cm aggregate of red-brown clot is  submitted in total as block C1.    J.Elam/ Dr. Linzie Collin     RADIOGRAPHIC STUDIES: I have personally reviewed the radiological images as listed and agreed with the findings in the report. No results found.  ASSESSMENT & PLAN:   67 y.o. caucasian female with  1. H/o Multiple myeloma - currently in remission.  But bone marrow biopsy shows she has turned MRD positive  2. IgA lambda R-ISS stage II multiple myeloma with innumerable lytic lesions in the calvarium, lesion in T7 vertebra and multiple lesions in the pelvis.  No significant bone pain at this time. Diagnosed in January 2017.  s/p treatment as noted above 10/07/18 BM Bx and Flow cytometry indicate that the pt continues to be in CR 10/10/2019 Flow Cytometry revealed "A. Bone Marrow (Plasma Cell Myeloma Minimal Residual Disease Detection by Flow Cytometry): Negative. No phenotypically abnormal plasma cells at or above the limit of detection identified."  PLAN:  -Labs from today were reviewed. WBC 4.9, Hgb 13.3, MCV 94.8, Plt 280. Creatinine, Calcium and LFTs normal.  -Glucose levels oscillate, today it is 229 mg/dL.Marland Kitchen Likely secondary to steroid therapy. Currently on dexamethasone 20 mg pretreatment and 4 mg daily post treatment x 2 days. Due to hyperglycemia, Dr. Bevin Mayall Limbo recommends to dose reduce dexamethasone to 12 mg pretreatment and discontinue posttreatment dose.  -Proceed with daratumumab infusion as planned without any dose modifications. Continue on Revlimid without any dose modifications.  -Continue acyclovir for VZV prophylaxis -Continue aspirin for VTE prophylaxis. -Patient has follow-up with Dr. Verlin Dike at [facility] in April 2024 for repeat bone marrow biopsy and MRD testing.  FOLLOW UP: Per integrated scheduling  All of the patient's questions were answered with apparent satisfaction. The patient knows to call the clinic with any problems, questions or concerns.  I have spent a total of 30 minutes minutes of face-to-face and  non-face-to-face time, preparing to see the patient, performing a medically appropriate examination, counseling and educating the patient,  documenting clinical information in the electronic health record,  and care coordination.   Dede Query PA-C Dept of Hematology and [place] at [facility] Phone: [phone]

## 2022-11-08 LAB — MULTIPLE MYELOMA PANEL, SERUM
Albumin SerPl Elph-Mcnc: 4 g/dL (ref 2.9–4.4)
Albumin/Glob SerPl: 1.7 (ref 0.7–1.7)
Alpha 1: 0.2 g/dL (ref 0.0–0.4)
Alpha2 Glob SerPl Elph-Mcnc: 0.7 g/dL (ref 0.4–1.0)
B-Globulin SerPl Elph-Mcnc: 0.9 g/dL (ref 0.7–1.3)
Gamma Glob SerPl Elph-Mcnc: 0.6 g/dL (ref 0.4–1.8)
Globulin, Total: 2.5 g/dL (ref 2.2–3.9)
IgA: 129 mg/dL (ref 87–352)
IgG (Immunoglobin G), Serum: 645 mg/dL (ref 586–1602)
IgM (Immunoglobulin M), Srm: 29 mg/dL (ref 26–217)
M Protein SerPl Elph-Mcnc: 0.2 g/dL — ABNORMAL HIGH
Total Protein ELP: 6.5 g/dL (ref 6.0–8.5)

## 2022-11-15 ENCOUNTER — Other Ambulatory Visit: Payer: Self-pay

## 2022-11-15 ENCOUNTER — Inpatient Hospital Stay: Payer: Medicare Other

## 2022-11-15 ENCOUNTER — Encounter: Payer: Self-pay | Admitting: Hematology

## 2022-11-15 VITALS — BP 131/65 | HR 100 | Temp 98.3°F | Resp 16 | Wt 131.0 lb

## 2022-11-15 DIAGNOSIS — C9001 Multiple myeloma in remission: Secondary | ICD-10-CM

## 2022-11-15 DIAGNOSIS — Z5112 Encounter for antineoplastic immunotherapy: Secondary | ICD-10-CM | POA: Diagnosis not present

## 2022-11-15 LAB — CBC WITH DIFFERENTIAL (CANCER CENTER ONLY)
Abs Immature Granulocytes: 0.02 10*3/uL (ref 0.00–0.07)
Basophils Absolute: 0 10*3/uL (ref 0.0–0.1)
Basophils Relative: 0 %
Eosinophils Absolute: 0 10*3/uL (ref 0.0–0.5)
Eosinophils Relative: 0 %
HCT: 40.9 % (ref 36.0–46.0)
Hemoglobin: 13.6 g/dL (ref 12.0–15.0)
Immature Granulocytes: 0 %
Lymphocytes Relative: 3 %
Lymphs Abs: 0.2 10*3/uL — ABNORMAL LOW (ref 0.7–4.0)
MCH: 31.7 pg (ref 26.0–34.0)
MCHC: 33.3 g/dL (ref 30.0–36.0)
MCV: 95.3 fL (ref 80.0–100.0)
Monocytes Absolute: 0.1 10*3/uL (ref 0.1–1.0)
Monocytes Relative: 1 %
Neutro Abs: 6.7 10*3/uL (ref 1.7–7.7)
Neutrophils Relative %: 96 %
Platelet Count: 272 10*3/uL (ref 150–400)
RBC: 4.29 MIL/uL (ref 3.87–5.11)
RDW: 14.3 % (ref 11.5–15.5)
WBC Count: 7 10*3/uL (ref 4.0–10.5)
nRBC: 0 % (ref 0.0–0.2)

## 2022-11-15 LAB — CMP (CANCER CENTER ONLY)
ALT: 23 U/L (ref 0–44)
AST: 21 U/L (ref 15–41)
Albumin: 4.3 g/dL (ref 3.5–5.0)
Alkaline Phosphatase: 75 U/L (ref 38–126)
Anion gap: 7 (ref 5–15)
BUN: 19 mg/dL (ref 8–23)
CO2: 28 mmol/L (ref 22–32)
Calcium: 9.2 mg/dL (ref 8.9–10.3)
Chloride: 104 mmol/L (ref 98–111)
Creatinine: 0.91 mg/dL (ref 0.44–1.00)
GFR, Estimated: >60 mL/min (ref >60)
Glucose, Bld: 264 mg/dL — ABNORMAL HIGH (ref 70–99)
Potassium: 4.5 mmol/L (ref 3.5–5.1)
Sodium: 139 mmol/L (ref 135–145)
Total Bilirubin: 0.8 mg/dL (ref 0.3–1.2)
Total Protein: 6.7 g/dL (ref 6.5–8.1)

## 2022-11-15 MED ORDER — DARATUMUMAB-HYALURONIDASE-FIHJ 1800-30000 MG-UT/15ML ~~LOC~~ SOLN
1800.0000 mg | Freq: Once | SUBCUTANEOUS | Status: AC
  Administered 2022-11-15: 1800 mg via SUBCUTANEOUS
  Filled 2022-11-15: qty 15

## 2022-11-15 MED ORDER — DIPHENHYDRAMINE HCL 25 MG PO CAPS
25.0000 mg | ORAL_CAPSULE | Freq: Once | ORAL | Status: AC
  Administered 2022-11-15: 25 mg via ORAL
  Filled 2022-11-15: qty 1

## 2022-11-15 MED ORDER — ACETAMINOPHEN 325 MG PO TABS
650.0000 mg | ORAL_TABLET | Freq: Once | ORAL | Status: AC
  Administered 2022-11-15: 650 mg via ORAL
  Filled 2022-11-15: qty 2

## 2022-11-15 MED ORDER — FAMOTIDINE 20 MG PO TABS
20.0000 mg | ORAL_TABLET | Freq: Once | ORAL | Status: AC
  Administered 2022-11-15: 20 mg via ORAL
  Filled 2022-11-15: qty 1

## 2022-11-15 NOTE — Progress Notes (Signed)
Per Dr Irene Limbo: ok to tx with HR 103

## 2022-11-15 NOTE — Progress Notes (Signed)
Per Dr Irene Limbo, ok to treat with HR of 103 today.  Patient states she took dexamethasone 12 mg this a.m.

## 2022-11-17 ENCOUNTER — Encounter: Payer: Self-pay | Admitting: Hematology

## 2022-11-24 ENCOUNTER — Other Ambulatory Visit: Payer: Self-pay

## 2022-11-24 DIAGNOSIS — C9001 Multiple myeloma in remission: Secondary | ICD-10-CM

## 2022-11-24 MED ORDER — LENALIDOMIDE 10 MG PO CAPS: ORAL_CAPSULE | ORAL | 0 refills | Status: DC

## 2022-11-28 ENCOUNTER — Other Ambulatory Visit: Payer: Self-pay

## 2022-11-28 DIAGNOSIS — C9001 Multiple myeloma in remission: Secondary | ICD-10-CM

## 2022-11-29 ENCOUNTER — Inpatient Hospital Stay: Payer: Medicare Other | Attending: Hematology

## 2022-11-29 ENCOUNTER — Inpatient Hospital Stay: Payer: Medicare Other

## 2022-11-29 ENCOUNTER — Inpatient Hospital Stay (HOSPITAL_BASED_OUTPATIENT_CLINIC_OR_DEPARTMENT_OTHER): Payer: Medicare Other | Admitting: Hematology

## 2022-11-29 VITALS — BP 134/74 | HR 88 | Temp 97.5°F | Resp 20 | Wt 131.1 lb

## 2022-11-29 DIAGNOSIS — C9001 Multiple myeloma in remission: Secondary | ICD-10-CM

## 2022-11-29 DIAGNOSIS — Z9481 Bone marrow transplant status: Secondary | ICD-10-CM | POA: Insufficient documentation

## 2022-11-29 DIAGNOSIS — Z5112 Encounter for antineoplastic immunotherapy: Secondary | ICD-10-CM | POA: Diagnosis not present

## 2022-11-29 DIAGNOSIS — Z79899 Other long term (current) drug therapy: Secondary | ICD-10-CM | POA: Insufficient documentation

## 2022-11-29 DIAGNOSIS — R739 Hyperglycemia, unspecified: Secondary | ICD-10-CM | POA: Insufficient documentation

## 2022-11-29 LAB — CMP (CANCER CENTER ONLY)
ALT: 28 U/L (ref 0–44)
AST: 26 U/L (ref 15–41)
Albumin: 4.5 g/dL (ref 3.5–5.0)
Alkaline Phosphatase: 72 U/L (ref 38–126)
Anion gap: 7 (ref 5–15)
BUN: 18 mg/dL (ref 8–23)
CO2: 27 mmol/L (ref 22–32)
Calcium: 9.7 mg/dL (ref 8.9–10.3)
Chloride: 106 mmol/L (ref 98–111)
Creatinine: 0.82 mg/dL (ref 0.44–1.00)
GFR, Estimated: >60 mL/min (ref >60)
Glucose, Bld: 137 mg/dL — ABNORMAL HIGH (ref 70–99)
Potassium: 4.1 mmol/L (ref 3.5–5.1)
Sodium: 140 mmol/L (ref 135–145)
Total Bilirubin: 1.1 mg/dL (ref 0.3–1.2)
Total Protein: 7.1 g/dL (ref 6.5–8.1)

## 2022-11-29 LAB — CBC WITH DIFFERENTIAL (CANCER CENTER ONLY)
Abs Immature Granulocytes: 0.01 10*3/uL (ref 0.00–0.07)
Basophils Absolute: 0 10*3/uL (ref 0.0–0.1)
Basophils Relative: 0 %
Eosinophils Absolute: 0 10*3/uL (ref 0.0–0.5)
Eosinophils Relative: 0 %
HCT: 39.1 % (ref 36.0–46.0)
Hemoglobin: 13.5 g/dL (ref 12.0–15.0)
Immature Granulocytes: 0 %
Lymphocytes Relative: 4 %
Lymphs Abs: 0.2 10*3/uL — ABNORMAL LOW (ref 0.7–4.0)
MCH: 32.3 pg (ref 26.0–34.0)
MCHC: 34.5 g/dL (ref 30.0–36.0)
MCV: 93.5 fL (ref 80.0–100.0)
Monocytes Absolute: 0.1 10*3/uL (ref 0.1–1.0)
Monocytes Relative: 2 %
Neutro Abs: 3.8 10*3/uL (ref 1.7–7.7)
Neutrophils Relative %: 94 %
Platelet Count: 283 10*3/uL (ref 150–400)
RBC: 4.18 MIL/uL (ref 3.87–5.11)
RDW: 14 % (ref 11.5–15.5)
WBC Count: 4.1 10*3/uL (ref 4.0–10.5)
nRBC: 0 % (ref 0.0–0.2)

## 2022-11-29 MED ORDER — DIPHENHYDRAMINE HCL 25 MG PO CAPS
25.0000 mg | ORAL_CAPSULE | Freq: Once | ORAL | Status: AC
  Administered 2022-11-29: 25 mg via ORAL
  Filled 2022-11-29: qty 1

## 2022-11-29 MED ORDER — DARATUMUMAB-HYALURONIDASE-FIHJ 1800-30000 MG-UT/15ML ~~LOC~~ SOLN
1800.0000 mg | Freq: Once | SUBCUTANEOUS | Status: AC
  Administered 2022-11-29: 1800 mg via SUBCUTANEOUS
  Filled 2022-11-29: qty 15

## 2022-11-29 MED ORDER — FAMOTIDINE 20 MG PO TABS
20.0000 mg | ORAL_TABLET | Freq: Once | ORAL | Status: AC
  Administered 2022-11-29: 20 mg via ORAL
  Filled 2022-11-29: qty 1

## 2022-11-29 MED ORDER — ACETAMINOPHEN 325 MG PO TABS
650.0000 mg | ORAL_TABLET | Freq: Once | ORAL | Status: AC
  Administered 2022-11-29: 650 mg via ORAL
  Filled 2022-11-29: qty 2

## 2022-11-29 NOTE — Progress Notes (Signed)
Patient seen by MD today  Vitals are within treatment parameters.  Labs reviewed: and are within treatment parameters./ CMP still processing  Per physician team, patient is ready for treatment and there are NO modifications to the treatment plan.

## 2022-11-29 NOTE — Progress Notes (Signed)
HEMATOLOGY/ONCOLOGY CLINIC NOTE  Date of Service: 11/29/22   PCP: [name], MD Transplant Oncologist -- Dr Jeanann Lewandowsky MD  CHIEF COMPLAINT: Follow-up for continued evaluation and management of multiple myeloma  DIAGNOSIS: IgA lambda R-ISS stage II multiple myeloma with innumerable lytic lesions in the calvarium, lesion in T7 vertebra and multiple lesions in the pelvis.  Diagnosed in January 2017.   Current treatment: Revlimid '10mg'$  po daily 3 weeks on 1 week off (after treatment interruption) Daratumumab added 11/15 for loss of MRD negative status   Previous Treatment: Status post Vd x 1 cycle VRd x 6 cycles   HD Melphalan '200mg'$ /m2 on 03/22/2016 Autologous HSCT on 03/23/2016 with Dr Samule Ohm at [facility] (Dose of 6.4 x10^6/kg)   INTERVAL HISTORY:   Paula Johnson is a 67 y.o. female is here for continued evaluation and management of multiple myeloma and  Daratumumab cycle 3 day 1.   Patient was last seen by me on 10/06/2022 and complained of slightly increased diarrhea. She was otherwise doing well overall with no new medical concerns.  Today, she reports that she has been doing well and has tolerated Dextremethosone 12 MG well. She takes this the day of treatment but does not take any the day after. She denies any infections issues, sore throat, skin rashes, or diarrhea. She has no issues tolerating Daratumumab. She denies any new allergic reactions.  She complains of an itchy black left eye which she attributes to a ruptured blood vessel. She will see an ophthalmologist in the next few weeks to have this evaluated. She denies exposing her eye to any poison ivy, touching here eyes while gardening or using any new makeup.   She does complain of difficultly sleeping so she takes Lorazepam 0.5 MG. She notes her blood sugar has increased over 200.   REVIEW OF SYSTEMS:    10 Point review of Systems was done is negative except as noted above.   PHYSICAL EXAMINATION: .LMP  09/26/1995 (Within Months)    GENERAL:alert, in no acute distress and comfortable SKIN: no acute rashes, no significant lesions EYES: conjunctiva are pink and non-injected, sclera anicteric OROPHARYNX: MMM, no exudates, no oropharyngeal erythema or ulceration NECK: supple, no JVD LYMPH:  no palpable lymphadenopathy in the cervical, axillary or inguinal regions LUNGS: clear to auscultation b/l with normal respiratory effort HEART: regular rate & rhythm ABDOMEN:  normoactive bowel sounds , non tender, not distended. Extremity: no pedal edema PSYCH: alert & oriented x 3 with fluent speech NEURO: no focal motor/sensory deficits   MEDICAL HISTORY:   Past Medical History:  Diagnosis Date   Abdominal pain    Abnormal Pap smear of vagina 03/22/2017   ASCUS pap and negative HR HPV.  Patient is on immunosuppressive medication.    Allergic reaction to bee sting    Anemia    AR (aortic regurgitation) 11/30/2017   trace noted on ECHO   Atrial septal aneurysm per 11-30-17 echo   trivial pericardial effusion   Complicated migraine    Constipation    DDD (degenerative disc disease), cervical    DDD (degenerative disc disease), lumbosacral    Fatty liver    Female proctocele without uterine prolapse    Fibroid    reason for Hysterectomy   FTT (failure to thrive) in adult 08/09/2017   Gastroesophageal reflux disease without esophagitis 01/17/2017   Generalized anxiety disorder    Grade I diastolic dysfunction    Hammer toe 05/13/2018   Hematuria 11/08/2015   History of COVID-19  12/2020   History of stress incontinence    Hypergammaglobulinemia    Hyperlipidemia    Hypoxemia 12/16/2007   Insomnia    Leg length discrepancy    Loss of appetite    Lung nodule    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   Metastatic multiple myeloma to bone 10/19/2015   Metatarsalgia of right foot 07/15/2018   MR (mitral regurgitation) 11/30/2017   trace noted on ECHO   Muscular fasciculation    Nasal  septal deviation    Nasal turbinate hypertrophy    Near syncope    Obstructive sleep apnea 12/03/2007   Other fatigue    Peripheral edema    Plasma cell dyscrasia    PONV (postoperative nausea and vomiting)    thinks morphine caused nausea   Postoperative urinary retention 05/14/2018   Prediabetes    Pure hypercholesterolemia    Rash 11/01/2015   Restless leg syndrome    S/P autologous bone marrow transplantation 03/29/2016   Shortness of breath 12/04/2007   TR (tricuspid regurgitation) 11/30/2017   trace noted on ECHO   Urinary incontinence    UTI (urinary tract infection)    Vaginal granulation tissue 12/03/2017   Vitamin D deficiency    Wears glasses     SURGICAL HISTORY: Past Surgical History:  Procedure Laterality Date   ABDOMINAL HYSTERECTOMY  1997   TAH--ovaries remain--Dr. Newton Pigg   ANTERIOR AND POSTERIOR REPAIR N/A 05/14/2018   Procedure: ANTERIOR (CYSTOCELE) AND POSTERIOR REPAIR (RECTOCELE);  Surgeon: [name], MD;  Location: [facility];  Service: Gynecology;  Laterality: N/A;   BLADDER SUSPENSION N/A 05/14/2018   Procedure: TRANSVAGINAL TAPE (TVT) PROCEDURE exact midurethral sling;  Surgeon: [name], MD;  Location: [facility];  Service: Gynecology;  Laterality: N/A;   BONE MARROW TRANSPLANT  2017   done at [facility] N/A 05/14/2018   Procedure: CYSTOSCOPY;  Surgeon: [name], MD;  Location: [facility];  Service: Gynecology;  Laterality: N/A;   CYSTOSCOPY N/A 05/30/2018   Procedure: CYSTOSCOPY;  Surgeon: [name], MD;  Location[facility];  Service: Gynecology;  Laterality: N/A;   ROBOTIC ASSISTED LAPAROSCOPIC SACROCOLPOPEXY N/A 05/14/2018   Procedure: XI ROBOTIC ASSISTED LAPAROSCOPIC SACROCOLPOPEXY;  Surgeon: [name], MD;  Location: [facility];  Service: Gynecology;  Laterality: N/A;  6 hours OR time. Need extended stay recovery bed.    SALPINGOOPHORECTOMY Bilateral 05/14/2018   Procedure: SALPINGO OOPHORECTOMY;  Surgeon: [name], MD;  Location: [facility];  Service: Gynecology;  Laterality: Bilateral;   THYMECTOMY  1999   TRANSVAGINAL TAPE (TVT) REMOVAL N/A 05/30/2018   Procedure: TRANSVAGINAL TAPE (TVT)  Revision;  Surgeon: [name], MD;  Location: [facility];  Service: Gynecology;  Laterality: N/A;   WISDOM TOOTH EXTRACTION      SOCIAL HISTORY: Social History   Socioeconomic History   Marital status: Married    Spouse name: [name]   Number of children: 2   Years of education: 16   Highest education level: Bachelor's degree (e.g., BA, AB, BS)  Occupational History   Occupation: Retired    Comment: [employer]  Tobacco Use   Smoking status: Never   Smokeless tobacco: Never  Vaping Use   Vaping Use: Never used  Substance and Sexual Activity   Alcohol use: Not Currently  Alcohol/week: 0.0 standard drinks of alcohol   Drug use: Never   Sexual activity: Not Currently    Partners: Male    Birth control/protection: Surgical    Comment: TAH--ovaries remain  Other Topics Concern   Not on file  Social History Narrative   Lives with husband   Caffeine- coffee, 2 cups daily   Right handed   Social Determinants of Health   Financial Resource Strain: Not on file  Food Insecurity: Not on file  Transportation Needs: Not on file  Physical Activity: Not on file  Stress: Not on file  Social Connections: Not on file  Intimate Partner Violence: Not on file    FAMILY HISTORY: Family History  Problem Relation Age of Onset   Hyperlipidemia Mother    Memory loss Mother        31s; believed to be age-related memory decline   COPD Father        dec age 65/s-smoked   Macular degeneration Father    Behavior problems Father        at the end of life   Diabetes Brother    Macular degeneration Brother    Suicidality Brother    Diabetes Maternal  Grandmother    Heart attack Paternal Grandfather     ALLERGIES:  is allergic to ampicillin and penicillins.  MEDICATIONS:  . Current Outpatient Medications:    acyclovir (ZOVIRAX) 400 MG tablet, Take 1 tablet (400 mg total) by mouth 2 (two) times daily., Disp: 60 tablet, Rfl: 11   Ascorbic Acid (VITAMIN C) 1000 MG tablet, Take 1,000 mg by mouth 2 (two) times daily., Disp: , Rfl:    aspirin EC 81 MG tablet, Take 81 mg by mouth daily., Disp: , Rfl:    b complex vitamins capsule, Take 1 capsule by mouth daily., Disp: , Rfl:    Cholecalciferol (VITAMIN D3) 30 MCG/15ML LIQD, Take by mouth. Pt. Takes 5000 IU daily, Disp: , Rfl:    Cyanocobalamin (B-12) 1000 MCG CAPS, , Disp: , Rfl:    dexamethasone (DECADRON) 4 MG tablet, Take 1 tablet (4 mg total) by mouth daily. Take for 2 days starting the night of chemotherapy., Disp: 20 tablet, Rfl: 4   EPINEPHrine 0.3 mg/0.3 mL IJ SOAJ injection, INJECT INTRAMUSCULARLLY AS DIRECTED AS NEEDED FOR ANAPHYLAXIS, Disp: , Rfl:    estradiol (ESTRACE) 0.1 MG/GM vaginal cream, INSERT ONEGRAM VAGINALLY TWICE A WEEK AT BEDTIME, Disp: 43 g, Rfl: 1   lenalidomide (REVLIMID) 10 MG capsule, TAKE ONE CAPSULE BY MOUTH DAILY FOR 21 DAYS, THEN 7 DAYS OFF, Disp: 21 capsule, Rfl: 0   LORazepam (ATIVAN) 0.5 MG tablet, Take 1 tablet (0.5 mg total) by mouth at bedtime as needed for sleep or anxiety., Disp: 30 tablet, Rfl: 0   Magnesium 400 MG CAPS, Take by mouth., Disp: , Rfl:    ondansetron (ZOFRAN) 8 MG tablet, Take 1 tablet (8 mg total) by mouth every 8 (eight) hours as needed for nausea or vomiting., Disp: 30 tablet, Rfl: 1   prochlorperazine (COMPAZINE) 10 MG tablet, Take 1 tablet (10 mg total) by mouth every 6 (six) hours as needed for nausea or vomiting., Disp: 30 tablet, Rfl: 1   LABORATORY DATA:  I have reviewed the data as listed     Latest Ref Rng & Units 11/15/2022   12:44 PM 11/01/2022   12:17 PM 10/18/2022    1:01 PM  CBC  WBC 4.0 - 10.5 K/uL 7.0  4.9  7.3    Hemoglobin 12.0 -  15.0 g/dL 13.6  13.3  12.8   Hematocrit 36.0 - 46.0 % 40.9  39.8  37.6   Platelets 150 - 400 K/uL 272  280  285       Latest Ref Rng & Units 11/15/2022   12:50 PM 11/01/2022   12:22 PM 10/18/2022    1:01 PM  CMP  Glucose 70 - 99 mg/dL 264  229  147   BUN 8 - 23 mg/dL '19  15  20   '$ Creatinine 0.44 - 1.00 mg/dL 0.91  0.91  0.92   Sodium 135 - 145 mmol/L 139  140  137   Potassium 3.5 - 5.1 mmol/L 4.5  4.1  4.2   Chloride 98 - 111 mmol/L 104  105  105   CO2 22 - 32 mmol/L '28  29  25   '$ Calcium 8.9 - 10.3 mg/dL 9.2  9.4  9.2   Total Protein 6.5 - 8.1 g/dL 6.7  6.6  6.8   Total Bilirubin 0.3 - 1.2 mg/dL 0.8  0.8  0.9   Alkaline Phos 38 - 126 U/L 75  79  66   AST 15 - 41 U/L '21  25  18   '$ ALT 0 - 44 U/L 23  37  19      10/10/2019 Flow Cytometry (Repeat BM Bx from [facility]):   10/07/18 Repeat BM Bx from [facility]:   Surgical Pathology                                Case: [id number]                               Authorizing Provider:  Richardson Landry, NP    Collected:           07/04/2022 1015             Ordering Location:     [facility]   Received:            07/04/2022 [place]                                    [facility]                                                   Pathologist:           Lelon Frohlich, MD                                                   Specimens:   A) - Bone Marrow, Aspirate  B) - Bone Marrow, Biopsy                                                                          C) - Bone Marrow, Clot                                                                 DIAGNOSIS   A-C. Peripheral blood and bone marrow (peripheral smear, aspirate, touch preparation, core biopsy, and clot section): - Low-level plasma cell neoplasm, see comment.   COMMENT: The core biopsy is 40% cellular. Cyclin-D1 immunohistochemical stain demonstrates very rare positive  plasma cells (1%). Concurrent flow cytometry study [id number]) shows a plasma cell population with an atypical immunophenotype, comprising approximately 0.04% of analyzed events.    Reviewed by resident/fellow Beola Cord  Electronically signed by Lelon Frohlich, MD on 07/10/2022 at 12:25 PM  Clinical Information   67 year old female with history of plasma cell myeloma.  Gross Examination   A. Peripheral blood and bone marrow labeled with patient's name and medical record number, one peripheral blood smear, three bone marrow aspirate smears and one bone marrow touch preparation are received.   B. "Bone marrow biopsy", in AZF.  Core biopsies of red-brown tissue 1.4 x 0.2 cm in aggregate are submitted in total as block B1 for decalcification.   C. "Bone marrow clot", in AZF.  A 1.8 x 1.8 x 0.3 cm aggregate of red-brown clot is submitted in total as block C1.    J.Elam/ Dr. Linzie Collin   Microscopic Examination        Lab Results  Component Value Date    WBC 5.2 07/04/2022    HGB 12.1 07/04/2022    PLT 364 07/04/2022    MCV 95 07/04/2022    RDWCV 14.6 (H) 07/04/2022    Peripheral blood and automated blood count:  RBCs: The red blood cells are adequate in number,  normocytic and normochromic, with minimal anisopoikilocytosis. WBCs: The white blood cells are adequate in number and show unremarkable morphology. Platelets: The platelets are adequate in number and show unremarkable morphology.   Bone marrow aspirate:  There are adequate cellular particles. M:E ratio is 3:1.  Granulopoiesis: Maturing myeloid elements are present and show complete maturation. Erythropoiesis:  Erythroid elements are present and show complete maturation. Megakaryocytes: Megakaryocytes are adequate in number and show unremarkable morphology.  There are slightly increased plasma cells with unremarkable morphology.   Bone marrow touch preparation: Adequate marrow elements are present, without additional  findings to the aspirate smears.   Prussian blue stained aspirate smear: 0 of 4 stainable iron is present. No ringed sideroblasts are identified.   Bone marrow core biopsy:  The core biopsy is adequate for evaluation. The cellularity is 40%. Maturing myeloid elements are present and show complete maturation. Maturing erythroid elements are present and show complete maturation.  Megakaryocytes are adequate in number and show unremarkable morphology. There are slightly increased plasma cells.   A reticulin stain of the core biopsy shows no  significant reticulin fibrosis.   Aspirate clot section: There are adequate particles present, without additional findings to the core biopsy.   Immunohistochemical stains for Cyclin-D1, CD138, Kappa CISH, and Lambda CISH are performed on the core biopsy section (block B1). CD138, kappa, and lambda in-situ hybridization demonstrates 5-10% predominantly polytypic plasma cells. Cyclin-D1 demonstrates very rare positive plasma cells (1%).  Bone Marrow Differential   500 CELL DIFF   Cell Type Cell Count Reference Range Cell Type Cell Count Reference Range Cell Type Cell Count Reference Range  Segs 23 (7-25) Lymphocytes 15 (3-20) Plasma Cells 8 (0-3.5)  Bands 3 (6-36)     Atypicals 0   RBC Precursors 22 (10-30)  Metas 2 (9-25) Lymphoblasts *   Pronormoblasts 0 (0-3)  Myelos 5 (8-15) Eosinophils 12 (0-4) M:E Ratio 3/1    Promyelos 1 (1-6) Basophils 0 (0-1) Iron 0 (Scale 0-4+)  Myeloblasts 1 (0-3.5) Monocytes 8 (0-2)                                                                                                     Performed by:    Tyrone Sage                                                                                                         July 04, 2022 12:21 PM  Additional Documentation   All immunohistochemistry, in situ hybridization tests and special stains performed at [facility] and reported herein were developed, validated and their  performance characteristics determined by the [facility]. During the performance of these tests, appropriate positive and negative control slides are also performed and reviewed. All control slides and internal controls (when applicable) demonstrate the expected immunoreactive patterns and/or nucleic acid hybridization. These ancillary studies were deemed medically necessary by the requesting pathologist. They were ordered following review of the H&E and clinical history except when part of a liver/kidney protocol or where clinical history (e.g. immunocompromised, critically ill, history of malignancy) clearly indicates. Some of the tests may not be cleared or approved by the U.S. Food and Drug Administration (FDA).  The FDA has determined that such clearance or approval is not necessary.  These tests are used for clinical purposes and should not be regarded as investigational or as research.  This laboratory is certified under the [place] (CLIA) as qualified to perform high complexity clinical testing. At least one block in this case underwent decalcification during its processing. The vast majority of immunohistochemical and hybridization assays were not validated on decalcified tissues. The results were interpreted with caution given the possibility of false negative results on decalcified specimens.  Attestation   All of the diagnostic evaluations on the enumerated specimens have been personally conducted by the pathologists involved in the care of this patient as indicated by the electronic signatures above.  Resulting Agency [place] [facility]   Specimen Collected: 07/04/22 10:15   Performed by: Langlois Last Resulted: 07/10/22 12:25  Received From: [place]  Result Received: 07/14/22 09:58   View Encounter      Received Information Pathology  - Bone Marrow (Order [id number])    suggestion  Information displayed in this report may not trend or trigger automated decision support.    Pathology - Bone Marrow Order: [id number] Component 4 wk ago  Case Report Surgical Pathology                                Case: [id number]                               Authorizing Provider:  Richardson Landry, NP    Collected:           07/04/2022 1015             Ordering Location:     [facility]   Received:            07/04/2022 [place]                                    [facility]                                                   Pathologist:           Lelon Frohlich, MD                                                   Specimens:   A) - Bone Marrow, Aspirate                                                                        B) - Bone Marrow, Biopsy                                                                          C) - Bone Marrow, Clot  DIAGNOSIS   A-C. Peripheral blood and bone marrow (peripheral smear, aspirate, touch preparation, core biopsy, and clot section): - Low-level plasma cell neoplasm, see comment.   COMMENT: The core biopsy is 40% cellular. Cyclin-D1 immunohistochemical stain demonstrates very rare positive plasma cells (1%). Concurrent flow cytometry study [id number]) shows a plasma cell population with an atypical immunophenotype, comprising approximately 0.04% of analyzed events.    Reviewed by resident/fellow Beola Cord  Electronically signed by Lelon Frohlich, MD on 07/10/2022 at 12:25 PM  Clinical Information   67 year old female with history of plasma cell myeloma.  Gross Examination   A. Peripheral blood and bone marrow labeled with patient's name and medical record number, one peripheral blood smear, three bone marrow aspirate smears and one bone marrow touch preparation are received.   B. "Bone marrow  biopsy", in AZF.  Core biopsies of red-brown tissue 1.4 x 0.2 cm in aggregate are submitted in total as block B1 for decalcification.   C. "Bone marrow clot", in AZF.  A 1.8 x 1.8 x 0.3 cm aggregate of red-brown clot is submitted in total as block C1.    J.Elam/ Dr. Linzie Collin     RADIOGRAPHIC STUDIES: I have personally reviewed the radiological images as listed and agreed with the findings in the report. No results found.  ASSESSMENT & PLAN:   67 y.o. caucasian female with  1. H/o Multiple myeloma - currently in remission.  But bone marrow biopsy shows she has turned MRD positive  2. IgA lambda R-ISS stage II multiple myeloma with innumerable lytic lesions in the calvarium, lesion in T7 vertebra and multiple lesions in the pelvis.  No significant bone pain at this time. Diagnosed in January 2017.  s/p treatment as noted above 10/07/18 BM Bx and Flow cytometry indicate that the pt continues to be in CR 10/10/2019 Flow Cytometry revealed "A. Bone Marrow (Plasma Cell Myeloma Minimal Residual Disease Detection by Flow Cytometry): Negative. No phenotypically abnormal plasma cells at or above the limit of detection identified."  PLAN:   -Discussed lab results on 11/29/2022 in detail with patient. CBC showed WBC of 4.1K, hemoglobin of 13.5, and platelets of 283K. -CMP stable in general -today's myeloma lab M spike of 0.2g/dl related to Daratumumab -last SPEP showed stable M spike of 0.2 g/dL of IgG kappa monoclonal protein likely due to the Daratumumab -Patient has follow-up with Dr. Verlin Dike at [facility] in April 2024 for repeat bone marrow biopsy and MRD testing. -elevated glucose may be treatment related spike.  -No new toxicities from her current treatment with daratumumab every 2 weeks and maintenance Revlimid. -recommend patient to monitor her glucose levels at home in mornings -elevated glucose level will be concerning if levels are consistently above 250 -Reduce Dexamethasone dose to 8  MG -Continue acyclovir for VZV prophylaxis -Continue aspirin for VTE prophylaxis.  FOLLOW-UP:  Per integrated scheduling  The total time spent in the appointment was 30 minutes* .  All of the patient's questions were answered with apparent satisfaction. The patient knows to call the clinic with any problems, questions or concerns.   Sullivan Lone MD MS AAHIVMS [facility] Hematology/Oncology Physician [facility]  .*Total Encounter Time as defined by the Centers for Medicare and Medicaid Services includes, in addition to the face-to-face time of a patient visit (documented in the note above) non-face-to-face time: obtaining and reviewing outside history, ordering and reviewing medications, tests or procedures, care coordination (communications with other health care professionals or caregivers) and documentation in the  medical record.   I,Mitra Faeizi,acting as a Education administrator for Sullivan Lone, MD.,have documented all relevant documentation on the behalf of Sullivan Lone, MD,as directed by  Sullivan Lone, MD while in the presence of Sullivan Lone, MD.  .I have reviewed the above documentation for accuracy and completeness, and I agree with the above. Brunetta Genera MD

## 2022-11-29 NOTE — Progress Notes (Signed)
Patient took her '12mg'$  of steroid at 0700 this am.

## 2022-11-29 NOTE — Patient Instructions (Signed)
[facility]  Discharge Instructions: Thank you for choosing [facility] to provide your oncology and hematology care.   If you have a lab appointment with the [facility], please go directly to the [facility] and check in at the registration area.   Wear comfortable clothing and clothing appropriate for easy access to any Portacath or PICC line.   We strive to give you quality time with your provider. You may need to reschedule your appointment if you arrive late (15 or more minutes).  Arriving late affects you and other patients whose appointments are after yours.  Also, if you miss three or more appointments without notifying the office, you may be dismissed from the clinic at the provider's discretion.      For prescription refill requests, have your pharmacy contact our office and allow 72 hours for refills to be completed.    Today you received the following chemotherapy and/or immunotherapy agents: Darzalex Faspro      To help prevent nausea and vomiting after your treatment, we encourage you to take your nausea medication as directed.  BELOW ARE SYMPTOMS THAT SHOULD BE REPORTED IMMEDIATELY: *FEVER GREATER THAN 100.4 F (38 C) OR HIGHER *CHILLS OR SWEATING *NAUSEA AND VOMITING THAT IS NOT CONTROLLED WITH YOUR NAUSEA MEDICATION *UNUSUAL SHORTNESS OF BREATH *UNUSUAL BRUISING OR BLEEDING *URINARY PROBLEMS (pain or burning when urinating, or frequent urination) *BOWEL PROBLEMS (unusual diarrhea, constipation, pain near the anus) TENDERNESS IN MOUTH AND THROAT WITH OR WITHOUT PRESENCE OF ULCERS (sore throat, sores in mouth, or a toothache) UNUSUAL RASH, SWELLING OR PAIN  UNUSUAL VAGINAL DISCHARGE OR ITCHING   Items with * indicate a potential emergency and should be followed up as soon as possible or go to the Emergency Department if any problems should occur.  Please show the CHEMOTHERAPY ALERT CARD or IMMUNOTHERAPY ALERT CARD at  check-in to the Emergency Department and triage nurse.  Should you have questions after your visit or need to cancel or reschedule your appointment, please contact [facility]  Dept: [phone]  and follow the prompts.  Office hours are 8:00 a.m. to 4:30 p.m. Monday - Friday. Please note that voicemails left after 4:00 p.m. may not be returned until the following business day.  We are closed weekends and major holidays. You have access to a nurse at all times for urgent questions. Please call the main number to the clinic Dept: [phone] and follow the prompts.   For any non-urgent questions, you may also contact your provider using MyChart. We now offer e-Visits for anyone 30 and older to request care online for non-urgent symptoms. For details visit [email or web address].   Also download the MyChart app! Go to the app store, search "MyChart", open the app, select [facility], and log in with your MyChart username and password.  Masks are optional in the cancer centers. If you would like for your care team to wear a mask while they are taking care of you, please let them know. You may have one support person who is at least 69 years old accompany you for your appointments.

## 2022-12-04 LAB — MULTIPLE MYELOMA PANEL, SERUM
Albumin SerPl Elph-Mcnc: 3.8 g/dL (ref 2.9–4.4)
Albumin/Glob SerPl: 1.5 (ref 0.7–1.7)
Alpha 1: 0.3 g/dL (ref 0.0–0.4)
Alpha2 Glob SerPl Elph-Mcnc: 0.7 g/dL (ref 0.4–1.0)
B-Globulin SerPl Elph-Mcnc: 1 g/dL (ref 0.7–1.3)
Gamma Glob SerPl Elph-Mcnc: 0.7 g/dL (ref 0.4–1.8)
Globulin, Total: 2.7 g/dL (ref 2.2–3.9)
IgA: 114 mg/dL (ref 87–352)
IgG (Immunoglobin G), Serum: 620 mg/dL (ref 586–1602)
IgM (Immunoglobulin M), Srm: 28 mg/dL (ref 26–217)
M Protein SerPl Elph-Mcnc: 0.2 g/dL — ABNORMAL HIGH
Total Protein ELP: 6.5 g/dL (ref 6.0–8.5)

## 2022-12-05 ENCOUNTER — Other Ambulatory Visit: Payer: Self-pay

## 2022-12-05 ENCOUNTER — Other Ambulatory Visit (HOSPITAL_COMMUNITY): Payer: Self-pay

## 2022-12-05 ENCOUNTER — Other Ambulatory Visit: Payer: Self-pay | Admitting: Hematology

## 2022-12-05 ENCOUNTER — Encounter: Payer: Self-pay | Admitting: Hematology

## 2022-12-05 DIAGNOSIS — C9001 Multiple myeloma in remission: Secondary | ICD-10-CM

## 2022-12-05 MED ORDER — DEXAMETHASONE 4 MG PO TABS
4.0000 mg | ORAL_TABLET | Freq: Every day | ORAL | 4 refills | Status: DC
  Filled 2022-12-05: qty 20, 20d supply, fill #0

## 2022-12-07 ENCOUNTER — Other Ambulatory Visit (HOSPITAL_COMMUNITY): Payer: Self-pay

## 2022-12-07 ENCOUNTER — Encounter: Payer: Self-pay | Admitting: Hematology

## 2022-12-11 ENCOUNTER — Other Ambulatory Visit (HOSPITAL_COMMUNITY): Payer: Self-pay

## 2022-12-11 ENCOUNTER — Telehealth: Payer: Self-pay

## 2022-12-11 ENCOUNTER — Other Ambulatory Visit: Payer: Self-pay

## 2022-12-11 DIAGNOSIS — C9001 Multiple myeloma in remission: Secondary | ICD-10-CM

## 2022-12-11 MED ORDER — DEXAMETHASONE 4 MG PO TABS
8.0000 mg | ORAL_TABLET | ORAL | 2 refills | Status: DC
  Filled 2022-12-11: qty 20, 56d supply, fill #0
  Filled 2022-12-15: qty 12, 90d supply, fill #0

## 2022-12-11 NOTE — Telephone Encounter (Signed)
refill 

## 2022-12-13 ENCOUNTER — Inpatient Hospital Stay: Payer: Medicare Other

## 2022-12-13 ENCOUNTER — Other Ambulatory Visit: Payer: Self-pay

## 2022-12-13 VITALS — BP 121/83 | HR 97 | Temp 98.2°F | Resp 18 | Ht 67.5 in | Wt 130.8 lb

## 2022-12-13 DIAGNOSIS — Z5112 Encounter for antineoplastic immunotherapy: Secondary | ICD-10-CM | POA: Diagnosis not present

## 2022-12-13 DIAGNOSIS — C9001 Multiple myeloma in remission: Secondary | ICD-10-CM

## 2022-12-13 LAB — CBC WITH DIFFERENTIAL (CANCER CENTER ONLY)
Abs Immature Granulocytes: 0.03 10*3/uL (ref 0.00–0.07)
Basophils Absolute: 0 10*3/uL (ref 0.0–0.1)
Basophils Relative: 0 %
Eosinophils Absolute: 0 10*3/uL (ref 0.0–0.5)
Eosinophils Relative: 0 %
HCT: 40.4 % (ref 36.0–46.0)
Hemoglobin: 13.8 g/dL (ref 12.0–15.0)
Immature Granulocytes: 0 %
Lymphocytes Relative: 3 %
Lymphs Abs: 0.2 10*3/uL — ABNORMAL LOW (ref 0.7–4.0)
MCH: 32.5 pg (ref 26.0–34.0)
MCHC: 34.2 g/dL (ref 30.0–36.0)
MCV: 95.3 fL (ref 80.0–100.0)
Monocytes Absolute: 0.1 10*3/uL (ref 0.1–1.0)
Monocytes Relative: 1 %
Neutro Abs: 6.8 10*3/uL (ref 1.7–7.7)
Neutrophils Relative %: 96 %
Platelet Count: 297 10*3/uL (ref 150–400)
RBC: 4.24 MIL/uL (ref 3.87–5.11)
RDW: 14 % (ref 11.5–15.5)
WBC Count: 7.2 10*3/uL (ref 4.0–10.5)
nRBC: 0 % (ref 0.0–0.2)

## 2022-12-13 LAB — COMPREHENSIVE METABOLIC PANEL
ALT: 25 U/L (ref 0–44)
AST: 19 U/L (ref 15–41)
Albumin: 4.6 g/dL (ref 3.5–5.0)
Alkaline Phosphatase: 75 U/L (ref 38–126)
Anion gap: 8 (ref 5–15)
BUN: 17 mg/dL (ref 8–23)
CO2: 27 mmol/L (ref 22–32)
Calcium: 10.1 mg/dL (ref 8.9–10.3)
Chloride: 103 mmol/L (ref 98–111)
Creatinine, Ser: 0.82 mg/dL (ref 0.44–1.00)
GFR, Estimated: >60 mL/min (ref >60)
Glucose, Bld: 137 mg/dL — ABNORMAL HIGH (ref 70–99)
Potassium: 4.5 mmol/L (ref 3.5–5.1)
Sodium: 138 mmol/L (ref 135–145)
Total Bilirubin: 0.7 mg/dL (ref 0.3–1.2)
Total Protein: 7 g/dL (ref 6.5–8.1)

## 2022-12-13 MED ORDER — DARATUMUMAB-HYALURONIDASE-FIHJ 1800-30000 MG-UT/15ML ~~LOC~~ SOLN
1800.0000 mg | Freq: Once | SUBCUTANEOUS | Status: AC
  Administered 2022-12-13: 1800 mg via SUBCUTANEOUS
  Filled 2022-12-13: qty 15

## 2022-12-13 MED ORDER — FAMOTIDINE 20 MG PO TABS
20.0000 mg | ORAL_TABLET | Freq: Once | ORAL | Status: AC
  Administered 2022-12-13: 20 mg via ORAL
  Filled 2022-12-13: qty 1

## 2022-12-13 MED ORDER — ACETAMINOPHEN 325 MG PO TABS
650.0000 mg | ORAL_TABLET | Freq: Once | ORAL | Status: AC
  Administered 2022-12-13: 650 mg via ORAL
  Filled 2022-12-13: qty 2

## 2022-12-13 MED ORDER — DIPHENHYDRAMINE HCL 25 MG PO CAPS
25.0000 mg | ORAL_CAPSULE | Freq: Once | ORAL | Status: AC
  Administered 2022-12-13: 25 mg via ORAL
  Filled 2022-12-13: qty 1

## 2022-12-15 ENCOUNTER — Other Ambulatory Visit (HOSPITAL_COMMUNITY): Payer: Self-pay

## 2022-12-22 ENCOUNTER — Other Ambulatory Visit: Payer: Self-pay

## 2022-12-22 DIAGNOSIS — C9001 Multiple myeloma in remission: Secondary | ICD-10-CM

## 2022-12-22 MED ORDER — LENALIDOMIDE 10 MG PO CAPS: ORAL_CAPSULE | ORAL | 0 refills | Status: DC

## 2022-12-26 ENCOUNTER — Other Ambulatory Visit: Payer: Self-pay

## 2022-12-27 ENCOUNTER — Inpatient Hospital Stay: Payer: Medicare Other

## 2022-12-27 ENCOUNTER — Telehealth: Payer: Self-pay | Admitting: Physician Assistant

## 2022-12-27 ENCOUNTER — Inpatient Hospital Stay: Payer: Medicare Other | Attending: Hematology | Admitting: Physician Assistant

## 2022-12-27 VITALS — BP 131/66 | HR 100 | Temp 97.7°F | Resp 20 | Wt 132.2 lb

## 2022-12-27 DIAGNOSIS — Z5112 Encounter for antineoplastic immunotherapy: Secondary | ICD-10-CM | POA: Insufficient documentation

## 2022-12-27 DIAGNOSIS — C9001 Multiple myeloma in remission: Secondary | ICD-10-CM

## 2022-12-27 DIAGNOSIS — Z79899 Other long term (current) drug therapy: Secondary | ICD-10-CM | POA: Insufficient documentation

## 2022-12-27 LAB — CBC WITH DIFFERENTIAL (CANCER CENTER ONLY)
Abs Immature Granulocytes: 0.02 10*3/uL (ref 0.00–0.07)
Basophils Absolute: 0 10*3/uL (ref 0.0–0.1)
Basophils Relative: 1 %
Eosinophils Absolute: 0 10*3/uL (ref 0.0–0.5)
Eosinophils Relative: 0 %
HCT: 39.7 % (ref 36.0–46.0)
Hemoglobin: 13.6 g/dL (ref 12.0–15.0)
Immature Granulocytes: 1 %
Lymphocytes Relative: 5 %
Lymphs Abs: 0.2 10*3/uL — ABNORMAL LOW (ref 0.7–4.0)
MCH: 32.4 pg (ref 26.0–34.0)
MCHC: 34.3 g/dL (ref 30.0–36.0)
MCV: 94.5 fL (ref 80.0–100.0)
Monocytes Absolute: 0.1 10*3/uL (ref 0.1–1.0)
Monocytes Relative: 2 %
Neutro Abs: 3.9 10*3/uL (ref 1.7–7.7)
Neutrophils Relative %: 91 %
Platelet Count: 268 10*3/uL (ref 150–400)
RBC: 4.2 MIL/uL (ref 3.87–5.11)
RDW: 13.8 % (ref 11.5–15.5)
WBC Count: 4.3 10*3/uL (ref 4.0–10.5)
nRBC: 0 % (ref 0.0–0.2)

## 2022-12-27 MED ORDER — FAMOTIDINE 20 MG PO TABS
20.0000 mg | ORAL_TABLET | Freq: Once | ORAL | Status: AC
  Administered 2022-12-27: 20 mg via ORAL
  Filled 2022-12-27: qty 1

## 2022-12-27 MED ORDER — DARATUMUMAB-HYALURONIDASE-FIHJ 1800-30000 MG-UT/15ML ~~LOC~~ SOLN
1800.0000 mg | Freq: Once | SUBCUTANEOUS | Status: AC
  Administered 2022-12-27: 1800 mg via SUBCUTANEOUS
  Filled 2022-12-27: qty 15

## 2022-12-27 MED ORDER — ACETAMINOPHEN 325 MG PO TABS
650.0000 mg | ORAL_TABLET | Freq: Once | ORAL | Status: AC
  Administered 2022-12-27: 650 mg via ORAL
  Filled 2022-12-27: qty 2

## 2022-12-27 MED ORDER — DIPHENHYDRAMINE HCL 25 MG PO CAPS
25.0000 mg | ORAL_CAPSULE | Freq: Once | ORAL | Status: AC
  Administered 2022-12-27: 25 mg via ORAL
  Filled 2022-12-27: qty 1

## 2022-12-27 NOTE — Progress Notes (Signed)
HEMATOLOGY/ONCOLOGY CLINIC NOTE  Date of Service: 12/27/22   PCP: [name], MD Transplant Oncologist -- Dr Jeanann Lewandowsky MD  CHIEF COMPLAINT: Follow-up for continued evaluation and management of multiple myeloma  DIAGNOSIS: IgA lambda R-ISS stage II multiple myeloma with innumerable lytic lesions in the calvarium, lesion in T7 vertebra and multiple lesions in the pelvis.  Diagnosed in January 2017.   Current treatment: Revlimid 10mg  po daily 3 weeks on 1 week off (after treatment interruption) Daratumumab added 11/15 for loss of MRD negative status   Previous Treatment: Status post Vd x 1 cycle VRd x 6 cycles   HD Melphalan 200mg /m2 on 03/22/2016 Autologous HSCT on 03/23/2016 with Dr Samule Ohm at [facility] (Dose of 6.4 x10^6/kg)   INTERVAL HISTORY:  Paula Johnson is a 67 y.o. female is here for continued evaluation and management of multiple myeloma and  Daratumumab cycle 6, day 1. She was last seen by Dr. Salem Lembke Lim[place] on 11/29/2022. She is unaccompanied for this visit.   Paula Johnson continues to do well without any limitations. She is tolerating Daratumumab and Revlimid therapy. She reports her appetite and energy levels are stable. She denies any nausea, vomiting or abdominal pain. Her bowel habits are unchanged. She denies easy bruising or signs of bleeding. She denies fevers, chills ,sweats, shortness of breath, chest pain or cough. She has no other complaints.  MEDICAL HISTORY:   Past Medical History:  Diagnosis Date   Abdominal pain    Abnormal Pap smear of vagina 03/22/2017   ASCUS pap and negative HR HPV.  Patient is on immunosuppressive medication.    Allergic reaction to bee sting    Anemia    AR (aortic regurgitation) 11/30/2017   trace noted on ECHO   Atrial septal aneurysm per 11-30-17 echo   trivial pericardial effusion   Complicated migraine    Constipation    DDD (degenerative disc disease), cervical    DDD (degenerative disc disease), lumbosacral    Fatty  liver    Female proctocele without uterine prolapse    Fibroid    reason for Hysterectomy   FTT (failure to thrive) in adult 08/09/2017   Gastroesophageal reflux disease without esophagitis 01/17/2017   Generalized anxiety disorder    Grade I diastolic dysfunction    Hammer toe 05/13/2018   Hematuria 11/08/2015   History of COVID-19 12/2020   History of stress incontinence    Hypergammaglobulinemia    Hyperlipidemia    Hypoxemia 12/16/2007   Insomnia    Leg length discrepancy    Loss of appetite    Lung nodule    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   Metastatic multiple myeloma to bone 10/19/2015   Metatarsalgia of right foot 07/15/2018   MR (mitral regurgitation) 11/30/2017   trace noted on ECHO   Muscular fasciculation    Nasal septal deviation    Nasal turbinate hypertrophy    Near syncope    Obstructive sleep apnea 12/03/2007   Other fatigue    Peripheral edema    Plasma cell dyscrasia    PONV (postoperative nausea and vomiting)    thinks morphine caused nausea   Postoperative urinary retention 05/14/2018   Prediabetes    Pure hypercholesterolemia    Rash 11/01/2015   Restless leg syndrome    S/P autologous bone marrow transplantation 03/29/2016   Shortness of breath 12/04/2007   TR (tricuspid regurgitation) 11/30/2017   trace noted on ECHO   Urinary incontinence    UTI (urinary tract infection)  Vaginal granulation tissue 12/03/2017   Vitamin D deficiency    Wears glasses     SURGICAL HISTORY: Past Surgical History:  Procedure Laterality Date   ABDOMINAL HYSTERECTOMY  1997   TAH--ovaries remain--Dr. Newton Pigg   ANTERIOR AND POSTERIOR REPAIR N/A 05/14/2018   Procedure: ANTERIOR (CYSTOCELE) AND POSTERIOR REPAIR (RECTOCELE);  Surgeon: [name], MD;  Location: [facility];  Service: Gynecology;  Laterality: N/A;   BLADDER SUSPENSION N/A 05/14/2018   Procedure: TRANSVAGINAL TAPE (TVT) PROCEDURE exact midurethral sling;  Surgeon: [name], MD;  Location: [facility];  Service: Gynecology;  Laterality: N/A;   BONE MARROW TRANSPLANT  2017   done at [facility] N/A 05/14/2018   Procedure: CYSTOSCOPY;  Surgeon: [name], MD;  Location: [facility];  Service: Gynecology;  Laterality: N/A;   CYSTOSCOPY N/A 05/30/2018   Procedure: CYSTOSCOPY;  Surgeon: [name], MD;  [facility];  Service: Gynecology;  Laterality: N/A;   ROBOTIC ASSISTED LAPAROSCOPIC SACROCOLPOPEXY N/A 05/14/2018   Procedure: XI ROBOTIC ASSISTED LAPAROSCOPIC SACROCOLPOPEXY;  Surgeon: [name], MD;  Location: [facility];  Service: Gynecology;  Laterality: N/A;  6 hours OR time. Need extended stay recovery bed.   SALPINGOOPHORECTOMY Bilateral 05/14/2018   Procedure: SALPINGO OOPHORECTOMY;  Surgeon: [name], MD;  Location: [facility];  Service: Gynecology;  Laterality: Bilateral;   THYMECTOMY  1999   TRANSVAGINAL TAPE (TVT) REMOVAL N/A 05/30/2018   Procedure: TRANSVAGINAL TAPE (TVT)  Revision;  Surgeon: [name], MD;  Location: [facility];  Service: Gynecology;  Laterality: N/A;   WISDOM TOOTH EXTRACTION      SOCIAL HISTORY: Social History   Socioeconomic History   Marital status: Married    Spouse name: [name]   Number of children: 2   Years of education: 16   Highest education level: Bachelor's degree (e.g., BA, AB, BS)  Occupational History   Occupation: Retired    Comment: Merchandiser, retail [employer]  Tobacco Use   Smoking status: Never   Smokeless tobacco: Never  Vaping Use   Vaping Use: Never used  Substance and Sexual Activity   Alcohol use: Not Currently    Alcohol/week: 0.0 standard drinks of alcohol   Drug use: Never   Sexual activity: Not Currently    Partners: Male    Birth control/protection: Surgical    Comment: TAH--ovaries remain  Other Topics Concern   Not on file  Social  History Narrative   Lives with husband   Caffeine- coffee, 2 cups daily   Right handed   Social Determinants of Health   Financial Resource Strain: Not on file  Food Insecurity: Not on file  Transportation Needs: Not on file  Physical Activity: Not on file  Stress: Not on file  Social Connections: Not on file  Intimate Partner Violence: Not on file    FAMILY HISTORY: Family History  Problem Relation Age of Onset   Hyperlipidemia Mother    Memory loss Mother        90s; believed to be age-related memory decline   COPD Father        dec age 33/s-smoked   Macular degeneration Father    Behavior problems Father        at the end of life   Diabetes Brother    Macular  degeneration Brother    Suicidality Brother    Diabetes Maternal Grandmother    Heart attack Paternal Grandfather     ALLERGIES:  is allergic to ampicillin and penicillins.  MEDICATIONS:  . Current Outpatient Medications:    acyclovir (ZOVIRAX) 400 MG tablet, Take 1 tablet (400 mg total) by mouth 2 (two) times daily., Disp: 60 tablet, Rfl: 11   Ascorbic Acid (VITAMIN C) 1000 MG tablet, Take 1,000 mg by mouth 2 (two) times daily., Disp: , Rfl:    aspirin EC 81 MG tablet, Take 81 mg by mouth daily., Disp: , Rfl:    b complex vitamins capsule, Take 1 capsule by mouth daily., Disp: , Rfl:    Cholecalciferol (VITAMIN D3) 30 MCG/15ML LIQD, Take by mouth. Pt. Takes 5000 IU daily, Disp: , Rfl:    Cyanocobalamin (B-12) 1000 MCG CAPS, , Disp: , Rfl:    dexamethasone (DECADRON) 4 MG tablet, Take 2 tablets (8 mg) the day of chemo treatment. (every 14 days), Disp: 20 tablet, Rfl: 2   EPINEPHrine 0.3 mg/0.3 mL IJ SOAJ injection, INJECT INTRAMUSCULARLLY AS DIRECTED AS NEEDED FOR ANAPHYLAXIS, Disp: , Rfl:    estradiol (ESTRACE) 0.1 MG/GM vaginal cream, INSERT ONEGRAM VAGINALLY TWICE A WEEK AT BEDTIME, Disp: 43 g, Rfl: 1   GLYCINE PO, Take 500 mg by mouth daily., Disp: , Rfl:    lenalidomide (REVLIMID) 10 MG capsule, TAKE ONE  CAPSULE BY MOUTH DAILY FOR 21 DAYS, THEN 7 DAYS OFF, Disp: 21 capsule, Rfl: 0   LORazepam (ATIVAN) 0.5 MG tablet, Take 1 tablet (0.5 mg total) by mouth at bedtime as needed for sleep or anxiety., Disp: 30 tablet, Rfl: 0   Magnesium 400 MG CAPS, Take by mouth., Disp: , Rfl:    ondansetron (ZOFRAN) 8 MG tablet, Take 1 tablet (8 mg total) by mouth every 8 (eight) hours as needed for nausea or vomiting., Disp: 30 tablet, Rfl: 1   prochlorperazine (COMPAZINE) 10 MG tablet, Take 1 tablet (10 mg total) by mouth every 6 (six) hours as needed for nausea or vomiting., Disp: 30 tablet, Rfl: 1   REVIEW OF SYSTEMS:    10 Point review of Systems was done is negative except as noted above.   PHYSICAL EXAMINATION: Vitals:   12/27/22 1255  BP: 131/66  Pulse: 100  Resp: 20  Temp: 97.7 F (36.5 C)  SpO2: 97%   GENERAL:alert, in no acute distress and comfortable SKIN: no acute rashes, no significant lesions EYES: conjunctiva are pink and non-injected, sclera anicteric LYMPH:  no palpable lymphadenopathy in the cervical and supraclavicular regions LUNGS: clear to auscultation b/l with normal respiratory effort HEART: regular rate & rhythm Extremity: no pedal edema PSYCH: alert & oriented x 3 with fluent speech NEURO: no focal motor/sensory deficits   LABORATORY DATA:  I have reviewed the data as listed     Latest Ref Rng & Units 12/27/2022   12:40 PM 12/13/2022   12:45 PM 11/29/2022   11:51 AM  CBC  WBC 4.0 - 10.5 K/uL 4.3  7.2  4.1   Hemoglobin 12.0 - 15.0 g/dL 13.6  13.8  13.5   Hematocrit 36.0 - 46.0 % 39.7  40.4  39.1   Platelets 150 - 400 K/uL 268  297  283       Latest Ref Rng & Units 12/13/2022   12:45 PM 11/29/2022   11:51 AM 11/15/2022   12:50 PM  CMP  Glucose 70 - 99 mg/dL 137  137  264  BUN 8 - 23 mg/dL 17  18  19    Creatinine 0.44 - 1.00 mg/dL 0.82  0.82  0.91   Sodium 135 - 145 mmol/L 138  140  139   Potassium 3.5 - 5.1 mmol/L 4.5  4.1  4.5   Chloride 98 - 111 mmol/L 103  106   104   CO2 22 - 32 mmol/L 27  27  28    Calcium 8.9 - 10.3 mg/dL 10.1  9.7  9.2   Total Protein 6.5 - 8.1 g/dL 7.0  7.1  6.7   Total Bilirubin 0.3 - 1.2 mg/dL 0.7  1.1  0.8   Alkaline Phos 38 - 126 U/L 75  72  75   AST 15 - 41 U/L 19  26  21    ALT 0 - 44 U/L 25  28  23       10/10/2019 Flow Cytometry (Repeat BM Bx from [facility]):   10/07/18 Repeat BM Bx from [facility]:   Surgical Pathology                                Case: [id number]                               Authorizing Provider:  Richardson Landry, NP    Collected:           07/04/2022 1015             Ordering Location:     [facility]   Received:            07/04/2022 [place]                                    [facility]                                                   Pathologist:           Lelon Frohlich, MD                                                   Specimens:   A) - Bone Marrow, Aspirate                                                                        B) - Bone Marrow, Biopsy  C) - Bone Marrow, Clot                                                                 DIAGNOSIS   A-C. Peripheral blood and bone marrow (peripheral smear, aspirate, touch preparation, core biopsy, and clot section): - Low-level plasma cell neoplasm, see comment.   COMMENT: The core biopsy is 40% cellular. Cyclin-D1 immunohistochemical stain demonstrates very rare positive plasma cells (1%). Concurrent flow cytometry study [id number]) shows a plasma cell population with an atypical immunophenotype, comprising approximately 0.04% of analyzed events.    Reviewed by resident/fellow Beola Cord  Electronically signed by Lelon Frohlich, MD on 07/10/2022 at 12:25 PM  Clinical Information   68 year old female with history of plasma cell myeloma.  Gross Examination   A. Peripheral blood and bone marrow labeled with patient's name and  medical record number, one peripheral blood smear, three bone marrow aspirate smears and one bone marrow touch preparation are received.   B. "Bone marrow biopsy", in AZF.  Core biopsies of red-brown tissue 1.4 x 0.2 cm in aggregate are submitted in total as block B1 for decalcification.   C. "Bone marrow clot", in AZF.  A 1.8 x 1.8 x 0.3 cm aggregate of red-brown clot is submitted in total as block C1.    J.[facility]lam/ Dr. Linzie Collin   Microscopic Examination         Peripheral blood and automated blood count:  RBCs: The red blood cells are adequate in number,  normocytic and normochromic, with minimal anisopoikilocytosis. WBCs: The white blood cells are adequate in number and show unremarkable morphology. Platelets: The platelets are adequate in number and show unremarkable morphology.   Bone marrow aspirate:  There are adequate cellular particles. M:E ratio is 3:1.  Granulopoiesis: Maturing myeloid elements are present and show complete maturation. Erythropoiesis:  Erythroid elements are present and show complete maturation. Megakaryocytes: Megakaryocytes are adequate in number and show unremarkable morphology.  There are slightly increased plasma cells with unremarkable morphology.   Bone marrow touch preparation: Adequate marrow elements are present, without additional findings to the aspirate smears.   Prussian blue stained aspirate smear: 0 of 4 stainable iron is present. No ringed sideroblasts are identified.   Bone marrow core biopsy:  The core biopsy is adequate for evaluation. The cellularity is 40%. Maturing myeloid elements are present and show complete maturation. Maturing erythroid elements are present and show complete maturation.  Megakaryocytes are adequate in number and show unremarkable morphology. There are slightly increased plasma cells.   A reticulin stain of the core biopsy shows no significant reticulin fibrosis.   Aspirate clot section: There are adequate particles  present, without additional findings to the core biopsy.   Immunohistochemical stains for Cyclin-D1, CD138, Kappa CISH, and Lambda CISH are performed on the core biopsy section (block B1). CD138, kappa, and lambda in-situ hybridization demonstrates 5-10% predominantly polytypic plasma cells. Cyclin-D1 demonstrates very rare positive plasma cells (1%).  Bone Marrow Differential   500 CELL DIFF   Cell Type Cell Count Reference Range Cell Type Cell Count Reference Range Cell Type Cell Count Reference Range  Segs 23 (7-25) Lymphocytes 15 (3-20) Plasma Cells 8 (0-3.5)  Bands 3 (6-36)     Atypicals 0   RBC Precursors 22 (10-30)  Metas 2 (9-25) Lymphoblasts *   Pronormoblasts 0 (0-3)  Myelos 5 (8-15) Eosinophils 12 (0-4) M:E Ratio 3/1    Promyelos 1 (1-6) Basophils 0 (0-1) Iron 0 (Scale 0-4+)  Myeloblasts 1 (0-3.5) Monocytes 8 (0-2)                                                                                                     Performed by:    Tyrone Sage                                                                                                         July 04, 2022 12:21 PM  Additional Documentation   All immunohistochemistry, in situ hybridization tests and special stains performed at [facility] and reported herein were developed, validated and their performance characteristics determined by the [facility]. During the performance of these tests, appropriate positive and negative control slides are also performed and reviewed. All control slides and internal controls (when applicable) demonstrate the expected immunoreactive patterns and/or nucleic acid hybridization. These ancillary studies were deemed medically necessary by the requesting pathologist. They were ordered following review of the H&E and clinical history except when part of a liver/kidney protocol or where clinical history (e.g. immunocompromised, critically ill, history of malignancy)  clearly indicates. Some of the tests may not be cleared or approved by the U.S. Food and Drug Administration (FDA).  The FDA has determined that such clearance or approval is not necessary.  These tests are used for clinical purposes and should not be regarded as investigational or as research.  This laboratory is certified under the [place] (CLIA) as qualified to perform high complexity clinical testing. At least one block in this case underwent decalcification during its processing. The vast majority of immunohistochemical and hybridization assays were not validated on decalcified tissues. The results were interpreted with caution given the possibility of false negative results on decalcified specimens.  Attestation   All of the diagnostic evaluations on the enumerated specimens have been personally conducted by the pathologists involved in the care of this patient as indicated by the electronic signatures above.  Resulting Agency [facility]   Specimen Collected: 07/04/22 10:15   Performed by: [place] Last Resulted: 07/10/22 12:25  Received From: [facility]  Result Received: 07/14/22 09:58   View Encounter      Received Information Pathology - Bone Marrow (Order [id number])    suggestion  Information displayed in this report may not trend or trigger automated decision support.    Pathology - Bone Marrow Order: [id number] Component  4 wk ago  Case Report Surgical Pathology                                Case: [id number]                               Authorizing Provider:  Richardson Landry, NP    Collected:           07/04/2022 1015             Ordering Location:     [facility]   Received:            07/04/2022 [place]                                    [facility]                                                   Pathologist:           Lelon Frohlich, MD                                                   Specimens:   A) - Bone Marrow, Aspirate                                                                        B) - Bone Marrow, Biopsy                                                                          C) - Bone Marrow, Clot                                                                DIAGNOSIS   A-C. Peripheral blood and bone marrow (peripheral smear, aspirate, touch preparation, core biopsy, and clot section): - Low-level plasma cell neoplasm, see comment.   COMMENT: The core biopsy is 40% cellular. Cyclin-D1 immunohistochemical stain demonstrates very rare positive plasma cells (1%). Concurrent flow cytometry study [id number]) shows a plasma cell population with an atypical immunophenotype, comprising approximately 0.04% of analyzed events.    Reviewed by resident/fellow Beola Cord  Electronically signed by Lelon Frohlich, MD on 07/10/2022 at 12:25 PM  Clinical  Information   67 year old female with history of plasma cell myeloma.  Gross Examination   A. Peripheral blood and bone marrow labeled with patient's name and medical record number, one peripheral blood smear, three bone marrow aspirate smears and one bone marrow touch preparation are received.   B. "Bone marrow biopsy", in AZF.  Core biopsies of red-brown tissue 1.4 x 0.2 cm in aggregate are submitted in total as block B1 for decalcification.   C. "Bone marrow clot", in AZF.  A 1.8 x 1.8 x 0.3 cm aggregate of red-brown clot is submitted in total as block C1.    J.Elam/ Dr. Linzie Collin     RADIOGRAPHIC STUDIES: I have personally reviewed the radiological images as listed and agreed with the findings in the report. No results found.  ASSESSMENT & PLAN:   67 y.o. caucasian female with  1. H/o Multiple myeloma - currently in remission.  But bone marrow biopsy shows she has turned MRD positive  2. IgA lambda R-ISS stage II multiple myeloma with  innumerable lytic lesions in the calvarium, lesion in T7 vertebra and multiple lesions in the pelvis.  No significant bone pain at this time. Diagnosed in January 2017.  s/p treatment as noted above 10/07/18 BM Bx and Flow cytometry indicate that the pt continues to be in CR 10/10/2019 Flow Cytometry revealed "A. Bone Marrow (Plasma Cell Myeloma Minimal Residual Disease Detection by Flow Cytometry): Negative. No phenotypically abnormal plasma cells at or above the limit of detection identified."  PLAN:  -Due for Cycle 6, Day 1 of daratumumab today,  -Labs from today reviewed and require no intervention. No cytopenias identified.  -last SPEP/IFE from 11/29/2022 showed stable M spike of 0.2 g/dL of IgG kappa monoclonal protein likely due to the Daratumumab -Patient has BMBx on 01/10/23 at [facility] followed by follow up with Dr. Verlin Dike on 01/30/2023.  -Reduce Dexamethasone dose to 8 MG -Continue acyclovir for VZV prophylaxis -Continue aspirin for VTE prophylaxis.  FOLLOW-UP:  Per integrated scheduling   All of the patient's questions were answered with apparent satisfaction. The patient knows to call the clinic with any problems, questions or concerns.  I have spent a total of 30 minutes minutes of face-to-face and non-face-to-face time, preparing to see the patient, performing a medically appropriate examination, counseling and educating the patient,documenting clinical information in the electronic health record, and care coordination.   Dede Query PA-C Dept of Hematology and [place] at [facility] Phone: [phone]

## 2022-12-27 NOTE — Telephone Encounter (Signed)
Reached out to patient per 4/3 LOS left voicemail.

## 2022-12-28 ENCOUNTER — Other Ambulatory Visit: Payer: Self-pay

## 2022-12-28 NOTE — Progress Notes (Signed)
HPI: Follow-up history of aortic insufficiency and atrial septal aneurysm.  Previously followed by Dr. Katrinka Blazing but transitioning to me.  Cardiac catheterization in 2009 showed no coronary disease.  Echocardiogram March 2021 showed normal LV function, grade 1 diastolic dysfunction, mild aortic insufficiency.  Since last seen the patient denies any dyspnea on exertion, orthopnea, PND, pedal edema, palpitations, syncope or chest pain.   Current Outpatient Medications  Medication Sig Dispense Refill   acyclovir (ZOVIRAX) 400 MG tablet Take 1 tablet (400 mg total) by mouth 2 (two) times daily. 60 tablet 11   Ascorbic Acid (VITAMIN C) 1000 MG tablet Take 1,000 mg by mouth 2 (two) times daily.     aspirin EC 81 MG tablet Take 81 mg by mouth daily.     b complex vitamins capsule Take 1 capsule by mouth daily.     Cholecalciferol (VITAMIN D3) 30 MCG/15ML LIQD Take by mouth. Pt. Takes 5000 IU daily     Cyanocobalamin (B-12) 1000 MCG CAPS      dexamethasone (DECADRON) 4 MG tablet Take 2 tablets (8 mg) the day of chemo treatment. (every 14 days) 20 tablet 2   EPINEPHrine 0.3 mg/0.3 mL IJ SOAJ injection INJECT INTRAMUSCULARLLY AS DIRECTED AS NEEDED FOR ANAPHYLAXIS     estradiol (ESTRACE) 0.1 MG/GM vaginal cream INSERT ONEGRAM VAGINALLY TWICE A WEEK AT BEDTIME 43 g 1   GLYCINE PO Take 500 mg by mouth daily.     lenalidomide (REVLIMID) 10 MG capsule TAKE ONE CAPSULE BY MOUTH DAILY FOR 21 DAYS, THEN 7 DAYS OFF 21 capsule 0   LORazepam (ATIVAN) 0.5 MG tablet Take 1 tablet (0.5 mg total) by mouth at bedtime as needed for sleep or anxiety. 30 tablet 0   Magnesium 400 MG CAPS Take by mouth.     ondansetron (ZOFRAN) 8 MG tablet Take 1 tablet (8 mg total) by mouth every 8 (eight) hours as needed for nausea or vomiting. 30 tablet 1   prochlorperazine (COMPAZINE) 10 MG tablet Take 1 tablet (10 mg total) by mouth every 6 (six) hours as needed for nausea or vomiting. 30 tablet 1   No current facility-administered  medications for this visit.     Past Medical History:  Diagnosis Date   Abnormal Pap smear of vagina 03/22/2017   ASCUS pap and negative HR HPV.  Patient is on immunosuppressive medication.    Allergic reaction to bee sting    Anemia    AR (aortic regurgitation) 11/30/2017   trace noted on ECHO   Atrial septal aneurysm per 11-30-17 echo   trivial pericardial effusion   Complicated migraine    Constipation    DDD (degenerative disc disease), cervical    DDD (degenerative disc disease), lumbosacral    Fatty liver    Female proctocele without uterine prolapse    Fibroid    reason for Hysterectomy   FTT (failure to thrive) in adult 08/09/2017   Gastroesophageal reflux disease without esophagitis 01/17/2017   Generalized anxiety disorder    Grade I diastolic dysfunction    Hammer toe 05/13/2018   Hematuria 11/08/2015   History of COVID-19 12/2020   History of stress incontinence    Hypergammaglobulinemia    Hyperlipidemia    Hypoxemia 12/16/2007   Insomnia    Leg length discrepancy    Loss of appetite    Lung nodule    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   Metastatic multiple myeloma to bone 10/19/2015   Metatarsalgia of right foot 07/15/2018  MR (mitral regurgitation) 11/30/2017   trace noted on ECHO   Muscular fasciculation    Nasal septal deviation    Nasal turbinate hypertrophy    Near syncope    Obstructive sleep apnea 12/03/2007   Other fatigue    Peripheral edema    Plasma cell dyscrasia    PONV (postoperative nausea and vomiting)    thinks morphine caused nausea   Postoperative urinary retention 05/14/2018   Prediabetes    Pure hypercholesterolemia    Rash 11/01/2015   Restless leg syndrome    S/P autologous bone marrow transplantation 03/29/2016   Shortness of breath 12/04/2007   TR (tricuspid regurgitation) 11/30/2017   trace noted on ECHO   Urinary incontinence    UTI (urinary tract infection)    Vaginal granulation tissue 12/03/2017   Vitamin  D deficiency    Wears glasses     Past Surgical History:  Procedure Laterality Date   ABDOMINAL HYSTERECTOMY  1997   TAH--ovaries remain--Dr. Tracey Harries   ANTERIOR AND POSTERIOR REPAIR N/A 05/14/2018   Procedure: ANTERIOR (CYSTOCELE) AND POSTERIOR REPAIR (RECTOCELE);  Surgeon: Patton Salles, MD;  Location: [facility];  Service: Gynecology;  Laterality: N/A;   BLADDER SUSPENSION N/A 05/14/2018   Procedure: TRANSVAGINAL TAPE (TVT) PROCEDURE exact midurethral sling;  Surgeon: Patton Salles, MD;  Location: [facility];  Service: Gynecology;  Laterality: N/A;   BONE MARROW TRANSPLANT  2017   done at [facility]   BREAST BIOPSY     COLONOSCOPY     CYSTOSCOPY N/A 05/14/2018   Procedure: CYSTOSCOPY;  Surgeon: Patton Salles, MD;  Location: [facility];  Service: Gynecology;  Laterality: N/A;   CYSTOSCOPY N/A 05/30/2018   Procedure: CYSTOSCOPY;  Surgeon: Patton Salles, MD;  [facility];  Service: Gynecology;  Laterality: N/A;   ROBOTIC ASSISTED LAPAROSCOPIC SACROCOLPOPEXY N/A 05/14/2018   Procedure: XI ROBOTIC ASSISTED LAPAROSCOPIC SACROCOLPOPEXY;  Surgeon: Patton Salles, MD;  Location: [facility];  Service: Gynecology;  Laterality: N/A;  6 hours OR time. Need extended stay recovery bed.   SALPINGOOPHORECTOMY Bilateral 05/14/2018   Procedure: SALPINGO OOPHORECTOMY;  Surgeon: Patton Salles, MD;  Location: [facility];  Service: Gynecology;  Laterality: Bilateral;   THYMECTOMY  1999   TRANSVAGINAL TAPE (TVT) REMOVAL N/A 05/30/2018   Procedure: TRANSVAGINAL TAPE (TVT)  Revision;  Surgeon: Patton Salles, MD;  [facility]  Service: Gynecology;  Laterality: N/A;   WISDOM TOOTH EXTRACTION      Social History   Socioeconomic History   Marital status: Married    Spouse name: Leonette Most   Number of children: 2   Years of education: 16   Highest education level: Bachelor's degree (e.g., BA, AB, BS)   Occupational History   Occupation: Retired    Comment: Engineer, 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  Tobacco Use   Smoking status: Never   Smokeless tobacco: Never  Vaping Use   Vaping Use: Never used  Substance and Sexual Activity   Alcohol use: Not Currently    Alcohol/week: 0.0 standard drinks of alcohol   Drug use: Never   Sexual activity: Not Currently    Partners: Male    Birth control/protection: Surgical    Comment: TAH--ovaries remain  Other Topics Concern   Not on file  Social History Narrative   Lives with husband   Caffeine- coffee, 2 cups daily   Right handed   Social  Determinants of Health   Financial Resource Strain: Not on file  Food Insecurity: Not on file  Transportation Needs: Not on file  Physical Activity: Not on file  Stress: Not on file  Social Connections: Not on file  Intimate Partner Violence: Not on file    Family History  Problem Relation Age of Onset   Hyperlipidemia Mother    Memory loss Mother        98s; believed to be age-related memory decline   COPD Father        dec age 49/s-smoked   Macular degeneration Father    Behavior problems Father        at the end of life   Diabetes Brother    Macular degeneration Brother    Suicidality Brother    Diabetes Maternal Grandmother    Heart attack Paternal Grandfather     ROS: no fevers or chills, productive cough, hemoptysis, dysphasia, odynophagia, melena, hematochezia, dysuria, hematuria, rash, seizure activity, orthopnea, PND, pedal edema, claudication. Remaining systems are negative.  Physical Exam: Well-developed well-nourished in no acute distress.  Skin is warm and dry.  HEENT is normal.  Neck is supple.  Chest is clear to auscultation with normal expansion.  Cardiovascular exam is regular rate and rhythm.  Abdominal exam nontender or distended. No masses palpated. Extremities show no edema. neuro grossly intact  ECG-normal sinus rhythm at a rate of 75, right bundle branch block, cannot  rule out prior inferior infarct.  Unchanged compared to November 26, 2019.  Personally reviewed  A/P  1 aortic insufficiency-mild on most recent echocardiogram.  Will repeat study.  2 hyperlipidemia-management per primary care.  3 sleep apnea-she uses an oral prosthesis.  Olga Millers, MD

## 2022-12-30 ENCOUNTER — Other Ambulatory Visit: Payer: Self-pay

## 2023-01-01 LAB — MULTIPLE MYELOMA PANEL, SERUM
Albumin SerPl Elph-Mcnc: 4.2 g/dL (ref 2.9–4.4)
Albumin/Glob SerPl: 1.8 — ABNORMAL HIGH (ref 0.7–1.7)
Alpha 1: 0.2 g/dL (ref 0.0–0.4)
Alpha2 Glob SerPl Elph-Mcnc: 0.7 g/dL (ref 0.4–1.0)
B-Globulin SerPl Elph-Mcnc: 1 g/dL (ref 0.7–1.3)
Gamma Glob SerPl Elph-Mcnc: 0.5 g/dL (ref 0.4–1.8)
Globulin, Total: 2.4 g/dL (ref 2.2–3.9)
IgA: 122 mg/dL (ref 87–352)
IgG (Immunoglobin G), Serum: 613 mg/dL (ref 586–1602)
IgM (Immunoglobulin M), Srm: 25 mg/dL — ABNORMAL LOW (ref 26–217)
M Protein SerPl Elph-Mcnc: 0.2 g/dL — ABNORMAL HIGH
Total Protein ELP: 6.6 g/dL (ref 6.0–8.5)

## 2023-01-04 ENCOUNTER — Encounter: Payer: Self-pay | Admitting: Cardiology

## 2023-01-04 ENCOUNTER — Ambulatory Visit: Payer: Medicare Other | Attending: Cardiology | Admitting: Cardiology

## 2023-01-04 VITALS — BP 112/88 | HR 75 | Ht 68.0 in | Wt 131.6 lb

## 2023-01-04 DIAGNOSIS — I359 Nonrheumatic aortic valve disorder, unspecified: Secondary | ICD-10-CM | POA: Diagnosis not present

## 2023-01-04 DIAGNOSIS — E7800 Pure hypercholesterolemia, unspecified: Secondary | ICD-10-CM | POA: Diagnosis not present

## 2023-01-04 DIAGNOSIS — G4733 Obstructive sleep apnea (adult) (pediatric): Secondary | ICD-10-CM | POA: Insufficient documentation

## 2023-01-04 NOTE — Patient Instructions (Signed)
  Testing/Procedures:  Your physician has requested that you have an echocardiogram. Echocardiography is a painless test that uses sound waves to create images of your heart. It provides your doctor with information about the size and shape of your heart and how well your heart's chambers and valves are working. This procedure takes approximately one hour. There are no restrictions for this procedure. Please do NOT wear cologne, perfume, aftershave, or lotions (deodorant is allowed). Please arrive 15 minutes prior to your appointment time. [address]   Follow-Up: At [facility], you and your health needs are our priority.  As part of our continuing mission to provide you with exceptional heart care, we have created designated Provider Care Teams.  These Care Teams include your primary Cardiologist (physician) and Advanced Practice Providers (APPs -  Physician Assistants and Nurse Practitioners) who all work together to provide you with the care you need, when you need it.  We recommend signing up for the patient portal called "MyChart".  Sign up information is provided on this After Visit Summary.  MyChart is used to connect with patients for Virtual Visits (Telemedicine).  Patients are able to view lab/test results, encounter notes, upcoming appointments, etc.  Non-urgent messages can be sent to your provider as well.   To learn more about what you can do with MyChart, go to [email or web address].    Your next appointment:   12 month(s)  Provider:   BRIAN CRENSHAW MD    

## 2023-01-05 ENCOUNTER — Encounter: Payer: Self-pay | Admitting: Hematology

## 2023-01-08 ENCOUNTER — Ambulatory Visit (HOSPITAL_COMMUNITY): Payer: Medicare Other | Attending: Cardiology

## 2023-01-08 DIAGNOSIS — I359 Nonrheumatic aortic valve disorder, unspecified: Secondary | ICD-10-CM | POA: Diagnosis present

## 2023-01-08 LAB — ECHOCARDIOGRAM COMPLETE
Area-P 1/2: 3.65 cm2
P 1/2 time: 549 msec
S' Lateral: 2.3 cm

## 2023-01-10 ENCOUNTER — Inpatient Hospital Stay: Payer: Medicare Other

## 2023-01-10 ENCOUNTER — Other Ambulatory Visit: Payer: Self-pay | Admitting: Hematology

## 2023-01-10 ENCOUNTER — Other Ambulatory Visit: Payer: Medicare Other

## 2023-01-10 VITALS — BP 107/66 | HR 86 | Temp 98.0°F | Resp 18 | Wt 132.0 lb

## 2023-01-10 DIAGNOSIS — Z5112 Encounter for antineoplastic immunotherapy: Secondary | ICD-10-CM | POA: Diagnosis not present

## 2023-01-10 DIAGNOSIS — C9001 Multiple myeloma in remission: Secondary | ICD-10-CM

## 2023-01-10 MED ORDER — DIPHENHYDRAMINE HCL 25 MG PO CAPS
25.0000 mg | ORAL_CAPSULE | Freq: Once | ORAL | Status: AC
  Administered 2023-01-10: 25 mg via ORAL
  Filled 2023-01-10: qty 1

## 2023-01-10 MED ORDER — FAMOTIDINE 20 MG PO TABS
20.0000 mg | ORAL_TABLET | Freq: Once | ORAL | Status: AC
  Administered 2023-01-10: 20 mg via ORAL
  Filled 2023-01-10: qty 1

## 2023-01-10 MED ORDER — ACETAMINOPHEN 325 MG PO TABS
650.0000 mg | ORAL_TABLET | Freq: Once | ORAL | Status: AC
  Administered 2023-01-10: 650 mg via ORAL
  Filled 2023-01-10: qty 2

## 2023-01-10 MED ORDER — DARATUMUMAB-HYALURONIDASE-FIHJ 1800-30000 MG-UT/15ML ~~LOC~~ SOLN
1800.0000 mg | Freq: Once | SUBCUTANEOUS | Status: AC
  Administered 2023-01-10: 1800 mg via SUBCUTANEOUS
  Filled 2023-01-10: qty 15

## 2023-01-10 MED ORDER — DEXAMETHASONE 4 MG PO TABS
8.0000 mg | ORAL_TABLET | Freq: Once | ORAL | Status: AC
  Administered 2023-01-10: 8 mg via ORAL
  Filled 2023-01-10: qty 2

## 2023-01-10 MED ORDER — DEXAMETHASONE 4 MG PO TABS: 8.0000 mg | ORAL_TABLET | Freq: Once | ORAL | Status: DC

## 2023-01-10 NOTE — Progress Notes (Signed)
Ok to treat using outside labs from this morning per Dr. Candise Che.

## 2023-01-10 NOTE — Patient Instructions (Signed)
[facility]  Discharge Instructions: Thank you for choosing [facility] to provide your oncology and hematology care.   If you have a lab appointment with the Cancer Center, please go directly to the Cancer Center and check in at the registration area.   Wear comfortable clothing and clothing appropriate for easy access to any Portacath or PICC line.   We strive to give you quality time with your provider. You may need to reschedule your appointment if you arrive late (15 or more minutes).  Arriving late affects you and other patients whose appointments are after yours.  Also, if you miss three or more appointments without notifying the office, you may be dismissed from the clinic at the provider's discretion.      For prescription refill requests, have your pharmacy contact our office and allow 72 hours for refills to be completed.    Today you received the following chemotherapy and/or immunotherapy agents : Darzalex Faspro      To help prevent nausea and vomiting after your treatment, we encourage you to take your nausea medication as directed.  BELOW ARE SYMPTOMS THAT SHOULD BE REPORTED IMMEDIATELY: *FEVER GREATER THAN 100.4 F (38 C) OR HIGHER *CHILLS OR SWEATING *NAUSEA AND VOMITING THAT IS NOT CONTROLLED WITH YOUR NAUSEA MEDICATION *UNUSUAL SHORTNESS OF BREATH *UNUSUAL BRUISING OR BLEEDING *URINARY PROBLEMS (pain or burning when urinating, or frequent urination) *BOWEL PROBLEMS (unusual diarrhea, constipation, pain near the anus) TENDERNESS IN MOUTH AND THROAT WITH OR WITHOUT PRESENCE OF ULCERS (sore throat, sores in mouth, or a toothache) UNUSUAL RASH, SWELLING OR PAIN  UNUSUAL VAGINAL DISCHARGE OR ITCHING   Items with * indicate a potential emergency and should be followed up as soon as possible or go to the Emergency Department if any problems should occur.  Please show the CHEMOTHERAPY ALERT CARD or IMMUNOTHERAPY ALERT CARD  at check-in to the Emergency Department and triage nurse.  Should you have questions after your visit or need to cancel or reschedule your appointment, please contact [facility]  Dept: [phone]  and follow the prompts.  Office hours are 8:00 a.m. to 4:30 p.m. Monday - Friday. Please note that voicemails left after 4:00 p.m. may not be returned until the following business day.  We are closed weekends and major holidays. You have access to a nurse at all times for urgent questions. Please call the main number to the clinic Dept: [phone] and follow the prompts.   For any non-urgent questions, you may also contact your provider using MyChart. We now offer e-Visits for anyone 18 and older to request care online for non-urgent symptoms. For details visit [email or web address].   Also download the MyChart app! Go to the app store, search "MyChart", open the app, select [facility], and log in with your MyChart username and password.  Masks are optional in the cancer centers. If you would like for your care team to wear a mask while they are taking care of you, please let them know. You may have one support person who is at least 69 years old accompany you for your appointments. 

## 2023-01-16 ENCOUNTER — Other Ambulatory Visit: Payer: Self-pay

## 2023-01-17 ENCOUNTER — Other Ambulatory Visit: Payer: Self-pay

## 2023-01-17 DIAGNOSIS — C9001 Multiple myeloma in remission: Secondary | ICD-10-CM

## 2023-01-17 MED ORDER — LENALIDOMIDE 10 MG PO CAPS
ORAL_CAPSULE | ORAL | 0 refills | Status: DC
Start: 2023-01-17 — End: 2023-02-15

## 2023-01-23 ENCOUNTER — Other Ambulatory Visit: Payer: Self-pay

## 2023-01-23 DIAGNOSIS — C9001 Multiple myeloma in remission: Secondary | ICD-10-CM

## 2023-01-24 ENCOUNTER — Inpatient Hospital Stay: Payer: Medicare Other | Attending: Hematology | Admitting: Hematology

## 2023-01-24 ENCOUNTER — Other Ambulatory Visit: Payer: Medicare Other

## 2023-01-24 ENCOUNTER — Inpatient Hospital Stay: Payer: Medicare Other

## 2023-01-24 ENCOUNTER — Ambulatory Visit: Payer: Medicare Other

## 2023-01-24 ENCOUNTER — Ambulatory Visit: Payer: Medicare Other | Admitting: Physician Assistant

## 2023-01-24 VITALS — BP 107/65 | HR 84 | Temp 98.0°F | Resp 18 | Wt 132.2 lb

## 2023-01-24 DIAGNOSIS — Z79899 Other long term (current) drug therapy: Secondary | ICD-10-CM | POA: Insufficient documentation

## 2023-01-24 DIAGNOSIS — C9001 Multiple myeloma in remission: Secondary | ICD-10-CM | POA: Diagnosis present

## 2023-01-24 DIAGNOSIS — Z5112 Encounter for antineoplastic immunotherapy: Secondary | ICD-10-CM | POA: Insufficient documentation

## 2023-01-24 LAB — CMP (CANCER CENTER ONLY)
ALT: 36 U/L (ref 0–44)
AST: 26 U/L (ref 15–41)
Albumin: 4.4 g/dL (ref 3.5–5.0)
Alkaline Phosphatase: 74 U/L (ref 38–126)
Anion gap: 6 (ref 5–15)
BUN: 20 mg/dL (ref 8–23)
CO2: 28 mmol/L (ref 22–32)
Calcium: 9.2 mg/dL (ref 8.9–10.3)
Chloride: 106 mmol/L (ref 98–111)
Creatinine: 0.81 mg/dL (ref 0.44–1.00)
GFR, Estimated: >60 mL/min (ref >60)
Glucose, Bld: 109 mg/dL — ABNORMAL HIGH (ref 70–99)
Potassium: 4 mmol/L (ref 3.5–5.1)
Sodium: 140 mmol/L (ref 135–145)
Total Bilirubin: 1 mg/dL (ref 0.3–1.2)
Total Protein: 6.9 g/dL (ref 6.5–8.1)

## 2023-01-24 LAB — CBC WITH DIFFERENTIAL (CANCER CENTER ONLY)
Abs Immature Granulocytes: 0.03 10*3/uL (ref 0.00–0.07)
Basophils Absolute: 0 10*3/uL (ref 0.0–0.1)
Basophils Relative: 1 %
Eosinophils Absolute: 0 10*3/uL (ref 0.0–0.5)
Eosinophils Relative: 1 %
HCT: 42.1 % (ref 36.0–46.0)
Hemoglobin: 14.3 g/dL (ref 12.0–15.0)
Immature Granulocytes: 1 %
Lymphocytes Relative: 6 %
Lymphs Abs: 0.2 10*3/uL — ABNORMAL LOW (ref 0.7–4.0)
MCH: 32 pg (ref 26.0–34.0)
MCHC: 34 g/dL (ref 30.0–36.0)
MCV: 94.2 fL (ref 80.0–100.0)
Monocytes Absolute: 0.1 10*3/uL (ref 0.1–1.0)
Monocytes Relative: 3 %
Neutro Abs: 3.7 10*3/uL (ref 1.7–7.7)
Neutrophils Relative %: 88 %
Platelet Count: 272 10*3/uL (ref 150–400)
RBC: 4.47 MIL/uL (ref 3.87–5.11)
RDW: 13.9 % (ref 11.5–15.5)
WBC Count: 4.1 10*3/uL (ref 4.0–10.5)
nRBC: 0 % (ref 0.0–0.2)

## 2023-01-24 MED ORDER — DIPHENHYDRAMINE HCL 25 MG PO CAPS
25.0000 mg | ORAL_CAPSULE | Freq: Once | ORAL | Status: AC
  Administered 2023-01-24: 25 mg via ORAL
  Filled 2023-01-24: qty 1

## 2023-01-24 MED ORDER — ACETAMINOPHEN 325 MG PO TABS
650.0000 mg | ORAL_TABLET | Freq: Once | ORAL | Status: AC
  Administered 2023-01-24: 650 mg via ORAL
  Filled 2023-01-24: qty 2

## 2023-01-24 MED ORDER — FAMOTIDINE 20 MG PO TABS
20.0000 mg | ORAL_TABLET | Freq: Once | ORAL | Status: AC
  Administered 2023-01-24: 20 mg via ORAL
  Filled 2023-01-24: qty 1

## 2023-01-24 MED ORDER — DARATUMUMAB-HYALURONIDASE-FIHJ 1800-30000 MG-UT/15ML ~~LOC~~ SOLN
1800.0000 mg | Freq: Once | SUBCUTANEOUS | Status: AC
  Administered 2023-01-24: 1800 mg via SUBCUTANEOUS
  Filled 2023-01-24: qty 15

## 2023-01-24 NOTE — Progress Notes (Signed)
Patient states she took her dexamethasone around 630 this morning.

## 2023-01-24 NOTE — Progress Notes (Signed)
HEMATOLOGY/ONCOLOGY CLINIC NOTE  Date of Service: 01/24/23   PCP: Paula Joe, MD Transplant Oncologist -- Dr Paula Candle MD  CHIEF COMPLAINT: Follow-up for continued evaluation and management of multiple myeloma  DIAGNOSIS: IgA lambda R-ISS stage II multiple myeloma with innumerable lytic lesions in the calvarium, lesion in T7 vertebra and multiple lesions in the pelvis.  Diagnosed in January 2017.   Current treatment: Revlimid 10mg  po daily 3 weeks on 1 week off (after treatment interruption) Daratumumab added 11/15 for loss of MRD negative status   Previous Treatment: Status post Vd x 1 cycle VRd x 6 cycles   HD Melphalan 200mg /m2 on 03/22/2016 Autologous HSCT on 03/23/2016 with Dr Paula Johnson at [facility] (Dose of 6.4 x10^6/kg)   INTERVAL HISTORY:   Paula Johnson is a 67 y.o. female is here for continued evaluation and management of multiple myeloma and Daratumumab cycle 7 day 1.   Patient was last seen by me on 11/29/2022 and complained of pruritus in her bruised left eye and sleeping difficulties.   Patient was most recently seen by [facility] on 12/27/2022 and was doing well overall with no new medical concerns.  Today, she confirms that she recently had a bone marrow biopsy. She denies any toxicities with her Daratumumab or Revlimid treatment. She denies any infection issues or new symptoms.  She complains of restlessness that causes sleep disturbances. She is unsure if CPAP improves her symptoms and denies any SOB. She notes if she sleeps while sitting, she awakens gasping for air, but denies needing to gasp for air when lying down.  Patient reports that she has recently started 1500 MG Glycine to improve relaxation and sleeping habits. Patient has taken Melatonin previously which did not improve sleeping habits.   REVIEW OF SYSTEMS:    10 Point review of Systems was done is negative except as noted above.   PHYSICAL EXAMINATION: .LMP 09/26/1995 (Within Months)    .BP 107/65   Pulse 84   Temp 98 F (36.7 C)   Resp 18   Wt 132 lb 3.2 oz (60 kg)   LMP 09/26/1995 (Within Months)   SpO2 97%   BMI 20.10 kg/m   GENERAL:alert, in no acute distress and comfortable SKIN: no acute rashes, no significant lesions EYES: conjunctiva are pink and non-injected, sclera anicteric OROPHARYNX: MMM, no exudates, no oropharyngeal erythema or ulceration NECK: supple, no JVD LYMPH:  no palpable lymphadenopathy in the cervical, axillary or inguinal regions LUNGS: clear to auscultation b/l with normal respiratory effort HEART: regular rate & rhythm ABDOMEN:  normoactive bowel sounds , non tender, not distended. Extremity: no pedal edema PSYCH: alert & oriented x 3 with fluent speech NEURO: no focal motor/sensory deficits   MEDICAL HISTORY:   Past Medical History:  Diagnosis Date   Abnormal Pap smear of vagina 03/22/2017   ASCUS pap and negative HR HPV.  Patient is on immunosuppressive medication.    Allergic reaction to bee sting    Anemia    AR (aortic regurgitation) 11/30/2017   trace noted on ECHO   Atrial septal aneurysm per 11-30-17 echo   trivial pericardial effusion   Complicated migraine    Constipation    DDD (degenerative disc disease), cervical    DDD (degenerative disc disease), lumbosacral    Fatty liver    Female proctocele without uterine prolapse    Fibroid    reason for Hysterectomy   FTT (failure to thrive) in adult 08/09/2017   Gastroesophageal reflux disease without esophagitis 01/17/2017  Generalized anxiety disorder    Grade I diastolic dysfunction    Hammer toe 05/13/2018   Hematuria 11/08/2015   History of COVID-19 12/2020   History of stress incontinence    Hypergammaglobulinemia    Hyperlipidemia    Hypoxemia 12/16/2007   Insomnia    Leg length discrepancy    Loss of appetite    Lung nodule    Malignant neoplasm of thymus (HCC) 12/03/2007   Metastatic multiple myeloma to bone (HCC) 10/19/2015   Metatarsalgia of  right foot 07/15/2018   MR (mitral regurgitation) 11/30/2017   trace noted on ECHO   Muscular fasciculation    Nasal septal deviation    Nasal turbinate hypertrophy    Near syncope    Obstructive sleep apnea 12/03/2007   Other fatigue    Peripheral edema    Plasma cell dyscrasia    PONV (postoperative nausea and vomiting)    thinks morphine caused nausea   Postoperative urinary retention 05/14/2018   Prediabetes    Pure hypercholesterolemia    Rash 11/01/2015   Restless leg syndrome    S/P autologous bone marrow transplantation (HCC) 03/29/2016   Shortness of breath 12/04/2007   TR (tricuspid regurgitation) 11/30/2017   trace noted on ECHO   Urinary incontinence    UTI (urinary tract infection)    Vaginal granulation tissue 12/03/2017   Vitamin D deficiency    Wears glasses     SURGICAL HISTORY: Past Surgical History:  Procedure Laterality Date   ABDOMINAL HYSTERECTOMY  1997   TAH--ovaries remain--Dr. Tracey Johnson   ANTERIOR AND POSTERIOR REPAIR N/A 05/14/2018   Procedure: ANTERIOR (CYSTOCELE) AND POSTERIOR REPAIR (RECTOCELE);  Surgeon: Paula Salles, MD;  Location: [facility];  Service: Gynecology;  Laterality: N/A;   BLADDER SUSPENSION N/A 05/14/2018   Procedure: TRANSVAGINAL TAPE (TVT) PROCEDURE exact midurethral sling;  Surgeon: Paula Salles, MD;  Location: [facility];  Service: Gynecology;  Laterality: N/A;   BONE MARROW TRANSPLANT  2017   done at [facility]   BREAST BIOPSY     COLONOSCOPY     CYSTOSCOPY N/A 05/14/2018   Procedure: CYSTOSCOPY;  Surgeon: Paula Salles, MD;  Location: [facility];  Service: Gynecology;  Laterality: N/A;   CYSTOSCOPY N/A 05/30/2018   Procedure: CYSTOSCOPY;  Surgeon: Paula Salles, MD;  Location: [facility];  Service: Gynecology;  Laterality: N/A;   ROBOTIC ASSISTED LAPAROSCOPIC SACROCOLPOPEXY N/A 05/14/2018   Procedure: XI ROBOTIC ASSISTED LAPAROSCOPIC SACROCOLPOPEXY;  Surgeon: Paula Salles, MD;  Location: [facility];  Service: Gynecology;  Laterality: N/A;  6 hours OR time. Need extended stay recovery bed.   SALPINGOOPHORECTOMY Bilateral 05/14/2018   Procedure: SALPINGO OOPHORECTOMY;  Surgeon: Paula Salles, MD;  Location: [facility];  Service: Gynecology;  Laterality: Bilateral;   THYMECTOMY  1999   TRANSVAGINAL TAPE (TVT) REMOVAL N/A 05/30/2018   Procedure: TRANSVAGINAL TAPE (TVT)  Revision;  Surgeon: Paula Salles, MD;  Location: [facility];  Service: Gynecology;  Laterality: N/A;   WISDOM TOOTH EXTRACTION      SOCIAL HISTORY: Social History   Socioeconomic History   Marital status: Married    Spouse name: Leonette Most   Number of children: 2   Years of education: 16   Highest education level: Bachelor's degree (e.g., BA, AB, BS)  Occupational History   Occupation: Retired    Comment: Engineer, 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  Tobacco Use  Smoking status: Never   Smokeless tobacco: Never  Vaping Use   Vaping Use: Never used  Substance and Sexual Activity   Alcohol use: Not Currently    Alcohol/week: 0.0 standard drinks of alcohol   Drug use: Never   Sexual activity: Not Currently    Partners: Male    Birth control/protection: Surgical    Comment: TAH--ovaries remain  Other Topics Concern   Not on file  Social History Narrative   Lives with husband   Caffeine- coffee, 2 cups daily   Right handed   Social Determinants of Health   Financial Resource Strain: Not on file  Food Insecurity: Not on file  Transportation Needs: Not on file  Physical Activity: Not on file  Stress: Not on file  Social Connections: Not on file  Intimate Partner Violence: Not on file    FAMILY HISTORY: Family History  Problem Relation Age of Onset   Hyperlipidemia Mother    Memory loss Mother        50s; believed to be age-related memory decline   COPD Father        dec age 70/s-smoked   Macular degeneration Father    Behavior problems Father         at the end of life   Diabetes Brother    Macular degeneration Brother    Suicidality Brother    Diabetes Maternal Grandmother    Heart attack Paternal Grandfather     ALLERGIES:  is allergic to ampicillin and penicillins.  MEDICATIONS:  . Current Outpatient Medications:    acyclovir (ZOVIRAX) 400 MG tablet, Take 1 tablet (400 mg total) by mouth 2 (two) times daily., Disp: 60 tablet, Rfl: 11   Ascorbic Acid (VITAMIN C) 1000 MG tablet, Take 1,000 mg by mouth 2 (two) times daily., Disp: , Rfl:    aspirin EC 81 MG tablet, Take 81 mg by mouth daily., Disp: , Rfl:    b complex vitamins capsule, Take 1 capsule by mouth daily., Disp: , Rfl:    Cholecalciferol (VITAMIN D3) 30 MCG/15ML LIQD, Take by mouth. Pt. Takes 5000 IU daily, Disp: , Rfl:    Cyanocobalamin (B-12) 1000 MCG CAPS, , Disp: , Rfl:    dexamethasone (DECADRON) 4 MG tablet, Take 2 tablets (8 mg) the day of chemo treatment. (every 14 days), Disp: 20 tablet, Rfl: 2   EPINEPHrine 0.3 mg/0.3 mL IJ SOAJ injection, INJECT INTRAMUSCULARLLY AS DIRECTED AS NEEDED FOR ANAPHYLAXIS, Disp: , Rfl:    estradiol (ESTRACE) 0.1 MG/GM vaginal cream, INSERT ONEGRAM VAGINALLY TWICE A WEEK AT BEDTIME, Disp: 43 g, Rfl: 1   GLYCINE PO, Take 500 mg by mouth daily., Disp: , Rfl:    lenalidomide (REVLIMID) 10 MG capsule, TAKE ONE CAPSULE BY MOUTH DAILY FOR 21 DAYS, THEN 7 DAYS OFF, Disp: 21 capsule, Rfl: 0   LORazepam (ATIVAN) 0.5 MG tablet, Take 1 tablet (0.5 mg total) by mouth at bedtime as needed for sleep or anxiety., Disp: 30 tablet, Rfl: 0   Magnesium 400 MG CAPS, Take by mouth., Disp: , Rfl:    ondansetron (ZOFRAN) 8 MG tablet, Take 1 tablet (8 mg total) by mouth every 8 (eight) hours as needed for nausea or vomiting., Disp: 30 tablet, Rfl: 1   prochlorperazine (COMPAZINE) 10 MG tablet, Take 1 tablet (10 mg total) by mouth every 6 (six) hours as needed for nausea or vomiting., Disp: 30 tablet, Rfl: 1   LABORATORY DATA:  I have reviewed the data  as listed  Latest Ref Rng & Units 12/27/2022   12:40 PM 12/13/2022   12:45 PM 11/29/2022   11:51 AM  CBC  WBC 4.0 - 10.5 K/uL 4.3  7.2  4.1   Hemoglobin 12.0 - 15.0 g/dL 40.9  81.1  91.4   Hematocrit 36.0 - 46.0 % 39.7  40.4  39.1   Platelets 150 - 400 K/uL 268  297  283       Latest Ref Rng & Units 12/13/2022   12:45 PM 11/29/2022   11:51 AM 11/15/2022   12:50 PM  CMP  Glucose 70 - 99 mg/dL 782  956  213   BUN 8 - 23 mg/dL 17  18  19    Creatinine 0.44 - 1.00 mg/dL 0.86  5.78  4.69   Sodium 135 - 145 mmol/L 138  140  139   Potassium 3.5 - 5.1 mmol/L 4.5  4.1  4.5   Chloride 98 - 111 mmol/L 103  106  104   CO2 22 - 32 mmol/L 27  27  28    Calcium 8.9 - 10.3 mg/dL 62.9  9.7  9.2   Total Protein 6.5 - 8.1 g/dL 7.0  7.1  6.7   Total Bilirubin 0.3 - 1.2 mg/dL 0.7  1.1  0.8   Alkaline Phos 38 - 126 U/L 75  72  75   AST 15 - 41 U/L 19  26  21    ALT 0 - 44 U/L 25  28  23       01/10/2023 Bone Marrow Biopsy:    10/10/2019 Flow Cytometry (Repeat BM Bx from [facility]):   10/07/18 Repeat BM Bx from [facility]:   Surgical Pathology                                Case: [id number]                               Authorizing Provider:  Rodney Langton, NP    Collected:           07/04/2022 1015             Ordering Location:     [facility] and   Received:            07/04/2022 1048                                    [facility]                                                   Pathologist:           Delilah Shan, MD                                                   Specimens:   A) - Bone Marrow, Aspirate  B) - Bone Marrow, Biopsy                                                                          C) - Bone Marrow, Clot                                                                 DIAGNOSIS   A-C. Peripheral blood and bone marrow (peripheral smear, aspirate, touch preparation, core biopsy,  and clot section): - Low-level plasma cell neoplasm, see comment.   COMMENT: The core biopsy is 40% cellular. Cyclin-D1 immunohistochemical stain demonstrates very rare positive plasma cells (1%). Concurrent flow cytometry study ([id number]) shows a plasma cell population with an atypical immunophenotype, comprising approximately 0.04% of analyzed events.    Reviewed by resident/fellow Zachery Conch  Electronically signed by Delilah Shan, MD on 07/10/2022 at 12:25 PM  Clinical Information   67 year old female with history of plasma cell myeloma.  Gross Examination   A. Peripheral blood and bone marrow labeled with patient's name and medical record number, one peripheral blood smear, three bone marrow aspirate smears and one bone marrow touch preparation are received.   B. "Bone marrow biopsy", in AZF.  Core biopsies of red-brown tissue 1.4 x 0.2 cm in aggregate are submitted in total as block B1 for decalcification.   C. "Bone marrow clot", in AZF.  A 1.8 x 1.8 x 0.3 cm aggregate of red-brown clot is submitted in total as block C1.    J.Elam/ Dr. Alvera Singh   Microscopic Examination        Lab Results  Component Value Date    WBC 5.2 07/04/2022    HGB 12.1 07/04/2022    PLT 364 07/04/2022    MCV 95 07/04/2022    RDWCV 14.6 (H) 07/04/2022    Peripheral blood and automated blood count:  RBCs: The red blood cells are adequate in number,  normocytic and normochromic, with minimal anisopoikilocytosis. WBCs: The white blood cells are adequate in number and show unremarkable morphology. Platelets: The platelets are adequate in number and show unremarkable morphology.   Bone marrow aspirate:  There are adequate cellular particles. M:E ratio is 3:1.  Granulopoiesis: Maturing myeloid elements are present and show complete maturation. Erythropoiesis:  Erythroid elements are present and show complete maturation. Megakaryocytes: Megakaryocytes are adequate in number and show  unremarkable morphology.  There are slightly increased plasma cells with unremarkable morphology.   Bone marrow touch preparation: Adequate marrow elements are present, without additional findings to the aspirate smears.   Prussian blue stained aspirate smear: 0 of 4 stainable iron is present. No ringed sideroblasts are identified.   Bone marrow core biopsy:  The core biopsy is adequate for evaluation. The cellularity is 40%. Maturing myeloid elements are present and show complete maturation. Maturing erythroid elements are present and show complete maturation.  Megakaryocytes are adequate in number and show unremarkable morphology. There are slightly increased plasma cells.   A reticulin stain of the core biopsy shows no  significant reticulin fibrosis.   Aspirate clot section: There are adequate particles present, without additional findings to the core biopsy.   Immunohistochemical stains for Cyclin-D1, CD138, Kappa CISH, and Lambda CISH are performed on the core biopsy section (block B1). CD138, kappa, and lambda in-situ hybridization demonstrates 5-10% predominantly polytypic plasma cells. Cyclin-D1 demonstrates very rare positive plasma cells (1%).  Bone Marrow Differential   500 CELL DIFF   Cell Type Cell Count Reference Range Cell Type Cell Count Reference Range Cell Type Cell Count Reference Range  Segs 23 (7-25) Lymphocytes 15 (3-20) Plasma Cells 8 (0-3.5)  Bands 3 (6-36)     Atypicals 0   RBC Precursors 22 (10-30)  Metas 2 (9-25) Lymphoblasts *   Pronormoblasts 0 (0-3)  Myelos 5 (8-15) Eosinophils 12 (0-4) M:E Ratio 3/1    Promyelos 1 (1-6) Basophils 0 (0-1) Iron 0 (Scale 0-4+)  Myeloblasts 1 (0-3.5) Monocytes 8 (0-2)                                                                                                     Performed by:    Maryclare Bean                                                                                                         July 04, 2022  12:21 PM  Additional Documentation   All immunohistochemistry, in situ hybridization tests and special stains performed at [facility] and reported herein were developed, validated and their performance characteristics determined by the [facility]. During the performance of these tests, appropriate positive and negative control slides are also performed and reviewed. All control slides and internal controls (when applicable) demonstrate the expected immunoreactive patterns and/or nucleic acid hybridization. These ancillary studies were deemed medically necessary by the requesting pathologist. They were ordered following review of the H&E and clinical history except when part of a liver/kidney protocol or where clinical history (e.g. immunocompromised, critically ill, history of malignancy) clearly indicates. Some of the tests may not be cleared or approved by the U.S. Food and Drug Administration (FDA).  The FDA has determined that such clearance or approval is not necessary.  These tests are used for clinical purposes and should not be regarded as investigational or as research.  This laboratory is certified under the Clinical Laboratory Improvement Amendments of 1988 (CLIA) as qualified to perform high complexity clinical testing. At least one block in this case underwent decalcification during its processing. The vast majority of immunohistochemical and hybridization assays were not validated on decalcified tissues. The results were interpreted with caution given the possibility of false negative results on decalcified specimens.  Attestation   All of the diagnostic evaluations on the enumerated specimens have been personally conducted by the pathologists involved in the care of this patient as indicated by the electronic signatures above.  Resulting Agency [facility] SURGICAL PATHOLOGY AND CYTOPATHOLOGY   Specimen Collected: 07/04/22 10:15   Performed by: [facility] SURGICAL  PATHOLOGY AND CYTOPATHOLOGY Last Resulted: 07/10/22 12:25  Received From: [facility]  Result Received: 07/14/22 09:58   View Encounter      Received Information Pathology - Bone Marrow (Order [id number])    suggestion  Information displayed in this report may not trend or trigger automated decision support.    Pathology - Bone Marrow Order: [id number] Component 4 wk ago  Case Report Surgical Pathology                                Case: [id number]                               Authorizing Provider:  Rodney Langton, NP    Collected:           07/04/2022 1015             Ordering Location:     [facility]   Received:            07/04/2022 1048                                    [facility]                                                   Pathologist:           Delilah Shan, MD                                                   Specimens:   A) - Bone Marrow, Aspirate                                                                        B) - Bone Marrow, Biopsy                                                                          C) - Bone Marrow, Clot  DIAGNOSIS   A-C. Peripheral blood and bone marrow (peripheral smear, aspirate, touch preparation, core biopsy, and clot section): - Low-level plasma cell neoplasm, see comment.   COMMENT: The core biopsy is 40% cellular. Cyclin-D1 immunohistochemical stain demonstrates very rare positive plasma cells (1%). Concurrent flow cytometry study ([id number]) shows a plasma cell population with an atypical immunophenotype, comprising approximately 0.04% of analyzed events.    Reviewed by resident/fellow Zachery Conch  Electronically signed by Delilah Shan, MD on 07/10/2022 at 12:25 PM  Clinical Information   67 year old female with history of plasma cell myeloma.  Gross Examination   A. Peripheral blood and bone  marrow labeled with patient's name and medical record number, one peripheral blood smear, three bone marrow aspirate smears and one bone marrow touch preparation are received.   B. "Bone marrow biopsy", in AZF.  Core biopsies of red-brown tissue 1.4 x 0.2 cm in aggregate are submitted in total as block B1 for decalcification.   C. "Bone marrow clot", in AZF.  A 1.8 x 1.8 x 0.3 cm aggregate of red-brown clot is submitted in total as block C1.    J.Elam/ Dr. Alvera Singh     RADIOGRAPHIC STUDIES: I have personally reviewed the radiological images as listed and agreed with the findings in the report. ECHOCARDIOGRAM COMPLETE  Result Date: 01/08/2023    ECHOCARDIOGRAM REPORT   Patient Name:   MYNDEE CREQUE Date of Exam: 01/08/2023 Medical Rec #:  [id number]      Height:       68.0 in Accession #:    [id number]     Weight:       131.6 lb Date of Birth:  03-07-1956      BSA:          1.711 m Patient Age:    66 years       BP:           112/88 mmHg Patient Gender: F              HR:           68 bpm. Exam Location:  [address] Procedure: 2D Echo, 3D Echo, Cardiac Doppler, Color Doppler and Strain Analysis Indications:    I35.9 Aortic Valve Disorder  History:        Patient has prior history of Echocardiogram examinations, most                 recent 12/12/2019. Aortic Valve Disease, Signs/Symptoms:Shortness                 of Breath, Murmur, Syncope, Edema and Fatigue; Risk                 Factors:Family History of Coronary Artery Disease, Dyslipidemia                 and Sleep Apnea. Aortic Valve Insufficiency.  Sonographer:    Farrel Conners RDCS Referring Phys: Madolyn Frieze CRENSHAW IMPRESSIONS  1. Left ventricular ejection fraction, by estimation, is 60 to 65%. The left ventricle has normal function. The left ventricle has no regional wall motion abnormalities. Left ventricular diastolic parameters are indeterminate. The average left ventricular global longitudinal strain is -24.1 %. The global longitudinal strain  is normal.  2. Right ventricular systolic function is normal. The right ventricular size is normal.  3. The mitral valve is normal in structure. No evidence of mitral valve regurgitation. No evidence of mitral stenosis.  4. The aortic valve is tricuspid. There is  mild calcification of the aortic valve. There is mild thickening of the aortic valve. Aortic valve regurgitation is mild to moderate. Aortic valve sclerosis is present, with no evidence of aortic valve stenosis.  5. The inferior vena cava is normal in size with greater than 50% respiratory variability, suggesting right atrial pressure of 3 mmHg. FINDINGS  Left Ventricle: Left ventricular ejection fraction, by estimation, is 60 to 65%. The left ventricle has normal function. The left ventricle has no regional wall motion abnormalities. The average left ventricular global longitudinal strain is -24.1 %. The global longitudinal strain is normal. The left ventricular internal cavity size was normal in size. There is no left ventricular hypertrophy. Left ventricular diastolic parameters are indeterminate. Right Ventricle: The right ventricular size is normal. No increase in right ventricular wall thickness. Right ventricular systolic function is normal. Left Atrium: Left atrial size was normal in size. Right Atrium: Right atrial size was normal in size. Pericardium: There is no evidence of pericardial effusion. Mitral Valve: The mitral valve is normal in structure. No evidence of mitral valve regurgitation. No evidence of mitral valve stenosis. Tricuspid Valve: The tricuspid valve is normal in structure. Tricuspid valve regurgitation is trivial. No evidence of tricuspid stenosis. Aortic Valve: The aortic valve is tricuspid. There is mild calcification of the aortic valve. There is mild thickening of the aortic valve. Aortic valve regurgitation is mild to moderate. Aortic regurgitation PHT measures 549 msec. Aortic valve sclerosis  is present, with no evidence of  aortic valve stenosis. Pulmonic Valve: The pulmonic valve was normal in structure. Pulmonic valve regurgitation is not visualized. No evidence of pulmonic stenosis. Aorta: The aortic root is normal in size and structure. Venous: The inferior vena cava is normal in size with greater than 50% respiratory variability, suggesting right atrial pressure of 3 mmHg. IAS/Shunts: No atrial level shunt detected by color flow Doppler.  LEFT VENTRICLE PLAX 2D LVIDd:         3.90 cm   Diastology LVIDs:         2.30 cm   LV e' medial:    6.90 cm/s LV PW:         0.70 cm   LV E/e' medial:  9.7 LV IVS:        1.00 cm   LV e' lateral:   9.90 cm/s LVOT diam:     2.00 cm   LV E/e' lateral: 6.7 LV SV:         73 LV SV Index:   43        2D Longitudinal Strain LVOT Area:     3.14 cm  2D Strain GLS (A2C):   -22.5 %                          2D Strain GLS (A3C):   -24.2 %                          2D Strain GLS (A4C):   -25.5 %                          2D Strain GLS Avg:     -24.1 %                           3D Volume EF:  3D EF:        73 %                          LV EDV:       119 ml                          LV ESV:       33 ml                          LV SV:        87 ml RIGHT VENTRICLE RV Basal diam:  2.90 cm RV S prime:     9.25 cm/s TAPSE (M-mode): 1.4 cm RVSP:           12.0 mmHg LEFT ATRIUM           Index        RIGHT ATRIUM           Index LA diam:      3.20 cm 1.87 cm/m   RA Pressure: 3.00 mmHg LA Vol (A2C): 40.1 ml 23.44 ml/m  RA Area:     10.20 cm LA Vol (A4C): 14.0 ml 8.18 ml/m   RA Volume:   21.30 ml  12.45 ml/m  AORTIC VALVE LVOT Vmax:   116.50 cm/s LVOT Vmean:  72.250 cm/s LVOT VTI:    0.233 m AI PHT:      549 msec  AORTA Ao Root diam: 3.10 cm Ao Asc diam:  2.80 cm MITRAL VALVE               TRICUSPID VALVE MV Area (PHT): cm         TR Peak grad:   9.0 mmHg MV Decel Time: 208 msec    TR Vmax:        150.00 cm/s MV E velocity: 66.80 cm/s  Estimated RAP:  3.00 mmHg MV A velocity: 62.65 cm/s   RVSP:           12.0 mmHg MV E/A ratio:  1.07                            SHUNTS                            Systemic VTI:  0.23 m                            Systemic Diam: 2.00 cm Donato Schultz MD Electronically signed by Donato Schultz MD Signature Date/Time: 01/08/2023/12:03:52 PM    Final     ASSESSMENT & PLAN:   67 y.o. caucasian female with  1. H/o Multiple myeloma - currently in remission.  But bone marrow biopsy shows she has turned MRD positive  2. IgA lambda R-ISS stage II multiple myeloma with innumerable lytic lesions in the calvarium, lesion in T7 vertebra and multiple lesions in the pelvis.  No significant bone pain at this time. Diagnosed in January 2017.  s/p treatment as noted above 10/07/18 BM Bx and Flow cytometry indicate that the pt continues to be in CR 10/10/2019 Flow Cytometry revealed "A. Bone Marrow (Plasma Cell Myeloma Minimal Residual Disease Detection by Flow Cytometry): Negative. No phenotypically abnormal plasma cells at or above the limit of  detection identified."  PLAN:   -patient received a repeat bone marrow biopsy 01/10/2023 -Discussed lab results on 01/24/2023 in detail with patient. CBC normal, showed WBC of 4.1K, hemoglobin of 14.3, and platelets of 272K. -chronic low lymphocytes stable -CMP normal -01/10/2023 MRD 0.002% abnormal plasma cells, improved from 0.04% in 07/04/2022 -No new toxicities from her Daratumumab every 2 weeks and maintenance Revlimid. Will switch Dara to every 4 weeks -continue Dexamethasone 8 MG -Continue acyclovir for VZV prophylaxis -Continue aspirin for VTE prophylaxis. -recommend patient to stay UTD with age-appropriate screenings such as mammogram and colonoscopy -Patient is scheduled to receive mamogram 01/25/2023 -answered questions regarding results of 01/10/2023 bone marrow biopsy -patient will follow-up with sleep study for further evaluation of sleep disturbances  FOLLOW-UP:  Changed Daratumumab faspro to monthly plz adjust  schedule per new orders Per integrated scheduling  The total time spent in the appointment was 30 minutes* .  All of the patient's questions were answered with apparent satisfaction. The patient knows to call the clinic with any problems, questions or concerns.   Wyvonnia Lora MD MS AAHIVMS [facility] Hematology/Oncology Physician [facility]  .*Total Encounter Time as defined by the Centers for Medicare and Medicaid Services includes, in addition to the face-to-face time of a patient visit (documented in the note above) non-face-to-face time: obtaining and reviewing outside history, ordering and reviewing medications, tests or procedures, care coordination (communications with other health care professionals or caregivers) and documentation in the medical record.    I,Mitra Faeizi,acting as a Neurosurgeon for Wyvonnia Lora, MD.,have documented all relevant documentation on the behalf of Wyvonnia Lora, MD,as directed by  Wyvonnia Lora, MD while in the presence of Wyvonnia Lora, MD.  .I have reviewed the above documentation for accuracy and completeness, and I agree with the above. Johney Maine MD

## 2023-01-24 NOTE — Patient Instructions (Signed)
[facility]  Discharge Instructions: Thank you for choosing [facility] to provide your oncology and hematology care.   If you have a lab appointment with the Cancer Center, please go directly to the Cancer Center and check in at the registration area.   Wear comfortable clothing and clothing appropriate for easy access to any Portacath or PICC line.   We strive to give you quality time with your provider. You may need to reschedule your appointment if you arrive late (15 or more minutes).  Arriving late affects you and other patients whose appointments are after yours.  Also, if you miss three or more appointments without notifying the office, you may be dismissed from the clinic at the provider's discretion.      For prescription refill requests, have your pharmacy contact our office and allow 72 hours for refills to be completed.    Today you received the following chemotherapy and/or immunotherapy agents Darzalex Faspro      To help prevent nausea and vomiting after your treatment, we encourage you to take your nausea medication as directed.  BELOW ARE SYMPTOMS THAT SHOULD BE REPORTED IMMEDIATELY: *FEVER GREATER THAN 100.4 F (38 C) OR HIGHER *CHILLS OR SWEATING *NAUSEA AND VOMITING THAT IS NOT CONTROLLED WITH YOUR NAUSEA MEDICATION *UNUSUAL SHORTNESS OF BREATH *UNUSUAL BRUISING OR BLEEDING *URINARY PROBLEMS (pain or burning when urinating, or frequent urination) *BOWEL PROBLEMS (unusual diarrhea, constipation, pain near the anus) TENDERNESS IN MOUTH AND THROAT WITH OR WITHOUT PRESENCE OF ULCERS (sore throat, sores in mouth, or a toothache) UNUSUAL RASH, SWELLING OR PAIN  UNUSUAL VAGINAL DISCHARGE OR ITCHING   Items with * indicate a potential emergency and should be followed up as soon as possible or go to the Emergency Department if any problems should occur.  Please show the CHEMOTHERAPY ALERT CARD or IMMUNOTHERAPY ALERT CARD at  check-in to the Emergency Department and triage nurse.  Should you have questions after your visit or need to cancel or reschedule your appointment, please contact [facility]  Dept: [phone]  and follow the prompts.  Office hours are 8:00 a.m. to 4:30 p.m. Monday - Friday. Please note that voicemails left after 4:00 p.m. may not be returned until the following business day.  We are closed weekends and major holidays. You have access to a nurse at all times for urgent questions. Please call the main number to the clinic Dept: [phone] and follow the prompts.   For any non-urgent questions, you may also contact your provider using MyChart. We now offer e-Visits for anyone 18 and older to request care online for non-urgent symptoms. For details visit [email or web address].   Also download the MyChart app! Go to the app store, search "MyChart", open the app, select [place], and log in with your MyChart username and password.  

## 2023-01-24 NOTE — Progress Notes (Signed)
Patient seen by Dr. Kale  Vitals are within treatment parameters.  Labs reviewed: and are within treatment parameters.  Per physician team, patient is ready for treatment and there are NO modifications to the treatment plan.  

## 2023-01-26 ENCOUNTER — Encounter: Payer: Self-pay | Admitting: Hematology

## 2023-01-26 ENCOUNTER — Encounter: Payer: Self-pay | Admitting: Obstetrics and Gynecology

## 2023-01-29 LAB — MULTIPLE MYELOMA PANEL, SERUM
Albumin SerPl Elph-Mcnc: 4 g/dL (ref 2.9–4.4)
Albumin/Glob SerPl: 1.7 (ref 0.7–1.7)
Alpha 1: 0.2 g/dL (ref 0.0–0.4)
Alpha2 Glob SerPl Elph-Mcnc: 0.7 g/dL (ref 0.4–1.0)
B-Globulin SerPl Elph-Mcnc: 0.9 g/dL (ref 0.7–1.3)
Gamma Glob SerPl Elph-Mcnc: 0.5 g/dL (ref 0.4–1.8)
Globulin, Total: 2.4 g/dL (ref 2.2–3.9)
IgA: 115 mg/dL (ref 87–352)
IgG (Immunoglobin G), Serum: 655 mg/dL (ref 586–1602)
IgM (Immunoglobulin M), Srm: 25 mg/dL — ABNORMAL LOW (ref 26–217)
M Protein SerPl Elph-Mcnc: 0.1 g/dL — ABNORMAL HIGH
Total Protein ELP: 6.4 g/dL (ref 6.0–8.5)

## 2023-01-30 ENCOUNTER — Encounter: Payer: Self-pay | Admitting: Hematology

## 2023-01-31 ENCOUNTER — Telehealth: Payer: Self-pay | Admitting: Hematology

## 2023-02-07 ENCOUNTER — Inpatient Hospital Stay: Payer: Medicare Other

## 2023-02-15 ENCOUNTER — Other Ambulatory Visit: Payer: Self-pay

## 2023-02-15 DIAGNOSIS — C9001 Multiple myeloma in remission: Secondary | ICD-10-CM

## 2023-02-15 MED ORDER — LENALIDOMIDE 10 MG PO CAPS
ORAL_CAPSULE | ORAL | 0 refills | Status: DC
Start: 2023-02-15 — End: 2023-03-15

## 2023-02-21 ENCOUNTER — Inpatient Hospital Stay: Payer: Medicare Other

## 2023-02-21 VITALS — BP 113/69 | HR 79 | Temp 98.2°F | Resp 18 | Ht 68.0 in | Wt 129.8 lb

## 2023-02-21 DIAGNOSIS — C9001 Multiple myeloma in remission: Secondary | ICD-10-CM

## 2023-02-21 DIAGNOSIS — C9000 Multiple myeloma not having achieved remission: Secondary | ICD-10-CM

## 2023-02-21 DIAGNOSIS — Z5112 Encounter for antineoplastic immunotherapy: Secondary | ICD-10-CM | POA: Diagnosis not present

## 2023-02-21 LAB — CBC WITH DIFFERENTIAL (CANCER CENTER ONLY)
Abs Immature Granulocytes: 0.01 10*3/uL (ref 0.00–0.07)
Basophils Absolute: 0 10*3/uL (ref 0.0–0.1)
Basophils Relative: 1 %
Eosinophils Absolute: 0.1 10*3/uL (ref 0.0–0.5)
Eosinophils Relative: 4 %
HCT: 36.4 % (ref 36.0–46.0)
Hemoglobin: 12.3 g/dL (ref 12.0–15.0)
Immature Granulocytes: 0 %
Lymphocytes Relative: 13 %
Lymphs Abs: 0.4 10*3/uL — ABNORMAL LOW (ref 0.7–4.0)
MCH: 31.7 pg (ref 26.0–34.0)
MCHC: 33.8 g/dL (ref 30.0–36.0)
MCV: 93.8 fL (ref 80.0–100.0)
Monocytes Absolute: 0.6 10*3/uL (ref 0.1–1.0)
Monocytes Relative: 17 %
Neutro Abs: 2.2 10*3/uL (ref 1.7–7.7)
Neutrophils Relative %: 65 %
Platelet Count: 255 10*3/uL (ref 150–400)
RBC: 3.88 MIL/uL (ref 3.87–5.11)
RDW: 13.9 % (ref 11.5–15.5)
WBC Count: 3.4 10*3/uL — ABNORMAL LOW (ref 4.0–10.5)
nRBC: 0 % (ref 0.0–0.2)

## 2023-02-21 MED ORDER — FAMOTIDINE 20 MG PO TABS
20.0000 mg | ORAL_TABLET | Freq: Once | ORAL | Status: AC
  Administered 2023-02-21: 20 mg via ORAL
  Filled 2023-02-21: qty 1

## 2023-02-21 MED ORDER — ACETAMINOPHEN 325 MG PO TABS
650.0000 mg | ORAL_TABLET | Freq: Once | ORAL | Status: AC
  Administered 2023-02-21: 650 mg via ORAL
  Filled 2023-02-21: qty 2

## 2023-02-21 MED ORDER — DARATUMUMAB-HYALURONIDASE-FIHJ 1800-30000 MG-UT/15ML ~~LOC~~ SOLN
1800.0000 mg | Freq: Once | SUBCUTANEOUS | Status: AC
  Administered 2023-02-21: 1800 mg via SUBCUTANEOUS
  Filled 2023-02-21: qty 15

## 2023-02-21 MED ORDER — DEXAMETHASONE 4 MG PO TABS
8.0000 mg | ORAL_TABLET | Freq: Once | ORAL | Status: AC
  Administered 2023-02-21: 8 mg via ORAL
  Filled 2023-02-21: qty 2

## 2023-02-21 MED ORDER — DIPHENHYDRAMINE HCL 25 MG PO CAPS
25.0000 mg | ORAL_CAPSULE | Freq: Once | ORAL | Status: AC
  Administered 2023-02-21: 25 mg via ORAL
  Filled 2023-02-21: qty 1

## 2023-02-21 NOTE — Progress Notes (Signed)
Pt states she did not take Decadron prior to appointment time. Verbal order for 8mg  Decadron obtained from Candise Che, MD. Pt verbalized understanding of waiting 1hr after steroids prior to receiving treatment.

## 2023-02-21 NOTE — Patient Instructions (Signed)
[facility]  Discharge Instructions: Thank you for choosing [facility] to provide your oncology and hematology care.   If you have a lab appointment with the Cancer Center, please go directly to the Cancer Center and check in at the registration area.   Wear comfortable clothing and clothing appropriate for easy access to any Portacath or PICC line.   We strive to give you quality time with your provider. You may need to reschedule your appointment if you arrive late (15 or more minutes).  Arriving late affects you and other patients whose appointments are after yours.  Also, if you miss three or more appointments without notifying the office, you may be dismissed from the clinic at the provider's discretion.      For prescription refill requests, have your pharmacy contact our office and allow 72 hours for refills to be completed.    Today you received the following chemotherapy and/or immunotherapy agents Darzalex Faspro      To help prevent nausea and vomiting after your treatment, we encourage you to take your nausea medication as directed.  BELOW ARE SYMPTOMS THAT SHOULD BE REPORTED IMMEDIATELY: *FEVER GREATER THAN 100.4 F (38 C) OR HIGHER *CHILLS OR SWEATING *NAUSEA AND VOMITING THAT IS NOT CONTROLLED WITH YOUR NAUSEA MEDICATION *UNUSUAL SHORTNESS OF BREATH *UNUSUAL BRUISING OR BLEEDING *URINARY PROBLEMS (pain or burning when urinating, or frequent urination) *BOWEL PROBLEMS (unusual diarrhea, constipation, pain near the anus) TENDERNESS IN MOUTH AND THROAT WITH OR WITHOUT PRESENCE OF ULCERS (sore throat, sores in mouth, or a toothache) UNUSUAL RASH, SWELLING OR PAIN  UNUSUAL VAGINAL DISCHARGE OR ITCHING   Items with * indicate a potential emergency and should be followed up as soon as possible or go to the Emergency Department if any problems should occur.  Please show the CHEMOTHERAPY ALERT CARD or IMMUNOTHERAPY ALERT CARD at  check-in to the Emergency Department and triage nurse.  Should you have questions after your visit or need to cancel or reschedule your appointment, please contact [facility]  Dept: [phone]  and follow the prompts.  Office hours are 8:00 a.m. to 4:30 p.m. Monday - Friday. Please note that voicemails left after 4:00 p.m. may not be returned until the following business day.  We are closed weekends and major holidays. You have access to a nurse at all times for urgent questions. Please call the main number to the clinic Dept: [phone] and follow the prompts.   For any non-urgent questions, you may also contact your provider using MyChart. We now offer e-Visits for anyone 18 and older to request care online for non-urgent symptoms. For details visit [email or web address].   Also download the MyChart app! Go to the app store, search "MyChart", open the app, select [facility], and log in with your MyChart username and password.  

## 2023-02-22 ENCOUNTER — Other Ambulatory Visit: Payer: Self-pay

## 2023-02-22 ENCOUNTER — Other Ambulatory Visit: Payer: Self-pay | Admitting: Hematology

## 2023-02-23 MED ORDER — LORAZEPAM 0.5 MG PO TABS: 0.5000 mg | ORAL_TABLET | Freq: Every evening | ORAL | 0 refills | Status: AC | PRN

## 2023-03-01 LAB — MULTIPLE MYELOMA PANEL, SERUM
Albumin SerPl Elph-Mcnc: 3.3 g/dL (ref 2.9–4.4)
Albumin/Glob SerPl: 1.6 (ref 0.7–1.7)
Alpha 1: 0.2 g/dL (ref 0.0–0.4)
Alpha2 Glob SerPl Elph-Mcnc: 0.7 g/dL (ref 0.4–1.0)
B-Globulin SerPl Elph-Mcnc: 0.9 g/dL (ref 0.7–1.3)
Gamma Glob SerPl Elph-Mcnc: 0.5 g/dL (ref 0.4–1.8)
Globulin, Total: 2.2 g/dL (ref 2.2–3.9)
IgA: 105 mg/dL (ref 87–352)
IgG (Immunoglobin G), Serum: 586 mg/dL (ref 586–1602)
IgM (Immunoglobulin M), Srm: 22 mg/dL — ABNORMAL LOW (ref 26–217)
M Protein SerPl Elph-Mcnc: 0.1 g/dL — ABNORMAL HIGH
Total Protein ELP: 5.5 g/dL — ABNORMAL LOW (ref 6.0–8.5)

## 2023-03-04 ENCOUNTER — Other Ambulatory Visit: Payer: Self-pay | Admitting: Obstetrics and Gynecology

## 2023-03-04 DIAGNOSIS — N952 Postmenopausal atrophic vaginitis: Secondary | ICD-10-CM

## 2023-03-05 NOTE — Telephone Encounter (Signed)
Med refill request: estrace Last AEX: 03/08/22 Next AEX:04/11/23 Last MMG (if hormonal med) 01/25/23 Refill authorized: Please Advise, #[phone], 0 RF

## 2023-03-07 ENCOUNTER — Encounter: Payer: Self-pay | Admitting: Obstetrics and Gynecology

## 2023-03-15 ENCOUNTER — Other Ambulatory Visit: Payer: Self-pay

## 2023-03-15 DIAGNOSIS — C9001 Multiple myeloma in remission: Secondary | ICD-10-CM

## 2023-03-15 MED ORDER — LENALIDOMIDE 10 MG PO CAPS
ORAL_CAPSULE | ORAL | 0 refills | Status: DC
Start: 2023-03-15 — End: 2023-04-12

## 2023-03-21 ENCOUNTER — Other Ambulatory Visit: Payer: Self-pay

## 2023-03-21 ENCOUNTER — Inpatient Hospital Stay: Payer: Medicare Other

## 2023-03-21 ENCOUNTER — Inpatient Hospital Stay: Payer: Medicare Other | Admitting: Hematology

## 2023-03-21 ENCOUNTER — Inpatient Hospital Stay: Payer: Medicare Other | Attending: Hematology

## 2023-03-21 VITALS — BP 116/71 | HR 74 | Temp 98.2°F | Resp 18 | Wt 129.1 lb

## 2023-03-21 DIAGNOSIS — C9000 Multiple myeloma not having achieved remission: Secondary | ICD-10-CM

## 2023-03-21 DIAGNOSIS — C9001 Multiple myeloma in remission: Secondary | ICD-10-CM

## 2023-03-21 DIAGNOSIS — Z79899 Other long term (current) drug therapy: Secondary | ICD-10-CM | POA: Diagnosis not present

## 2023-03-21 DIAGNOSIS — Z5112 Encounter for antineoplastic immunotherapy: Secondary | ICD-10-CM

## 2023-03-21 LAB — CMP (CANCER CENTER ONLY)
ALT: 21 U/L (ref 0–44)
AST: 16 U/L (ref 15–41)
Albumin: 4 g/dL (ref 3.5–5.0)
Alkaline Phosphatase: 79 U/L (ref 38–126)
Anion gap: 6 (ref 5–15)
BUN: 16 mg/dL (ref 8–23)
CO2: 27 mmol/L (ref 22–32)
Calcium: 10 mg/dL (ref 8.9–10.3)
Chloride: 106 mmol/L (ref 98–111)
Creatinine: 0.72 mg/dL (ref 0.44–1.00)
GFR, Estimated: >60 mL/min (ref >60)
Glucose, Bld: 100 mg/dL — ABNORMAL HIGH (ref 70–99)
Potassium: 4.4 mmol/L (ref 3.5–5.1)
Sodium: 139 mmol/L (ref 135–145)
Total Bilirubin: 0.6 mg/dL (ref 0.3–1.2)
Total Protein: 7.1 g/dL (ref 6.5–8.1)

## 2023-03-21 LAB — CBC WITH DIFFERENTIAL (CANCER CENTER ONLY)
Abs Immature Granulocytes: 0.02 10*3/uL (ref 0.00–0.07)
Basophils Absolute: 0 10*3/uL (ref 0.0–0.1)
Basophils Relative: 1 %
Eosinophils Absolute: 0 10*3/uL (ref 0.0–0.5)
Eosinophils Relative: 1 %
HCT: 41.4 % (ref 36.0–46.0)
Hemoglobin: 13.7 g/dL (ref 12.0–15.0)
Immature Granulocytes: 0 %
Lymphocytes Relative: 5 %
Lymphs Abs: 0.2 10*3/uL — ABNORMAL LOW (ref 0.7–4.0)
MCH: 31.4 pg (ref 26.0–34.0)
MCHC: 33.1 g/dL (ref 30.0–36.0)
MCV: 94.7 fL (ref 80.0–100.0)
Monocytes Absolute: 0.1 10*3/uL (ref 0.1–1.0)
Monocytes Relative: 3 %
Neutro Abs: 4.2 10*3/uL (ref 1.7–7.7)
Neutrophils Relative %: 90 %
Platelet Count: 316 10*3/uL (ref 150–400)
RBC: 4.37 MIL/uL (ref 3.87–5.11)
RDW: 14 % (ref 11.5–15.5)
WBC Count: 4.7 10*3/uL (ref 4.0–10.5)
nRBC: 0 % (ref 0.0–0.2)

## 2023-03-21 MED ORDER — FAMOTIDINE 20 MG PO TABS
20.0000 mg | ORAL_TABLET | Freq: Once | ORAL | Status: AC
  Administered 2023-03-21: 20 mg via ORAL
  Filled 2023-03-21: qty 1

## 2023-03-21 MED ORDER — DARATUMUMAB-HYALURONIDASE-FIHJ 1800-30000 MG-UT/15ML ~~LOC~~ SOLN
1800.0000 mg | Freq: Once | SUBCUTANEOUS | Status: AC
  Administered 2023-03-21: 1800 mg via SUBCUTANEOUS
  Filled 2023-03-21: qty 15

## 2023-03-21 MED ORDER — ACETAMINOPHEN 325 MG PO TABS
650.0000 mg | ORAL_TABLET | Freq: Once | ORAL | Status: AC
  Administered 2023-03-21: 650 mg via ORAL
  Filled 2023-03-21: qty 2

## 2023-03-21 MED ORDER — DIPHENHYDRAMINE HCL 25 MG PO CAPS
25.0000 mg | ORAL_CAPSULE | Freq: Once | ORAL | Status: AC
  Administered 2023-03-21: 25 mg via ORAL
  Filled 2023-03-21: qty 1

## 2023-03-21 NOTE — Progress Notes (Signed)
Pt took dexamethasone at home

## 2023-03-21 NOTE — Progress Notes (Signed)
Patient seen by Dr. Kale  Vitals are within treatment parameters.  Labs reviewed: and are within treatment parameters.  Per physician team, patient is ready for treatment and there are NO modifications to the treatment plan.  

## 2023-03-21 NOTE — Patient Instructions (Signed)
[facility]  Discharge Instructions: Thank you for choosing [facility] to provide your oncology and hematology care.   If you have a lab appointment with the Cancer Center, please go directly to the Cancer Center and check in at the registration area.   Wear comfortable clothing and clothing appropriate for easy access to any Portacath or PICC line.   We strive to give you quality time with your provider. You may need to reschedule your appointment if you arrive late (15 or more minutes).  Arriving late affects you and other patients whose appointments are after yours.  Also, if you miss three or more appointments without notifying the office, you may be dismissed from the clinic at the provider's discretion.      For prescription refill requests, have your pharmacy contact our office and allow 72 hours for refills to be completed.    Today you received the following chemotherapy and/or immunotherapy agents Darzalex Faspro      To help prevent nausea and vomiting after your treatment, we encourage you to take your nausea medication as directed.  BELOW ARE SYMPTOMS THAT SHOULD BE REPORTED IMMEDIATELY: *FEVER GREATER THAN 100.4 F (38 C) OR HIGHER *CHILLS OR SWEATING *NAUSEA AND VOMITING THAT IS NOT CONTROLLED WITH YOUR NAUSEA MEDICATION *UNUSUAL SHORTNESS OF BREATH *UNUSUAL BRUISING OR BLEEDING *URINARY PROBLEMS (pain or burning when urinating, or frequent urination) *BOWEL PROBLEMS (unusual diarrhea, constipation, pain near the anus) TENDERNESS IN MOUTH AND THROAT WITH OR WITHOUT PRESENCE OF ULCERS (sore throat, sores in mouth, or a toothache) UNUSUAL RASH, SWELLING OR PAIN  UNUSUAL VAGINAL DISCHARGE OR ITCHING   Items with * indicate a potential emergency and should be followed up as soon as possible or go to the Emergency Department if any problems should occur.  Please show the CHEMOTHERAPY ALERT CARD or IMMUNOTHERAPY ALERT CARD at  check-in to the Emergency Department and triage nurse.  Should you have questions after your visit or need to cancel or reschedule your appointment, please contact [facility]  Dept: [phone]  and follow the prompts.  Office hours are 8:00 a.m. to 4:30 p.m. Monday - Friday. Please note that voicemails left after 4:00 p.m. may not be returned until the following business day.  We are closed weekends and major holidays. You have access to a nurse at all times for urgent questions. Please call the main number to the clinic Dept: [phone] and follow the prompts.   For any non-urgent questions, you may also contact your provider using MyChart. We now offer e-Visits for anyone 18 and older to request care online for non-urgent symptoms. For details visit [email or web address].   Also download the MyChart app! Go to the app store, search "MyChart", open the app, select [facility], and log in with your MyChart username and password.  

## 2023-03-21 NOTE — Progress Notes (Signed)
HEMATOLOGY/ONCOLOGY CLINIC NOTE  Date of Service: 03/21/23   PCP: Tally Joe, MD Transplant Oncologist -- Dr Eddie Candle MD  CHIEF COMPLAINT: Follow-up for continued evaluation and management of multiple myeloma  DIAGNOSIS: IgA lambda R-ISS stage II multiple myeloma with innumerable lytic lesions in the calvarium, lesion in T7 vertebra and multiple lesions in the pelvis.  Diagnosed in January 2017.   Current treatment: Revlimid 10mg  po daily 3 weeks on 1 week off (after treatment interruption) Daratumumab added 11/15 for loss of MRD negative status   Previous Treatment: Status post Vd x 1 cycle VRd x 6 cycles   HD Melphalan 200mg /m2 on 03/22/2016 Autologous HSCT on 03/23/2016 with Dr Donnie Coffin at [facility] (Dose of 6.4 x10^6/kg)   INTERVAL HISTORY:   Paula Johnson is a 67 y.o. female is here for continued evaluation and management of multiple myeloma and Daratumumab cycle 9 day 1.   Patient was last seen by me on 01/24/2023 and complained of restlessness causing sleep disturbances and gasping for air when sleeping in a sitting position.   Today, she reports that she had a cold about two weeks ago with thick brown phlegm. She did endorse a fever at the time but did not have any sore throat or SOB.   She does endorse a mild cough at this time and notes some mild sinus pain. Patient denies any fever or active respiratory sysmptoms at this time. Patient was tested for COVID-19 and had negative findings. She does not tend to endorse seasonal allergies.  She denies any issues with tolerating Daratumumab or Revlimid. She denies any SOB, chest pain, new abdominal pain, diarrhea, leg swelling, or bone pain.  Patient complains of sleep problems and previous sleep study showed normal results. Blood work at that time showed low ferritin levels of 20 and iron saturation of 18%. She has been taking ferrous sulfate 65 MG once daily since Feb 02, 2023 which she is tolerating well.    Patient reports that at the time that she was diagnosed with sleep apnea, she was also diagnosed with restless leg syndrome. She reports that she needs to move around frequently due to restless leg syndrome symptoms.   Patient was seen by Dr. Barbaraann Boys on 01/30/2023 and was told to continue her current therapy of darzalex every 4 weeks  repeat MRD in 6 months.  REVIEW OF SYSTEMS:    10 Point review of Systems was done is negative except as noted above.   PHYSICAL EXAMINATION: .BP 116/71   Pulse 74   Temp 98.2 F (36.8 C)   Resp 18   Wt 129 lb 1.6 oz (58.6 kg)   LMP 09/26/1995 (Within Months)   SpO2 98%   BMI 19.63 kg/m  GENERAL:alert, in no acute distress and comfortable SKIN: no acute rashes, no significant lesions EYES: conjunctiva are pink and non-injected, sclera anicteric OROPHARYNX: MMM, no exudates, no oropharyngeal erythema or ulceration NECK: supple, no JVD LYMPH:  no palpable lymphadenopathy in the cervical, axillary or inguinal regions LUNGS: clear to auscultation b/l with normal respiratory effort HEART: regular rate & rhythm ABDOMEN:  normoactive bowel sounds , non tender, not distended. Extremity: no pedal edema PSYCH: alert & oriented x 3 with fluent speech NEURO: no focal motor/sensory deficits   MEDICAL HISTORY:   Past Medical History:  Diagnosis Date   Abnormal Pap smear of vagina 03/22/2017   ASCUS pap and negative HR HPV.  Patient is on immunosuppressive medication.    Allergic reaction to bee sting  Anemia    AR (aortic regurgitation) 11/30/2017   trace noted on ECHO   Atrial septal aneurysm per 11-30-17 echo   trivial pericardial effusion   Complicated migraine    Constipation    DDD (degenerative disc disease), cervical    DDD (degenerative disc disease), lumbosacral    Fatty liver    Female proctocele without uterine prolapse    Fibroid    reason for Hysterectomy   FTT (failure to thrive) in adult 08/09/2017   Gastroesophageal reflux  disease without esophagitis 01/17/2017   Generalized anxiety disorder    Grade I diastolic dysfunction    Hammer toe 05/13/2018   Hematuria 11/08/2015   History of COVID-19 12/2020   History of stress incontinence    Hypergammaglobulinemia    Hyperlipidemia    Hypoxemia 12/16/2007   Insomnia    Leg length discrepancy    Loss of appetite    Lung nodule    Malignant neoplasm of thymus (HCC) 12/03/2007   Metastatic multiple myeloma to bone (HCC) 10/19/2015   Metatarsalgia of right foot 07/15/2018   MR (mitral regurgitation) 11/30/2017   trace noted on ECHO   Muscular fasciculation    Nasal septal deviation    Nasal turbinate hypertrophy    Near syncope    Obstructive sleep apnea 12/03/2007   Other fatigue    Peripheral edema    Plasma cell dyscrasia    PONV (postoperative nausea and vomiting)    thinks morphine caused nausea   Postoperative urinary retention 05/14/2018   Prediabetes    Pure hypercholesterolemia    Rash 11/01/2015   Restless leg syndrome    S/P autologous bone marrow transplantation (HCC) 03/29/2016   Shortness of breath 12/04/2007   TR (tricuspid regurgitation) 11/30/2017   trace noted on ECHO   Urinary incontinence    UTI (urinary tract infection)    Vaginal granulation tissue 12/03/2017   Vitamin D deficiency    Wears glasses     SURGICAL HISTORY: Past Surgical History:  Procedure Laterality Date   ABDOMINAL HYSTERECTOMY  1997   TAH--ovaries remain--Dr. Tracey Harries   ANTERIOR AND POSTERIOR REPAIR N/A 05/14/2018   Procedure: ANTERIOR (CYSTOCELE) AND POSTERIOR REPAIR (RECTOCELE);  Surgeon: Patton Salles, MD;  Location: [facility];  Service: Gynecology;  Laterality: N/A;   BLADDER SUSPENSION N/A 05/14/2018   Procedure: TRANSVAGINAL TAPE (TVT) PROCEDURE exact midurethral sling;  Surgeon: Patton Salles, MD;  Location: [facility];  Service: Gynecology;  Laterality: N/A;   BONE MARROW TRANSPLANT  2017   done at [facility]   BREAST BIOPSY      COLONOSCOPY     CYSTOSCOPY N/A 05/14/2018   Procedure: CYSTOSCOPY;  Surgeon: Patton Salles, MD;  Location: [facility];  Service: Gynecology;  Laterality: N/A;   CYSTOSCOPY N/A 05/30/2018   Procedure: CYSTOSCOPY;  Surgeon: Patton Salles, MD;  Location: [facility];  Service: Gynecology;  Laterality: N/A;   ROBOTIC ASSISTED LAPAROSCOPIC SACROCOLPOPEXY N/A 05/14/2018   Procedure: XI ROBOTIC ASSISTED LAPAROSCOPIC SACROCOLPOPEXY;  Surgeon: Patton Salles, MD;  Location: [facility];  Service: Gynecology;  Laterality: N/A;  6 hours OR time. Need extended stay recovery bed.   SALPINGOOPHORECTOMY Bilateral 05/14/2018   Procedure: SALPINGO OOPHORECTOMY;  Surgeon: Patton Salles, MD;  Location: [facility];  Service: Gynecology;  Laterality: Bilateral;   THYMECTOMY  1999   TRANSVAGINAL TAPE (TVT) REMOVAL N/A 05/30/2018   Procedure: TRANSVAGINAL TAPE (TVT)  Revision;  Surgeon: Patton Salles, MD;  Location: [facility];  Service: Gynecology;  Laterality: N/A;   WISDOM TOOTH EXTRACTION      SOCIAL HISTORY: Social History   Socioeconomic History   Marital status: Married    Spouse name: Paula Johnson   Number of children: 2   Years of education: 16   Highest education level: Bachelor's degree (e.g., BA, AB, BS)  Occupational History   Occupation: Retired    Comment: Engineer, 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  Tobacco Use   Smoking status: Never   Smokeless tobacco: Never  Vaping Use   Vaping Use: Never used  Substance and Sexual Activity   Alcohol use: Not Currently    Alcohol/week: 0.0 standard drinks of alcohol   Drug use: Never   Sexual activity: Not Currently    Partners: Male    Birth control/protection: Surgical    Comment: TAH--ovaries remain  Other Topics Concern   Not on file  Social History Narrative   Lives with husband   Caffeine- coffee, 2 cups daily   Right handed   Social Determinants of Health   Financial Resource Strain:  Not on file  Food Insecurity: Not on file  Transportation Needs: Not on file  Physical Activity: Not on file  Stress: Not on file  Social Connections: Not on file  Intimate Partner Violence: Not on file    FAMILY HISTORY: Family History  Problem Relation Age of Onset   Hyperlipidemia Mother    Memory loss Mother        90s; believed to be age-related memory decline   COPD Father        dec age 38/s-smoked   Macular degeneration Father    Behavior problems Father        at the end of life   Diabetes Brother    Macular degeneration Brother    Suicidality Brother    Diabetes Maternal Grandmother    Heart attack Paternal Grandfather     ALLERGIES:  is allergic to ampicillin and penicillins.  MEDICATIONS:  . Current Outpatient Medications:    acyclovir (ZOVIRAX) 400 MG tablet, Take 1 tablet (400 mg total) by mouth 2 (two) times daily., Disp: 60 tablet, Rfl: 11   Ascorbic Acid (VITAMIN C) 1000 MG tablet, Take 1,000 mg by mouth 2 (two) times daily., Disp: , Rfl:    aspirin EC 81 MG tablet, Take 81 mg by mouth daily., Disp: , Rfl:    b complex vitamins capsule, Take 1 capsule by mouth daily., Disp: , Rfl:    Cholecalciferol (VITAMIN D3) 30 MCG/15ML LIQD, Take by mouth. Pt. Takes 5000 IU daily, Disp: , Rfl:    Cyanocobalamin (B-12) 1000 MCG CAPS, , Disp: , Rfl:    dexamethasone (DECADRON) 4 MG tablet, Take 2 tablets (8 mg) the day of chemo treatment. (every 14 days), Disp: 20 tablet, Rfl: 2   EPINEPHrine 0.3 mg/0.3 mL IJ SOAJ injection, INJECT INTRAMUSCULARLLY AS DIRECTED AS NEEDED FOR ANAPHYLAXIS, Disp: , Rfl:    estradiol (ESTRACE) 0.1 MG/GM vaginal cream, INSERT  1 GRAM VAGINALLY TWICE A WEEK AT BEDTIME, Disp: 43 g, Rfl: 0   GLYCINE PO, Take 500 mg by mouth daily., Disp: , Rfl:    lenalidomide (REVLIMID) 10 MG capsule, TAKE ONE CAPSULE BY MOUTH DAILY FOR 21 DAYS, THEN 7 DAYS OFF, Disp: 21 capsule, Rfl: 0   LORazepam (ATIVAN) 0.5 MG tablet, Take 1 tablet (0.5 mg total) by mouth at  bedtime as needed for  sleep or anxiety., Disp: 30 tablet, Rfl: 0   Magnesium 400 MG CAPS, Take by mouth., Disp: , Rfl:    ondansetron (ZOFRAN) 8 MG tablet, Take 1 tablet (8 mg total) by mouth every 8 (eight) hours as needed for nausea or vomiting., Disp: 30 tablet, Rfl: 1   prochlorperazine (COMPAZINE) 10 MG tablet, Take 1 tablet (10 mg total) by mouth every 6 (six) hours as needed for nausea or vomiting., Disp: 30 tablet, Rfl: 1   LABORATORY DATA:  I have reviewed the data as listed     Latest Ref Rng & Units 02/21/2023    1:37 PM 01/24/2023   10:23 AM 12/27/2022   12:40 PM  CBC  WBC 4.0 - 10.5 K/uL 3.4  4.1  4.3   Hemoglobin 12.0 - 15.0 g/dL 16.1  09.6  04.5   Hematocrit 36.0 - 46.0 % 36.4  42.1  39.7   Platelets 150 - 400 K/uL 255  272  268       Latest Ref Rng & Units 01/24/2023   10:23 AM 12/13/2022   12:45 PM 11/29/2022   11:51 AM  CMP  Glucose 70 - 99 mg/dL 409  811  914   BUN 8 - 23 mg/dL 20  17  18    Creatinine 0.44 - 1.00 mg/dL 7.82  9.56  2.13   Sodium 135 - 145 mmol/L 140  138  140   Potassium 3.5 - 5.1 mmol/L 4.0  4.5  4.1   Chloride 98 - 111 mmol/L 106  103  106   CO2 22 - 32 mmol/L 28  27  27    Calcium 8.9 - 10.3 mg/dL 9.2  08.6  9.7   Total Protein 6.5 - 8.1 g/dL 6.9  7.0  7.1   Total Bilirubin 0.3 - 1.2 mg/dL 1.0  0.7  1.1   Alkaline Phos 38 - 126 U/L 74  75  72   AST 15 - 41 U/L 26  19  26    ALT 0 - 44 U/L 36  25  28      01/10/2023 Bone Marrow Biopsy:    10/10/2019 Flow Cytometry (Repeat BM Bx from [facility]):   10/07/18 Repeat BM Bx from [facility]:   Surgical Pathology                                Case: [id number]                               Authorizing Provider:  Rodney Langton, NP    Collected:           07/04/2022 1015             Ordering Location:     [facility] and   Received:            07/04/2022 1048                                    [facility]                                                   Pathologist:  Delilah Shan, MD                                                   Specimens:   A) - Bone Marrow, Aspirate                                                                        B) - Bone Marrow, Biopsy                                                                          C) - Bone Marrow, Clot                                                                 DIAGNOSIS   A-C. Peripheral blood and bone marrow (peripheral smear, aspirate, touch preparation, core biopsy, and clot section): - Low-level plasma cell neoplasm, see comment.   COMMENT: The core biopsy is 40% cellular. Cyclin-D1 immunohistochemical stain demonstrates very rare positive plasma cells (1%). Concurrent flow cytometry study ([id number]) shows a plasma cell population with an atypical immunophenotype, comprising approximately 0.04% of analyzed events.    Reviewed by resident/fellow Zachery Conch  Electronically signed by Delilah Shan, MD on 07/10/2022 at 12:25 PM  Clinical Information   67 year old female with history of plasma cell myeloma.  Gross Examination   A. Peripheral blood and bone marrow labeled with patient's name and medical record number, one peripheral blood smear, three bone marrow aspirate smears and one bone marrow touch preparation are received.   B. "Bone marrow biopsy", in AZF.  Core biopsies of red-brown tissue 1.4 x 0.2 cm in aggregate are submitted in total as block B1 for decalcification.   C. "Bone marrow clot", in AZF.  A 1.8 x 1.8 x 0.3 cm aggregate of red-brown clot is submitted in total as block C1.    J.Elam/ Dr. Alvera Singh   Microscopic Examination        Lab Results  Component Value Date    WBC 5.2 07/04/2022    HGB 12.1 07/04/2022    PLT 364 07/04/2022    MCV 95 07/04/2022    RDWCV 14.6 (H) 07/04/2022    Peripheral blood and automated blood count:  RBCs: The red blood cells are adequate in number,  normocytic and normochromic, with minimal  anisopoikilocytosis. WBCs: The white blood cells are adequate in number and show unremarkable morphology. Platelets: The platelets are adequate in number and show unremarkable morphology.   Bone marrow aspirate:  There are adequate cellular particles. M:E ratio is 3:1.  Granulopoiesis: Maturing myeloid elements are present and show  complete maturation. Erythropoiesis:  Erythroid elements are present and show complete maturation. Megakaryocytes: Megakaryocytes are adequate in number and show unremarkable morphology.  There are slightly increased plasma cells with unremarkable morphology.   Bone marrow touch preparation: Adequate marrow elements are present, without additional findings to the aspirate smears.   Prussian blue stained aspirate smear: 0 of 4 stainable iron is present. No ringed sideroblasts are identified.   Bone marrow core biopsy:  The core biopsy is adequate for evaluation. The cellularity is 40%. Maturing myeloid elements are present and show complete maturation. Maturing erythroid elements are present and show complete maturation.  Megakaryocytes are adequate in number and show unremarkable morphology. There are slightly increased plasma cells.   A reticulin stain of the core biopsy shows no significant reticulin fibrosis.   Aspirate clot section: There are adequate particles present, without additional findings to the core biopsy.   Immunohistochemical stains for Cyclin-D1, CD138, Kappa CISH, and Lambda CISH are performed on the core biopsy section (block B1). CD138, kappa, and lambda in-situ hybridization demonstrates 5-10% predominantly polytypic plasma cells. Cyclin-D1 demonstrates very rare positive plasma cells (1%).  Bone Marrow Differential   500 CELL DIFF   Cell Type Cell Count Reference Range Cell Type Cell Count Reference Range Cell Type Cell Count Reference Range  Segs 23 (7-25) Lymphocytes 15 (3-20) Plasma Cells 8 (0-3.5)  Bands 3 (6-36)     Atypicals 0    RBC Precursors 22 (10-30)  Metas 2 (9-25) Lymphoblasts *   Pronormoblasts 0 (0-3)  Myelos 5 (8-15) Eosinophils 12 (0-4) M:E Ratio 3/1    Promyelos 1 (1-6) Basophils 0 (0-1) Iron 0 (Scale 0-4+)  Myeloblasts 1 (0-3.5) Monocytes 8 (0-2)                                                                                                     Performed by:    Maryclare Bean                                                                                                         July 04, 2022 12:21 PM  Additional Documentation   All immunohistochemistry, in situ hybridization tests and special stains performed at [facility] and reported herein were developed, validated and their performance characteristics determined by the [facility]. During the performance of these tests, appropriate positive and negative control slides are also performed and reviewed. All control slides and internal controls (when applicable) demonstrate the expected immunoreactive patterns and/or nucleic acid hybridization. These ancillary studies were deemed medically necessary by the requesting pathologist. They were ordered following review of the H&E and clinical history except  when part of a liver/kidney protocol or where clinical history (e.g. immunocompromised, critically ill, history of malignancy) clearly indicates. Some of the tests may not be cleared or approved by the U.S. Food and Drug Administration (FDA).  The FDA has determined that such clearance or approval is not necessary.  These tests are used for clinical purposes and should not be regarded as investigational or as research.  This laboratory is certified under the Clinical Laboratory Improvement Amendments of 1988 (CLIA) as qualified to perform high complexity clinical testing. At least one block in this case underwent decalcification during its processing. The vast majority of immunohistochemical and hybridization assays were not  validated on decalcified tissues. The results were interpreted with caution given the possibility of false negative results on decalcified specimens.  Attestation   All of the diagnostic evaluations on the enumerated specimens have been personally conducted by the pathologists involved in the care of this patient as indicated by the electronic signatures above.  Resulting Agency [facility] SURGICAL PATHOLOGY AND CYTOPATHOLOGY   Specimen Collected: 07/04/22 10:15   Performed by: [facility] SURGICAL PATHOLOGY AND CYTOPATHOLOGY Last Resulted: 07/10/22 12:25  Received From: [facility]  Result Received: 07/14/22 09:58   View Encounter      Received Information Pathology - Bone Marrow (Order [id number])    suggestion  Information displayed in this report may not trend or trigger automated decision support.    Pathology - Bone Marrow Order: [id number] Component 4 wk ago  Case Report Surgical Pathology                                Case: [id number]                               Authorizing Provider:  Rodney Langton, NP    Collected:           07/04/2022 1015             Ordering Location:     [facility]   Received:            07/04/2022 1048                                    [facility]                                                   Pathologist:           Delilah Shan, MD                                                   Specimens:   A) - Bone Marrow, Aspirate  B) - Bone Marrow, Biopsy                                                                          C) - Bone Marrow, Clot                                                                DIAGNOSIS   A-C. Peripheral blood and bone marrow (peripheral smear, aspirate, touch preparation, core biopsy, and clot section): - Low-level plasma cell neoplasm, see comment.   COMMENT: The core biopsy is 40% cellular. Cyclin-D1  immunohistochemical stain demonstrates very rare positive plasma cells (1%). Concurrent flow cytometry study ([id number]) shows a plasma cell population with an atypical immunophenotype, comprising approximately 0.04% of analyzed events.    Reviewed by resident/fellow Zachery Conch  Electronically signed by Delilah Shan, MD on 07/10/2022 at 12:25 PM  Clinical Information   67 year old female with history of plasma cell myeloma.  Gross Examination   A. Peripheral blood and bone marrow labeled with patient's name and medical record number, one peripheral blood smear, three bone marrow aspirate smears and one bone marrow touch preparation are received.   B. "Bone marrow biopsy", in AZF.  Core biopsies of red-brown tissue 1.4 x 0.2 cm in aggregate are submitted in total as block B1 for decalcification.   C. "Bone marrow clot", in AZF.  A 1.8 x 1.8 x 0.3 cm aggregate of red-brown clot is submitted in total as block C1.    J.Elam/ Dr. Alvera Singh     RADIOGRAPHIC STUDIES: I have personally reviewed the radiological images as listed and agreed with the findings in the report. No results found.  ASSESSMENT & PLAN:   67 y.o. caucasian female with  1. H/o Multiple myeloma - currently in remission.  But bone marrow biopsy shows she has turned MRD positive  2. IgA lambda R-ISS stage II multiple myeloma with innumerable lytic lesions in the calvarium, lesion in T7 vertebra and multiple lesions in the pelvis.  No significant bone pain at this time. Diagnosed in January 2017.  s/p treatment as noted above 10/07/18 BM Bx and Flow cytometry indicate that the pt continues to be in CR 10/10/2019 Flow Cytometry revealed "A. Bone Marrow (Plasma Cell Myeloma Minimal Residual Disease Detection by Flow Cytometry): Negative. No phenotypically abnormal plasma cells at or above the limit of detection identified."  PLAN:   -01/10/2023 MRD 0.002% abnormal plasma cells, improved from 0.04% in  07/04/2022 -Discussed lab results on 03/21/2023 in detail with patient. CBC normal, showed WBC of 4.7K, hemoglobin of 13.7, and platelets of 316K. -CMP normal -Last myeloma panel showed 0.1 igG, likely due to Daratumumab -No new toxicities from her Daratumumab every 4 weeks and maintenance Revlimid.  -continue Dara every 4 weeks -continue Dexamethasone 8 MG -Continue acyclovir for VZV prophylaxis -Continue aspirin for VTE prophylaxis. -reasonable to optimize iron level with ferrous sulfate 65 MG -continue maintence Revlimid and once a month Daratumumab -continue once a month darzalex   FOLLOW-UP:  Per integrated scheduling for Dara MD visit in 2 months  The total time spent in the appointment was 20 minutes* .  All of the patient's questions were answered with apparent satisfaction. The patient knows to call the clinic with any problems, questions or concerns.   Wyvonnia Lora MD MS AAHIVMS [facility] Hematology/Oncology Physician [facility]  .*Total Encounter Time as defined by the Centers for Medicare and Medicaid Services includes, in addition to the face-to-face time of a patient visit (documented in the note above) non-face-to-face time: obtaining and reviewing outside history, ordering and reviewing medications, tests or procedures, care coordination (communications with other health care professionals or caregivers) and documentation in the medical record.    I,Mitra Faeizi,acting as a Neurosurgeon for Wyvonnia Lora, MD.,have documented all relevant documentation on the behalf of Wyvonnia Lora, MD,as directed by  Wyvonnia Lora, MD while in the presence of Wyvonnia Lora, MD.  .I have reviewed the above documentation for accuracy and completeness, and I agree with the above. Johney Maine MD

## 2023-03-22 ENCOUNTER — Other Ambulatory Visit: Payer: Self-pay

## 2023-03-26 LAB — MULTIPLE MYELOMA PANEL, SERUM
Albumin SerPl Elph-Mcnc: 4 g/dL (ref 2.9–4.4)
Albumin/Glob SerPl: 1.7 (ref 0.7–1.7)
Alpha 1: 0.2 g/dL (ref 0.0–0.4)
Alpha2 Glob SerPl Elph-Mcnc: 0.8 g/dL (ref 0.4–1.0)
B-Globulin SerPl Elph-Mcnc: 1 g/dL (ref 0.7–1.3)
Gamma Glob SerPl Elph-Mcnc: 0.6 g/dL (ref 0.4–1.8)
Globulin, Total: 2.5 g/dL (ref 2.2–3.9)
IgA: 133 mg/dL (ref 87–352)
IgG (Immunoglobin G), Serum: 724 mg/dL (ref 586–1602)
IgM (Immunoglobulin M), Srm: 34 mg/dL (ref 26–217)
Total Protein ELP: 6.5 g/dL (ref 6.0–8.5)

## 2023-03-27 ENCOUNTER — Encounter: Payer: Self-pay | Admitting: Hematology

## 2023-04-04 NOTE — Progress Notes (Deleted)
67 y.o. G76P2002 Married Caucasian female here for annual exam.    PCP:     Patient's last menstrual period was 09/26/1995 (within months).           Sexually active: {yes no:314532}  The current method of family planning is status post hysterectomy.    Exercising: {yes no:314532}  {types:19826} Smoker:  no  Health Maintenance: Pap:  03/08/22 ASCUS: HR HPV neg, 02-24-21 LSIL, 03-22-17 ASCUS:Neg HR HPV  History of abnormal Pap:  yes, 03-14-21 colpo bx showed only atypia. 02-24-21 LSIL  MMG:  01/25/23 Breast density Cat B, BI-RADS CAT 1 neg Colonoscopy:  07/2020 normal BMD:   03/05/23 Result  normal TDaP:  2018 Gardasil:   no HIV: neg in the past Hep C: neg in the past Screening Labs:  Hb today: ***, Urine today: ***   reports that she has never smoked. She has never used smokeless tobacco. She reports that she does not currently use alcohol. She reports that she does not use drugs.  Past Medical History:  Diagnosis Date   Abnormal Pap smear of vagina 03/22/2017   ASCUS pap and negative HR HPV.  Patient is on immunosuppressive medication.    Allergic reaction to bee sting    Anemia    AR (aortic regurgitation) 11/30/2017   trace noted on ECHO   Atrial septal aneurysm per 11-30-17 echo   trivial pericardial effusion   Complicated migraine    Constipation    DDD (degenerative disc disease), cervical    DDD (degenerative disc disease), lumbosacral    Fatty liver    Female proctocele without uterine prolapse    Fibroid    reason for Hysterectomy   FTT (failure to thrive) in adult 08/09/2017   Gastroesophageal reflux disease without esophagitis 01/17/2017   Generalized anxiety disorder    Grade I diastolic dysfunction    Hammer toe 05/13/2018   Hematuria 11/08/2015   History of COVID-19 12/2020   History of stress incontinence    Hypergammaglobulinemia    Hyperlipidemia    Hypoxemia 12/16/2007   Insomnia    Leg length discrepancy    Loss of appetite    Lung nodule    Malignant  neoplasm of thymus (HCC) 12/03/2007   Metastatic multiple myeloma to bone (HCC) 10/19/2015   Metatarsalgia of right foot 07/15/2018   MR (mitral regurgitation) 11/30/2017   trace noted on ECHO   Muscular fasciculation    Nasal septal deviation    Nasal turbinate hypertrophy    Near syncope    Obstructive sleep apnea 12/03/2007   Other fatigue    Peripheral edema    Plasma cell dyscrasia    PONV (postoperative nausea and vomiting)    thinks morphine caused nausea   Postoperative urinary retention 05/14/2018   Prediabetes    Pure hypercholesterolemia    Rash 11/01/2015   Restless leg syndrome    S/P autologous bone marrow transplantation (HCC) 03/29/2016   Shortness of breath 12/04/2007   TR (tricuspid regurgitation) 11/30/2017   trace noted on ECHO   Urinary incontinence    UTI (urinary tract infection)    Vaginal granulation tissue 12/03/2017   Vitamin D deficiency    Wears glasses     Past Surgical History:  Procedure Laterality Date   ABDOMINAL HYSTERECTOMY  1997   TAH--ovaries remain--Dr. Tracey Harries   ANTERIOR AND POSTERIOR REPAIR N/A 05/14/2018   Procedure: ANTERIOR (CYSTOCELE) AND POSTERIOR REPAIR (RECTOCELE);  Surgeon: Patton Salles, MD;  Location: [facility];  Service: Gynecology;  Laterality: N/A;   BLADDER SUSPENSION N/A 05/14/2018   Procedure: TRANSVAGINAL TAPE (TVT) PROCEDURE exact midurethral sling;  Surgeon: Patton Salles, MD;  Location: [facility];  Service: Gynecology;  Laterality: N/A;   BONE MARROW TRANSPLANT  2017   done at [facility]   BREAST BIOPSY     COLONOSCOPY     CYSTOSCOPY N/A 05/14/2018   Procedure: CYSTOSCOPY;  Surgeon: Patton Salles, MD;  Location: [facility];  Service: Gynecology;  Laterality: N/A;   CYSTOSCOPY N/A 05/30/2018   Procedure: CYSTOSCOPY;  Surgeon: Patton Salles, MD;  Location: [facility];  Service: Gynecology;  Laterality: N/A;   ROBOTIC ASSISTED LAPAROSCOPIC SACROCOLPOPEXY N/A  05/14/2018   Procedure: XI ROBOTIC ASSISTED LAPAROSCOPIC SACROCOLPOPEXY;  Surgeon: Patton Salles, MD;  Location: [facility];  Service: Gynecology;  Laterality: N/A;  6 hours OR time. Need extended stay recovery bed.   SALPINGOOPHORECTOMY Bilateral 05/14/2018   Procedure: SALPINGO OOPHORECTOMY;  Surgeon: Patton Salles, MD;  Location: [facility];  Service: Gynecology;  Laterality: Bilateral;   THYMECTOMY  1999   TRANSVAGINAL TAPE (TVT) REMOVAL N/A 05/30/2018   Procedure: TRANSVAGINAL TAPE (TVT)  Revision;  Surgeon: Patton Salles, MD;  Location: [facility];  Service: Gynecology;  Laterality: N/A;   WISDOM TOOTH EXTRACTION      Current Outpatient Medications  Medication Sig Dispense Refill   acyclovir (ZOVIRAX) 400 MG tablet Take 1 tablet (400 mg total) by mouth 2 (two) times daily. 60 tablet 11   Ascorbic Acid (VITAMIN C) 1000 MG tablet Take 1,000 mg by mouth 2 (two) times daily.     aspirin EC 81 MG tablet Take 81 mg by mouth daily.     b complex vitamins capsule Take 1 capsule by mouth daily.     Cholecalciferol (VITAMIN D3) 30 MCG/15ML LIQD Take by mouth. Pt. Takes 5000 IU daily     Cyanocobalamin (B-12) 1000 MCG CAPS      dexamethasone (DECADRON) 4 MG tablet Take 2 tablets (8 mg) the day of chemo treatment. (every 14 days) 20 tablet 2   EPINEPHrine 0.3 mg/0.3 mL IJ SOAJ injection INJECT INTRAMUSCULARLLY AS DIRECTED AS NEEDED FOR ANAPHYLAXIS     estradiol (ESTRACE) 0.1 MG/GM vaginal cream INSERT  1 GRAM VAGINALLY TWICE A WEEK AT BEDTIME 43 g 0   GLYCINE PO Take 500 mg by mouth daily.     Iron, Ferrous Sulfate, 325 (65 Fe) MG TABS Take 325 mg by mouth daily.     lenalidomide (REVLIMID) 10 MG capsule TAKE ONE CAPSULE BY MOUTH DAILY FOR 21 DAYS, THEN 7 DAYS OFF 21 capsule 0   LORazepam (ATIVAN) 0.5 MG tablet Take 1 tablet (0.5 mg total) by mouth at bedtime as needed for sleep or anxiety. 30 tablet 0   Magnesium 400 MG CAPS Take by mouth.     ondansetron  (ZOFRAN) 8 MG tablet Take 1 tablet (8 mg total) by mouth every 8 (eight) hours as needed for nausea or vomiting. 30 tablet 1   prochlorperazine (COMPAZINE) 10 MG tablet Take 1 tablet (10 mg total) by mouth every 6 (six) hours as needed for nausea or vomiting. 30 tablet 1   No current facility-administered medications for this visit.    Family History  Problem Relation Age of Onset   Hyperlipidemia Mother    Memory loss Mother        12s; believed to be  age-related memory decline   COPD Father        dec age 12/s-smoked   Macular degeneration Father    Behavior problems Father        at the end of life   Diabetes Brother    Macular degeneration Brother    Suicidality Brother    Diabetes Maternal Grandmother    Heart attack Paternal Grandfather     Review of Systems  Exam:   LMP 09/26/1995 (Within Months)     General appearance: alert, cooperative and appears stated age Head: normocephalic, without obvious abnormality, atraumatic Neck: no adenopathy, supple, symmetrical, trachea midline and thyroid normal to inspection and palpation Lungs: clear to auscultation bilaterally Breasts: normal appearance, no masses or tenderness, No nipple retraction or dimpling, No nipple discharge or bleeding, No axillary adenopathy Heart: regular rate and rhythm Abdomen: soft, non-tender; no masses, no organomegaly Extremities: extremities normal, atraumatic, no cyanosis or edema Skin: skin color, texture, turgor normal. No rashes or lesions Lymph nodes: cervical, supraclavicular, and axillary nodes normal. Neurologic: grossly normal  Pelvic: External genitalia:  no lesions              No abnormal inguinal nodes palpated.              Urethra:  normal appearing urethra with no masses, tenderness or lesions              Bartholins and Skenes: normal                 Vagina: normal appearing vagina with normal color and discharge, no lesions              Cervix: no lesions              Pap  taken: {yes no:314532} Bimanual Exam:  Uterus:  normal size, contour, position, consistency, mobility, non-tender              Adnexa: no mass, fullness, tenderness              Rectal exam: {yes no:314532}.  Confirms.              Anus:  normal sphincter tone, no lesions  Chaperone was present for exam:  ***  Assessment:   Well woman visit with gynecologic exam.   Plan: Mammogram screening discussed. Self breast awareness reviewed. Pap and HR HPV as above. Guidelines for Calcium, Vitamin D, regular exercise program including cardiovascular and weight bearing exercise.   Follow up annually and prn.   Additional counseling given.  {yes [id number]. _______ minutes face to face time of which over 50% was spent in counseling.    After visit summary provided.

## 2023-04-05 ENCOUNTER — Other Ambulatory Visit: Payer: Self-pay

## 2023-04-11 ENCOUNTER — Ambulatory Visit: Payer: Medicare Other | Admitting: Obstetrics and Gynecology

## 2023-04-12 ENCOUNTER — Other Ambulatory Visit: Payer: Self-pay

## 2023-04-12 DIAGNOSIS — C9001 Multiple myeloma in remission: Secondary | ICD-10-CM

## 2023-04-12 MED ORDER — LENALIDOMIDE 10 MG PO CAPS
ORAL_CAPSULE | ORAL | 0 refills | Status: DC
Start: 2023-04-12 — End: 2023-05-11

## 2023-04-18 ENCOUNTER — Other Ambulatory Visit: Payer: Medicare Other

## 2023-04-18 ENCOUNTER — Inpatient Hospital Stay (HOSPITAL_BASED_OUTPATIENT_CLINIC_OR_DEPARTMENT_OTHER): Payer: Medicare Other | Admitting: Physician Assistant

## 2023-04-18 ENCOUNTER — Inpatient Hospital Stay: Payer: Medicare Other | Attending: Hematology

## 2023-04-18 ENCOUNTER — Ambulatory Visit: Payer: Medicare Other | Admitting: Hematology

## 2023-04-18 ENCOUNTER — Ambulatory Visit: Payer: Medicare Other

## 2023-04-18 ENCOUNTER — Other Ambulatory Visit: Payer: Self-pay

## 2023-04-18 ENCOUNTER — Inpatient Hospital Stay: Payer: Medicare Other

## 2023-04-18 VITALS — BP 121/65 | HR 83 | Temp 97.3°F | Resp 18 | Wt 130.3 lb

## 2023-04-18 DIAGNOSIS — Z79899 Other long term (current) drug therapy: Secondary | ICD-10-CM | POA: Insufficient documentation

## 2023-04-18 DIAGNOSIS — Z5112 Encounter for antineoplastic immunotherapy: Secondary | ICD-10-CM | POA: Diagnosis present

## 2023-04-18 DIAGNOSIS — C9001 Multiple myeloma in remission: Secondary | ICD-10-CM | POA: Diagnosis present

## 2023-04-18 DIAGNOSIS — C9000 Multiple myeloma not having achieved remission: Secondary | ICD-10-CM | POA: Diagnosis not present

## 2023-04-18 LAB — CBC WITH DIFFERENTIAL (CANCER CENTER ONLY)
Abs Immature Granulocytes: 0.01 10*3/uL (ref 0.00–0.07)
Basophils Absolute: 0 10*3/uL (ref 0.0–0.1)
Basophils Relative: 1 %
Eosinophils Absolute: 0.1 10*3/uL (ref 0.0–0.5)
Eosinophils Relative: 2 %
HCT: 39 % (ref 36.0–46.0)
Hemoglobin: 13.3 g/dL (ref 12.0–15.0)
Immature Granulocytes: 0 %
Lymphocytes Relative: 9 %
Lymphs Abs: 0.3 10*3/uL — ABNORMAL LOW (ref 0.7–4.0)
MCH: 31.4 pg (ref 26.0–34.0)
MCHC: 34.1 g/dL (ref 30.0–36.0)
MCV: 92.2 fL (ref 80.0–100.0)
Monocytes Absolute: 0.2 10*3/uL (ref 0.1–1.0)
Monocytes Relative: 6 %
Neutro Abs: 3.1 10*3/uL (ref 1.7–7.7)
Neutrophils Relative %: 82 %
Platelet Count: 270 10*3/uL (ref 150–400)
RBC: 4.23 MIL/uL (ref 3.87–5.11)
RDW: 14.5 % (ref 11.5–15.5)
WBC Count: 3.8 10*3/uL — ABNORMAL LOW (ref 4.0–10.5)
nRBC: 0 % (ref 0.0–0.2)

## 2023-04-18 LAB — CMP (CANCER CENTER ONLY)
ALT: 19 U/L (ref 0–44)
AST: 17 U/L (ref 15–41)
Albumin: 4.3 g/dL (ref 3.5–5.0)
Alkaline Phosphatase: 79 U/L (ref 38–126)
Anion gap: 6 (ref 5–15)
BUN: 16 mg/dL (ref 8–23)
CO2: 28 mmol/L (ref 22–32)
Calcium: 9.8 mg/dL (ref 8.9–10.3)
Chloride: 106 mmol/L (ref 98–111)
Creatinine: 0.73 mg/dL (ref 0.44–1.00)
GFR, Estimated: >60 mL/min (ref >60)
Glucose, Bld: 95 mg/dL (ref 70–99)
Potassium: 3.9 mmol/L (ref 3.5–5.1)
Sodium: 140 mmol/L (ref 135–145)
Total Bilirubin: 1.1 mg/dL (ref 0.3–1.2)
Total Protein: 6.6 g/dL (ref 6.5–8.1)

## 2023-04-18 MED ORDER — ACETAMINOPHEN 325 MG PO TABS
650.0000 mg | ORAL_TABLET | Freq: Once | ORAL | Status: AC
  Administered 2023-04-18: 650 mg via ORAL
  Filled 2023-04-18: qty 2

## 2023-04-18 MED ORDER — FAMOTIDINE 20 MG PO TABS
20.0000 mg | ORAL_TABLET | Freq: Once | ORAL | Status: AC
  Administered 2023-04-18: 20 mg via ORAL
  Filled 2023-04-18: qty 1

## 2023-04-18 MED ORDER — DIPHENHYDRAMINE HCL 25 MG PO CAPS
25.0000 mg | ORAL_CAPSULE | Freq: Once | ORAL | Status: AC
  Administered 2023-04-18: 25 mg via ORAL
  Filled 2023-04-18: qty 1

## 2023-04-18 MED ORDER — DARATUMUMAB-HYALURONIDASE-FIHJ 1800-30000 MG-UT/15ML ~~LOC~~ SOLN
1800.0000 mg | Freq: Once | SUBCUTANEOUS | Status: AC
  Administered 2023-04-18: 1800 mg via SUBCUTANEOUS
  Filled 2023-04-18: qty 15

## 2023-04-18 NOTE — Progress Notes (Signed)
Patient took steroid at home prior to treatment.

## 2023-04-18 NOTE — Patient Instructions (Signed)

## 2023-04-20 ENCOUNTER — Encounter: Payer: Self-pay | Admitting: Hematology

## 2023-04-20 NOTE — Progress Notes (Signed)
HEMATOLOGY/ONCOLOGY CLINIC NOTE  Date of Service: 04/18/23   PCP: Tally Joe, MD Transplant Oncologist -- Dr Eddie Candle MD  CHIEF COMPLAINT: Follow-up for continued evaluation and management of multiple myeloma  DIAGNOSIS: IgA lambda R-ISS stage II multiple myeloma with innumerable lytic lesions in the calvarium, lesion in T7 vertebra and multiple lesions in the pelvis.  Diagnosed in January 2017.   Current treatment: Revlimid 10mg  po daily 3 weeks on 1 week off (after treatment interruption) Daratumumab added 11/15 for loss of MRD negative status   Previous Treatment: Status post Vd x 1 cycle VRd x 6 cycles   HD Melphalan 200mg /m2 on 03/22/2016 Autologous HSCT on 03/23/2016 with Dr Donnie Coffin at [facility] (Dose of 6.4 x10^6/kg)   INTERVAL HISTORY:   Paula Johnson is a 67 y.o. female is here for continued evaluation and management of multiple myeloma She is due for Cycle 10, Day 1 of Daratumumab. She is unaccompanied for this visit.   Paula Johnson continues to tolerate Daratumumab without any concerning symptoms. Her energy and appetite are stable. She is able to complete all her ALDs independently. She denies any nausea, vomiting or abdominal pain. Her bowel habits are unchanged. She denies easy bruising or signs of active bleeding. She has no new bone or back pain. She denies fevers, chills, sweats, shortness of breath, chest pain or cough. She has no other complaints.    REVIEW OF SYSTEMS:   10 Point review of Systems was done is negative except as noted above.   PHYSICAL EXAMINATION: .BP 121/65   Pulse 83   Temp (!) 97.3 F (36.3 C)   Resp 18   Wt 130 lb 4.8 oz (59.1 kg)   LMP 09/26/1995 (Within Months)   SpO2 98%   BMI 19.81 kg/m   GENERAL:alert, in no acute distress and comfortable SKIN: no acute rashes, no significant lesions EYES: conjunctiva are pink and non-injected, sclera anicteric LUNGS: clear to auscultation b/l with normal respiratory  effort HEART: regular rate & rhythm Extremity: no pedal edema PSYCH: alert & oriented x 3 with fluent speech NEURO: no focal motor/sensory deficits   MEDICAL HISTORY:   Past Medical History:  Diagnosis Date   Abnormal Pap smear of vagina 03/22/2017   ASCUS pap and negative HR HPV.  Patient is on immunosuppressive medication.    Allergic reaction to bee sting    Anemia    AR (aortic regurgitation) 11/30/2017   trace noted on ECHO   Atrial septal aneurysm per 11-30-17 echo   trivial pericardial effusion   Complicated migraine    Constipation    DDD (degenerative disc disease), cervical    DDD (degenerative disc disease), lumbosacral    Fatty liver    Female proctocele without uterine prolapse    Fibroid    reason for Hysterectomy   FTT (failure to thrive) in adult 08/09/2017   Gastroesophageal reflux disease without esophagitis 01/17/2017   Generalized anxiety disorder    Grade I diastolic dysfunction    Hammer toe 05/13/2018   Hematuria 11/08/2015   History of COVID-19 12/2020   History of stress incontinence    Hypergammaglobulinemia    Hyperlipidemia    Hypoxemia 12/16/2007   Insomnia    Leg length discrepancy    Loss of appetite    Lung nodule    Malignant neoplasm of thymus (HCC) 12/03/2007   Metastatic multiple myeloma to bone (HCC) 10/19/2015   Metatarsalgia of right foot 07/15/2018   MR (mitral regurgitation) 11/30/2017   trace noted  on ECHO   Muscular fasciculation    Nasal septal deviation    Nasal turbinate hypertrophy    Near syncope    Obstructive sleep apnea 12/03/2007   Other fatigue    Peripheral edema    Plasma cell dyscrasia    PONV (postoperative nausea and vomiting)    thinks morphine caused nausea   Postoperative urinary retention 05/14/2018   Prediabetes    Pure hypercholesterolemia    Rash 11/01/2015   Restless leg syndrome    S/P autologous bone marrow transplantation (HCC) 03/29/2016   Shortness of breath 12/04/2007   TR (tricuspid  regurgitation) 11/30/2017   trace noted on ECHO   Urinary incontinence    UTI (urinary tract infection)    Vaginal granulation tissue 12/03/2017   Vitamin D deficiency    Wears glasses     SURGICAL HISTORY: Past Surgical History:  Procedure Laterality Date   ABDOMINAL HYSTERECTOMY  1997   TAH--ovaries remain--Dr. Tracey Harries   ANTERIOR AND POSTERIOR REPAIR N/A 05/14/2018   Procedure: ANTERIOR (CYSTOCELE) AND POSTERIOR REPAIR (RECTOCELE);  Surgeon: Patton Salles, MD;  Location: [facility];  Service: Gynecology;  Laterality: N/A;   BLADDER SUSPENSION N/A 05/14/2018   Procedure: TRANSVAGINAL TAPE (TVT) PROCEDURE exact midurethral sling;  Surgeon: Patton Salles, MD;  Location: [facility];  Service: Gynecology;  Laterality: N/A;   BONE MARROW TRANSPLANT  2017   done at [facility]   BREAST BIOPSY     COLONOSCOPY     CYSTOSCOPY N/A 05/14/2018   Procedure: CYSTOSCOPY;  Surgeon: Patton Salles, MD;  Location: [facility];  Service: Gynecology;  Laterality: N/A;   CYSTOSCOPY N/A 05/30/2018   Procedure: CYSTOSCOPY;  Surgeon: Patton Salles, MD;  Location: [facility];  Service: Gynecology;  Laterality: N/A;   ROBOTIC ASSISTED LAPAROSCOPIC SACROCOLPOPEXY N/A 05/14/2018   Procedure: XI ROBOTIC ASSISTED LAPAROSCOPIC SACROCOLPOPEXY;  Surgeon: Patton Salles, MD;  Location: [facility];  Service: Gynecology;  Laterality: N/A;  6 hours OR time. Need extended stay recovery bed.   SALPINGOOPHORECTOMY Bilateral 05/14/2018   Procedure: SALPINGO OOPHORECTOMY;  Surgeon: Patton Salles, MD;  Location: [facility];  Service: Gynecology;  Laterality: Bilateral;   THYMECTOMY  1999   TRANSVAGINAL TAPE (TVT) REMOVAL N/A 05/30/2018   Procedure: TRANSVAGINAL TAPE (TVT)  Revision;  Surgeon: Patton Salles, MD;  Location: [facility];  Service: Gynecology;  Laterality: N/A;   WISDOM TOOTH EXTRACTION      SOCIAL HISTORY: Social History    Socioeconomic History   Marital status: Married    Spouse name: Leonette Most   Number of children: 2   Years of education: 16   Highest education level: Bachelor's degree (e.g., BA, AB, BS)  Occupational History   Occupation: Retired    Comment: Engineer, 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  Tobacco Use   Smoking status: Never   Smokeless tobacco: Never  Vaping Use   Vaping status: Never Used  Substance and Sexual Activity   Alcohol use: Not Currently    Alcohol/week: 0.0 standard drinks of alcohol   Drug use: Never   Sexual activity: Not Currently    Partners: Male    Birth control/protection: Surgical    Comment: TAH--ovaries remain  Other Topics Concern   Not on file  Social History Narrative   Lives with husband   Caffeine- coffee, 2 cups daily   Right handed   Social Determinants of Health  Financial Resource Strain: Not on file  Food Insecurity: Not on file  Transportation Needs: Not on file  Physical Activity: Not on file  Stress: Not on file  Social Connections: Not on file  Intimate Partner Violence: Not on file    FAMILY HISTORY: Family History  Problem Relation Age of Onset   Hyperlipidemia Mother    Memory loss Mother        47s; believed to be age-related memory decline   COPD Father        dec age 74/s-smoked   Macular degeneration Father    Behavior problems Father        at the end of life   Diabetes Brother    Macular degeneration Brother    Suicidality Brother    Diabetes Maternal Grandmother    Heart attack Paternal Grandfather     ALLERGIES:  is allergic to ampicillin and penicillins.  MEDICATIONS:  . Current Outpatient Medications:    acyclovir (ZOVIRAX) 400 MG tablet, Take 1 tablet (400 mg total) by mouth 2 (two) times daily., Disp: 60 tablet, Rfl: 11   Ascorbic Acid (VITAMIN C) 1000 MG tablet, Take 1,000 mg by mouth 2 (two) times daily., Disp: , Rfl:    aspirin EC 81 MG tablet, Take 81 mg by mouth daily., Disp: , Rfl:    b complex vitamins capsule,  Take 1 capsule by mouth daily., Disp: , Rfl:    Cholecalciferol (VITAMIN D3) 30 MCG/15ML LIQD, Take by mouth. Pt. Takes 5000 IU daily, Disp: , Rfl:    Cyanocobalamin (B-12) 1000 MCG CAPS, , Disp: , Rfl:    dexamethasone (DECADRON) 4 MG tablet, Take 2 tablets (8 mg) the day of chemo treatment. (every 14 days), Disp: 20 tablet, Rfl: 2   EPINEPHrine 0.3 mg/0.3 mL IJ SOAJ injection, INJECT INTRAMUSCULARLLY AS DIRECTED AS NEEDED FOR ANAPHYLAXIS, Disp: , Rfl:    estradiol (ESTRACE) 0.1 MG/GM vaginal cream, INSERT  1 GRAM VAGINALLY TWICE A WEEK AT BEDTIME, Disp: 43 g, Rfl: 0   Iron, Ferrous Sulfate, 325 (65 Fe) MG TABS, Take 325 mg by mouth daily., Disp: , Rfl:    lenalidomide (REVLIMID) 10 MG capsule, TAKE ONE CAPSULE BY MOUTH DAILY FOR 21 DAYS, THEN 7 DAYS OFF, Disp: 21 capsule, Rfl: 0   LORazepam (ATIVAN) 0.5 MG tablet, Take 1 tablet (0.5 mg total) by mouth at bedtime as needed for sleep or anxiety., Disp: 30 tablet, Rfl: 0   Magnesium 400 MG CAPS, Take by mouth., Disp: , Rfl:    GLYCINE PO, Take 500 mg by mouth daily. (Patient not taking: Reported on 04/18/2023), Disp: , Rfl:    ondansetron (ZOFRAN) 8 MG tablet, Take 1 tablet (8 mg total) by mouth every 8 (eight) hours as needed for nausea or vomiting. (Patient not taking: Reported on 04/18/2023), Disp: 30 tablet, Rfl: 1   prochlorperazine (COMPAZINE) 10 MG tablet, Take 1 tablet (10 mg total) by mouth every 6 (six) hours as needed for nausea or vomiting. (Patient not taking: Reported on 04/18/2023), Disp: 30 tablet, Rfl: 1   LABORATORY DATA:  I have reviewed the data as listed     Latest Ref Rng & Units 04/18/2023    2:34 PM 03/21/2023   11:24 AM 02/21/2023    1:37 PM  CBC  WBC 4.0 - 10.5 K/uL 3.8  4.7  3.4   Hemoglobin 12.0 - 15.0 g/dL 08.6  57.8  46.9   Hematocrit 36.0 - 46.0 % 39.0  41.4  36.4  Platelets 150 - 400 K/uL 270  316  255       Latest Ref Rng & Units 04/18/2023    2:26 PM 03/21/2023   11:44 AM 01/24/2023   10:23 AM  CMP  Glucose  70 - 99 mg/dL 95  161  096   BUN 8 - 23 mg/dL 16  16  20    Creatinine 0.44 - 1.00 mg/dL 0.45  4.09  8.11   Sodium 135 - 145 mmol/L 140  139  140   Potassium 3.5 - 5.1 mmol/L 3.9  4.4  4.0   Chloride 98 - 111 mmol/L 106  106  106   CO2 22 - 32 mmol/L 28  27  28    Calcium 8.9 - 10.3 mg/dL 9.8  91.4  9.2   Total Protein 6.5 - 8.1 g/dL 6.6  7.1  6.9   Total Bilirubin 0.3 - 1.2 mg/dL 1.1  0.6  1.0   Alkaline Phos 38 - 126 U/L 79  79  74   AST 15 - 41 U/L 17  16  26    ALT 0 - 44 U/L 19  21  36      10/10/2019 Flow Cytometry (Repeat BM Bx from [facility]):   10/07/18 Repeat BM Bx from [facility]:   Surgical Pathology                                Case: [id number]                               Authorizing Provider:  Rodney Langton, NP    Collected:           07/04/2022 1015             Ordering Location:     [facility] and   Received:            07/04/2022 1048                                    [facility]                                                   Pathologist:           Delilah Shan, MD                                                   Specimens:   A) - Bone Marrow, Aspirate                                                                        B) - Bone Marrow, Biopsy  C) - Bone Marrow, Clot                                                                 DIAGNOSIS   A-C. Peripheral blood and bone marrow (peripheral smear, aspirate, touch preparation, core biopsy, and clot section): - Low-level plasma cell neoplasm, see comment.   COMMENT: The core biopsy is 40% cellular. Cyclin-D1 immunohistochemical stain demonstrates very rare positive plasma cells (1%). Concurrent flow cytometry study ([id number]) shows a plasma cell population with an atypical immunophenotype, comprising approximately 0.04% of analyzed events.    Reviewed by resident/fellow Zachery Conch  Electronically  signed by Delilah Shan, MD on 07/10/2022 at 12:25 PM  Clinical Information   67 year old female with history of plasma cell myeloma.  Gross Examination   A. Peripheral blood and bone marrow labeled with patient's name and medical record number, one peripheral blood smear, three bone marrow aspirate smears and one bone marrow touch preparation are received.   B. "Bone marrow biopsy", in AZF.  Core biopsies of red-brown tissue 1.4 x 0.2 cm in aggregate are submitted in total as block B1 for decalcification.   C. "Bone marrow clot", in AZF.  A 1.8 x 1.8 x 0.3 cm aggregate of red-brown clot is submitted in total as block C1.    J.Elam/ Dr. Alvera Singh   Microscopic Examination        Lab Results  Component Value Date    WBC 5.2 07/04/2022    HGB 12.1 07/04/2022    PLT 364 07/04/2022    MCV 95 07/04/2022    RDWCV 14.6 (H) 07/04/2022    Peripheral blood and automated blood count:  RBCs: The red blood cells are adequate in number,  normocytic and normochromic, with minimal anisopoikilocytosis. WBCs: The white blood cells are adequate in number and show unremarkable morphology. Platelets: The platelets are adequate in number and show unremarkable morphology.   Bone marrow aspirate:  There are adequate cellular particles. M:E ratio is 3:1.  Granulopoiesis: Maturing myeloid elements are present and show complete maturation. Erythropoiesis:  Erythroid elements are present and show complete maturation. Megakaryocytes: Megakaryocytes are adequate in number and show unremarkable morphology.  There are slightly increased plasma cells with unremarkable morphology.   Bone marrow touch preparation: Adequate marrow elements are present, without additional findings to the aspirate smears.   Prussian blue stained aspirate smear: 0 of 4 stainable iron is present. No ringed sideroblasts are identified.   Bone marrow core biopsy:  The core biopsy is adequate for evaluation. The cellularity is  40%. Maturing myeloid elements are present and show complete maturation. Maturing erythroid elements are present and show complete maturation.  Megakaryocytes are adequate in number and show unremarkable morphology. There are slightly increased plasma cells.   A reticulin stain of the core biopsy shows no significant reticulin fibrosis.   Aspirate clot section: There are adequate particles present, without additional findings to the core biopsy.   Immunohistochemical stains for Cyclin-D1, CD138, Kappa CISH, and Lambda CISH are performed on the core biopsy section (block B1). CD138, kappa, and lambda in-situ hybridization demonstrates 5-10% predominantly polytypic plasma cells. Cyclin-D1 demonstrates very rare positive plasma cells (1%).  Bone Marrow Differential   500 CELL DIFF   Cell Type Cell Count Reference Range  Cell Type Cell Count Reference Range Cell Type Cell Count Reference Range  Segs 23 (7-25) Lymphocytes 15 (3-20) Plasma Cells 8 (0-3.5)  Bands 3 (6-36)     Atypicals 0   RBC Precursors 22 (10-30)  Metas 2 (9-25) Lymphoblasts *   Pronormoblasts 0 (0-3)  Myelos 5 (8-15) Eosinophils 12 (0-4) M:E Ratio 3/1    Promyelos 1 (1-6) Basophils 0 (0-1) Iron 0 (Scale 0-4+)  Myeloblasts 1 (0-3.5) Monocytes 8 (0-2)                                                                                                     Performed by:    Maryclare Bean                                                                                                         July 04, 2022 12:21 PM  Additional Documentation   All immunohistochemistry, in situ hybridization tests and special stains performed at [facility] and reported herein were developed, validated and their performance characteristics determined by the [facility]. During the performance of these tests, appropriate positive and negative control slides are also performed and reviewed. All control slides and internal  controls (when applicable) demonstrate the expected immunoreactive patterns and/or nucleic acid hybridization. These ancillary studies were deemed medically necessary by the requesting pathologist. They were ordered following review of the H&E and clinical history except when part of a liver/kidney protocol or where clinical history (e.g. immunocompromised, critically ill, history of malignancy) clearly indicates. Some of the tests may not be cleared or approved by the U.S. Food and Drug Administration (FDA).  The FDA has determined that such clearance or approval is not necessary.  These tests are used for clinical purposes and should not be regarded as investigational or as research.  This laboratory is certified under the Clinical Laboratory Improvement Amendments of 1988 (CLIA) as qualified to perform high complexity clinical testing. At least one block in this case underwent decalcification during its processing. The vast majority of immunohistochemical and hybridization assays were not validated on decalcified tissues. The results were interpreted with caution given the possibility of false negative results on decalcified specimens.  Attestation   All of the diagnostic evaluations on the enumerated specimens have been personally conducted by the pathologists involved in the care of this patient as indicated by the electronic signatures above.  Resulting Agency [facility] SURGICAL PATHOLOGY AND CYTOPATHOLOGY   Specimen Collected: 07/04/22 10:15   Performed by: [facility] SURGICAL PATHOLOGY AND CYTOPATHOLOGY Last Resulted: 07/10/22 12:25  Received From: [facility]  Result Received: 07/14/22 09:58   View Encounter  Received Information Pathology - Bone Marrow (Order [id number])    suggestion  Information displayed in this report may not trend or trigger automated decision support.    Pathology - Bone Marrow Order: [id number] Component 4 wk ago  Case Report Surgical Pathology                                 Case: [id number]                               Authorizing Provider:  Rodney Langton, NP    Collected:           07/04/2022 1015             Ordering Location:     [facility]   Received:            07/04/2022 1048                                    [facility]                                                   Pathologist:           Delilah Shan, MD                                                   Specimens:   A) - Bone Marrow, Aspirate                                                                        B) - Bone Marrow, Biopsy                                                                          C) - Bone Marrow, Clot                                                                DIAGNOSIS   A-C. Peripheral blood and bone marrow (peripheral smear, aspirate, touch preparation, core biopsy, and clot section): - Low-level plasma cell neoplasm, see comment.   COMMENT: The core biopsy is 40% cellular. Cyclin-D1 immunohistochemical stain demonstrates very rare positive plasma cells (1%). Concurrent flow cytometry study ([id number]) shows a  plasma cell population with an atypical immunophenotype, comprising approximately 0.04% of analyzed events.    Reviewed by resident/fellow Zachery Conch  Electronically signed by Delilah Shan, MD on 07/10/2022 at 12:25 PM  Clinical Information   67 year old female with history of plasma cell myeloma.  Gross Examination   A. Peripheral blood and bone marrow labeled with patient's name and medical record number, one peripheral blood smear, three bone marrow aspirate smears and one bone marrow touch preparation are received.   B. "Bone marrow biopsy", in AZF.  Core biopsies of red-brown tissue 1.4 x 0.2 cm in aggregate are submitted in total as block B1 for decalcification.   C. "Bone marrow clot", in AZF.  A 1.8 x 1.8 x 0.3 cm aggregate of red-brown clot is submitted in total as block C1.     J.Elam/ Dr. Alvera Singh     RADIOGRAPHIC STUDIES: I have personally reviewed the radiological images as listed and agreed with the findings in the report. No results found.  ASSESSMENT & PLAN:   67 y.o. caucasian female with  1. H/o Multiple myeloma - currently in remission.  But bone marrow biopsy shows she has turned MRD positive  2. IgA lambda R-ISS stage II multiple myeloma with innumerable lytic lesions in the calvarium, lesion in T7 vertebra and multiple lesions in the pelvis.  No significant bone pain at this time. Diagnosed in January 2017.  s/p treatment as noted above 10/07/18 BM Bx and Flow cytometry indicate that the pt continues to be in CR 10/10/2019 Flow Cytometry revealed "A. Bone Marrow (Plasma Cell Myeloma Minimal Residual Disease Detection by Flow Cytometry): Negative. No phenotypically abnormal plasma cells at or above the limit of detection identified."  PLAN:  -Labs from today were reviewed. WBC 3.8, Hgb 13.3, MCV 92.2, Plt 270. Creatinine, Calcium and LFTs normal.  -Proceed with daratumumab infusion as planned without any dose modifications. Continue on Revlimid without any dose modifications.  -Continue acyclovir for VZV prophylaxis -Continue aspirin for VTE prophylaxis. -Patient has follow-up with Dr. Kizzie Furnish at [facility] in November 2024 with BMBx prior.   FOLLOW UP: Per integrated scheduling  All of the patient's questions were answered with apparent satisfaction. The patient knows to call the clinic with any problems, questions or concerns.  I have spent a total of 30 minutes minutes of face-to-face and non-face-to-face time, preparing to see the patient, performing a medically appropriate examination, counseling and educating the patient,  documenting clinical information in the electronic health record,  and care coordination.   Georga Kaufmann PA-C Dept of Hematology and Oncology [facility] at [facility] Phone: [phone]

## 2023-05-11 ENCOUNTER — Other Ambulatory Visit: Payer: Self-pay

## 2023-05-11 DIAGNOSIS — C9001 Multiple myeloma in remission: Secondary | ICD-10-CM

## 2023-05-11 MED ORDER — LENALIDOMIDE 10 MG PO CAPS
ORAL_CAPSULE | ORAL | 0 refills | Status: DC
Start: 2023-05-11 — End: 2023-06-11

## 2023-05-15 ENCOUNTER — Other Ambulatory Visit: Payer: Self-pay

## 2023-05-15 ENCOUNTER — Inpatient Hospital Stay: Payer: Medicare Other

## 2023-05-15 ENCOUNTER — Inpatient Hospital Stay (HOSPITAL_BASED_OUTPATIENT_CLINIC_OR_DEPARTMENT_OTHER): Payer: Medicare Other | Admitting: Hematology

## 2023-05-15 ENCOUNTER — Inpatient Hospital Stay: Payer: Medicare Other | Attending: Hematology

## 2023-05-15 VITALS — BP 122/70 | HR 88 | Temp 98.2°F | Resp 17

## 2023-05-15 DIAGNOSIS — C9001 Multiple myeloma in remission: Secondary | ICD-10-CM

## 2023-05-15 DIAGNOSIS — Z79899 Other long term (current) drug therapy: Secondary | ICD-10-CM | POA: Diagnosis not present

## 2023-05-15 DIAGNOSIS — Z5112 Encounter for antineoplastic immunotherapy: Secondary | ICD-10-CM | POA: Insufficient documentation

## 2023-05-15 LAB — CBC WITH DIFFERENTIAL (CANCER CENTER ONLY)
Abs Immature Granulocytes: 0.01 10*3/uL (ref 0.00–0.07)
Basophils Absolute: 0 10*3/uL (ref 0.0–0.1)
Basophils Relative: 1 %
Eosinophils Absolute: 0 10*3/uL (ref 0.0–0.5)
Eosinophils Relative: 0 %
HCT: 42 % (ref 36.0–46.0)
Hemoglobin: 13.8 g/dL (ref 12.0–15.0)
Immature Granulocytes: 0 %
Lymphocytes Relative: 6 %
Lymphs Abs: 0.2 10*3/uL — ABNORMAL LOW (ref 0.7–4.0)
MCH: 31.1 pg (ref 26.0–34.0)
MCHC: 32.9 g/dL (ref 30.0–36.0)
MCV: 94.6 fL (ref 80.0–100.0)
Monocytes Absolute: 0.1 10*3/uL (ref 0.1–1.0)
Monocytes Relative: 3 %
Neutro Abs: 3.5 10*3/uL (ref 1.7–7.7)
Neutrophils Relative %: 90 %
Platelet Count: 260 10*3/uL (ref 150–400)
RBC: 4.44 MIL/uL (ref 3.87–5.11)
RDW: 14.6 % (ref 11.5–15.5)
WBC Count: 3.9 10*3/uL — ABNORMAL LOW (ref 4.0–10.5)
nRBC: 0 % (ref 0.0–0.2)

## 2023-05-15 MED ORDER — DIPHENHYDRAMINE HCL 25 MG PO CAPS
25.0000 mg | ORAL_CAPSULE | Freq: Once | ORAL | Status: AC
  Administered 2023-05-15: 25 mg via ORAL
  Filled 2023-05-15: qty 1

## 2023-05-15 MED ORDER — ACETAMINOPHEN 325 MG PO TABS
650.0000 mg | ORAL_TABLET | Freq: Once | ORAL | Status: AC
  Administered 2023-05-15: 650 mg via ORAL
  Filled 2023-05-15: qty 2

## 2023-05-15 MED ORDER — DARATUMUMAB-HYALURONIDASE-FIHJ 1800-30000 MG-UT/15ML ~~LOC~~ SOLN
1800.0000 mg | Freq: Once | SUBCUTANEOUS | Status: AC
  Administered 2023-05-15: 1800 mg via SUBCUTANEOUS
  Filled 2023-05-15: qty 15

## 2023-05-15 MED ORDER — FAMOTIDINE 20 MG PO TABS
20.0000 mg | ORAL_TABLET | Freq: Once | ORAL | Status: AC
  Administered 2023-05-15: 20 mg via ORAL
  Filled 2023-05-15: qty 1

## 2023-05-15 NOTE — Progress Notes (Signed)
HEMATOLOGY/ONCOLOGY CLINIC NOTE  Date of Service: 05/15/23   PCP: Tally Joe, MD Transplant Oncologist -- Dr Eddie Candle MD  CHIEF COMPLAINT: Follow-up for continued evaluation and management of multiple myeloma  DIAGNOSIS: IgA lambda R-ISS stage II multiple myeloma with innumerable lytic lesions in the calvarium, lesion in T7 vertebra and multiple lesions in the pelvis.  Diagnosed in January 2017.   Current treatment: Revlimid 10mg  po daily 3 weeks on 1 week off (after treatment interruption) Daratumumab added 11/15 for loss of MRD negative status   Previous Treatment: Status post Vd x 1 cycle VRd x 6 cycles   HD Melphalan 200mg /m2 on 03/22/2016 Autologous HSCT on 03/23/2016 with Dr Donnie Coffin at [facility] (Dose of 6.4 x10^6/kg)   INTERVAL HISTORY:   Paula Johnson is a 67 y.o. female is here for continued evaluation and management of multiple myeloma and Daratumumab cycle 9 day 1.   Patient was last seen by PA Thayil on 04/18/2023 and she was doing well overall.   Patient notes she has been doing well overall without any new or severe medical concerns since our last visit. She denies any recent infection issues, fever, chills, night sweats, abnormal bowel movement, abdominal pain, chest pain, back pain, or leg swelling.   She has been eating well, staying well hydrated, and staying physically active.   Patient is regularly taking Dexamethasone 8 mg before receiving her treatment.   Patient has been tolerating her treatment well without any new or severe toxicities.    REVIEW OF SYSTEMS:    10 Point review of Systems was done is negative except as noted above.   PHYSICAL EXAMINATION: .LMP 09/26/1995 (Within Months)  GENERAL:alert, in no acute distress and comfortable SKIN: no acute rashes, no significant lesions EYES: conjunctiva are pink and non-injected, sclera anicteric OROPHARYNX: MMM, no exudates, no oropharyngeal erythema or ulceration NECK: supple, no  JVD LYMPH:  no palpable lymphadenopathy in the cervical, axillary or inguinal regions LUNGS: clear to auscultation b/l with normal respiratory effort HEART: regular rate & rhythm ABDOMEN:  normoactive bowel sounds , non tender, not distended. Extremity: no pedal edema PSYCH: alert & oriented x 3 with fluent speech NEURO: no focal motor/sensory deficits   MEDICAL HISTORY:   Past Medical History:  Diagnosis Date   Abnormal Pap smear of vagina 03/22/2017   ASCUS pap and negative HR HPV.  Patient is on immunosuppressive medication.    Allergic reaction to bee sting    Anemia    AR (aortic regurgitation) 11/30/2017   trace noted on ECHO   Atrial septal aneurysm per 11-30-17 echo   trivial pericardial effusion   Complicated migraine    Constipation    DDD (degenerative disc disease), cervical    DDD (degenerative disc disease), lumbosacral    Fatty liver    Female proctocele without uterine prolapse    Fibroid    reason for Hysterectomy   FTT (failure to thrive) in adult 08/09/2017   Gastroesophageal reflux disease without esophagitis 01/17/2017   Generalized anxiety disorder    Grade I diastolic dysfunction    Hammer toe 05/13/2018   Hematuria 11/08/2015   History of COVID-19 12/2020   History of stress incontinence    Hypergammaglobulinemia    Hyperlipidemia    Hypoxemia 12/16/2007   Insomnia    Leg length discrepancy    Loss of appetite    Lung nodule    Malignant neoplasm of thymus (HCC) 12/03/2007   Metastatic multiple myeloma to bone (HCC) 10/19/2015  Metatarsalgia of right foot 07/15/2018   MR (mitral regurgitation) 11/30/2017   trace noted on ECHO   Muscular fasciculation    Nasal septal deviation    Nasal turbinate hypertrophy    Near syncope    Obstructive sleep apnea 12/03/2007   Other fatigue    Peripheral edema    Plasma cell dyscrasia    PONV (postoperative nausea and vomiting)    thinks morphine caused nausea   Postoperative urinary retention  05/14/2018   Prediabetes    Pure hypercholesterolemia    Rash 11/01/2015   Restless leg syndrome    S/P autologous bone marrow transplantation (HCC) 03/29/2016   Shortness of breath 12/04/2007   TR (tricuspid regurgitation) 11/30/2017   trace noted on ECHO   Urinary incontinence    UTI (urinary tract infection)    Vaginal granulation tissue 12/03/2017   Vitamin D deficiency    Wears glasses     SURGICAL HISTORY: Past Surgical History:  Procedure Laterality Date   ABDOMINAL HYSTERECTOMY  1997   TAH--ovaries remain--Dr. Tracey Harries   ANTERIOR AND POSTERIOR REPAIR N/A 05/14/2018   Procedure: ANTERIOR (CYSTOCELE) AND POSTERIOR REPAIR (RECTOCELE);  Surgeon: Patton Salles, MD;  Location: [facility];  Service: Gynecology;  Laterality: N/A;   BLADDER SUSPENSION N/A 05/14/2018   Procedure: TRANSVAGINAL TAPE (TVT) PROCEDURE exact midurethral sling;  Surgeon: Patton Salles, MD;  Location: [facility];  Service: Gynecology;  Laterality: N/A;   BONE MARROW TRANSPLANT  2017   done at [facility]   BREAST BIOPSY     COLONOSCOPY     CYSTOSCOPY N/A 05/14/2018   Procedure: CYSTOSCOPY;  Surgeon: Patton Salles, MD;  Location: [facility];  Service: Gynecology;  Laterality: N/A;   CYSTOSCOPY N/A 05/30/2018   Procedure: CYSTOSCOPY;  Surgeon: Patton Salles, MD;  Location[facility];  Service: Gynecology;  Laterality: N/A;   ROBOTIC ASSISTED LAPAROSCOPIC SACROCOLPOPEXY N/A 05/14/2018   Procedure: XI ROBOTIC ASSISTED LAPAROSCOPIC SACROCOLPOPEXY;  Surgeon: Patton Salles, MD;  Location: [facility];  Service: Gynecology;  Laterality: N/A;  6 hours OR time. Need extended stay recovery bed.   SALPINGOOPHORECTOMY Bilateral 05/14/2018   Procedure: SALPINGO OOPHORECTOMY;  Surgeon: Patton Salles, MD;  Location: [facility];  Service: Gynecology;  Laterality: Bilateral;   THYMECTOMY  1999   TRANSVAGINAL TAPE (TVT) REMOVAL N/A 05/30/2018   Procedure:  TRANSVAGINAL TAPE (TVT)  Revision;  Surgeon: Patton Salles, MD;  Location: [facility];  Service: Gynecology;  Laterality: N/A;   WISDOM TOOTH EXTRACTION      SOCIAL HISTORY: Social History   Socioeconomic History   Marital status: Married    Spouse name: Leonette Most   Number of children: 2   Years of education: 16   Highest education level: Bachelor's degree (e.g., BA, AB, BS)  Occupational History   Occupation: Retired    Comment: Engineer, 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  Tobacco Use   Smoking status: Never   Smokeless tobacco: Never  Vaping Use   Vaping status: Never Used  Substance and Sexual Activity   Alcohol use: Not Currently    Alcohol/week: 0.0 standard drinks of alcohol   Drug use: Never   Sexual activity: Not Currently    Partners: Male    Birth control/protection: Surgical    Comment: TAH--ovaries remain  Other Topics Concern   Not on file  Social History Narrative   Lives with husband  Caffeine- coffee, 2 cups daily   Right handed   Social Determinants of Health   Financial Resource Strain: Not on file  Food Insecurity: Not on file  Transportation Needs: Not on file  Physical Activity: Not on file  Stress: Not on file  Social Connections: Not on file  Intimate Partner Violence: Not on file    FAMILY HISTORY: Family History  Problem Relation Age of Onset   Hyperlipidemia Mother    Memory loss Mother        63s; believed to be age-related memory decline   COPD Father        dec age 41/s-smoked   Macular degeneration Father    Behavior problems Father        at the end of life   Diabetes Brother    Macular degeneration Brother    Suicidality Brother    Diabetes Maternal Grandmother    Heart attack Paternal Grandfather     ALLERGIES:  is allergic to ampicillin and penicillins.  MEDICATIONS:  . Current Outpatient Medications:    acyclovir (ZOVIRAX) 400 MG tablet, Take 1 tablet (400 mg total) by mouth 2 (two) times daily., Disp:  60 tablet, Rfl: 11   Ascorbic Acid (VITAMIN C) 1000 MG tablet, Take 1,000 mg by mouth 2 (two) times daily., Disp: , Rfl:    aspirin EC 81 MG tablet, Take 81 mg by mouth daily., Disp: , Rfl:    b complex vitamins capsule, Take 1 capsule by mouth daily., Disp: , Rfl:    Cholecalciferol (VITAMIN D3) 30 MCG/15ML LIQD, Take by mouth. Pt. Takes 5000 IU daily, Disp: , Rfl:    Cyanocobalamin (B-12) 1000 MCG CAPS, , Disp: , Rfl:    dexamethasone (DECADRON) 4 MG tablet, Take 2 tablets (8 mg) the day of chemo treatment. (every 14 days), Disp: 20 tablet, Rfl: 2   EPINEPHrine 0.3 mg/0.3 mL IJ SOAJ injection, INJECT INTRAMUSCULARLLY AS DIRECTED AS NEEDED FOR ANAPHYLAXIS, Disp: , Rfl:    estradiol (ESTRACE) 0.1 MG/GM vaginal cream, INSERT  1 GRAM VAGINALLY TWICE A WEEK AT BEDTIME, Disp: 43 g, Rfl: 0   Iron, Ferrous Sulfate, 325 (65 Fe) MG TABS, Take 325 mg by mouth daily., Disp: , Rfl:    lenalidomide (REVLIMID) 10 MG capsule, TAKE ONE CAPSULE BY MOUTH DAILY FOR 21 DAYS, THEN 7 DAYS OFF, Disp: 21 capsule, Rfl: 0   LORazepam (ATIVAN) 0.5 MG tablet, Take 1 tablet (0.5 mg total) by mouth at bedtime as needed for sleep or anxiety., Disp: 30 tablet, Rfl: 0   Magnesium 400 MG CAPS, Take by mouth., Disp: , Rfl:    ondansetron (ZOFRAN) 8 MG tablet, Take 1 tablet (8 mg total) by mouth every 8 (eight) hours as needed for nausea or vomiting. (Patient not taking: Reported on 04/18/2023), Disp: 30 tablet, Rfl: 1   prochlorperazine (COMPAZINE) 10 MG tablet, Take 1 tablet (10 mg total) by mouth every 6 (six) hours as needed for nausea or vomiting. (Patient not taking: Reported on 04/18/2023), Disp: 30 tablet, Rfl: 1   LABORATORY DATA:  I have reviewed the data as listed     Latest Ref Rng & Units 04/18/2023    2:34 PM 03/21/2023   11:24 AM 02/21/2023    1:37 PM  CBC  WBC 4.0 - 10.5 K/uL 3.8  4.7  3.4   Hemoglobin 12.0 - 15.0 g/dL 47.8  29.5  62.1   Hematocrit 36.0 - 46.0 % 39.0  41.4  36.4   Platelets 150 -  400 K/uL 270   316  255       Latest Ref Rng & Units 04/18/2023    2:26 PM 03/21/2023   11:44 AM 01/24/2023   10:23 AM  CMP  Glucose 70 - 99 mg/dL 95  161  096   BUN 8 - 23 mg/dL 16  16  20    Creatinine 0.44 - 1.00 mg/dL 0.45  4.09  8.11   Sodium 135 - 145 mmol/L 140  139  140   Potassium 3.5 - 5.1 mmol/L 3.9  4.4  4.0   Chloride 98 - 111 mmol/L 106  106  106   CO2 22 - 32 mmol/L 28  27  28    Calcium 8.9 - 10.3 mg/dL 9.8  91.4  9.2   Total Protein 6.5 - 8.1 g/dL 6.6  7.1  6.9   Total Bilirubin 0.3 - 1.2 mg/dL 1.1  0.6  1.0   Alkaline Phos 38 - 126 U/L 79  79  74   AST 15 - 41 U/L 17  16  26    ALT 0 - 44 U/L 19  21  36      01/10/2023 Bone Marrow Biopsy:    10/10/2019 Flow Cytometry (Repeat BM Bx from [facility]):   10/07/18 Repeat BM Bx from [facility]:   Surgical Pathology                                Case: [id number]                               Authorizing Provider:  Rodney Langton, NP    Collected:           07/04/2022 1015             Ordering Location:     [facility] Adult Blood and   Received:            07/04/2022 1048                                    [facility]                                                   Pathologist:           Delilah Shan, MD                                                   Specimens:   A) - Bone Marrow, Aspirate                                                                        B) - Bone Marrow, Biopsy  C) - Bone Marrow, Clot                                                                 DIAGNOSIS   A-C. Peripheral blood and bone marrow (peripheral smear, aspirate, touch preparation, core biopsy, and clot section): - Low-level plasma cell neoplasm, see comment.   COMMENT: The core biopsy is 40% cellular. Cyclin-D1 immunohistochemical stain demonstrates very rare positive plasma cells (1%). Concurrent flow cytometry study ([id number]) shows a plasma cell  population with an atypical immunophenotype, comprising approximately 0.04% of analyzed events.    Reviewed by resident/fellow Zachery Conch  Electronically signed by Delilah Shan, MD on 07/10/2022 at 12:25 PM  Clinical Information   67 year old female with history of plasma cell myeloma.  Gross Examination   A. Peripheral blood and bone marrow labeled with patient's name and medical record number, one peripheral blood smear, three bone marrow aspirate smears and one bone marrow touch preparation are received.   B. "Bone marrow biopsy", in AZF.  Core biopsies of red-brown tissue 1.4 x 0.2 cm in aggregate are submitted in total as block B1 for decalcification.   C. "Bone marrow clot", in AZF.  A 1.8 x 1.8 x 0.3 cm aggregate of red-brown clot is submitted in total as block C1.    J.Elam/ Dr. Alvera Singh   Microscopic Examination        Lab Results  Component Value Date    WBC 5.2 07/04/2022    HGB 12.1 07/04/2022    PLT 364 07/04/2022    MCV 95 07/04/2022    RDWCV 14.6 (H) 07/04/2022    Peripheral blood and automated blood count:  RBCs: The red blood cells are adequate in number,  normocytic and normochromic, with minimal anisopoikilocytosis. WBCs: The white blood cells are adequate in number and show unremarkable morphology. Platelets: The platelets are adequate in number and show unremarkable morphology.   Bone marrow aspirate:  There are adequate cellular particles. M:E ratio is 3:1.  Granulopoiesis: Maturing myeloid elements are present and show complete maturation. Erythropoiesis:  Erythroid elements are present and show complete maturation. Megakaryocytes: Megakaryocytes are adequate in number and show unremarkable morphology.  There are slightly increased plasma cells with unremarkable morphology.   Bone marrow touch preparation: Adequate marrow elements are present, without additional findings to the aspirate smears.   Prussian blue stained aspirate smear: 0 of 4  stainable iron is present. No ringed sideroblasts are identified.   Bone marrow core biopsy:  The core biopsy is adequate for evaluation. The cellularity is 40%. Maturing myeloid elements are present and show complete maturation. Maturing erythroid elements are present and show complete maturation.  Megakaryocytes are adequate in number and show unremarkable morphology. There are slightly increased plasma cells.   A reticulin stain of the core biopsy shows no significant reticulin fibrosis.   Aspirate clot section: There are adequate particles present, without additional findings to the core biopsy.   Immunohistochemical stains for Cyclin-D1, CD138, Kappa CISH, and Lambda CISH are performed on the core biopsy section (block B1). CD138, kappa, and lambda in-situ hybridization demonstrates 5-10% predominantly polytypic plasma cells. Cyclin-D1 demonstrates very rare positive plasma cells (1%).  Bone Marrow Differential   500 CELL DIFF   Cell Type Cell Count Reference Range  Cell Type Cell Count Reference Range Cell Type Cell Count Reference Range  Segs 23 (7-25) Lymphocytes 15 (3-20) Plasma Cells 8 (0-3.5)  Bands 3 (6-36)     Atypicals 0   RBC Precursors 22 (10-30)  Metas 2 (9-25) Lymphoblasts *   Pronormoblasts 0 (0-3)  Myelos 5 (8-15) Eosinophils 12 (0-4) M:E Ratio 3/1    Promyelos 1 (1-6) Basophils 0 (0-1) Iron 0 (Scale 0-4+)  Myeloblasts 1 (0-3.5) Monocytes 8 (0-2)                                                                                                     Performed by:    Maryclare Bean                                                                                                         July 04, 2022 12:21 PM  Additional Documentation   All immunohistochemistry, in situ hybridization tests and special stains performed at [facility] and reported herein were developed, validated and their performance characteristics determined by the [facility] Clinical  Laboratories. During the performance of these tests, appropriate positive and negative control slides are also performed and reviewed. All control slides and internal controls (when applicable) demonstrate the expected immunoreactive patterns and/or nucleic acid hybridization. These ancillary studies were deemed medically necessary by the requesting pathologist. They were ordered following review of the H&E and clinical history except when part of a liver/kidney protocol or where clinical history (e.g. immunocompromised, critically ill, history of malignancy) clearly indicates. Some of the tests may not be cleared or approved by the U.S. Food and Drug Administration (FDA).  The FDA has determined that such clearance or approval is not necessary.  These tests are used for clinical purposes and should not be regarded as investigational or as research.  This laboratory is certified under the Clinical Laboratory Improvement Amendments of 1988 (CLIA) as qualified to perform high complexity clinical testing. At least one block in this case underwent decalcification during its processing. The vast majority of immunohistochemical and hybridization assays were not validated on decalcified tissues. The results were interpreted with caution given the possibility of false negative results on decalcified specimens.  Attestation   All of the diagnostic evaluations on the enumerated specimens have been personally conducted by the pathologists involved in the care of this patient as indicated by the electronic signatures above.  Resulting Agency [facility] SURGICAL PATHOLOGY AND CYTOPATHOLOGY   Specimen Collected: 07/04/22 10:15   Performed by: [facility] SURGICAL PATHOLOGY AND CYTOPATHOLOGY Last Resulted: 07/10/22 12:25  Received From: [facility]  Result Received: 07/14/22 09:58   View Encounter  Received Information Pathology - Bone Marrow (Order [id number])    suggestion  Information displayed in this  report may not trend or trigger automated decision support.    Pathology - Bone Marrow Order: [id number] Component 4 wk ago  Case Report Surgical Pathology                                Case: [id number]                               Authorizing Provider:  Rodney Langton, NP    Collected:           07/04/2022 1015             Ordering Location:     [facility] and   Received:            07/04/2022 1048                                    [facility]                                                   Pathologist:           Delilah Shan, MD                                                   Specimens:   A) - Bone Marrow, Aspirate                                                                        B) - Bone Marrow, Biopsy                                                                          C) - Bone Marrow, Clot                                                                DIAGNOSIS   A-C. Peripheral blood and bone marrow (peripheral smear, aspirate, touch preparation, core biopsy, and clot section): - Low-level plasma cell neoplasm, see comment.   COMMENT: The core biopsy is 40% cellular. Cyclin-D1 immunohistochemical stain demonstrates very rare positive plasma cells (1%). Concurrent flow cytometry study ([id number]) shows a  plasma cell population with an atypical immunophenotype, comprising approximately 0.04% of analyzed events.    Reviewed by resident/fellow Zachery Conch  Electronically signed by Delilah Shan, MD on 07/10/2022 at 12:25 PM  Clinical Information   67 year old female with history of plasma cell myeloma.  Gross Examination   A. Peripheral blood and bone marrow labeled with patient's name and medical record number, one peripheral blood smear, three bone marrow aspirate smears and one bone marrow touch preparation are received.   B. "Bone marrow biopsy", in AZF.  Core biopsies of red-brown tissue 1.4 x 0.2 cm in aggregate  are submitted in total as block B1 for decalcification.   C. "Bone marrow clot", in AZF.  A 1.8 x 1.8 x 0.3 cm aggregate of red-brown clot is submitted in total as block C1.    J.Elam/ Dr. Alvera Singh     RADIOGRAPHIC STUDIES: I have personally reviewed the radiological images as listed and agreed with the findings in the report. No results found.  ASSESSMENT & PLAN:   67 y.o. caucasian female with  1. H/o Multiple myeloma - currently in remission.  But bone marrow biopsy shows she has turned MRD positive  2. IgA lambda R-ISS stage II multiple myeloma with innumerable lytic lesions in the calvarium, lesion in T7 vertebra and multiple lesions in the pelvis.  No significant bone pain at this time. Diagnosed in January 2017.  s/p treatment as noted above 10/07/18 BM Bx and Flow cytometry indicate that the pt continues to be in CR 10/10/2019 Flow Cytometry revealed "A. Bone Marrow (Plasma Cell Myeloma Minimal Residual Disease Detection by Flow Cytometry): Negative. No phenotypically abnormal plasma cells at or above the limit of detection identified."  PLAN:  -Discussed lab results from today, 05/15/2023, with the patient. CBC is stable. CMP is pending.  -Cut pre-treatment steroids to 4 mg.  -No new toxicities from her Daratumumab every 4 weeks and maintenance Revlimid.  -continue Dara every 4 weeks -Continue acyclovir for VZV prophylaxis -Continue aspirin for VTE prophylaxis. -reasonable to optimize iron level with ferrous sulfate 65 MG -continue maintence Revlimid and once a month Daratumumab -continue once a month darzalex   FOLLOW-UP:  Per integrated scheduling for Dara MD visit in 2 months  The total time spent in the appointment was 20 minutes* .  All of the patient's questions were answered with apparent satisfaction. The patient knows to call the clinic with any problems, questions or concerns.   Wyvonnia Lora MD MS AAHIVMS [facility] Hematology/Oncology Physician [facility]  .*Total Encounter Time as defined by the Centers for Medicare and Medicaid Services includes, in addition to the face-to-face time of a patient visit (documented in the note above) non-face-to-face time: obtaining and reviewing outside history, ordering and reviewing medications, tests or procedures, care coordination (communications with other health care professionals or caregivers) and documentation in the medical record.   I,Param Shah,acting as a Neurosurgeon for Wyvonnia Lora, MD.,have documented all relevant documentation on the behalf of Wyvonnia Lora, MD,as directed by  Wyvonnia Lora, MD while in the presence of Wyvonnia Lora, MD.   .I have reviewed the above documentation for accuracy and completeness, and I agree with the above. Johney Maine MD

## 2023-05-15 NOTE — Progress Notes (Signed)
Patient seen by Dr. Addison Naegeli are within treatment parameters.  Labs reviewed: and are within treatment parameters./ CMP processing  Per physician team, patient is ready for treatment and there are NO modifications to the treatment plan.

## 2023-05-17 ENCOUNTER — Other Ambulatory Visit: Payer: Self-pay

## 2023-05-20 LAB — MULTIPLE MYELOMA PANEL, SERUM
Albumin SerPl Elph-Mcnc: 3.8 g/dL (ref 2.9–4.4)
Albumin/Glob SerPl: 1.5 (ref 0.7–1.7)
Alpha 1: 0.2 g/dL (ref 0.0–0.4)
Alpha2 Glob SerPl Elph-Mcnc: 0.8 g/dL (ref 0.4–1.0)
B-Globulin SerPl Elph-Mcnc: 1 g/dL (ref 0.7–1.3)
Gamma Glob SerPl Elph-Mcnc: 0.6 g/dL (ref 0.4–1.8)
Globulin, Total: 2.6 g/dL (ref 2.2–3.9)
IgA: 130 mg/dL (ref 87–352)
IgG (Immunoglobin G), Serum: 681 mg/dL (ref 586–1602)
IgM (Immunoglobulin M), Srm: 29 mg/dL (ref 26–217)
Total Protein ELP: 6.4 g/dL (ref 6.0–8.5)

## 2023-05-21 ENCOUNTER — Encounter: Payer: Self-pay | Admitting: Hematology

## 2023-05-30 ENCOUNTER — Other Ambulatory Visit: Payer: Self-pay

## 2023-06-11 ENCOUNTER — Other Ambulatory Visit: Payer: Self-pay

## 2023-06-11 DIAGNOSIS — C9001 Multiple myeloma in remission: Secondary | ICD-10-CM

## 2023-06-11 MED ORDER — LENALIDOMIDE 10 MG PO CAPS: ORAL_CAPSULE | ORAL | 0 refills | Status: DC

## 2023-06-13 ENCOUNTER — Inpatient Hospital Stay: Payer: Medicare Other | Attending: Hematology

## 2023-06-13 ENCOUNTER — Inpatient Hospital Stay: Payer: Medicare Other | Admitting: Hematology

## 2023-06-13 ENCOUNTER — Inpatient Hospital Stay: Payer: Medicare Other

## 2023-06-13 ENCOUNTER — Encounter: Payer: Self-pay | Admitting: Hematology

## 2023-06-13 DIAGNOSIS — Z79899 Other long term (current) drug therapy: Secondary | ICD-10-CM | POA: Diagnosis not present

## 2023-06-13 DIAGNOSIS — Z5112 Encounter for antineoplastic immunotherapy: Secondary | ICD-10-CM | POA: Insufficient documentation

## 2023-06-13 DIAGNOSIS — C9001 Multiple myeloma in remission: Secondary | ICD-10-CM

## 2023-06-13 LAB — CBC WITH DIFFERENTIAL (CANCER CENTER ONLY)
Abs Immature Granulocytes: 0.01 10*3/uL (ref 0.00–0.07)
Basophils Absolute: 0 10*3/uL (ref 0.0–0.1)
Basophils Relative: 1 %
Eosinophils Absolute: 0 10*3/uL (ref 0.0–0.5)
Eosinophils Relative: 0 %
HCT: 42.7 % (ref 36.0–46.0)
Hemoglobin: 14.2 g/dL (ref 12.0–15.0)
Immature Granulocytes: 0 %
Lymphocytes Relative: 7 %
Lymphs Abs: 0.3 10*3/uL — ABNORMAL LOW (ref 0.7–4.0)
MCH: 31.6 pg (ref 26.0–34.0)
MCHC: 33.3 g/dL (ref 30.0–36.0)
MCV: 94.9 fL (ref 80.0–100.0)
Monocytes Absolute: 0.2 10*3/uL (ref 0.1–1.0)
Monocytes Relative: 5 %
Neutro Abs: 3.4 10*3/uL (ref 1.7–7.7)
Neutrophils Relative %: 87 %
Platelet Count: 264 10*3/uL (ref 150–400)
RBC: 4.5 MIL/uL (ref 3.87–5.11)
RDW: 14.6 % (ref 11.5–15.5)
WBC Count: 4 10*3/uL (ref 4.0–10.5)
nRBC: 0 % (ref 0.0–0.2)

## 2023-06-13 LAB — IRON AND IRON BINDING CAPACITY (CC-WL,HP ONLY)
Iron: 84 ug/dL (ref 28–170)
Saturation Ratios: 20 % (ref 10.4–31.8)
TIBC: 428 ug/dL (ref 250–450)
UIBC: 344 ug/dL (ref 148–442)

## 2023-06-13 LAB — COMPREHENSIVE METABOLIC PANEL WITH GFR
ALT: 26 U/L (ref 0–44)
AST: 20 U/L (ref 15–41)
Albumin: 4.4 g/dL (ref 3.5–5.0)
Alkaline Phosphatase: 77 U/L (ref 38–126)
Anion gap: 6 (ref 5–15)
BUN: 22 mg/dL (ref 8–23)
CO2: 28 mmol/L (ref 22–32)
Calcium: 9.5 mg/dL (ref 8.9–10.3)
Chloride: 104 mmol/L (ref 98–111)
Creatinine, Ser: 0.87 mg/dL (ref 0.44–1.00)
GFR, Estimated: >60 mL/min (ref >60)
Glucose, Bld: 189 mg/dL — ABNORMAL HIGH (ref 70–99)
Potassium: 4.4 mmol/L (ref 3.5–5.1)
Sodium: 138 mmol/L (ref 135–145)
Total Bilirubin: 0.8 mg/dL (ref 0.3–1.2)
Total Protein: 7.1 g/dL (ref 6.5–8.1)

## 2023-06-13 LAB — FERRITIN: Ferritin: 41 ng/mL (ref 11–307)

## 2023-06-13 MED ORDER — DARATUMUMAB-HYALURONIDASE-FIHJ 1800-30000 MG-UT/15ML ~~LOC~~ SOLN
1800.0000 mg | Freq: Once | SUBCUTANEOUS | Status: AC
  Administered 2023-06-13: 1800 mg via SUBCUTANEOUS
  Filled 2023-06-13: qty 15

## 2023-06-13 MED ORDER — ACETAMINOPHEN 325 MG PO TABS
650.0000 mg | ORAL_TABLET | Freq: Once | ORAL | Status: AC
  Administered 2023-06-13: 650 mg via ORAL
  Filled 2023-06-13: qty 2

## 2023-06-13 MED ORDER — FAMOTIDINE 20 MG PO TABS
20.0000 mg | ORAL_TABLET | Freq: Once | ORAL | Status: AC
  Administered 2023-06-13: 20 mg via ORAL
  Filled 2023-06-13: qty 1

## 2023-06-13 MED ORDER — DIPHENHYDRAMINE HCL 25 MG PO CAPS
25.0000 mg | ORAL_CAPSULE | Freq: Once | ORAL | Status: AC
  Administered 2023-06-13: 25 mg via ORAL
  Filled 2023-06-13: qty 1

## 2023-06-13 NOTE — Progress Notes (Signed)
Patient states she took 8mg  Dexamethasone today prior to treatment.

## 2023-06-13 NOTE — Patient Instructions (Signed)
[facility]  Discharge Instructions: Thank you for choosing [facility] to provide your oncology and hematology care.   If you have a lab appointment with the Cancer Center, please go directly to the Cancer Center and check in at the registration area.   Wear comfortable clothing and clothing appropriate for easy access to any Portacath or PICC line.   We strive to give you quality time with your provider. You may need to reschedule your appointment if you arrive late (15 or more minutes).  Arriving late affects you and other patients whose appointments are after yours.  Also, if you miss three or more appointments without notifying the office, you may be dismissed from the clinic at the provider's discretion.      For prescription refill requests, have your pharmacy contact our office and allow 72 hours for refills to be completed.    Today you received the following chemotherapy and/or immunotherapy agents Darzalex Faspro      To help prevent nausea and vomiting after your treatment, we encourage you to take your nausea medication as directed.  BELOW ARE SYMPTOMS THAT SHOULD BE REPORTED IMMEDIATELY: *FEVER GREATER THAN 100.4 F (38 C) OR HIGHER *CHILLS OR SWEATING *NAUSEA AND VOMITING THAT IS NOT CONTROLLED WITH YOUR NAUSEA MEDICATION *UNUSUAL SHORTNESS OF BREATH *UNUSUAL BRUISING OR BLEEDING *URINARY PROBLEMS (pain or burning when urinating, or frequent urination) *BOWEL PROBLEMS (unusual diarrhea, constipation, pain near the anus) TENDERNESS IN MOUTH AND THROAT WITH OR WITHOUT PRESENCE OF ULCERS (sore throat, sores in mouth, or a toothache) UNUSUAL RASH, SWELLING OR PAIN  UNUSUAL VAGINAL DISCHARGE OR ITCHING   Items with * indicate a potential emergency and should be followed up as soon as possible or go to the Emergency Department if any problems should occur.  Please show the CHEMOTHERAPY ALERT CARD or IMMUNOTHERAPY ALERT CARD at  check-in to the Emergency Department and triage nurse.  Should you have questions after your visit or need to cancel or reschedule your appointment, please contact [facility]  Dept: [phone]  and follow the prompts.  Office hours are 8:00 a.m. to 4:30 p.m. Monday - Friday. Please note that voicemails left after 4:00 p.m. may not be returned until the following business day.  We are closed weekends and major holidays. You have access to a nurse at all times for urgent questions. Please call the main number to the clinic Dept: [phone] and follow the prompts.   For any non-urgent questions, you may also contact your provider using MyChart. We now offer e-Visits for anyone 18 and older to request care online for non-urgent symptoms. For details visit [email or web address].   Also download the MyChart app! Go to the app store, search "MyChart", open the app, select [facility], and log in with your MyChart username and password.  

## 2023-06-17 LAB — MULTIPLE MYELOMA PANEL, SERUM
Albumin SerPl Elph-Mcnc: 3.9 g/dL (ref 2.9–4.4)
Albumin/Glob SerPl: 1.5 (ref 0.7–1.7)
Alpha 1: 0.3 g/dL (ref 0.0–0.4)
Alpha2 Glob SerPl Elph-Mcnc: 0.8 g/dL (ref 0.4–1.0)
B-Globulin SerPl Elph-Mcnc: 1 g/dL (ref 0.7–1.3)
Gamma Glob SerPl Elph-Mcnc: 0.7 g/dL (ref 0.4–1.8)
Globulin, Total: 2.7 g/dL (ref 2.2–3.9)
IgA: 129 mg/dL (ref 87–352)
IgG (Immunoglobin G), Serum: 738 mg/dL (ref 586–1602)
IgM (Immunoglobulin M), Srm: 27 mg/dL (ref 26–217)
Total Protein ELP: 6.6 g/dL (ref 6.0–8.5)

## 2023-07-01 ENCOUNTER — Encounter: Payer: Self-pay | Admitting: Hematology

## 2023-07-01 NOTE — Progress Notes (Signed)
This encounter was created in error - please disregard.

## 2023-07-02 ENCOUNTER — Other Ambulatory Visit: Payer: Self-pay | Admitting: Obstetrics and Gynecology

## 2023-07-02 DIAGNOSIS — N952 Postmenopausal atrophic vaginitis: Secondary | ICD-10-CM

## 2023-07-03 NOTE — Telephone Encounter (Signed)
Medication refill request: estrace vaginal cream Last AEX:  03-08-22 Next AEX: 09-25-23 Last MMG (if hormonal medication request): 01-25-23 category b density birads 1:neg Refill authorized: please approve if appropriate

## 2023-07-04 ENCOUNTER — Other Ambulatory Visit: Payer: Self-pay

## 2023-07-04 DIAGNOSIS — C9001 Multiple myeloma in remission: Secondary | ICD-10-CM

## 2023-07-04 MED ORDER — LENALIDOMIDE 10 MG PO CAPS: ORAL_CAPSULE | ORAL | 0 refills | Status: DC

## 2023-07-10 ENCOUNTER — Encounter: Payer: Self-pay | Admitting: Hematology

## 2023-07-11 ENCOUNTER — Inpatient Hospital Stay: Payer: Medicare Other | Admitting: Hematology

## 2023-07-11 ENCOUNTER — Inpatient Hospital Stay: Payer: Medicare Other | Attending: Hematology

## 2023-07-11 ENCOUNTER — Other Ambulatory Visit: Payer: Self-pay

## 2023-07-11 ENCOUNTER — Inpatient Hospital Stay: Payer: Medicare Other

## 2023-07-11 VITALS — BP 132/80 | HR 85 | Temp 97.7°F | Resp 20 | Wt 130.8 lb

## 2023-07-11 DIAGNOSIS — Z5112 Encounter for antineoplastic immunotherapy: Secondary | ICD-10-CM | POA: Diagnosis present

## 2023-07-11 DIAGNOSIS — C9001 Multiple myeloma in remission: Secondary | ICD-10-CM

## 2023-07-11 DIAGNOSIS — Z79899 Other long term (current) drug therapy: Secondary | ICD-10-CM | POA: Insufficient documentation

## 2023-07-11 LAB — CBC WITH DIFFERENTIAL (CANCER CENTER ONLY)
Abs Immature Granulocytes: 0.01 10*3/uL (ref 0.00–0.07)
Basophils Absolute: 0 10*3/uL (ref 0.0–0.1)
Basophils Relative: 1 %
Eosinophils Absolute: 0 10*3/uL (ref 0.0–0.5)
Eosinophils Relative: 1 %
HCT: 41.9 % (ref 36.0–46.0)
Hemoglobin: 14.1 g/dL (ref 12.0–15.0)
Immature Granulocytes: 0 %
Lymphocytes Relative: 6 %
Lymphs Abs: 0.2 10*3/uL — ABNORMAL LOW (ref 0.7–4.0)
MCH: 31.9 pg (ref 26.0–34.0)
MCHC: 33.7 g/dL (ref 30.0–36.0)
MCV: 94.8 fL (ref 80.0–100.0)
Monocytes Absolute: 0.1 10*3/uL (ref 0.1–1.0)
Monocytes Relative: 3 %
Neutro Abs: 3.7 10*3/uL (ref 1.7–7.7)
Neutrophils Relative %: 89 %
Platelet Count: 294 10*3/uL (ref 150–400)
RBC: 4.42 MIL/uL (ref 3.87–5.11)
RDW: 13.9 % (ref 11.5–15.5)
WBC Count: 4.1 10*3/uL (ref 4.0–10.5)
nRBC: 0 % (ref 0.0–0.2)

## 2023-07-11 LAB — CMP (CANCER CENTER ONLY)
ALT: 28 U/L (ref 0–44)
AST: 22 U/L (ref 15–41)
Albumin: 4.1 g/dL (ref 3.5–5.0)
Alkaline Phosphatase: 71 U/L (ref 38–126)
Anion gap: 9 (ref 5–15)
BUN: 17 mg/dL (ref 8–23)
CO2: 27 mmol/L (ref 22–32)
Calcium: 9.1 mg/dL (ref 8.9–10.3)
Chloride: 102 mmol/L (ref 98–111)
Creatinine: 0.86 mg/dL (ref 0.44–1.00)
GFR, Estimated: >60 mL/min (ref >60)
Glucose, Bld: 184 mg/dL — ABNORMAL HIGH (ref 70–99)
Potassium: 3.8 mmol/L (ref 3.5–5.1)
Sodium: 138 mmol/L (ref 135–145)
Total Bilirubin: 1.1 mg/dL (ref 0.3–1.2)
Total Protein: 7.1 g/dL (ref 6.5–8.1)

## 2023-07-11 MED ORDER — DIPHENHYDRAMINE HCL 25 MG PO CAPS
25.0000 mg | ORAL_CAPSULE | Freq: Once | ORAL | Status: AC
  Administered 2023-07-11: 25 mg via ORAL
  Filled 2023-07-11: qty 1

## 2023-07-11 MED ORDER — ACETAMINOPHEN 325 MG PO TABS
650.0000 mg | ORAL_TABLET | Freq: Once | ORAL | Status: AC
  Administered 2023-07-11: 650 mg via ORAL
  Filled 2023-07-11: qty 2

## 2023-07-11 MED ORDER — FAMOTIDINE 20 MG PO TABS
20.0000 mg | ORAL_TABLET | Freq: Once | ORAL | Status: AC
  Administered 2023-07-11: 20 mg via ORAL
  Filled 2023-07-11: qty 1

## 2023-07-11 MED ORDER — DARATUMUMAB-HYALURONIDASE-FIHJ 1800-30000 MG-UT/15ML ~~LOC~~ SOLN
1800.0000 mg | Freq: Once | SUBCUTANEOUS | Status: AC
  Administered 2023-07-11: 1800 mg via SUBCUTANEOUS
  Filled 2023-07-11: qty 15

## 2023-07-11 NOTE — Progress Notes (Signed)
HEMATOLOGY/ONCOLOGY CLINIC NOTE  Date of Service: 07/11/23   PCP: Tally Joe, MD Transplant Oncologist -- Dr Eddie Candle MD  CHIEF COMPLAINT: Follow-up for continued evaluation and management of multiple myeloma  DIAGNOSIS: IgA lambda R-ISS stage II multiple myeloma with innumerable lytic lesions in the calvarium, lesion in T7 vertebra and multiple lesions in the pelvis.  Diagnosed in January 2017.   Current treatment: Revlimid 10mg  po daily 3 weeks on 1 week off (after treatment interruption) Daratumumab added 11/15 for loss of MRD negative status   Previous Treatment: Status post Vd x 1 cycle VRd x 6 cycles   HD Melphalan 200mg /m2 on 03/22/2016 Autologous HSCT on 03/23/2016 with Dr Donnie Coffin at [facility] (Dose of 6.4 x10^6/kg)   INTERVAL HISTORY:   Paula Johnson is a 67 y.o. female is here for continued evaluation and management of multiple myeloma and Daratumumab cycle 13 day 1.   Patient was last seen by me on 05/15/2023 and was doing well overall with no new medical concerns.Today, she complains of occasional hot flashes beginning a couple months ago. Her episodes occur in short bursts a couple times a week. Her hot flash episodes occur during the night and day. She denies any new medication changes. She continues to use Estrace vaginal cream regularly with no change in dose. She did not notice hot flashes with steroids. Patient denies any allergies. She does not take any decongestives or allergy medications. She reports having chronic congestion with post nasal drift.   She reports that she did receive the COVID-19 and flu shot recently. Patient notes that she endorsed hot flashes prior to receiving vaccines.   She reports endorsing intermittent diarrhea from Revlimid, but has no other issues tolerating the medication. Patient denies any sniffles, cough, fever, infections, chest pain, SOB, leg swelling, or new bone pain.   She reports that her thyroid levels were  recently checked and were normal.  She reports a history of hysterectomy and notes that she went through menopause within the next 6 months. She has had her ovaries removed within the last 5 years.   Patient has an upcoming bone marrow biopsy on July 31, 2013. She reports endorsing severe pain with her last bone marrow biopsy.   REVIEW OF SYSTEMS:    10 Point review of Systems was done is negative except as noted above.   PHYSICAL EXAMINATION: .LMP 09/26/1995 (Within Months)    GENERAL:alert, in no acute distress and comfortable SKIN: no acute rashes, no significant lesions EYES: conjunctiva are pink and non-injected, sclera anicteric OROPHARYNX: MMM, no exudates, no oropharyngeal erythema or ulceration NECK: supple, no JVD LYMPH:  no palpable lymphadenopathy in the cervical, axillary or inguinal regions LUNGS: clear to auscultation b/l with normal respiratory effort HEART: regular rate & rhythm ABDOMEN:  normoactive bowel sounds , non tender, not distended. Extremity: no pedal edema PSYCH: alert & oriented x 3 with fluent speech NEURO: no focal motor/sensory deficits   MEDICAL HISTORY:   Past Medical History:  Diagnosis Date   Abnormal Pap smear of vagina 03/22/2017   ASCUS pap and negative HR HPV.  Patient is on immunosuppressive medication.    Allergic reaction to bee sting    Anemia    AR (aortic regurgitation) 11/30/2017   trace noted on ECHO   Atrial septal aneurysm per 11-30-17 echo   trivial pericardial effusion   Complicated migraine    Constipation    DDD (degenerative disc disease), cervical    DDD (degenerative disc disease), lumbosacral  Fatty liver    Female proctocele without uterine prolapse    Fibroid    reason for Hysterectomy   FTT (failure to thrive) in adult 08/09/2017   Gastroesophageal reflux disease without esophagitis 01/17/2017   Generalized anxiety disorder    Grade I diastolic dysfunction    Hammer toe 05/13/2018   Hematuria  11/08/2015   History of COVID-19 12/2020   History of stress incontinence    Hypergammaglobulinemia    Hyperlipidemia    Hypoxemia 12/16/2007   Insomnia    Leg length discrepancy    Loss of appetite    Lung nodule    Malignant neoplasm of thymus (HCC) 12/03/2007   Metastatic multiple myeloma to bone (HCC) 10/19/2015   Metatarsalgia of right foot 07/15/2018   MR (mitral regurgitation) 11/30/2017   trace noted on ECHO   Muscular fasciculation    Nasal septal deviation    Nasal turbinate hypertrophy    Near syncope    Obstructive sleep apnea 12/03/2007   Other fatigue    Peripheral edema    Plasma cell dyscrasia    PONV (postoperative nausea and vomiting)    thinks morphine caused nausea   Postoperative urinary retention 05/14/2018   Prediabetes    Pure hypercholesterolemia    Rash 11/01/2015   Restless leg syndrome    S/P autologous bone marrow transplantation (HCC) 03/29/2016   Shortness of breath 12/04/2007   TR (tricuspid regurgitation) 11/30/2017   trace noted on ECHO   Urinary incontinence    UTI (urinary tract infection)    Vaginal granulation tissue 12/03/2017   Vitamin D deficiency    Wears glasses     SURGICAL HISTORY: Past Surgical History:  Procedure Laterality Date   ABDOMINAL HYSTERECTOMY  1997   TAH--ovaries remain--Dr. Tracey Harries   ANTERIOR AND POSTERIOR REPAIR N/A 05/14/2018   Procedure: ANTERIOR (CYSTOCELE) AND POSTERIOR REPAIR (RECTOCELE);  Surgeon: Patton Salles, MD;  Location: [facility];  Service: Gynecology;  Laterality: N/A;   BLADDER SUSPENSION N/A 05/14/2018   Procedure: TRANSVAGINAL TAPE (TVT) PROCEDURE exact midurethral sling;  Surgeon: Patton Salles, MD;  Location: [facility];  Service: Gynecology;  Laterality: N/A;   BONE MARROW TRANSPLANT  2017   done at [facility]   BREAST BIOPSY     COLONOSCOPY     CYSTOSCOPY N/A 05/14/2018   Procedure: CYSTOSCOPY;  Surgeon: Patton Salles, MD;  Location: [facility];  Service:  Gynecology;  Laterality: N/A;   CYSTOSCOPY N/A 05/30/2018   Procedure: CYSTOSCOPY;  Surgeon: Patton Salles, MD;  Location[facility];  Service: Gynecology;  Laterality: N/A;   ROBOTIC ASSISTED LAPAROSCOPIC SACROCOLPOPEXY N/A 05/14/2018   Procedure: XI ROBOTIC ASSISTED LAPAROSCOPIC SACROCOLPOPEXY;  Surgeon: Patton Salles, MD;  Location: [facility];  Service: Gynecology;  Laterality: N/A;  6 hours OR time. Need extended stay recovery bed.   SALPINGOOPHORECTOMY Bilateral 05/14/2018   Procedure: SALPINGO OOPHORECTOMY;  Surgeon: Patton Salles, MD;  Location: [facility];  Service: Gynecology;  Laterality: Bilateral;   THYMECTOMY  1999   TRANSVAGINAL TAPE (TVT) REMOVAL N/A 05/30/2018   Procedure: TRANSVAGINAL TAPE (TVT)  Revision;  Surgeon: Patton Salles, MD;  Location: [facility];  Service: Gynecology;  Laterality: N/A;   WISDOM TOOTH EXTRACTION      SOCIAL HISTORY: Social History   Socioeconomic History   Marital status: Married    Spouse name: Leonette Most   Number of  children: 2   Years of education: 16   Highest education level: Bachelor's degree (e.g., BA, AB, BS)  Occupational History   Occupation: Retired    Comment: Engineer, 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  Tobacco Use   Smoking status: Never   Smokeless tobacco: Never  Vaping Use   Vaping status: Never Used  Substance and Sexual Activity   Alcohol use: Not Currently    Alcohol/week: 0.0 standard drinks of alcohol   Drug use: Never   Sexual activity: Not Currently    Partners: Male    Birth control/protection: Surgical    Comment: TAH--ovaries remain  Other Topics Concern   Not on file  Social History Narrative   Lives with husband   Caffeine- coffee, 2 cups daily   Right handed   Social Determinants of Health   Financial Resource Strain: Not on file  Food Insecurity: Not on file  Transportation Needs: Not on file  Physical Activity: Not on file  Stress: Not on file   Social Connections: Not on file  Intimate Partner Violence: Not on file    FAMILY HISTORY: Family History  Problem Relation Age of Onset   Hyperlipidemia Mother    Memory loss Mother        90s; believed to be age-related memory decline   COPD Father        dec age 35/s-smoked   Macular degeneration Father    Behavior problems Father        at the end of life   Diabetes Brother    Macular degeneration Brother    Suicidality Brother    Diabetes Maternal Grandmother    Heart attack Paternal Grandfather     ALLERGIES:  is allergic to ampicillin and penicillins.  MEDICATIONS:  . Current Outpatient Medications:    acyclovir (ZOVIRAX) 400 MG tablet, Take 1 tablet (400 mg total) by mouth 2 (two) times daily., Disp: 60 tablet, Rfl: 11   Ascorbic Acid (VITAMIN C) 1000 MG tablet, Take 1,000 mg by mouth 2 (two) times daily., Disp: , Rfl:    aspirin EC 81 MG tablet, Take 81 mg by mouth daily., Disp: , Rfl:    b complex vitamins capsule, Take 1 capsule by mouth daily., Disp: , Rfl:    Cholecalciferol (VITAMIN D3) 30 MCG/15ML LIQD, Take by mouth. Pt. Takes 5000 IU daily, Disp: , Rfl:    Cyanocobalamin (B-12) 1000 MCG CAPS, , Disp: , Rfl:    dexamethasone (DECADRON) 4 MG tablet, Take 2 tablets (8 mg) the day of chemo treatment. (every 14 days), Disp: 20 tablet, Rfl: 2   EPINEPHrine 0.3 mg/0.3 mL IJ SOAJ injection, INJECT INTRAMUSCULARLLY AS DIRECTED AS NEEDED FOR ANAPHYLAXIS, Disp: , Rfl:    estradiol (ESTRACE) 0.1 MG/GM vaginal cream, INSERT 1 GRAM VAGINALLY TWICE A WEEK AT BEDTIME, Disp: 43 g, Rfl: 0   Iron, Ferrous Sulfate, 325 (65 Fe) MG TABS, Take 325 mg by mouth daily., Disp: , Rfl:    lenalidomide (REVLIMID) 10 MG capsule, TAKE ONE CAPSULE BY MOUTH DAILY FOR 21 DAYS, THEN 7 DAYS OFF, Disp: 21 capsule, Rfl: 0   LORazepam (ATIVAN) 0.5 MG tablet, Take 1 tablet (0.5 mg total) by mouth at bedtime as needed for sleep or anxiety., Disp: 30 tablet, Rfl: 0   Magnesium 400 MG CAPS, Take by  mouth., Disp: , Rfl:    Multiple Vitamins-Minerals (PRESERVISION AREDS PO), Take 1 capsule by mouth 2 (two) times daily., Disp: , Rfl:    ondansetron (ZOFRAN) 8 MG tablet, Take  1 tablet (8 mg total) by mouth every 8 (eight) hours as needed for nausea or vomiting. (Patient not taking: Reported on 04/18/2023), Disp: 30 tablet, Rfl: 1   prochlorperazine (COMPAZINE) 10 MG tablet, Take 1 tablet (10 mg total) by mouth every 6 (six) hours as needed for nausea or vomiting. (Patient not taking: Reported on 04/18/2023), Disp: 30 tablet, Rfl: 1   LABORATORY DATA:  I have reviewed the data as listed     Latest Ref Rng & Units 07/11/2023   11:14 AM 06/13/2023    2:24 PM 05/15/2023   11:18 AM  CBC  WBC 4.0 - 10.5 K/uL 4.1  4.0  3.9   Hemoglobin 12.0 - 15.0 g/dL 16.1  09.6  04.5   Hematocrit 36.0 - 46.0 % 41.9  42.7  42.0   Platelets 150 - 400 K/uL 294  264  260       Latest Ref Rng & Units 06/13/2023    2:24 PM 04/18/2023    2:26 PM 03/21/2023   11:44 AM  CMP  Glucose 70 - 99 mg/dL 409  95  811   BUN 8 - 23 mg/dL 22  16  16    Creatinine 0.44 - 1.00 mg/dL 9.14  7.82  9.56   Sodium 135 - 145 mmol/L 138  140  139   Potassium 3.5 - 5.1 mmol/L 4.4  3.9  4.4   Chloride 98 - 111 mmol/L 104  106  106   CO2 22 - 32 mmol/L 28  28  27    Calcium 8.9 - 10.3 mg/dL 9.5  9.8  21.3   Total Protein 6.5 - 8.1 g/dL 7.1  6.6  7.1   Total Bilirubin 0.3 - 1.2 mg/dL 0.8  1.1  0.6   Alkaline Phos 38 - 126 U/L 77  79  79   AST 15 - 41 U/L 20  17  16    ALT 0 - 44 U/L 26  19  21       01/10/2023 Bone Marrow Biopsy:    10/10/2019 Flow Cytometry (Repeat BM Bx from [facility]):   10/07/18 Repeat BM Bx from [facility]:   Surgical Pathology                                Case: [id number]                               Authorizing Provider:  Rodney Langton, NP    Collected:           07/04/2022 1015             Ordering Location:     [facility] and   Received:            07/04/2022 1048                                     [facility]                                                   Pathologist:           Delilah Shan, MD  Specimens:   A) - Bone Marrow, Aspirate                                                                        B) - Bone Marrow, Biopsy                                                                          C) - Bone Marrow, Clot                                                                 DIAGNOSIS   A-C. Peripheral blood and bone marrow (peripheral smear, aspirate, touch preparation, core biopsy, and clot section): - Low-level plasma cell neoplasm, see comment.   COMMENT: The core biopsy is 40% cellular. Cyclin-D1 immunohistochemical stain demonstrates very rare positive plasma cells (1%). Concurrent flow cytometry study ([id number]) shows a plasma cell population with an atypical immunophenotype, comprising approximately 0.04% of analyzed events.    Reviewed by resident/fellow Zachery Conch  Electronically signed by Delilah Shan, MD on 07/10/2022 at 12:25 PM  Clinical Information   67 year old female with history of plasma cell myeloma.  Gross Examination   A. Peripheral blood and bone marrow labeled with patient's name and medical record number, one peripheral blood smear, three bone marrow aspirate smears and one bone marrow touch preparation are received.   B. "Bone marrow biopsy", in AZF.  Core biopsies of red-brown tissue 1.4 x 0.2 cm in aggregate are submitted in total as block B1 for decalcification.   C. "Bone marrow clot", in AZF.  A 1.8 x 1.8 x 0.3 cm aggregate of red-brown clot is submitted in total as block C1.    J.Elam/ Dr. Alvera Singh   Microscopic Examination        Lab Results  Component Value Date    WBC 5.2 07/04/2022    HGB 12.1 07/04/2022    PLT 364 07/04/2022    MCV 95 07/04/2022    RDWCV 14.6 (H) 07/04/2022    Peripheral blood and automated blood count:  RBCs: The red  blood cells are adequate in number,  normocytic and normochromic, with minimal anisopoikilocytosis. WBCs: The white blood cells are adequate in number and show unremarkable morphology. Platelets: The platelets are adequate in number and show unremarkable morphology.   Bone marrow aspirate:  There are adequate cellular particles. M:E ratio is 3:1.  Granulopoiesis: Maturing myeloid elements are present and show complete maturation. Erythropoiesis:  Erythroid elements are present and show complete maturation. Megakaryocytes: Megakaryocytes are adequate in number and show unremarkable morphology.  There are slightly increased plasma cells with unremarkable morphology.   Bone marrow touch preparation: Adequate marrow elements are present, without additional findings to the aspirate smears.   Prussian blue  stained aspirate smear: 0 of 4 stainable iron is present. No ringed sideroblasts are identified.   Bone marrow core biopsy:  The core biopsy is adequate for evaluation. The cellularity is 40%. Maturing myeloid elements are present and show complete maturation. Maturing erythroid elements are present and show complete maturation.  Megakaryocytes are adequate in number and show unremarkable morphology. There are slightly increased plasma cells.   A reticulin stain of the core biopsy shows no significant reticulin fibrosis.   Aspirate clot section: There are adequate particles present, without additional findings to the core biopsy.   Immunohistochemical stains for Cyclin-D1, CD138, Kappa CISH, and Lambda CISH are performed on the core biopsy section (block B1). CD138, kappa, and lambda in-situ hybridization demonstrates 5-10% predominantly polytypic plasma cells. Cyclin-D1 demonstrates very rare positive plasma cells (1%).  Bone Marrow Differential   500 CELL DIFF   Cell Type Cell Count Reference Range Cell Type Cell Count Reference Range Cell Type Cell Count Reference Range  Segs 23 (7-25)  Lymphocytes 15 (3-20) Plasma Cells 8 (0-3.5)  Bands 3 (6-36)     Atypicals 0   RBC Precursors 22 (10-30)  Metas 2 (9-25) Lymphoblasts *   Pronormoblasts 0 (0-3)  Myelos 5 (8-15) Eosinophils 12 (0-4) M:E Ratio 3/1    Promyelos 1 (1-6) Basophils 0 (0-1) Iron 0 (Scale 0-4+)  Myeloblasts 1 (0-3.5) Monocytes 8 (0-2)                                                                                                     Performed by:    Maryclare Bean                                                                                                         July 04, 2022 12:21 PM  Additional Documentation   All immunohistochemistry, in situ hybridization tests and special stains performed at [facility] and reported herein were developed, validated and their performance characteristics determined by the [facility]. During the performance of these tests, appropriate positive and negative control slides are also performed and reviewed. All control slides and internal controls (when applicable) demonstrate the expected immunoreactive patterns and/or nucleic acid hybridization. These ancillary studies were deemed medically necessary by the requesting pathologist. They were ordered following review of the H&E and clinical history except when part of a liver/kidney protocol or where clinical history (e.g. immunocompromised, critically ill, history of malignancy) clearly indicates. Some of the tests may not be cleared or approved by the U.S. Food and Drug Administration (FDA).  The FDA has determined that such clearance or approval is not necessary.  These tests are  used for clinical purposes and should not be regarded as investigational or as research.  This laboratory is certified under the Clinical Laboratory Improvement Amendments of 1988 (CLIA) as qualified to perform high complexity clinical testing. At least one block in this case underwent decalcification during its processing.  The vast majority of immunohistochemical and hybridization assays were not validated on decalcified tissues. The results were interpreted with caution given the possibility of false negative results on decalcified specimens.  Attestation   All of the diagnostic evaluations on the enumerated specimens have been personally conducted by the pathologists involved in the care of this patient as indicated by the electronic signatures above.  Resulting Agency [facility] SURGICAL PATHOLOGY AND CYTOPATHOLOGY   Specimen Collected: 07/04/22 10:15   Performed by: [facility] SURGICAL PATHOLOGY AND CYTOPATHOLOGY Last Resulted: 07/10/22 12:25  Received From: [facility]  Result Received: 07/14/22 09:58   View Encounter      Received Information Pathology - Bone Marrow (Order [id number])    suggestion  Information displayed in this report may not trend or trigger automated decision support.    Pathology - Bone Marrow Order: [id number] Component 4 wk ago  Case Report Surgical Pathology                                Case: [id number]                               Authorizing Provider:  Rodney Langton, NP    Collected:           07/04/2022 1015             Ordering Location:     [facility]   Received:            07/04/2022 1048                                    [facility]                                                   Pathologist:           Delilah Shan, MD                                                   Specimens:   A) - Bone Marrow, Aspirate                                                                        B) - Bone Marrow, Biopsy  C) - Bone Marrow, Clot                                                                DIAGNOSIS   A-C. Peripheral blood and bone marrow (peripheral smear, aspirate, touch preparation, core biopsy, and clot section): - Low-level plasma cell neoplasm,  see comment.   COMMENT: The core biopsy is 40% cellular. Cyclin-D1 immunohistochemical stain demonstrates very rare positive plasma cells (1%). Concurrent flow cytometry study ([id number]) shows a plasma cell population with an atypical immunophenotype, comprising approximately 0.04% of analyzed events.    Reviewed by resident/fellow Zachery Conch  Electronically signed by Delilah Shan, MD on 07/10/2022 at 12:25 PM  Clinical Information   67 year old female with history of plasma cell myeloma.  Gross Examination   A. Peripheral blood and bone marrow labeled with patient's name and medical record number, one peripheral blood smear, three bone marrow aspirate smears and one bone marrow touch preparation are received.   B. "Bone marrow biopsy", in AZF.  Core biopsies of red-brown tissue 1.4 x 0.2 cm in aggregate are submitted in total as block B1 for decalcification.   C. "Bone marrow clot", in AZF.  A 1.8 x 1.8 x 0.3 cm aggregate of red-brown clot is submitted in total as block C1.    J.Elam/ Dr. Alvera Singh     RADIOGRAPHIC STUDIES: I have personally reviewed the radiological images as listed and agreed with the findings in the report. No results found.  ASSESSMENT & PLAN:   67 y.o. caucasian female with  1. H/o Multiple myeloma - currently in remission.  But bone marrow biopsy shows she has turned MRD positive  2. IgA lambda R-ISS stage II multiple myeloma with innumerable lytic lesions in the calvarium, lesion in T7 vertebra and multiple lesions in the pelvis.  No significant bone pain at this time. Diagnosed in January 2017.  s/p treatment as noted above 10/07/18 BM Bx and Flow cytometry indicate that the pt continues to be in CR 10/10/2019 Flow Cytometry revealed "A. Bone Marrow (Plasma Cell Myeloma Minimal Residual Disease Detection by Flow Cytometry): Negative. No phenotypically abnormal plasma cells at or above the limit of detection identified."  PLAN:   -Discussed  lab results on 07/11/23 in detail with patient. CBC normal showed WBC of 4.1K, hemoglobin of 14.1, and platelets of 294K. -CMP stable -last myeloma panel from 4 weeks ago showed that her M protein continued to be undetectable. -today's myeloma panel is pending at the time of visit -Patient has been tolerating her treatment well without any new or severe toxicities.  -continue maintenance Revlimid and once a month daratumumab -okay to stop pre-treatment steroids -Continue acyclovir for VZV prophylaxis -Continue aspirin for VTE prophylaxis -hot flashes may be related to a medication side effect, though there is no obvious explanation. Recommend patient to stay hydrated, decrease caffeine intake, and optimize physical activity and sleep. Also discussed options including vitamin E 400 units daily and evening primrose tea -answered all of patient's questions in detail -patient has an upcoming bone marrow biopsy on July 31, 2013  FOLLOW-UP:  -continue monthly Dara as per integrated scheduling -Next Visit in 2 months.  The total time spent in the appointment was 21 minutes* .  All of the patient's questions were  answered with apparent satisfaction. The patient knows to call the clinic with any problems, questions or concerns.   Wyvonnia Lora MD MS AAHIVMS [facility] Hematology/Oncology Physician [facility]  .*Total Encounter Time as defined by the Centers for Medicare and Medicaid Services includes, in addition to the face-to-face time of a patient visit (documented in the note above) non-face-to-face time: obtaining and reviewing outside history, ordering and reviewing medications, tests or procedures, care coordination (communications with other health care professionals or caregivers) and documentation in the medical record.    I,Mitra Faeizi,acting as a Neurosurgeon for Wyvonnia Lora, MD.,have documented all relevant documentation on the behalf of Wyvonnia Lora, MD,as directed by  Wyvonnia Lora, MD while in the presence of Wyvonnia Lora, MD.  .I have reviewed the above documentation for accuracy and completeness, and I agree with the above. Johney Maine MD

## 2023-07-11 NOTE — Patient Instructions (Signed)
[facility]  Discharge Instructions: Thank you for choosing [facility] to provide your oncology and hematology care.   If you have a lab appointment with the Cancer Center, please go directly to the Cancer Center and check in at the registration area.   Wear comfortable clothing and clothing appropriate for easy access to any Portacath or PICC line.   We strive to give you quality time with your provider. You may need to reschedule your appointment if you arrive late (15 or more minutes).  Arriving late affects you and other patients whose appointments are after yours.  Also, if you miss three or more appointments without notifying the office, you may be dismissed from the clinic at the provider's discretion.      For prescription refill requests, have your pharmacy contact our office and allow 72 hours for refills to be completed.    Today you received the following chemotherapy and/or immunotherapy agents Darzalex Faspro      To help prevent nausea and vomiting after your treatment, we encourage you to take your nausea medication as directed.  BELOW ARE SYMPTOMS THAT SHOULD BE REPORTED IMMEDIATELY: *FEVER GREATER THAN 100.4 F (38 C) OR HIGHER *CHILLS OR SWEATING *NAUSEA AND VOMITING THAT IS NOT CONTROLLED WITH YOUR NAUSEA MEDICATION *UNUSUAL SHORTNESS OF BREATH *UNUSUAL BRUISING OR BLEEDING *URINARY PROBLEMS (pain or burning when urinating, or frequent urination) *BOWEL PROBLEMS (unusual diarrhea, constipation, pain near the anus) TENDERNESS IN MOUTH AND THROAT WITH OR WITHOUT PRESENCE OF ULCERS (sore throat, sores in mouth, or a toothache) UNUSUAL RASH, SWELLING OR PAIN  UNUSUAL VAGINAL DISCHARGE OR ITCHING   Items with * indicate a potential emergency and should be followed up as soon as possible or go to the Emergency Department if any problems should occur.  Please show the CHEMOTHERAPY ALERT CARD or IMMUNOTHERAPY ALERT CARD at  check-in to the Emergency Department and triage nurse.  Should you have questions after your visit or need to cancel or reschedule your appointment, please contact [facility]  Dept: [phone]  and follow the prompts.  Office hours are 8:00 a.m. to 4:30 p.m. Monday - Friday. Please note that voicemails left after 4:00 p.m. may not be returned until the following business day.  We are closed weekends and major holidays. You have access to a nurse at all times for urgent questions. Please call the main number to the clinic Dept: [phone] and follow the prompts.   For any non-urgent questions, you may also contact your provider using MyChart. We now offer e-Visits for anyone 18 and older to request care online for non-urgent symptoms. For details visit [email or web address].   Also download the MyChart app! Go to the app store, search "MyChart", open the app, select [facility], and log in with your MyChart username and password.  

## 2023-07-14 ENCOUNTER — Other Ambulatory Visit: Payer: Self-pay

## 2023-07-16 LAB — MULTIPLE MYELOMA PANEL, SERUM
Albumin SerPl Elph-Mcnc: 3.7 g/dL (ref 2.9–4.4)
Albumin/Glob SerPl: 1.4 (ref 0.7–1.7)
Alpha 1: 0.3 g/dL (ref 0.0–0.4)
Alpha2 Glob SerPl Elph-Mcnc: 0.8 g/dL (ref 0.4–1.0)
B-Globulin SerPl Elph-Mcnc: 1.1 g/dL (ref 0.7–1.3)
Gamma Glob SerPl Elph-Mcnc: 0.6 g/dL (ref 0.4–1.8)
Globulin, Total: 2.8 g/dL (ref 2.2–3.9)
IgA: 121 mg/dL (ref 87–352)
IgG (Immunoglobin G), Serum: 671 mg/dL (ref 586–1602)
IgM (Immunoglobulin M), Srm: 28 mg/dL (ref 26–217)
Total Protein ELP: 6.5 g/dL (ref 6.0–8.5)

## 2023-07-17 ENCOUNTER — Encounter: Payer: Self-pay | Admitting: Hematology

## 2023-07-30 ENCOUNTER — Ambulatory Visit: Payer: Medicare Other | Admitting: Obstetrics and Gynecology

## 2023-08-02 ENCOUNTER — Other Ambulatory Visit: Payer: Self-pay

## 2023-08-02 DIAGNOSIS — C9001 Multiple myeloma in remission: Secondary | ICD-10-CM

## 2023-08-02 MED ORDER — LENALIDOMIDE 10 MG PO CAPS: ORAL_CAPSULE | ORAL | 0 refills | Status: DC

## 2023-08-06 ENCOUNTER — Other Ambulatory Visit: Payer: Self-pay | Admitting: Hematology

## 2023-08-06 DIAGNOSIS — C9001 Multiple myeloma in remission: Secondary | ICD-10-CM

## 2023-08-08 ENCOUNTER — Inpatient Hospital Stay: Payer: Medicare Other | Attending: Hematology

## 2023-08-08 ENCOUNTER — Inpatient Hospital Stay: Payer: Medicare Other

## 2023-08-08 ENCOUNTER — Other Ambulatory Visit: Payer: Medicare Other

## 2023-08-08 ENCOUNTER — Ambulatory Visit: Payer: Medicare Other | Admitting: Hematology

## 2023-08-08 VITALS — BP 123/62 | HR 88 | Temp 98.1°F | Resp 13 | Wt 130.5 lb

## 2023-08-08 DIAGNOSIS — C9001 Multiple myeloma in remission: Secondary | ICD-10-CM | POA: Diagnosis present

## 2023-08-08 DIAGNOSIS — Z5112 Encounter for antineoplastic immunotherapy: Secondary | ICD-10-CM | POA: Insufficient documentation

## 2023-08-08 DIAGNOSIS — Z79899 Other long term (current) drug therapy: Secondary | ICD-10-CM | POA: Diagnosis not present

## 2023-08-08 LAB — CMP (CANCER CENTER ONLY)
ALT: 26 U/L (ref 0–44)
AST: 19 U/L (ref 15–41)
Albumin: 4.4 g/dL (ref 3.5–5.0)
Alkaline Phosphatase: 74 U/L (ref 38–126)
Anion gap: 5 (ref 5–15)
BUN: 16 mg/dL (ref 8–23)
CO2: 32 mmol/L (ref 22–32)
Calcium: 10 mg/dL (ref 8.9–10.3)
Chloride: 103 mmol/L (ref 98–111)
Creatinine: 1 mg/dL (ref 0.44–1.00)
GFR, Estimated: >60 mL/min (ref >60)
Glucose, Bld: 157 mg/dL — ABNORMAL HIGH (ref 70–99)
Potassium: 4.2 mmol/L (ref 3.5–5.1)
Sodium: 140 mmol/L (ref 135–145)
Total Bilirubin: 1.1 mg/dL (ref <1.2)
Total Protein: 7.1 g/dL (ref 6.5–8.1)

## 2023-08-08 LAB — CBC WITH DIFFERENTIAL (CANCER CENTER ONLY)
Abs Immature Granulocytes: 0.01 10*3/uL (ref 0.00–0.07)
Basophils Absolute: 0 10*3/uL (ref 0.0–0.1)
Basophils Relative: 1 %
Eosinophils Absolute: 0.2 10*3/uL (ref 0.0–0.5)
Eosinophils Relative: 6 %
HCT: 43.2 % (ref 36.0–46.0)
Hemoglobin: 14.3 g/dL (ref 12.0–15.0)
Immature Granulocytes: 0 %
Lymphocytes Relative: 23 %
Lymphs Abs: 0.8 10*3/uL (ref 0.7–4.0)
MCH: 31.5 pg (ref 26.0–34.0)
MCHC: 33.1 g/dL (ref 30.0–36.0)
MCV: 95.2 fL (ref 80.0–100.0)
Monocytes Absolute: 0.5 10*3/uL (ref 0.1–1.0)
Monocytes Relative: 13 %
Neutro Abs: 2 10*3/uL (ref 1.7–7.7)
Neutrophils Relative %: 57 %
Platelet Count: 246 10*3/uL (ref 150–400)
RBC: 4.54 MIL/uL (ref 3.87–5.11)
RDW: 14.1 % (ref 11.5–15.5)
WBC Count: 3.5 10*3/uL — ABNORMAL LOW (ref 4.0–10.5)
nRBC: 0 % (ref 0.0–0.2)

## 2023-08-08 MED ORDER — DARATUMUMAB-HYALURONIDASE-FIHJ 1800-30000 MG-UT/15ML ~~LOC~~ SOLN
1800.0000 mg | Freq: Once | SUBCUTANEOUS | Status: AC
  Administered 2023-08-08: 1800 mg via SUBCUTANEOUS
  Filled 2023-08-08: qty 15

## 2023-08-08 MED ORDER — DIPHENHYDRAMINE HCL 25 MG PO CAPS
25.0000 mg | ORAL_CAPSULE | Freq: Once | ORAL | Status: AC
  Administered 2023-08-08: 25 mg via ORAL
  Filled 2023-08-08: qty 1

## 2023-08-08 MED ORDER — FAMOTIDINE 20 MG PO TABS
20.0000 mg | ORAL_TABLET | Freq: Once | ORAL | Status: AC
  Administered 2023-08-08: 20 mg via ORAL
  Filled 2023-08-08: qty 1

## 2023-08-08 MED ORDER — ACETAMINOPHEN 325 MG PO TABS
650.0000 mg | ORAL_TABLET | Freq: Once | ORAL | Status: AC
  Administered 2023-08-08: 650 mg via ORAL
  Filled 2023-08-08: qty 2

## 2023-08-08 MED ORDER — FAMOTIDINE 20 MG PO TABS: 20.0000 mg | ORAL_TABLET | Freq: Once | ORAL | Status: DC

## 2023-08-08 NOTE — Patient Instructions (Signed)

## 2023-08-13 LAB — MULTIPLE MYELOMA PANEL, SERUM
Albumin SerPl Elph-Mcnc: 3.8 g/dL (ref 2.9–4.4)
Albumin/Glob SerPl: 1.6 (ref 0.7–1.7)
Alpha 1: 0.2 g/dL (ref 0.0–0.4)
Alpha2 Glob SerPl Elph-Mcnc: 0.7 g/dL (ref 0.4–1.0)
B-Globulin SerPl Elph-Mcnc: 0.9 g/dL (ref 0.7–1.3)
Gamma Glob SerPl Elph-Mcnc: 0.7 g/dL (ref 0.4–1.8)
Globulin, Total: 2.5 g/dL (ref 2.2–3.9)
IgA: 134 mg/dL (ref 87–352)
IgG (Immunoglobin G), Serum: 677 mg/dL (ref 586–1602)
IgM (Immunoglobulin M), Srm: 31 mg/dL (ref 26–217)
Total Protein ELP: 6.3 g/dL (ref 6.0–8.5)

## 2023-08-22 ENCOUNTER — Other Ambulatory Visit: Payer: Self-pay

## 2023-08-27 ENCOUNTER — Other Ambulatory Visit: Payer: Self-pay

## 2023-08-27 DIAGNOSIS — C9001 Multiple myeloma in remission: Secondary | ICD-10-CM

## 2023-08-27 MED ORDER — LENALIDOMIDE 10 MG PO CAPS: ORAL_CAPSULE | ORAL | 0 refills | Status: DC

## 2023-09-04 ENCOUNTER — Other Ambulatory Visit: Payer: Self-pay | Admitting: Hematology

## 2023-09-04 DIAGNOSIS — C9001 Multiple myeloma in remission: Secondary | ICD-10-CM

## 2023-09-05 ENCOUNTER — Encounter: Payer: Self-pay | Admitting: Hematology

## 2023-09-05 ENCOUNTER — Other Ambulatory Visit: Payer: Self-pay | Admitting: Hematology

## 2023-09-05 ENCOUNTER — Inpatient Hospital Stay (HOSPITAL_BASED_OUTPATIENT_CLINIC_OR_DEPARTMENT_OTHER): Payer: Medicare Other | Admitting: Hematology

## 2023-09-05 ENCOUNTER — Inpatient Hospital Stay: Payer: Medicare Other

## 2023-09-05 ENCOUNTER — Inpatient Hospital Stay: Payer: Medicare Other | Attending: Hematology

## 2023-09-05 VITALS — BP 122/60 | HR 80 | Temp 97.7°F | Resp 18 | Wt 132.6 lb

## 2023-09-05 DIAGNOSIS — Z5112 Encounter for antineoplastic immunotherapy: Secondary | ICD-10-CM | POA: Diagnosis present

## 2023-09-05 DIAGNOSIS — C9000 Multiple myeloma not having achieved remission: Secondary | ICD-10-CM | POA: Diagnosis present

## 2023-09-05 DIAGNOSIS — C9001 Multiple myeloma in remission: Secondary | ICD-10-CM

## 2023-09-05 DIAGNOSIS — Z79899 Other long term (current) drug therapy: Secondary | ICD-10-CM | POA: Diagnosis not present

## 2023-09-05 LAB — CBC WITH DIFFERENTIAL (CANCER CENTER ONLY)
Abs Immature Granulocytes: 0.01 10*3/uL (ref 0.00–0.07)
Basophils Absolute: 0.1 10*3/uL (ref 0.0–0.1)
Basophils Relative: 2 %
Eosinophils Absolute: 0.1 10*3/uL (ref 0.0–0.5)
Eosinophils Relative: 4 %
HCT: 39.7 % (ref 36.0–46.0)
Hemoglobin: 13.2 g/dL (ref 12.0–15.0)
Immature Granulocytes: 0 %
Lymphocytes Relative: 20 %
Lymphs Abs: 0.6 10*3/uL — ABNORMAL LOW (ref 0.7–4.0)
MCH: 31.4 pg (ref 26.0–34.0)
MCHC: 33.2 g/dL (ref 30.0–36.0)
MCV: 94.5 fL (ref 80.0–100.0)
Monocytes Absolute: 0.4 10*3/uL (ref 0.1–1.0)
Monocytes Relative: 13 %
Neutro Abs: 1.8 10*3/uL (ref 1.7–7.7)
Neutrophils Relative %: 61 %
Platelet Count: 232 10*3/uL (ref 150–400)
RBC: 4.2 MIL/uL (ref 3.87–5.11)
RDW: 14.2 % (ref 11.5–15.5)
WBC Count: 3 10*3/uL — ABNORMAL LOW (ref 4.0–10.5)
nRBC: 0 % (ref 0.0–0.2)

## 2023-09-05 LAB — CMP (CANCER CENTER ONLY)
ALT: 21 U/L (ref 0–44)
AST: 16 U/L (ref 15–41)
Albumin: 4.1 g/dL (ref 3.5–5.0)
Alkaline Phosphatase: 67 U/L (ref 38–126)
Anion gap: 5 (ref 5–15)
BUN: 18 mg/dL (ref 8–23)
CO2: 30 mmol/L (ref 22–32)
Calcium: 9.5 mg/dL (ref 8.9–10.3)
Chloride: 106 mmol/L (ref 98–111)
Creatinine: 0.84 mg/dL (ref 0.44–1.00)
GFR, Estimated: >60 mL/min (ref >60)
Glucose, Bld: 99 mg/dL (ref 70–99)
Potassium: 4.1 mmol/L (ref 3.5–5.1)
Sodium: 141 mmol/L (ref 135–145)
Total Bilirubin: 0.9 mg/dL (ref <1.2)
Total Protein: 6.4 g/dL — ABNORMAL LOW (ref 6.5–8.1)

## 2023-09-05 MED ORDER — FAMOTIDINE 20 MG PO TABS
20.0000 mg | ORAL_TABLET | Freq: Once | ORAL | Status: AC
  Administered 2023-09-05: 20 mg via ORAL
  Filled 2023-09-05: qty 1

## 2023-09-05 MED ORDER — DARATUMUMAB-HYALURONIDASE-FIHJ 1800-30000 MG-UT/15ML ~~LOC~~ SOLN
1800.0000 mg | Freq: Once | SUBCUTANEOUS | Status: AC
  Administered 2023-09-05: 1800 mg via SUBCUTANEOUS
  Filled 2023-09-05: qty 15

## 2023-09-05 MED ORDER — DIPHENHYDRAMINE HCL 25 MG PO CAPS
25.0000 mg | ORAL_CAPSULE | Freq: Once | ORAL | Status: AC
Start: 2023-09-05 — End: 2023-09-05
  Administered 2023-09-05: 25 mg via ORAL
  Filled 2023-09-05: qty 1

## 2023-09-05 MED ORDER — ACETAMINOPHEN 325 MG PO TABS
650.0000 mg | ORAL_TABLET | Freq: Once | ORAL | Status: AC
Start: 2023-09-05 — End: 2023-09-05
  Administered 2023-09-05: 650 mg via ORAL
  Filled 2023-09-05: qty 2

## 2023-09-05 NOTE — Progress Notes (Signed)
Patient states she no longer takes dexamethasone at home prior to Tx.

## 2023-09-05 NOTE — Progress Notes (Signed)
HEMATOLOGY/ONCOLOGY CLINIC NOTE  Date of Service: 09/05/23   PCP: Tally Joe, MD Transplant Oncologist -- Dr Eddie Candle MD  CHIEF COMPLAINT: Follow-up for continued evaluation and management of multiple myeloma  DIAGNOSIS: IgA lambda R-ISS stage II multiple myeloma with innumerable lytic lesions in the calvarium, lesion in T7 vertebra and multiple lesions in the pelvis.  Diagnosed in January 2017.   Current treatment: Revlimid 10mg  po daily 3 weeks on 1 week off (after treatment interruption) Daratumumab added 11/15 for loss of MRD negative status   Previous Treatment: Status post Vd x 1 cycle VRd x 6 cycles   HD Melphalan 200mg /m2 on 03/22/2016 Autologous HSCT on 03/23/2016 with Dr Donnie Coffin at [facility] (Dose of 6.4 x10^6/kg)   INTERVAL HISTORY:   Paula Johnson is a 68 y.o. female is here for continued evaluation and management of multiple myeloma and Daratumumab cycle 15 day 1.   Patient was last seen by me on 07/11/2023 and complained of occasional hot flashes, chronic congestion with post nasal drift, and intermittent diarrhea.   She was seen by Dr. Barbaraann Boys on 08/14/2023 and she was recommended to continue her current treatment plan. There were findings of 0.01% phenotypically abnormal plasma cells. No monotypic B-cell population was identified.   Today, she reports that she has been doing well overall since her last clinical visit.   She denies any infection issues, new bone pains, new skin rashes, back pain  Patient is UTD with her flu, RSV, and COVID-19 booster vaccines.   She reports no intolerances to any other new medications.   REVIEW OF SYSTEMS:    10 Point review of Systems was done is negative except as noted above.   PHYSICAL EXAMINATION: .BP 122/60   Pulse 80   Temp 97.7 F (36.5 C)   Resp 18   Wt 132 lb 9.6 oz (60.1 kg)   LMP 09/26/1995 (Within Months)   SpO2 99%   BMI 20.16 kg/m   GENERAL:alert, in no acute distress and  comfortable SKIN: no acute rashes, no significant lesions EYES: conjunctiva are pink and non-injected, sclera anicteric OROPHARYNX: MMM, no exudates, no oropharyngeal erythema or ulceration NECK: supple, no JVD LYMPH:  no palpable lymphadenopathy in the cervical, axillary or inguinal regions LUNGS: clear to auscultation b/l with normal respiratory effort HEART: regular rate & rhythm ABDOMEN:  normoactive bowel sounds , non tender, not distended. Extremity: no pedal edema PSYCH: alert & oriented x 3 with fluent speech NEURO: no focal motor/sensory deficits   MEDICAL HISTORY:   Past Medical History:  Diagnosis Date   Abnormal Pap smear of vagina 03/22/2017   ASCUS pap and negative HR HPV.  Patient is on immunosuppressive medication.    Allergic reaction to bee sting    Anemia    AR (aortic regurgitation) 11/30/2017   trace noted on ECHO   Atrial septal aneurysm per 11-30-17 echo   trivial pericardial effusion   Complicated migraine    Constipation    DDD (degenerative disc disease), cervical    DDD (degenerative disc disease), lumbosacral    Fatty liver    Female proctocele without uterine prolapse    Fibroid    reason for Hysterectomy   FTT (failure to thrive) in adult 08/09/2017   Gastroesophageal reflux disease without esophagitis 01/17/2017   Generalized anxiety disorder    Grade I diastolic dysfunction    Hammer toe 05/13/2018   Hematuria 11/08/2015   History of COVID-19 12/2020   History of stress incontinence  Hypergammaglobulinemia    Hyperlipidemia    Hypoxemia 12/16/2007   Insomnia    Leg length discrepancy    Loss of appetite    Lung nodule    Malignant neoplasm of thymus (HCC) 12/03/2007   Metastatic multiple myeloma to bone (HCC) 10/19/2015   Metatarsalgia of right foot 07/15/2018   MR (mitral regurgitation) 11/30/2017   trace noted on ECHO   Muscular fasciculation    Nasal septal deviation    Nasal turbinate hypertrophy    Near syncope     Obstructive sleep apnea 12/03/2007   Other fatigue    Peripheral edema    Plasma cell dyscrasia    PONV (postoperative nausea and vomiting)    thinks morphine caused nausea   Postoperative urinary retention 05/14/2018   Prediabetes    Pure hypercholesterolemia    Rash 11/01/2015   Restless leg syndrome    S/P autologous bone marrow transplantation (HCC) 03/29/2016   Shortness of breath 12/04/2007   TR (tricuspid regurgitation) 11/30/2017   trace noted on ECHO   Urinary incontinence    UTI (urinary tract infection)    Vaginal granulation tissue 12/03/2017   Vitamin D deficiency    Wears glasses     SURGICAL HISTORY: Past Surgical History:  Procedure Laterality Date   ABDOMINAL HYSTERECTOMY  1997   TAH--ovaries remain--Dr. Tracey Harries   ANTERIOR AND POSTERIOR REPAIR N/A 05/14/2018   Procedure: ANTERIOR (CYSTOCELE) AND POSTERIOR REPAIR (RECTOCELE);  Surgeon: Patton Salles, MD;  Location: [facility];  Service: Gynecology;  Laterality: N/A;   BLADDER SUSPENSION N/A 05/14/2018   Procedure: TRANSVAGINAL TAPE (TVT) PROCEDURE exact midurethral sling;  Surgeon: Patton Salles, MD;  Location: [facility];  Service: Gynecology;  Laterality: N/A;   BONE MARROW TRANSPLANT  2017   done at [facility]   BREAST BIOPSY     COLONOSCOPY     CYSTOSCOPY N/A 05/14/2018   Procedure: CYSTOSCOPY;  Surgeon: Patton Salles, MD;  Location: [facility];  Service: Gynecology;  Laterality: N/A;   CYSTOSCOPY N/A 05/30/2018   Procedure: CYSTOSCOPY;  Surgeon: Patton Salles, MD;  Location: [facility];  Service: Gynecology;  Laterality: N/A;   ROBOTIC ASSISTED LAPAROSCOPIC SACROCOLPOPEXY N/A 05/14/2018   Procedure: XI ROBOTIC ASSISTED LAPAROSCOPIC SACROCOLPOPEXY;  Surgeon: Patton Salles, MD;  Location: [facility];  Service: Gynecology;  Laterality: N/A;  6 hours OR time. Need extended stay recovery bed.   SALPINGOOPHORECTOMY Bilateral 05/14/2018   Procedure: SALPINGO  OOPHORECTOMY;  Surgeon: Patton Salles, MD;  Location: [facility];  Service: Gynecology;  Laterality: Bilateral;   THYMECTOMY  1999   TRANSVAGINAL TAPE (TVT) REMOVAL N/A 05/30/2018   Procedure: TRANSVAGINAL TAPE (TVT)  Revision;  Surgeon: Patton Salles, MD;  Location: [facility];  Service: Gynecology;  Laterality: N/A;   WISDOM TOOTH EXTRACTION      SOCIAL HISTORY: Social History   Socioeconomic History   Marital status: Married    Spouse name: Leonette Most   Number of children: 2   Years of education: 16   Highest education level: Bachelor's degree (e.g., BA, AB, BS)  Occupational History   Occupation: Retired    Comment: Engineer, 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  Tobacco Use   Smoking status: Never   Smokeless tobacco: Never  Vaping Use   Vaping status: Never Used  Substance and Sexual Activity   Alcohol use: Not Currently    Alcohol/week: 0.0 standard drinks  of alcohol   Drug use: Never   Sexual activity: Not Currently    Partners: Male    Birth control/protection: Surgical    Comment: TAH--ovaries remain  Other Topics Concern   Not on file  Social History Narrative   Lives with husband   Caffeine- coffee, 2 cups daily   Right handed   Social Determinants of Health   Financial Resource Strain: Not on file  Food Insecurity: Not on file  Transportation Needs: Not on file  Physical Activity: Not on file  Stress: Not on file  Social Connections: Not on file  Intimate Partner Violence: Not on file    FAMILY HISTORY: Family History  Problem Relation Age of Onset   Hyperlipidemia Mother    Memory loss Mother        42s; believed to be age-related memory decline   COPD Father        dec age 67/s-smoked   Macular degeneration Father    Behavior problems Father        at the end of life   Diabetes Brother    Macular degeneration Brother    Suicidality Brother    Diabetes Maternal Grandmother    Heart attack Paternal Grandfather     ALLERGIES:   is allergic to ampicillin and penicillins.  MEDICATIONS:  . Current Outpatient Medications:    acyclovir (ZOVIRAX) 400 MG tablet, Take 1 tablet by mouth twice daily, Disp: 60 tablet, Rfl: 0   Ascorbic Acid (VITAMIN C) 1000 MG tablet, Take 1,000 mg by mouth 2 (two) times daily., Disp: , Rfl:    aspirin EC 81 MG tablet, Take 81 mg by mouth daily., Disp: , Rfl:    b complex vitamins capsule, Take 1 capsule by mouth daily., Disp: , Rfl:    Cholecalciferol (VITAMIN D3) 30 MCG/15ML LIQD, Take by mouth. Pt. Takes 5000 IU daily, Disp: , Rfl:    Cyanocobalamin (B-12) 1000 MCG CAPS, , Disp: , Rfl:    dexamethasone (DECADRON) 4 MG tablet, Take 2 tablets (8 mg) the day of chemo treatment. (every 14 days) (Patient not taking: Reported on 08/08/2023), Disp: 20 tablet, Rfl: 2   EPINEPHrine 0.3 mg/0.3 mL IJ SOAJ injection, INJECT INTRAMUSCULARLLY AS DIRECTED AS NEEDED FOR ANAPHYLAXIS, Disp: , Rfl:    estradiol (ESTRACE) 0.1 MG/GM vaginal cream, INSERT 1 GRAM VAGINALLY TWICE A WEEK AT BEDTIME, Disp: 43 g, Rfl: 0   Iron, Ferrous Sulfate, 325 (65 Fe) MG TABS, Take 325 mg by mouth daily., Disp: , Rfl:    lenalidomide (REVLIMID) 10 MG capsule, TAKE ONE CAPSULE BY MOUTH DAILY FOR 21 DAYS, THEN 7 DAYS OFF, Disp: 21 capsule, Rfl: 0   LORazepam (ATIVAN) 0.5 MG tablet, Take 1 tablet (0.5 mg total) by mouth at bedtime as needed for sleep or anxiety., Disp: 30 tablet, Rfl: 0   Magnesium 400 MG CAPS, Take by mouth., Disp: , Rfl:    Multiple Vitamins-Minerals (PRESERVISION AREDS PO), Take 1 capsule by mouth 2 (two) times daily., Disp: , Rfl:    ondansetron (ZOFRAN) 8 MG tablet, Take 1 tablet (8 mg total) by mouth every 8 (eight) hours as needed for nausea or vomiting. (Patient not taking: Reported on 04/18/2023), Disp: 30 tablet, Rfl: 1   prochlorperazine (COMPAZINE) 10 MG tablet, Take 1 tablet (10 mg total) by mouth every 6 (six) hours as needed for nausea or vomiting. (Patient not taking: Reported on 04/18/2023), Disp: 30  tablet, Rfl: 1   LABORATORY DATA:  I have reviewed the  data as listed     Latest Ref Rng & Units 08/08/2023   12:35 PM 07/11/2023   11:14 AM 06/13/2023    2:24 PM  CBC  WBC 4.0 - 10.5 K/uL 3.5  4.1  4.0   Hemoglobin 12.0 - 15.0 g/dL 16.1  09.6  04.5   Hematocrit 36.0 - 46.0 % 43.2  41.9  42.7   Platelets 150 - 400 K/uL 246  294  264       Latest Ref Rng & Units 08/08/2023   12:36 PM 07/11/2023   11:24 AM 06/13/2023    2:24 PM  CMP  Glucose 70 - 99 mg/dL 409  811  914   BUN 8 - 23 mg/dL 16  17  22    Creatinine 0.44 - 1.00 mg/dL 7.82  9.56  2.13   Sodium 135 - 145 mmol/L 140  138  138   Potassium 3.5 - 5.1 mmol/L 4.2  3.8  4.4   Chloride 98 - 111 mmol/L 103  102  104   CO2 22 - 32 mmol/L 32  27  28   Calcium 8.9 - 10.3 mg/dL 08.6  9.1  9.5   Total Protein 6.5 - 8.1 g/dL 7.1  7.1  7.1   Total Bilirubin <1.2 mg/dL 1.1  1.1  0.8   Alkaline Phos 38 - 126 U/L 74  71  77   AST 15 - 41 U/L 19  22  20    ALT 0 - 44 U/L 26  28  26       01/10/2023 Bone Marrow Biopsy:    10/10/2019 Flow Cytometry (Repeat BM Bx from [facility]):   10/07/18 Repeat BM Bx from [facility]:   Surgical Pathology                                Case: [id number]                               Authorizing Provider:  Rodney Langton, NP    Collected:           07/04/2022 1015             Ordering Location:     [facility] and   Received:            07/04/2022 1048                                    [facility]                                                   Pathologist:           Delilah Shan, MD                                                   Specimens:   A) - Bone Marrow, Aspirate  B) - Bone Marrow, Biopsy                                                                          C) - Bone Marrow, Clot                                                                 DIAGNOSIS   A-C. Peripheral blood and bone  marrow (peripheral smear, aspirate, touch preparation, core biopsy, and clot section): - Low-level plasma cell neoplasm, see comment.   COMMENT: The core biopsy is 40% cellular. Cyclin-D1 immunohistochemical stain demonstrates very rare positive plasma cells (1%). Concurrent flow cytometry study ([id number]) shows a plasma cell population with an atypical immunophenotype, comprising approximately 0.04% of analyzed events.    Reviewed by resident/fellow Zachery Conch  Electronically signed by Delilah Shan, MD on 07/10/2022 at 12:25 PM  Clinical Information   67 year old female with history of plasma cell myeloma.  Gross Examination   A. Peripheral blood and bone marrow labeled with patient's name and medical record number, one peripheral blood smear, three bone marrow aspirate smears and one bone marrow touch preparation are received.   B. "Bone marrow biopsy", in AZF.  Core biopsies of red-brown tissue 1.4 x 0.2 cm in aggregate are submitted in total as block B1 for decalcification.   C. "Bone marrow clot", in AZF.  A 1.8 x 1.8 x 0.3 cm aggregate of red-brown clot is submitted in total as block C1.    J.Elam/ Dr. Alvera Singh   Microscopic Examination        Lab Results  Component Value Date    WBC 5.2 07/04/2022    HGB 12.1 07/04/2022    PLT 364 07/04/2022    MCV 95 07/04/2022    RDWCV 14.6 (H) 07/04/2022    Peripheral blood and automated blood count:  RBCs: The red blood cells are adequate in number,  normocytic and normochromic, with minimal anisopoikilocytosis. WBCs: The white blood cells are adequate in number and show unremarkable morphology. Platelets: The platelets are adequate in number and show unremarkable morphology.   Bone marrow aspirate:  There are adequate cellular particles. M:E ratio is 3:1.  Granulopoiesis: Maturing myeloid elements are present and show complete maturation. Erythropoiesis:  Erythroid elements are present and show complete  maturation. Megakaryocytes: Megakaryocytes are adequate in number and show unremarkable morphology.  There are slightly increased plasma cells with unremarkable morphology.   Bone marrow touch preparation: Adequate marrow elements are present, without additional findings to the aspirate smears.   Prussian blue stained aspirate smear: 0 of 4 stainable iron is present. No ringed sideroblasts are identified.   Bone marrow core biopsy:  The core biopsy is adequate for evaluation. The cellularity is 40%. Maturing myeloid elements are present and show complete maturation. Maturing erythroid elements are present and show complete maturation.  Megakaryocytes are adequate in number and show unremarkable morphology. There are slightly increased plasma cells.   A reticulin stain of the core biopsy shows no  significant reticulin fibrosis.   Aspirate clot section: There are adequate particles present, without additional findings to the core biopsy.   Immunohistochemical stains for Cyclin-D1, CD138, Kappa CISH, and Lambda CISH are performed on the core biopsy section (block B1). CD138, kappa, and lambda in-situ hybridization demonstrates 5-10% predominantly polytypic plasma cells. Cyclin-D1 demonstrates very rare positive plasma cells (1%).  Bone Marrow Differential   500 CELL DIFF   Cell Type Cell Count Reference Range Cell Type Cell Count Reference Range Cell Type Cell Count Reference Range  Segs 23 (7-25) Lymphocytes 15 (3-20) Plasma Cells 8 (0-3.5)  Bands 3 (6-36)     Atypicals 0   RBC Precursors 22 (10-30)  Metas 2 (9-25) Lymphoblasts *   Pronormoblasts 0 (0-3)  Myelos 5 (8-15) Eosinophils 12 (0-4) M:E Ratio 3/1    Promyelos 1 (1-6) Basophils 0 (0-1) Iron 0 (Scale 0-4+)  Myeloblasts 1 (0-3.5) Monocytes 8 (0-2)                                                                                                     Performed by:    Maryclare Bean                                                                                                          July 04, 2022 12:21 PM  Additional Documentation   All immunohistochemistry, in situ hybridization tests and special stains performed at [facility] and reported herein were developed, validated and their performance characteristics determined by the [facility]. During the performance of these tests, appropriate positive and negative control slides are also performed and reviewed. All control slides and internal controls (when applicable) demonstrate the expected immunoreactive patterns and/or nucleic acid hybridization. These ancillary studies were deemed medically necessary by the requesting pathologist. They were ordered following review of the H&E and clinical history except when part of a liver/kidney protocol or where clinical history (e.g. immunocompromised, critically ill, history of malignancy) clearly indicates. Some of the tests may not be cleared or approved by the U.S. Food and Drug Administration (FDA).  The FDA has determined that such clearance or approval is not necessary.  These tests are used for clinical purposes and should not be regarded as investigational or as research.  This laboratory is certified under the Clinical Laboratory Improvement Amendments of 1988 (CLIA) as qualified to perform high complexity clinical testing. At least one block in this case underwent decalcification during its processing. The vast majority of immunohistochemical and hybridization assays were not validated on decalcified tissues. The results were interpreted with caution given the possibility of false negative results on decalcified specimens.  Attestation   All of the diagnostic evaluations on the enumerated specimens have been personally conducted by the pathologists involved in the care of this patient as indicated by the electronic signatures above.  Resulting Agency [facility] SURGICAL PATHOLOGY AND CYTOPATHOLOGY    Specimen Collected: 07/04/22 10:15   Performed by: [facility] SURGICAL PATHOLOGY AND CYTOPATHOLOGY Last Resulted: 07/10/22 12:25  Received From: [facility]  Result Received: 07/14/22 09:58   View Encounter      Received Information Pathology - Bone Marrow (Order [id number])    suggestion  Information displayed in this report may not trend or trigger automated decision support.    Pathology - Bone Marrow Order: [id number] Component 4 wk ago  Case Report Surgical Pathology                                Case: [id number]                               Authorizing Provider:  Rodney Langton, NP    Collected:           07/04/2022 1015             Ordering Location:     [facility] and   Received:            07/04/2022 1048                                    [facility]                                                   Pathologist:           Delilah Shan, MD                                                   Specimens:   A) - Bone Marrow, Aspirate                                                                        B) - Bone Marrow, Biopsy                                                                          C) - Bone Marrow, Clot  DIAGNOSIS   A-C. Peripheral blood and bone marrow (peripheral smear, aspirate, touch preparation, core biopsy, and clot section): - Low-level plasma cell neoplasm, see comment.   COMMENT: The core biopsy is 40% cellular. Cyclin-D1 immunohistochemical stain demonstrates very rare positive plasma cells (1%). Concurrent flow cytometry study ([id number]) shows a plasma cell population with an atypical immunophenotype, comprising approximately 0.04% of analyzed events.    Reviewed by resident/fellow Zachery Conch  Electronically signed by Delilah Shan, MD on 07/10/2022 at 12:25 PM  Clinical Information   67 year old female with history of plasma  cell myeloma.  Gross Examination   A. Peripheral blood and bone marrow labeled with patient's name and medical record number, one peripheral blood smear, three bone marrow aspirate smears and one bone marrow touch preparation are received.   B. "Bone marrow biopsy", in AZF.  Core biopsies of red-brown tissue 1.4 x 0.2 cm in aggregate are submitted in total as block B1 for decalcification.   C. "Bone marrow clot", in AZF.  A 1.8 x 1.8 x 0.3 cm aggregate of red-brown clot is submitted in total as block C1.    J.Elam/ Dr. Alvera Singh     RADIOGRAPHIC STUDIES: I have personally reviewed the radiological images as listed and agreed with the findings in the report. No results found.  ASSESSMENT & PLAN:   67 y.o. caucasian female with  1. H/o Multiple myeloma - currently in remission.  But bone marrow biopsy shows she has turned MRD positive  2. IgA lambda R-ISS stage II multiple myeloma with innumerable lytic lesions in the calvarium, lesion in T7 vertebra and multiple lesions in the pelvis.  No significant bone pain at this time. Diagnosed in January 2017.  s/p treatment as noted above 10/07/18 BM Bx and Flow cytometry indicate that the pt continues to be in CR 10/10/2019 Flow Cytometry revealed "A. Bone Marrow (Plasma Cell Myeloma Minimal Residual Disease Detection by Flow Cytometry): Negative. No phenotypically abnormal plasma cells at or above the limit of detection identified."  PLAN:   -bone marrow biopsy at [facility] on 08/01/2023 showed, "Positive. Minimal residual disease detected 0.01 % phenotypically abnormal plasma cells No monotypic B-cell population identified." -Discussed lab results on 09/05/23 in detail with patient. CBC showed WBC of 3.0K, hemoglobin of 13.2, and platelets of 232K. -myeloma panel from 4 weeks ago showed that her M protein continued to be undetectable -Patient has been tolerating her treatment well without any new or severe toxicities.  -continue maintenance Revlimid  and once a month daratumumab -Continue acyclovir for VZV prophylaxis -Continue aspirin for VTE prophylaxis -educated patient that evaluating several parameters in regards to response to treatment is an Personal assistant  -answered all of patient's questions in detail  FOLLOW-UP:  -continue monthly Dara as per integrated scheduling -Next MD Visit in 3 months.  The total time spent in the appointment was 30 minutes* .  All of the patient's questions were answered with apparent satisfaction. The patient knows to call the clinic with any problems, questions or concerns.   Wyvonnia Lora MD MS AAHIVMS [facility] Hematology/Oncology Physician [facility]  .*Total Encounter Time as defined by the Centers for Medicare and Medicaid Services includes, in addition to the face-to-face time of a patient visit (documented in the note above) non-face-to-face time: obtaining and reviewing outside history, ordering and reviewing medications, tests or procedures, care coordination (communications with other health care professionals or caregivers) and documentation in the medical record.    I,Mitra Faeizi,acting as a  scribe for Wyvonnia Lora, MD.,have documented all relevant documentation on the behalf of Wyvonnia Lora, MD,as directed by  Wyvonnia Lora, MD while in the presence of Wyvonnia Lora, MD.  .I have reviewed the above documentation for accuracy and completeness, and I agree with the above. Johney Maine MD

## 2023-09-05 NOTE — Patient Instructions (Signed)

## 2023-09-05 NOTE — Progress Notes (Signed)
Patient seen by Dr. Kale  Vitals are within treatment parameters.  Labs reviewed: and are within treatment parameters.  Per physician team, patient is ready for treatment and there are NO modifications to the treatment plan.  

## 2023-09-08 ENCOUNTER — Other Ambulatory Visit: Payer: Self-pay

## 2023-09-09 ENCOUNTER — Other Ambulatory Visit: Payer: Self-pay

## 2023-09-11 ENCOUNTER — Encounter: Payer: Self-pay | Admitting: Hematology

## 2023-09-11 NOTE — Progress Notes (Signed)
 67 y.o. G76P2002 Married Caucasian female here for a breast and pelvic exam.    The patient is also followed for vaginal atrophy and use of vaginal estrogen cream. Needs refill.   Also has hx of abnormal paps and LGSIL of vagina.  Due for pap follow up.   Bladder control is good.   Now taking Daratumumab  injections monthly for multiple myeloma.   PCP: Seabron Lenis, MD   Patient's last menstrual period was 09/26/1995 (within months).           Sexually active: No.  The current method of family planning is status post hysterectomy.    Menopausal hormone therapy:  estrace   Exercising: No.   Smoker:  no  OB History     Gravida  2   Para  2   Term  2   Preterm      AB      Living  2      SAB      IAB      Ectopic      Multiple      Live Births              HEALTH MAINTENANCE: Last 2 paps: 03/08/22 ASCUS: HR HPV neg, 02/24/21 LSIL History of abnormal Pap or positive HPV:  yes,  03-14-21 colpo bx showed only atypia. 02-24-21 LSIL  Mammogram:  01/25/23 Breast density Cat B, BI-RADS CAT 1 neg Colonoscopy:  07/2020 normal - due in 2026.  Bone Density:  03/05/23  Result  WNL   Immunization History  Administered Date(s) Administered   DTaP 05/09/2017   DTaP / IPV 07/24/2017   HIB (PRP-T) 05/09/2017, 07/24/2017   Hepatitis B, ADULT 05/09/2017, 07/24/2017   IPV 05/09/2017   Influenza Split 07/12/2022   Influenza,inj,Quad PF,6+ Mos 07/04/2016, 05/23/2019   Influenza,inj,quad, With Preservative 06/20/2015   Pneumococcal Conjugate-13 10/09/2016, 07/24/2017   Pneumococcal Polysaccharide-23 01/17/2017   Respiratory Syncytial Virus Vaccine,Recomb Aduvanted(Arexvy) 08/29/2022      reports that she has never smoked. She has never used smokeless tobacco. She reports that she does not currently use alcohol. She reports that she does not use drugs.  Past Medical History:  Diagnosis Date   Abnormal Pap smear of vagina 03/22/2017   ASCUS pap and negative HR HPV.   Patient is on immunosuppressive medication.    Allergic reaction to bee sting    Anemia    AR (aortic regurgitation) 11/30/2017   trace noted on ECHO   Atrial septal aneurysm per 11-30-17 echo   trivial pericardial effusion   Complicated migraine    Constipation    DDD (degenerative disc disease), cervical    DDD (degenerative disc disease), lumbosacral    Fatty liver    Female proctocele without uterine prolapse    Fibroid    reason for Hysterectomy   FTT (failure to thrive) in adult 08/09/2017   Gastroesophageal reflux disease without esophagitis 01/17/2017   Generalized anxiety disorder    Grade I diastolic dysfunction    Hammer toe 05/13/2018   Hematuria 11/08/2015   History of COVID-19 12/2020   History of stress incontinence    Hypergammaglobulinemia    Hyperlipidemia    Hypoxemia 12/16/2007   Insomnia    Leg length discrepancy    Loss of appetite    Lung nodule    Malignant neoplasm of thymus (HCC) 12/03/2007   Metastatic multiple myeloma to bone (HCC) 10/19/2015   Metatarsalgia of right foot 07/15/2018   MR (mitral regurgitation) 11/30/2017  trace noted on ECHO   Muscular fasciculation    Nasal septal deviation    Nasal turbinate hypertrophy    Near syncope    Obstructive sleep apnea 12/03/2007   Other fatigue    Peripheral edema    Plasma cell dyscrasia    PONV (postoperative nausea and vomiting)    thinks morphine  caused nausea   Postoperative urinary retention 05/14/2018   Prediabetes    Pure hypercholesterolemia    Rash 11/01/2015   Restless leg syndrome    S/P autologous bone marrow transplantation (HCC) 03/29/2016   Shortness of breath 12/04/2007   TR (tricuspid regurgitation) 11/30/2017   trace noted on ECHO   Urinary incontinence    UTI (urinary tract infection)    Vaginal granulation tissue 12/03/2017   Vitamin D  deficiency    Wears glasses     Past Surgical History:  Procedure Laterality Date   ABDOMINAL HYSTERECTOMY  1997    TAH--ovaries remain--Dr. Debby Lares   ANTERIOR AND POSTERIOR REPAIR N/A 05/14/2018   Procedure: ANTERIOR (CYSTOCELE) AND POSTERIOR REPAIR (RECTOCELE);  Surgeon: Cathlyn JAYSON Nikki Bobie FORBES, MD;  Location: [facility];  Service: Gynecology;  Laterality: N/A;   BLADDER SUSPENSION N/A 05/14/2018   Procedure: TRANSVAGINAL TAPE (TVT) PROCEDURE exact midurethral sling;  Surgeon: Cathlyn JAYSON Nikki Bobie FORBES, MD;  Location: [facility];  Service: Gynecology;  Laterality: N/A;   BONE MARROW TRANSPLANT  2017   done at [facility]   BREAST BIOPSY     COLONOSCOPY     CYSTOSCOPY N/A 05/14/2018   Procedure: CYSTOSCOPY;  Surgeon: Cathlyn JAYSON Nikki Bobie FORBES, MD;  Location: [facility];  Service: Gynecology;  Laterality: N/A;   CYSTOSCOPY N/A 05/30/2018   Procedure: CYSTOSCOPY;  Surgeon: Cathlyn JAYSON Nikki Bobie FORBES, MD;  Location: [facility];  Service: Gynecology;  Laterality: N/A;   ROBOTIC ASSISTED LAPAROSCOPIC SACROCOLPOPEXY N/A 05/14/2018   Procedure: XI ROBOTIC ASSISTED LAPAROSCOPIC SACROCOLPOPEXY;  Surgeon: Cathlyn JAYSON Nikki Bobie FORBES, MD;  Location: [facility];  Service: Gynecology;  Laterality: N/A;  6 hours OR time. Need extended stay recovery bed.   SALPINGOOPHORECTOMY Bilateral 05/14/2018   Procedure: SALPINGO OOPHORECTOMY;  Surgeon: Cathlyn JAYSON Nikki Bobie FORBES, MD;  Location: [facility];  Service: Gynecology;  Laterality: Bilateral;   THYMECTOMY  1999   TRANSVAGINAL TAPE (TVT) REMOVAL N/A 05/30/2018   Procedure: TRANSVAGINAL TAPE (TVT)  Revision;  Surgeon: Cathlyn JAYSON Nikki Bobie FORBES, MD;  Location: [facility];  Service: Gynecology;  Laterality: N/A;   WISDOM TOOTH EXTRACTION      Current Outpatient Medications  Medication Sig Dispense Refill   acyclovir  (ZOVIRAX ) 400 MG tablet Take 1 tablet by mouth twice daily 60 tablet 0   Ascorbic Acid (VITAMIN C) 1000 MG tablet Take 1,000 mg by mouth 2 (two) times daily.     aspirin  EC 81 MG tablet Take 81 mg by mouth daily.     b complex vitamins capsule Take 1 capsule  by mouth daily.     Cholecalciferol  (VITAMIN D3) 30 MCG/15ML LIQD Take by mouth. Pt. Takes 5000 IU daily     Cyanocobalamin (B-12) 1000 MCG CAPS      dexamethasone  (DECADRON ) 4 MG tablet Take 2 tablets (8 mg) the day of chemo treatment. (every 14 days) 20 tablet 2   EPINEPHrine  0.3 mg/0.3 mL IJ SOAJ injection INJECT INTRAMUSCULARLLY AS DIRECTED AS NEEDED FOR ANAPHYLAXIS     estradiol  (ESTRACE ) 0.1 MG/GM vaginal cream INSERT 1 GRAM VAGINALLY TWICE A WEEK AT BEDTIME 43  g 0   Iron, Ferrous Sulfate, 325 (65 Fe) MG TABS Take 325 mg by mouth daily.     lenalidomide  (REVLIMID ) 10 MG capsule TAKE ONE CAPSULE BY MOUTH DAILY FOR 21 DAYS, THEN 7 DAYS OFF 21 capsule 0   LORazepam  (ATIVAN ) 0.5 MG tablet Take 1 tablet (0.5 mg total) by mouth at bedtime as needed for sleep or anxiety. 30 tablet 0   Magnesium  400 MG CAPS Take by mouth.     Multiple Vitamins-Minerals (PRESERVISION AREDS PO) Take 1 capsule by mouth 2 (two) times daily.     ondansetron  (ZOFRAN ) 8 MG tablet Take 1 tablet (8 mg total) by mouth every 8 (eight) hours as needed for nausea or vomiting. 30 tablet 1   prochlorperazine  (COMPAZINE ) 10 MG tablet Take 1 tablet (10 mg total) by mouth every 6 (six) hours as needed for nausea or vomiting. 30 tablet 1   No current facility-administered medications for this visit.    ALLERGIES: Ampicillin and Penicillins  Family History  Problem Relation Age of Onset   Hyperlipidemia Mother    Memory loss Mother        45s; believed to be age-related memory decline   COPD Father        dec age 19/s-smoked   Macular degeneration Father    Behavior problems Father        at the end of life   Diabetes Brother    Macular degeneration Brother    Suicidality Brother    Diabetes Maternal Grandmother    Heart attack Paternal Grandfather     Review of Systems  All other systems reviewed and are negative.   PHYSICAL EXAM:  Ht 5' 8 (1.727 m)   Wt 129 lb (58.5 kg)   LMP 09/26/1995 (Within Months)   BMI  19.61 kg/m     General appearance: alert, cooperative and appears stated age Head: normocephalic, without obvious abnormality, atraumatic Neck: no adenopathy, supple, symmetrical, trachea midline and thyroid normal to inspection and palpation Lungs: clear to auscultation bilaterally Breasts: normal appearance, no masses or tenderness, No nipple retraction or dimpling, No nipple discharge or bleeding, No axillary adenopathy Heart: regular rate and rhythm Abdomen: soft, non-tender; no masses, no organomegaly Extremities: extremities normal, atraumatic, no cyanosis or edema Skin: skin color, texture, turgor normal. No rashes or lesions Lymph nodes: cervical, supraclavicular, and axillary nodes normal. Neurologic: grossly normal  Pelvic: External genitalia:  no lesions              No abnormal inguinal nodes palpated.              Urethra:  normal appearing urethra with no masses, tenderness or lesions              Bartholins and Skenes: normal                 Vagina: normal appearing vagina with normal color and discharge, no lesions.  Estrogen cream present and wiped away with Q-tip prior to collecting pap.  Good support.               Cervix: absent              Pap taken: yes Bimanual Exam:  Uterus:  absent.               Adnexa: no mass, fullness, tenderness              Rectal exam: yes.  Confirms.  Anus:  normal sphincter tone, no lesions  Chaperone was present for exam:  Damien FALCON, CMA  ASSESSMENT: Encounter for breast and pelvic exam.  High risk Medicare GYN exam.  Status post TAH.  Status post robotically assisted bilateral salpingo-oophorectomy, sacral colpopexy, anterior and posterior colporrhaphy, TVT Exact midurethral sling and cystoscopy. Status post revision of midurethral sling. History of abnormal paps and VAIN I. Vaginal atrophy. Encounter for medication monitoring.  Hx multiple myeloma. Hx murmur.  Hx aortic regurgitation.   PLAN: Mammogram screening  discussed. Self breast awareness reviewed. Pap and HRV collected:  Yes.   Guidelines for Calcium , Vitamin D , regular exercise program including cardiovascular and weight bearing exercise. Medication refills:  vaginal estradiol  cream.  I discussed potential effect on breast cancer.  Labs with oncology and PCP.  Follow up:  1 year and prn.    20  total time was spent for this patient encounter, including preparation, face-to-face counseling with the patient, coordination of care, and documentation of the encounter in addition to doing the breast and pelvic exam and pap.

## 2023-09-14 LAB — MULTIPLE MYELOMA PANEL, SERUM
Albumin SerPl Elph-Mcnc: 3.8 g/dL (ref 2.9–4.4)
Albumin/Glob SerPl: 1.9 — ABNORMAL HIGH (ref 0.7–1.7)
Alpha 1: 0.2 g/dL (ref 0.0–0.4)
Alpha2 Glob SerPl Elph-Mcnc: 0.6 g/dL (ref 0.4–1.0)
B-Globulin SerPl Elph-Mcnc: 0.9 g/dL (ref 0.7–1.3)
Gamma Glob SerPl Elph-Mcnc: 0.4 g/dL (ref 0.4–1.8)
Globulin, Total: 2.1 g/dL — ABNORMAL LOW (ref 2.2–3.9)
IgA: 111 mg/dL (ref 87–352)
IgG (Immunoglobin G), Serum: 603 mg/dL (ref 586–1602)
IgM (Immunoglobulin M), Srm: 26 mg/dL (ref 26–217)
Total Protein ELP: 5.9 g/dL — ABNORMAL LOW (ref 6.0–8.5)

## 2023-09-25 ENCOUNTER — Encounter: Payer: Self-pay | Admitting: Obstetrics and Gynecology

## 2023-09-25 ENCOUNTER — Ambulatory Visit (INDEPENDENT_AMBULATORY_CARE_PROVIDER_SITE_OTHER): Payer: Medicare Other | Admitting: Obstetrics and Gynecology

## 2023-09-25 ENCOUNTER — Other Ambulatory Visit (HOSPITAL_COMMUNITY)
Admission: RE | Admit: 2023-09-25 | Discharge: 2023-09-25 | Disposition: A | Discharge status: Home / Self Care | Payer: Medicare Other | Source: Ambulatory Visit | Attending: Obstetrics and Gynecology | Admitting: Obstetrics and Gynecology

## 2023-09-25 ENCOUNTER — Other Ambulatory Visit: Payer: Self-pay

## 2023-09-25 ENCOUNTER — Encounter: Payer: Self-pay | Admitting: Hematology

## 2023-09-25 VITALS — BP 112/60 | HR 87 | Ht 68.0 in | Wt 129.0 lb

## 2023-09-25 DIAGNOSIS — R87629 Unspecified abnormal cytological findings in specimens from vagina: Secondary | ICD-10-CM | POA: Diagnosis not present

## 2023-09-25 DIAGNOSIS — C9001 Multiple myeloma in remission: Secondary | ICD-10-CM

## 2023-09-25 DIAGNOSIS — Z1272 Encounter for screening for malignant neoplasm of vagina: Secondary | ICD-10-CM | POA: Insufficient documentation

## 2023-09-25 DIAGNOSIS — Z1151 Encounter for screening for human papillomavirus (HPV): Secondary | ICD-10-CM | POA: Insufficient documentation

## 2023-09-25 DIAGNOSIS — N952 Postmenopausal atrophic vaginitis: Secondary | ICD-10-CM | POA: Diagnosis not present

## 2023-09-25 DIAGNOSIS — Z01419 Encounter for gynecological examination (general) (routine) without abnormal findings: Secondary | ICD-10-CM

## 2023-09-25 DIAGNOSIS — Z01411 Encounter for gynecological examination (general) (routine) with abnormal findings: Secondary | ICD-10-CM | POA: Diagnosis present

## 2023-09-25 DIAGNOSIS — Z9189 Other specified personal risk factors, not elsewhere classified: Secondary | ICD-10-CM

## 2023-09-25 DIAGNOSIS — Z9289 Personal history of other medical treatment: Secondary | ICD-10-CM | POA: Diagnosis not present

## 2023-09-25 MED ORDER — ESTRADIOL 0.1 MG/GM VA CREA: TOPICAL_CREAM | VAGINAL | 1 refills | Status: DC

## 2023-09-25 MED ORDER — LENALIDOMIDE 10 MG PO CAPS: ORAL_CAPSULE | ORAL | 0 refills | Status: DC

## 2023-09-25 NOTE — Patient Instructions (Signed)

## 2023-09-26 ENCOUNTER — Other Ambulatory Visit: Payer: Self-pay

## 2023-09-28 LAB — CYTOLOGY - PAP
Comment: NEGATIVE
High risk HPV: NEGATIVE

## 2023-10-03 ENCOUNTER — Inpatient Hospital Stay: Payer: Medicare Other

## 2023-10-03 ENCOUNTER — Other Ambulatory Visit: Payer: Self-pay | Admitting: *Deleted

## 2023-10-03 ENCOUNTER — Ambulatory Visit: Payer: Medicare Other | Admitting: Hematology

## 2023-10-03 ENCOUNTER — Inpatient Hospital Stay: Payer: Medicare Other | Attending: Hematology

## 2023-10-03 ENCOUNTER — Other Ambulatory Visit: Payer: Medicare Other

## 2023-10-03 ENCOUNTER — Other Ambulatory Visit: Payer: Self-pay

## 2023-10-03 VITALS — BP 112/70 | HR 73 | Temp 98.6°F | Resp 16 | Wt 131.0 lb

## 2023-10-03 DIAGNOSIS — C9001 Multiple myeloma in remission: Secondary | ICD-10-CM

## 2023-10-03 DIAGNOSIS — R87622 Low grade squamous intraepithelial lesion on cytologic smear of vagina (LGSIL): Secondary | ICD-10-CM

## 2023-10-03 DIAGNOSIS — C9000 Multiple myeloma not having achieved remission: Secondary | ICD-10-CM | POA: Insufficient documentation

## 2023-10-03 DIAGNOSIS — Z5112 Encounter for antineoplastic immunotherapy: Secondary | ICD-10-CM | POA: Diagnosis present

## 2023-10-03 LAB — CBC WITH DIFFERENTIAL (CANCER CENTER ONLY)
Abs Immature Granulocytes: 0 10*3/uL (ref 0.00–0.07)
Basophils Absolute: 0 10*3/uL (ref 0.0–0.1)
Basophils Relative: 1 %
Eosinophils Absolute: 0.1 10*3/uL (ref 0.0–0.5)
Eosinophils Relative: 4 %
HCT: 40 % (ref 36.0–46.0)
Hemoglobin: 13.4 g/dL (ref 12.0–15.0)
Immature Granulocytes: 0 %
Lymphocytes Relative: 21 %
Lymphs Abs: 0.6 10*3/uL — ABNORMAL LOW (ref 0.7–4.0)
MCH: 31.1 pg (ref 26.0–34.0)
MCHC: 33.5 g/dL (ref 30.0–36.0)
MCV: 92.8 fL (ref 80.0–100.0)
Monocytes Absolute: 0.4 10*3/uL (ref 0.1–1.0)
Monocytes Relative: 13 %
Neutro Abs: 1.8 10*3/uL (ref 1.7–7.7)
Neutrophils Relative %: 61 %
Platelet Count: 237 10*3/uL (ref 150–400)
RBC: 4.31 MIL/uL (ref 3.87–5.11)
RDW: 14.1 % (ref 11.5–15.5)
WBC Count: 2.9 10*3/uL — ABNORMAL LOW (ref 4.0–10.5)
nRBC: 0 % (ref 0.0–0.2)

## 2023-10-03 LAB — CMP (CANCER CENTER ONLY)
ALT: 66 U/L — ABNORMAL HIGH (ref 0–44)
AST: 32 U/L (ref 15–41)
Albumin: 3.9 g/dL (ref 3.5–5.0)
Alkaline Phosphatase: 77 U/L (ref 38–126)
Anion gap: 6 (ref 5–15)
BUN: 17 mg/dL (ref 8–23)
CO2: 27 mmol/L (ref 22–32)
Calcium: 9.2 mg/dL (ref 8.9–10.3)
Chloride: 106 mmol/L (ref 98–111)
Creatinine: 0.77 mg/dL (ref 0.44–1.00)
GFR, Estimated: >60 mL/min (ref >60)
Glucose, Bld: 107 mg/dL — ABNORMAL HIGH (ref 70–99)
Potassium: 3.9 mmol/L (ref 3.5–5.1)
Sodium: 139 mmol/L (ref 135–145)
Total Bilirubin: 1.1 mg/dL (ref 0.0–1.2)
Total Protein: 6.2 g/dL — ABNORMAL LOW (ref 6.5–8.1)

## 2023-10-03 MED ORDER — DARATUMUMAB-HYALURONIDASE-FIHJ 1800-30000 MG-UT/15ML ~~LOC~~ SOLN
1800.0000 mg | Freq: Once | SUBCUTANEOUS | Status: AC
  Administered 2023-10-03: 1800 mg via SUBCUTANEOUS
  Filled 2023-10-03: qty 15

## 2023-10-03 MED ORDER — ACETAMINOPHEN 325 MG PO TABS
650.0000 mg | ORAL_TABLET | Freq: Once | ORAL | Status: AC
  Administered 2023-10-03: 650 mg via ORAL
  Filled 2023-10-03: qty 2

## 2023-10-03 MED ORDER — DIPHENHYDRAMINE HCL 25 MG PO CAPS
25.0000 mg | ORAL_CAPSULE | Freq: Once | ORAL | Status: AC
  Administered 2023-10-03: 25 mg via ORAL
  Filled 2023-10-03: qty 1

## 2023-10-03 MED ORDER — FAMOTIDINE 20 MG PO TABS
20.0000 mg | ORAL_TABLET | Freq: Once | ORAL | Status: AC
  Administered 2023-10-03: 20 mg via ORAL
  Filled 2023-10-03: qty 1

## 2023-10-03 NOTE — Patient Instructions (Signed)

## 2023-10-04 ENCOUNTER — Other Ambulatory Visit: Payer: Self-pay | Admitting: Hematology

## 2023-10-04 DIAGNOSIS — C9001 Multiple myeloma in remission: Secondary | ICD-10-CM

## 2023-10-05 ENCOUNTER — Encounter: Payer: Self-pay | Admitting: Hematology

## 2023-10-08 NOTE — Progress Notes (Signed)
GYNECOLOGY  VISIT   HPI: 67 y.o.   Married  Caucasian female   G2P2002 with Patient's last menstrual period was 09/26/1995 (within months).   here for: colposcopy for pap LGSIL, neg HR HPV.   Last colposcopy was 6/20222 - atypia consistent with HPV effect.      Has hx of VAIN I.   Takes immunosuppressive medication.   GYNECOLOGIC HISTORY: Patient's last menstrual period was 09/26/1995 (within months). Contraception:  hyst Menopausal hormone therapy:  estrace Last 2 paps: 09/25/23 LSIL: HR HPV neg,  03/08/22 ASCUS: HR HPV ne  History of abnormal Pap or positive HPV:  yes, 03-14-21 colpo bx showed only atypia. 02-24-21 LSIL  Mammogram:  01/25/23 Breast density Cat B, BI-RADS CAT 1 neg         OB History     Gravida  2   Para  2   Term  2   Preterm      AB      Living  2      SAB      IAB      Ectopic      Multiple      Live Births                 Patient Active Problem List   Diagnosis Date Noted   DDD (degenerative disc disease), cervical 12/22/2021   DDD (degenerative disc disease), lumbosacral 12/22/2021   Atrial septal aneurysm 12/22/2021   Female proctocele without uterine prolapse    Allergic reaction to bee sting    Generalized anxiety disorder    Complicated migraine    Constipation    Hyperlipidemia    Insomnia    Loss of appetite    Prediabetes    Pure hypercholesterolemia    Vitamin D deficiency    Leg length discrepancy    Metatarsalgia of right foot 07/15/2018   Hammer toe 05/13/2018   Vaginal granulation tissue 12/03/2017   Muscular fasciculation    Other fatigue    Abdominal pain    FTT (failure to thrive) in adult 08/09/2017   Counseling regarding advanced care planning and goals of care 08/03/2017   Gastroesophageal reflux disease without esophagitis 01/17/2017   S/P autologous bone marrow transplantation 03/29/2016   Peripheral edema 12/19/2015   Hematuria 11/08/2015   Multiple myeloma 10/19/2015   Metastatic multiple  myeloma to bone 10/19/2015   Plasma cell dyscrasia 10/05/2015   Anemia 09/29/2015   Hypergammaglobulinemia 09/29/2015   Hypoxemia 12/16/2007   Shortness of breath 12/04/2007   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   Obstructive sleep apnea 12/03/2007   Restless leg syndrome 12/03/2007    Past Medical History:  Diagnosis Date   Abnormal Pap smear of vagina 03/22/2017   ASCUS pap and negative HR HPV.  Patient is on immunosuppressive medication.    Allergic reaction to bee sting    Anemia    AR (aortic regurgitation) 11/30/2017   trace noted on ECHO   Atrial septal aneurysm per 11-30-17 echo   trivial pericardial effusion   Complicated migraine    Constipation    DDD (degenerative disc disease), cervical    DDD (degenerative disc disease), lumbosacral    Fatty liver    Female proctocele without uterine prolapse    Fibroid    reason for Hysterectomy   FTT (failure to thrive) in adult 08/09/2017   Gastroesophageal reflux disease without esophagitis 01/17/2017   Generalized anxiety disorder    Grade I diastolic dysfunction  Hammer toe 05/13/2018   Hematuria 11/08/2015   History of COVID-19 12/2020   History of stress incontinence    Hypergammaglobulinemia    Hyperlipidemia    Hypoxemia 12/16/2007   Insomnia    Leg length discrepancy    Loss of appetite    Lung nodule    Malignant neoplasm of thymus (HCC) 12/03/2007   Metastatic multiple myeloma to bone (HCC) 10/19/2015   Metatarsalgia of right foot 07/15/2018   MR (mitral regurgitation) 11/30/2017   trace noted on ECHO   Muscular fasciculation    Nasal septal deviation    Nasal turbinate hypertrophy    Near syncope    Obstructive sleep apnea 12/03/2007   Other fatigue    Peripheral edema    Plasma cell dyscrasia    PONV (postoperative nausea and vomiting)    thinks morphine caused nausea   Postoperative urinary retention 05/14/2018   Prediabetes    Pure hypercholesterolemia    Rash 11/01/2015   Restless leg  syndrome    S/P autologous bone marrow transplantation (HCC) 03/29/2016   Shortness of breath 12/04/2007   TR (tricuspid regurgitation) 11/30/2017   trace noted on ECHO   Urinary incontinence    UTI (urinary tract infection)    Vaginal granulation tissue 12/03/2017   Vitamin D deficiency    Wears glasses     Past Surgical History:  Procedure Laterality Date   ABDOMINAL HYSTERECTOMY  1997   TAH--ovaries remain--Dr. Tracey Harries   ANTERIOR AND POSTERIOR REPAIR N/A 05/14/2018   Procedure: ANTERIOR (CYSTOCELE) AND POSTERIOR REPAIR (RECTOCELE);  Surgeon: Patton Salles, MD;  Location: [facility];  Service: Gynecology;  Laterality: N/A;   BLADDER SUSPENSION N/A 05/14/2018   Procedure: TRANSVAGINAL TAPE (TVT) PROCEDURE exact midurethral sling;  Surgeon: Patton Salles, MD;  Location: [facility];  Service: Gynecology;  Laterality: N/A;   BONE MARROW TRANSPLANT  2017   done at [facility]   BREAST BIOPSY     COLONOSCOPY     CYSTOSCOPY N/A 05/14/2018   Procedure: CYSTOSCOPY;  Surgeon: Patton Salles, MD;  Location: [facility];  Service: Gynecology;  Laterality: N/A;   CYSTOSCOPY N/A 05/30/2018   Procedure: CYSTOSCOPY;  Surgeon: Patton Salles, MD;  Location: [facility];  Service: Gynecology;  Laterality: N/A;   ROBOTIC ASSISTED LAPAROSCOPIC SACROCOLPOPEXY N/A 05/14/2018   Procedure: XI ROBOTIC ASSISTED LAPAROSCOPIC SACROCOLPOPEXY;  Surgeon: Patton Salles, MD;  Location: [facility];  Service: Gynecology;  Laterality: N/A;  6 hours OR time. Need extended stay recovery bed.   SALPINGOOPHORECTOMY Bilateral 05/14/2018   Procedure: SALPINGO OOPHORECTOMY;  Surgeon: Patton Salles, MD;  Location: [facility];  Service: Gynecology;  Laterality: Bilateral;   THYMECTOMY  1999   TRANSVAGINAL TAPE (TVT) REMOVAL N/A 05/30/2018   Procedure: TRANSVAGINAL TAPE (TVT)  Revision;  Surgeon: Patton Salles, MD;  Location: [facility];   Service: Gynecology;  Laterality: N/A;   WISDOM TOOTH EXTRACTION      Current Outpatient Medications  Medication Sig Dispense Refill   acyclovir (ZOVIRAX) 400 MG tablet Take 1 tablet by mouth twice daily 60 tablet 0   Ascorbic Acid (VITAMIN C) 1000 MG tablet Take 1,000 mg by mouth 2 (two) times daily.     aspirin EC 81 MG tablet Take 81 mg by mouth daily.     b complex vitamins capsule Take 1 capsule by mouth daily.     Cholecalciferol (  VITAMIN D3) 30 MCG/15ML LIQD Take by mouth. Pt. Takes 5000 IU daily     Cyanocobalamin (B-12) 1000 MCG CAPS      dexamethasone (DECADRON) 4 MG tablet Take 2 tablets (8 mg) the day of chemo treatment. (every 14 days) 20 tablet 2   EPINEPHrine 0.3 mg/0.3 mL IJ SOAJ injection INJECT INTRAMUSCULARLLY AS DIRECTED AS NEEDED FOR ANAPHYLAXIS     estradiol (ESTRACE) 0.1 MG/GM vaginal cream INSERT  1 GRAM VAGINALLY TWICE A WEEK AT BEDTIME 43 g 1   Iron, Ferrous Sulfate, 325 (65 Fe) MG TABS Take 325 mg by mouth daily.     lenalidomide (REVLIMID) 10 MG capsule TAKE ONE CAPSULE BY MOUTH DAILY FOR 21 DAYS, THEN 7 DAYS OFF 21 capsule 0   LORazepam (ATIVAN) 0.5 MG tablet Take 1 tablet (0.5 mg total) by mouth at bedtime as needed for sleep or anxiety. 30 tablet 0   Magnesium 400 MG CAPS Take by mouth.     Multiple Vitamins-Minerals (PRESERVISION AREDS PO) Take 1 capsule by mouth 2 (two) times daily.     ondansetron (ZOFRAN) 8 MG tablet Take 1 tablet (8 mg total) by mouth every 8 (eight) hours as needed for nausea or vomiting. 30 tablet 1   prochlorperazine (COMPAZINE) 10 MG tablet Take 1 tablet (10 mg total) by mouth every 6 (six) hours as needed for nausea or vomiting. 30 tablet 1   No current facility-administered medications for this visit.     ALLERGIES: Ampicillin and Penicillins  Family History  Problem Relation Age of Onset   Hyperlipidemia Mother    Memory loss Mother        16s; believed to be age-related memory decline   COPD Father        dec age  3/s-smoked   Macular degeneration Father    Behavior problems Father        at the end of life   Diabetes Brother    Macular degeneration Brother    Suicidality Brother    Diabetes Maternal Grandmother    Heart attack Paternal Grandfather     Social History   Socioeconomic History   Marital status: Married    Spouse name: Leonette Most   Number of children: 2   Years of education: 16   Highest education level: Bachelor's degree (e.g., BA, AB, BS)  Occupational History   Occupation: Retired    Comment: Engineer, 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  Tobacco Use   Smoking status: Never   Smokeless tobacco: Never  Vaping Use   Vaping status: Never Used  Substance and Sexual Activity   Alcohol use: Not Currently    Alcohol/week: 0.0 standard drinks of alcohol   Drug use: Never   Sexual activity: Not Currently    Partners: Male    Birth control/protection: Surgical    Comment: TAH--ovaries remain  Other Topics Concern   Not on file  Social History Narrative   Lives with husband   Caffeine- coffee, 2 cups daily   Right handed   Social Drivers of Corporate investment banker Strain: Not on file  Food Insecurity: Not on file  Transportation Needs: Not on file  Physical Activity: Not on file  Stress: Not on file  Social Connections: Not on file  Intimate Partner Violence: Not on file    Review of Systems  All other systems reviewed and are negative.   PHYSICAL EXAMINATION:   BP 122/80 (BP Location: Right Arm, Patient Position: Sitting, Cuff Size: Small)   Pulse 75  Ht 5\' 8"  (1.727 m)   Wt 131 lb (59.4 kg)   LMP 09/26/1995 (Within Months)   SpO2 98%   BMI 19.92 kg/m     General appearance: alert, cooperative and appears stated age   Pelvic: External genitalia:  no lesions              Urethra:  normal appearing urethra with no masses, tenderness or lesions              Bartholins and Skenes: normal                 Vagina: normal appearing vagina with normal color and discharge, no  lesions              Cervix:  absent                Colposcopy - vagina Consent for procedure. Time out completed. 3% acetic acid used in vagina. White light and green light filter used.  Findings:    Cervix:  absent Vagina:  no aceto white change. Lugol's solution applied.  Decreased uptake right vaginal apex, salt and pepper appearance.  Biopsies:  right vaginal apex.  Monsel's placed.  Minimal EBL. No complications.   Chaperone was present for exam:  Warren Lacy, CMA  ASSESSMENT:  Pap LGSIL, neg HR HPV.  Hx VAIN I.  On immunosuppressive medication.   PLAN:  Pap and HPV negative status discussed.  Fu biopsy result.  I anticipate next vaginal cancer screening in one year.

## 2023-10-10 LAB — MULTIPLE MYELOMA PANEL, SERUM
Albumin SerPl Elph-Mcnc: 3.6 g/dL (ref 2.9–4.4)
Albumin/Glob SerPl: 1.9 — ABNORMAL HIGH (ref 0.7–1.7)
Alpha 1: 0.2 g/dL (ref 0.0–0.4)
Alpha2 Glob SerPl Elph-Mcnc: 0.6 g/dL (ref 0.4–1.0)
B-Globulin SerPl Elph-Mcnc: 0.8 g/dL (ref 0.7–1.3)
Gamma Glob SerPl Elph-Mcnc: 0.4 g/dL (ref 0.4–1.8)
Globulin, Total: 2 g/dL — ABNORMAL LOW (ref 2.2–3.9)
IgA: 118 mg/dL (ref 87–352)
IgG (Immunoglobin G), Serum: 604 mg/dL (ref 586–1602)
IgM (Immunoglobulin M), Srm: 22 mg/dL — ABNORMAL LOW (ref 26–217)
Total Protein ELP: 5.6 g/dL — ABNORMAL LOW (ref 6.0–8.5)

## 2023-10-12 ENCOUNTER — Encounter: Payer: Self-pay | Admitting: Hematology

## 2023-10-15 ENCOUNTER — Encounter: Payer: Self-pay | Admitting: Hematology

## 2023-10-22 ENCOUNTER — Encounter: Payer: Self-pay | Admitting: Obstetrics and Gynecology

## 2023-10-22 ENCOUNTER — Other Ambulatory Visit (HOSPITAL_COMMUNITY)
Admission: RE | Admit: 2023-10-22 | Discharge: 2023-10-22 | Disposition: A | Discharge status: Home / Self Care | Payer: Medicare Other | Source: Ambulatory Visit | Attending: Obstetrics and Gynecology | Admitting: Obstetrics and Gynecology

## 2023-10-22 ENCOUNTER — Ambulatory Visit (INDEPENDENT_AMBULATORY_CARE_PROVIDER_SITE_OTHER): Payer: Medicare Other | Admitting: Obstetrics and Gynecology

## 2023-10-22 VITALS — BP 122/80 | HR 75 | Ht 68.0 in | Wt 131.0 lb

## 2023-10-22 DIAGNOSIS — R87622 Low grade squamous intraepithelial lesion on cytologic smear of vagina (LGSIL): Secondary | ICD-10-CM | POA: Diagnosis present

## 2023-10-22 NOTE — Patient Instructions (Signed)
Colposcopy, Care After  The following information offers guidance on how to care for yourself after your procedure. Your health care provider may also give you more specific instructions. If you have problems or questions, contact your health care provider. What can I expect after the procedure? If you had a colposcopy without a biopsy, you can expect to feel fine right away after your procedure. However, you may have some spotting of blood for a few days. You can return to your normal activities. If you had a colposcopy with a biopsy, it is common after the procedure to have: Soreness and mild pain. These may last for a few days. Mild vaginal bleeding or discharge that is dark-colored and grainy. This may last for a few days. The discharge may be caused by a liquid (solution) that was used during the procedure. You may need to wear a sanitary pad during this time. Spotting of blood for at least 48 hours after the procedure. Follow these instructions at home: Medicines Take over-the-counter and prescription medicines only as told by your health care provider. Talk with your health care provider about what type of over-the-counter pain medicines and prescription medicines you can start to take again. It is especially important to talk with your health care provider if you take blood thinners. Activity Avoid using douche products, using tampons, and having sex for at least 3 days after the procedure or for as long as told by your health care provider. Return to your normal activities as told by your health care provider. Ask your health care provider what activities are safe for you. General instructions Ask your health care provider if you may take baths, swim, or use a hot tub. You may take showers. If you use birth control (contraception), continue to use it. Keep all follow-up visits. This is important. Contact a health care provider if: You have a fever or chills. You faint or feel  light-headed. Get help right away if: You have heavy bleeding from your vagina or pass blood clots. Heavy bleeding is bleeding that soaks through a sanitary pad in less than 1 hour. You have vaginal discharge that is abnormal, is yellow in color, or smells bad. This could be a sign of infection. You have severe pain or cramps in your lower abdomen that do not go away with medicine. Summary If you had a colposcopy without a biopsy, you can expect to feel fine right away, but you may have some spotting of blood for a few days. You can return to your normal activities. If you had a colposcopy with a biopsy, it is common to have mild pain for a few days and spotting for 48 hours after the procedure. Avoid using douche products, using tampons, and having sex for at least 3 days after the procedure or for as long as told by your health care provider. Get help right away if you have heavy bleeding, severe pain, or signs of infection. This information is not intended to replace advice given to you by your health care provider. Make sure you discuss any questions you have with your health care provider. Document Revised: 02/06/2021 Document Reviewed: 02/06/2021 Elsevier Patient Education  2024 [employer].

## 2023-10-24 ENCOUNTER — Encounter: Payer: Self-pay | Admitting: Obstetrics and Gynecology

## 2023-10-24 ENCOUNTER — Telehealth: Payer: Self-pay | Admitting: Hematology

## 2023-10-24 LAB — SURGICAL PATHOLOGY

## 2023-10-24 NOTE — Telephone Encounter (Signed)
Spoke with patient confirming upcoming appointment change

## 2023-10-25 ENCOUNTER — Other Ambulatory Visit: Payer: Self-pay

## 2023-10-25 DIAGNOSIS — C9001 Multiple myeloma in remission: Secondary | ICD-10-CM

## 2023-10-25 MED ORDER — LENALIDOMIDE 10 MG PO CAPS: ORAL_CAPSULE | ORAL | 0 refills | Status: DC

## 2023-10-31 ENCOUNTER — Other Ambulatory Visit: Payer: Medicare Other

## 2023-10-31 ENCOUNTER — Ambulatory Visit: Payer: Medicare Other | Admitting: Hematology

## 2023-10-31 ENCOUNTER — Ambulatory Visit: Payer: Medicare Other

## 2023-10-31 ENCOUNTER — Inpatient Hospital Stay: Payer: Medicare Other | Attending: Hematology

## 2023-10-31 ENCOUNTER — Inpatient Hospital Stay: Payer: Medicare Other

## 2023-10-31 VITALS — BP 122/71 | HR 75 | Temp 97.8°F | Resp 18 | Wt 132.8 lb

## 2023-10-31 DIAGNOSIS — Z79899 Other long term (current) drug therapy: Secondary | ICD-10-CM | POA: Diagnosis not present

## 2023-10-31 DIAGNOSIS — Z5112 Encounter for antineoplastic immunotherapy: Secondary | ICD-10-CM | POA: Insufficient documentation

## 2023-10-31 DIAGNOSIS — C9000 Multiple myeloma not having achieved remission: Secondary | ICD-10-CM | POA: Diagnosis present

## 2023-10-31 DIAGNOSIS — C9001 Multiple myeloma in remission: Secondary | ICD-10-CM

## 2023-10-31 LAB — CMP (CANCER CENTER ONLY)
ALT: 29 U/L (ref 0–44)
AST: 23 U/L (ref 15–41)
Albumin: 3.9 g/dL (ref 3.5–5.0)
Alkaline Phosphatase: 60 U/L (ref 38–126)
Anion gap: 10 (ref 5–15)
BUN: 23 mg/dL (ref 8–23)
CO2: 22 mmol/L (ref 22–32)
Calcium: 8.7 mg/dL — ABNORMAL LOW (ref 8.9–10.3)
Chloride: 105 mmol/L (ref 98–111)
Creatinine: 0.99 mg/dL (ref 0.44–1.00)
GFR, Estimated: >60 mL/min (ref >60)
Glucose, Bld: 128 mg/dL — ABNORMAL HIGH (ref 70–99)
Potassium: 4 mmol/L (ref 3.5–5.1)
Sodium: 137 mmol/L (ref 135–145)
Total Bilirubin: 1.2 mg/dL (ref 0.0–1.2)
Total Protein: 6.2 g/dL — ABNORMAL LOW (ref 6.5–8.1)

## 2023-10-31 LAB — CBC WITH DIFFERENTIAL (CANCER CENTER ONLY)
Abs Immature Granulocytes: 0.01 10*3/uL (ref 0.00–0.07)
Basophils Absolute: 0 10*3/uL (ref 0.0–0.1)
Basophils Relative: 1 %
Eosinophils Absolute: 0.1 10*3/uL (ref 0.0–0.5)
Eosinophils Relative: 4 %
HCT: 41.2 % (ref 36.0–46.0)
Hemoglobin: 13.9 g/dL (ref 12.0–15.0)
Immature Granulocytes: 0 %
Lymphocytes Relative: 18 %
Lymphs Abs: 0.7 10*3/uL (ref 0.7–4.0)
MCH: 31.3 pg (ref 26.0–34.0)
MCHC: 33.7 g/dL (ref 30.0–36.0)
MCV: 92.8 fL (ref 80.0–100.0)
Monocytes Absolute: 0.6 10*3/uL (ref 0.1–1.0)
Monocytes Relative: 15 %
Neutro Abs: 2.4 10*3/uL (ref 1.7–7.7)
Neutrophils Relative %: 62 %
Platelet Count: 239 10*3/uL (ref 150–400)
RBC: 4.44 MIL/uL (ref 3.87–5.11)
RDW: 14.1 % (ref 11.5–15.5)
WBC Count: 3.9 10*3/uL — ABNORMAL LOW (ref 4.0–10.5)
nRBC: 0 % (ref 0.0–0.2)

## 2023-10-31 MED ORDER — ACETAMINOPHEN 325 MG PO TABS
650.0000 mg | ORAL_TABLET | Freq: Once | ORAL | Status: AC
  Administered 2023-10-31: 650 mg via ORAL
  Filled 2023-10-31: qty 2

## 2023-10-31 MED ORDER — DARATUMUMAB-HYALURONIDASE-FIHJ 1800-30000 MG-UT/15ML ~~LOC~~ SOLN
1800.0000 mg | Freq: Once | SUBCUTANEOUS | Status: AC
  Administered 2023-10-31: 1800 mg via SUBCUTANEOUS
  Filled 2023-10-31: qty 15

## 2023-10-31 MED ORDER — FAMOTIDINE 20 MG PO TABS
20.0000 mg | ORAL_TABLET | Freq: Once | ORAL | Status: AC
  Administered 2023-10-31: 20 mg via ORAL
  Filled 2023-10-31: qty 1

## 2023-10-31 MED ORDER — DIPHENHYDRAMINE HCL 25 MG PO CAPS
25.0000 mg | ORAL_CAPSULE | Freq: Once | ORAL | Status: AC
  Administered 2023-10-31: 25 mg via ORAL
  Filled 2023-10-31: qty 1

## 2023-10-31 NOTE — Patient Instructions (Signed)

## 2023-11-05 LAB — MULTIPLE MYELOMA PANEL, SERUM
Albumin SerPl Elph-Mcnc: 3.7 g/dL (ref 2.9–4.4)
Albumin/Glob SerPl: 1.5 (ref 0.7–1.7)
Alpha 1: 0.2 g/dL (ref 0.0–0.4)
Alpha2 Glob SerPl Elph-Mcnc: 0.7 g/dL (ref 0.4–1.0)
B-Globulin SerPl Elph-Mcnc: 1 g/dL (ref 0.7–1.3)
Gamma Glob SerPl Elph-Mcnc: 0.6 g/dL (ref 0.4–1.8)
Globulin, Total: 2.5 g/dL (ref 2.2–3.9)
IgA: 119 mg/dL (ref 87–352)
IgG (Immunoglobin G), Serum: 626 mg/dL (ref 586–1602)
IgM (Immunoglobulin M), Srm: 26 mg/dL (ref 26–217)
Total Protein ELP: 6.2 g/dL (ref 6.0–8.5)

## 2023-11-11 ENCOUNTER — Other Ambulatory Visit: Payer: Self-pay | Admitting: Hematology

## 2023-11-11 DIAGNOSIS — C9001 Multiple myeloma in remission: Secondary | ICD-10-CM

## 2023-11-12 ENCOUNTER — Encounter: Payer: Self-pay | Admitting: Hematology

## 2023-11-26 ENCOUNTER — Other Ambulatory Visit: Payer: Self-pay

## 2023-11-26 DIAGNOSIS — C9001 Multiple myeloma in remission: Secondary | ICD-10-CM

## 2023-11-26 MED ORDER — LENALIDOMIDE 10 MG PO CAPS: ORAL_CAPSULE | ORAL | 0 refills | Status: DC

## 2023-11-28 ENCOUNTER — Inpatient Hospital Stay: Payer: Medicare Other

## 2023-11-28 ENCOUNTER — Inpatient Hospital Stay: Payer: Medicare Other | Attending: Hematology

## 2023-11-28 ENCOUNTER — Inpatient Hospital Stay (HOSPITAL_BASED_OUTPATIENT_CLINIC_OR_DEPARTMENT_OTHER): Payer: Medicare Other | Admitting: Hematology

## 2023-11-28 ENCOUNTER — Other Ambulatory Visit: Payer: Self-pay

## 2023-11-28 VITALS — BP 121/68 | HR 65 | Temp 97.5°F | Resp 18 | Ht 68.0 in | Wt 134.7 lb

## 2023-11-28 DIAGNOSIS — Z5112 Encounter for antineoplastic immunotherapy: Secondary | ICD-10-CM | POA: Diagnosis present

## 2023-11-28 DIAGNOSIS — Z79899 Other long term (current) drug therapy: Secondary | ICD-10-CM | POA: Diagnosis not present

## 2023-11-28 DIAGNOSIS — C9001 Multiple myeloma in remission: Secondary | ICD-10-CM | POA: Diagnosis not present

## 2023-11-28 DIAGNOSIS — C9000 Multiple myeloma not having achieved remission: Secondary | ICD-10-CM | POA: Diagnosis present

## 2023-11-28 LAB — CBC WITH DIFFERENTIAL (CANCER CENTER ONLY)
Abs Immature Granulocytes: 0 10*3/uL (ref 0.00–0.07)
Basophils Absolute: 0.1 10*3/uL (ref 0.0–0.1)
Basophils Relative: 2 %
Eosinophils Absolute: 0.1 10*3/uL (ref 0.0–0.5)
Eosinophils Relative: 4 %
HCT: 41.9 % (ref 36.0–46.0)
Hemoglobin: 14.1 g/dL (ref 12.0–15.0)
Immature Granulocytes: 0 %
Lymphocytes Relative: 19 %
Lymphs Abs: 0.6 10*3/uL — ABNORMAL LOW (ref 0.7–4.0)
MCH: 31.3 pg (ref 26.0–34.0)
MCHC: 33.7 g/dL (ref 30.0–36.0)
MCV: 92.9 fL (ref 80.0–100.0)
Monocytes Absolute: 0.5 10*3/uL (ref 0.1–1.0)
Monocytes Relative: 14 %
Neutro Abs: 2 10*3/uL (ref 1.7–7.7)
Neutrophils Relative %: 61 %
Platelet Count: 234 10*3/uL (ref 150–400)
RBC: 4.51 MIL/uL (ref 3.87–5.11)
RDW: 14.3 % (ref 11.5–15.5)
WBC Count: 3.3 10*3/uL — ABNORMAL LOW (ref 4.0–10.5)
nRBC: 0 % (ref 0.0–0.2)

## 2023-11-28 LAB — CMP (CANCER CENTER ONLY)
ALT: 24 U/L (ref 0–44)
AST: 19 U/L (ref 15–41)
Albumin: 4.3 g/dL (ref 3.5–5.0)
Alkaline Phosphatase: 71 U/L (ref 38–126)
Anion gap: 5 (ref 5–15)
BUN: 15 mg/dL (ref 8–23)
CO2: 29 mmol/L (ref 22–32)
Calcium: 9.1 mg/dL (ref 8.9–10.3)
Chloride: 104 mmol/L (ref 98–111)
Creatinine: 0.74 mg/dL (ref 0.44–1.00)
GFR, Estimated: >60 mL/min (ref >60)
Glucose, Bld: 98 mg/dL (ref 70–99)
Potassium: 4 mmol/L (ref 3.5–5.1)
Sodium: 138 mmol/L (ref 135–145)
Total Bilirubin: 1.1 mg/dL (ref 0.0–1.2)
Total Protein: 6.9 g/dL (ref 6.5–8.1)

## 2023-11-28 MED ORDER — DIPHENHYDRAMINE HCL 25 MG PO CAPS
25.0000 mg | ORAL_CAPSULE | Freq: Once | ORAL | Status: AC
  Administered 2023-11-28: 25 mg via ORAL
  Filled 2023-11-28: qty 1

## 2023-11-28 MED ORDER — FAMOTIDINE 20 MG PO TABS
20.0000 mg | ORAL_TABLET | Freq: Once | ORAL | Status: AC
  Administered 2023-11-28: 20 mg via ORAL
  Filled 2023-11-28: qty 1

## 2023-11-28 MED ORDER — ACETAMINOPHEN 325 MG PO TABS
650.0000 mg | ORAL_TABLET | Freq: Once | ORAL | Status: AC
  Administered 2023-11-28: 650 mg via ORAL
  Filled 2023-11-28: qty 2

## 2023-11-28 MED ORDER — DARATUMUMAB-HYALURONIDASE-FIHJ 1800-30000 MG-UT/15ML ~~LOC~~ SOLN
1800.0000 mg | Freq: Once | SUBCUTANEOUS | Status: AC
  Administered 2023-11-28: 1800 mg via SUBCUTANEOUS
  Filled 2023-11-28: qty 15

## 2023-11-28 NOTE — Progress Notes (Signed)
Patient seen by Dr. Addison Naegeli are within treatment parameters.  Labs reviewed: and are within treatment parameters./ CMP pending  Per physician team, patient is ready for treatment and there are NO modifications to the treatment plan.

## 2023-11-28 NOTE — Progress Notes (Signed)
 HEMATOLOGY/ONCOLOGY CLINIC NOTE  Date of Service: 11/28/23   PCP: Tally Joe, MD Transplant Oncologist -- Dr Eddie Candle MD  CHIEF COMPLAINT: Follow-up for continued evaluation and management of multiple myeloma  DIAGNOSIS: IgA lambda R-ISS stage II multiple myeloma with innumerable lytic lesions in the calvarium, lesion in T7 vertebra and multiple lesions in the pelvis.  Diagnosed in January 2017.   Current treatment: Revlimid 10mg  po daily 3 weeks on 1 week off (after treatment interruption) Daratumumab added 11/15 for loss of MRD negative status   Previous Treatment: Status post Vd x 1 cycle VRd x 6 cycles   HD Melphalan 200mg /m2 on 03/22/2016 Autologous HSCT on 03/23/2016 with Dr Donnie Coffin at [facility] (Dose of 6.4 x10^6/kg)   INTERVAL HISTORY:   Ms. Coutts is a 68 y.o. female is here for continued evaluation and management of multiple myeloma and Daratumumab cycle 18 day 1.   Patient was last seen by me on 09/05/2023 and was doing well overall with no new medical complaints.   She was seen by Dr. Barbaraann Boys on 08/14/2023 and she was recommended to follow back with her in 1 year. She was recommended to continue her current treatment plan at this time.   Today, she reports no new concerns since her last clinical visit. She has been tolerating revlimid and daratumumab well with no toxicity issues.   Patient denies any recent medication changes. She is not on any statins at this time.   Patient denies any infection issues, bone pain, back pain, or abdominal pain. She has been eating and sleeping well.  She plans to travel to [facility] over the Spring/Summer.   REVIEW OF SYSTEMS:    10 Point review of Systems was done is negative except as noted above.   PHYSICAL EXAMINATION: .BP 121/68 (BP Location: Left Arm, Patient Position: Sitting)   Pulse 65   Temp (!) 97.5 F (36.4 C) (Tympanic)   Resp 18   Ht 5\' 8"  (1.727 m)   Wt 134 lb 11.2 oz (61.1 kg)   LMP  09/26/1995 (Within Months)   SpO2 100%   BMI 20.48 kg/m  GENERAL:alert, in no acute distress and comfortable SKIN: no acute rashes, no significant lesions EYES: conjunctiva are pink and non-injected, sclera anicteric OROPHARYNX: MMM, no exudates, no oropharyngeal erythema or ulceration NECK: supple, no JVD LYMPH:  no palpable lymphadenopathy in the cervical, axillary or inguinal regions LUNGS: clear to auscultation b/l with normal respiratory effort HEART: regular rate & rhythm ABDOMEN:  normoactive bowel sounds , non tender, not distended. Extremity: no pedal edema PSYCH: alert & oriented x 3 with fluent speech NEURO: no focal motor/sensory deficits   MEDICAL HISTORY:   Past Medical History:  Diagnosis Date   Abnormal Pap smear of vagina 03/22/2017   ASCUS pap and negative HR HPV.  Patient is on immunosuppressive medication.    Allergic reaction to bee sting    Anemia    AR (aortic regurgitation) 11/30/2017   trace noted on ECHO   Atrial septal aneurysm per 11-30-17 echo   trivial pericardial effusion   Complicated migraine    Constipation    DDD (degenerative disc disease), cervical    DDD (degenerative disc disease), lumbosacral    Fatty liver    Female proctocele without uterine prolapse    Fibroid    reason for Hysterectomy   FTT (failure to thrive) in adult 08/09/2017   Gastroesophageal reflux disease without esophagitis 01/17/2017   Generalized anxiety disorder    Grade  I diastolic dysfunction    Hammer toe 05/13/2018   Hematuria 11/08/2015   History of COVID-19 12/2020   History of stress incontinence    Hypergammaglobulinemia    Hyperlipidemia    Hypoxemia 12/16/2007   Insomnia    Leg length discrepancy    Loss of appetite    Lung nodule    Malignant neoplasm of thymus (HCC) 12/03/2007   Metastatic multiple myeloma to bone (HCC) 10/19/2015   Metatarsalgia of right foot 07/15/2018   MR (mitral regurgitation) 11/30/2017   trace noted on ECHO   Muscular  fasciculation    Nasal septal deviation    Nasal turbinate hypertrophy    Near syncope    Obstructive sleep apnea 12/03/2007   Other fatigue    Peripheral edema    Plasma cell dyscrasia    PONV (postoperative nausea and vomiting)    thinks morphine caused nausea   Postoperative urinary retention 05/14/2018   Prediabetes    Pure hypercholesterolemia    Rash 11/01/2015   Restless leg syndrome    S/P autologous bone marrow transplantation (HCC) 03/29/2016   Shortness of breath 12/04/2007   TR (tricuspid regurgitation) 11/30/2017   trace noted on ECHO   Urinary incontinence    UTI (urinary tract infection)    Vaginal granulation tissue 12/03/2017   Vitamin D deficiency    Wears glasses     SURGICAL HISTORY: Past Surgical History:  Procedure Laterality Date   ABDOMINAL HYSTERECTOMY  1997   TAH--ovaries remain--Dr. Tracey Harries   ANTERIOR AND POSTERIOR REPAIR N/A 05/14/2018   Procedure: ANTERIOR (CYSTOCELE) AND POSTERIOR REPAIR (RECTOCELE);  Surgeon: Patton Salles, MD;  Location: [facility];  Service: Gynecology;  Laterality: N/A;   BLADDER SUSPENSION N/A 05/14/2018   Procedure: TRANSVAGINAL TAPE (TVT) PROCEDURE exact midurethral sling;  Surgeon: Patton Salles, MD;  Location: [facility];  Service: Gynecology;  Laterality: N/A;   BONE MARROW TRANSPLANT  2017   done at [facility]   BREAST BIOPSY     COLONOSCOPY     CYSTOSCOPY N/A 05/14/2018   Procedure: CYSTOSCOPY;  Surgeon: Patton Salles, MD;  Location: [facility];  Service: Gynecology;  Laterality: N/A;   CYSTOSCOPY N/A 05/30/2018   Procedure: CYSTOSCOPY;  Surgeon: Patton Salles, MD;  Location: [facility];  Service: Gynecology;  Laterality: N/A;   ROBOTIC ASSISTED LAPAROSCOPIC SACROCOLPOPEXY N/A 05/14/2018   Procedure: XI ROBOTIC ASSISTED LAPAROSCOPIC SACROCOLPOPEXY;  Surgeon: Patton Salles, MD;  Location: [facility];  Service: Gynecology;  Laterality: N/A;  6 hours OR time. Need  extended stay recovery bed.   SALPINGOOPHORECTOMY Bilateral 05/14/2018   Procedure: SALPINGO OOPHORECTOMY;  Surgeon: Patton Salles, MD;  Location: [facility];  Service: Gynecology;  Laterality: Bilateral;   THYMECTOMY  1999   TRANSVAGINAL TAPE (TVT) REMOVAL N/A 05/30/2018   Procedure: TRANSVAGINAL TAPE (TVT)  Revision;  Surgeon: Patton Salles, MD;  Location: [facility];  Service: Gynecology;  Laterality: N/A;   WISDOM TOOTH EXTRACTION      SOCIAL HISTORY: Social History   Socioeconomic History   Marital status: Married    Spouse name: Leonette Most   Number of children: 2   Years of education: 16   Highest education level: Bachelor's degree (e.g., BA, AB, BS)  Occupational History   Occupation: Retired    Comment: [employer]  Tobacco Use   Smoking status: Never   Smokeless tobacco:  Never  Vaping Use   Vaping status: Never Used  Substance and Sexual Activity   Alcohol use: Not Currently    Alcohol/week: 0.0 standard drinks of alcohol   Drug use: Never   Sexual activity: Not Currently    Partners: Male    Birth control/protection: Surgical    Comment: TAH--ovaries remain  Other Topics Concern   Not on file  Social History Narrative   Lives with husband   Caffeine- coffee, 2 cups daily   Right handed   Social Drivers of Corporate investment banker Strain: Not on file  Food Insecurity: Not on file  Transportation Needs: Not on file  Physical Activity: Not on file  Stress: Not on file  Social Connections: Not on file  Intimate Partner Violence: Not on file    FAMILY HISTORY: Family History  Problem Relation Age of Onset   Hyperlipidemia Mother    Memory loss Mother        7s; believed to be age-related memory decline   COPD Father        dec age 55/s-smoked   Macular degeneration Father    Behavior problems Father        at the end of life   Diabetes Brother    Macular degeneration Brother    Suicidality Brother     Diabetes Maternal Grandmother    Heart attack Paternal Grandfather     ALLERGIES:  is allergic to ampicillin and penicillins.  MEDICATIONS:  . Current Outpatient Medications:    acyclovir (ZOVIRAX) 400 MG tablet, Take 1 tablet by mouth twice daily, Disp: 60 tablet, Rfl: 0   Ascorbic Acid (VITAMIN C) 1000 MG tablet, Take 1,000 mg by mouth 2 (two) times daily., Disp: , Rfl:    aspirin EC 81 MG tablet, Take 81 mg by mouth daily., Disp: , Rfl:    b complex vitamins capsule, Take 1 capsule by mouth daily., Disp: , Rfl:    Cholecalciferol (VITAMIN D3) 30 MCG/15ML LIQD, Take by mouth. Pt. Takes 5000 IU daily, Disp: , Rfl:    Cyanocobalamin (B-12) 1000 MCG CAPS, , Disp: , Rfl:    dexamethasone (DECADRON) 4 MG tablet, Take 2 tablets (8 mg) the day of chemo treatment. (every 14 days), Disp: 20 tablet, Rfl: 2   EPINEPHrine 0.3 mg/0.3 mL IJ SOAJ injection, INJECT INTRAMUSCULARLLY AS DIRECTED AS NEEDED FOR ANAPHYLAXIS, Disp: , Rfl:    estradiol (ESTRACE) 0.1 MG/GM vaginal cream, INSERT  1 GRAM VAGINALLY TWICE A WEEK AT BEDTIME, Disp: 43 g, Rfl: 1   Iron, Ferrous Sulfate, 325 (65 Fe) MG TABS, Take 325 mg by mouth daily., Disp: , Rfl:    lenalidomide (REVLIMID) 10 MG capsule, TAKE ONE CAPSULE BY MOUTH DAILY FOR 21 DAYS, THEN 7 DAYS OFF, Disp: 21 capsule, Rfl: 0   LORazepam (ATIVAN) 0.5 MG tablet, Take 1 tablet (0.5 mg total) by mouth at bedtime as needed for sleep or anxiety., Disp: 30 tablet, Rfl: 0   Magnesium 400 MG CAPS, Take by mouth., Disp: , Rfl:    Multiple Vitamins-Minerals (PRESERVISION AREDS PO), Take 1 capsule by mouth 2 (two) times daily., Disp: , Rfl:    ondansetron (ZOFRAN) 8 MG tablet, Take 1 tablet (8 mg total) by mouth every 8 (eight) hours as needed for nausea or vomiting., Disp: 30 tablet, Rfl: 1   prochlorperazine (COMPAZINE) 10 MG tablet, Take 1 tablet (10 mg total) by mouth every 6 (six) hours as needed for nausea or vomiting., Disp: 30 tablet, Rfl:  1   LABORATORY DATA:  I have  reviewed the data as listed     Latest Ref Rng & Units 11/28/2023   10:39 AM 10/31/2023    2:25 PM 10/03/2023   11:29 AM  CBC  WBC 4.0 - 10.5 K/uL 3.3  3.9  2.9   Hemoglobin 12.0 - 15.0 g/dL 08.6  57.8  46.9   Hematocrit 36.0 - 46.0 % 41.9  41.2  40.0   Platelets 150 - 400 K/uL 234  239  237       Latest Ref Rng & Units 11/28/2023   10:48 AM 10/31/2023    2:25 PM 10/03/2023   11:29 AM  CMP  Glucose 70 - 99 mg/dL 98  629  528   BUN 8 - 23 mg/dL 15  23  17    Creatinine 0.44 - 1.00 mg/dL 4.13  2.44  0.10   Sodium 135 - 145 mmol/L 138  137  139   Potassium 3.5 - 5.1 mmol/L 4.0  4.0  3.9   Chloride 98 - 111 mmol/L 104  105  106   CO2 22 - 32 mmol/L 29  22  27    Calcium 8.9 - 10.3 mg/dL 9.1  8.7  9.2   Total Protein 6.5 - 8.1 g/dL 6.9  6.2  6.2   Total Bilirubin 0.0 - 1.2 mg/dL 1.1  1.2  1.1   Alkaline Phos 38 - 126 U/L 71  60  77   AST 15 - 41 U/L 19  23  32   ALT 0 - 44 U/L 24  29  66      01/10/2023 Bone Marrow Biopsy:    10/10/2019 Flow Cytometry (Repeat BM Bx from [facility]):   10/07/18 Repeat BM Bx from [facility]:   Surgical Pathology                                Case: [id number]                               Authorizing Provider:  Rodney Langton, NP    Collected:           07/04/2022 1015             Ordering Location:     [facility] and   Received:            07/04/2022 1048                                    [facility]                                                   Pathologist:           Delilah Shan, MD                                                   Specimens:   A) - Bone Marrow, Aspirate  B) - Bone Marrow, Biopsy                                                                          C) - Bone Marrow, Clot                                                                 DIAGNOSIS   A-C. Peripheral blood and bone marrow (peripheral smear, aspirate, touch preparation,  core biopsy, and clot section): - Low-level plasma cell neoplasm, see comment.   COMMENT: The core biopsy is 40% cellular. Cyclin-D1 immunohistochemical stain demonstrates very rare positive plasma cells (1%). Concurrent flow cytometry study ([id number]) shows a plasma cell population with an atypical immunophenotype, comprising approximately 0.04% of analyzed events.    Reviewed by resident/fellow Zachery Conch  Electronically signed by Delilah Shan, MD on 07/10/2022 at 12:25 PM  Clinical Information   68 year old female with history of plasma cell myeloma.  Gross Examination   A. Peripheral blood and bone marrow labeled with patient's name and medical record number, one peripheral blood smear, three bone marrow aspirate smears and one bone marrow touch preparation are received.   B. "Bone marrow biopsy", in AZF.  Core biopsies of red-brown tissue 1.4 x 0.2 cm in aggregate are submitted in total as block B1 for decalcification.   C. "Bone marrow clot", in AZF.  A 1.8 x 1.8 x 0.3 cm aggregate of red-brown clot is submitted in total as block C1.    J.Elam/ Dr. Alvera Singh   Microscopic Examination        Lab Results  Component Value Date    WBC 5.2 07/04/2022    HGB 12.1 07/04/2022    PLT 364 07/04/2022    MCV 95 07/04/2022    RDWCV 14.6 (H) 07/04/2022    Peripheral blood and automated blood count:  RBCs: The red blood cells are adequate in number,  normocytic and normochromic, with minimal anisopoikilocytosis. WBCs: The white blood cells are adequate in number and show unremarkable morphology. Platelets: The platelets are adequate in number and show unremarkable morphology.   Bone marrow aspirate:  There are adequate cellular particles. M:E ratio is 3:1.  Granulopoiesis: Maturing myeloid elements are present and show complete maturation. Erythropoiesis:  Erythroid elements are present and show complete maturation. Megakaryocytes: Megakaryocytes are adequate in number and  show unremarkable morphology.  There are slightly increased plasma cells with unremarkable morphology.   Bone marrow touch preparation: Adequate marrow elements are present, without additional findings to the aspirate smears.   Prussian blue stained aspirate smear: 0 of 4 stainable iron is present. No ringed sideroblasts are identified.   Bone marrow core biopsy:  The core biopsy is adequate for evaluation. The cellularity is 40%. Maturing myeloid elements are present and show complete maturation. Maturing erythroid elements are present and show complete maturation.  Megakaryocytes are adequate in number and show unremarkable morphology. There are slightly increased plasma cells.   A reticulin stain of the core biopsy shows no  significant reticulin fibrosis.   Aspirate clot section: There are adequate particles present, without additional findings to the core biopsy.   Immunohistochemical stains for Cyclin-D1, CD138, Kappa CISH, and Lambda CISH are performed on the core biopsy section (block B1). CD138, kappa, and lambda in-situ hybridization demonstrates 5-10% predominantly polytypic plasma cells. Cyclin-D1 demonstrates very rare positive plasma cells (1%).  Bone Marrow Differential   500 CELL DIFF   Cell Type Cell Count Reference Range Cell Type Cell Count Reference Range Cell Type Cell Count Reference Range  Segs 23 (7-25) Lymphocytes 15 (3-20) Plasma Cells 8 (0-3.5)  Bands 3 (6-36)     Atypicals 0   RBC Precursors 22 (10-30)  Metas 2 (9-25) Lymphoblasts *   Pronormoblasts 0 (0-3)  Myelos 5 (8-15) Eosinophils 12 (0-4) M:E Ratio 3/1    Promyelos 1 (1-6) Basophils 0 (0-1) Iron 0 (Scale 0-4+)  Myeloblasts 1 (0-3.5) Monocytes 8 (0-2)                                                                                                     Performed by:    Maryclare Bean                                                                                                         July 04, 2022  12:21 PM  Additional Documentation   All immunohistochemistry, in situ hybridization tests and special stains performed at [facility] and reported herein were developed, validated and their performance characteristics determined by the [facility]. During the performance of these tests, appropriate positive and negative control slides are also performed and reviewed. All control slides and internal controls (when applicable) demonstrate the expected immunoreactive patterns and/or nucleic acid hybridization. These ancillary studies were deemed medically necessary by the requesting pathologist. They were ordered following review of the H&E and clinical history except when part of a liver/kidney protocol or where clinical history (e.g. immunocompromised, critically ill, history of malignancy) clearly indicates. Some of the tests may not be cleared or approved by the U.S. Food and Drug Administration (FDA).  The FDA has determined that such clearance or approval is not necessary.  These tests are used for clinical purposes and should not be regarded as investigational or as research.  This laboratory is certified under the Clinical Laboratory Improvement Amendments of 1988 (CLIA) as qualified to perform high complexity clinical testing. At least one block in this case underwent decalcification during its processing. The vast majority of immunohistochemical and hybridization assays were not validated on decalcified tissues. The results were interpreted with caution given the possibility of false negative results on decalcified specimens.  Attestation   All of the diagnostic evaluations on the enumerated specimens have been personally conducted by the pathologists involved in the care of this patient as indicated by the electronic signatures above.  Resulting Agency [facility] SURGICAL PATHOLOGY AND CYTOPATHOLOGY   Specimen Collected: 07/04/22 10:15   Performed by: [facility] SURGICAL  PATHOLOGY AND CYTOPATHOLOGY Last Resulted: 07/10/22 12:25  Received From: [facility]  Result Received: 07/14/22 09:58   View Encounter      Received Information Pathology - Bone Marrow (Order [id number])    suggestion  Information displayed in this report may not trend or trigger automated decision support.    Pathology - Bone Marrow Order: [id number] Component 4 wk ago  Case Report Surgical Pathology                                Case: [id number]                               Authorizing Provider:  Rodney Langton, NP    Collected:           07/04/2022 1015             Ordering Location:     [facility] and   Received:            07/04/2022 1048                                    [facility]                                                   Pathologist:           Delilah Shan, MD                                                   Specimens:   A) - Bone Marrow, Aspirate                                                                        B) - Bone Marrow, Biopsy                                                                          C) - Bone Marrow, Clot  DIAGNOSIS   A-C. Peripheral blood and bone marrow (peripheral smear, aspirate, touch preparation, core biopsy, and clot section): - Low-level plasma cell neoplasm, see comment.   COMMENT: The core biopsy is 40% cellular. Cyclin-D1 immunohistochemical stain demonstrates very rare positive plasma cells (1%). Concurrent flow cytometry study ([id number]) shows a plasma cell population with an atypical immunophenotype, comprising approximately 0.04% of analyzed events.    Reviewed by resident/fellow Zachery Conch  Electronically signed by Delilah Shan, MD on 07/10/2022 at 12:25 PM  Clinical Information   68 year old female with history of plasma cell myeloma.  Gross Examination   A. Peripheral blood and bone  marrow labeled with patient's name and medical record number, one peripheral blood smear, three bone marrow aspirate smears and one bone marrow touch preparation are received.   B. "Bone marrow biopsy", in AZF.  Core biopsies of red-brown tissue 1.4 x 0.2 cm in aggregate are submitted in total as block B1 for decalcification.   C. "Bone marrow clot", in AZF.  A 1.8 x 1.8 x 0.3 cm aggregate of red-brown clot is submitted in total as block C1.    J.Elam/ Dr. Alvera Singh     RADIOGRAPHIC STUDIES: I have personally reviewed the radiological images as listed and agreed with the findings in the report. No results found.  ASSESSMENT & PLAN:   68 y.o. caucasian female with  1. H/o Multiple myeloma - currently in remission.  But bone marrow biopsy shows she has turned MRD positive  2. IgA lambda R-ISS stage II multiple myeloma with innumerable lytic lesions in the calvarium, lesion in T7 vertebra and multiple lesions in the pelvis.  No significant bone pain at this time. Diagnosed in January 2017.  s/p treatment as noted above 10/07/18 BM Bx and Flow cytometry indicate that the pt continues to be in CR 10/10/2019 Flow Cytometry revealed "A. Bone Marrow (Plasma Cell Myeloma Minimal Residual Disease Detection by Flow Cytometry): Negative. No phenotypically abnormal plasma cells at or above the limit of detection identified."  PLAN:   -Discussed lab results on 11/28/23 in detail with patient. CBC showed WBC of 3.3K, hemoglobin of 14.1, and platelets of 234K. -CMP pending at time of clinical visit -myeloma panel from 4 weeks ago showed that her M protein continued to be undetectable -Patient has been tolerating her treatment well without any new or severe toxicities.  -continue maintenance Revlimid and once a month daratumumab -Continue acyclovir for VZV prophylaxis -Continue aspirin for VTE prophylaxis -patient has a 1-year follow up visit with Dr. Barbaraann Boys in November -answered all of patient's  questions in detail  FOLLOW-UP:  -continue monthly Dara as per integrated scheduling -Next MD Visit in 3 months.  The total time spent in the appointment was 21 minutes* .  All of the patient's questions were answered with apparent satisfaction. The patient knows to call the clinic with any problems, questions or concerns.   Wyvonnia Lora MD MS AAHIVMS [facility] Hematology/Oncology Physician [facility]  .*Total Encounter Time as defined by the Centers for Medicare and Medicaid Services includes, in addition to the face-to-face time of a patient visit (documented in the note above) non-face-to-face time: obtaining and reviewing outside history, ordering and reviewing medications, tests or procedures, care coordination (communications with other health care professionals or caregivers) and documentation in the medical record.    I,Mitra Faeizi,acting as a Neurosurgeon for Wyvonnia Lora, MD.,have documented all relevant documentation on the behalf of Wyvonnia Lora, MD,as directed by  Wyvonnia Lora, MD  while in the presence of Wyvonnia Lora, MD.   .I have reviewed the above documentation for accuracy and completeness, and I agree with the above. Johney Maine MD

## 2023-11-30 LAB — MULTIPLE MYELOMA PANEL, SERUM
Albumin SerPl Elph-Mcnc: 3.6 g/dL (ref 2.9–4.4)
Albumin/Glob SerPl: 1.6 (ref 0.7–1.7)
Alpha 1: 0.2 g/dL (ref 0.0–0.4)
Alpha2 Glob SerPl Elph-Mcnc: 0.7 g/dL (ref 0.4–1.0)
B-Globulin SerPl Elph-Mcnc: 1 g/dL (ref 0.7–1.3)
Gamma Glob SerPl Elph-Mcnc: 0.5 g/dL (ref 0.4–1.8)
Globulin, Total: 2.4 g/dL (ref 2.2–3.9)
IgA: 117 mg/dL (ref 87–352)
IgG (Immunoglobin G), Serum: 608 mg/dL (ref 586–1602)
IgM (Immunoglobulin M), Srm: 25 mg/dL — ABNORMAL LOW (ref 26–217)
Total Protein ELP: 6 g/dL (ref 6.0–8.5)

## 2023-12-04 ENCOUNTER — Telehealth: Payer: Self-pay | Admitting: Cardiology

## 2023-12-04 ENCOUNTER — Encounter: Payer: Self-pay | Admitting: Hematology

## 2023-12-04 NOTE — Telephone Encounter (Signed)
 Patient c/o Palpitations:  STAT if patient reporting lightheadedness, shortness of breath, or chest pain  How long have you had palpitations/irregular HR/ Afib? Are you having the symptoms now? Heart has been skipping/ fluttering for about a month   Are you currently experiencing lightheadedness, SOB or CP? No  Do you have a history of afib (atrial fibrillation) or irregular heart rhythm? NO  Have you checked your BP or HR? (document readings if available): NO  Are you experiencing any other symptoms? No

## 2023-12-04 NOTE — Telephone Encounter (Signed)
 Patient identification verified by 2 forms. Shade Flood, RN     Called and spoke to patient  Patient states:  - This past Sunday felt heart " skipping a beat". Puts hand on neck and felt like pulse stop briefly. Happened several times today and can last for 30-40 minutes.  - HR at time of call 77 bpm. BP 151/91  - patient attributes HR and BP to carrying water before call.   Patient denies:  - SOB, dizziness, headache, vision changes,              Interventions/Plan: - Patient scheduled sooner to see Dr. Jens Som in [place] end of march.   Reviewed ED warning signs/precautions  Patient agrees with plan, no questions at this time

## 2023-12-06 ENCOUNTER — Other Ambulatory Visit: Payer: Self-pay | Admitting: Hematology

## 2023-12-06 DIAGNOSIS — C9001 Multiple myeloma in remission: Secondary | ICD-10-CM

## 2023-12-07 ENCOUNTER — Encounter: Payer: Self-pay | Admitting: Hematology

## 2023-12-10 NOTE — Progress Notes (Signed)
 HPI: Follow-up history of aortic insufficiency and atrial septal aneurysm. Cardiac catheterization in 2009 showed no coronary disease. Echocardiogram April 2024 showed normal LV function, mild to moderate aortic insufficiency.  Since last seen over the past 2 months she has had intermittent palpitations described as her heart skipping.  She otherwise denies dyspnea on exertion, orthopnea, PND, pedal edema, chest pain or syncope.  Current Outpatient Medications  Medication Sig Dispense Refill   acyclovir (ZOVIRAX) 400 MG tablet Take 1 tablet by mouth twice daily 60 tablet 0   Ascorbic Acid (VITAMIN C) 1000 MG tablet Take 1,000 mg by mouth 2 (two) times daily.     aspirin EC 81 MG tablet Take 81 mg by mouth daily.     b complex vitamins capsule Take 1 capsule by mouth daily.     Cholecalciferol (VITAMIN D3) 30 MCG/15ML LIQD Take by mouth. Pt. Takes 5000 IU daily     Cyanocobalamin (B-12) 1000 MCG CAPS      EPINEPHrine 0.3 mg/0.3 mL IJ SOAJ injection INJECT INTRAMUSCULARLLY AS DIRECTED AS NEEDED FOR ANAPHYLAXIS     estradiol (ESTRACE) 0.1 MG/GM vaginal cream INSERT  1 GRAM VAGINALLY TWICE A WEEK AT BEDTIME 43 g 1   Iron, Ferrous Sulfate, 325 (65 Fe) MG TABS Take 325 mg by mouth daily.     lenalidomide (REVLIMID) 10 MG capsule TAKE ONE CAPSULE BY MOUTH DAILY FOR 21 DAYS, THEN 7 DAYS OFF 21 capsule 0   LORazepam (ATIVAN) 0.5 MG tablet Take 1 tablet (0.5 mg total) by mouth at bedtime as needed for sleep or anxiety. 30 tablet 0   Magnesium 400 MG CAPS Take by mouth.     Multiple Vitamins-Minerals (PRESERVISION AREDS PO) Take 1 capsule by mouth 2 (two) times daily.     No current facility-administered medications for this visit.     Past Medical History:  Diagnosis Date   Abnormal Pap smear of vagina 03/22/2017   ASCUS pap and negative HR HPV.  Patient is on immunosuppressive medication.    Allergic reaction to bee sting    Anemia    AR (aortic regurgitation) 11/30/2017   trace noted on  ECHO   Atrial septal aneurysm per 11-30-17 echo   trivial pericardial effusion   Complicated migraine    Constipation    DDD (degenerative disc disease), cervical    DDD (degenerative disc disease), lumbosacral    Fatty liver    Female proctocele without uterine prolapse    Fibroid    reason for Hysterectomy   FTT (failure to thrive) in adult 08/09/2017   Gastroesophageal reflux disease without esophagitis 01/17/2017   Generalized anxiety disorder    Grade I diastolic dysfunction    Hammer toe 05/13/2018   Hematuria 11/08/2015   History of COVID-19 12/2020   History of stress incontinence    Hypergammaglobulinemia    Hyperlipidemia    Hypoxemia 12/16/2007   Insomnia    Leg length discrepancy    Loss of appetite    Lung nodule    Malignant neoplasm of thymus (HCC) 12/03/2007   Metastatic multiple myeloma to bone (HCC) 10/19/2015   Metatarsalgia of right foot 07/15/2018   MR (mitral regurgitation) 11/30/2017   trace noted on ECHO   Muscular fasciculation    Nasal septal deviation    Nasal turbinate hypertrophy    Near syncope    Obstructive sleep apnea 12/03/2007   Other fatigue    Peripheral edema    Plasma cell dyscrasia  PONV (postoperative nausea and vomiting)    thinks morphine caused nausea   Postoperative urinary retention 05/14/2018   Prediabetes    Pure hypercholesterolemia    Rash 11/01/2015   Restless leg syndrome    S/P autologous bone marrow transplantation (HCC) 03/29/2016   Shortness of breath 12/04/2007   TR (tricuspid regurgitation) 11/30/2017   trace noted on ECHO   Urinary incontinence    UTI (urinary tract infection)    Vaginal granulation tissue 12/03/2017   Vitamin D deficiency    Wears glasses     Past Surgical History:  Procedure Laterality Date   ABDOMINAL HYSTERECTOMY  1997   TAH--ovaries remain--Dr. Tracey Harries   ANTERIOR AND POSTERIOR REPAIR N/A 05/14/2018   Procedure: ANTERIOR (CYSTOCELE) AND POSTERIOR REPAIR (RECTOCELE);   Surgeon: Patton Salles, MD;  Location: [facility];  Service: Gynecology;  Laterality: N/A;   BLADDER SUSPENSION N/A 05/14/2018   Procedure: TRANSVAGINAL TAPE (TVT) PROCEDURE exact midurethral sling;  Surgeon: Patton Salles, MD;  Location: [facility];  Service: Gynecology;  Laterality: N/A;   BONE MARROW TRANSPLANT  2017   done at [facility]   BREAST BIOPSY     COLONOSCOPY     CYSTOSCOPY N/A 05/14/2018   Procedure: CYSTOSCOPY;  Surgeon: Patton Salles, MD;  Location: [facility];  Service: Gynecology;  Laterality: N/A;   CYSTOSCOPY N/A 05/30/2018   Procedure: CYSTOSCOPY;  Surgeon: Patton Salles, MD;  Location: [facility];  Service: Gynecology;  Laterality: N/A;   ROBOTIC ASSISTED LAPAROSCOPIC SACROCOLPOPEXY N/A 05/14/2018   Procedure: XI ROBOTIC ASSISTED LAPAROSCOPIC SACROCOLPOPEXY;  Surgeon: Patton Salles, MD;  Location: [facility];  Service: Gynecology;  Laterality: N/A;  6 hours OR time. Need extended stay recovery bed.   SALPINGOOPHORECTOMY Bilateral 05/14/2018   Procedure: SALPINGO OOPHORECTOMY;  Surgeon: Patton Salles, MD;  Location: [facility];  Service: Gynecology;  Laterality: Bilateral;   THYMECTOMY  1999   TRANSVAGINAL TAPE (TVT) REMOVAL N/A 05/30/2018   Procedure: TRANSVAGINAL TAPE (TVT)  Revision;  Surgeon: Patton Salles, MD;  Location: [facility];  Service: Gynecology;  Laterality: N/A;   WISDOM TOOTH EXTRACTION      Social History   Socioeconomic History   Marital status: Married    Spouse name: Leonette Most   Number of children: 2   Years of education: 16   Highest education level: Bachelor's degree (e.g., BA, AB, BS)  Occupational History   Occupation: Retired    Comment: Engineer, 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  Tobacco Use   Smoking status: Never   Smokeless tobacco: Never  Vaping Use   Vaping status: Never Used  Substance and Sexual Activity   Alcohol use: Not Currently    Alcohol/week: 0.0 standard  drinks of alcohol   Drug use: Never   Sexual activity: Not Currently    Partners: Male    Birth control/protection: Surgical    Comment: TAH--ovaries remain  Other Topics Concern   Not on file  Social History Narrative   Lives with husband   Caffeine- coffee, 2 cups daily   Right handed   Social Drivers of Corporate investment banker Strain: Not on file  Food Insecurity: Not on file  Transportation Needs: Not on file  Physical Activity: Not on file  Stress: Not on file  Social Connections: Not on file  Intimate Partner Violence: Not on file    Family History  Problem Relation  Age of Onset   Hyperlipidemia Mother    Memory loss Mother        19s; believed to be age-related memory decline   COPD Father        dec age 68/s-smoked   Macular degeneration Father    Behavior problems Father        at the end of life   Diabetes Brother    Macular degeneration Brother    Suicidality Brother    Diabetes Maternal Grandmother    Heart attack Paternal Grandfather     ROS: no fevers or chills, productive cough, hemoptysis, dysphasia, odynophagia, melena, hematochezia, dysuria, hematuria, rash, seizure activity, orthopnea, PND, pedal edema, claudication. Remaining systems are negative.  Physical Exam: Well-developed well-nourished in no acute distress.  Skin is warm and dry.  HEENT is normal.  Neck is supple.  Chest is clear to auscultation with normal expansion.  Cardiovascular exam is regular rate and rhythm.  Abdominal exam nontender or distended. No masses palpated. Extremities show no edema. neuro grossly intact  EKG Interpretation Date/Time:  Thursday December 20 2023 07:49:58 EDT Ventricular Rate:  96 PR Interval:  134 QRS Duration:  124 QT Interval:  370 QTC Calculation: 467 R Axis:   87  Text Interpretation: Normal sinus rhythm Right atrial enlargement Right bundle branch block Confirmed by Olga Millers ([id number]) on 12/20/2023 7:52:59 AM    A/P  1 aortic  insufficiency-mild to moderate on most recent echocardiogram.  Will repeat echo.   2 hyperlipidemia-managed by primary care.  3 sleep apnea-she uses oral prosthesis.  4 palpitations-Zio patch is in place.  Will await results.  Description sounds likely PVCs.  If this is documented on her monitor we will consider addition of beta-blocker if her symptoms are severe in the future.  We also talked about the possibility of a smart watch to record any rhythm strips associated with symptoms in the future.  Olga Millers, MD

## 2023-12-11 ENCOUNTER — Ambulatory Visit: Attending: Cardiology

## 2023-12-11 ENCOUNTER — Encounter: Payer: Self-pay | Admitting: Cardiology

## 2023-12-11 DIAGNOSIS — R002 Palpitations: Secondary | ICD-10-CM

## 2023-12-11 NOTE — Progress Notes (Unsigned)
Enrolled patient for a 14 day ZIo XT monitor to be mailed to patients home  °

## 2023-12-19 ENCOUNTER — Other Ambulatory Visit: Payer: Self-pay

## 2023-12-19 DIAGNOSIS — C9001 Multiple myeloma in remission: Secondary | ICD-10-CM

## 2023-12-19 MED ORDER — LENALIDOMIDE 10 MG PO CAPS: ORAL_CAPSULE | ORAL | 0 refills | Status: DC

## 2023-12-20 ENCOUNTER — Encounter: Payer: Self-pay | Admitting: Cardiology

## 2023-12-20 ENCOUNTER — Ambulatory Visit: Attending: Cardiology | Admitting: Cardiology

## 2023-12-20 VITALS — BP 118/64 | HR 96 | Ht 68.0 in | Wt 129.4 lb

## 2023-12-20 DIAGNOSIS — E7800 Pure hypercholesterolemia, unspecified: Secondary | ICD-10-CM | POA: Diagnosis present

## 2023-12-20 DIAGNOSIS — R002 Palpitations: Secondary | ICD-10-CM | POA: Diagnosis present

## 2023-12-20 DIAGNOSIS — I359 Nonrheumatic aortic valve disorder, unspecified: Secondary | ICD-10-CM | POA: Insufficient documentation

## 2023-12-20 DIAGNOSIS — G4733 Obstructive sleep apnea (adult) (pediatric): Secondary | ICD-10-CM | POA: Insufficient documentation

## 2023-12-20 NOTE — Patient Instructions (Signed)
 Medication Instructions:  Your physician recommends that you continue on your current medications as directed. Please refer to the Current Medication list given to you today.  *If you need a refill on your cardiac medications before your next appointment, please call your pharmacy*   Testing/Procedures: Your physician has requested that you have an echocardiogram. Echocardiography is a painless test that uses sound waves to create images of your heart. It provides your doctor with information about the size and shape of your heart and how well your heart's chambers and valves are working. This procedure takes approximately one hour. There are no restrictions for this procedure. Please do NOT wear cologne, perfume, aftershave, or lotions (deodorant is allowed). Please arrive 15 minutes prior to your appointment time.  Please note: We ask at that you not bring children with you during ultrasound (echo/ vascular) testing. Due to room size and safety concerns, children are not allowed in the ultrasound rooms during exams. Our front office staff cannot provide observation of children in our lobby area while testing is being conducted. An adult accompanying a patient to their appointment will only be allowed in the ultrasound room at the discretion of the ultrasound technician under special circumstances. We apologize for any inconvenience.    Follow-Up: At [facility], you and your health needs are our priority.  As part of our continuing mission to provide you with exceptional heart care, we have created designated Provider Care Teams.  These Care Teams include your primary Cardiologist (physician) and Advanced Practice Providers (APPs -  Physician Assistants and Nurse Practitioners) who all work together to provide you with the care you need, when you need it.  We recommend signing up for the patient portal called "MyChart".  Sign up information is provided on this After Visit Summary.   MyChart is used to connect with patients for Virtual Visits (Telemedicine).  Patients are able to view lab/test results, encounter notes, upcoming appointments, etc.  Non-urgent messages can be sent to your provider as well.   To learn more about what you can do with MyChart, go to [email or web address].    Your next appointment:   12 month(s)  Provider:   Olga Millers, MD      Other Instructions   1st Floor: - Lobby - Registration  - Pharmacy  - Lab - Cafe  2nd Floor: - PV Lab - Diagnostic Testing (echo, CT, nuclear med)  3rd Floor: - Vacant  4th Floor: - TCTS (cardiothoracic surgery) - AFib Clinic - Structural Heart Clinic - Vascular Surgery  - Vascular Ultrasound  5th Floor: - [facility] (general and EP) - Clinical Pharmacy for coumadin, hypertension, lipid, weight-loss medications, and med management appointments    Valet parking services will be available as well.

## 2023-12-26 ENCOUNTER — Inpatient Hospital Stay

## 2023-12-26 ENCOUNTER — Ambulatory Visit: Admitting: Hematology

## 2023-12-26 ENCOUNTER — Inpatient Hospital Stay: Attending: Hematology

## 2023-12-26 ENCOUNTER — Other Ambulatory Visit

## 2023-12-26 VITALS — BP 127/69 | HR 66 | Temp 97.9°F | Resp 16 | Wt 131.5 lb

## 2023-12-26 DIAGNOSIS — M25471 Effusion, right ankle: Secondary | ICD-10-CM | POA: Insufficient documentation

## 2023-12-26 DIAGNOSIS — C9001 Multiple myeloma in remission: Secondary | ICD-10-CM

## 2023-12-26 DIAGNOSIS — Z79899 Other long term (current) drug therapy: Secondary | ICD-10-CM | POA: Diagnosis not present

## 2023-12-26 DIAGNOSIS — G629 Polyneuropathy, unspecified: Secondary | ICD-10-CM | POA: Insufficient documentation

## 2023-12-26 DIAGNOSIS — Z5112 Encounter for antineoplastic immunotherapy: Secondary | ICD-10-CM | POA: Insufficient documentation

## 2023-12-26 LAB — COMPREHENSIVE METABOLIC PANEL WITH GFR
ALT: 21 U/L (ref 0–44)
AST: 15 U/L (ref 15–41)
Albumin: 4.4 g/dL (ref 3.5–5.0)
Alkaline Phosphatase: 71 U/L (ref 38–126)
Anion gap: 6 (ref 5–15)
BUN: 16 mg/dL (ref 8–23)
CO2: 30 mmol/L (ref 22–32)
Calcium: 9.4 mg/dL (ref 8.9–10.3)
Chloride: 104 mmol/L (ref 98–111)
Creatinine, Ser: 0.77 mg/dL (ref 0.44–1.00)
GFR, Estimated: >60 mL/min (ref >60)
Glucose, Bld: 86 mg/dL (ref 70–99)
Potassium: 3.8 mmol/L (ref 3.5–5.1)
Sodium: 140 mmol/L (ref 135–145)
Total Bilirubin: 1 mg/dL (ref 0.0–1.2)
Total Protein: 6.9 g/dL (ref 6.5–8.1)

## 2023-12-26 LAB — CBC WITH DIFFERENTIAL (CANCER CENTER ONLY)
Abs Immature Granulocytes: 0.01 10*3/uL (ref 0.00–0.07)
Basophils Absolute: 0.1 10*3/uL (ref 0.0–0.1)
Basophils Relative: 2 %
Eosinophils Absolute: 0.2 10*3/uL (ref 0.0–0.5)
Eosinophils Relative: 5 %
HCT: 42 % (ref 36.0–46.0)
Hemoglobin: 13.8 g/dL (ref 12.0–15.0)
Immature Granulocytes: 0 %
Lymphocytes Relative: 22 %
Lymphs Abs: 0.9 10*3/uL (ref 0.7–4.0)
MCH: 31.4 pg (ref 26.0–34.0)
MCHC: 32.9 g/dL (ref 30.0–36.0)
MCV: 95.7 fL (ref 80.0–100.0)
Monocytes Absolute: 0.6 10*3/uL (ref 0.1–1.0)
Monocytes Relative: 16 %
Neutro Abs: 2.2 10*3/uL (ref 1.7–7.7)
Neutrophils Relative %: 55 %
Platelet Count: 270 10*3/uL (ref 150–400)
RBC: 4.39 MIL/uL (ref 3.87–5.11)
RDW: 14 % (ref 11.5–15.5)
WBC Count: 4 10*3/uL (ref 4.0–10.5)
nRBC: 0 % (ref 0.0–0.2)

## 2023-12-26 MED ORDER — FAMOTIDINE 20 MG PO TABS
20.0000 mg | ORAL_TABLET | Freq: Once | ORAL | Status: AC
Start: 2023-12-26 — End: 2023-12-26
  Administered 2023-12-26: 20 mg via ORAL
  Filled 2023-12-26: qty 1

## 2023-12-26 MED ORDER — DIPHENHYDRAMINE HCL 25 MG PO CAPS
25.0000 mg | ORAL_CAPSULE | Freq: Once | ORAL | Status: AC
Start: 2023-12-26 — End: 2023-12-26
  Administered 2023-12-26: 25 mg via ORAL
  Filled 2023-12-26: qty 1

## 2023-12-26 MED ORDER — ACETAMINOPHEN 325 MG PO TABS
650.0000 mg | ORAL_TABLET | Freq: Once | ORAL | Status: AC
  Administered 2023-12-26: 650 mg via ORAL
  Filled 2023-12-26: qty 2

## 2023-12-26 MED ORDER — DARATUMUMAB-HYALURONIDASE-FIHJ 1800-30000 MG-UT/15ML ~~LOC~~ SOLN
1800.0000 mg | Freq: Once | SUBCUTANEOUS | Status: AC
  Administered 2023-12-26: 1800 mg via SUBCUTANEOUS
  Filled 2023-12-26: qty 15

## 2023-12-26 NOTE — Patient Instructions (Signed)

## 2023-12-26 NOTE — Addendum Note (Signed)
 Addended by: Joline Maxcy A on: 12/26/2023 12:19 PM   Modules accepted: Orders

## 2023-12-31 LAB — MULTIPLE MYELOMA PANEL, SERUM
Albumin SerPl Elph-Mcnc: 3.7 g/dL (ref 2.9–4.4)
Albumin/Glob SerPl: 1.5 (ref 0.7–1.7)
Alpha 1: 0.2 g/dL (ref 0.0–0.4)
Alpha2 Glob SerPl Elph-Mcnc: 0.8 g/dL (ref 0.4–1.0)
B-Globulin SerPl Elph-Mcnc: 1 g/dL (ref 0.7–1.3)
Gamma Glob SerPl Elph-Mcnc: 0.5 g/dL (ref 0.4–1.8)
Globulin, Total: 2.5 g/dL (ref 2.2–3.9)
IgA: 119 mg/dL (ref 87–352)
IgG (Immunoglobin G), Serum: 627 mg/dL (ref 586–1602)
IgM (Immunoglobulin M), Srm: 27 mg/dL (ref 26–217)
Total Protein ELP: 6.2 g/dL (ref 6.0–8.5)

## 2024-01-04 DIAGNOSIS — R002 Palpitations: Secondary | ICD-10-CM | POA: Diagnosis not present

## 2024-01-08 ENCOUNTER — Other Ambulatory Visit: Payer: Self-pay | Admitting: Hematology

## 2024-01-08 DIAGNOSIS — C9001 Multiple myeloma in remission: Secondary | ICD-10-CM

## 2024-01-17 ENCOUNTER — Inpatient Hospital Stay (HOSPITAL_BASED_OUTPATIENT_CLINIC_OR_DEPARTMENT_OTHER): Admitting: Physician Assistant

## 2024-01-17 ENCOUNTER — Ambulatory Visit (HOSPITAL_COMMUNITY): Attending: Internal Medicine

## 2024-01-17 ENCOUNTER — Other Ambulatory Visit: Payer: Self-pay

## 2024-01-17 VITALS — BP 132/85 | HR 82 | Temp 97.7°F | Resp 16 | Ht 68.0 in | Wt 132.8 lb

## 2024-01-17 DIAGNOSIS — C9001 Multiple myeloma in remission: Secondary | ICD-10-CM

## 2024-01-17 DIAGNOSIS — E7800 Pure hypercholesterolemia, unspecified: Secondary | ICD-10-CM | POA: Insufficient documentation

## 2024-01-17 DIAGNOSIS — Z5112 Encounter for antineoplastic immunotherapy: Secondary | ICD-10-CM | POA: Diagnosis not present

## 2024-01-17 DIAGNOSIS — G4733 Obstructive sleep apnea (adult) (pediatric): Secondary | ICD-10-CM | POA: Insufficient documentation

## 2024-01-17 DIAGNOSIS — R002 Palpitations: Secondary | ICD-10-CM | POA: Diagnosis present

## 2024-01-17 DIAGNOSIS — I359 Nonrheumatic aortic valve disorder, unspecified: Secondary | ICD-10-CM | POA: Insufficient documentation

## 2024-01-17 DIAGNOSIS — M25571 Pain in right ankle and joints of right foot: Secondary | ICD-10-CM | POA: Diagnosis not present

## 2024-01-17 LAB — ECHOCARDIOGRAM COMPLETE
Area-P 1/2: 4.08 cm2
P 1/2 time: 622 ms
S' Lateral: 2 cm

## 2024-01-17 MED ORDER — LENALIDOMIDE 10 MG PO CAPS: ORAL_CAPSULE | ORAL | 0 refills | Status: DC

## 2024-01-17 NOTE — Progress Notes (Signed)
 Symptom Management Consult Note [facility]    Patient Care Team: Rae Bugler, MD as PCP - General (Family Medicine) Audery Blazing, Deannie Fabian, MD as PCP - Cardiology (Cardiology) Devon Fogo, MD (Inactive) as Consulting Physician (Dermatology) Jorie Newness, Blondie Burke, MD as Consulting Physician (Obstetrics and Gynecology) Frankie Israel, MD as Consulting Physician (Hematology)    Name / MRN / DOB: Paula Johnson  [id number]  Sep 12, 1956   Date of visit: 01/17/2024   Chief Complaint/Reason for visit: right ankle swelling   Current Therapy: Darzalex  Faspro  Last treatment:  Day 1   Cycle 19 on 12/26/23    ASSESSMENT AND PLAN Patient is a 68 y.o. female with oncologic history of multiple myeloma followed by Dr. Salomon Cree.  I have viewed most recent oncology note and lab work.  #Multiple myeloma - Next appointment with oncologist is 02/20/24   #Ankle swelling - Intermittent evening ankle swelling, resolving by morning. Likely dependent edema or minor strain from recent increased activity. HPI not suggestive of blood clot. PE today without swelling, no tenderness. - Engaged in shared decision making with patient regarding imaging. With benign exam x-ray is not indicated at this time. She agrees to try symptom management first. - Elevate the ankle when possible. Consider wearing a compression sock to aid circulation. - Encouraged to see PCP/ortho if swelling progresses up the leg or does not improve.   #Peripheral neuropathy - Grade 1 per HPI.  - Patient plans to discuss with oncologist at upcoming appointment.  Strict ED precautions discussed should symptoms worsen.   HEME/ONC HISTORY Oncology History  Multiple myeloma  10/19/2015 Initial Diagnosis   Multiple myeloma   08/11/2022 -  Chemotherapy   Patient is on Treatment Plan : MYELOMA Daratumumab  SQ q28d     11/28/2023 Cancer Staging   Staging form: Plasma Cell Myeloma and Plasma Cell Disorders, AJCC  8th Edition - Clinical: High-risk cytogenetics: Absent, LDH: Normal - Signed by Frankie Israel, MD on 11/28/2023 Stage prefix: Initial diagnosis Cytogenetics: t(11;14) translocation       INTERVAL HISTORY  Discussed the use of AI scribe software for clinical note transcription with the patient, who gave verbal consent to proceed.    Paula Johnson is a 68 y.o. female with oncologic history as above presenting to [facility] today with chief complaint of right ankle pain. Patient presents unaccompanied to visit today.   She has been experiencing swelling in her ankle x 5 days, primarily on her right inner ankle, obscuring the ankle bone. The swelling recurs each evening and subsides by the next morning, occurring later in the day and resolving overnight without intervention. No pain or limitation in range of motion is noted, but there is a sensation of tightness. She has not been using ice or elevation to manage the swelling.  Her recent activities included working in the yard and assisting her husband with planting seeds, without recalling any specific injury. She engaged in [place] dancing for three consecutive nights at [facility], which is more frequent than usual. Her typical daily routine involves a lot of sitting, as she enjoys knitting, which keeps her stationary for extended periods. She does not currently follow an exercise program but wants to return to yoga.  She has a history of neuropathy, described as numbness and tingling, initially starting under her toes and now progressing to the tops and soles of her feet. The sensation is likened to 'feeling fat' as if under the influence of  Novocain. The numbness is more noticeable at night under the sheets and less so when wearing shoes. She is concerned about the progression of her symptoms, especially given her mother's history of similar issues, which have led to foot drop and the need for a brace.    ROS  All other systems are reviewed  and are negative for acute change except as noted in the HPI.    Allergies  Allergen Reactions   Ampicillin Rash and Other (See Comments)   Penicillins Rash and Other (See Comments)    Has patient had a PCN reaction causing immediate rash, facial/tongue/throat swelling, SOB or lightheadedness with hypotension: Y Has patient had a PCN reaction causing severe rash involving mucus membranes or skin necrosis: Y Has patient had a PCN reaction that required hospitalization: N Has patient had a PCN reaction occurring within the last 10 years: N If all of the above answers are "NO", then may proceed with Cephalosporin use.      Past Medical History:  Diagnosis Date   Abnormal Pap smear of vagina 03/22/2017   ASCUS pap and negative HR HPV.  Patient is on immunosuppressive medication.    Allergic reaction to bee sting    Anemia    AR (aortic regurgitation) 11/30/2017   trace noted on ECHO   Atrial septal aneurysm per 11-30-17 echo   trivial pericardial effusion   Complicated migraine    Constipation    DDD (degenerative disc disease), cervical    DDD (degenerative disc disease), lumbosacral    Fatty liver    Female proctocele without uterine prolapse    Fibroid    reason for Hysterectomy   FTT (failure to thrive) in adult 08/09/2017   Gastroesophageal reflux disease without esophagitis 01/17/2017   Generalized anxiety disorder    Grade I diastolic dysfunction    Hammer toe 05/13/2018   Hematuria 11/08/2015   History of COVID-19 12/2020   History of stress incontinence    Hypergammaglobulinemia    Hyperlipidemia    Hypoxemia 12/16/2007   Insomnia    Leg length discrepancy    Loss of appetite    Lung nodule    Malignant neoplasm of thymus (HCC) 12/03/2007   Metastatic multiple myeloma to bone (HCC) 10/19/2015   Metatarsalgia of right foot 07/15/2018   MR (mitral regurgitation) 11/30/2017   trace noted on ECHO   Muscular fasciculation    Nasal septal deviation    Nasal  turbinate hypertrophy    Near syncope    Obstructive sleep apnea 12/03/2007   Other fatigue    Peripheral edema    Plasma cell dyscrasia    PONV (postoperative nausea and vomiting)    thinks morphine  caused nausea   Postoperative urinary retention 05/14/2018   Prediabetes    Pure hypercholesterolemia    Rash 11/01/2015   Restless leg syndrome    S/P autologous bone marrow transplantation (HCC) 03/29/2016   Shortness of breath 12/04/2007   TR (tricuspid regurgitation) 11/30/2017   trace noted on ECHO   Urinary incontinence    UTI (urinary tract infection)    Vaginal granulation tissue 12/03/2017   Vitamin D  deficiency    Wears glasses      Past Surgical History:  Procedure Laterality Date   ABDOMINAL HYSTERECTOMY  1997   TAH--ovaries remain--Dr. Dolph Friar   ANTERIOR AND POSTERIOR REPAIR N/A 05/14/2018   Procedure: ANTERIOR (CYSTOCELE) AND POSTERIOR REPAIR (RECTOCELE);  Surgeon: Greta Leatherwood, MD;  Location: [facility];  Service: Gynecology;  Laterality: N/A;   BLADDER SUSPENSION N/A 05/14/2018   Procedure: TRANSVAGINAL TAPE (TVT) PROCEDURE exact midurethral sling;  Surgeon: Greta Leatherwood, MD;  Location: [facility];  Service: Gynecology;  Laterality: N/A;   BONE MARROW TRANSPLANT  2017   done at [facility]   BREAST BIOPSY     COLONOSCOPY     CYSTOSCOPY N/A 05/14/2018   Procedure: CYSTOSCOPY;  Surgeon: Greta Leatherwood, MD;  Location: [facility];  Service: Gynecology;  Laterality: N/A;   CYSTOSCOPY N/A 05/30/2018   Procedure: CYSTOSCOPY;  Surgeon: Greta Leatherwood, MD;  [facility];  Service: Gynecology;  Laterality: N/A;   ROBOTIC ASSISTED LAPAROSCOPIC SACROCOLPOPEXY N/A 05/14/2018   Procedure: XI ROBOTIC ASSISTED LAPAROSCOPIC SACROCOLPOPEXY;  Surgeon: Greta Leatherwood, MD;  Location: [facility];  Service: Gynecology;  Laterality: N/A;  6 hours OR time. Need extended stay recovery bed.   SALPINGOOPHORECTOMY Bilateral  05/14/2018   Procedure: SALPINGO OOPHORECTOMY;  Surgeon: Greta Leatherwood, MD;  Location: [facility];  Service: Gynecology;  Laterality: Bilateral;   THYMECTOMY  1999   TRANSVAGINAL TAPE (TVT) REMOVAL N/A 05/30/2018   Procedure: TRANSVAGINAL TAPE (TVT)  Revision;  Surgeon: Greta Leatherwood, MD;  Location: [facility];  Service: Gynecology;  Laterality: N/A;   WISDOM TOOTH EXTRACTION      Social History   Socioeconomic History   Marital status: Married    Spouse name: Ethelle Herb   Number of children: 2   Years of education: 16   Highest education level: Bachelor's degree (e.g., BA, AB, BS)  Occupational History   Occupation: Retired    Comment: Engineer, 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  Tobacco Use   Smoking status: Never   Smokeless tobacco: Never  Vaping Use   Vaping status: Never Used  Substance and Sexual Activity   Alcohol use: Not Currently    Alcohol/week: 0.0 standard drinks of alcohol   Drug use: Never   Sexual activity: Not Currently    Partners: Male    Birth control/protection: Surgical    Comment: TAH--ovaries remain  Other Topics Concern   Not on file  Social History Narrative   Lives with husband   Caffeine- coffee, 2 cups daily   Right handed   Social Drivers of Corporate investment banker Strain: Not on file  Food Insecurity: Not on file  Transportation Needs: Not on file  Physical Activity: Not on file  Stress: Not on file  Social Connections: Not on file  Intimate Partner Violence: Not on file    Family History  Problem Relation Age of Onset   Hyperlipidemia Mother    Memory loss Mother        90s; believed to be age-related memory decline   COPD Father        dec age 80/s-smoked   Macular degeneration Father    Behavior problems Father        at the end of life   Diabetes Brother    Macular degeneration Brother    Suicidality Brother    Diabetes Maternal Grandmother    Heart attack Paternal Grandfather      Current  Outpatient Medications:    acyclovir  (ZOVIRAX ) 400 MG tablet, Take 1 tablet by mouth twice daily, Disp: 60 tablet, Rfl: 0   Ascorbic Acid (VITAMIN C) 1000 MG tablet, Take 1,000 mg by mouth 2 (two) times daily., Disp: , Rfl:    aspirin  EC 81 MG  tablet, Take 81 mg by mouth daily., Disp: , Rfl:    b complex vitamins capsule, Take 1 capsule by mouth daily., Disp: , Rfl:    Cholecalciferol  (VITAMIN D3) 30 MCG/15ML LIQD, Take by mouth. Pt. Takes 5000 IU daily, Disp: , Rfl:    Cyanocobalamin (B-12) 1000 MCG CAPS, , Disp: , Rfl:    estradiol  (ESTRACE ) 0.1 MG/GM vaginal cream, INSERT  1 GRAM VAGINALLY TWICE A WEEK AT BEDTIME, Disp: 43 g, Rfl: 1   Iron, Ferrous Sulfate, 325 (65 Fe) MG TABS, Take 325 mg by mouth daily., Disp: , Rfl:    lenalidomide  (REVLIMID ) 10 MG capsule, TAKE ONE CAPSULE BY MOUTH DAILY FOR 21 DAYS, THEN 7 DAYS OFF, Disp: 21 capsule, Rfl: 0   Magnesium  400 MG CAPS, Take by mouth., Disp: , Rfl:    Multiple Vitamins-Minerals (PRESERVISION AREDS PO), Take 1 capsule by mouth 2 (two) times daily., Disp: , Rfl:    EPINEPHrine  0.3 mg/0.3 mL IJ SOAJ injection, INJECT INTRAMUSCULARLLY AS DIRECTED AS NEEDED FOR ANAPHYLAXIS, Disp: , Rfl:    LORazepam  (ATIVAN ) 0.5 MG tablet, Take 1 tablet (0.5 mg total) by mouth at bedtime as needed for sleep or anxiety. (Patient not taking: Reported on 01/17/2024), Disp: 30 tablet, Rfl: 0   tiZANidine (ZANAFLEX) 4 MG tablet, Take by mouth. (Patient not taking: Reported on 01/17/2024), Disp: , Rfl:   PHYSICAL EXAM ECOG FS:0 - Asymptomatic    Vitals:   01/17/24 1000  BP: 132/85  Pulse: 82  Resp: 16  Temp: 97.7 F (36.5 C)  TempSrc: Temporal  SpO2: 100%  Weight: 132 lb 12.8 oz (60.2 kg)  Height: 5\' 8"  (1.727 m)   Physical Exam Vitals and nursing note reviewed.  Constitutional:      Appearance: She is not ill-appearing or toxic-appearing.  HENT:     Head: Normocephalic.  Eyes:     Conjunctiva/sclera: Conjunctivae normal.  Cardiovascular:     Rate and  Rhythm: Normal rate.     Pulses:          Dorsalis pedis pulses are 2+ on the right side.  Pulmonary:     Effort: Pulmonary effort is normal.     Breath sounds: Normal breath sounds.  Abdominal:     General: There is no distension.  Musculoskeletal:     Cervical back: Normal range of motion.     Right lower leg: No edema.     Left lower leg: No edema.     Comments: There is no swelling or  tenderness over the lateral malleolus.No overt deformity. No tenderness over the medial aspect of the ankle. The fifth metatarsal is not tender. The ankle joint is intact without excessive opening on stressing.   Ambulatory with normal gait.  Skin:    General: Skin is warm and dry.  Neurological:     Mental Status: She is alert.     Comments: Sensation intact to RLE        LABORATORY DATA I have reviewed the data as listed    Latest Ref Rng & Units 12/26/2023   12:25 PM 11/28/2023   10:39 AM 10/31/2023    2:25 PM  CBC  WBC 4.0 - 10.5 K/uL 4.0  3.3  3.9   Hemoglobin 12.0 - 15.0 g/dL 16.1  09.6  04.5   Hematocrit 36.0 - 46.0 % 42.0  41.9  41.2   Platelets 150 - 400 K/uL 270  234  239         Latest Ref  Rng & Units 12/26/2023   12:25 PM 11/28/2023   10:48 AM 10/31/2023    2:25 PM  CMP  Glucose 70 - 99 mg/dL 86  98  599   BUN 8 - 23 mg/dL 16  15  23    Creatinine 0.44 - 1.00 mg/dL 3.57  0.17  7.93   Sodium 135 - 145 mmol/L 140  138  137   Potassium 3.5 - 5.1 mmol/L 3.8  4.0  4.0   Chloride 98 - 111 mmol/L 104  104  105   CO2 22 - 32 mmol/L 30  29  22    Calcium  8.9 - 10.3 mg/dL 9.4  9.1  8.7   Total Protein 6.5 - 8.1 g/dL 6.9  6.9  6.2   Total Bilirubin 0.0 - 1.2 mg/dL 1.0  1.1  1.2   Alkaline Phos 38 - 126 U/L 71  71  60   AST 15 - 41 U/L 15  19  23    ALT 0 - 44 U/L 21  24  29         RADIOGRAPHIC STUDIES (from last 24 hours if applicable) I have personally reviewed the radiological images as listed and agreed with the findings in the report. No results found.      Visit  Diagnosis: 1. Multiple myeloma in remission (HCC)   2. Acute right ankle pain      No orders of the defined types were placed in this encounter.   All questions were answered. The patient knows to call the clinic with any problems, questions or concerns. No barriers to learning was detected.  A total of more than 30 minutes were spent on this encounter with face-to-face time and non-face-to-face time, including preparing to see the patient, ordering tests and/or medications, counseling the patient and coordination of care as outlined above.    Thank you for allowing me to participate in the care of this patient.    Elidia Bonenfant E  Walisiewicz, PA-C Department of Hematology/Oncology [facility] Phone: [phone]  [phone]    01/17/2024 11:31 AM

## 2024-01-21 ENCOUNTER — Encounter: Payer: Self-pay | Admitting: *Deleted

## 2024-01-23 ENCOUNTER — Inpatient Hospital Stay

## 2024-01-23 ENCOUNTER — Other Ambulatory Visit

## 2024-01-23 ENCOUNTER — Ambulatory Visit: Admitting: Hematology

## 2024-01-23 VITALS — BP 120/69 | HR 70 | Temp 98.0°F | Resp 17

## 2024-01-23 DIAGNOSIS — C9001 Multiple myeloma in remission: Secondary | ICD-10-CM

## 2024-01-23 DIAGNOSIS — Z5112 Encounter for antineoplastic immunotherapy: Secondary | ICD-10-CM | POA: Diagnosis not present

## 2024-01-23 LAB — CMP (CANCER CENTER ONLY)
ALT: 20 U/L (ref 0–44)
AST: 16 U/L (ref 15–41)
Albumin: 4.6 g/dL (ref 3.5–5.0)
Alkaline Phosphatase: 69 U/L (ref 38–126)
Anion gap: 5 (ref 5–15)
BUN: 15 mg/dL (ref 8–23)
CO2: 30 mmol/L (ref 22–32)
Calcium: 9.6 mg/dL (ref 8.9–10.3)
Chloride: 105 mmol/L (ref 98–111)
Creatinine: 0.85 mg/dL (ref 0.44–1.00)
GFR, Estimated: >60 mL/min (ref >60)
Glucose, Bld: 93 mg/dL (ref 70–99)
Potassium: 4.3 mmol/L (ref 3.5–5.1)
Sodium: 140 mmol/L (ref 135–145)
Total Bilirubin: 1.3 mg/dL — ABNORMAL HIGH (ref 0.0–1.2)
Total Protein: 7 g/dL (ref 6.5–8.1)

## 2024-01-23 LAB — CBC WITH DIFFERENTIAL (CANCER CENTER ONLY)
Abs Immature Granulocytes: 0 10*3/uL (ref 0.00–0.07)
Basophils Absolute: 0 10*3/uL (ref 0.0–0.1)
Basophils Relative: 1 %
Eosinophils Absolute: 0.1 10*3/uL (ref 0.0–0.5)
Eosinophils Relative: 4 %
HCT: 41.8 % (ref 36.0–46.0)
Hemoglobin: 14.3 g/dL (ref 12.0–15.0)
Immature Granulocytes: 0 %
Lymphocytes Relative: 23 %
Lymphs Abs: 0.7 10*3/uL (ref 0.7–4.0)
MCH: 31.6 pg (ref 26.0–34.0)
MCHC: 34.2 g/dL (ref 30.0–36.0)
MCV: 92.5 fL (ref 80.0–100.0)
Monocytes Absolute: 0.5 10*3/uL (ref 0.1–1.0)
Monocytes Relative: 16 %
Neutro Abs: 1.7 10*3/uL (ref 1.7–7.7)
Neutrophils Relative %: 56 %
Platelet Count: 247 10*3/uL (ref 150–400)
RBC: 4.52 MIL/uL (ref 3.87–5.11)
RDW: 14.4 % (ref 11.5–15.5)
WBC Count: 3 10*3/uL — ABNORMAL LOW (ref 4.0–10.5)
nRBC: 0 % (ref 0.0–0.2)

## 2024-01-23 MED ORDER — FAMOTIDINE 20 MG PO TABS
20.0000 mg | ORAL_TABLET | Freq: Once | ORAL | Status: AC
  Administered 2024-01-23: 20 mg via ORAL
  Filled 2024-01-23: qty 1

## 2024-01-23 MED ORDER — DARATUMUMAB-HYALURONIDASE-FIHJ 1800-30000 MG-UT/15ML ~~LOC~~ SOLN
1800.0000 mg | Freq: Once | SUBCUTANEOUS | Status: AC
  Administered 2024-01-23: 1800 mg via SUBCUTANEOUS
  Filled 2024-01-23: qty 15

## 2024-01-23 MED ORDER — ACETAMINOPHEN 325 MG PO TABS
650.0000 mg | ORAL_TABLET | Freq: Once | ORAL | Status: AC
  Administered 2024-01-23: 650 mg via ORAL
  Filled 2024-01-23: qty 2

## 2024-01-23 MED ORDER — DIPHENHYDRAMINE HCL 25 MG PO CAPS
25.0000 mg | ORAL_CAPSULE | Freq: Once | ORAL | Status: AC
  Administered 2024-01-23: 25 mg via ORAL
  Filled 2024-01-23: qty 1

## 2024-01-23 NOTE — Patient Instructions (Signed)

## 2024-01-27 LAB — MULTIPLE MYELOMA PANEL, SERUM
Albumin SerPl Elph-Mcnc: 4.1 g/dL (ref 2.9–4.4)
Albumin/Glob SerPl: 1.8 — ABNORMAL HIGH (ref 0.7–1.7)
Alpha 1: 0.2 g/dL (ref 0.0–0.4)
Alpha2 Glob SerPl Elph-Mcnc: 0.7 g/dL (ref 0.4–1.0)
B-Globulin SerPl Elph-Mcnc: 0.9 g/dL (ref 0.7–1.3)
Gamma Glob SerPl Elph-Mcnc: 0.5 g/dL (ref 0.4–1.8)
Globulin, Total: 2.3 g/dL (ref 2.2–3.9)
IgA: 125 mg/dL (ref 87–352)
IgG (Immunoglobin G), Serum: 628 mg/dL (ref 586–1602)
IgM (Immunoglobulin M), Srm: 28 mg/dL (ref 26–217)
Total Protein ELP: 6.4 g/dL (ref 6.0–8.5)

## 2024-01-30 ENCOUNTER — Encounter: Payer: Self-pay | Admitting: Obstetrics and Gynecology

## 2024-01-31 ENCOUNTER — Encounter: Payer: Self-pay | Admitting: Obstetrics and Gynecology

## 2024-02-04 ENCOUNTER — Encounter: Payer: Self-pay | Admitting: Obstetrics and Gynecology

## 2024-02-05 ENCOUNTER — Ambulatory Visit: Payer: Self-pay | Admitting: *Deleted

## 2024-02-07 ENCOUNTER — Other Ambulatory Visit: Payer: Self-pay | Admitting: Hematology

## 2024-02-07 DIAGNOSIS — C9001 Multiple myeloma in remission: Secondary | ICD-10-CM

## 2024-02-11 ENCOUNTER — Encounter: Payer: Self-pay | Admitting: Hematology

## 2024-02-15 ENCOUNTER — Other Ambulatory Visit: Payer: Self-pay

## 2024-02-15 DIAGNOSIS — C9001 Multiple myeloma in remission: Secondary | ICD-10-CM

## 2024-02-15 MED ORDER — LENALIDOMIDE 10 MG PO CAPS: ORAL_CAPSULE | ORAL | 0 refills | Status: DC

## 2024-02-19 ENCOUNTER — Other Ambulatory Visit: Payer: Self-pay

## 2024-02-19 DIAGNOSIS — C9001 Multiple myeloma in remission: Secondary | ICD-10-CM

## 2024-02-19 NOTE — Progress Notes (Signed)
 HEMATOLOGY/ONCOLOGY CLINIC NOTE  Date of Service: 02/20/2024  PCP: Rae Bugler, MD Transplant Oncologist -- Dr Elayne Greaser MD  CHIEF COMPLAINT: Follow-up for continued evaluation and management of multiple myeloma  DIAGNOSIS: IgA lambda R-ISS stage II multiple myeloma with innumerable lytic lesions in the calvarium, lesion in T7 vertebra and multiple lesions in the pelvis.  Diagnosed in January 2017.   Current treatment: Revlimid  10mg  po daily 3 weeks on 1 week off (after treatment interruption) Daratumumab  added 11/15 for loss of MRD negative status   Previous Treatment: Status post Vd x 1 cycle VRd x 6 cycles   HD Melphalan 200mg /m2 on 03/22/2016 Autologous HSCT on 03/23/2016 with Dr Marjorie Sieving at [facility] (Dose of 6.4 x10^6/kg)   INTERVAL HISTORY:   Paula Johnson is a 68 y.o. female is here for continued evaluation and management of multiple myeloma and Daratumumab  cycle 21 day 1.   Patient was last seen by me on 11/28/2023 and was doing well overall with no new medical complaints.   She was seen by [facility] PA on 01/17/2024 and complained of ankle swelling primarily in right ankle with pain with tightness sensation. She also reported neuropathy with numbness and tingling in her feet.   She complains of persistent neuropathy in her feet which she feels is progressing. Patient reports that the soles of her feet  have more tingling and she intermittently has tingling on the top of the feet.  She notes that she also previously had numbness in the balls of the feet prior to her myeloma diagnosis, and she attributed the numbness to dancing. Her numbness then progressed to her toes.   She ocasionallly has tingling in the right anterior ankle area.   Her tingling/numbness is only present in the lower extremities. Patient denies any back pain, fall, or injuries.   Her tingling numbness is present more often during the night and is not bothersome during the day.   She  regularly receives Acupuncture every 2 weeks.   She reports that her right arm is occasionally numb, which she attributes to knitting/crocheting. Patient denies any concern for carpal tunnel.  She complains of diarrhea primarily during the day with some element of urgency and is somewhat explosive. Patient occasionally has diarrhea in the middle of the night.   She denies any patterns in regards to her bowel habits with treatment.   Patient notes that she now has imodium available to use if needed.   She will be seeing Dr. Bambi Lever on December 4th.  Patient is taking vitamin B complex.  She reports that she generally does not stay very well-hydrated. Patient has generally been eating well.   She denies any infection issues, fever, chills, night sweats, back pain, or new bone pain.   Patient has a scheduled bone marrow biopsy on November 11th.   She plans to travel to the beach in early June.   REVIEW OF SYSTEMS:    10 Point review of Systems was done is negative except as noted above.   PHYSICAL EXAMINATION: .LMP 09/26/1995 (Within Months)  .BP 110/67   Pulse 85   Temp 97.7 F (36.5 C)   Resp 20   Wt 133 lb 4.8 oz (60.5 kg)   LMP 09/26/1995 (Within Months)   SpO2 99%   BMI 20.27 kg/m   GENERAL:alert, in no acute distress and comfortable SKIN: no acute rashes, no significant lesions EYES: conjunctiva are pink and non-injected, sclera anicteric OROPHARYNX: MMM, no exudates, no oropharyngeal erythema or ulceration NECK:  supple, no JVD LYMPH:  no palpable lymphadenopathy in the cervical, axillary or inguinal regions LUNGS: clear to auscultation b/l with normal respiratory effort HEART: regular rate & rhythm ABDOMEN:  normoactive bowel sounds , non tender, not distended. Extremity: no pedal edema PSYCH: alert & oriented x 3 with fluent speech NEURO: no focal motor/sensory deficits   MEDICAL HISTORY:   Past Medical History:  Diagnosis Date   Abnormal Pap smear of  vagina 03/22/2017   ASCUS pap and negative HR HPV.  Patient is on immunosuppressive medication.    Allergic reaction to bee sting    Anemia    AR (aortic regurgitation) 11/30/2017   trace noted on ECHO   Atrial septal aneurysm per 11-30-17 echo   trivial pericardial effusion   Complicated migraine    Constipation    DDD (degenerative disc disease), cervical    DDD (degenerative disc disease), lumbosacral    Fatty liver    Female proctocele without uterine prolapse    Fibroid    reason for Hysterectomy   FTT (failure to thrive) in adult 08/09/2017   Gastroesophageal reflux disease without esophagitis 01/17/2017   Generalized anxiety disorder    Grade I diastolic dysfunction    Hammer toe 05/13/2018   Hematuria 11/08/2015   History of COVID-19 12/2020   History of stress incontinence    Hypergammaglobulinemia    Hyperlipidemia    Hypoxemia 12/16/2007   Insomnia    Leg length discrepancy    Loss of appetite    Lung nodule    Malignant neoplasm of thymus (HCC) 12/03/2007   Metastatic multiple myeloma to bone (HCC) 10/19/2015   Metatarsalgia of right foot 07/15/2018   MR (mitral regurgitation) 11/30/2017   trace noted on ECHO   Muscular fasciculation    Nasal septal deviation    Nasal turbinate hypertrophy    Near syncope    Obstructive sleep apnea 12/03/2007   Other fatigue    Peripheral edema    Plasma cell dyscrasia    PONV (postoperative nausea and vomiting)    thinks morphine  caused nausea   Postoperative urinary retention 05/14/2018   Prediabetes    Pure hypercholesterolemia    Rash 11/01/2015   Restless leg syndrome    S/P autologous bone marrow transplantation (HCC) 03/29/2016   Shortness of breath 12/04/2007   TR (tricuspid regurgitation) 11/30/2017   trace noted on ECHO   Urinary incontinence    UTI (urinary tract infection)    Vaginal granulation tissue 12/03/2017   Vitamin D  deficiency    Wears glasses     SURGICAL HISTORY: Past Surgical History:   Procedure Laterality Date   ABDOMINAL HYSTERECTOMY  1997   TAH--ovaries remain--Dr. Dolph Friar   ANTERIOR AND POSTERIOR REPAIR N/A 05/14/2018   Procedure: ANTERIOR (CYSTOCELE) AND POSTERIOR REPAIR (RECTOCELE);  Surgeon: Greta Leatherwood, MD;  Location: [facility];  Service: Gynecology;  Laterality: N/A;   BLADDER SUSPENSION N/A 05/14/2018   Procedure: TRANSVAGINAL TAPE (TVT) PROCEDURE exact midurethral sling;  Surgeon: Greta Leatherwood, MD;  Location: [facility];  Service: Gynecology;  Laterality: N/A;   BONE MARROW TRANSPLANT  2017   done at [facility]   BREAST BIOPSY     COLONOSCOPY     CYSTOSCOPY N/A 05/14/2018   Procedure: CYSTOSCOPY;  Surgeon: Greta Leatherwood, MD;  Location: [facility];  Service: Gynecology;  Laterality: N/A;   CYSTOSCOPY N/A 05/30/2018   Procedure: CYSTOSCOPY;  Surgeon: Greta Leatherwood, MD;  Location:  [facility];  Service: Gynecology;  Laterality: N/A;   ROBOTIC ASSISTED LAPAROSCOPIC SACROCOLPOPEXY N/A 05/14/2018   Procedure: XI ROBOTIC ASSISTED LAPAROSCOPIC SACROCOLPOPEXY;  Surgeon: Greta Leatherwood, MD;  Location: [facility];  Service: Gynecology;  Laterality: N/A;  6 hours OR time. Need extended stay recovery bed.   SALPINGOOPHORECTOMY Bilateral 05/14/2018   Procedure: SALPINGO OOPHORECTOMY;  Surgeon: Greta Leatherwood, MD;  Location: [facility];  Service: Gynecology;  Laterality: Bilateral;   THYMECTOMY  1999   TRANSVAGINAL TAPE (TVT) REMOVAL N/A 05/30/2018   Procedure: TRANSVAGINAL TAPE (TVT)  Revision;  Surgeon: Greta Leatherwood, MD;  Location: [facility];  Service: Gynecology;  Laterality: N/A;   WISDOM TOOTH EXTRACTION      SOCIAL HISTORY: Social History   Socioeconomic History   Marital status: Married    Spouse name: Ethelle Herb   Number of children: 2   Years of education: 16   Highest education level: Bachelor's degree (e.g., BA, AB, BS)  Occupational History   Occupation: Retired     Comment: Engineer, 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  Tobacco Use   Smoking status: Never   Smokeless tobacco: Never  Vaping Use   Vaping status: Never Used  Substance and Sexual Activity   Alcohol use: Not Currently    Alcohol/week: 0.0 standard drinks of alcohol   Drug use: Never   Sexual activity: Not Currently    Partners: Male    Birth control/protection: Surgical    Comment: TAH--ovaries remain  Other Topics Concern   Not on file  Social History Narrative   Lives with husband   Caffeine- coffee, 2 cups daily   Right handed   Social Drivers of Corporate investment banker Strain: Not on file  Food Insecurity: Not on file  Transportation Needs: Not on file  Physical Activity: Not on file  Stress: Not on file  Social Connections: Not on file  Intimate Partner Violence: Not on file    FAMILY HISTORY: Family History  Problem Relation Age of Onset   Hyperlipidemia Mother    Memory loss Mother        90s; believed to be age-related memory decline   COPD Father        dec age 67/s-smoked   Macular degeneration Father    Behavior problems Father        at the end of life   Diabetes Brother    Macular degeneration Brother    Suicidality Brother    Diabetes Maternal Grandmother    Heart attack Paternal Grandfather     ALLERGIES:  is allergic to ampicillin and penicillins.  MEDICATIONS:  . Current Outpatient Medications:    acyclovir  (ZOVIRAX ) 400 MG tablet, Take 1 tablet by mouth twice daily, Disp: 60 tablet, Rfl: 0   Ascorbic Acid (VITAMIN C) 1000 MG tablet, Take 1,000 mg by mouth 2 (two) times daily., Disp: , Rfl:    aspirin  EC 81 MG tablet, Take 81 mg by mouth daily., Disp: , Rfl:    b complex vitamins capsule, Take 1 capsule by mouth daily., Disp: , Rfl:    Cholecalciferol  (VITAMIN D3) 30 MCG/15ML LIQD, Take by mouth. Pt. Takes 5000 IU daily, Disp: , Rfl:    Cyanocobalamin (B-12) 1000 MCG CAPS, , Disp: , Rfl:    EPINEPHrine  0.3 mg/0.3 mL IJ SOAJ injection, INJECT  INTRAMUSCULARLLY AS DIRECTED AS NEEDED FOR ANAPHYLAXIS, Disp: , Rfl:    estradiol  (ESTRACE ) 0.1 MG/GM vaginal cream, INSERT  1 GRAM VAGINALLY TWICE A WEEK AT BEDTIME, Disp: 43 g, Rfl: 1   Iron, Ferrous Sulfate, 325 (65 Fe) MG TABS, Take 325 mg by mouth daily., Disp: , Rfl:    lenalidomide  (REVLIMID ) 10 MG capsule, TAKE ONE CAPSULE BY MOUTH DAILY FOR 21 DAYS, THEN 7 DAYS OFF, Disp: 21 capsule, Rfl: 0   LORazepam  (ATIVAN ) 0.5 MG tablet, Take 1 tablet (0.5 mg total) by mouth at bedtime as needed for sleep or anxiety. (Patient not taking: Reported on 01/17/2024), Disp: 30 tablet, Rfl: 0   Magnesium  400 MG CAPS, Take by mouth., Disp: , Rfl:    Multiple Vitamins-Minerals (PRESERVISION AREDS PO), Take 1 capsule by mouth 2 (two) times daily., Disp: , Rfl:    tiZANidine (ZANAFLEX) 4 MG tablet, Take by mouth. (Patient not taking: Reported on 01/17/2024), Disp: , Rfl:    LABORATORY DATA:  I have reviewed the data as listed     Latest Ref Rng & Units 02/20/2024   11:59 AM 01/23/2024   10:45 AM 12/26/2023   12:25 PM  CBC  WBC 4.0 - 10.5 K/uL 3.1  3.0  4.0   Hemoglobin 12.0 - 15.0 g/dL 16.1  09.6  04.5   Hematocrit 36.0 - 46.0 % 43.2  41.8  42.0   Platelets 150 - 400 K/uL 236  247  270       Latest Ref Rng & Units 02/20/2024   11:59 AM 01/23/2024   10:43 AM 12/26/2023   12:25 PM  CMP  Glucose 70 - 99 mg/dL 93  93  86   BUN 8 - 23 mg/dL 15  15  16    Creatinine 0.44 - 1.00 mg/dL 4.09  8.11  9.14   Sodium 135 - 145 mmol/L 141  140  140   Potassium 3.5 - 5.1 mmol/L 4.1  4.3  3.8   Chloride 98 - 111 mmol/L 106  105  104   CO2 22 - 32 mmol/L 29  30  30    Calcium  8.9 - 10.3 mg/dL 9.4  9.6  9.4   Total Protein 6.5 - 8.1 g/dL 7.0  7.0  6.9   Total Bilirubin 0.0 - 1.2 mg/dL 1.1  1.3  1.0   Alkaline Phos 38 - 126 U/L 73  69  71   AST 15 - 41 U/L 17  16  15    ALT 0 - 44 U/L 26  20  21       01/10/2023 Bone Marrow Biopsy:    10/10/2019 Flow Cytometry (Repeat BM Bx from [facility]):   10/07/18 Repeat BM Bx  from [facility]:   Surgical Pathology                                Case: [id number]                               Authorizing Provider:  Wilton Hasting, NP    Collected:           07/04/2022 1015             Ordering Location:     [facility] and   Received:            07/04/2022 1048  [facility]                                                   Pathologist:           April Bayard, MD                                                   Specimens:   A) - Bone Marrow, Aspirate                                                                        B) - Bone Marrow, Biopsy                                                                          C) - Bone Marrow, Clot                                                                 DIAGNOSIS   A-C. Peripheral blood and bone marrow (peripheral smear, aspirate, touch preparation, core biopsy, and clot section): - Low-level plasma cell neoplasm, see comment.   COMMENT: The core biopsy is 40% cellular. Cyclin-D1 immunohistochemical stain demonstrates very rare positive plasma cells (1%). Concurrent flow cytometry study ([id number]) shows a plasma cell population with an atypical immunophenotype, comprising approximately 0.04% of analyzed events.    Reviewed by resident/fellow Jackee Marus  Electronically signed by April Bayard, MD on 07/10/2022 at 12:25 PM  Clinical Information   68 year old female with history of plasma cell myeloma.  Gross Examination   A. Peripheral blood and bone marrow labeled with patient's name and medical record number, one peripheral blood smear, three bone marrow aspirate smears and one bone marrow touch preparation are received.   B. "Bone marrow biopsy", in AZF.  Core biopsies of red-brown tissue 1.4 x 0.2 cm in aggregate are submitted in total as block B1 for decalcification.   C. "Bone marrow clot", in AZF.  A 1.8 x 1.8 x 0.3 cm aggregate of  red-brown clot is submitted in total as block C1.    J.Elam/ Dr. Odella Bending   Microscopic Examination        Lab Results  Component Value Date    WBC 5.2 07/04/2022    HGB 12.1 07/04/2022    PLT 364 07/04/2022    MCV 95 07/04/2022    RDWCV 14.6 (H) 07/04/2022    Peripheral blood and automated blood count:  RBCs: The red blood cells are adequate in number,  normocytic and normochromic, with minimal anisopoikilocytosis. WBCs: The white blood cells are adequate in number and show unremarkable morphology. Platelets: The platelets are adequate in number and show unremarkable morphology.   Bone marrow aspirate:  There are adequate cellular particles. M:E ratio is 3:1.  Granulopoiesis: Maturing myeloid elements are present and show complete maturation. Erythropoiesis:  Erythroid elements are present and show complete maturation. Megakaryocytes: Megakaryocytes are adequate in number and show unremarkable morphology.  There are slightly increased plasma cells with unremarkable morphology.   Bone marrow touch preparation: Adequate marrow elements are present, without additional findings to the aspirate smears.   Prussian blue stained aspirate smear: 0 of 4 stainable iron is present. No ringed sideroblasts are identified.   Bone marrow core biopsy:  The core biopsy is adequate for evaluation. The cellularity is 40%. Maturing myeloid elements are present and show complete maturation. Maturing erythroid elements are present and show complete maturation.  Megakaryocytes are adequate in number and show unremarkable morphology. There are slightly increased plasma cells.   A reticulin stain of the core biopsy shows no significant reticulin fibrosis.   Aspirate clot section: There are adequate particles present, without additional findings to the core biopsy.   Immunohistochemical stains for Cyclin-D1, CD138, Kappa CISH, and Lambda CISH are performed on the core biopsy section (block B1). CD138,  kappa, and lambda in-situ hybridization demonstrates 5-10% predominantly polytypic plasma cells. Cyclin-D1 demonstrates very rare positive plasma cells (1%).  Bone Marrow Differential   500 CELL DIFF   Cell Type Cell Count Reference Range Cell Type Cell Count Reference Range Cell Type Cell Count Reference Range  Segs 23 (7-25) Lymphocytes 15 (3-20) Plasma Cells 8 (0-3.5)  Bands 3 (6-36)     Atypicals 0   RBC Precursors 22 (10-30)  Metas 2 (9-25) Lymphoblasts *   Pronormoblasts 0 (0-3)  Myelos 5 (8-15) Eosinophils 12 (0-4) M:E Ratio 3/1    Promyelos 1 (1-6) Basophils 0 (0-1) Iron 0 (Scale 0-4+)  Myeloblasts 1 (0-3.5) Monocytes 8 (0-2)                                                                                                     Performed by:    Levada Raymond                                                                                                         July 04, 2022 12:21 PM  Additional Documentation   All immunohistochemistry, in situ hybridization tests and special stains performed at [facility] and reported herein were developed, validated and their performance characteristics determined by  the [facility]. During the performance of these tests, appropriate positive and negative control slides are also performed and reviewed. All control slides and internal controls (when applicable) demonstrate the expected immunoreactive patterns and/or nucleic acid hybridization. These ancillary studies were deemed medically necessary by the requesting pathologist. They were ordered following review of the H&E and clinical history except when part of a liver/kidney protocol or where clinical history (e.g. immunocompromised, critically ill, history of malignancy) clearly indicates. Some of the tests may not be cleared or approved by the U.S. Food and Drug Administration (FDA).  The FDA has determined that such clearance or approval is not necessary.  These  tests are used for clinical purposes and should not be regarded as investigational or as research.  This laboratory is certified under the Clinical Laboratory Improvement Amendments of 1988 (CLIA) as qualified to perform high complexity clinical testing. At least one block in this case underwent decalcification during its processing. The vast majority of immunohistochemical and hybridization assays were not validated on decalcified tissues. The results were interpreted with caution given the possibility of false negative results on decalcified specimens.  Attestation   All of the diagnostic evaluations on the enumerated specimens have been personally conducted by the pathologists involved in the care of this patient as indicated by the electronic signatures above.  Resulting Agency [facility] SURGICAL PATHOLOGY AND CYTOPATHOLOGY   Specimen Collected: 07/04/22 10:15   Performed by: [facility] SURGICAL PATHOLOGY AND CYTOPATHOLOGY Last Resulted: 07/10/22 12:25  Received From: [facility]  Result Received: 07/14/22 09:58   View Encounter      Received Information Pathology - Bone Marrow (Order [id number])    suggestion  Information displayed in this report may not trend or trigger automated decision support.    Pathology - Bone Marrow Order: [id number] Component 4 wk ago  Case Report Surgical Pathology                                Case: [id number]                               Authorizing Provider:  Wilton Hasting, NP    Collected:           07/04/2022 1015             Ordering Location:     [facility]   Received:            07/04/2022 1048                                    [facility]                                                   Pathologist:           April Bayard, MD  Specimens:   A) - Bone Marrow, Aspirate                                                                        B) - Bone Marrow,  Biopsy                                                                          C) - Bone Marrow, Clot                                                                DIAGNOSIS   A-C. Peripheral blood and bone marrow (peripheral smear, aspirate, touch preparation, core biopsy, and clot section): - Low-level plasma cell neoplasm, see comment.   COMMENT: The core biopsy is 40% cellular. Cyclin-D1 immunohistochemical stain demonstrates very rare positive plasma cells (1%). Concurrent flow cytometry study ([id number]) shows a plasma cell population with an atypical immunophenotype, comprising approximately 0.04% of analyzed events.    Reviewed by resident/fellow Jackee Marus  Electronically signed by April Bayard, MD on 07/10/2022 at 12:25 PM  Clinical Information   68 year old female with history of plasma cell myeloma.  Gross Examination   A. Peripheral blood and bone marrow labeled with patient's name and medical record number, one peripheral blood smear, three bone marrow aspirate smears and one bone marrow touch preparation are received.   B. "Bone marrow biopsy", in AZF.  Core biopsies of red-brown tissue 1.4 x 0.2 cm in aggregate are submitted in total as block B1 for decalcification.   C. "Bone marrow clot", in AZF.  A 1.8 x 1.8 x 0.3 cm aggregate of red-brown clot is submitted in total as block C1.    J.Elam/ Dr. Odella Bending     RADIOGRAPHIC STUDIES: I have personally reviewed the radiological images as listed and agreed with the findings in the report. No results found.  ASSESSMENT & PLAN:   68 y.o. caucasian female with  1. H/o Multiple myeloma - currently in remission.  But bone marrow biopsy shows she has turned MRD positive  2. IgA lambda R-ISS stage II multiple myeloma with innumerable lytic lesions in the calvarium, lesion in T7 vertebra and multiple lesions in the pelvis.  No significant bone pain at this time. Diagnosed in January 2017.  s/p treatment as noted  above 10/07/18 BM Bx and Flow cytometry indicate that the pt continues to be in CR 10/10/2019 Flow Cytometry revealed "A. Bone Marrow (Plasma Cell Myeloma Minimal Residual Disease Detection by Flow Cytometry): Negative. No phenotypically abnormal plasma cells at or above the limit of detection identified."  PLAN:   -Discussed lab results on 02/20/2027 in detail with patient. CBC showed WBC of 3.1K, hemoglobin of 14.5, and platelets of 236K. -her previous myeloma panel  from 01/23/2024 continued to show undetectable M protein  -did not feel any enlarged lymph nodes during physical examination -discussed that Velcade  and multiple myeloma can cause neuropathy -discussed that fluctuation in electrolytes can also cause tingling/numbness -discussed that it is possible that restless leg can present as her lower extremity tingling as well -discussed option of referral to neurologist to consider nerve conduction study - patient is agreeable -patient has no major toxicities from her treatment -continue maintenance Revlimid  10 MG -continue once a month daratumumab   -Continue acyclovir  for VZV prophylaxis -Continue aspirin  for VTE prophylaxis -discussed that more diarrhea can drop potassium levels  -continue imodium for management of mild diarrhea  -she will follow up with Dr. Bambi Lever on November 11th  FOLLOW-UP:  -Referral to neurology for evaluation of radiculopathy vs neuropathy -continue monthly Dara per integrated scheduling. -  The total time spent in the appointment was 30 minutes* .  All of the patient's questions were answered with apparent satisfaction. The patient knows to call the clinic with any problems, questions or concerns.   Jacquelyn Matt MD MS AAHIVMS [facility] Hematology/Oncology Physician [facility]  .*Total Encounter Time as defined by the Centers for Medicare and Medicaid Services includes, in addition to the face-to-face time of a patient visit (documented in  the note above) non-face-to-face time: obtaining and reviewing outside history, ordering and reviewing medications, tests or procedures, care coordination (communications with other health care professionals or caregivers) and documentation in the medical record.    I,Mitra Faeizi,acting as a Neurosurgeon for Jacquelyn Matt, MD.,have documented all relevant documentation on the behalf of Jacquelyn Matt, MD,as directed by  Jacquelyn Matt, MD while in the presence of Jacquelyn Matt, MD.  .I have reviewed the above documentation for accuracy and completeness, and I agree with the above. .Bralynn Velador Kishore Emylie Amster MD

## 2024-02-20 ENCOUNTER — Inpatient Hospital Stay: Attending: Hematology

## 2024-02-20 ENCOUNTER — Inpatient Hospital Stay (HOSPITAL_BASED_OUTPATIENT_CLINIC_OR_DEPARTMENT_OTHER): Admitting: Hematology

## 2024-02-20 ENCOUNTER — Inpatient Hospital Stay

## 2024-02-20 VITALS — BP 110/67 | HR 85 | Temp 97.7°F | Resp 20 | Wt 133.3 lb

## 2024-02-20 DIAGNOSIS — C9001 Multiple myeloma in remission: Secondary | ICD-10-CM | POA: Insufficient documentation

## 2024-02-20 DIAGNOSIS — R202 Paresthesia of skin: Secondary | ICD-10-CM | POA: Diagnosis not present

## 2024-02-20 DIAGNOSIS — Z79899 Other long term (current) drug therapy: Secondary | ICD-10-CM | POA: Insufficient documentation

## 2024-02-20 DIAGNOSIS — Z5112 Encounter for antineoplastic immunotherapy: Secondary | ICD-10-CM | POA: Insufficient documentation

## 2024-02-20 LAB — CMP (CANCER CENTER ONLY)
ALT: 26 U/L (ref 0–44)
AST: 17 U/L (ref 15–41)
Albumin: 4.5 g/dL (ref 3.5–5.0)
Alkaline Phosphatase: 73 U/L (ref 38–126)
Anion gap: 6 (ref 5–15)
BUN: 15 mg/dL (ref 8–23)
CO2: 29 mmol/L (ref 22–32)
Calcium: 9.4 mg/dL (ref 8.9–10.3)
Chloride: 106 mmol/L (ref 98–111)
Creatinine: 0.78 mg/dL (ref 0.44–1.00)
GFR, Estimated: >60 mL/min (ref >60)
Glucose, Bld: 93 mg/dL (ref 70–99)
Potassium: 4.1 mmol/L (ref 3.5–5.1)
Sodium: 141 mmol/L (ref 135–145)
Total Bilirubin: 1.1 mg/dL (ref 0.0–1.2)
Total Protein: 7 g/dL (ref 6.5–8.1)

## 2024-02-20 LAB — CBC WITH DIFFERENTIAL (CANCER CENTER ONLY)
Abs Immature Granulocytes: 0.01 10*3/uL (ref 0.00–0.07)
Basophils Absolute: 0.1 10*3/uL (ref 0.0–0.1)
Basophils Relative: 2 %
Eosinophils Absolute: 0.1 10*3/uL (ref 0.0–0.5)
Eosinophils Relative: 3 %
HCT: 43.2 % (ref 36.0–46.0)
Hemoglobin: 14.5 g/dL (ref 12.0–15.0)
Immature Granulocytes: 0 %
Lymphocytes Relative: 17 %
Lymphs Abs: 0.5 10*3/uL — ABNORMAL LOW (ref 0.7–4.0)
MCH: 31.5 pg (ref 26.0–34.0)
MCHC: 33.6 g/dL (ref 30.0–36.0)
MCV: 93.7 fL (ref 80.0–100.0)
Monocytes Absolute: 0.5 10*3/uL (ref 0.1–1.0)
Monocytes Relative: 15 %
Neutro Abs: 2 10*3/uL (ref 1.7–7.7)
Neutrophils Relative %: 63 %
Platelet Count: 236 10*3/uL (ref 150–400)
RBC: 4.61 MIL/uL (ref 3.87–5.11)
RDW: 14.1 % (ref 11.5–15.5)
WBC Count: 3.1 10*3/uL — ABNORMAL LOW (ref 4.0–10.5)
nRBC: 0 % (ref 0.0–0.2)

## 2024-02-20 MED ORDER — FAMOTIDINE 20 MG PO TABS
20.0000 mg | ORAL_TABLET | Freq: Once | ORAL | Status: AC
  Administered 2024-02-20: 20 mg via ORAL
  Filled 2024-02-20: qty 1

## 2024-02-20 MED ORDER — DIPHENHYDRAMINE HCL 25 MG PO CAPS
25.0000 mg | ORAL_CAPSULE | Freq: Once | ORAL | Status: AC
  Administered 2024-02-20: 25 mg via ORAL
  Filled 2024-02-20: qty 1

## 2024-02-20 MED ORDER — ACETAMINOPHEN 325 MG PO TABS
650.0000 mg | ORAL_TABLET | Freq: Once | ORAL | Status: AC
  Administered 2024-02-20: 650 mg via ORAL
  Filled 2024-02-20: qty 2

## 2024-02-20 MED ORDER — DARATUMUMAB-HYALURONIDASE-FIHJ 1800-30000 MG-UT/15ML ~~LOC~~ SOLN
1800.0000 mg | Freq: Once | SUBCUTANEOUS | Status: AC
  Administered 2024-02-20: 1800 mg via SUBCUTANEOUS
  Filled 2024-02-20: qty 15

## 2024-02-24 LAB — MULTIPLE MYELOMA PANEL, SERUM
Albumin SerPl Elph-Mcnc: 3.7 g/dL (ref 2.9–4.4)
Albumin/Glob SerPl: 1.5 (ref 0.7–1.7)
Alpha 1: 0.2 g/dL (ref 0.0–0.4)
Alpha2 Glob SerPl Elph-Mcnc: 0.8 g/dL (ref 0.4–1.0)
B-Globulin SerPl Elph-Mcnc: 1 g/dL (ref 0.7–1.3)
Gamma Glob SerPl Elph-Mcnc: 0.6 g/dL (ref 0.4–1.8)
Globulin, Total: 2.6 g/dL (ref 2.2–3.9)
IgA: 117 mg/dL (ref 87–352)
IgG (Immunoglobin G), Serum: 611 mg/dL (ref 586–1602)
IgM (Immunoglobulin M), Srm: 26 mg/dL (ref 26–217)
Total Protein ELP: 6.3 g/dL (ref 6.0–8.5)

## 2024-02-25 ENCOUNTER — Ambulatory Visit: Payer: Medicare Other | Admitting: Cardiology

## 2024-02-26 ENCOUNTER — Encounter: Payer: Self-pay | Admitting: Hematology

## 2024-02-27 ENCOUNTER — Encounter: Payer: Self-pay | Admitting: Neurology

## 2024-02-29 ENCOUNTER — Encounter: Payer: Self-pay | Admitting: Hematology

## 2024-03-10 ENCOUNTER — Other Ambulatory Visit: Payer: Self-pay | Admitting: Hematology

## 2024-03-10 DIAGNOSIS — C9001 Multiple myeloma in remission: Secondary | ICD-10-CM

## 2024-03-11 ENCOUNTER — Ambulatory Visit: Payer: Medicare Other | Admitting: Cardiology

## 2024-03-13 ENCOUNTER — Other Ambulatory Visit: Payer: Self-pay

## 2024-03-13 DIAGNOSIS — C9001 Multiple myeloma in remission: Secondary | ICD-10-CM

## 2024-03-13 MED ORDER — LENALIDOMIDE 10 MG PO CAPS: ORAL_CAPSULE | ORAL | 0 refills | Status: DC

## 2024-03-16 ENCOUNTER — Other Ambulatory Visit: Payer: Self-pay

## 2024-03-16 ENCOUNTER — Encounter (HOSPITAL_BASED_OUTPATIENT_CLINIC_OR_DEPARTMENT_OTHER): Payer: Self-pay | Admitting: Emergency Medicine

## 2024-03-16 ENCOUNTER — Emergency Department (HOSPITAL_BASED_OUTPATIENT_CLINIC_OR_DEPARTMENT_OTHER)

## 2024-03-16 ENCOUNTER — Emergency Department (HOSPITAL_BASED_OUTPATIENT_CLINIC_OR_DEPARTMENT_OTHER): Admission: EM | Admit: 2024-03-16 | Discharge: 2024-03-16 | Disposition: A | Discharge status: Home / Self Care

## 2024-03-16 DIAGNOSIS — Z7982 Long term (current) use of aspirin: Secondary | ICD-10-CM | POA: Diagnosis not present

## 2024-03-16 DIAGNOSIS — M25552 Pain in left hip: Secondary | ICD-10-CM | POA: Diagnosis present

## 2024-03-16 LAB — CBC WITH DIFFERENTIAL/PLATELET
Abs Immature Granulocytes: 0.02 10*3/uL (ref 0.00–0.07)
Basophils Absolute: 0 10*3/uL (ref 0.0–0.1)
Basophils Relative: 1 %
Eosinophils Absolute: 0.1 10*3/uL (ref 0.0–0.5)
Eosinophils Relative: 2 %
HCT: 36.9 % (ref 36.0–46.0)
Hemoglobin: 12.4 g/dL (ref 12.0–15.0)
Immature Granulocytes: 0 %
Lymphocytes Relative: 11 %
Lymphs Abs: 0.5 10*3/uL — ABNORMAL LOW (ref 0.7–4.0)
MCH: 32 pg (ref 26.0–34.0)
MCHC: 33.6 g/dL (ref 30.0–36.0)
MCV: 95.3 fL (ref 80.0–100.0)
Monocytes Absolute: 0.9 10*3/uL (ref 0.1–1.0)
Monocytes Relative: 18 %
Neutro Abs: 3.4 10*3/uL (ref 1.7–7.7)
Neutrophils Relative %: 68 %
Platelets: 202 10*3/uL (ref 150–400)
RBC: 3.87 MIL/uL (ref 3.87–5.11)
RDW: 14.2 % (ref 11.5–15.5)
WBC: 5 10*3/uL (ref 4.0–10.5)
nRBC: 0 % (ref 0.0–0.2)

## 2024-03-16 LAB — BASIC METABOLIC PANEL WITH GFR
Anion gap: 10 (ref 5–15)
BUN: 15 mg/dL (ref 8–23)
CO2: 26 mmol/L (ref 22–32)
Calcium: 8.9 mg/dL (ref 8.9–10.3)
Chloride: 105 mmol/L (ref 98–111)
Creatinine, Ser: 0.87 mg/dL (ref 0.44–1.00)
GFR, Estimated: >60 mL/min (ref >60)
Glucose, Bld: 166 mg/dL — ABNORMAL HIGH (ref 70–99)
Potassium: 4.1 mmol/L (ref 3.5–5.1)
Sodium: 141 mmol/L (ref 135–145)

## 2024-03-16 MED ORDER — ACETAMINOPHEN 500 MG PO TABS
1000.0000 mg | ORAL_TABLET | Freq: Once | ORAL | Status: AC
  Administered 2024-03-16: 1000 mg via ORAL
  Filled 2024-03-16: qty 2

## 2024-03-16 MED ORDER — LIDOCAINE 5 % EX PTCH
1.0000 | MEDICATED_PATCH | Freq: Once | CUTANEOUS | Status: DC
  Administered 2024-03-16: 1 via TRANSDERMAL
  Filled 2024-03-16: qty 1

## 2024-03-16 MED ORDER — IBUPROFEN 400 MG PO TABS
600.0000 mg | ORAL_TABLET | Freq: Once | ORAL | Status: AC
  Administered 2024-03-16: 600 mg via ORAL
  Filled 2024-03-16: qty 1

## 2024-03-16 MED ORDER — METHOCARBAMOL 500 MG PO TABS: 500.0000 mg | ORAL_TABLET | Freq: Two times a day (BID) | ORAL | 0 refills | Status: DC | PRN

## 2024-03-16 MED ORDER — METHOCARBAMOL 500 MG PO TABS
500.0000 mg | ORAL_TABLET | Freq: Once | ORAL | Status: AC
  Administered 2024-03-16: 500 mg via ORAL
  Filled 2024-03-16: qty 1

## 2024-03-16 MED ORDER — LIDOCAINE 5 % EX PTCH: 1.0000 | MEDICATED_PATCH | CUTANEOUS | 0 refills | Status: AC

## 2024-03-16 NOTE — ED Provider Notes (Signed)
 [place] [facility] AT [facility] Provider Note   CSN: [id number] Arrival date & time: 03/16/24  1344     Patient presents with: No chief complaint on file.   Paula Johnson is a 68 y.o. female.   This is a 68 year old female history of multiple myeloma presenting the emergency department for left hip pain.  Primarily located iliac crest/SI joint on the left-hand side.  Started Wednesday morning after she woke up.  Tried doing some yoga stretches, but made symptoms worse.  Able to ambulate, but painful.  No numbness tingling changes in sensation.  No constitutional symptoms.  No bowel bladder dysfunction.  No saddle anesthesia.  No back pain.        Prior to Admission medications   Medication Sig Start Date End Date Taking? Authorizing Provider  acyclovir  (ZOVIRAX ) 400 MG tablet Take 1 tablet by mouth twice daily 03/10/24   Kale, Gautam Kishore, MD  Ascorbic Acid (VITAMIN C) 1000 MG tablet Take 1,000 mg by mouth 2 (two) times daily.    [provider]  aspirin  EC 81 MG tablet Take 81 mg by mouth daily.    [provider]  b complex vitamins capsule Take 1 capsule by mouth daily.    [provider]  Cholecalciferol  (VITAMIN D3) 30 MCG/15ML LIQD Take by mouth. Pt. Takes 5000 IU daily    [provider]  Cyanocobalamin (B-12) 1000 MCG CAPS     [provider]  EPINEPHrine  0.3 mg/0.3 mL IJ SOAJ injection INJECT INTRAMUSCULARLLY AS DIRECTED AS NEEDED FOR ANAPHYLAXIS    [provider]  estradiol  (ESTRACE ) 0.1 MG/GM vaginal cream INSERT  1 GRAM VAGINALLY TWICE A WEEK AT BEDTIME 09/25/23   Amundson C Silva, Brook E, MD  Iron, Ferrous Sulfate, 325 (65 Fe) MG TABS Take 325 mg by mouth daily. 02/02/23   [provider]  lenalidomide  (REVLIMID ) 10 MG capsule TAKE ONE CAPSULE BY MOUTH DAILY FOR 21 DAYS, THEN 7 DAYS OFF 03/13/24   Onesimo Emaline Brink, MD  LORazepam  (ATIVAN ) 0.5 MG tablet Take 1 tablet (0.5 mg  total) by mouth at bedtime as needed for sleep or anxiety. Patient not taking: Reported on 01/17/2024 02/23/23   Onesimo Emaline Brink, MD  Magnesium  400 MG CAPS Take by mouth.    [provider]  Multiple Vitamins-Minerals (PRESERVISION AREDS PO) Take 1 capsule by mouth 2 (two) times daily.    [provider]  tiZANidine (ZANAFLEX) 4 MG tablet Take by mouth. Patient not taking: Reported on 01/17/2024 12/20/23   [provider]    Allergies: Ampicillin and Penicillins    Review of Systems  Updated Vital Signs BP 136/78 (BP Location: Right Arm)   Pulse (!) 101   Temp 98.7 F (37.1 C)   Resp 18   Wt 59 kg   LMP 09/26/1995 (Within Months)   SpO2 98%   BMI 19.77 kg/m   Physical Exam Vitals and nursing note reviewed.  Constitutional:      General: She is not in acute distress.    Appearance: She is not toxic-appearing.  HENT:     Nose: Nose normal.     Mouth/Throat:     Mouth: Mucous membranes are moist.   Eyes:     Conjunctiva/sclera: Conjunctivae normal.    Cardiovascular:     Rate and Rhythm: Normal rate and regular rhythm.  Pulmonary:     Effort: Pulmonary effort is normal.  Abdominal:     General: Abdomen is flat.  There is no distension.     Tenderness: There is no abdominal tenderness. There is no guarding or rebound.   Musculoskeletal:     Comments: No midline spinal tenderness.  5 out of 5 plantarflexion, dorsiflexion.  Normal sensation in lower extremities.  5 out of 5 hip extension.  Does have some tenderness in the left hip with moving right leg.  Some minor tenderness to the left iliac crest/SI joint.   Skin:    General: Skin is warm.     Capillary Refill: Capillary refill takes less than 2 seconds.   Neurological:     Mental Status: She is alert and oriented to person, place, and time.   Psychiatric:        Mood and Affect: Mood normal.        Behavior: Behavior normal.     (all labs ordered are listed, but only abnormal  results are displayed) Labs Reviewed  CBC WITH DIFFERENTIAL/PLATELET - Abnormal; Notable for the following components:      Result Value   Lymphs Abs 0.5 (*)    All other components within normal limits  BASIC METABOLIC PANEL WITH GFR - Abnormal; Notable for the following components:   Glucose, Bld 166 (*)    All other components within normal limits    EKG: None  Radiology: DG HIP UNILAT WITH PELVIS 2-3 VIEWS LEFT Result Date: 03/16/2024 CLINICAL DATA:  Left hip pain. No known injury. History of multiple myeloma. EXAM: DG HIP (WITH OR WITHOUT PELVIS) 2-3V LEFT COMPARISON:  08/10/2017 FINDINGS: There is no evidence of hip fracture or dislocation. No focal lytic lesion is seen radiographically as compared with prior exams. There is no evidence of arthropathy or other focal bone abnormality. Degenerative changes are present in the lower lumbar spine. IMPRESSION: No acute fracture or dislocation. Electronically Signed   By: Leita Birmingham M.D.   On: 03/16/2024 15:38     Procedures   Medications Ordered in the ED  lidocaine  (LIDODERM ) 5 % 1 patch (1 patch Transdermal Patch Applied 03/16/24 1536)  acetaminophen  (TYLENOL ) tablet 1,000 mg (1,000 mg Oral Given 03/16/24 1535)  ibuprofen  (ADVIL ) tablet 600 mg (600 mg Oral Given 03/16/24 1536)  methocarbamol (ROBAXIN) tablet 500 mg (500 mg Oral Given 03/16/24 1535)    Clinical Course as of 03/16/24 1629  Sun Mar 16, 2024  1557 DG HIP UNILAT WITH PELVIS 2-3 VIEWS LEFT IMPRESSION: No acute fracture or dislocation.   [TY]  1557 CBC with Differential(!) No significant abnormalities. [TY]    Clinical Course User Index [TY] Neysa Caron PARAS, DO                                 Medical Decision Making This is a 68 year old female presenting the emergency department for left sacral pain.  History of multiple myeloma.  Reportedly in remission.  She is afebrile, mildly elevated heart rate.  Normotensive.  Does not appear to be in distress or toxic.   Has good motor strength and sensation.  Does have some pain with movement.  Will get basic labs given her mental myeloma history as well as x-ray to evaluate for pathologic fracture.  Treat with multimodal pain medications.  However I suspect symptoms might be secondary to somatic dysfunction/MSK versus arthritis.  See ED course for final MDM and disposition.  Updated MDM.  X-rays negative.  Labs reassuring without abnormality.  Patient feeling improved with multimodal pain medications.  Will discharge at this time.  Follow-up with PCP.  Return precautions given  Amount and/or Complexity of Data Reviewed External Data Reviewed:     Details: Had a DEXA scan 2024 with bone density is within normal limits per chart review. Labs: ordered. Decision-making details documented in ED Course.    Details: Given patient's multiple myeloma history we will get basic labs Radiology: ordered and independent interpretation performed. Decision-making details documented in ED Course.    Details: Given patient's multiple myeloma history will get x-ray to evaluate for pathologic fracture.  Risk OTC drugs. Prescription drug management.       Final diagnoses:  None    ED Discharge Orders     None          Neysa Caron PARAS, DO 03/16/24 1629

## 2024-03-16 NOTE — Discharge Instructions (Signed)
 Please follow-up with your primary doctor.  You may take Tylenol  alternating with ibuprofen  for baseline pain control.  We are also prescribing you lidocaine  patches and muscle relaxer.  Do not drive or operate heavy machinery with muscle relaxers and can cause you to be drowsy.  Also please use caution as the muscle relaxer does predispose your age group to falls.

## 2024-03-16 NOTE — ED Triage Notes (Signed)
 Left sacral pain started Wednesday. Has worsened, no known cause. Bowel and bladder working ok.   Pt has multiple myeloma ,remission for 2017, just had checkup may 28th.

## 2024-03-19 ENCOUNTER — Inpatient Hospital Stay: Attending: Hematology

## 2024-03-19 ENCOUNTER — Inpatient Hospital Stay

## 2024-03-19 ENCOUNTER — Inpatient Hospital Stay (HOSPITAL_BASED_OUTPATIENT_CLINIC_OR_DEPARTMENT_OTHER): Admitting: Physician Assistant

## 2024-03-19 ENCOUNTER — Other Ambulatory Visit: Payer: Self-pay

## 2024-03-19 VITALS — BP 118/67 | HR 87 | Temp 97.9°F | Resp 18 | Ht 68.0 in | Wt 132.1 lb

## 2024-03-19 DIAGNOSIS — C9000 Multiple myeloma not having achieved remission: Secondary | ICD-10-CM | POA: Diagnosis present

## 2024-03-19 DIAGNOSIS — C9001 Multiple myeloma in remission: Secondary | ICD-10-CM

## 2024-03-19 DIAGNOSIS — Z79899 Other long term (current) drug therapy: Secondary | ICD-10-CM | POA: Insufficient documentation

## 2024-03-19 DIAGNOSIS — Z5112 Encounter for antineoplastic immunotherapy: Secondary | ICD-10-CM | POA: Insufficient documentation

## 2024-03-19 LAB — CMP (CANCER CENTER ONLY)
ALT: 26 U/L (ref 0–44)
AST: 13 U/L — ABNORMAL LOW (ref 15–41)
Albumin: 4.1 g/dL (ref 3.5–5.0)
Alkaline Phosphatase: 79 U/L (ref 38–126)
Anion gap: 6 (ref 5–15)
BUN: 14 mg/dL (ref 8–23)
CO2: 27 mmol/L (ref 22–32)
Calcium: 9.4 mg/dL (ref 8.9–10.3)
Chloride: 107 mmol/L (ref 98–111)
Creatinine: 0.88 mg/dL (ref 0.44–1.00)
GFR, Estimated: >60 mL/min (ref >60)
Glucose, Bld: 94 mg/dL (ref 70–99)
Potassium: 4.1 mmol/L (ref 3.5–5.1)
Sodium: 140 mmol/L (ref 135–145)
Total Bilirubin: 0.9 mg/dL (ref 0.0–1.2)
Total Protein: 6.9 g/dL (ref 6.5–8.1)

## 2024-03-19 LAB — CBC WITH DIFFERENTIAL (CANCER CENTER ONLY)
Abs Immature Granulocytes: 0.01 10*3/uL (ref 0.00–0.07)
Basophils Absolute: 0 10*3/uL (ref 0.0–0.1)
Basophils Relative: 1 %
Eosinophils Absolute: 0.1 10*3/uL (ref 0.0–0.5)
Eosinophils Relative: 3 %
HCT: 37.6 % (ref 36.0–46.0)
Hemoglobin: 12.9 g/dL (ref 12.0–15.0)
Immature Granulocytes: 0 %
Lymphocytes Relative: 14 %
Lymphs Abs: 0.5 10*3/uL — ABNORMAL LOW (ref 0.7–4.0)
MCH: 31.7 pg (ref 26.0–34.0)
MCHC: 34.3 g/dL (ref 30.0–36.0)
MCV: 92.4 fL (ref 80.0–100.0)
Monocytes Absolute: 0.6 10*3/uL (ref 0.1–1.0)
Monocytes Relative: 17 %
Neutro Abs: 2.4 10*3/uL (ref 1.7–7.7)
Neutrophils Relative %: 65 %
Platelet Count: 250 10*3/uL (ref 150–400)
RBC: 4.07 MIL/uL (ref 3.87–5.11)
RDW: 14.1 % (ref 11.5–15.5)
WBC Count: 3.6 10*3/uL — ABNORMAL LOW (ref 4.0–10.5)
nRBC: 0 % (ref 0.0–0.2)

## 2024-03-19 MED ORDER — FAMOTIDINE 20 MG PO TABS
20.0000 mg | ORAL_TABLET | Freq: Once | ORAL | Status: AC
  Administered 2024-03-19: 20 mg via ORAL
  Filled 2024-03-19: qty 1

## 2024-03-19 MED ORDER — DIPHENHYDRAMINE HCL 25 MG PO CAPS
25.0000 mg | ORAL_CAPSULE | Freq: Once | ORAL | Status: AC
  Administered 2024-03-19: 25 mg via ORAL
  Filled 2024-03-19: qty 1

## 2024-03-19 MED ORDER — ACETAMINOPHEN 325 MG PO TABS
650.0000 mg | ORAL_TABLET | Freq: Once | ORAL | Status: AC
  Administered 2024-03-19: 650 mg via ORAL
  Filled 2024-03-19: qty 2

## 2024-03-19 MED ORDER — DARATUMUMAB-HYALURONIDASE-FIHJ 1800-30000 MG-UT/15ML ~~LOC~~ SOLN
1800.0000 mg | Freq: Once | SUBCUTANEOUS | Status: AC
  Administered 2024-03-19: 1800 mg via SUBCUTANEOUS
  Filled 2024-03-19: qty 15

## 2024-03-19 NOTE — Progress Notes (Signed)
 HEMATOLOGY/ONCOLOGY CLINIC NOTE  Date of Service: 03/19/24  PCP: Seabron Lenis, MD Transplant Oncologist -- Dr Lela Pound MD  CHIEF COMPLAINT: Follow-up for continued evaluation and management of multiple myeloma  DIAGNOSIS: IgA lambda R-ISS stage II multiple myeloma with innumerable lytic lesions in the calvarium, lesion in T7 vertebra and multiple lesions in the pelvis.  Diagnosed in January 2017.   Current treatment: Revlimid  10mg  po daily 3 weeks on 1 week off  Daratumumab  added 11/15 for loss of MRD negative status   Previous Treatment: Status post Vd x 1 cycle VRd x 6 cycles   HD Melphalan 200mg /m2 on 03/22/2016 Autologous HSCT on 03/23/2016 with Dr Oscar at [facility] (Dose of 6.4 x10^6/kg)   INTERVAL HISTORY:   Paula Johnson is a 68 y.o. female is here for continued evaluation and management of multiple myeloma She is due for Cycle 22, Day 1 of Daratumumab . She is unaccompanied for this visit.   Paula Johnson continues to tolerate Daratumumab  without any concerning symptoms. She reports her energy and appetite are overall stable. She recently was evaluated in the ED on 03/16/2024 for worsening left hip pain. Xrays were obtained that ruled out fracture and any acute process. She reports the pain is much improved but does radiate to her low back. She rates the pain as 2 out of 10 on a pain scale. She takes ibuprofen  occasionally and plans to wear a lidocaine  patch.  She denies any nausea, vomiting or abdominal pain. Her bowel habits are unchanged. She denies easy bruising or signs of active bleeding. She denies fevers, chills, sweats, shortness of breath, chest pain or cough. She has no other complaints.   Past Medical History:  Diagnosis Date   Abnormal Pap smear of vagina 03/22/2017   ASCUS pap and negative HR HPV.  Patient is on immunosuppressive medication.    Allergic reaction to bee sting    Anemia    AR (aortic regurgitation) 11/30/2017   trace noted on ECHO    Atrial septal aneurysm per 11-30-17 echo   trivial pericardial effusion   Complicated migraine    Constipation    DDD (degenerative disc disease), cervical    DDD (degenerative disc disease), lumbosacral    Fatty liver    Female proctocele without uterine prolapse    Fibroid    reason for Hysterectomy   FTT (failure to thrive) in adult 08/09/2017   Gastroesophageal reflux disease without esophagitis 01/17/2017   Generalized anxiety disorder    Grade I diastolic dysfunction    Hammer toe 05/13/2018   Hematuria 11/08/2015   History of COVID-19 12/2020   History of stress incontinence    Hypergammaglobulinemia    Hyperlipidemia    Hypoxemia 12/16/2007   Insomnia    Leg length discrepancy    Loss of appetite    Lung nodule    Malignant neoplasm of thymus (HCC) 12/03/2007   Metastatic multiple myeloma to bone (HCC) 10/19/2015   Metatarsalgia of right foot 07/15/2018   MR (mitral regurgitation) 11/30/2017   trace noted on ECHO   Muscular fasciculation    Nasal septal deviation    Nasal turbinate hypertrophy    Near syncope    Obstructive sleep apnea 12/03/2007   Other fatigue    Peripheral edema    Plasma cell dyscrasia    PONV (postoperative nausea and vomiting)    thinks morphine  caused nausea   Postoperative urinary retention 05/14/2018   Prediabetes    Pure hypercholesterolemia    Rash 11/01/2015  Restless leg syndrome    S/P autologous bone marrow transplantation (HCC) 03/29/2016   Shortness of breath 12/04/2007   TR (tricuspid regurgitation) 11/30/2017   trace noted on ECHO   Urinary incontinence    UTI (urinary tract infection)    Vaginal granulation tissue 12/03/2017   Vitamin D  deficiency    Wears glasses    Social History   Socioeconomic History   Marital status: Married    Spouse name: Carlin   Number of children: 2   Years of education: 16   Highest education level: Bachelor's degree (e.g., BA, AB, BS)  Occupational History   Occupation: Retired     Comment: Engineer, 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[employer]  Tobacco Use   Smoking status: Never   Smokeless tobacco: Never  Vaping Use   Vaping status: Never Used  Substance and Sexual Activity   Alcohol use: Not Currently    Alcohol/week: 0.0 standard drinks of alcohol   Drug use: Never   Sexual activity: Not Currently    Partners: Male    Birth control/protection: Surgical    Comment: TAH--ovaries remain  Other Topics Concern   Not on file  Social History Narrative   Lives with husband   Caffeine- coffee, 2 cups daily   Right handed   Social Drivers of Corporate investment banker Strain: Not on file  Food Insecurity: Not on file  Transportation Needs: Not on file  Physical Activity: Not on file  Stress: Not on file  Social Connections: Not on file   Family History  Problem Relation Age of Onset   Hyperlipidemia Mother    Memory loss Mother        90s; believed to be age-related memory decline   COPD Father        dec age 34/s-smoked   Macular degeneration Father    Behavior problems Father        at the end of life   Diabetes Brother    Macular degeneration Brother    Suicidality Brother    Diabetes Maternal Grandmother    Heart attack Paternal Grandfather    Past Surgical History:  Procedure Laterality Date   ABDOMINAL HYSTERECTOMY  1997   TAH--ovaries remain--Dr. Debby Lares   ANTERIOR AND POSTERIOR REPAIR N/A 05/14/2018   Procedure: ANTERIOR (CYSTOCELE) AND POSTERIOR REPAIR (RECTOCELE);  Surgeon: Cathlyn JAYSON Nikki Bobie FORBES, MD;  Location: [facility];  Service: Gynecology;  Laterality: N/A;   BLADDER SUSPENSION N/A 05/14/2018   Procedure: TRANSVAGINAL TAPE (TVT) PROCEDURE exact midurethral sling;  Surgeon: Cathlyn JAYSON Nikki Bobie FORBES, MD;  Location: [facility];  Service: Gynecology;  Laterality: N/A;   BONE MARROW TRANSPLANT  2017   done at [facility]   BREAST BIOPSY     COLONOSCOPY     CYSTOSCOPY N/A 05/14/2018   Procedure: CYSTOSCOPY;  Surgeon: Cathlyn JAYSON Nikki Bobie FORBES, MD;  Location: [facility];  Service: Gynecology;  Laterality: N/A;   CYSTOSCOPY N/A 05/30/2018   Procedure: CYSTOSCOPY;  Surgeon: Cathlyn JAYSON Nikki Bobie FORBES, MD;  Location: [facility];  Service: Gynecology;  Laterality: N/A;   ROBOTIC ASSISTED LAPAROSCOPIC SACROCOLPOPEXY N/A 05/14/2018   Procedure: XI ROBOTIC ASSISTED LAPAROSCOPIC SACROCOLPOPEXY;  Surgeon: Cathlyn JAYSON Nikki Bobie FORBES, MD;  Location: [facility];  Service: Gynecology;  Laterality: N/A;  6 hours OR time. Need extended stay recovery bed.   SALPINGOOPHORECTOMY Bilateral 05/14/2018   Procedure: SALPINGO OOPHORECTOMY;  Surgeon: Cathlyn JAYSON Nikki Bobie FORBES, MD;  Location: [facility];  Service: Gynecology;  Laterality: Bilateral;   THYMECTOMY  1999   TRANSVAGINAL TAPE (TVT) REMOVAL N/A 05/30/2018   Procedure: TRANSVAGINAL TAPE (TVT)  Revision;  Surgeon: Cathlyn JAYSON Nikki Bobie FORBES, MD;  Location: [facility];  Service: Gynecology;  Laterality: N/A;   WISDOM TOOTH EXTRACTION      REVIEW OF SYSTEMS:   10 Point review of Systems was done is negative except as noted above.   PHYSICAL EXAMINATION: .BP 118/67 (BP Location: Right Arm, Patient Position: Sitting)   Pulse 87   Temp 97.9 F (36.6 C) (Tympanic)   Resp 18   Ht 5' 8 (1.727 m)   Wt 132 lb 1.6 oz (59.9 kg)   LMP 09/26/1995 (Within Months)   SpO2 97%   BMI 20.09 kg/m   GENERAL:alert, in no acute distress and comfortable SKIN: no acute rashes, no significant lesions EYES: conjunctiva are pink and non-injected, sclera anicteric LUNGS: clear to auscultation b/l with normal respiratory effort HEART: regular rate & rhythm Extremity: no pedal edema PSYCH: alert & oriented x 3 with fluent speech NEURO: no focal motor/sensory deficits   MEDICAL HISTORY:   Past Medical History:  Diagnosis Date   Abnormal Pap smear of vagina 03/22/2017   ASCUS pap and negative HR HPV.  Patient is on immunosuppressive medication.    Allergic reaction to bee sting    Anemia    AR (aortic regurgitation)  11/30/2017   trace noted on ECHO   Atrial septal aneurysm per 11-30-17 echo   trivial pericardial effusion   Complicated migraine    Constipation    DDD (degenerative disc disease), cervical    DDD (degenerative disc disease), lumbosacral    Fatty liver    Female proctocele without uterine prolapse    Fibroid    reason for Hysterectomy   FTT (failure to thrive) in adult 08/09/2017   Gastroesophageal reflux disease without esophagitis 01/17/2017   Generalized anxiety disorder    Grade I diastolic dysfunction    Hammer toe 05/13/2018   Hematuria 11/08/2015   History of COVID-19 12/2020   History of stress incontinence    Hypergammaglobulinemia    Hyperlipidemia    Hypoxemia 12/16/2007   Insomnia    Leg length discrepancy    Loss of appetite    Lung nodule    Malignant neoplasm of thymus (HCC) 12/03/2007   Metastatic multiple myeloma to bone (HCC) 10/19/2015   Metatarsalgia of right foot 07/15/2018   MR (mitral regurgitation) 11/30/2017   trace noted on ECHO   Muscular fasciculation    Nasal septal deviation    Nasal turbinate hypertrophy    Near syncope    Obstructive sleep apnea 12/03/2007   Other fatigue    Peripheral edema    Plasma cell dyscrasia    PONV (postoperative nausea and vomiting)    thinks morphine  caused nausea   Postoperative urinary retention 05/14/2018   Prediabetes    Pure hypercholesterolemia    Rash 11/01/2015   Restless leg syndrome    S/P autologous bone marrow transplantation (HCC) 03/29/2016   Shortness of breath 12/04/2007   TR (tricuspid regurgitation) 11/30/2017   trace noted on ECHO   Urinary incontinence    UTI (urinary tract infection)    Vaginal granulation tissue 12/03/2017   Vitamin D  deficiency    Wears glasses     SURGICAL HISTORY: Past Surgical History:  Procedure Laterality Date   ABDOMINAL HYSTERECTOMY  1997   TAH--ovaries remain--Dr. Debby Lares   ANTERIOR AND POSTERIOR REPAIR N/A 05/14/2018  Procedure: ANTERIOR  (CYSTOCELE) AND POSTERIOR REPAIR (RECTOCELE);  Surgeon: Cathlyn JAYSON Nikki Bobie FORBES, MD;  Location: [facility];  Service: Gynecology;  Laterality: N/A;   BLADDER SUSPENSION N/A 05/14/2018   Procedure: TRANSVAGINAL TAPE (TVT) PROCEDURE exact midurethral sling;  Surgeon: Cathlyn JAYSON Nikki Bobie FORBES, MD;  Location: [facility];  Service: Gynecology;  Laterality: N/A;   BONE MARROW TRANSPLANT  2017   done at [facility]   BREAST BIOPSY     COLONOSCOPY     CYSTOSCOPY N/A 05/14/2018   Procedure: CYSTOSCOPY;  Surgeon: Cathlyn JAYSON Nikki Bobie FORBES, MD;  Location: [facility];  Service: Gynecology;  Laterality: N/A;   CYSTOSCOPY N/A 05/30/2018   Procedure: CYSTOSCOPY;  Surgeon: Cathlyn JAYSON Nikki Bobie FORBES, MD;  Location: [facility];  Service: Gynecology;  Laterality: N/A;   ROBOTIC ASSISTED LAPAROSCOPIC SACROCOLPOPEXY N/A 05/14/2018   Procedure: XI ROBOTIC ASSISTED LAPAROSCOPIC SACROCOLPOPEXY;  Surgeon: Cathlyn JAYSON Nikki Bobie FORBES, MD;  Location: [facility];  Service: Gynecology;  Laterality: N/A;  6 hours OR time. Need extended stay recovery bed.   SALPINGOOPHORECTOMY Bilateral 05/14/2018   Procedure: SALPINGO OOPHORECTOMY;  Surgeon: Cathlyn JAYSON Nikki Bobie FORBES, MD;  Location: [facility];  Service: Gynecology;  Laterality: Bilateral;   THYMECTOMY  1999   TRANSVAGINAL TAPE (TVT) REMOVAL N/A 05/30/2018   Procedure: TRANSVAGINAL TAPE (TVT)  Revision;  Surgeon: Cathlyn JAYSON Nikki Bobie FORBES, MD;  Location: [facility];  Service: Gynecology;  Laterality: N/A;   WISDOM TOOTH EXTRACTION      SOCIAL HISTORY: Social History   Socioeconomic History   Marital status: Married    Spouse name: Carlin   Number of children: 2   Years of education: 16   Highest education level: Bachelor's degree (e.g., BA, AB, BS)  Occupational History   Occupation: Retired    Comment: Engineer, 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[employer]  Tobacco Use   Smoking status: Never   Smokeless tobacco: Never  Vaping Use   Vaping status: Never Used  Substance and Sexual  Activity   Alcohol use: Not Currently    Alcohol/week: 0.0 standard drinks of alcohol   Drug use: Never   Sexual activity: Not Currently    Partners: Male    Birth control/protection: Surgical    Comment: TAH--ovaries remain  Other Topics Concern   Not on file  Social History Narrative   Lives with husband   Caffeine- coffee, 2 cups daily   Right handed   Social Drivers of Corporate investment banker Strain: Not on file  Food Insecurity: Not on file  Transportation Needs: Not on file  Physical Activity: Not on file  Stress: Not on file  Social Connections: Not on file  Intimate Partner Violence: Not on file    FAMILY HISTORY: Family History  Problem Relation Age of Onset   Hyperlipidemia Mother    Memory loss Mother        90s; believed to be age-related memory decline   COPD Father        dec age 68/s-smoked   Macular degeneration Father    Behavior problems Father        at the end of life   Diabetes Brother    Macular degeneration Brother    Suicidality Brother    Diabetes Maternal Grandmother    Heart attack Paternal Grandfather     ALLERGIES:  is allergic to ampicillin and penicillins.  MEDICATIONS:  . Current Outpatient Medications:    acyclovir  (ZOVIRAX ) 400 MG tablet, Take 1  tablet by mouth twice daily, Disp: 60 tablet, Rfl: 0   Ascorbic Acid (VITAMIN C) 1000 MG tablet, Take 1,000 mg by mouth 2 (two) times daily., Disp: , Rfl:    aspirin  EC 81 MG tablet, Take 81 mg by mouth daily., Disp: , Rfl:    b complex vitamins capsule, Take 1 capsule by mouth daily., Disp: , Rfl:    Cholecalciferol  (VITAMIN D3) 30 MCG/15ML LIQD, Take by mouth. Pt. Takes 5000 IU daily, Disp: , Rfl:    Cyanocobalamin (B-12) 1000 MCG CAPS, , Disp: , Rfl:    EPINEPHrine  0.3 mg/0.3 mL IJ SOAJ injection, INJECT INTRAMUSCULARLLY AS DIRECTED AS NEEDED FOR ANAPHYLAXIS, Disp: , Rfl:    estradiol  (ESTRACE ) 0.1 MG/GM vaginal cream, INSERT  1 GRAM VAGINALLY TWICE A WEEK AT BEDTIME, Disp: 43 g,  Rfl: 1   Iron, Ferrous Sulfate, 325 (65 Fe) MG TABS, Take 325 mg by mouth daily., Disp: , Rfl:    lenalidomide  (REVLIMID ) 10 MG capsule, TAKE ONE CAPSULE BY MOUTH DAILY FOR 21 DAYS, THEN 7 DAYS OFF, Disp: 21 capsule, Rfl: 0   lidocaine  (LIDODERM ) 5 %, Place 1 patch onto the skin daily. Remove & Discard patch within 12 hours or as directed by MD, Disp: 14 patch, Rfl: 0   Magnesium  400 MG CAPS, Take by mouth., Disp: , Rfl:    methocarbamol (ROBAXIN) 500 MG tablet, Take 1 tablet (500 mg total) by mouth 2 (two) times daily as needed for muscle spasms., Disp: 14 tablet, Rfl: 0   Multiple Vitamins-Minerals (PRESERVISION AREDS PO), Take 1 capsule by mouth 2 (two) times daily., Disp: , Rfl:    LORazepam  (ATIVAN ) 0.5 MG tablet, Take 1 tablet (0.5 mg total) by mouth at bedtime as needed for sleep or anxiety. (Patient not taking: Reported on 03/19/2024), Disp: 30 tablet, Rfl: 0   tiZANidine (ZANAFLEX) 4 MG tablet, Take by mouth. (Patient not taking: Reported on 03/19/2024), Disp: , Rfl:    LABORATORY DATA:  I have reviewed the data as listed     Latest Ref Rng & Units 03/19/2024    9:24 AM 03/16/2024    3:27 PM 02/20/2024   11:59 AM  CBC  WBC 4.0 - 10.5 K/uL 3.6  5.0  3.1   Hemoglobin 12.0 - 15.0 g/dL 87.0  87.5  85.4   Hematocrit 36.0 - 46.0 % 37.6  36.9  43.2   Platelets 150 - 400 K/uL 250  202  236       Latest Ref Rng & Units 03/16/2024    3:27 PM 02/20/2024   11:59 AM 01/23/2024   10:43 AM  CMP  Glucose 70 - 99 mg/dL 833  93  93   BUN 8 - 23 mg/dL 15  15  15    Creatinine 0.44 - 1.00 mg/dL 9.12  9.21  9.14   Sodium 135 - 145 mmol/L 141  141  140   Potassium 3.5 - 5.1 mmol/L 4.1  4.1  4.3   Chloride 98 - 111 mmol/L 105  106  105   CO2 22 - 32 mmol/L 26  29  30    Calcium  8.9 - 10.3 mg/dL 8.9  9.4  9.6   Total Protein 6.5 - 8.1 g/dL  7.0  7.0   Total Bilirubin 0.0 - 1.2 mg/dL  1.1  1.3   Alkaline Phos 38 - 126 U/L  73  69   AST 15 - 41 U/L  17  16   ALT 0 - 44 U/L  26  20      10/10/2019  Flow Cytometry (Repeat BM Bx from [facility]):   10/07/18 Repeat BM Bx from [facility]:   Surgical Pathology                                Case: [id number]                               Authorizing Provider:  Cheyenne Anette Dragon, NP    Collected:           07/04/2022 1015             Ordering Location:     [facility] and   Received:            07/04/2022 1048                                    [facility]                                                   Pathologist:           Elva Sheree Feast, MD                                                   Specimens:   A) - Bone Marrow, Aspirate                                                                        B) - Bone Marrow, Biopsy                                                                          C) - Bone Marrow, Clot                                                                 DIAGNOSIS   A-C. Peripheral blood and bone marrow (peripheral smear, aspirate, touch preparation, core biopsy, and clot section): - Low-level plasma cell neoplasm, see comment.   COMMENT: The core biopsy is 40% cellular. Cyclin-D1 immunohistochemical stain demonstrates very rare positive plasma cells (1%). Concurrent flow cytometry study ([id number]) shows a plasma cell population with an atypical immunophenotype, comprising approximately 0.04% of analyzed events.    Reviewed  by resident/fellow SINA RUSH  Electronically signed by Elva Sheree Feast, MD on 07/10/2022 at 12:25 PM  Clinical Information   68 year old female with history of plasma cell myeloma.  Gross Examination   A. Peripheral blood and bone marrow labeled with patient's name and medical record number, one peripheral blood smear, three bone marrow aspirate smears and one bone marrow touch preparation are received.   B. Bone marrow biopsy, in [facility].  Core biopsies of red-brown tissue 1.4 x 0.2 cm in aggregate are submitted in total as block B1 for decalcification.    C. Bone marrow clot, in [facility].  A 1.8 x 1.8 x 0.3 cm aggregate of red-brown clot is submitted in total as block C1.    J.Elam/ Dr. Juanita   Microscopic Examination        Lab Results  Component Value Date    WBC 5.2 07/04/2022    HGB 12.1 07/04/2022    PLT 364 07/04/2022    MCV 95 07/04/2022    RDWCV 14.6 (H) 07/04/2022    Peripheral blood and automated blood count:  RBCs: The red blood cells are adequate in number,  normocytic and normochromic, with minimal anisopoikilocytosis. WBCs: The white blood cells are adequate in number and show unremarkable morphology. Platelets: The platelets are adequate in number and show unremarkable morphology.   Bone marrow aspirate:  There are adequate cellular particles. M:E ratio is 3:1.  Granulopoiesis: Maturing myeloid elements are present and show complete maturation. Erythropoiesis:  Erythroid elements are present and show complete maturation. Megakaryocytes: Megakaryocytes are adequate in number and show unremarkable morphology.  There are slightly increased plasma cells with unremarkable morphology.   Bone marrow touch preparation: Adequate marrow elements are present, without additional findings to the aspirate smears.   Prussian blue stained aspirate smear: 0 of 4 stainable iron is present. No ringed sideroblasts are identified.   Bone marrow core biopsy:  The core biopsy is adequate for evaluation. The cellularity is 40%. Maturing myeloid elements are present and show complete maturation. Maturing erythroid elements are present and show complete maturation.  Megakaryocytes are adequate in number and show unremarkable morphology. There are slightly increased plasma cells.   A reticulin stain of the core biopsy shows no significant reticulin fibrosis.   Aspirate clot section: There are adequate particles present, without additional findings to the core biopsy.   Immunohistochemical stains for Cyclin-D1, CD138, Kappa CISH, and Lambda  CISH are performed on the core biopsy section (block B1). CD138, kappa, and lambda in-situ hybridization demonstrates 5-10% predominantly polytypic plasma cells. Cyclin-D1 demonstrates very rare positive plasma cells (1%).  Bone Marrow Differential   500 CELL DIFF   Cell Type Cell Count Reference Range Cell Type Cell Count Reference Range Cell Type Cell Count Reference Range  Segs 23 (7-25) Lymphocytes 15 (3-20) Plasma Cells 8 (0-3.5)  Bands 3 (6-36)     Atypicals 0   RBC Precursors 22 (10-30)  Metas 2 (9-25) Lymphoblasts *   Pronormoblasts 0 (0-3)  Myelos 5 (8-15) Eosinophils 12 (0-4) M:E Ratio 3/1    Promyelos 1 (1-6) Basophils 0 (0-1) Iron 0 (Scale 0-4+)  Myeloblasts 1 (0-3.5) Monocytes 8 (0-2)  Performed by:    ALMARIE SOJA                                                                                                         July 04, 2022 12:21 PM  Additional Documentation   All immunohistochemistry, in situ hybridization tests and special stains performed at [facility] and reported herein were developed, validated and their performance characteristics determined by the [facility]. During the performance of these tests, appropriate positive and negative control slides are also performed and reviewed. All control slides and internal controls (when applicable) demonstrate the expected immunoreactive patterns and/or nucleic acid hybridization. These ancillary studies were deemed medically necessary by the requesting pathologist. They were ordered following review of the H&E and clinical history except when part of a liver/kidney protocol or where clinical history (e.g. immunocompromised, critically ill, history of malignancy) clearly indicates. Some of the tests may not be cleared or approved by the U.S. Food and Drug Administration (FDA).  The FDA has  determined that such clearance or approval is not necessary.  These tests are used for clinical purposes and should not be regarded as investigational or as research.  This laboratory is certified under the Clinical Laboratory Improvement Amendments of 1988 (CLIA) as qualified to perform high complexity clinical testing. At least one block in this case underwent decalcification during its processing. The vast majority of immunohistochemical and hybridization assays were not validated on decalcified tissues. The results were interpreted with caution given the possibility of false negative results on decalcified specimens.  Attestation   All of the diagnostic evaluations on the enumerated specimens have been personally conducted by the pathologists involved in the care of this patient as indicated by the electronic signatures above.  Resulting Agency [facility] SURGICAL PATHOLOGY AND CYTOPATHOLOGY   Specimen Collected: 07/04/22 10:15   Performed by: [facility] SURGICAL PATHOLOGY AND CYTOPATHOLOGY Last Resulted: 07/10/22 12:25  Received From: [facility]  Result Received: 07/14/22 09:58   View Encounter      Received Information Pathology - Bone Marrow (Order [id number])    suggestion  Information displayed in this report may not trend or trigger automated decision support.    Pathology - Bone Marrow Order: [id number] Component 4 wk ago  Case Report Surgical Pathology                                Case: [id number]                               Authorizing Provider:  Cheyenne Anette Dragon, NP    Collected:           07/04/2022 1015             Ordering Location:     [facility] and   Received:            07/04/2022 1048  [facility]                                                   Pathologist:           Elva Sheree Feast, MD                                                   Specimens:   A) - Bone Marrow, Aspirate                                                                         B) - Bone Marrow, Biopsy                                                                          C) - Bone Marrow, Clot                                                                DIAGNOSIS   A-C. Peripheral blood and bone marrow (peripheral smear, aspirate, touch preparation, core biopsy, and clot section): - Low-level plasma cell neoplasm, see comment.   COMMENT: The core biopsy is 40% cellular. Cyclin-D1 immunohistochemical stain demonstrates very rare positive plasma cells (1%). Concurrent flow cytometry study ([id number]) shows a plasma cell population with an atypical immunophenotype, comprising approximately 0.04% of analyzed events.    Reviewed by resident/fellow SINA RUSH  Electronically signed by Elva Sheree Feast, MD on 07/10/2022 at 12:25 PM  Clinical Information   68 year old female with history of plasma cell myeloma.  Gross Examination   A. Peripheral blood and bone marrow labeled with patient's name and medical record number, one peripheral blood smear, three bone marrow aspirate smears and one bone marrow touch preparation are received.   B. Bone marrow biopsy, in [facility].  Core biopsies of red-brown tissue 1.4 x 0.2 cm in aggregate are submitted in total as block B1 for decalcification.   C. Bone marrow clot, in [facility].  A 1.8 x 1.8 x 0.3 cm aggregate of red-brown clot is submitted in total as block C1.    J.Elam/ Dr. Juanita     RADIOGRAPHIC STUDIES: I have personally reviewed the radiological images as listed and agreed with the findings in the report. DG HIP UNILAT WITH PELVIS 2-3 VIEWS LEFT Result Date: 03/16/2024 CLINICAL DATA:  Left hip pain. No known injury. History of multiple myeloma. EXAM: DG HIP (WITH OR WITHOUT PELVIS) 2-3V LEFT COMPARISON:  08/10/2017  FINDINGS: There is no evidence of hip fracture or dislocation. No focal lytic lesion is seen radiographically as compared with prior exams. There is no  evidence of arthropathy or other focal bone abnormality. Degenerative changes are present in the lower lumbar spine. IMPRESSION: No acute fracture or dislocation. Electronically Signed   By: Leita Birmingham M.D.   On: 03/16/2024 15:38    ASSESSMENT & PLAN:  Paula Johnson is a 68 y.o. female who presents for continued management of multiple myeloma.   #IgA lambda R-ISS stage II multiple myeloma with innumerable lytic lesions in the calvarium, lesion in T7 vertebra and multiple lesions in the pelvis.  --Diagnosed in January 2017. --Status post Vd x 1 cycle, VRd x 6 cycles --Underwent HD Melphalan 200mg /m2 on 03/22/2016 --Undewent Autologous HSCT on 03/23/2016 with Dr Oscar at [facility] (Dose of 6.4 x10^6/kg) --Underwent bone marrow biopsy on 10/07/18 cytometry indicate that the pt continues to be in CR --10/10/2019 Flow Cytometry revealed A. Bone Marrow (Plasma Cell Myeloma Minimal Residual Disease Detection by Flow Cytometry): Negative. No phenotypically abnormal plasma cells at or above the limit of detection identified.  PLAN:  -Labs from today were reviewed. WBC 3.6, Hgb 12.9, MCV 92.4, Plt 250. Creatinine, Calcium  and LFTs in range.  -Proceed with daratumumab  infusion as planned without any dose modifications. Continue on Revlimid  without any dose modifications.  -Continue acyclovir  for VZV prophylaxis -Continue aspirin  for VTE prophylaxis. -Monitor hip pain and follow up with MRI imaging if pain worsens.   FOLLOW UP: Per integrated scheduling  All of the patient's questions were answered with apparent satisfaction. The patient knows to call the clinic with any problems, questions or concerns.  I have spent a total of 30 minutes minutes of face-to-face and non-face-to-face time, preparing to see the patient, performing a medically appropriate examination, counseling and educating the patient,  documenting clinical information in the electronic health record,  and care coordination.    Johnston Police PA-C Dept of Hematology and Oncology [facility] Phone: [phone]

## 2024-03-19 NOTE — Patient Instructions (Signed)

## 2024-03-21 ENCOUNTER — Telehealth: Payer: Self-pay | Admitting: Physician Assistant

## 2024-03-21 LAB — MULTIPLE MYELOMA PANEL, SERUM
Albumin SerPl Elph-Mcnc: 3.6 g/dL (ref 2.9–4.4)
Albumin/Glob SerPl: 1.4 (ref 0.7–1.7)
Alpha 1: 0.3 g/dL (ref 0.0–0.4)
Alpha2 Glob SerPl Elph-Mcnc: 0.9 g/dL (ref 0.4–1.0)
B-Globulin SerPl Elph-Mcnc: 1 g/dL (ref 0.7–1.3)
Gamma Glob SerPl Elph-Mcnc: 0.4 g/dL (ref 0.4–1.8)
Globulin, Total: 2.6 g/dL (ref 2.2–3.9)
IgA: 107 mg/dL (ref 87–352)
IgG (Immunoglobin G), Serum: 590 mg/dL (ref 586–1602)
IgM (Immunoglobulin M), Srm: 22 mg/dL — ABNORMAL LOW (ref 26–217)
Total Protein ELP: 6.2 g/dL (ref 6.0–8.5)

## 2024-03-21 NOTE — Telephone Encounter (Signed)
 Scheduled appointments per WQ. Talked with the patient and she is aware of the made appointments.

## 2024-04-08 ENCOUNTER — Other Ambulatory Visit: Payer: Self-pay | Admitting: Hematology

## 2024-04-08 DIAGNOSIS — C9001 Multiple myeloma in remission: Secondary | ICD-10-CM

## 2024-04-09 ENCOUNTER — Encounter: Payer: Self-pay | Admitting: Hematology

## 2024-04-09 ENCOUNTER — Other Ambulatory Visit: Payer: Self-pay

## 2024-04-09 DIAGNOSIS — C9001 Multiple myeloma in remission: Secondary | ICD-10-CM

## 2024-04-09 MED ORDER — LENALIDOMIDE 10 MG PO CAPS: ORAL_CAPSULE | ORAL | 0 refills | Status: DC

## 2024-04-16 ENCOUNTER — Ambulatory Visit: Admitting: Hematology

## 2024-04-16 ENCOUNTER — Ambulatory Visit

## 2024-04-16 ENCOUNTER — Other Ambulatory Visit

## 2024-04-18 ENCOUNTER — Other Ambulatory Visit: Payer: Self-pay

## 2024-04-18 ENCOUNTER — Inpatient Hospital Stay

## 2024-04-18 ENCOUNTER — Inpatient Hospital Stay: Attending: Hematology

## 2024-04-18 VITALS — BP 118/78 | HR 82 | Temp 98.2°F | Resp 18 | Wt 132.0 lb

## 2024-04-18 DIAGNOSIS — C9000 Multiple myeloma not having achieved remission: Secondary | ICD-10-CM | POA: Diagnosis present

## 2024-04-18 DIAGNOSIS — Z5112 Encounter for antineoplastic immunotherapy: Secondary | ICD-10-CM | POA: Diagnosis present

## 2024-04-18 DIAGNOSIS — C9001 Multiple myeloma in remission: Secondary | ICD-10-CM

## 2024-04-18 DIAGNOSIS — Z79899 Other long term (current) drug therapy: Secondary | ICD-10-CM | POA: Insufficient documentation

## 2024-04-18 LAB — CMP (CANCER CENTER ONLY)
ALT: 23 U/L (ref 0–44)
AST: 18 U/L (ref 15–41)
Albumin: 3.9 g/dL (ref 3.5–5.0)
Alkaline Phosphatase: 84 U/L (ref 38–126)
Anion gap: 4 — ABNORMAL LOW (ref 5–15)
BUN: 17 mg/dL (ref 8–23)
CO2: 28 mmol/L (ref 22–32)
Calcium: 9.1 mg/dL (ref 8.9–10.3)
Chloride: 108 mmol/L (ref 98–111)
Creatinine: 0.82 mg/dL (ref 0.44–1.00)
GFR, Estimated: >60 mL/min (ref >60)
Glucose, Bld: 89 mg/dL (ref 70–99)
Potassium: 4.1 mmol/L (ref 3.5–5.1)
Sodium: 140 mmol/L (ref 135–145)
Total Bilirubin: 0.5 mg/dL (ref 0.0–1.2)
Total Protein: 6.6 g/dL (ref 6.5–8.1)

## 2024-04-18 LAB — CBC WITH DIFFERENTIAL (CANCER CENTER ONLY)
Abs Immature Granulocytes: 0.01 K/uL (ref 0.00–0.07)
Basophils Absolute: 0 K/uL (ref 0.0–0.1)
Basophils Relative: 1 %
Eosinophils Absolute: 0.1 K/uL (ref 0.0–0.5)
Eosinophils Relative: 4 %
HCT: 37.6 % (ref 36.0–46.0)
Hemoglobin: 12.7 g/dL (ref 12.0–15.0)
Immature Granulocytes: 0 %
Lymphocytes Relative: 20 %
Lymphs Abs: 0.7 K/uL (ref 0.7–4.0)
MCH: 31.8 pg (ref 26.0–34.0)
MCHC: 33.8 g/dL (ref 30.0–36.0)
MCV: 94 fL (ref 80.0–100.0)
Monocytes Absolute: 0.5 K/uL (ref 0.1–1.0)
Monocytes Relative: 14 %
Neutro Abs: 2.1 K/uL (ref 1.7–7.7)
Neutrophils Relative %: 61 %
Platelet Count: 360 K/uL (ref 150–400)
RBC: 4 MIL/uL (ref 3.87–5.11)
RDW: 13.9 % (ref 11.5–15.5)
WBC Count: 3.4 K/uL — ABNORMAL LOW (ref 4.0–10.5)
nRBC: 0 % (ref 0.0–0.2)

## 2024-04-18 MED ORDER — ACETAMINOPHEN 325 MG PO TABS
650.0000 mg | ORAL_TABLET | Freq: Once | ORAL | Status: AC
  Administered 2024-04-18: 650 mg via ORAL

## 2024-04-18 MED ORDER — FAMOTIDINE 20 MG PO TABS
20.0000 mg | ORAL_TABLET | Freq: Once | ORAL | Status: AC
  Administered 2024-04-18: 20 mg via ORAL

## 2024-04-18 MED ORDER — DARATUMUMAB-HYALURONIDASE-FIHJ 1800-30000 MG-UT/15ML ~~LOC~~ SOLN
1800.0000 mg | Freq: Once | SUBCUTANEOUS | Status: AC
  Administered 2024-04-18: 1800 mg via SUBCUTANEOUS
  Filled 2024-04-18: qty 15

## 2024-04-18 MED ORDER — DIPHENHYDRAMINE HCL 25 MG PO CAPS
25.0000 mg | ORAL_CAPSULE | Freq: Once | ORAL | Status: AC
  Administered 2024-04-18: 25 mg via ORAL

## 2024-04-18 NOTE — Progress Notes (Signed)
 [facility] Clinic Note - Initial Visit   Date: 04/21/2024   Paula Johnson MRN: [id number] DOB: Mar 10, 1956   Dear Dr. Onesimo:  Thank you for your kind referral of Paula Johnson for consultation of numbness in the feet. Although her history is well known to you, please allow us  to reiterate it for the purpose of our medical record. The patient was accompanied to the clinic by self    Paula Johnson is a 68 y.o. right-handed female with multiple myeloma (2017), migraines, and GERD presenting for evaluation of numbness/tingling.   IMPRESSION/PLAN: Distal and symmetric neuropathy affecting the feet, manifesting with numbness, in the setting of multiple myeloma.  Her neurological examination shows a distal predominant large fiber peripheral neuropathy.  Her symptoms predated when she started chemotherapy and therefore, this is unlikely to cause her neuropathy and she denies symptoms significantly worsening when on chemotherapy with Velcade .  Multiple myeloma can certainly cause neuropathy, however, as the disease is better controlled symptoms tend to stabilize.  With her symptoms progressing, it is most likely that she have overlapping degenerative neuropathy.  I discussed that in the vast majority of cases, despite checking for reversible causes, we are unable to find the underlying etiology and management is symptomatic.  Unfortunately, there is no treatment for numbness or medication to prevent progression.  I will check vitamin B12, folate to be complete.  She is not diabetic or have history of heavy alcohol use. Nerve testing of the legs discussed, opted not to proceed. Patient educated on daily foot inspection, fall prevention, and safety precautions around the home.  If symptoms significantly worsen or change, she will return to see me.     ------------------------------------------------------------- History of present illness: Starting around 2016, she began  having numbness involving the balls of her feet, which she initially attributed to dancing.  Over the years, the numbness has extended into the soles and toes.  Symptoms are constant with no exacerbating or alleviating factors.  There is no associated pain.  Both feet are involved equally.  She denies imbalance or weakness.  No falls.  She has no personal history of diabetes and heavy alcohol use.  Her mother has neuropathy.  She denies numbness/tingling of the hands.   In 2017, she was diagnosed with IgA lambda R-ISS stage II multiple myeloma with innumerable lytic lesions in the calvarium, lesion in T7 vertebra and multiple lesions in the pelvis. S/p stem cell transplant at [facility] (2017).  She is in remission and on Revlimid  and daratumumab .  She was on chemotherapy with Velcade  initially, but does not recall that her numbness intensified during this time.   Out-side paper records, electronic medical record, and images have been reviewed where available and summarized as:  Lab Results  Component Value Date   HGBA1C 5.6 08/09/2017   Lab Results  Component Value Date   VITAMINB12 216 07/06/2021   Lab Results  Component Value Date   TSH 2.90 07/06/2021   No results found for: ELIGIO SANDHOFF  Past Medical History:  Diagnosis Date   Abnormal Pap smear of vagina 03/22/2017   ASCUS pap and negative HR HPV.  Patient is on immunosuppressive medication.    Allergic reaction to bee sting    Anemia    AR (aortic regurgitation) 11/30/2017   trace noted on ECHO   Atrial septal aneurysm per 11-30-17 echo   trivial pericardial effusion   Complicated migraine    Constipation    DDD (degenerative disc  disease), cervical    DDD (degenerative disc disease), lumbosacral    Fatty liver    Female proctocele without uterine prolapse    Fibroid    reason for Hysterectomy   FTT (failure to thrive) in adult 08/09/2017   Gastroesophageal reflux disease without esophagitis 01/17/2017    Generalized anxiety disorder    Grade I diastolic dysfunction    Hammer toe 05/13/2018   Hematuria 11/08/2015   History of COVID-19 12/2020   History of stress incontinence    Hypergammaglobulinemia    Hyperlipidemia    Hypoxemia 12/16/2007   Insomnia    Leg length discrepancy    Loss of appetite    Lung nodule    Malignant neoplasm of thymus (HCC) 12/03/2007   Metastatic multiple myeloma to bone (HCC) 10/19/2015   Metatarsalgia of right foot 07/15/2018   MR (mitral regurgitation) 11/30/2017   trace noted on ECHO   Muscular fasciculation    Nasal septal deviation    Nasal turbinate hypertrophy    Near syncope    Obstructive sleep apnea 12/03/2007   Other fatigue    Peripheral edema    Plasma cell dyscrasia    PONV (postoperative nausea and vomiting)    thinks morphine  caused nausea   Postoperative urinary retention 05/14/2018   Prediabetes    Pure hypercholesterolemia    Rash 11/01/2015   Restless leg syndrome    S/P autologous bone marrow transplantation (HCC) 03/29/2016   Shortness of breath 12/04/2007   TR (tricuspid regurgitation) 11/30/2017   trace noted on ECHO   Urinary incontinence    UTI (urinary tract infection)    Vaginal granulation tissue 12/03/2017   Vitamin D  deficiency    Wears glasses     Past Surgical History:  Procedure Laterality Date   ABDOMINAL HYSTERECTOMY  1997   TAH--ovaries remain--Dr. Debby Lares   ANTERIOR AND POSTERIOR REPAIR N/A 05/14/2018   Procedure: ANTERIOR (CYSTOCELE) AND POSTERIOR REPAIR (RECTOCELE);  Surgeon: Cathlyn JAYSON Nikki Bobie FORBES, MD;  Location: [facility];  Service: Gynecology;  Laterality: N/A;   BLADDER SUSPENSION N/A 05/14/2018   Procedure: TRANSVAGINAL TAPE (TVT) PROCEDURE exact midurethral sling;  Surgeon: Cathlyn JAYSON Nikki Bobie FORBES, MD;  Location: [facility];  Service: Gynecology;  Laterality: N/A;   BONE MARROW TRANSPLANT  2017   done at [facility]   BREAST BIOPSY     COLONOSCOPY     CYSTOSCOPY N/A 05/14/2018   Procedure:  CYSTOSCOPY;  Surgeon: Cathlyn JAYSON Nikki Bobie FORBES, MD;  Location: [facility];  Service: Gynecology;  Laterality: N/A;   CYSTOSCOPY N/A 05/30/2018   Procedure: CYSTOSCOPY;  Surgeon: Cathlyn JAYSON Nikki Bobie FORBES, MD;  Location: [facility];  Service: Gynecology;  Laterality: N/A;   ROBOTIC ASSISTED LAPAROSCOPIC SACROCOLPOPEXY N/A 05/14/2018   Procedure: XI ROBOTIC ASSISTED LAPAROSCOPIC SACROCOLPOPEXY;  Surgeon: Cathlyn JAYSON Nikki Bobie FORBES, MD;  Location: [facility];  Service: Gynecology;  Laterality: N/A;  6 hours OR time. Need extended stay recovery bed.   SALPINGOOPHORECTOMY Bilateral 05/14/2018   Procedure: SALPINGO OOPHORECTOMY;  Surgeon: Cathlyn JAYSON Nikki Bobie FORBES, MD;  Location: [facility];  Service: Gynecology;  Laterality: Bilateral;   THYMECTOMY  1999   TRANSVAGINAL TAPE (TVT) REMOVAL N/A 05/30/2018   Procedure: TRANSVAGINAL TAPE (TVT)  Revision;  Surgeon: Cathlyn JAYSON Nikki Bobie FORBES, MD;  Location: [facility];  Service: Gynecology;  Laterality: N/A;   WISDOM TOOTH EXTRACTION       Medications:  Outpatient Encounter Medications as of 04/21/2024  Medication Sig  acyclovir  (ZOVIRAX ) 400 MG tablet Take 1 tablet by mouth twice daily   Ascorbic Acid (VITAMIN C) 1000 MG tablet Take 1,000 mg by mouth 2 (two) times daily.   aspirin  EC 81 MG tablet Take 81 mg by mouth daily.   b complex vitamins capsule Take 1 capsule by mouth daily.   Cholecalciferol  (VITAMIN D3) 30 MCG/15ML LIQD Take by mouth. Pt. Takes 5000 IU daily   Cyanocobalamin (B-12) 1000 MCG CAPS    EPINEPHrine  0.3 mg/0.3 mL IJ SOAJ injection INJECT INTRAMUSCULARLLY AS DIRECTED AS NEEDED FOR ANAPHYLAXIS   estradiol  (ESTRACE ) 0.1 MG/GM vaginal cream INSERT  1 GRAM VAGINALLY TWICE A WEEK AT BEDTIME   Iron, Ferrous Sulfate, 325 (65 Fe) MG TABS Take 325 mg by mouth daily.   lenalidomide  (REVLIMID ) 10 MG capsule TAKE ONE CAPSULE BY MOUTH DAILY FOR 21 DAYS, THEN 7 DAYS OFF   lidocaine  (LIDODERM ) 5 % Place 1 patch onto the skin daily.  Remove & Discard patch within 12 hours or as directed by MD   Magnesium  400 MG CAPS Take by mouth.   Multiple Vitamins-Minerals (PRESERVISION AREDS PO) Take 1 capsule by mouth 2 (two) times daily.   LORazepam  (ATIVAN ) 0.5 MG tablet Take 1 tablet (0.5 mg total) by mouth at bedtime as needed for sleep or anxiety. (Patient not taking: Reported on 04/21/2024)   methocarbamol  (ROBAXIN ) 500 MG tablet Take 1 tablet (500 mg total) by mouth 2 (two) times daily as needed for muscle spasms. (Patient not taking: Reported on 04/21/2024)   tiZANidine (ZANAFLEX) 4 MG tablet Take by mouth. (Patient not taking: Reported on 04/21/2024)   No facility-administered encounter medications on file as of 04/21/2024.    Allergies:  Allergies  Allergen Reactions   Ampicillin Rash and Other (See Comments)   Penicillins Rash and Other (See Comments)    Has patient had a PCN reaction causing immediate rash, facial/tongue/throat swelling, SOB or lightheadedness with hypotension: Y Has patient had a PCN reaction causing severe rash involving mucus membranes or skin necrosis: Y Has patient had a PCN reaction that required hospitalization: N Has patient had a PCN reaction occurring within the last 10 years: N If all of the above answers are NO, then may proceed with Cephalosporin use.     Family History: Family History  Problem Relation Age of Onset   Hyperlipidemia Mother    Memory loss Mother        47s; believed to be age-related memory decline   COPD Father        dec age 31/s-smoked   Macular degeneration Father    Behavior problems Father        at the end of life   Diabetes Brother    Macular degeneration Brother    Suicidality Brother    Diabetes Maternal Grandmother    Heart attack Paternal Grandfather     Social History: Social History   Tobacco Use   Smoking status: Never   Smokeless tobacco: Never  Vaping Use   Vaping status: Never Used  Substance Use Topics   Alcohol use: Not Currently     Alcohol/week: 0.0 standard drinks of alcohol   Drug use: Never   Social History   Social History Narrative   Lives with husband   Caffeine- coffee, 2 cups daily   Right handed      Are you right handed or left handed? Right Handed   Are you currently employed ? No    What is your current occupation?  Do you live at home alone? No    Who lives with you? Husband    What type of home do you live in: 1 story or 2 story? Lives in a one story home with a basement.         Vital Signs:  BP 121/74   Pulse 77   Ht 5' 7.75 (1.721 m)   Wt 129 lb (58.5 kg)   LMP 09/26/1995 (Within Months)   SpO2 96%   BMI 19.76 kg/m    Neurological Exam: MENTAL STATUS including orientation to time, place, person, recent and remote memory, attention span and concentration, language, and fund of knowledge is normal.  Speech is not dysarthric.  CRANIAL NERVES: II:  No visual field defects.     III-IV-VI: Pupils equal round and reactive to light.  Normal conjugate, extra-ocular eye movements in all directions of gaze.  No nystagmus.  No ptosis.   V:  Normal facial sensation.    VII:  Normal facial symmetry and movements.   VIII:  Normal hearing and vestibular function.   IX-X:  Normal palatal movement.   XI:  Normal shoulder shrug and head rotation.   XII:  Normal tongue strength and range of motion, no deviation or fasciculation.  MOTOR:  No atrophy, fasciculations or abnormal movements.  No pronator drift.   Upper Extremity:  Right  Left  Deltoid  5/5   5/5   Biceps  5/5   5/5   Triceps  5/5   5/5   Wrist extensors  5/5   5/5   Wrist flexors  5/5   5/5   Finger extensors  5/5   5/5   Finger flexors  5/5   5/5   Dorsal interossei  5/5   5/5   Abductor pollicis  5/5   5/5   Tone (Ashworth scale)  0  0   Lower Extremity:  Right  Left  Hip flexors  5/5   5/5   Knee flexors  5/5   5/5   Knee extensors  5/5   5/5   Dorsiflexors  5/5   5/5   Plantarflexors  5/5   5/5   Toe extensors  5/5    5/5   Toe flexors  5/5   5/5   Tone (Ashworth scale)  0  0   MSRs:                                           Right        Left brachioradialis 2+  2+  biceps 2+  2+  triceps 2+  2+  patellar 2+  2+  ankle jerk 1+  1+  Hoffman no  no  plantar response down  down   SENSORY:  Absent temperature and vibration below the ankles bilaterally.  Pin prick intact throughout.   Romberg's sign is positive.   COORDINATION/GAIT: Normal finger-to- nose-finger.  Intact rapid alternating movements bilaterally.   Gait narrow based and stable. Mild unsteadiness wit tandem gait, but able to perform.  Stressed gait intact.   Thank you for allowing me to participate in patient's care.  If I can answer any additional questions, I would be pleased to do so.    Sincerely,    Cailee Blanke K. Tobie, DO

## 2024-04-18 NOTE — Patient Instructions (Signed)

## 2024-04-21 ENCOUNTER — Other Ambulatory Visit

## 2024-04-21 ENCOUNTER — Encounter: Payer: Self-pay | Admitting: Neurology

## 2024-04-21 ENCOUNTER — Ambulatory Visit (INDEPENDENT_AMBULATORY_CARE_PROVIDER_SITE_OTHER): Admitting: Neurology

## 2024-04-21 VITALS — BP 121/74 | HR 77 | Ht 67.75 in | Wt 129.0 lb

## 2024-04-21 DIAGNOSIS — G63 Polyneuropathy in diseases classified elsewhere: Secondary | ICD-10-CM

## 2024-04-21 DIAGNOSIS — C9000 Multiple myeloma not having achieved remission: Secondary | ICD-10-CM | POA: Diagnosis not present

## 2024-04-21 DIAGNOSIS — G629 Polyneuropathy, unspecified: Secondary | ICD-10-CM | POA: Diagnosis not present

## 2024-04-21 LAB — MULTIPLE MYELOMA PANEL, SERUM
Albumin SerPl Elph-Mcnc: 3.3 g/dL (ref 2.9–4.4)
Albumin/Glob SerPl: 1.3 (ref 0.7–1.7)
Alpha 1: 0.3 g/dL (ref 0.0–0.4)
Alpha2 Glob SerPl Elph-Mcnc: 0.9 g/dL (ref 0.4–1.0)
B-Globulin SerPl Elph-Mcnc: 1 g/dL (ref 0.7–1.3)
Gamma Glob SerPl Elph-Mcnc: 0.5 g/dL (ref 0.4–1.8)
Globulin, Total: 2.6 g/dL (ref 2.2–3.9)
IgA: 115 mg/dL (ref 87–352)
IgG (Immunoglobin G), Serum: 592 mg/dL (ref 586–1602)
IgM (Immunoglobulin M), Srm: 20 mg/dL — ABNORMAL LOW (ref 26–217)
Total Protein ELP: 5.9 g/dL — ABNORMAL LOW (ref 6.0–8.5)

## 2024-04-21 LAB — B12 AND FOLATE PANEL
Folate: 19.1 ng/mL
Vitamin B-12: 1373 pg/mL — ABNORMAL HIGH (ref 200–1100)

## 2024-04-21 NOTE — Patient Instructions (Addendum)
 Check labs  Cheek your feet daily  Take extra caution on uneven ground

## 2024-04-22 ENCOUNTER — Ambulatory Visit: Payer: Self-pay | Admitting: Neurology

## 2024-05-08 ENCOUNTER — Other Ambulatory Visit: Payer: Self-pay

## 2024-05-08 DIAGNOSIS — C9001 Multiple myeloma in remission: Secondary | ICD-10-CM

## 2024-05-08 MED ORDER — LENALIDOMIDE 10 MG PO CAPS: ORAL_CAPSULE | ORAL | 0 refills | Status: DC

## 2024-05-09 ENCOUNTER — Other Ambulatory Visit: Payer: Self-pay | Admitting: Hematology

## 2024-05-09 DIAGNOSIS — C9001 Multiple myeloma in remission: Secondary | ICD-10-CM

## 2024-05-12 ENCOUNTER — Encounter: Payer: Self-pay | Admitting: Hematology

## 2024-05-14 ENCOUNTER — Inpatient Hospital Stay: Admitting: Hematology

## 2024-05-14 ENCOUNTER — Inpatient Hospital Stay: Attending: Hematology

## 2024-05-14 ENCOUNTER — Inpatient Hospital Stay

## 2024-05-21 ENCOUNTER — Other Ambulatory Visit: Payer: Self-pay

## 2024-05-21 ENCOUNTER — Inpatient Hospital Stay: Attending: Hematology

## 2024-05-21 ENCOUNTER — Inpatient Hospital Stay

## 2024-05-21 ENCOUNTER — Inpatient Hospital Stay (HOSPITAL_BASED_OUTPATIENT_CLINIC_OR_DEPARTMENT_OTHER): Admitting: Hematology

## 2024-05-21 ENCOUNTER — Other Ambulatory Visit: Payer: Self-pay | Admitting: *Deleted

## 2024-05-21 VITALS — BP 118/72 | HR 80 | Temp 98.2°F | Resp 18

## 2024-05-21 DIAGNOSIS — C9000 Multiple myeloma not having achieved remission: Secondary | ICD-10-CM | POA: Insufficient documentation

## 2024-05-21 DIAGNOSIS — Z79899 Other long term (current) drug therapy: Secondary | ICD-10-CM | POA: Insufficient documentation

## 2024-05-21 DIAGNOSIS — Z7961 Long term (current) use of immunomodulator: Secondary | ICD-10-CM | POA: Insufficient documentation

## 2024-05-21 DIAGNOSIS — Z5112 Encounter for antineoplastic immunotherapy: Secondary | ICD-10-CM | POA: Diagnosis present

## 2024-05-21 DIAGNOSIS — C9001 Multiple myeloma in remission: Secondary | ICD-10-CM

## 2024-05-21 LAB — CBC WITH DIFFERENTIAL (CANCER CENTER ONLY)
Abs Immature Granulocytes: 0.01 K/uL (ref 0.00–0.07)
Basophils Absolute: 0.1 K/uL (ref 0.0–0.1)
Basophils Relative: 2 %
Eosinophils Absolute: 0.1 K/uL (ref 0.0–0.5)
Eosinophils Relative: 3 %
HCT: 42 % (ref 36.0–46.0)
Hemoglobin: 14.1 g/dL (ref 12.0–15.0)
Immature Granulocytes: 0 %
Lymphocytes Relative: 18 %
Lymphs Abs: 0.7 K/uL (ref 0.7–4.0)
MCH: 31.3 pg (ref 26.0–34.0)
MCHC: 33.6 g/dL (ref 30.0–36.0)
MCV: 93.3 fL (ref 80.0–100.0)
Monocytes Absolute: 0.5 K/uL (ref 0.1–1.0)
Monocytes Relative: 13 %
Neutro Abs: 2.6 K/uL (ref 1.7–7.7)
Neutrophils Relative %: 64 %
Platelet Count: 345 K/uL (ref 150–400)
RBC: 4.5 MIL/uL (ref 3.87–5.11)
RDW: 14.3 % (ref 11.5–15.5)
WBC Count: 4 K/uL (ref 4.0–10.5)
nRBC: 0 % (ref 0.0–0.2)

## 2024-05-21 LAB — CMP (CANCER CENTER ONLY)
ALT: 21 U/L (ref 0–44)
AST: 17 U/L (ref 15–41)
Albumin: 4.4 g/dL (ref 3.5–5.0)
Alkaline Phosphatase: 72 U/L (ref 38–126)
Anion gap: 5 (ref 5–15)
BUN: 20 mg/dL (ref 8–23)
CO2: 30 mmol/L (ref 22–32)
Calcium: 10.1 mg/dL (ref 8.9–10.3)
Chloride: 105 mmol/L (ref 98–111)
Creatinine: 0.82 mg/dL (ref 0.44–1.00)
GFR, Estimated: >60 mL/min (ref >60)
Glucose, Bld: 78 mg/dL (ref 70–99)
Potassium: 4.4 mmol/L (ref 3.5–5.1)
Sodium: 140 mmol/L (ref 135–145)
Total Bilirubin: 0.7 mg/dL (ref 0.0–1.2)
Total Protein: 7 g/dL (ref 6.5–8.1)

## 2024-05-21 MED ORDER — FAMOTIDINE 20 MG PO TABS
20.0000 mg | ORAL_TABLET | Freq: Once | ORAL | Status: AC
  Administered 2024-05-21: 20 mg via ORAL
  Filled 2024-05-21: qty 1

## 2024-05-21 MED ORDER — ACETAMINOPHEN 325 MG PO TABS
650.0000 mg | ORAL_TABLET | Freq: Once | ORAL | Status: AC
  Administered 2024-05-21: 650 mg via ORAL
  Filled 2024-05-21: qty 2

## 2024-05-21 MED ORDER — DIPHENHYDRAMINE HCL 25 MG PO CAPS
25.0000 mg | ORAL_CAPSULE | Freq: Once | ORAL | Status: AC
  Administered 2024-05-21: 25 mg via ORAL
  Filled 2024-05-21: qty 1

## 2024-05-21 MED ORDER — DARATUMUMAB-HYALURONIDASE-FIHJ 1800-30000 MG-UT/15ML ~~LOC~~ SOLN
1800.0000 mg | Freq: Once | SUBCUTANEOUS | Status: AC
  Administered 2024-05-21: 1800 mg via SUBCUTANEOUS
  Filled 2024-05-21: qty 15

## 2024-05-21 NOTE — Patient Instructions (Signed)

## 2024-05-27 ENCOUNTER — Encounter: Payer: Self-pay | Admitting: Hematology

## 2024-05-27 LAB — MULTIPLE MYELOMA PANEL, SERUM
Albumin SerPl Elph-Mcnc: 3.7 g/dL (ref 2.9–4.4)
Albumin/Glob SerPl: 1.4 (ref 0.7–1.7)
Alpha 1: 0.3 g/dL (ref 0.0–0.4)
Alpha2 Glob SerPl Elph-Mcnc: 0.8 g/dL (ref 0.4–1.0)
B-Globulin SerPl Elph-Mcnc: 1.1 g/dL (ref 0.7–1.3)
Gamma Glob SerPl Elph-Mcnc: 0.5 g/dL (ref 0.4–1.8)
Globulin, Total: 2.7 g/dL (ref 2.2–3.9)
IgA: 115 mg/dL (ref 87–352)
IgG (Immunoglobin G), Serum: 627 mg/dL (ref 586–1602)
IgM (Immunoglobulin M), Srm: 22 mg/dL — ABNORMAL LOW (ref 26–217)
Total Protein ELP: 6.4 g/dL (ref 6.0–8.5)

## 2024-05-27 NOTE — Progress Notes (Signed)
 HEMATOLOGY/ONCOLOGY CLINIC NOTE  Date of Service: .05/21/2024   PCP: Seabron Lenis, MD Transplant Oncologist -- Dr Lela Pound MD  CHIEF COMPLAINT: Follow-up for continued valuation and management of multiple myeloma  DIAGNOSIS: IgA lambda R-ISS stage II multiple myeloma with innumerable lytic lesions in the calvarium, lesion in T7 vertebra and multiple lesions in the pelvis.  Diagnosed in January 2017.   Current treatment: Revlimid  10mg  po daily 3 weeks on 1 week off  Daratumumab  added 11/15 for loss of MRD negative status   Previous Treatment: Status post Vd x 1 cycle VRd x 6 cycles   HD Melphalan 200mg /m2 on 03/22/2016 Autologous HSCT on 03/23/2016 with Dr Oscar at [facility] (Dose of 6.4 x10^6/kg)   INTERVAL HISTORY:   Ms. Zappulla is a 68 y.o. female is here for continued evaluation and management of multiple myeloma. She continues to be on daratumumab  Revlimid  maintenance treatment and is tolerating this well with no new acute toxicities. No new focal bone pains.  No fevers no chills no night sweats.  No new dental issues.. Also follows with Dr. Sid @ [facility] for MRD response monitoring. No infection issues. No other acute new concerns at this time.  Past Medical History:  Diagnosis Date   Abnormal Pap smear of vagina 03/22/2017   ASCUS pap and negative HR HPV.  Patient is on immunosuppressive medication.    Allergic reaction to bee sting    Anemia    AR (aortic regurgitation) 11/30/2017   trace noted on ECHO   Atrial septal aneurysm per 11-30-17 echo   trivial pericardial effusion   Complicated migraine    Constipation    DDD (degenerative disc disease), cervical    DDD (degenerative disc disease), lumbosacral    Fatty liver    Female proctocele without uterine prolapse    Fibroid    reason for Hysterectomy   FTT (failure to thrive) in adult 08/09/2017   Gastroesophageal reflux disease without esophagitis 01/17/2017   Generalized anxiety disorder     Grade I diastolic dysfunction    Hammer toe 05/13/2018   Hematuria 11/08/2015   History of COVID-19 12/2020   History of stress incontinence    Hypergammaglobulinemia    Hyperlipidemia    Hypoxemia 12/16/2007   Insomnia    Leg length discrepancy    Loss of appetite    Lung nodule    Malignant neoplasm of thymus (HCC) 12/03/2007   Metastatic multiple myeloma to bone (HCC) 10/19/2015   Metatarsalgia of right foot 07/15/2018   MR (mitral regurgitation) 11/30/2017   trace noted on ECHO   Muscular fasciculation    Nasal septal deviation    Nasal turbinate hypertrophy    Near syncope    Obstructive sleep apnea 12/03/2007   Other fatigue    Peripheral edema    Plasma cell dyscrasia    PONV (postoperative nausea and vomiting)    thinks morphine  caused nausea   Postoperative urinary retention 05/14/2018   Prediabetes    Pure hypercholesterolemia    Rash 11/01/2015   Restless leg syndrome    S/P autologous bone marrow transplantation (HCC) 03/29/2016   Shortness of breath 12/04/2007   TR (tricuspid regurgitation) 11/30/2017   trace noted on ECHO   Urinary incontinence    UTI (urinary tract infection)    Vaginal granulation tissue 12/03/2017   Vitamin D  deficiency    Wears glasses    Social History   Socioeconomic History   Marital status: Married    Spouse name: Carlin  Number of children: 2   Years of education: 16   Highest education level: Bachelor's degree (e.g., BA, AB, BS)  Occupational History   Occupation: Retired    Comment: Engineer, 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  Tobacco Use   Smoking status: Never   Smokeless tobacco: Never  Vaping Use   Vaping status: Never Used  Substance and Sexual Activity   Alcohol use: Not Currently    Alcohol/week: 0.0 standard drinks of alcohol   Drug use: Never   Sexual activity: Not Currently    Partners: Male    Birth control/protection: Surgical    Comment: TAH--ovaries remain  Other Topics Concern   Not on file  Social  History Narrative   Lives with husband   Caffeine- coffee, 2 cups daily   Right handed      Are you right handed or left handed? Right Handed   Are you currently employed ? No    What is your current occupation?   Do you live at home alone? No    Who lives with you? Husband    What type of home do you live in: 1 story or 2 story? Lives in a one story home with a basement.        Social Drivers of Corporate investment banker Strain: Not on file  Food Insecurity: Not on file  Transportation Needs: Not on file  Physical Activity: Not on file  Stress: Not on file  Social Connections: Not on file   Family History  Problem Relation Age of Onset   Hyperlipidemia Mother    Memory loss Mother        68s; believed to be age-related memory decline   COPD Father        dec age 79/s-smoked   Macular degeneration Father    Behavior problems Father        at the end of life   Diabetes Brother    Macular degeneration Brother    Suicidality Brother    Diabetes Maternal Grandmother    Heart attack Paternal Grandfather    Past Surgical History:  Procedure Laterality Date   ABDOMINAL HYSTERECTOMY  1997   TAH--ovaries remain--Dr. Debby Lares   ANTERIOR AND POSTERIOR REPAIR N/A 05/14/2018   Procedure: ANTERIOR (CYSTOCELE) AND POSTERIOR REPAIR (RECTOCELE);  Surgeon: Cathlyn JAYSON Nikki Bobie FORBES, MD;  Location: [facility];  Service: Gynecology;  Laterality: N/A;   BLADDER SUSPENSION N/A 05/14/2018   Procedure: TRANSVAGINAL TAPE (TVT) PROCEDURE exact midurethral sling;  Surgeon: Cathlyn JAYSON Nikki Bobie FORBES, MD;  Location: [facility];  Service: Gynecology;  Laterality: N/A;   BONE MARROW TRANSPLANT  2017   done at [facility]   BREAST BIOPSY     COLONOSCOPY     CYSTOSCOPY N/A 05/14/2018   Procedure: CYSTOSCOPY;  Surgeon: Cathlyn JAYSON Nikki Bobie FORBES, MD;  Location: [facility];  Service: Gynecology;  Laterality: N/A;   CYSTOSCOPY N/A 05/30/2018   Procedure: CYSTOSCOPY;  Surgeon: Cathlyn JAYSON Nikki Bobie FORBES, MD;  Location:  [facility];  Service: Gynecology;  Laterality: N/A;   ROBOTIC ASSISTED LAPAROSCOPIC SACROCOLPOPEXY N/A 05/14/2018   Procedure: XI ROBOTIC ASSISTED LAPAROSCOPIC SACROCOLPOPEXY;  Surgeon: Cathlyn JAYSON Nikki Bobie FORBES, MD;  Location: [facility];  Service: Gynecology;  Laterality: N/A;  6 hours OR time. Need extended stay recovery bed.   SALPINGOOPHORECTOMY Bilateral 05/14/2018   Procedure: SALPINGO OOPHORECTOMY;  Surgeon: Cathlyn JAYSON Nikki Bobie FORBES, MD;  Location: [facility];  Service: Gynecology;  Laterality: Bilateral;  THYMECTOMY  1999   TRANSVAGINAL TAPE (TVT) REMOVAL N/A 05/30/2018   Procedure: TRANSVAGINAL TAPE (TVT)  Revision;  Surgeon: Cathlyn JAYSON Nikki Bobie FORBES, MD;  Location: [facility];  Service: Gynecology;  Laterality: N/A;   WISDOM TOOTH EXTRACTION      REVIEW OF SYSTEMS:   .10 Point review of Systems was done is negative except as noted above.  PHYSICAL EXAMINATION: VSS . GENERAL:alert, in no acute distress and comfortable SKIN: no acute rashes, no significant lesions EYES: conjunctiva are pink and non-injected, sclera anicteric OROPHARYNX: MMM, no exudates, no oropharyngeal erythema or ulceration NECK: supple, no JVD LYMPH:  no palpable lymphadenopathy in the cervical, axillary or inguinal regions LUNGS: clear to auscultation b/l with normal respiratory effort HEART: regular rate & rhythm ABDOMEN:  normoactive bowel sounds , non tender, not distended. Extremity: no pedal edema PSYCH: alert & oriented x 3 with fluent speech NEURO: no focal motor/sensory deficits   MEDICAL HISTORY:   Past Medical History:  Diagnosis Date   Abnormal Pap smear of vagina 03/22/2017   ASCUS pap and negative HR HPV.  Patient is on immunosuppressive medication.    Allergic reaction to bee sting    Anemia    AR (aortic regurgitation) 11/30/2017   trace noted on ECHO   Atrial septal aneurysm per 11-30-17 echo   trivial pericardial effusion   Complicated migraine     Constipation    DDD (degenerative disc disease), cervical    DDD (degenerative disc disease), lumbosacral    Fatty liver    Female proctocele without uterine prolapse    Fibroid    reason for Hysterectomy   FTT (failure to thrive) in adult 08/09/2017   Gastroesophageal reflux disease without esophagitis 01/17/2017   Generalized anxiety disorder    Grade I diastolic dysfunction    Hammer toe 05/13/2018   Hematuria 11/08/2015   History of COVID-19 12/2020   History of stress incontinence    Hypergammaglobulinemia    Hyperlipidemia    Hypoxemia 12/16/2007   Insomnia    Leg length discrepancy    Loss of appetite    Lung nodule    Malignant neoplasm of thymus (HCC) 12/03/2007   Metastatic multiple myeloma to bone (HCC) 10/19/2015   Metatarsalgia of right foot 07/15/2018   MR (mitral regurgitation) 11/30/2017   trace noted on ECHO   Muscular fasciculation    Nasal septal deviation    Nasal turbinate hypertrophy    Near syncope    Obstructive sleep apnea 12/03/2007   Other fatigue    Peripheral edema    Plasma cell dyscrasia    PONV (postoperative nausea and vomiting)    thinks morphine  caused nausea   Postoperative urinary retention 05/14/2018   Prediabetes    Pure hypercholesterolemia    Rash 11/01/2015   Restless leg syndrome    S/P autologous bone marrow transplantation (HCC) 03/29/2016   Shortness of breath 12/04/2007   TR (tricuspid regurgitation) 11/30/2017   trace noted on ECHO   Urinary incontinence    UTI (urinary tract infection)    Vaginal granulation tissue 12/03/2017   Vitamin D  deficiency    Wears glasses     SURGICAL HISTORY: Past Surgical History:  Procedure Laterality Date   ABDOMINAL HYSTERECTOMY  1997   TAH--ovaries remain--Dr. Debby Lares   ANTERIOR AND POSTERIOR REPAIR N/A 05/14/2018   Procedure: ANTERIOR (CYSTOCELE) AND POSTERIOR REPAIR (RECTOCELE);  Surgeon: Cathlyn JAYSON Nikki Bobie FORBES, MD;  Location: [facility];  Service: Gynecology;   Laterality:  N/A;   BLADDER SUSPENSION N/A 05/14/2018   Procedure: TRANSVAGINAL TAPE (TVT) PROCEDURE exact midurethral sling;  Surgeon: Cathlyn JAYSON Nikki Bobie FORBES, MD;  Location: [facility];  Service: Gynecology;  Laterality: N/A;   BONE MARROW TRANSPLANT  2017   done at [facility]   BREAST BIOPSY     COLONOSCOPY     CYSTOSCOPY N/A 05/14/2018   Procedure: CYSTOSCOPY;  Surgeon: Cathlyn JAYSON Nikki Bobie FORBES, MD;  Location: [facility];  Service: Gynecology;  Laterality: N/A;   CYSTOSCOPY N/A 05/30/2018   Procedure: CYSTOSCOPY;  Surgeon: Cathlyn JAYSON Nikki Bobie FORBES, MD;  Location: [facility];  Service: Gynecology;  Laterality: N/A;   ROBOTIC ASSISTED LAPAROSCOPIC SACROCOLPOPEXY N/A 05/14/2018   Procedure: XI ROBOTIC ASSISTED LAPAROSCOPIC SACROCOLPOPEXY;  Surgeon: Cathlyn JAYSON Nikki Bobie FORBES, MD;  Location: [facility];  Service: Gynecology;  Laterality: N/A;  6 hours OR time. Need extended stay recovery bed.   SALPINGOOPHORECTOMY Bilateral 05/14/2018   Procedure: SALPINGO OOPHORECTOMY;  Surgeon: Cathlyn JAYSON Nikki Bobie FORBES, MD;  Location: [facility];  Service: Gynecology;  Laterality: Bilateral;   THYMECTOMY  1999   TRANSVAGINAL TAPE (TVT) REMOVAL N/A 05/30/2018   Procedure: TRANSVAGINAL TAPE (TVT)  Revision;  Surgeon: Cathlyn JAYSON Nikki Bobie FORBES, MD;  Location: [facility];  Service: Gynecology;  Laterality: N/A;   WISDOM TOOTH EXTRACTION      SOCIAL HISTORY: Social History   Socioeconomic History   Marital status: Married    Spouse name: Carlin   Number of children: 2   Years of education: 16   Highest education level: Bachelor's degree (e.g., BA, AB, BS)  Occupational History   Occupation: Retired    Comment: Engineer, 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 Right Handed   Are you currently employed ? No    What is your current occupation?   Do you live at home alone? No    Who lives with you? Husband    What type of home do you live in: 1 story or 2 story? Lives in a one story home with a basement.        Social Drivers of Corporate investment banker Strain: Not on file  Food Insecurity: Not on file  Transportation Needs: Not on file  Physical Activity: Not on file  Stress: Not on file  Social Connections: Not on file  Intimate Partner Violence: Not on file    FAMILY HISTORY: Family History  Problem Relation Age of Onset   Hyperlipidemia Mother    Memory loss Mother        35s; believed to be age-related memory decline   COPD Father        dec age 18/s-smoked   Macular degeneration Father    Behavior problems Father        at the end of life   Diabetes Brother  Macular degeneration Brother    Suicidality Brother    Diabetes Maternal Grandmother    Heart attack Paternal Grandfather     ALLERGIES:  is allergic to ampicillin and penicillins.  MEDICATIONS:  . Current Outpatient Medications:    acyclovir  (ZOVIRAX ) 400 MG tablet, Take 1 tablet by mouth twice daily, Disp: 60 tablet, Rfl: 0   Ascorbic Acid (VITAMIN C) 1000 MG tablet, Take 1,000 mg by mouth 2 (two) times daily., Disp: , Rfl:    aspirin  EC 81 MG tablet, Take 81 mg by mouth daily., Disp: , Rfl:    b complex vitamins capsule, Take 1 capsule by mouth daily., Disp: , Rfl:    Cholecalciferol  (VITAMIN D3) 30 MCG/15ML LIQD, Take by mouth. Pt. Takes 5000 IU daily, Disp: , Rfl:    Cyanocobalamin (B-12) 1000 MCG CAPS, , Disp: , Rfl:    EPINEPHrine  0.3 mg/0.3 mL IJ SOAJ injection, INJECT  INTRAMUSCULARLLY AS DIRECTED AS NEEDED FOR ANAPHYLAXIS, Disp: , Rfl:    estradiol  (ESTRACE ) 0.1 MG/GM vaginal cream, INSERT  1 GRAM VAGINALLY TWICE A WEEK AT BEDTIME, Disp: 43 g, Rfl: 1   Iron, Ferrous Sulfate, 325 (65 Fe) MG TABS, Take 325 mg by mouth daily., Disp: , Rfl:    lenalidomide  (REVLIMID ) 10 MG capsule, TAKE ONE CAPSULE BY MOUTH DAILY FOR 21 DAYS, THEN 7 DAYS OFF, Disp: 21 capsule, Rfl: 0   lidocaine  (LIDODERM ) 5 %, Place 1 patch onto the skin daily. Remove & Discard patch within 12 hours or as directed by MD, Disp: 14 patch, Rfl: 0   LORazepam  (ATIVAN ) 0.5 MG tablet, Take 1 tablet (0.5 mg total) by mouth at bedtime as needed for sleep or anxiety. (Patient not taking: Reported on 04/21/2024), Disp: 30 tablet, Rfl: 0   Magnesium  400 MG CAPS, Take by mouth., Disp: , Rfl:    methocarbamol  (ROBAXIN ) 500 MG tablet, Take 1 tablet (500 mg total) by mouth 2 (two) times daily as needed for muscle spasms. (Patient not taking: Reported on 04/21/2024), Disp: 14 tablet, Rfl: 0   Multiple Vitamins-Minerals (PRESERVISION AREDS PO), Take 1 capsule by mouth 2 (two) times daily., Disp: , Rfl:    tiZANidine (ZANAFLEX) 4 MG tablet, Take by mouth. (Patient not taking: Reported on 04/21/2024), Disp: , Rfl:    LABORATORY DATA:  I have reviewed the data as listed     Latest Ref Rng & Units 05/21/2024    2:49 PM 04/18/2024    2:56 PM 03/19/2024    9:24 AM  CBC  WBC 4.0 - 10.5 K/uL 4.0  3.4  3.6   Hemoglobin 12.0 - 15.0 g/dL 85.8  87.2  87.0   Hematocrit 36.0 - 46.0 % 42.0  37.6  37.6   Platelets 150 - 400 K/uL 345  360  250       Latest Ref Rng & Units 05/21/2024    2:49 PM 04/18/2024    2:57 PM 03/19/2024    9:30 AM  CMP  Glucose 70 - 99 mg/dL 78  89  94   BUN 8 - 23 mg/dL 20  17  14    Creatinine 0.44 - 1.00 mg/dL 9.17  9.17  9.11   Sodium 135 - 145 mmol/L 140  140  140   Potassium 3.5 - 5.1 mmol/L 4.4  4.1  4.1   Chloride 98 - 111 mmol/L 105  108  107   CO2 22 - 32 mmol/L 30  28  27    Calcium  8.9 -  10.3 mg/dL 10.1  9.1  9.4   Total Protein 6.5 - 8.1 g/dL 7.0  6.6  6.9   Total Bilirubin 0.0 - 1.2 mg/dL 0.7  0.5  0.9   Alkaline Phos 38 - 126 U/L 72  84  79   AST 15 - 41 U/L 17  18  13    ALT 0 - 44 U/L 21  23  26       10/10/2019 Flow Cytometry (Repeat BM Bx from [facility]):   10/07/18 Repeat BM Bx from [facility]:   Surgical Pathology                                Case: [id number]                               Authorizing Provider:  Cheyenne Anette Dragon, NP    Collected:           07/04/2022 1015             Ordering Location:     [facility] and   Received:            07/04/2022 1048                                    [facility]                                                   Pathologist:           Elva Sheree Feast, MD                                                   Specimens:   A) - Bone Marrow, Aspirate                                                                        B) - Bone Marrow, Biopsy                                                                          C) - Bone Marrow, Clot                                                                 DIAGNOSIS   A-C. Peripheral  blood and bone marrow (peripheral smear, aspirate, touch preparation, core biopsy, and clot section): - Low-level plasma cell neoplasm, see comment.   COMMENT: The core biopsy is 40% cellular. Cyclin-D1 immunohistochemical stain demonstrates very rare positive plasma cells (1%). Concurrent flow cytometry study ([id number]) shows a plasma cell population with an atypical immunophenotype, comprising approximately 0.04% of analyzed events.    Reviewed by resident/fellow SINA RUSH  Electronically signed by Elva Sheree Feast, MD on 07/10/2022 at 12:25 PM  Clinical Information   68 year old female with history of plasma cell myeloma.  Gross Examination   A. Peripheral blood and bone marrow labeled with patient's name and medical record number, one peripheral blood smear,  three bone marrow aspirate smears and one bone marrow touch preparation are received.   B. Bone marrow biopsy, in [facility].  Core biopsies of red-brown tissue 1.4 x 0.2 cm in aggregate are submitted in total as block B1 for decalcification.   C. Bone marrow clot, in [facility].  A 1.8 x 1.8 x 0.3 cm aggregate of red-brown clot is submitted in total as block C1.    J.Elam/ Dr. Juanita   Microscopic Examination        Lab Results  Component Value Date    WBC 5.2 07/04/2022    HGB 12.1 07/04/2022    PLT 364 07/04/2022    MCV 95 07/04/2022    RDWCV 14.6 (H) 07/04/2022    Peripheral blood and automated blood count:  RBCs: The red blood cells are adequate in number,  normocytic and normochromic, with minimal anisopoikilocytosis. WBCs: The white blood cells are adequate in number and show unremarkable morphology. Platelets: The platelets are adequate in number and show unremarkable morphology.   Bone marrow aspirate:  There are adequate cellular particles. M:E ratio is 3:1.  Granulopoiesis: Maturing myeloid elements are present and show complete maturation. Erythropoiesis:  Erythroid elements are present and show complete maturation. Megakaryocytes: Megakaryocytes are adequate in number and show unremarkable morphology.  There are slightly increased plasma cells with unremarkable morphology.   Bone marrow touch preparation: Adequate marrow elements are present, without additional findings to the aspirate smears.   Prussian blue stained aspirate smear: 0 of 4 stainable iron is present. No ringed sideroblasts are identified.   Bone marrow core biopsy:  The core biopsy is adequate for evaluation. The cellularity is 40%. Maturing myeloid elements are present and show complete maturation. Maturing erythroid elements are present and show complete maturation.  Megakaryocytes are adequate in number and show unremarkable morphology. There are slightly increased plasma cells.   A reticulin stain of the  core biopsy shows no significant reticulin fibrosis.   Aspirate clot section: There are adequate particles present, without additional findings to the core biopsy.   Immunohistochemical stains for Cyclin-D1, CD138, Kappa CISH, and Lambda CISH are performed on the core biopsy section (block B1). CD138, kappa, and lambda in-situ hybridization demonstrates 5-10% predominantly polytypic plasma cells. Cyclin-D1 demonstrates very rare positive plasma cells (1%).  Bone Marrow Differential   500 CELL DIFF   Cell Type Cell Count Reference Range Cell Type Cell Count Reference Range Cell Type Cell Count Reference Range  Segs 23 (7-25) Lymphocytes 15 (3-20) Plasma Cells 8 (0-3.5)  Bands 3 (6-36)     Atypicals 0   RBC Precursors 22 (10-30)  Metas 2 (9-25) Lymphoblasts *   Pronormoblasts 0 (0-3)  Myelos 5 (8-15) Eosinophils 12 (0-4) M:E Ratio 3/1    Promyelos 1 (1-6) Basophils 0 (0-1) Iron 0 (Scale 0-4+)  Myeloblasts 1 (0-3.5) Monocytes 8 (0-2)                                                                                                     Performed by:    ALMARIE SOJA                                                                                                         July 04, 2022 12:21 PM  Additional Documentation   All immunohistochemistry, in situ hybridization tests and special stains performed at [facility] and reported herein were developed, validated and their performance characteristics determined by the [facility]. During the performance of these tests, appropriate positive and negative control slides are also performed and reviewed. All control slides and internal controls (when applicable) demonstrate the expected immunoreactive patterns and/or nucleic acid hybridization. These ancillary studies were deemed medically necessary by the requesting pathologist. They were ordered following review of the H&E and clinical history except when part of a  liver/kidney protocol or where clinical history (e.g. immunocompromised, critically ill, history of malignancy) clearly indicates. Some of the tests may not be cleared or approved by the U.S. Food and Drug Administration (FDA).  The FDA has determined that such clearance or approval is not necessary.  These tests are used for clinical purposes and should not be regarded as investigational or as research.  This laboratory is certified under the Clinical Laboratory Improvement Amendments of 1988 (CLIA) as qualified to perform high complexity clinical testing. At least one block in this case underwent decalcification during its processing. The vast majority of immunohistochemical and hybridization assays were not validated on decalcified tissues. The results were interpreted with caution given the possibility of false negative results on decalcified specimens.  Attestation   All of the diagnostic evaluations on the enumerated specimens have been personally conducted by the pathologists involved in the care of this patient as indicated by the electronic signatures above.  Resulting Agency [facility] SURGICAL PATHOLOGY AND CYTOPATHOLOGY   Specimen Collected: 07/04/22 10:15   Performed by: [facility] SURGICAL PATHOLOGY AND CYTOPATHOLOGY Last Resulted: 07/10/22 12:25  Received From: [facility]  Result Received: 07/14/22 09:58   View Encounter      Received Information Pathology - Bone Marrow (Order [id number])    suggestion  Information displayed in this report may not trend or trigger automated decision support.    Pathology - Bone Marrow Order: [id number] Component 4 wk ago  Case Report Surgical Pathology  Case: [id number]                               Authorizing Provider:  Cheyenne Anette Dragon, NP    Collected:           07/04/2022 1015             Ordering Location:     [facility]   Received:            07/04/2022 1048                                     [facility]                                                   Pathologist:           Elva Sheree Feast, MD                                                   Specimens:   A) - Bone Marrow, Aspirate                                                                        B) - Bone Marrow, Biopsy                                                                          C) - Bone Marrow, Clot                                                                DIAGNOSIS   A-C. Peripheral blood and bone marrow (peripheral smear, aspirate, touch preparation, core biopsy, and clot section): - Low-level plasma cell neoplasm, see comment.   COMMENT: The core biopsy is 40% cellular. Cyclin-D1 immunohistochemical stain demonstrates very rare positive plasma cells (1%). Concurrent flow cytometry study ([id number]) shows a plasma cell population with an atypical immunophenotype, comprising approximately 0.04% of analyzed events.    Reviewed by resident/fellow [facility]  Electronically signed by Elva Sheree Feast, MD on 07/10/2022 at 12:25 PM  Clinical Information   68 year old female with history of plasma cell myeloma.  Gross Examination   A. Peripheral blood and bone marrow labeled with patient's name and medical record number, one peripheral blood smear, three bone marrow aspirate smears  and one bone marrow touch preparation are received.   B. Bone marrow biopsy, in [facility].  Core biopsies of red-brown tissue 1.4 x 0.2 cm in aggregate are submitted in total as block B1 for decalcification.   C. Bone marrow clot, in [facility].  A 1.8 x 1.8 x 0.3 cm aggregate of red-brown clot is submitted in total as block C1.    J.Elam/ Dr. Juanita     RADIOGRAPHIC STUDIES: I have personally reviewed the radiological images as listed and agreed with the findings in the report. No results found.   ASSESSMENT & PLAN:  ALONAH LINEBACK is a 68 y.o. female who presents for continued management of  multiple myeloma.   #IgA lambda R-ISS stage II multiple myeloma with innumerable lytic lesions in the calvarium, lesion in T7 vertebra and multiple lesions in the pelvis.  --Diagnosed in January 2017. --Status post Vd x 1 cycle, VRd x 6 cycles --Underwent HD Melphalan 200mg /m2 on 03/22/2016 --Undewent Autologous HSCT on 03/23/2016 with Dr Oscar at [facility] (Dose of 6.4 x10^6/kg) --Underwent bone marrow biopsy on 10/07/18 cytometry indicate that the pt continues to be in CR --10/10/2019 Flow Cytometry revealed A. Bone Marrow (Plasma Cell Myeloma Minimal Residual Disease Detection by Flow Cytometry): Negative. No phenotypically abnormal plasma cells at or above the limit of detection identified.  PLAN:  Labs from today were discussed in detail with the patient CBC within normal limits with no anemia.  Hemoglobin of 14.1 WBC count of 4k and platelets of 345k CMP within normal limits Myeloma panel showed no monoclonal protein spike and negative IFE Patient with no clinical signs or symptoms of myeloma progression at this time. No notable toxicities from her current treatment regimen.. Will continue the same maintenance therapy with daratumumab  and Revlimid  with no dose changes in the send supportive medications. -Continue acyclovir  for VZV prophylaxis -Continue aspirin  for VTE prophylaxis.  FOLLOW UP: Per integrated scheduling  .The total time spent in the appointment was 30 minutes* .  All of the patient's questions were answered with apparent satisfaction. The patient knows to call the clinic with any problems, questions or concerns.   Emaline Saran MD MS AAHIVMS [facility] Hematology/Oncology Physician [facility]  .*Total Encounter Time as defined by the Centers for Medicare and Medicaid Services includes, in addition to the face-to-face time of a patient visit (documented in the note above) non-face-to-face time: obtaining and reviewing outside history, ordering and  reviewing medications, tests or procedures, care coordination (communications with other health care professionals or caregivers) and documentation in the medical record.

## 2024-06-06 ENCOUNTER — Other Ambulatory Visit: Payer: Self-pay

## 2024-06-06 DIAGNOSIS — C9001 Multiple myeloma in remission: Secondary | ICD-10-CM

## 2024-06-06 MED ORDER — LENALIDOMIDE 10 MG PO CAPS: ORAL_CAPSULE | ORAL | 0 refills | Status: DC

## 2024-06-08 ENCOUNTER — Other Ambulatory Visit: Payer: Self-pay | Admitting: Hematology

## 2024-06-08 DIAGNOSIS — C9001 Multiple myeloma in remission: Secondary | ICD-10-CM

## 2024-06-11 ENCOUNTER — Ambulatory Visit

## 2024-06-11 ENCOUNTER — Ambulatory Visit: Admitting: Physician Assistant

## 2024-06-11 ENCOUNTER — Other Ambulatory Visit

## 2024-06-18 ENCOUNTER — Encounter: Payer: Self-pay | Admitting: Hematology

## 2024-06-18 ENCOUNTER — Inpatient Hospital Stay (HOSPITAL_BASED_OUTPATIENT_CLINIC_OR_DEPARTMENT_OTHER): Admitting: Hematology

## 2024-06-18 ENCOUNTER — Inpatient Hospital Stay

## 2024-06-18 ENCOUNTER — Inpatient Hospital Stay: Attending: Hematology

## 2024-06-18 VITALS — BP 114/67 | HR 83 | Temp 97.7°F | Resp 20 | Wt 129.1 lb

## 2024-06-18 DIAGNOSIS — Z85238 Personal history of other malignant neoplasm of thymus: Secondary | ICD-10-CM | POA: Insufficient documentation

## 2024-06-18 DIAGNOSIS — Z9481 Bone marrow transplant status: Secondary | ICD-10-CM | POA: Diagnosis not present

## 2024-06-18 DIAGNOSIS — Z5112 Encounter for antineoplastic immunotherapy: Secondary | ICD-10-CM | POA: Insufficient documentation

## 2024-06-18 DIAGNOSIS — C9000 Multiple myeloma not having achieved remission: Secondary | ICD-10-CM | POA: Diagnosis present

## 2024-06-18 DIAGNOSIS — C9001 Multiple myeloma in remission: Secondary | ICD-10-CM

## 2024-06-18 DIAGNOSIS — Z7961 Long term (current) use of immunomodulator: Secondary | ICD-10-CM | POA: Diagnosis not present

## 2024-06-18 DIAGNOSIS — Z79899 Other long term (current) drug therapy: Secondary | ICD-10-CM | POA: Insufficient documentation

## 2024-06-18 LAB — CBC WITH DIFFERENTIAL (CANCER CENTER ONLY)
Abs Immature Granulocytes: 0 K/uL (ref 0.00–0.07)
Basophils Absolute: 0.1 K/uL (ref 0.0–0.1)
Basophils Relative: 2 %
Eosinophils Absolute: 0.2 K/uL (ref 0.0–0.5)
Eosinophils Relative: 4 %
HCT: 39.5 % (ref 36.0–46.0)
Hemoglobin: 13.6 g/dL (ref 12.0–15.0)
Immature Granulocytes: 0 %
Lymphocytes Relative: 17 %
Lymphs Abs: 0.8 K/uL (ref 0.7–4.0)
MCH: 31.9 pg (ref 26.0–34.0)
MCHC: 34.4 g/dL (ref 30.0–36.0)
MCV: 92.5 fL (ref 80.0–100.0)
Monocytes Absolute: 0.6 K/uL (ref 0.1–1.0)
Monocytes Relative: 13 %
Neutro Abs: 3 K/uL (ref 1.7–7.7)
Neutrophils Relative %: 64 %
Platelet Count: 324 K/uL (ref 150–400)
RBC: 4.27 MIL/uL (ref 3.87–5.11)
RDW: 14.6 % (ref 11.5–15.5)
WBC Count: 4.7 K/uL (ref 4.0–10.5)
nRBC: 0 % (ref 0.0–0.2)

## 2024-06-18 LAB — CMP (CANCER CENTER ONLY)
ALT: 18 U/L (ref 0–44)
AST: 15 U/L (ref 15–41)
Albumin: 4.2 g/dL (ref 3.5–5.0)
Alkaline Phosphatase: 75 U/L (ref 38–126)
Anion gap: 5 (ref 5–15)
BUN: 24 mg/dL — ABNORMAL HIGH (ref 8–23)
CO2: 27 mmol/L (ref 22–32)
Calcium: 9.3 mg/dL (ref 8.9–10.3)
Chloride: 108 mmol/L (ref 98–111)
Creatinine: 0.79 mg/dL (ref 0.44–1.00)
GFR, Estimated: >60 mL/min (ref >60)
Glucose, Bld: 76 mg/dL (ref 70–99)
Potassium: 4 mmol/L (ref 3.5–5.1)
Sodium: 140 mmol/L (ref 135–145)
Total Bilirubin: 0.6 mg/dL (ref 0.0–1.2)
Total Protein: 6.6 g/dL (ref 6.5–8.1)

## 2024-06-18 MED ORDER — ACETAMINOPHEN 325 MG PO TABS
650.0000 mg | ORAL_TABLET | Freq: Once | ORAL | Status: AC
  Administered 2024-06-18: 650 mg via ORAL
  Filled 2024-06-18: qty 2

## 2024-06-18 MED ORDER — FAMOTIDINE 20 MG PO TABS
20.0000 mg | ORAL_TABLET | Freq: Once | ORAL | Status: AC
  Administered 2024-06-18: 20 mg via ORAL
  Filled 2024-06-18: qty 1

## 2024-06-18 MED ORDER — DARATUMUMAB-HYALURONIDASE-FIHJ 1800-30000 MG-UT/15ML ~~LOC~~ SOLN
1800.0000 mg | Freq: Once | SUBCUTANEOUS | Status: AC
  Administered 2024-06-18: 1800 mg via SUBCUTANEOUS
  Filled 2024-06-18: qty 15

## 2024-06-18 MED ORDER — DIPHENHYDRAMINE HCL 25 MG PO CAPS
25.0000 mg | ORAL_CAPSULE | Freq: Once | ORAL | Status: AC
  Administered 2024-06-18: 25 mg via ORAL
  Filled 2024-06-18: qty 1

## 2024-06-18 NOTE — Progress Notes (Signed)
 HEMATOLOGY ONCOLOGY PROGRESS NOTE  Date of service: 06/18/2024  Patient Care Team: Seabron Lenis, MD as PCP - General (Family Medicine) Pietro Redell RAMAN, MD as PCP - Cardiology (Cardiology) Livingston Rigg, MD as Consulting Physician (Dermatology) Cathlyn JAYSON Cary, Bobie BRAVO, MD as Consulting Physician (Obstetrics and Gynecology) Onesimo Emaline Brink, MD as Consulting Physician (Hematology) Patel, Donika K, DO as Consulting Physician (Neurology)  CHIEF COMPLAINT: Follow-up for continued valuation and management of multiple myeloma  Diagnosis: Diagnosed in January 2017. IgA lambda R-ISS stage II multiple myeloma with innumerable lytic lesions in the calvarium, lesion in T7 vertebra and multiple lesions in the pelvis.   Current Treatment:  Revlimid  10mg  po daily 3 weeks on 1 week off  Daratumumab  added 11/15 for loss of MRD negative status    Previous Treatment: Status post Vd x 1 cycle VRd x 6 cycles   HD Melphalan 200mg /m2 on 03/22/2016 Autologous HSCT on 03/23/2016 with Dr Oscar at [facility] (Dose of 6.4 x10^6/kg)  SUMMARY OF ONCOLOGIC HISTORY: Oncology History  Multiple myeloma  10/19/2015 Initial Diagnosis   Multiple myeloma   08/11/2022 -  Chemotherapy   Patient is on Treatment Plan : MYELOMA Daratumumab  SQ q28d     11/28/2023 Cancer Staging   Staging form: Plasma Cell Myeloma and Plasma Cell Disorders, AJCC 8th Edition - Clinical: High-risk cytogenetics: Absent, LDH: Normal - Signed by Onesimo Emaline Brink, MD on 11/28/2023 Stage prefix: Initial diagnosis Cytogenetics: t(11;14) translocation     INTERVAL HISTORY: Ms. Honda is a 68 y.o. female is here for continued evaluation and management of multiple myeloma.   Last seen by me on 05/21/2024, in good health w/o any new complaints (no new concerns, infection, focal bone pains, or dental issues), which she endorses today as well.  She is on maintenance treatment with daratumumab  Revlimid , which she is tolerating well w/o  new acute toxicities. She is followed by Dr. Guinevere @ [facility] for MRD response monitoring and on 08/14/23 she was seen virtually for a follow-up appointment to discuss the results of her bone marrow biospy.    REVIEW OF SYSTEMS:    10 Point review of systems of done and is negative except as noted above.  . Past Medical History:  Diagnosis Date   Abnormal Pap smear of vagina 03/22/2017   ASCUS pap and negative HR HPV.  Patient is on immunosuppressive medication.    Allergic reaction to bee sting    Anemia    AR (aortic regurgitation) 11/30/2017   trace noted on ECHO   Atrial septal aneurysm per 11-30-17 echo   trivial pericardial effusion   Complicated migraine    Constipation    DDD (degenerative disc disease), cervical    DDD (degenerative disc disease), lumbosacral    Fatty liver    Female proctocele without uterine prolapse    Fibroid    reason for Hysterectomy   FTT (failure to thrive) in adult 08/09/2017   Gastroesophageal reflux disease without esophagitis 01/17/2017   Generalized anxiety disorder    Grade I diastolic dysfunction    Hammer toe 05/13/2018   Hematuria 11/08/2015   History of COVID-19 12/2020   History of stress incontinence    Hypergammaglobulinemia    Hyperlipidemia    Hypoxemia 12/16/2007   Insomnia    Leg length discrepancy    Loss of appetite    Lung nodule    Malignant neoplasm of thymus (HCC) 12/03/2007   Metastatic multiple myeloma to bone (HCC) 10/19/2015   Metatarsalgia of right foot 07/15/2018  MR (mitral regurgitation) 11/30/2017   trace noted on ECHO   Muscular fasciculation    Nasal septal deviation    Nasal turbinate hypertrophy    Near syncope    Obstructive sleep apnea 12/03/2007   Other fatigue    Peripheral edema    Plasma cell dyscrasia    PONV (postoperative nausea and vomiting)    thinks morphine  caused nausea   Postoperative urinary retention 05/14/2018   Prediabetes    Pure hypercholesterolemia    Rash 11/01/2015    Restless leg syndrome    S/P autologous bone marrow transplantation (HCC) 03/29/2016   Shortness of breath 12/04/2007   TR (tricuspid regurgitation) 11/30/2017   trace noted on ECHO   Urinary incontinence    UTI (urinary tract infection)    Vaginal granulation tissue 12/03/2017   Vitamin D  deficiency    Wears glasses     . Past Surgical History:  Procedure Laterality Date   ABDOMINAL HYSTERECTOMY  1997   TAH--ovaries remain--Dr. Debby Lares   ANTERIOR AND POSTERIOR REPAIR N/A 05/14/2018   Procedure: ANTERIOR (CYSTOCELE) AND POSTERIOR REPAIR (RECTOCELE);  Surgeon: Cathlyn JAYSON Nikki Bobie FORBES, MD;  Location: [facility];  Service: Gynecology;  Laterality: N/A;   BLADDER SUSPENSION N/A 05/14/2018   Procedure: TRANSVAGINAL TAPE (TVT) PROCEDURE exact midurethral sling;  Surgeon: Cathlyn JAYSON Nikki Bobie FORBES, MD;  Location: [facility];  Service: Gynecology;  Laterality: N/A;   BONE MARROW TRANSPLANT  2017   done at [facility]   BREAST BIOPSY     COLONOSCOPY     CYSTOSCOPY N/A 05/14/2018   Procedure: CYSTOSCOPY;  Surgeon: Cathlyn JAYSON Nikki Bobie FORBES, MD;  Location: [facility];  Service: Gynecology;  Laterality: N/A;   CYSTOSCOPY N/A 05/30/2018   Procedure: CYSTOSCOPY;  Surgeon: Cathlyn JAYSON Nikki Bobie FORBES, MD;  Location: [facility];  Service: Gynecology;  Laterality: N/A;   ROBOTIC ASSISTED LAPAROSCOPIC SACROCOLPOPEXY N/A 05/14/2018   Procedure: XI ROBOTIC ASSISTED LAPAROSCOPIC SACROCOLPOPEXY;  Surgeon: Cathlyn JAYSON Nikki Bobie FORBES, MD;  Location: [facility];  Service: Gynecology;  Laterality: N/A;  6 hours OR time. Need extended stay recovery bed.   SALPINGOOPHORECTOMY Bilateral 05/14/2018   Procedure: SALPINGO OOPHORECTOMY;  Surgeon: Cathlyn JAYSON Nikki Bobie FORBES, MD;  Location: [facility];  Service: Gynecology;  Laterality: Bilateral;   THYMECTOMY  1999   TRANSVAGINAL TAPE (TVT) REMOVAL N/A 05/30/2018   Procedure: TRANSVAGINAL TAPE (TVT)  Revision;  Surgeon: Cathlyn JAYSON Nikki Bobie FORBES, MD;  Location: [facility];  Service: Gynecology;  Laterality: N/A;   WISDOM TOOTH EXTRACTION      . Social History   Tobacco Use   Smoking status: Never   Smokeless tobacco: Never  Vaping Use   Vaping status: Never Used  Substance Use Topics   Alcohol use: Not Currently    Alcohol/week: 0.0 standard drinks of alcohol   Drug use: Never    ALLERGIES:  is allergic to ampicillin and penicillins.  MEDICATIONS:  Current Outpatient Medications  Medication Sig Dispense Refill   acyclovir  (ZOVIRAX ) 400 MG tablet Take 1 tablet by mouth twice daily 60 tablet 0   Ascorbic Acid (VITAMIN C) 1000 MG tablet Take 1,000 mg by mouth 2 (two) times daily.     aspirin  EC 81 MG tablet Take 81 mg by mouth daily.     b complex vitamins capsule Take 1 capsule by mouth daily.     Cholecalciferol  (VITAMIN D3) 30 MCG/15ML LIQD Take by mouth. Pt. Takes 5000 IU daily  Cyanocobalamin (B-12) 1000 MCG CAPS      EPINEPHrine  0.3 mg/0.3 mL IJ SOAJ injection INJECT INTRAMUSCULARLLY AS DIRECTED AS NEEDED FOR ANAPHYLAXIS     estradiol  (ESTRACE ) 0.1 MG/GM vaginal cream INSERT  1 GRAM VAGINALLY TWICE A WEEK AT BEDTIME 43 g 1   Iron, Ferrous Sulfate, 325 (65 Fe) MG TABS Take 325 mg by mouth daily.     lenalidomide  (REVLIMID ) 10 MG capsule TAKE ONE CAPSULE BY MOUTH DAILY FOR 21 DAYS, THEN 7 DAYS OFF 21 capsule 0   lidocaine  (LIDODERM ) 5 % Place 1 patch onto the skin daily. Remove & Discard patch within 12 hours or as directed by MD 14 patch 0   LORazepam  (ATIVAN ) 0.5 MG tablet Take 1 tablet (0.5 mg total) by mouth at bedtime as needed for sleep or anxiety. (Patient not taking: Reported on 04/21/2024) 30 tablet 0   Magnesium  400 MG CAPS Take by mouth.     methocarbamol  (ROBAXIN ) 500 MG tablet Take 1 tablet (500 mg total) by mouth 2 (two) times daily as needed for muscle spasms. (Patient not taking: Reported on 04/21/2024) 14 tablet 0   Multiple Vitamins-Minerals (PRESERVISION AREDS PO) Take 1 capsule by mouth 2 (two) times daily.      tiZANidine (ZANAFLEX) 4 MG tablet Take by mouth. (Patient not taking: Reported on 04/21/2024)     No current facility-administered medications for this visit.    PHYSICAL EXAMINATION: ECOG PERFORMANCE STATUS: 1 - Symptomatic but completely ambulatory  . Vitals:   06/18/24 1309  BP: 114/67  Pulse: 83  Resp: 20  Temp: 97.7 F (36.5 C)  SpO2: 98%    Filed Weights   06/18/24 1309  Weight: 129 lb 1.6 oz (58.6 kg)   .Body mass index is 19.77 kg/m.  GENERAL:alert, in no acute distress and comfortable SKIN: no acute rashes, no significant lesions EYES: conjunctiva are pink and non-injected, sclera anicteric OROPHARYNX: MMM, no exudates, no oropharyngeal erythema or ulceration NECK: supple, no JVD LYMPH:  no palpable lymphadenopathy in the cervical, axillary or inguinal regions LUNGS: clear to auscultation b/l with normal respiratory effort HEART: regular rate & rhythm ABDOMEN:  normoactive bowel sounds , non tender, not distended. Extremity: no pedal edema PSYCH: alert & oriented x 3 with fluent speech NEURO: no focal motor/sensory deficits  LABORATORY DATA:   I have reviewed the data as listed     Latest Ref Rng & Units 06/18/2024   12:55 PM 05/21/2024    2:49 PM 04/18/2024    2:56 PM  CBC  WBC 4.0 - 10.5 K/uL 4.7  4.0  3.4   Hemoglobin 12.0 - 15.0 g/dL 86.3  85.8  87.2   Hematocrit 36.0 - 46.0 % 39.5  42.0  37.6   Platelets 150 - 400 K/uL 324  345  360    Component     Latest Ref Rng 05/21/2024 06/18/2024  IgG (Immunoglobin G), Serum     586 - 1,602 mg/dL 372  437 (L)   IgA     87 - 352 mg/dL 884  881   IgM (Immunoglobulin M), Srm     26 - 217 mg/dL 22 (L)  25 (L)   Total Protein ELP     6.0 - 8.5 g/dL 6.4 (C) 6.2 (C)  Albumin  SerPl Elph-Mcnc     2.9 - 4.4 g/dL 3.7 (C) 3.7 (C)  Alpha 1     0.0 - 0.4 g/dL 0.3 (C) 0.3 (C)  Alpha2 Glob SerPl Elph-Mcnc  0.4 - 1.0 g/dL 0.8 (C) 0.8 (C)  B-Globulin SerPl Elph-Mcnc     0.7 - 1.3 g/dL 1.1 (C) 1.0 (C)  Gamma  Glob SerPl Elph-Mcnc     0.4 - 1.8 g/dL 0.5 (C) 0.5 (C)  M Protein SerPl Elph-Mcnc     Not Observed g/dL Not Observed (C) Not Observed (C)  Globulin, Total     2.2 - 3.9 g/dL 2.7 (C) 2.5 (C)  Albumin /Glob SerPl     0.7 - 1.7  1.4 (C) 1.5 (C)  IFE 1 The immunofixation pattern appears unremarkable. Evidence of  monoclonal protein is not apparent.  The immunofixation pattern appears unremarkable. Evidence of  monoclonal protein is not apparent.        Latest Ref Rng & Units 06/18/2024   12:57 PM 05/21/2024    2:49 PM 04/18/2024    2:57 PM  CMP  Glucose 70 - 99 mg/dL 76  78  89   BUN 8 - 23 mg/dL 24  20  17    Creatinine 0.44 - 1.00 mg/dL 9.20  9.17  9.17   Sodium 135 - 145 mmol/L 140  140  140   Potassium 3.5 - 5.1 mmol/L 4.0  4.4  4.1   Chloride 98 - 111 mmol/L 108  105  108   CO2 22 - 32 mmol/L 27  30  28    Calcium  8.9 - 10.3 mg/dL 9.3  89.8  9.1   Total Protein 6.5 - 8.1 g/dL 6.6  7.0  6.6   Total Bilirubin 0.0 - 1.2 mg/dL 0.6  0.7  0.5   Alkaline Phos 38 - 126 U/L 75  72  84   AST 15 - 41 U/L 15  17  18    ALT 0 - 44 U/L 18  21  23    10/10/2019 Flow Cytometry (Repeat BM Bx from [facility]):    10/07/18 Repeat BM Bx from [facility]:    Surgical Pathology                                Case: [id number]                               Authorizing Provider:  Cheyenne Anette Dragon, NP    Collected:           07/04/2022 1015             Ordering Location:     [facility] and   Received:            07/04/2022 1048                                    [facility]                                                   Pathologist:           Elva Sheree Feast, MD  Specimens:   A) - Bone Marrow, Aspirate                                                                        B) - Bone Marrow, Biopsy                                                                          C) - Bone Marrow, Clot                                                                   DIAGNOSIS    A-C. Peripheral blood and bone marrow (peripheral smear, aspirate, touch preparation, core biopsy, and clot section): - Low-level plasma cell neoplasm, see comment.   COMMENT: The core biopsy is 40% cellular. Cyclin-D1 immunohistochemical stain demonstrates very rare positive plasma cells (1%). Concurrent flow cytometry study ([id number]) shows a plasma cell population with an atypical immunophenotype, comprising approximately 0.04% of analyzed events.    Reviewed by resident/fellow SINA RUSH  Electronically signed by Elva Sheree Feast, MD on 07/10/2022 at 12:25 PM  Clinical Information    68 year old female with history of plasma cell myeloma.  Gross Examination    A. Peripheral blood and bone marrow labeled with patient's name and medical record number, one peripheral blood smear, three bone marrow aspirate smears and one bone marrow touch preparation are received.   B. Bone marrow biopsy, in AZF.  Core biopsies of red-brown tissue 1.4 x 0.2 cm in aggregate are submitted in total as block B1 for decalcification.   C. Bone marrow clot, in AZF.  A 1.8 x 1.8 x 0.3 cm aggregate of red-brown clot is submitted in total as block C1.    J.Elam/ Dr. Juanita   Microscopic Examination             Lab Results  Component Value Date    WBC 5.2 07/04/2022    HGB 12.1 07/04/2022    PLT 364 07/04/2022    MCV 95 07/04/2022    RDWCV 14.6 (H) 07/04/2022    Peripheral blood and automated blood count:  RBCs: The red blood cells are adequate in number,  normocytic and normochromic, with minimal anisopoikilocytosis. WBCs: The white blood cells are adequate in number and show unremarkable morphology. Platelets: The platelets are adequate in number and show unremarkable morphology.   Bone marrow aspirate:  There are adequate cellular particles. M:E ratio is 3:1.  Granulopoiesis: Maturing myeloid elements are present and show complete  maturation. Erythropoiesis:  Erythroid elements are present and show complete maturation. Megakaryocytes: Megakaryocytes are adequate in number and show unremarkable morphology.  There are slightly increased plasma cells with unremarkable morphology.   Bone marrow touch preparation: Adequate marrow elements are present, without  additional findings to the aspirate smears.   Prussian blue stained aspirate smear: 0 of 4 stainable iron is present. No ringed sideroblasts are identified.   Bone marrow core biopsy:  The core biopsy is adequate for evaluation. The cellularity is 40%. Maturing myeloid elements are present and show complete maturation. Maturing erythroid elements are present and show complete maturation.  Megakaryocytes are adequate in number and show unremarkable morphology. There are slightly increased plasma cells.   A reticulin stain of the core biopsy shows no significant reticulin fibrosis.   Aspirate clot section: There are adequate particles present, without additional findings to the core biopsy.   Immunohistochemical stains for Cyclin-D1, CD138, Kappa CISH, and Lambda CISH are performed on the core biopsy section (block B1). CD138, kappa, and lambda in-situ hybridization demonstrates 5-10% predominantly polytypic plasma cells. Cyclin-D1 demonstrates very rare positive plasma cells (1%).  Bone Marrow Differential    500 CELL DIFF   Cell Type Cell Count Reference Range Cell Type Cell Count Reference Range Cell Type Cell Count Reference Range  Segs 23 (7-25) Lymphocytes 15 (3-20) Plasma Cells 8 (0-3.5)  Bands 3 (6-36)     Atypicals 0   RBC Precursors 22 (10-30)  Metas 2 (9-25) Lymphoblasts *   Pronormoblasts 0 (0-3)  Myelos 5 (8-15) Eosinophils 12 (0-4) M:E Ratio 3/1    Promyelos 1 (1-6) Basophils 0 (0-1) Iron 0 (Scale 0-4+)  Myeloblasts 1 (0-3.5) Monocytes 8 (0-2)                                                                                                      Performed by:    ALMARIE SOJA                                                                                                         July 04, 2022 12:21 PM  Additional Documentation    All immunohistochemistry, in situ hybridization tests and special stains performed at [facility] and reported herein were developed, validated and their performance characteristics determined by the [facility]. During the performance of these tests, appropriate positive and negative control slides are also performed and reviewed. All control slides and internal controls (when applicable) demonstrate the expected immunoreactive patterns and/or nucleic acid hybridization. These ancillary studies were deemed medically necessary by the requesting pathologist. They were ordered following review of the H&E and clinical history except when part of a liver/kidney protocol or where clinical history (e.g. immunocompromised, critically ill, history of malignancy) clearly indicates. Some of the tests may not be cleared or approved by the U.S. Food and Drug Administration (FDA).  The FDA has determined  that such clearance or approval is not necessary.  These tests are used for clinical purposes and should not be regarded as investigational or as research.  This laboratory is certified under the Clinical Laboratory Improvement Amendments of 1988 (CLIA) as qualified to perform high complexity clinical testing. At least one block in this case underwent decalcification during its processing. The vast majority of immunohistochemical and hybridization assays were not validated on decalcified tissues. The results were interpreted with caution given the possibility of false negative results on decalcified specimens.  Attestation    All of the diagnostic evaluations on the enumerated specimens have been personally conducted by the pathologists involved in the care of this patient as indicated by the  electronic signatures above.  Resulting Agency [facility] SURGICAL PATHOLOGY AND CYTOPATHOLOGY         Specimen Collected: 07/04/22 10:15    Performed by: [facility] SURGICAL PATHOLOGY AND CYTOPATHOLOGY Last Resulted: 07/10/22 12:25  Received From: [facility]  Result Received: 07/14/22 09:58    View Encounter      Received Information Pathology - Bone Marrow (Order [id number])    suggestion  Information displayed in this report may not trend or trigger automated decision support.    Pathology - Bone Marrow Order: [id number] Component 4 wk ago  Case Report Surgical Pathology                                Case: [id number]                               Authorizing Provider:  Cheyenne Anette Dragon, NP    Collected:           07/04/2022 1015             Ordering Location:     [facility] and   Received:            07/04/2022 1048                                    [facility]                                                   Pathologist:           Elva Sheree Feast, MD                                                   Specimens:   A) - Bone Marrow, Aspirate                                                                        B) - Bone Marrow, Biopsy  C) - Bone Marrow, Clot                                                                DIAGNOSIS    A-C. Peripheral blood and bone marrow (peripheral smear, aspirate, touch preparation, core biopsy, and clot section): - Low-level plasma cell neoplasm, see comment.   COMMENT: The core biopsy is 40% cellular. Cyclin-D1 immunohistochemical stain demonstrates very rare positive plasma cells (1%). Concurrent flow cytometry study ([id number]) shows a plasma cell population with an atypical immunophenotype, comprising approximately 0.04% of analyzed events.    Reviewed by resident/fellow SINA RUSH  Electronically signed by Elva Sheree Feast, MD on 07/10/2022 at 12:25 PM  Clinical Information    68 year old female with history of plasma cell myeloma.  Gross Examination    A. Peripheral blood and bone marrow labeled with patient's name and medical record number, one peripheral blood smear, three bone marrow aspirate smears and one bone marrow touch preparation are received.   B. Bone marrow biopsy, in AZF.  Core biopsies of red-brown tissue 1.4 x 0.2 cm in aggregate are submitted in total as block B1 for decalcification.   C. Bone marrow clot, in AZF.  A 1.8 x 1.8 x 0.3 cm aggregate of red-brown clot is submitted in total as block C1.    J.Elam/ Dr. Kozman    Results for orders placed or performed in visit on 08/01/23 [facility] Hgb 14.3 wbc 3.5 plts 246 Creatinine 1 calcium  10 SPEP IFE IgG kappa FLC 2.12 2.01 1.05 Total IgG 590 BMBX 1% Cyclin D1% MRD 0.01%   RADIOGRAPHIC STUDIES: I have personally reviewed the radiological images as listed and agreed with the findings in the report. No results found.  ASSESSMENT & PLAN:  JOSEPHENE MARRONE is a 68 y.o. female who presents for continued management of multiple myeloma.    #IgA lambda R-ISS stage II multiple myeloma with innumerable lytic lesions in the calvarium, lesion in T7 vertebra and multiple lesions in the pelvis.  --Diagnosed in January 2017. --Status post Vd x 1 cycle, VRd x 6 cycles --Underwent HD Melphalan 200mg /m2 on 03/22/2016 --Undewent Autologous HSCT on 03/23/2016 with Dr Oscar at [facility] (Dose of 6.4 x10^6/kg) --Underwent bone marrow biopsy on 10/07/18 cytometry indicate that the pt continues to be in CR --10/10/2019 Flow Cytometry revealed A. Bone Marrow (Plasma Cell Myeloma Minimal Residual Disease Detection by Flow Cytometry): Negative. No phenotypically abnormal plasma cells at or above the limit of detection identified.    PLAN:  - Labs from today 06/18/2024 were discussed in detail with the patient: CBC within normal limits with no  anemia.  Hemoglobin of 13.6, WBC count of 4.7K and platelets of 324K. CMP within normal limits. Myeloma panel showed no monoclonal protein spike and negative IFE. - Patient with no clinical signs or symptoms of myeloma progression at this time.  - No notable toxicities from her current treatment regimen; will continue the same maintenance therapy with daratumumab  and Revlimid  with no dose changes in the send supportive medications. - Continue acyclovir  for VZV prophylaxis - Continue aspirin  for VTE prophylaxis.   FOLLOW UP: 08/05/2024 w/ [facility] 08/13/2024 w/ Dr. Onesimo  Monthly Daratumumab  per integrated scheduling MD visit every 2 months -- plz cancel extra  visits   The total time spent in the appointment was 30 minutes*.  All of the patient's questions were answered with apparent satisfaction. The patient knows to call the clinic with any problems, questions or concerns.   Emaline Saran MD MS AAHIVMS [facility] Hematology/Oncology Physician [facility]  .*Total Encounter Time as defined by the Centers for Medicare and Medicaid Services includes, in addition to the face-to-face time of a patient visit (documented in the note above) non-face-to-face time: obtaining and reviewing outside history, ordering and reviewing medications, tests or procedures, care coordination (communications with other health care professionals or caregivers) and documentation in the medical record.   I,Emily Lagle,acting as a Neurosurgeon for Emaline Saran, MD.,have documented all relevant documentation on the behalf of Emaline Saran, MD,as directed by  Emaline Saran, MD while in the presence of Emaline Saran, MD.  .I have reviewed the above documentation for accuracy and completeness, and I agree with the above. .Amaira Safley Kishore Corley Maffeo MD

## 2024-06-18 NOTE — Patient Instructions (Signed)
 [facility] - A DEPT OF [facility]  Discharge Instructions: Thank you for choosing [facility] to provide your oncology and hematology care.   If you have a lab appointment with the Cancer Center, please go directly to the Cancer Center and check in at the registration area.   Wear comfortable clothing and clothing appropriate for easy access to any Portacath or PICC line.   We strive to give you quality time with your provider. You may need to reschedule your appointment if you arrive late (15 or more minutes).  Arriving late affects you and other patients whose appointments are after yours.  Also, if you miss three or more appointments without notifying the office, you may be dismissed from the clinic at the provider's discretion.      For prescription refill requests, have your pharmacy contact our office and allow 72 hours for refills to be completed.    Today you received the following chemotherapy and/or immunotherapy agents: daratumumab-hyaluronidase-fihj      To help prevent nausea and vomiting after your treatment, we encourage you to take your nausea medication as directed.  BELOW ARE SYMPTOMS THAT SHOULD BE REPORTED IMMEDIATELY: *FEVER GREATER THAN 100.4 F (38 C) OR HIGHER *CHILLS OR SWEATING *NAUSEA AND VOMITING THAT IS NOT CONTROLLED WITH YOUR NAUSEA MEDICATION *UNUSUAL SHORTNESS OF BREATH *UNUSUAL BRUISING OR BLEEDING *URINARY PROBLEMS (pain or burning when urinating, or frequent urination) *BOWEL PROBLEMS (unusual diarrhea, constipation, pain near the anus) TENDERNESS IN MOUTH AND THROAT WITH OR WITHOUT PRESENCE OF ULCERS (sore throat, sores in mouth, or a toothache) UNUSUAL RASH, SWELLING OR PAIN  UNUSUAL VAGINAL DISCHARGE OR ITCHING   Items with * indicate a potential emergency and should be followed up as soon as possible or go to the Emergency Department if any problems should occur.  Please show the CHEMOTHERAPY ALERT  CARD or IMMUNOTHERAPY ALERT CARD at check-in to the Emergency Department and triage nurse.  Should you have questions after your visit or need to cancel or reschedule your appointment, please contact [facility] - A DEPT OF [facility]  Dept: [phone]  and follow the prompts.  Office hours are 8:00 a.m. to 4:30 p.m. Monday - Friday. Please note that voicemails left after 4:00 p.m. may not be returned until the following business day.  We are closed weekends and major holidays. You have access to a nurse at all times for urgent questions. Please call the main number to the clinic Dept: [phone] and follow the prompts.   For any non-urgent questions, you may also contact your provider using MyChart. We now offer e-Visits for anyone 37 and older to request care online for non-urgent symptoms. For details visit [email or web address].   Also download the MyChart app! Go to the app store, search "MyChart", open the app, select [facility], and log in with your MyChart username and password.

## 2024-06-21 LAB — MULTIPLE MYELOMA PANEL, SERUM
Albumin SerPl Elph-Mcnc: 3.7 g/dL (ref 2.9–4.4)
Albumin/Glob SerPl: 1.5 (ref 0.7–1.7)
Alpha 1: 0.3 g/dL (ref 0.0–0.4)
Alpha2 Glob SerPl Elph-Mcnc: 0.8 g/dL (ref 0.4–1.0)
B-Globulin SerPl Elph-Mcnc: 1 g/dL (ref 0.7–1.3)
Gamma Glob SerPl Elph-Mcnc: 0.5 g/dL (ref 0.4–1.8)
Globulin, Total: 2.5 g/dL (ref 2.2–3.9)
IgA: 118 mg/dL (ref 87–352)
IgG (Immunoglobin G), Serum: 562 mg/dL — ABNORMAL LOW (ref 586–1602)
IgM (Immunoglobulin M), Srm: 25 mg/dL — ABNORMAL LOW (ref 26–217)
Total Protein ELP: 6.2 g/dL (ref 6.0–8.5)

## 2024-06-25 ENCOUNTER — Encounter: Payer: Self-pay | Admitting: Hematology

## 2024-07-02 ENCOUNTER — Other Ambulatory Visit: Payer: Self-pay

## 2024-07-02 DIAGNOSIS — C9001 Multiple myeloma in remission: Secondary | ICD-10-CM

## 2024-07-02 MED ORDER — LENALIDOMIDE 10 MG PO CAPS
ORAL_CAPSULE | ORAL | 0 refills | Status: AC
Start: 2024-07-02 — End: ?

## 2024-07-09 ENCOUNTER — Ambulatory Visit

## 2024-07-09 ENCOUNTER — Other Ambulatory Visit: Payer: Self-pay | Admitting: Hematology

## 2024-07-09 ENCOUNTER — Other Ambulatory Visit

## 2024-07-09 ENCOUNTER — Ambulatory Visit: Admitting: Hematology

## 2024-07-09 DIAGNOSIS — C9001 Multiple myeloma in remission: Secondary | ICD-10-CM

## 2024-07-13 ENCOUNTER — Other Ambulatory Visit: Payer: Self-pay | Admitting: Obstetrics and Gynecology

## 2024-07-13 DIAGNOSIS — N952 Postmenopausal atrophic vaginitis: Secondary | ICD-10-CM

## 2024-07-14 NOTE — Telephone Encounter (Signed)
 Med refill request: estradiol  (ESTRACE ) 0.1 MG/GM vaginal cream  Start:  09/25/23 Disp:  43 g Refills:  1  Last AEX:  09/25/23 Next AEX:  09/30/2024 Last MMG (if hormonal med):  01/28/24 Refill authorized? Please Advise.

## 2024-07-16 ENCOUNTER — Other Ambulatory Visit

## 2024-07-16 ENCOUNTER — Ambulatory Visit

## 2024-07-16 ENCOUNTER — Ambulatory Visit: Admitting: Hematology

## 2024-07-18 ENCOUNTER — Inpatient Hospital Stay: Attending: Hematology

## 2024-07-18 ENCOUNTER — Ambulatory Visit: Admitting: Hematology

## 2024-07-18 ENCOUNTER — Other Ambulatory Visit

## 2024-07-18 ENCOUNTER — Encounter: Payer: Self-pay | Admitting: Hematology

## 2024-07-18 VITALS — BP 117/68 | HR 78 | Temp 98.4°F | Resp 18 | Wt 129.5 lb

## 2024-07-18 DIAGNOSIS — C9001 Multiple myeloma in remission: Secondary | ICD-10-CM

## 2024-07-18 DIAGNOSIS — Z79899 Other long term (current) drug therapy: Secondary | ICD-10-CM | POA: Diagnosis not present

## 2024-07-18 DIAGNOSIS — C9000 Multiple myeloma not having achieved remission: Secondary | ICD-10-CM | POA: Insufficient documentation

## 2024-07-18 DIAGNOSIS — Z5112 Encounter for antineoplastic immunotherapy: Secondary | ICD-10-CM | POA: Insufficient documentation

## 2024-07-18 LAB — CBC WITH DIFFERENTIAL (CANCER CENTER ONLY)
Abs Immature Granulocytes: 0.01 K/uL (ref 0.00–0.07)
Basophils Absolute: 0.1 K/uL (ref 0.0–0.1)
Basophils Relative: 1 %
Eosinophils Absolute: 0.2 K/uL (ref 0.0–0.5)
Eosinophils Relative: 5 %
HCT: 38.4 % (ref 36.0–46.0)
Hemoglobin: 12.8 g/dL (ref 12.0–15.0)
Immature Granulocytes: 0 %
Lymphocytes Relative: 11 %
Lymphs Abs: 0.5 K/uL — ABNORMAL LOW (ref 0.7–4.0)
MCH: 31.5 pg (ref 26.0–34.0)
MCHC: 33.3 g/dL (ref 30.0–36.0)
MCV: 94.6 fL (ref 80.0–100.0)
Monocytes Absolute: 0.9 K/uL (ref 0.1–1.0)
Monocytes Relative: 18 %
Neutro Abs: 3.2 K/uL (ref 1.7–7.7)
Neutrophils Relative %: 65 %
Platelet Count: 260 K/uL (ref 150–400)
RBC: 4.06 MIL/uL (ref 3.87–5.11)
RDW: 14.8 % (ref 11.5–15.5)
WBC Count: 4.9 K/uL (ref 4.0–10.5)
nRBC: 0 % (ref 0.0–0.2)

## 2024-07-18 LAB — CMP (CANCER CENTER ONLY)
ALT: 25 U/L (ref 0–44)
AST: 17 U/L (ref 15–41)
Albumin: 4.1 g/dL (ref 3.5–5.0)
Alkaline Phosphatase: 92 U/L (ref 38–126)
Anion gap: 7 (ref 5–15)
BUN: 17 mg/dL (ref 8–23)
CO2: 28 mmol/L (ref 22–32)
Calcium: 9.2 mg/dL (ref 8.9–10.3)
Chloride: 106 mmol/L (ref 98–111)
Creatinine: 1.01 mg/dL — ABNORMAL HIGH (ref 0.44–1.00)
GFR, Estimated: 60 mL/min — ABNORMAL LOW (ref >60)
Glucose, Bld: 133 mg/dL — ABNORMAL HIGH (ref 70–99)
Potassium: 3.9 mmol/L (ref 3.5–5.1)
Sodium: 141 mmol/L (ref 135–145)
Total Bilirubin: 0.8 mg/dL (ref 0.0–1.2)
Total Protein: 6.5 g/dL (ref 6.5–8.1)

## 2024-07-18 MED ORDER — DARATUMUMAB-HYALURONIDASE-FIHJ 1800-30000 MG-UT/15ML ~~LOC~~ SOLN
1800.0000 mg | Freq: Once | SUBCUTANEOUS | Status: AC
  Administered 2024-07-18: 1800 mg via SUBCUTANEOUS
  Filled 2024-07-18: qty 15

## 2024-07-18 MED ORDER — ACETAMINOPHEN 325 MG PO TABS
650.0000 mg | ORAL_TABLET | Freq: Once | ORAL | Status: AC
  Administered 2024-07-18: 650 mg via ORAL
  Filled 2024-07-18: qty 2

## 2024-07-18 MED ORDER — FAMOTIDINE 20 MG PO TABS
20.0000 mg | ORAL_TABLET | Freq: Once | ORAL | Status: AC
  Administered 2024-07-18: 20 mg via ORAL
  Filled 2024-07-18: qty 1

## 2024-07-18 MED ORDER — DIPHENHYDRAMINE HCL 25 MG PO CAPS
25.0000 mg | ORAL_CAPSULE | Freq: Once | ORAL | Status: AC
  Administered 2024-07-18: 25 mg via ORAL
  Filled 2024-07-18: qty 1

## 2024-07-18 NOTE — Patient Instructions (Signed)

## 2024-07-25 LAB — MULTIPLE MYELOMA PANEL, SERUM
Albumin SerPl Elph-Mcnc: 3.2 g/dL (ref 2.9–4.4)
Albumin/Glob SerPl: 1.2 (ref 0.7–1.7)
Alpha 1: 0.3 g/dL (ref 0.0–0.4)
Alpha2 Glob SerPl Elph-Mcnc: 0.9 g/dL (ref 0.4–1.0)
B-Globulin SerPl Elph-Mcnc: 1 g/dL (ref 0.7–1.3)
Gamma Glob SerPl Elph-Mcnc: 0.6 g/dL (ref 0.4–1.8)
Globulin, Total: 2.8 g/dL (ref 2.2–3.9)
IgA: 109 mg/dL (ref 87–352)
IgG (Immunoglobin G), Serum: 556 mg/dL — ABNORMAL LOW (ref 586–1602)
IgM (Immunoglobulin M), Srm: 20 mg/dL — ABNORMAL LOW (ref 26–217)
Total Protein ELP: 6 g/dL (ref 6.0–8.5)

## 2024-07-30 ENCOUNTER — Other Ambulatory Visit: Payer: Self-pay

## 2024-07-30 DIAGNOSIS — C9001 Multiple myeloma in remission: Secondary | ICD-10-CM

## 2024-07-30 MED ORDER — LENALIDOMIDE 10 MG PO CAPS: ORAL_CAPSULE | ORAL | 0 refills | Status: DC

## 2024-08-10 ENCOUNTER — Other Ambulatory Visit: Payer: Self-pay | Admitting: Hematology

## 2024-08-10 DIAGNOSIS — C9001 Multiple myeloma in remission: Secondary | ICD-10-CM

## 2024-08-11 ENCOUNTER — Encounter: Payer: Self-pay | Admitting: Hematology

## 2024-08-13 ENCOUNTER — Inpatient Hospital Stay (HOSPITAL_BASED_OUTPATIENT_CLINIC_OR_DEPARTMENT_OTHER): Admitting: Hematology

## 2024-08-13 ENCOUNTER — Inpatient Hospital Stay

## 2024-08-13 ENCOUNTER — Inpatient Hospital Stay: Attending: Hematology

## 2024-08-13 VITALS — BP 121/72 | HR 88 | Temp 97.9°F | Resp 20 | Wt 129.8 lb

## 2024-08-13 DIAGNOSIS — Z5112 Encounter for antineoplastic immunotherapy: Secondary | ICD-10-CM

## 2024-08-13 DIAGNOSIS — C9001 Multiple myeloma in remission: Secondary | ICD-10-CM

## 2024-08-13 DIAGNOSIS — Z79899 Other long term (current) drug therapy: Secondary | ICD-10-CM | POA: Diagnosis not present

## 2024-08-13 DIAGNOSIS — C9000 Multiple myeloma not having achieved remission: Secondary | ICD-10-CM | POA: Insufficient documentation

## 2024-08-13 LAB — CBC WITH DIFFERENTIAL (CANCER CENTER ONLY)
Abs Immature Granulocytes: 0.01 K/uL (ref 0.00–0.07)
Basophils Absolute: 0.1 K/uL (ref 0.0–0.1)
Basophils Relative: 2 %
Eosinophils Absolute: 0.2 K/uL (ref 0.0–0.5)
Eosinophils Relative: 5 %
HCT: 41.3 % (ref 36.0–46.0)
Hemoglobin: 13.9 g/dL (ref 12.0–15.0)
Immature Granulocytes: 0 %
Lymphocytes Relative: 16 %
Lymphs Abs: 0.7 K/uL (ref 0.7–4.0)
MCH: 31.7 pg (ref 26.0–34.0)
MCHC: 33.7 g/dL (ref 30.0–36.0)
MCV: 94.1 fL (ref 80.0–100.0)
Monocytes Absolute: 0.8 K/uL (ref 0.1–1.0)
Monocytes Relative: 18 %
Neutro Abs: 2.4 K/uL (ref 1.7–7.7)
Neutrophils Relative %: 59 %
Platelet Count: 274 K/uL (ref 150–400)
RBC: 4.39 MIL/uL (ref 3.87–5.11)
RDW: 14.3 % (ref 11.5–15.5)
WBC Count: 4.1 K/uL (ref 4.0–10.5)
nRBC: 0 % (ref 0.0–0.2)

## 2024-08-13 MED ORDER — FAMOTIDINE 20 MG PO TABS
20.0000 mg | ORAL_TABLET | Freq: Once | ORAL | Status: AC
  Administered 2024-08-13: 20 mg via ORAL
  Filled 2024-08-13: qty 1

## 2024-08-13 MED ORDER — DARATUMUMAB-HYALURONIDASE-FIHJ 1800-30000 MG-UT/15ML ~~LOC~~ SOLN
1800.0000 mg | Freq: Once | SUBCUTANEOUS | Status: AC
  Administered 2024-08-13: 1800 mg via SUBCUTANEOUS
  Filled 2024-08-13: qty 15

## 2024-08-13 MED ORDER — ACETAMINOPHEN 325 MG PO TABS
650.0000 mg | ORAL_TABLET | Freq: Once | ORAL | Status: AC
  Administered 2024-08-13: 650 mg via ORAL
  Filled 2024-08-13: qty 2

## 2024-08-13 MED ORDER — DIPHENHYDRAMINE HCL 25 MG PO CAPS
25.0000 mg | ORAL_CAPSULE | Freq: Once | ORAL | Status: AC
  Administered 2024-08-13: 25 mg via ORAL
  Filled 2024-08-13: qty 1

## 2024-08-13 NOTE — Progress Notes (Signed)
 HEMATOLOGY ONCOLOGY PROGRESS NOTE  Date of service: 08/13/2024  Patient Care Team: Seabron Lenis, MD as PCP - General (Family Medicine) Pietro, Redell RAMAN, MD as PCP - Cardiology (Cardiology) Livingston Rigg, MD as Consulting Physician (Dermatology) Cathlyn JAYSON Cary, Bobie BRAVO, MD as Consulting Physician (Obstetrics and Gynecology) Onesimo Emaline Brink, MD as Consulting Physician (Hematology) Patel, Donika K, DO as Consulting Physician (Neurology)  CHIEF COMPLAINT/PURPOSE OF CONSULTATION: Follow-up for continued evaluation and management of multiple myeloma   HISTORY OF PRESENTING ILLNESS: (09/29/2015) Paula Johnson is a wonderful 68 y.o. female who has been referred to us  for evaluation and management of Anemia   Patient has a history of thymic carcinoma diagnosed in 1999 treated with thymectomy and postoperative radiation, obstructive sleep apnea treated with an oral appliance, restless leg syndrome, prediabetes was apparently in her usual state of health until a few months ago. She noted some gradually increasing fatigue, frequent headaches and numbness in the feet over the last few months. Blood work done at work and with her primary care physician showed normal hemoglobin of 12.3 in 06/2014. Her hemoglobin dropped down to 11 on 05/04/2015 and then 9.9 on 07/13/2015 with normal MCV. No evidence of bleeding. She had a colonoscopy and EGD on 08/16/2015 which he reports was unrevealing.  Last available outside labs from 07/27/2015 showed a hemoglobin of 10.1 with normal MCV. Her total protein and gammaglobulin fraction were noted to progressively increase since last year based on outside available labs. She notes no fevers or chills. No requirement for blood transfusions. No  Insect bites. No onset travel.   She notes that she was yawning/stretching and felt like one of her ribs popped. She subsequently had a thickened anterior chest wall pain and had an extended did not show any issues. Would  still continues to have chest pain with palpation. No cough.  Increasing headaches over the last few months. No focal neurological deficits. Normal aura. Difficult to localize.   No significant weight loss. Good appetite. Overall feels well. Has been working full time. Has been on several different nutritional supplements.   Her labs showed normal calcium  levels. No overt signs of kidney failure.  SUMMARY OF ONCOLOGIC HISTORY: Diagnosis: Diagnosed in January 2017. IgA lambda R-ISS stage II multiple myeloma with innumerable lytic lesions in the calvarium, lesion in T7 vertebra and multiple lesions in the pelvis.     Previous Treatment: Status post Vd x 1 cycle VRd x 6 cycles   HD Melphalan 200mg /m2 on 03/22/2016 Autologous HSCT on 03/23/2016 with Dr Oscar at [facility] (Dose of 6.4 x10^6/kg) Oncology History  Multiple myeloma  10/19/2015 Initial Diagnosis   Multiple myeloma   08/11/2022 -  Chemotherapy   Patient is on Treatment Plan : MYELOMA Daratumumab  SQ q28d     11/28/2023 Cancer Staging   Staging form: Plasma Cell Myeloma and Plasma Cell Disorders, AJCC 8th Edition - Clinical: High-risk cytogenetics: Absent, LDH: Normal - Signed by Onesimo Emaline Brink, MD on 11/28/2023 Stage prefix: Initial diagnosis Cytogenetics: t(11;14) translocation   Current Treatment:  Revlimid  10mg  po daily 3 weeks on 1 week off  Daratumumab  added 11/15 for loss of MRD negative status   INTERVAL HISTORY:  Paula Johnson is a 68 y.o. female who is here today for continued evaluation and management of Multiple Myeloma.   she was last seen by me on 06/18/2024; at the time she did not have any concerns and was doing well.   Today, she says that she has been doing  well and does not have any concerns. She has reportedly been tolerating treatment. She states she is up to date with age-recommended health maintenance and vaccines.  Denies any abdominal pain, new bone pains, or leg swelling.   She says that she  will be following up with Dr. Guinevere in December.   REVIEW OF SYSTEMS:   10 Point review of systems of done and is negative except as noted above.  MEDICAL HISTORY Past Medical History:  Diagnosis Date   Abnormal Pap smear of vagina 03/22/2017   ASCUS pap and negative HR HPV.  Patient is on immunosuppressive medication.    Allergic reaction to bee sting    Anemia    AR (aortic regurgitation) 11/30/2017   trace noted on ECHO   Atrial septal aneurysm per 11-30-17 echo   trivial pericardial effusion   Complicated migraine    Constipation    DDD (degenerative disc disease), cervical    DDD (degenerative disc disease), lumbosacral    Fatty liver    Female proctocele without uterine prolapse    Fibroid    reason for Hysterectomy   FTT (failure to thrive) in adult 08/09/2017   Gastroesophageal reflux disease without esophagitis 01/17/2017   Generalized anxiety disorder    Grade I diastolic dysfunction    Hammer toe 05/13/2018   Hematuria 11/08/2015   History of COVID-19 12/2020   History of stress incontinence    Hypergammaglobulinemia    Hyperlipidemia    Hypoxemia 12/16/2007   Insomnia    Leg length discrepancy    Loss of appetite    Lung nodule    Malignant neoplasm of thymus (HCC) 12/03/2007   Metastatic multiple myeloma to bone (HCC) 10/19/2015   Metatarsalgia of right foot 07/15/2018   MR (mitral regurgitation) 11/30/2017   trace noted on ECHO   Muscular fasciculation    Nasal septal deviation    Nasal turbinate hypertrophy    Near syncope    Obstructive sleep apnea 12/03/2007   Other fatigue    Peripheral edema    Plasma cell dyscrasia    PONV (postoperative nausea and vomiting)    thinks morphine  caused nausea   Postoperative urinary retention 05/14/2018   Prediabetes    Pure hypercholesterolemia    Rash 11/01/2015   Restless leg syndrome    S/P autologous bone marrow transplantation (HCC) 03/29/2016   Shortness of breath 12/04/2007   TR (tricuspid  regurgitation) 11/30/2017   trace noted on ECHO   Urinary incontinence    UTI (urinary tract infection)    Vaginal granulation tissue 12/03/2017   Vitamin D  deficiency    Wears glasses     SURGICAL HISTORY Past Surgical History:  Procedure Laterality Date   ABDOMINAL HYSTERECTOMY  1997   TAH--ovaries remain--Dr. Debby Lares   ANTERIOR AND POSTERIOR REPAIR N/A 05/14/2018   Procedure: ANTERIOR (CYSTOCELE) AND POSTERIOR REPAIR (RECTOCELE);  Surgeon: Cathlyn JAYSON Nikki Bobie FORBES, MD;  Location: [facility];  Service: Gynecology;  Laterality: N/A;   BLADDER SUSPENSION N/A 05/14/2018   Procedure: TRANSVAGINAL TAPE (TVT) PROCEDURE exact midurethral sling;  Surgeon: Cathlyn JAYSON Nikki Bobie FORBES, MD;  Location: [facility];  Service: Gynecology;  Laterality: N/A;   BONE MARROW TRANSPLANT  2017   done at [facility]   BREAST BIOPSY     COLONOSCOPY     CYSTOSCOPY N/A 05/14/2018   Procedure: CYSTOSCOPY;  Surgeon: Cathlyn JAYSON Nikki Bobie FORBES, MD;  Location: [facility];  Service: Gynecology;  Laterality: N/A;   CYSTOSCOPY N/A 05/30/2018  Procedure: CYSTOSCOPY;  Surgeon: Cathlyn JAYSON Nikki Bobie FORBES, MD;  Location[facility];  Service: Gynecology;  Laterality: N/A;   ROBOTIC ASSISTED LAPAROSCOPIC SACROCOLPOPEXY N/A 05/14/2018   Procedure: XI ROBOTIC ASSISTED LAPAROSCOPIC SACROCOLPOPEXY;  Surgeon: Cathlyn JAYSON Nikki Bobie FORBES, MD;  Location: [facility];  Service: Gynecology;  Laterality: N/A;  6 hours OR time. Need extended stay recovery bed.   SALPINGOOPHORECTOMY Bilateral 05/14/2018   Procedure: SALPINGO OOPHORECTOMY;  Surgeon: Cathlyn JAYSON Nikki Bobie FORBES, MD;  Location: [facility];  Service: Gynecology;  Laterality: Bilateral;   THYMECTOMY  1999   TRANSVAGINAL TAPE (TVT) REMOVAL N/A 05/30/2018   Procedure: TRANSVAGINAL TAPE (TVT)  Revision;  Surgeon: Cathlyn JAYSON Nikki Bobie FORBES, MD;  Location: [facility];  Service: Gynecology;  Laterality: N/A;   WISDOM TOOTH EXTRACTION      SOCIAL HISTORY Social History    Tobacco Use   Smoking status: Never   Smokeless tobacco: Never  Vaping Use   Vaping status: Never Used  Substance Use Topics   Alcohol use: Not Currently    Alcohol/week: 0.0 standard drinks of alcohol   Drug use: Never    Social History   Social History Narrative   Lives with husband   Caffeine- coffee, 2 cups daily   Right handed      Are you right handed or left handed? Right Handed   Are you currently employed ? No    What is your current occupation?   Do you live at home alone? No    Who lives with you? Husband    What type of home do you live in: 1 story or 2 story? Lives in a one story home with a basement.         SOCIAL DRIVERS OF HEALTH SDOH Screenings   Housing: Unknown (08/02/2024)   Received from [facility]  Depression [phone]): Low Risk  (07/18/2024)  Tobacco Use: Low Risk  (04/21/2024)     FAMILY HISTORY Family History  Problem Relation Age of Onset   Hyperlipidemia Mother    Memory loss Mother        29s; believed to be age-related memory decline   COPD Father        dec age 57/s-smoked   Macular degeneration Father    Behavior problems Father        at the end of life   Diabetes Brother    Macular degeneration Brother    Suicidality Brother    Diabetes Maternal Grandmother    Heart attack Paternal Grandfather      ALLERGIES: is allergic to ampicillin and penicillins.  MEDICATIONS  Current Outpatient Medications  Medication Sig Dispense Refill   acyclovir  (ZOVIRAX ) 400 MG tablet Take 1 tablet by mouth twice daily 60 tablet 0   Ascorbic Acid (VITAMIN C) 1000 MG tablet Take 1,000 mg by mouth 2 (two) times daily.     aspirin  EC 81 MG tablet Take 81 mg by mouth daily.     b complex vitamins capsule Take 1 capsule by mouth daily.     Cholecalciferol  (VITAMIN D3) 30 MCG/15ML LIQD Take by mouth. Pt. Takes 5000 IU daily     Cyanocobalamin (B-12) 1000 MCG CAPS      EPINEPHrine  0.3 mg/0.3 mL IJ SOAJ injection INJECT  INTRAMUSCULARLLY AS DIRECTED AS NEEDED FOR ANAPHYLAXIS     estradiol  (ESTRACE ) 0.01 % CREA vaginal cream INSERT 1 GRAM VAGINALLY TWICE A WEEK AT BEDTIME 43 g 0   Iron,  Ferrous Sulfate, 325 (65 Fe) MG TABS Take 325 mg by mouth daily.     lenalidomide  (REVLIMID ) 10 MG capsule Take 1 capsule (10 mg total) by mouth daily for 21 days. Take 7 days off. Repeat cycle. 21 capsule 0   lidocaine  (LIDODERM ) 5 % Place 1 patch onto the skin daily. Remove & Discard patch within 12 hours or as directed by MD 14 patch 0   LORazepam  (ATIVAN ) 0.5 MG tablet Take 1 tablet (0.5 mg total) by mouth at bedtime as needed for sleep or anxiety. (Patient not taking: Reported on 06/18/2024) 30 tablet 0   Magnesium  400 MG CAPS Take by mouth.     methocarbamol  (ROBAXIN ) 500 MG tablet Take 1 tablet (500 mg total) by mouth 2 (two) times daily as needed for muscle spasms. (Patient not taking: Reported on 06/18/2024) 14 tablet 0   Multiple Vitamins-Minerals (PRESERVISION AREDS PO) Take 1 capsule by mouth 2 (two) times daily.     tiZANidine (ZANAFLEX) 4 MG tablet Take by mouth. (Patient not taking: Reported on 06/18/2024)     No current facility-administered medications for this visit.    PHYSICAL EXAMINATION: ECOG PERFORMANCE STATUS: 1 - Symptomatic but completely ambulatory VITALS: Vitals:   08/13/24 1430  BP: 121/72  Pulse: 88  Resp: 20  Temp: 97.9 F (36.6 C)  SpO2: 98%   Filed Weights   08/13/24 1430  Weight: 129 lb 12.8 oz (58.9 kg)   Body mass index is 19.88 kg/m.  GENERAL: alert, in no acute distress and comfortable SKIN: no acute rashes, no significant lesions EYES: conjunctiva are pink and non-injected, sclera anicteric OROPHARYNX: MMM, no exudates, no oropharyngeal erythema or ulceration NECK: supple, no JVD LYMPH:  no palpable lymphadenopathy in the cervical, axillary or inguinal regions LUNGS: clear to auscultation b/l with normal respiratory effort HEART: regular rate & rhythm ABDOMEN:  normoactive  bowel sounds , non tender, not distended, no hepatosplenomegaly Extremity: no pedal edema PSYCH: alert & oriented x 3 with fluent speech NEURO: no focal motor/sensory deficits  LABORATORY DATA:   I have reviewed the data as listed     Latest Ref Rng & Units 08/13/2024    1:48 PM 07/18/2024    2:50 PM 06/18/2024   12:55 PM  CBC EXTENDED  WBC 4.0 - 10.5 K/uL 4.1  4.9  4.7   RBC 3.87 - 5.11 MIL/uL 4.39  4.06  4.27   Hemoglobin 12.0 - 15.0 g/dL 86.0  87.1  86.3   HCT 36.0 - 46.0 % 41.3  38.4  39.5   Platelets 150 - 400 K/uL 274  260  324   NEUT# 1.7 - 7.7 K/uL 2.4  3.2  3.0   Lymph# 0.7 - 4.0 K/uL 0.7  0.5  0.8       Latest Ref Rng & Units 07/18/2024    2:50 PM 06/18/2024   12:57 PM 05/21/2024    2:49 PM  CMP  Glucose 70 - 99 mg/dL 866  76  78   BUN 8 - 23 mg/dL 17  24  20    Creatinine 0.44 - 1.00 mg/dL 8.98  9.20  9.17   Sodium 135 - 145 mmol/L 141  140  140   Potassium 3.5 - 5.1 mmol/L 3.9  4.0  4.4   Chloride 98 - 111 mmol/L 106  108  105   CO2 22 - 32 mmol/L 28  27  30    Calcium  8.9 - 10.3 mg/dL 9.2  9.3  89.8   Total  Protein 6.5 - 8.1 g/dL 6.5  6.6  7.0   Total Bilirubin 0.0 - 1.2 mg/dL 0.8  0.6  0.7   Alkaline Phos 38 - 126 U/L 92  75  72   AST 15 - 41 U/L 17  15  17    ALT 0 - 44 U/L 25  18  21     MULTIPLE MYELOMA & KAPPA/LAMBDA LIGHT CHAINS 12/2023 - 06/2024   08/05/2024 FISH - Myeloma Panel INTERPRETATION   Unable to result. Separation of CD138+ plasma cells from this marrow specimen failed to yield an adequate number or specificity of cells for FISH.     08/05/2024 Pathology - Bone Marrow DIAGNOSIS  [facility]  A-C. Peripheral blood and bone marrow (peripheral blood smear, aspirate smear, touch imprint, clot and core biopsy): - Very low-level plasma cell neoplasm, <1% Cyclin-D1(+) plasma cells, see comment.   COMMENT: The core biopsy is 30% cellular. Cyclin-D1 shows very rare positive plasma cells, comprising <1% of the core  biopsy cellularity. Concurrent flow cytometry study ([id number]) demonstrates no evidence of a plasma cell population with an aberrant immunophenotype. Plasma cells are negative for CD38 by flow cytometry, which is most consistent with anti-CD38 antibody therapy.  Gross Examination  [facility]  A. Peripheral blood and bone marrow labeled with patient's name and medical record number, one peripheral blood smear, three bone marrow aspirate smears and one bone marrow touch preparation are received. B. Bone marrow biopsy, received in [facility] is a 1.6 cm long 0.3 cm diameter core of tan and brown bony tissue; submitted entirely in block B1 for decalcification. C.  Bone marrow clot,  received in [facility] is a 3.3 x 2.5 x 0.5 cm aggregate of red-brown hemorrhagic material; submitted entirely in block C1-2.   S [place]/Dr. Rulon  Microscopic Examination  [facility] SURGICAL PATHOLOGY AND CYTOPATHOLOGY       Lab Results  Component Value Date    WBC 3.5 08/05/2024    HGB 13.9 08/05/2024    PLT 262 08/05/2024    MCV 95 08/05/2024    MCHC 33.3 08/05/2024    RDWCV 14.3 08/05/2024    Peripheral blood and automated blood count:  RBCs: The red blood cells are adequate in number,  normocytic and normochromic, with mild anisopoikilocytosis, including elliptocytes. WBCs: The white blood cells are adequate in number and show unremarkable morphology. Platelets: The platelets are adequate in number and show unremarkable morphology.   Bone marrow aspirate:  There are cellular particles. M:E ratio is 1:1.  Granulopoiesis: Maturing myeloid elements are present and show complete maturation. Erythropoiesis:  Erythroid elements are present and show complete maturation. Megakaryocytes: Megakaryocytes are adequate in number and show unremarkable morphology. There are scattered plasma cells with scattered enlarged forms with dispersed chromatin.   Bone marrow touch preparation: Rare marrow elements are  present, without additional findings to the aspirate smears.   Prussian blue stained aspirate smear: Absent stainable iron. No ringed sideroblasts are identified.   Bone marrow core biopsy:  The core biopsy is adequate for evaluation. The cellularity is 30%. Maturing myeloid elements are present and show complete maturation. Maturing erythroid elements are present and show complete maturation. Megakaryocytes are adequate in number and show unremarkable morphology.   Aspirate clot section: There are rare cellular particles present, without additional findings to the core biopsy.   Immunohistochemical stains for Cyclin-D1, CD138, kappa, and lambda are performed on the core biopsy (block B1). Controls stain appropriately. CD138, kappa, and lambda demonstrate  scattered polytypic plasma cells. Cyclin-D1 shows very rare positive plasma cells (<1%).   Bone Marrow Differential  [facility]  500 CELL DIFF   Cell Type      % Reference Range (%) Cell type      % Reference Range (%) Cell Type      % Reference Range (%)  Segs 5 (7-25) Lymphocytes 8 (3-20) Plasma Cells 3 (0-3.5)  Bands 9 (6-36)     Atypicals 0   RBC Precursors 36 (10-30)  Metamyelocytes 11 (9-25)       Pronormoblasts 1 (0-3)  Myelocytes 7 (8-15) Eosinophils 7 (0-4) M:E Ratio 1:1    Promyelocytes 2 (1-6) Basophils 1 (0-1) Iron Tr (Scale 0-4)  Blasts 3 (0-3.5) Monocytes 7 (0-2)                                                                                                     Performed by:    RANDALL AGENT                                                                                                         August 05, 2024 11:42 AM   Comments (Technologist): 3% blasts/blast equivalent cells and 3% plasma cells present. Rare atypical plasma cells with binucleation and irregular cytoplasmic borders noted.    10/10/2019 Flow Cytometry (Repeat BM Bx from [facility]):    10/07/18 Repeat BM Bx from [facility]:         Surgical Pathology                                Case: [id number]                               Authorizing Provider:  [place]yenne Anette Dragon, NP    Collected:           07/04/2022 1015             Ordering Location:     [facility]   Received:            07/04/2022 1048                                    [facility]  Pathologist:           Elva Sheree Feast, MD                                                   Specimens:   A) - Bone Marrow, Aspirate                                                                        B) - Bone Marrow, Biopsy                                                                          C) - Bone Marrow, Clot                                                                   DIAGNOSIS     A-C. Peripheral blood and bone marrow (peripheral smear, aspirate, touch preparation, core biopsy, and clot section): - Low-level plasma cell neoplasm, see comment.   COMMENT: The core biopsy is 40% cellular. Cyclin-D1 immunohistochemical stain demonstrates very rare positive plasma cells (1%). Concurrent flow cytometry study ([id number]) shows a plasma cell population with an atypical immunophenotype, comprising approximately 0.04% of analyzed events.    Reviewed by resident/fellow SINA RUSH   Electronically signed by Elva Sheree Feast, MD on 07/10/2022 at 12:25 PM   Clinical Information     68 year old female with history of plasma cell myeloma.   Gross Examination     A. Peripheral blood and bone marrow labeled with patient's name and medical record number, one peripheral blood smear, three bone marrow aspirate smears and one bone marrow touch preparation are received.   B. Bone marrow biopsy, in [facility].  Core biopsies of red-brown tissue 1.4 x 0.2 cm in aggregate are submitted in total as block B1 for decalcification.   C. Bone marrow clot, in [facility].  A 1.8 x 1.8 x 0.3 cm aggregate of  red-brown clot is submitted in total as block C1.    J.[place]/ Dr. Juanita    Microscopic Examination              Lab Results  Component Value Date    WBC 5.2 07/04/2022    HGB 12.1 07/04/2022    PLT 364 07/04/2022    MCV 95 07/04/2022    RDWCV 14.6 (H) 07/04/2022    Peripheral blood and automated blood count:  RBCs: The red blood cells are adequate in number,  normocytic and normochromic, with minimal anisopoikilocytosis. WBCs: The white blood cells are adequate in number and show unremarkable morphology. Platelets: The platelets are adequate in  number and show unremarkable morphology.   Bone marrow aspirate:  There are adequate cellular particles. M:E ratio is 3:1.  Granulopoiesis: Maturing myeloid elements are present and show complete maturation. Erythropoiesis:  Erythroid elements are present and show complete maturation. Megakaryocytes: Megakaryocytes are adequate in number and show unremarkable morphology.  There are slightly increased plasma cells with unremarkable morphology.   Bone marrow touch preparation: Adequate marrow elements are present, without additional findings to the aspirate smears.   Prussian blue stained aspirate smear: 0 of 4 stainable iron is present. No ringed sideroblasts are identified.   Bone marrow core biopsy:  The core biopsy is adequate for evaluation. The cellularity is 40%. Maturing myeloid elements are present and show complete maturation. Maturing erythroid elements are present and show complete maturation.  Megakaryocytes are adequate in number and show unremarkable morphology. There are slightly increased plasma cells.   A reticulin stain of the core biopsy shows no significant reticulin fibrosis.   Aspirate clot section: There are adequate particles present, without additional findings to the core biopsy.   Immunohistochemical stains for Cyclin-D1, CD138, Kappa CISH, and Lambda CISH are performed on the core biopsy section (block B1).  CD138, kappa, and lambda in-situ hybridization demonstrates 5-10% predominantly polytypic plasma cells. Cyclin-D1 demonstrates very rare positive plasma cells (1%).   Bone Marrow Differential     500 CELL DIFF   Cell Type Cell Count Reference Range Cell Type Cell Count Reference Range Cell Type Cell Count Reference Range  Segs 23 (7-25) Lymphocytes 15 (3-20) Plasma Cells 8 (0-3.5)  Bands 3 (6-36)     Atypicals 0   RBC Precursors 22 (10-30)  Metas 2 (9-25) Lymphoblasts *   Pronormoblasts 0 (0-3)  Myelos 5 (8-15) Eosinophils 12 (0-4) M:E Ratio 3/1    Promyelos 1 (1-6) Basophils 0 (0-1) Iron 0 (Scale 0-4+)  Myeloblasts 1 (0-3.5) Monocytes 8 (0-2)                                                                                                     Performed by:    ALMARIE SOJA                                                                                                         July 04, 2022 12:21 PM  Additional Documentation     All immunohistochemistry, in situ hybridization tests and special stains performed at [facility] and reported herein were developed, validated and their performance characteristics determined by the [facility]. During the performance of these tests, appropriate positive and negative control slides are also performed and reviewed. All control slides and internal controls (  when applicable) demonstrate the expected immunoreactive patterns and/or nucleic acid hybridization. These ancillary studies were deemed medically necessary by the requesting pathologist. They were ordered following review of the H&E and clinical history except when part of a liver/kidney protocol or where clinical history (e.g. immunocompromised, critically ill, history of malignancy) clearly indicates. Some of the tests may not be cleared or approved by the U.S. Food and Drug Administration (FDA).  The FDA has determined that such clearance or approval is not  necessary.  These tests are used for clinical purposes and should not be regarded as investigational or as research.  This laboratory is certified under the Clinical Laboratory Improvement Amendments of 1988 (CLIA) as qualified to perform high complexity clinical testing. At least one block in this case underwent decalcification during its processing. The vast majority of immunohistochemical and hybridization assays were not validated on decalcified tissues. The results were interpreted with caution given the possibility of false negative results on decalcified specimens.   Attestation     All of the diagnostic evaluations on the enumerated specimens have been personally conducted by the pathologists involved in the care of this patient as indicated by the electronic signatures above.   Resulting Agency [facility] SURGICAL PATHOLOGY AND CYTOPATHOLOGY              Specimen Collected: 07/04/22 10:15    Performed by: [facility] SURGICAL PATHOLOGY AND CYTOPATHOLOGY Last Resulted: 07/10/22 12:25  Received From: [facility]  Result Received: 07/14/22 09:58     View Encounter      Received Information Pathology - Bone Marrow (Order [id number])    suggestion  Information displayed in this report may not trend or trigger automated decision support.    Pathology - Bone Marrow Order: [id number] Component 4 wk ago  Case Report Surgical Pathology                                Case: [id number]                               Authorizing Provider:  Cheyenne Anette Dragon, NP    Collected:           07/04/2022 1015             Ordering Location:     [facility]   Received:            07/04/2022 1048                                    [facility]                                                   Pathologist:           Elva Sheree Feast, MD                                                   Specimens:   A) - Bone Marrow, Aspirate  B) - Bone Marrow, Biopsy                                                                          C) - Bone Marrow, Clot                                                                DIAGNOSIS    A-C. Peripheral blood and bone marrow (peripheral smear, aspirate, touch preparation, core biopsy, and clot section): - Low-level plasma cell neoplasm, see comment.   COMMENT: The core biopsy is 40% cellular. Cyclin-D1 immunohistochemical stain demonstrates very rare positive plasma cells (1%). Concurrent flow cytometry study ([id number]) shows a plasma cell population with an atypical immunophenotype, comprising approximately 0.04% of analyzed events.    Reviewed by resident/fellow SINA RUSH  Electronically signed by Elva Sheree Feast, MD on 07/10/2022 at 12:25 PM  Clinical Information    68 year old female with history of plasma cell myeloma.  Gross Examination    A. Peripheral blood and bone marrow labeled with patient's name and medical record number, one peripheral blood smear, three bone marrow aspirate smears and one bone marrow touch preparation are received.   B. Bone marrow biopsy, in AZ[facility].  Core biopsies of red-brown tissue 1.4 x 0.2 cm in aggregate are submitted in total as block B1 for decalcification.   C. Bone marrow clot, in AZF.  A 1.8 x 1.8 x 0.3 cm aggregate of red-brown clot is submitted in total as block C1.    J.Elam/ Dr. Kozman     Results for orders placed or performed in visit on 08/01/23 [facility] Hgb 14.3 wbc 3.5 plts 246 Creatinine 1 calcium  10 SPEP IFE IgG kappa FLC 2.12 2.01 1.05 Total IgG 590 BMBX 1% Cyclin D1% MRD 0.01%  RADIOGRAPHIC STUDIES: I have personally reviewed the radiological images as listed and agreed with the findings in the report. No results found.   ASSESSMENT & PLAN:  68 y.o. female with  #IgA lambda R-ISS stage II multiple myeloma with innumerable lytic lesions in the calvarium, lesion in T7  vertebra and multiple lesions in the pelvis.  --Diagnosed in January 2017. --Status post Vd x 1 cycle, VRd x 6 cycles --Underwent HD Melphalan 200mg /m2 on 03/22/2016 --Undewent Autologous HSCT on 03/23/2016 with Dr Oscar at [facility] (Dose of 6.4 x10^6/kg) --Underwent bone marrow biopsy on 10/07/18 cytometry indicate that the pt continues to be in CR --10/10/2019 Flow Cytometry revealed A. Bone Marrow (Plasma Cell Myeloma Minimal Residual Disease Detection by Flow Cytometry): Negative. No phenotypically abnormal plasma cells at or above the limit of detection identified.   PLAN: - Discussed lab results on 08/13/2024 in detail with patient: CBC normal. 07/25/2024 M protein undetected.  - Reviewed 08/05/2024 Bone Marrow Biopsy : Very low-level plasma cell neoplasm, <1% Cyclin-D1(+) plasma cells. The core biopsy is 30% cellular. Cyclin-D1 shows very rare positive plasma cells, comprising <1% of the core biopsy cellularity. Concurrent flow cytometry study ([id number]) demonstrates no evidence of a plasma cell population with  an aberrant immunophenotype. Plasma cells are negative for CD38 by flow cytometry, which is most consistent with anti-CD38 antibody therapy.  - Reviewed 08/05/2024 FISH:  Unable to result. Separation of CD138+ plasma cells from this marrow specimen failed to yield an adequate number or specificity of cells for FISH.  -patient with no evidence of myeloma progression. Infact is MRD neg. -no notable toxicities from current treatment regimen.4-will continue Dara faspro/Revlimid  dual med maintenance treatment.  -continue ASA and acyclovir  as supportive medications.  FOLLOW-UP  Monthly Daratumumab  per integrated scheduling MD visit every 2-3 months   The total time spent in the appointment was 30 minutes* .  All of the patient's questions were answered and the patient knows to call the clinic with any problems, questions, or concerns.  Emaline Saran MD MS AAHIVMS [facility]  [facility] Hematology/Oncology Physician [facility]  *Total Encounter Time as defined by the Centers for Medicare and Medicaid Services includes, in addition to the face-to-face time of a patient visit (documented in the note above) non-face-to-face time: obtaining and reviewing outside history, ordering and reviewing medications, tests or procedures, care coordination (communications with other health care professionals or caregivers) and documentation in the medical record.  I,Emily Lagle,acting as a neurosurgeon for Emaline Saran, MD.,have documented all relevant documentation on the behalf of Emaline Saran, MD,as directed by  Emaline Saran, MD while in the presence of Emaline Saran, MD.  I have reviewed the above documentation for accuracy and completeness, and I agree with the above.  Charmon Thorson, MD

## 2024-08-13 NOTE — Patient Instructions (Signed)

## 2024-08-14 ENCOUNTER — Other Ambulatory Visit: Payer: Self-pay

## 2024-08-15 LAB — MULTIPLE MYELOMA PANEL, SERUM
Albumin SerPl Elph-Mcnc: 3.6 g/dL (ref 2.9–4.4)
Albumin/Glob SerPl: 1.4 (ref 0.7–1.7)
Alpha 1: 0.2 g/dL (ref 0.0–0.4)
Alpha2 Glob SerPl Elph-Mcnc: 0.9 g/dL (ref 0.4–1.0)
B-Globulin SerPl Elph-Mcnc: 1 g/dL (ref 0.7–1.3)
Gamma Glob SerPl Elph-Mcnc: 0.6 g/dL (ref 0.4–1.8)
Globulin, Total: 2.6 g/dL (ref 2.2–3.9)
IgA: 134 mg/dL (ref 87–352)
IgG (Immunoglobin G), Serum: 625 mg/dL (ref 586–1602)
IgM (Immunoglobulin M), Srm: 22 mg/dL — ABNORMAL LOW (ref 26–217)
Total Protein ELP: 6.2 g/dL (ref 6.0–8.5)

## 2024-08-19 ENCOUNTER — Encounter: Payer: Self-pay | Admitting: Hematology

## 2024-08-28 ENCOUNTER — Other Ambulatory Visit: Payer: Self-pay | Admitting: *Deleted

## 2024-08-28 DIAGNOSIS — C9001 Multiple myeloma in remission: Secondary | ICD-10-CM

## 2024-08-28 MED ORDER — LENALIDOMIDE 10 MG PO CAPS: ORAL_CAPSULE | ORAL | 0 refills | Status: DC

## 2024-08-28 NOTE — Telephone Encounter (Signed)
 Received faxed refill notification for Lenalidomide   Refilled per MD OV note 08/13/24: will continue Dara faspro/Revlimid  dual med maintenance treatment.

## 2024-08-29 ENCOUNTER — Other Ambulatory Visit: Payer: Self-pay

## 2024-09-01 ENCOUNTER — Other Ambulatory Visit: Payer: Self-pay | Admitting: Hematology

## 2024-09-01 DIAGNOSIS — C9001 Multiple myeloma in remission: Secondary | ICD-10-CM

## 2024-09-02 ENCOUNTER — Encounter: Payer: Self-pay | Admitting: Hematology

## 2024-09-10 ENCOUNTER — Inpatient Hospital Stay

## 2024-09-11 ENCOUNTER — Other Ambulatory Visit: Payer: Self-pay | Admitting: Hematology

## 2024-09-11 DIAGNOSIS — C9001 Multiple myeloma in remission: Secondary | ICD-10-CM

## 2024-09-15 ENCOUNTER — Inpatient Hospital Stay: Attending: Hematology

## 2024-09-15 DIAGNOSIS — C9001 Multiple myeloma in remission: Secondary | ICD-10-CM

## 2024-09-15 DIAGNOSIS — C9000 Multiple myeloma not having achieved remission: Secondary | ICD-10-CM | POA: Insufficient documentation

## 2024-09-15 LAB — CBC WITH DIFFERENTIAL (CANCER CENTER ONLY)
Abs Immature Granulocytes: 0.01 K/uL (ref 0.00–0.07)
Basophils Absolute: 0.1 K/uL (ref 0.0–0.1)
Basophils Relative: 1 %
Eosinophils Absolute: 0.1 K/uL (ref 0.0–0.5)
Eosinophils Relative: 2 %
HCT: 42.1 % (ref 36.0–46.0)
Hemoglobin: 14.5 g/dL (ref 12.0–15.0)
Immature Granulocytes: 0 %
Lymphocytes Relative: 20 %
Lymphs Abs: 0.8 K/uL (ref 0.7–4.0)
MCH: 31.9 pg (ref 26.0–34.0)
MCHC: 34.4 g/dL (ref 30.0–36.0)
MCV: 92.7 fL (ref 80.0–100.0)
Monocytes Absolute: 0.3 K/uL (ref 0.1–1.0)
Monocytes Relative: 7 %
Neutro Abs: 2.6 K/uL (ref 1.7–7.7)
Neutrophils Relative %: 70 %
Platelet Count: 258 K/uL (ref 150–400)
RBC: 4.54 MIL/uL (ref 3.87–5.11)
RDW: 14.2 % (ref 11.5–15.5)
WBC Count: 3.8 K/uL — ABNORMAL LOW (ref 4.0–10.5)
nRBC: 0 % (ref 0.0–0.2)

## 2024-09-15 LAB — CMP (CANCER CENTER ONLY)
ALT: 24 U/L (ref 0–44)
AST: 21 U/L (ref 15–41)
Albumin: 4.7 g/dL (ref 3.5–5.0)
Alkaline Phosphatase: 85 U/L (ref 38–126)
Anion gap: 10 (ref 5–15)
BUN: 18 mg/dL (ref 8–23)
CO2: 27 mmol/L (ref 22–32)
Calcium: 9.6 mg/dL (ref 8.9–10.3)
Chloride: 104 mmol/L (ref 98–111)
Creatinine: 0.81 mg/dL (ref 0.44–1.00)
GFR, Estimated: >60 mL/min
Glucose, Bld: 96 mg/dL (ref 70–99)
Potassium: 4.1 mmol/L (ref 3.5–5.1)
Sodium: 141 mmol/L (ref 135–145)
Total Bilirubin: 1.1 mg/dL (ref 0.0–1.2)
Total Protein: 7.1 g/dL (ref 6.5–8.1)

## 2024-09-17 ENCOUNTER — Other Ambulatory Visit: Payer: Self-pay

## 2024-09-22 LAB — MULTIPLE MYELOMA PANEL, SERUM
Albumin SerPl Elph-Mcnc: 3.7 g/dL (ref 2.9–4.4)
Albumin/Glob SerPl: 1.5 (ref 0.7–1.7)
Alpha 1: 0.2 g/dL (ref 0.0–0.4)
Alpha2 Glob SerPl Elph-Mcnc: 0.8 g/dL (ref 0.4–1.0)
B-Globulin SerPl Elph-Mcnc: 1 g/dL (ref 0.7–1.3)
Gamma Glob SerPl Elph-Mcnc: 0.6 g/dL (ref 0.4–1.8)
Globulin, Total: 2.6 g/dL (ref 2.2–3.9)
IgA: 117 mg/dL (ref 87–352)
IgG (Immunoglobin G), Serum: 647 mg/dL (ref 586–1602)
IgM (Immunoglobulin M), Srm: 25 mg/dL — ABNORMAL LOW (ref 26–217)
Total Protein ELP: 6.3 g/dL (ref 6.0–8.5)

## 2024-09-29 ENCOUNTER — Encounter: Payer: Self-pay | Admitting: Obstetrics and Gynecology

## 2024-09-29 NOTE — Progress Notes (Signed)
 "  69 y.o. G89P2002 Married Caucasian female here for a breast and pelvic exam.    The patient is also followed for LGSIL of vagina.   Last pap 09/25/23 LSIL, HR HPV neg Last colpo 10/22/23 - LGSIL.   States she had ongoing bleeding from the vagina with a bowel movement for months after her colposcopy.  No pain.    No bleeding for 2 - 3 months now.  Off immunosuppressive medication for 3 weeks now.  Multiple myeloma is in remission.   PCP: Seabron Lenis, MD   Patient's last menstrual period was 09/26/1995.           Sexually active: No.  The current method of family planning is status post hysterectomy.    Menopausal hormone therapy:  Estrace   Exercising: Yes.    [employer] classes Smoker:  no  OB History     Gravida  2   Para  2   Term  2   Preterm      AB      Living  2      SAB      IAB      Ectopic      Multiple      Live Births              HEALTH MAINTENANCE: Last 2 paps: 09/25/23 LSIL, HR HPV neg, 03/08/22 ASCUS, HR HPV neg  History of abnormal Pap or positive HPV:  yes Mammogram:  01/31/24 Breast Density Cat B, BIRADS Cat 2 benign  Colonoscopy:  07/2020 normal - due in 2026  Bone Density:  03/05/23  Result  normal    Immunization History  Administered Date(s) Administered   DTaP 05/09/2017   DTaP / IPV 07/24/2017   HIB (PRP-T) 05/09/2017, 07/24/2017   Hepatitis B, ADULT 05/09/2017, 07/24/2017   IPV 05/09/2017   Influenza Split 07/12/2022   Influenza,inj,Quad PF,6+ Mos 07/04/2016, 05/23/2019   Influenza,inj,quad, With Preservative 06/20/2015   Pneumococcal Conjugate-13 10/09/2016, 07/24/2017   Pneumococcal Polysaccharide-23 01/17/2017   Respiratory Syncytial Virus Vaccine,Recomb Aduvanted(Arexvy) 08/29/2022      reports that she has never smoked. She has never used smokeless tobacco. She reports that she does not currently use alcohol. She reports that she does not use drugs.  Past Medical History:  Diagnosis Date   Abnormal Pap smear of  vagina 03/22/2017   ASCUS pap and negative HR HPV.  Patient is on immunosuppressive medication.    Allergic reaction to bee sting    Anemia    AR (aortic regurgitation) 11/30/2017   trace noted on ECHO   Atrial septal aneurysm per 11-30-17 echo   trivial pericardial effusion   Complicated migraine    Constipation    DDD (degenerative disc disease), cervical    DDD (degenerative disc disease), lumbosacral    Fatty liver    Female proctocele without uterine prolapse    Fibroid    reason for Hysterectomy   FTT (failure to thrive) in adult 08/09/2017   Gastroesophageal reflux disease without esophagitis 01/17/2017   Generalized anxiety disorder    Grade I diastolic dysfunction    Hammer toe 05/13/2018   Hematuria 11/08/2015   History of COVID-19 12/2020   History of stress incontinence    Hypergammaglobulinemia    Hyperlipidemia    Hypoxemia 12/16/2007   Insomnia    Leg length discrepancy    Loss of appetite    Lung nodule    Malignant neoplasm of thymus (HCC) 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/2007   Metastatic multiple myeloma  to bone (HCC) 10/19/2015   Metatarsalgia of right foot 07/15/2018   MR (mitral regurgitation) 11/30/2017   trace noted on ECHO   Muscular fasciculation    Nasal septal deviation    Nasal turbinate hypertrophy    Near syncope    Obstructive sleep apnea 12/03/2007   Other fatigue    Peripheral edema    Plasma cell dyscrasia    PONV (postoperative nausea and vomiting)    thinks morphine  caused nausea   Postoperative urinary retention 05/14/2018   Prediabetes    Pure hypercholesterolemia    Rash 11/01/2015   Restless leg syndrome    S/P autologous bone marrow transplantation (HCC) 03/29/2016   Shortness of breath 12/04/2007   TR (tricuspid regurgitation) 11/30/2017   trace noted on ECHO   Urinary incontinence    UTI (urinary tract infection)    Vaginal granulation tissue 12/03/2017   Vitamin D  deficiency    Wears glasses     Past Surgical History:  Procedure  Laterality Date   ABDOMINAL HYSTERECTOMY  1997   TAH--ovaries remain--Dr. Debby Lares   ANTERIOR AND POSTERIOR REPAIR N/A 05/14/2018   Procedure: ANTERIOR (CYSTOCELE) AND POSTERIOR REPAIR (RECTOCELE);  Surgeon: Cathlyn JAYSON Nikki Bobie FORBES, MD;  Location: [facility];  Service: Gynecology;  Laterality: N/A;   BLADDER SUSPENSION N/A 05/14/2018   Procedure: TRANSVAGINAL TAPE (TVT) PROCEDURE exact midurethral sling;  Surgeon: Cathlyn JAYSON Nikki Bobie FORBES, MD;  Location: [facility];  Service: Gynecology;  Laterality: N/A;   BONE MARROW TRANSPLANT  2017   done at [facility]   BREAST BIOPSY     COLONOSCOPY     CYSTOSCOPY N/A 05/14/2018   Procedure: CYSTOSCOPY;  Surgeon: Cathlyn JAYSON Nikki Bobie FORBES, MD;  Location: [facility];  Service: Gynecology;  Laterality: N/A;   CYSTOSCOPY N/A 05/30/2018   Procedure: CYSTOSCOPY;  Surgeon: Cathlyn JAYSON Nikki Bobie FORBES, MD;  Location: [facility];  Service: Gynecology;  Laterality: N/A;   ROBOTIC ASSISTED LAPAROSCOPIC SACROCOLPOPEXY N/A 05/14/2018   Procedure: XI ROBOTIC ASSISTED LAPAROSCOPIC SACROCOLPOPEXY;  Surgeon: Cathlyn JAYSON Nikki Bobie FORBES, MD;  Location: [facility];  Service: Gynecology;  Laterality: N/A;  6 hours OR time. Need extended stay recovery bed.   SALPINGOOPHORECTOMY Bilateral 05/14/2018   Procedure: SALPINGO OOPHORECTOMY;  Surgeon: Cathlyn JAYSON Nikki Bobie FORBES, MD;  Location: [facility];  Service: Gynecology;  Laterality: Bilateral;   THYMECTOMY  1999   TRANSVAGINAL TAPE (TVT) REMOVAL N/A 05/30/2018   Procedure: TRANSVAGINAL TAPE (TVT)  Revision;  Surgeon: Cathlyn JAYSON Nikki Bobie FORBES, MD;  Location: [facility];  Service: Gynecology;  Laterality: N/A;   WISDOM TOOTH EXTRACTION      Current Outpatient Medications  Medication Sig Dispense Refill   acyclovir  (ZOVIRAX ) 400 MG tablet Take 1 tablet by mouth twice daily 60 tablet 0   Ascorbic Acid (VITAMIN C) 1000 MG tablet Take 1,000 mg by mouth 2 (two) times daily.     aspirin  EC 81 MG tablet Take 81 mg by mouth  daily.     b complex vitamins capsule Take 1 capsule by mouth daily.     Cholecalciferol  (VITAMIN D3) 30 MCG/15ML LIQD Take by mouth. Pt. Takes 5000 IU daily     Cyanocobalamin (B-12) 1000 MCG CAPS      EPINEPHrine  0.3 mg/0.3 mL IJ SOAJ injection INJECT INTRAMUSCULARLLY AS DIRECTED AS NEEDED FOR ANAPHYLAXIS     estradiol  (ESTRACE ) 0.01 % CREA vaginal cream INSERT 1 GRAM VAGINALLY TWICE A WEEK AT BEDTIME 43 g 0  Iron, Ferrous Sulfate, 325 (65 Fe) MG TABS Take 325 mg by mouth daily.     lidocaine  (LIDODERM ) 5 % Place 1 patch onto the skin daily. Remove & Discard patch within 12 hours or as directed by MD 14 patch 0   LORazepam  (ATIVAN ) 0.5 MG tablet Take 1 tablet (0.5 mg total) by mouth at bedtime as needed for sleep or anxiety. 30 tablet 0   Magnesium  400 MG CAPS Take by mouth.     Multiple Vitamins-Minerals (PRESERVISION AREDS PO) Take 1 capsule by mouth 2 (two) times daily.     tiZANidine (ZANAFLEX) 4 MG tablet Take by mouth.     No current facility-administered medications for this visit.    ALLERGIES: Ampicillin and Penicillins  Family History  Problem Relation Age of Onset   Hyperlipidemia Mother    Memory loss Mother        10s; believed to be age-related memory decline   COPD Father        dec age 74/s-smoked   Macular degeneration Father    Behavior problems Father        at the end of life   Diabetes Brother    Macular degeneration Brother    Suicidality Brother    Diabetes Maternal Grandmother    Heart attack Paternal Grandfather     Review of Systems  PHYSICAL EXAM:  BP 122/80 (BP Location: Left Arm, Patient Position: Sitting)   Pulse 93   Ht 5' 8.5 (1.74 m)   Wt 130 lb (59 kg)   LMP 09/26/1995   SpO2 98%   BMI 19.48 kg/m     General appearance: alert, cooperative and appears stated age Head: normocephalic, without obvious abnormality, atraumatic Neck: no adenopathy, supple, symmetrical, trachea midline and thyroid normal to inspection and palpation Lungs:  clear to auscultation bilaterally Breasts: normal appearance, no masses or tenderness, No nipple retraction or dimpling, No nipple discharge or bleeding, No axillary adenopathy Heart: regular rate and rhythm Abdomen: soft, non-tender; no masses, no organomegaly Extremities: extremities normal, atraumatic, no cyanosis or edema Skin: skin color, texture, turgor normal. No rashes or lesions Lymph nodes: cervical, supraclavicular, and axillary nodes normal. Neurologic: grossly normal  Pelvic: External genitalia:  no lesions              No abnormal inguinal nodes palpated.              Urethra:  normal appearing urethra with no masses, tenderness or lesions              Bartholins and Skenes: normal                 Vagina: normal appearing vagina with normal color and discharge, no lesions.  Minor atrophy of the left vaginal sidewall at apex.  No lesions.               Cervix: absent. Excellent anterior, apical and posterior vaginal wall support.               Pap taken: yes Bimanual Exam:  Uterus:  absent              Adnexa: no mass, fullness, tenderness              Rectal exam: yes.  Confirms.              Anus:  normal sphincter tone, no lesions  Chaperone was present for exam:  Kari HERO, CMA  ASSESSMENT: Encounter for breast and  pelvic exam.  Personal hx of other medical tx.  Status post TAH.  Status post robotically assisted bilateral salpingo-oophorectomy, sacral colpopexy, anterior and posterior colporrhaphy, TVT Exact midurethral sling and cystoscopy. Status post revision of midurethral sling. History of abnormal paps and VAIN I. Vaginal bleeding post colposcopy resolved.  Vaginal cancer screening.  Vaginal atrophy. Encounter for medication monitoring.  Hx multiple myeloma. Hx murmur.  Hx aortic regurgitation.   PLAN: Mammogram screening discussed. Self breast awareness reviewed. Pap and HRV collected:  yes Guidelines for Calcium , Vitamin D , regular exercise program  including cardiovascular and weight bearing exercise. Medication refills:  estrace  1 gm pv twice weekly.  43 grms, RF 2.  I discussed potential effect on breast cancer.  Labs with PCP.  Follow up:  yearly and prn.    Additional counseling given.  yes. 20 min  total time was spent for this patient encounter, including preparation, face-to-face counseling with the patient, coordination of care, and documentation of the encounter in addition to doing the breast and pelvic exam and pap.        "

## 2024-09-30 ENCOUNTER — Other Ambulatory Visit (HOSPITAL_COMMUNITY)
Admission: RE | Admit: 2024-09-30 | Discharge: 2024-09-30 | Disposition: A | Source: Ambulatory Visit | Attending: Obstetrics and Gynecology | Admitting: Obstetrics and Gynecology

## 2024-09-30 ENCOUNTER — Ambulatory Visit: Payer: Medicare Other | Admitting: Obstetrics and Gynecology

## 2024-09-30 ENCOUNTER — Encounter: Payer: Self-pay | Admitting: Hematology

## 2024-09-30 ENCOUNTER — Encounter: Payer: Self-pay | Admitting: Obstetrics and Gynecology

## 2024-09-30 VITALS — BP 122/80 | HR 93 | Ht 68.5 in | Wt 130.0 lb

## 2024-09-30 DIAGNOSIS — N890 Mild vaginal dysplasia: Secondary | ICD-10-CM | POA: Diagnosis present

## 2024-09-30 DIAGNOSIS — Z5181 Encounter for therapeutic drug level monitoring: Secondary | ICD-10-CM | POA: Diagnosis not present

## 2024-09-30 DIAGNOSIS — Z01419 Encounter for gynecological examination (general) (routine) without abnormal findings: Secondary | ICD-10-CM

## 2024-09-30 DIAGNOSIS — N951 Menopausal and female climacteric states: Secondary | ICD-10-CM | POA: Diagnosis not present

## 2024-09-30 DIAGNOSIS — Z1272 Encounter for screening for malignant neoplasm of vagina: Secondary | ICD-10-CM

## 2024-09-30 DIAGNOSIS — Z9289 Personal history of other medical treatment: Secondary | ICD-10-CM

## 2024-09-30 DIAGNOSIS — N952 Postmenopausal atrophic vaginitis: Secondary | ICD-10-CM

## 2024-09-30 MED ORDER — ESTRADIOL 0.01 % VA CREA: TOPICAL_CREAM | VAGINAL | 2 refills | Status: AC

## 2024-09-30 NOTE — Patient Instructions (Signed)

## 2024-10-01 ENCOUNTER — Other Ambulatory Visit: Payer: Self-pay

## 2024-10-02 LAB — CYTOLOGY - PAP
Comment: NEGATIVE
High risk HPV: NEGATIVE

## 2024-10-03 ENCOUNTER — Ambulatory Visit: Payer: Self-pay | Admitting: Obstetrics and Gynecology

## 2024-10-08 ENCOUNTER — Encounter: Payer: Self-pay | Admitting: Hematology

## 2024-10-08 ENCOUNTER — Inpatient Hospital Stay

## 2024-10-08 ENCOUNTER — Inpatient Hospital Stay: Attending: Hematology | Admitting: Hematology

## 2024-10-08 VITALS — BP 119/73 | HR 97 | Temp 97.7°F | Resp 20 | Wt 133.0 lb

## 2024-10-08 DIAGNOSIS — C9001 Multiple myeloma in remission: Secondary | ICD-10-CM

## 2024-10-08 DIAGNOSIS — Z5112 Encounter for antineoplastic immunotherapy: Secondary | ICD-10-CM

## 2024-10-08 LAB — CBC WITH DIFFERENTIAL (CANCER CENTER ONLY)
Abs Immature Granulocytes: 0.01 K/uL (ref 0.00–0.07)
Basophils Absolute: 0 K/uL (ref 0.0–0.1)
Basophils Relative: 0 %
Eosinophils Absolute: 0.1 K/uL (ref 0.0–0.5)
Eosinophils Relative: 1 %
HCT: 40.1 % (ref 36.0–46.0)
Hemoglobin: 13.6 g/dL (ref 12.0–15.0)
Immature Granulocytes: 0 %
Lymphocytes Relative: 15 %
Lymphs Abs: 0.8 K/uL (ref 0.7–4.0)
MCH: 31.9 pg (ref 26.0–34.0)
MCHC: 33.9 g/dL (ref 30.0–36.0)
MCV: 93.9 fL (ref 80.0–100.0)
Monocytes Absolute: 0.4 K/uL (ref 0.1–1.0)
Monocytes Relative: 7 %
Neutro Abs: 3.8 K/uL (ref 1.7–7.7)
Neutrophils Relative %: 77 %
Platelet Count: 258 K/uL (ref 150–400)
RBC: 4.27 MIL/uL (ref 3.87–5.11)
RDW: 14.2 % (ref 11.5–15.5)
WBC Count: 5 K/uL (ref 4.0–10.5)
nRBC: 0 % (ref 0.0–0.2)

## 2024-10-08 LAB — CMP (CANCER CENTER ONLY)
ALT: 20 U/L (ref 0–44)
AST: 20 U/L (ref 15–41)
Albumin: 4.2 g/dL (ref 3.5–5.0)
Alkaline Phosphatase: 79 U/L (ref 38–126)
Anion gap: 11 (ref 5–15)
BUN: 19 mg/dL (ref 8–23)
CO2: 29 mmol/L (ref 22–32)
Calcium: 9.3 mg/dL (ref 8.9–10.3)
Chloride: 102 mmol/L (ref 98–111)
Creatinine: 1.17 mg/dL — ABNORMAL HIGH (ref 0.44–1.00)
GFR, Estimated: 50 mL/min — ABNORMAL LOW
Glucose, Bld: 119 mg/dL — ABNORMAL HIGH (ref 70–99)
Potassium: 4.1 mmol/L (ref 3.5–5.1)
Sodium: 142 mmol/L (ref 135–145)
Total Bilirubin: 0.6 mg/dL (ref 0.0–1.2)
Total Protein: 6.4 g/dL — ABNORMAL LOW (ref 6.5–8.1)

## 2024-10-08 NOTE — Progress Notes (Signed)
 " HEMATOLOGY ONCOLOGY PROGRESS NOTE  Date of service: 10/08/2024  Patient Care Team: Seabron Lenis, MD as PCP - General (Family Medicine) Pietro, Redell RAMAN, MD as PCP - Cardiology (Cardiology) Livingston Rigg, MD as Consulting Physician (Dermatology) Cathlyn JAYSON Cary, Bobie BRAVO, MD as Consulting Physician (Obstetrics and Gynecology) Onesimo Emaline Brink, MD as Consulting Physician (Hematology) Patel, Donika K, DO as Consulting Physician (Neurology)  CHIEF COMPLAINT/PURPOSE OF CONSULTATION: Follow-up for continued evaluation and management of Multiple Myeloma  Diagnosis: Diagnosed in January 2017. IgA lambda R-ISS stage II multiple myeloma with innumerable lytic lesions in the calvarium, lesion in T7 vertebra and multiple lesions in the pelvis.    HISTORY OF PRESENTING ILLNESS: See previous notes for details on initial presentation.  SUMMARY OF ONCOLOGIC HISTORY:  Previous Treatment: Status post Vd x 1 cycle VRd x 6 cycles   HD Melphalan 200mg /m2 on 03/22/2016 Autologous HSCT on 03/23/2016 with Dr Oscar at [facility] (Dose of 6.4 x10^6/kg) Oncology History  Multiple myeloma  10/19/2015 Initial Diagnosis   Multiple myeloma   08/11/2022 -  Chemotherapy   Patient is on Treatment Plan : MYELOMA Daratumumab  SQ q28d     11/28/2023 Cancer Staging   Staging form: Plasma Cell Myeloma and Plasma Cell Disorders, AJCC 8th Edition - Clinical: High-risk cytogenetics: Absent, LDH: Normal - Signed by Onesimo Emaline Brink, MD on 11/28/2023 Stage prefix: Initial diagnosis Cytogenetics: t(11;14) translocation    Current Treatment: active surveillance.  Previous rx. Revlimid  10mg  po daily 3 weeks on 1 week off  Daratumumab  added 11/15 for loss of MRD negative status   INTERVAL HISTORY: Paula Johnson is a 69 y.o. female who is here today for continued evaluation and management of Multiple Myeloma.  she was last seen by me on 08/13/2024; at the time she did not have any concerns and was doing well.     Today, she says She and Dr. Guinevere extensively discussed her discontinuing Lenalidomide  now that she is MRD negative. Dr. Guinevere was apparently extremely concerned with the possibility of development of secondary Leukemia since patient had been on Lenalidomide  for many years, and the patient herself was concerned that if she stopped one medication and continued Revlimid  she would be at an increased risk of developing Leukemia anyway, so it was ultimately decided to halt both Lenalidomide  and Revlimid . Due to her physician's concern, she will be undergoing a bone marrow biopsy in March 2026, about 4 months since her last treatment. She does seem to be somewhat anxious and unsure about her decision following her conversation. She describes waiting for the other shoe to drop regarding the possibility of reoccurrence or secondary development.  She is willing do have monthly labs to monitor her condition since she has stopped medication.  She has noticed her appetite had increased since she stopped the medication. Otherwise, she doesn't feel any changes.   REVIEW OF SYSTEMS:   10 Point review of systems of done and is negative except as noted above.  MEDICAL HISTORY Past Medical History:  Diagnosis Date   Abnormal Pap smear of vagina 03/22/2017   ASCUS pap and negative HR HPV.  Patient is on immunosuppressive medication.    Allergic reaction to bee sting    Anemia    AR (aortic regurgitation) 11/30/2017   trace noted on ECHO   Atrial septal aneurysm per 11-30-17 echo   trivial pericardial effusion   Complicated migraine    Constipation    DDD (degenerative disc disease), cervical    DDD (degenerative disc  disease), lumbosacral    Fatty liver    Female proctocele without uterine prolapse    Fibroid    reason for Hysterectomy   FTT (failure to thrive) in adult 08/09/2017   Gastroesophageal reflux disease without esophagitis 01/17/2017   Generalized anxiety disorder    Grade I  diastolic dysfunction    Hammer toe 05/13/2018   Hematuria 11/08/2015   History of COVID-19 12/2020   History of stress incontinence    Hypergammaglobulinemia    Hyperlipidemia    Hypoxemia 12/16/2007   Insomnia    Leg length discrepancy    Loss of appetite    Lung nodule    Malignant neoplasm of thymus (HCC) 12/03/2007   Metastatic multiple myeloma to bone (HCC) 10/19/2015   Metatarsalgia of right foot 07/15/2018   MR (mitral regurgitation) 11/30/2017   trace noted on ECHO   Muscular fasciculation    Nasal septal deviation    Nasal turbinate hypertrophy    Near syncope    Obstructive sleep apnea 12/03/2007   Other fatigue    Peripheral edema    Plasma cell dyscrasia    PONV (postoperative nausea and vomiting)    thinks morphine  caused nausea   Postoperative urinary retention 05/14/2018   Prediabetes    Pure hypercholesterolemia    Rash 11/01/2015   Restless leg syndrome    S/P autologous bone marrow transplantation (HCC) 03/29/2016   Shortness of breath 12/04/2007   TR (tricuspid regurgitation) 11/30/2017   trace noted on ECHO   Urinary incontinence    UTI (urinary tract infection)    Vaginal granulation tissue 12/03/2017   Vitamin D  deficiency    Wears glasses     IMMUNIZATION HISTORY Immunization History  Administered Date(s) Administered   DTaP 05/09/2017   DTaP / IPV 07/24/2017   HIB (PRP-T) 05/09/2017, 07/24/2017   Hepatitis B, ADULT 05/09/2017, 07/24/2017   IPV 05/09/2017   Influenza Split 07/12/2022   Influenza,inj,Quad PF,6+ Mos 07/04/2016, 05/23/2019   Influenza,inj,quad, With Preservative 06/20/2015   Pneumococcal Conjugate-13 10/09/2016, 07/24/2017   Pneumococcal Polysaccharide-23 01/17/2017   Respiratory Syncytial Virus Vaccine,Recomb Aduvanted(Arexvy) 08/29/2022    SURGICAL HISTORY Past Surgical History:  Procedure Laterality Date   ABDOMINAL HYSTERECTOMY  1997   TAH--ovaries remain--Dr. Debby Lares   ANTERIOR AND POSTERIOR REPAIR N/A  05/14/2018   Procedure: ANTERIOR (CYSTOCELE) AND POSTERIOR REPAIR (RECTOCELE);  Surgeon: Cathlyn JAYSON Nikki Bobie FORBES, MD;  Location: [facility];  Service: Gynecology;  Laterality: N/A;   BLADDER SUSPENSION N/A 05/14/2018   Procedure: TRANSVAGINAL TAPE (TVT) PROCEDURE exact midurethral sling;  Surgeon: Cathlyn JAYSON Nikki Bobie FORBES, MD;  Location: [facility];  Service: Gynecology;  Laterality: N/A;   BONE MARROW TRANSPLANT  2017   done at [facility]   BREAST BIOPSY     COLONOSCOPY     CYSTOSCOPY N/A 05/14/2018   Procedure: CYSTOSCOPY;  Surgeon: Cathlyn JAYSON Nikki Bobie FORBES, MD;  Location: [facility];  Service: Gynecology;  Laterality: N/A;   CYSTOSCOPY N/A 05/30/2018   Procedure: CYSTOSCOPY;  Surgeon: Cathlyn JAYSON Nikki Bobie FORBES, MD;  Location: [facility];  Service: Gynecology;  Laterality: N/A;   ROBOTIC ASSISTED LAPAROSCOPIC SACROCOLPOPEXY N/A 05/14/2018   Procedure: XI ROBOTIC ASSISTED LAPAROSCOPIC SACROCOLPOPEXY;  Surgeon: Cathlyn JAYSON Nikki Bobie FORBES, MD;  Location: [facility];  Service: Gynecology;  Laterality: N/A;  6 hours OR time. Need extended stay recovery bed.   SALPINGOOPHORECTOMY Bilateral 05/14/2018   Procedure: SALPINGO OOPHORECTOMY;  Surgeon: Cathlyn JAYSON Nikki Bobie FORBES, MD;  Location: [facility];  Service: Gynecology;  Laterality: Bilateral;   THYMECTOMY  1999   TRANSVAGINAL TAPE (TVT) REMOVAL N/A 05/30/2018   Procedure: TRANSVAGINAL TAPE (TVT)  Revision;  Surgeon: Cathlyn JAYSON Nikki Bobie FORBES, MD;  Location: [facility];  Service: Gynecology;  Laterality: N/A;   WISDOM TOOTH EXTRACTION      SOCIAL HISTORY Social History[1]  Social History   Social History Narrative   Lives with husband   Caffeine- coffee, 2 cups daily   Right handed      Are you right handed or left handed? Right Handed   Are you currently employed ? No    What is your current occupation?   Do you live at home alone? No    Who lives with you? Husband    What type of home do you live in: 1 story or 2 story? Lives in a  one story home with a basement.         SOCIAL DRIVERS OF HEALTH SDOH Screenings   Housing: Unknown (08/02/2024)   Received from [facility]  Depression [phone]): Low Risk (08/13/2024)  Tobacco Use: Low Risk (09/30/2024)     FAMILY HISTORY Family History  Problem Relation Age of Onset   Hyperlipidemia Mother    Memory loss Mother        13s; believed to be age-related memory decline   COPD Father        dec age 43/s-smoked   Macular degeneration Father    Behavior problems Father        at the end of life   Diabetes Brother    Macular degeneration Brother    Suicidality Brother    Diabetes Maternal Grandmother    Heart attack Paternal Grandfather      ALLERGIES: is allergic to ampicillin and penicillins.  MEDICATIONS  Current Outpatient Medications  Medication Sig Dispense Refill   acyclovir  (ZOVIRAX ) 400 MG tablet Take 1 tablet by mouth twice daily 60 tablet 0   Ascorbic Acid (VITAMIN C) 1000 MG tablet Take 1,000 mg by mouth 2 (two) times daily.     aspirin  EC 81 MG tablet Take 81 mg by mouth daily.     b complex vitamins capsule Take 1 capsule by mouth daily.     Cholecalciferol  (VITAMIN D3) 30 MCG/15ML LIQD Take by mouth. Pt. Takes 5000 IU daily     Cyanocobalamin (B-12) 1000 MCG CAPS      EPINEPHrine  0.3 mg/0.3 mL IJ SOAJ injection INJECT INTRAMUSCULARLLY AS DIRECTED AS NEEDED FOR ANAPHYLAXIS     estradiol  (ESTRACE ) 0.01 % CREA vaginal cream INSERT 1 GRAM VAGINALLY TWICE A WEEK AT BEDTIME 43 g 2   Iron, Ferrous Sulfate, 325 (65 Fe) MG TABS Take 325 mg by mouth daily.     lidocaine  (LIDODERM ) 5 % Place 1 patch onto the skin daily. Remove & Discard patch within 12 hours or as directed by MD 14 patch 0   LORazepam  (ATIVAN ) 0.5 MG tablet Take 1 tablet (0.5 mg total) by mouth at bedtime as needed for sleep or anxiety. 30 tablet 0   Magnesium  400 MG CAPS Take by mouth.     Multiple Vitamins-Minerals (PRESERVISION AREDS PO) Take 1 capsule by mouth 2 (two)  times daily.     tiZANidine (ZANAFLEX) 4 MG tablet Take by mouth.     No current facility-administered medications for this visit.    PHYSICAL EXAMINATION: ECOG PERFORMANCE STATUS: 0 - Asymptomatic VITALS: Vitals:   10/08/24 1433  BP: 119/73  Pulse: 97  Resp: 20  Temp: 97.7 F (36.5 C)   Filed Weights   10/08/24 1433  Weight: 133 lb (60.3 kg)   Body mass index is 19.93 kg/m.  GENERAL: alert, in no acute distress and comfortable SKIN: no acute rashes, no significant lesions EYES: conjunctiva are pink and non-injected, sclera anicteric OROPHARYNX: MMM, no exudates, no oropharyngeal erythema or ulceration NECK: supple, no JVD LYMPH:  no palpable lymphadenopathy in the cervical, axillary or inguinal regions LUNGS: clear to auscultation b/l with normal respiratory effort HEART: regular rate & rhythm ABDOMEN:  normoactive bowel sounds , non tender, not distended, no hepatosplenomegaly Extremity: no pedal edema PSYCH: alert & oriented x 3 with fluent speech NEURO: no focal motor/sensory deficits  LABORATORY DATA:   I have reviewed the data as listed     Latest Ref Rng & Units 10/08/2024    2:03 PM 09/15/2024   10:16 AM 08/13/2024    1:48 PM  CBC EXTENDED  WBC 4.0 - 10.5 K/uL 5.0  3.8  4.1   RBC 3.87 - 5.11 MIL/uL 4.27  4.54  4.39   Hemoglobin 12.0 - 15.0 g/dL 86.3  85.4  86.0   HCT 36.0 - 46.0 % 40.1  42.1  41.3   Platelets 150 - 400 K/uL 258  258  274   NEUT# 1.7 - 7.7 K/uL 3.8  2.6  2.4   Lymph# 0.7 - 4.0 K/uL 0.8  0.8  0.7        Latest Ref Rng & Units 10/08/2024    2:05 PM 09/15/2024   10:20 AM 07/18/2024    2:50 PM  CMP  Glucose 70 - 99 mg/dL 880  96  866   BUN 8 - 23 mg/dL 19  18  17    Creatinine 0.44 - 1.00 mg/dL 8.82  9.18  8.98   Sodium 135 - 145 mmol/L 142  141  141   Potassium 3.5 - 5.1 mmol/L 4.1  4.1  3.9   Chloride 98 - 111 mmol/L 102  104  106   CO2 22 - 32 mmol/L 29  27  28    Calcium  8.9 - 10.3 mg/dL 9.3  9.6  9.2   Total Protein 6.5 - 8.1  g/dL 6.4  7.1  6.5   Total Bilirubin 0.0 - 1.2 mg/dL 0.6  1.1  0.8   Alkaline Phos 38 - 126 U/L 79  85  92   AST 15 - 41 U/L 20  21  17    ALT 0 - 44 U/L 20  24  25     Component     Latest Ref Rng 05/21/2024 06/18/2024  IgG (Immunoglobin G), Serum     586 - 1,602 mg/dL 372  437 (L)   IgA     87 - 352 mg/dL 884  881   IgM (Immunoglobulin M), Srm     26 - 217 mg/dL 22 (L)  25 (L)   Total Protein ELP     6.0 - 8.5 g/dL 6.4 (C) 6.2 (C)  Albumin  SerPl Elph-Mcnc     2.9 - 4.4 g/dL 3.7 (C) 3.7 (C)  Alpha 1     0.0 - 0.4 g/dL 0.3 (C) 0.3 (C)  Alpha2 Glob SerPl Elph-Mcnc     0.4 - 1.0 g/dL 0.8 (C) 0.8 (C)  B-Globulin SerPl Elph-Mcnc     0.7 - 1.3 g/dL 1.1 (C) 1.0 (C)  Gamma Glob SerPl Elph-Mcnc     0.4 - 1.8 g/dL 0.5 (C) 0.5 (  C)  M Protein SerPl Elph-Mcnc     Not Observed g/dL Not Observed (C) Not Observed (C)  Globulin, Total     2.2 - 3.9 g/dL 2.7 (C) 2.5 (C)  Albumin /Glob SerPl     0.7 - 1.7  1.4 (C) 1.5 (C)  IFE 1 The immunofixation pattern appears unremarkable. Evidence of  monoclonal protein is not apparent.  The immunofixation pattern appears unremarkable. Evidence of  monoclonal protein is not apparent.    PREVIOUS STUDIES:  10/10/2019 Flow Cytometry (Repeat BM Bx from [facility]):   10/07/18 Repeat BM Bx from [facility]:   Surgical Pathology                                Case: [id number]                               07/04/2022 1015             [facility]                                                                     A-C. Peripheral blood and bone marrow (peripheral smear, aspirate, touch preparation, core biopsy, and clot section): - Low-level plasma cell neoplasm, see comment.   COMMENT: The core biopsy is 40% cellular. Cyclin-D1 immunohistochemical stain demonstrates very rare positive plasma cells (1%). Concurrent flow cytometry study ([id number]) shows a plasma cell population with an atypical immunophenotype, comprising approximately 0.04% of analyzed  events.     Microscopic Examination              Lab Results  Component Value Date    WBC 5.2 07/04/2022    HGB 12.1 07/04/2022    PLT 364 07/04/2022    MCV 95 07/04/2022    RDWCV 14.6 (H) 07/04/2022    Peripheral blood and automated blood count:  RBCs: The red blood cells are adequate in number,  normocytic and normochromic, with minimal anisopoikilocytosis. WBCs: The white blood cells are adequate in number and show unremarkable morphology. Platelets: The platelets are adequate in number and show unremarkable morphology.   Bone marrow aspirate:  There are adequate cellular particles. M:E ratio is 3:1.  Granulopoiesis: Maturing myeloid elements are present and show complete maturation. Erythropoiesis:  Erythroid elements are present and show complete maturation. Megakaryocytes: Megakaryocytes are adequate in number and show unremarkable morphology.  There are slightly increased plasma cells with unremarkable morphology.   Bone marrow touch preparation: Adequate marrow elements are present, without additional findings to the aspirate smears.   Prussian blue stained aspirate smear: 0 of 4 stainable iron is present. No ringed sideroblasts are identified.   Bone marrow core biopsy:  The core biopsy is adequate for evaluation. The cellularity is 40%. Maturing myeloid elements are present and show complete maturation. Maturing erythroid elements are present and show complete maturation.  Megakaryocytes are adequate in number and show unremarkable morphology. There are slightly increased plasma cells.   A reticulin stain of the core biopsy shows no significant reticulin fibrosis.   Aspirate clot section: There are adequate particles present, without additional findings to the core biopsy.  Immunohistochemical stains for Cyclin-D1, CD138, Kappa CISH, and Lambda CISH are performed on the core biopsy section (block B1). CD138, kappa, and lambda in-situ hybridization demonstrates 5-10%  predominantly polytypic plasma cells. Cyclin-D1 demonstrates very rare positive plasma cells (1%).   Bone Marrow Differential     500 CELL DIFF   Cell  Type Cell Cts Ref Range Cell  Type Cell Count Ref  Range Cell Type Cell Count Ref Range  Segs 23 (7-25) Lymphocytes 15 (3-20) Plasma Cells 8 (0-3.5)  Bands 3 (6-36)     Atypicals 0   RBC Precursors 22 (10-30)  Metas 2 (9-25) Lymphoblasts *   Pronormoblasts 0 (0-3)  Myelos 5 (8-15) Eosinophils 12 (0-4) M:E Ratio 3/1    Promyelos 1 (1-6) Basophils 0 (0-1) Iron 0 (Scale 0-4+)  Myeloblasts 1 (0-3.5) Monocytes 8 (0-2)          Pathology - Bone Marrow Order: [id number] Surgical Pathology                                 Case: [id number]                               07/04/2022  A-C. Peripheral blood and bone marrow (peripheral smear, aspirate, touch preparation, core biopsy, and clot section): - Low-level plasma cell neoplasm, see comment.   COMMENT: The core biopsy is 40% cellular. Cyclin-D1 immunohistochemical stain demonstrates very rare positive plasma cells (1%). Concurrent flow cytometry study ([id number]) shows a plasma cell population with an atypical immunophenotype, comprising approximately 0.04% of analyzed events.   Results for orders placed or performed in visit on 08/01/23 [facility] Hgb 14.3 wbc 3.5 plts 246 Creatinine 1 calcium  10 SPEP IFE IgG kappa FLC 2.12 2.01 1.05 Total IgG 590 BMBX 1% Cyclin D1% MRD 0.01%        RADIOGRAPHIC STUDIES: I have personally reviewed the radiological images as listed and agreed with the findings in the report. No results found.  ASSESSMENT & PLAN:  69 y.o. female with  #IgA lambda R-ISS stage II multiple myeloma with innumerable lytic lesions in the calvarium, lesion in T7 vertebra and multiple lesions in the pelvis.  --Diagnosed in January 2017. --Status post Vd x 1 cycle, VRd x 6 cycles --Underwent HD Melphalan 200mg /m2 on 03/22/2016 --Undewent Autologous HSCT on 03/23/2016  with Dr Oscar at [facility] (Dose of 6.4 x10^6/kg) --Underwent bone marrow biopsy on 10/07/18 cytometry indicate that the pt continues to be in CR --10/10/2019 Flow Cytometry revealed A. Bone Marrow (Plasma Cell Myeloma Minimal Residual Disease Detection by Flow Cytometry): Negative. No phenotypically abnormal plasma cells at or above the limit of detection identified.  PLAN: - Discussed lab results on 10/08/2024 in detail with patient: CBC showed WBC of 5K, Hemoglobin of 13.6, and PLTs of 258K CMP with Creatinine 1.17 increased from 0.81.  Multiple Myeloma panel from September 2025 stable.  - Discussed with her regarding the decision to halt her medication as per her discussion/decision with Dr Oscar. -monthly labs which need to be forwarded to Dr Oscar at [facility]  FOLLOW-UP in 4 months for labs and follow-up with Dr. Onesimo.  The total time spent in the appointment was 21 minutes* .  All of the patient's questions were answered and the patient knows to call the clinic with any problems, questions, or concerns.  Emaline Onesimo MD MS AAHIVMS [facility] Hematology/Oncology  Physician [facility]  *Total Encounter Time as defined by the Centers for Medicare and Medicaid Services includes, in addition to the face-to-face time of a patient visit (documented in the note above) non-face-to-face time: obtaining and reviewing outside history, ordering and reviewing medications, tests or procedures, care coordination (communications with other health care professionals or caregivers) and documentation in the medical record.  I,Emily Lagle,acting as a neurosurgeon for Emaline Saran, MD.,have documented all relevant documentation on the behalf of Emaline Saran, MD,as directed by  Emaline Saran, MD while in the presence of Emaline Saran, MD.  I have reviewed the above documentation for accuracy and completeness, and I agree with the above.  Emaline Saran, MD     [1]  Social History Tobacco Use    Smoking status: Never   Smokeless tobacco: Never  Vaping Use   Vaping status: Never Used  Substance Use Topics   Alcohol use: Not Currently    Alcohol/week: 0.0 standard drinks of alcohol   Drug use: Never   "

## 2024-10-10 ENCOUNTER — Other Ambulatory Visit: Payer: Self-pay | Admitting: Hematology

## 2024-10-10 DIAGNOSIS — C9001 Multiple myeloma in remission: Secondary | ICD-10-CM

## 2024-10-11 LAB — MULTIPLE MYELOMA PANEL, SERUM
Albumin SerPl Elph-Mcnc: 3.7 g/dL (ref 2.9–4.4)
Albumin/Glob SerPl: 1.8 — ABNORMAL HIGH (ref 0.7–1.7)
Alpha 1: 0.1 g/dL (ref 0.0–0.4)
Alpha2 Glob SerPl Elph-Mcnc: 0.7 g/dL (ref 0.4–1.0)
B-Globulin SerPl Elph-Mcnc: 0.9 g/dL (ref 0.7–1.3)
Gamma Glob SerPl Elph-Mcnc: 0.4 g/dL (ref 0.4–1.8)
Globulin, Total: 2.1 g/dL — ABNORMAL LOW (ref 2.2–3.9)
IgA: 83 mg/dL — ABNORMAL LOW (ref 87–352)
IgG (Immunoglobin G), Serum: 506 mg/dL — ABNORMAL LOW (ref 586–1602)
IgM (Immunoglobulin M), Srm: 17 mg/dL — ABNORMAL LOW (ref 26–217)
Total Protein ELP: 5.8 g/dL — ABNORMAL LOW (ref 6.0–8.5)

## 2024-10-13 ENCOUNTER — Encounter: Payer: Self-pay | Admitting: Hematology

## 2024-10-15 ENCOUNTER — Encounter: Payer: Self-pay | Admitting: Hematology

## 2024-11-05 ENCOUNTER — Inpatient Hospital Stay: Attending: Hematology

## 2024-12-03 ENCOUNTER — Inpatient Hospital Stay: Attending: Hematology

## 2024-12-31 ENCOUNTER — Inpatient Hospital Stay: Attending: Hematology

## 2025-01-21 ENCOUNTER — Inpatient Hospital Stay: Admitting: Hematology

## 2025-10-01 ENCOUNTER — Encounter: Admitting: Obstetrics and Gynecology
